# Patient Record
Sex: Female | Born: 1940 | Race: Black or African American | Hispanic: No
Health system: Southern US, Community
[De-identification: ages and names within clinical notes are randomized; demographics above are authoritative.]

## PROBLEM LIST (undated history)

## (undated) HISTORY — PX: TRIGGER FINGER RELEASE: SHX641

## (undated) HISTORY — PX: ROTATOR CUFF REPAIR: SHX139

## (undated) HISTORY — PX: BACK SURGERY: SHX140

## (undated) HISTORY — PX: APPENDECTOMY: SHX54

## (undated) HISTORY — PX: OTHER SURGICAL HISTORY: SHX169

## (undated) HISTORY — PX: CYST EXCISION: SHX5701

## (undated) HISTORY — PX: HIP SURGERY: SHX245

---

## 2004-06-26 ENCOUNTER — Encounter: Admission: RE | Admit: 2004-06-26 | Discharge: 2004-06-26 | Payer: Self-pay | Admitting: Neurosurgery

## 2004-07-20 ENCOUNTER — Encounter: Admission: RE | Admit: 2004-07-20 | Discharge: 2004-07-20 | Payer: Self-pay | Admitting: Orthopaedic Surgery

## 2004-12-31 ENCOUNTER — Encounter: Admission: RE | Admit: 2004-12-31 | Discharge: 2004-12-31 | Payer: Self-pay | Admitting: Orthopaedic Surgery

## 2005-03-14 ENCOUNTER — Ambulatory Visit: Payer: Self-pay | Admitting: Physical Medicine & Rehabilitation

## 2005-03-14 ENCOUNTER — Inpatient Hospital Stay (HOSPITAL_COMMUNITY): Admission: RE | Admit: 2005-03-14 | Discharge: 2005-03-20 | Payer: Self-pay | Admitting: Orthopaedic Surgery

## 2006-09-24 IMAGING — CR DG CHEST 2V
2 series · 2 of 2 positions shown · non-contrast
Comparison: None.

CLINICAL DATA: Preadmission chest x-ray for right hip osteoarthritis.
 CHEST - 2 VIEW ? 03/07/05:

[view not recorded (1 of 2)]
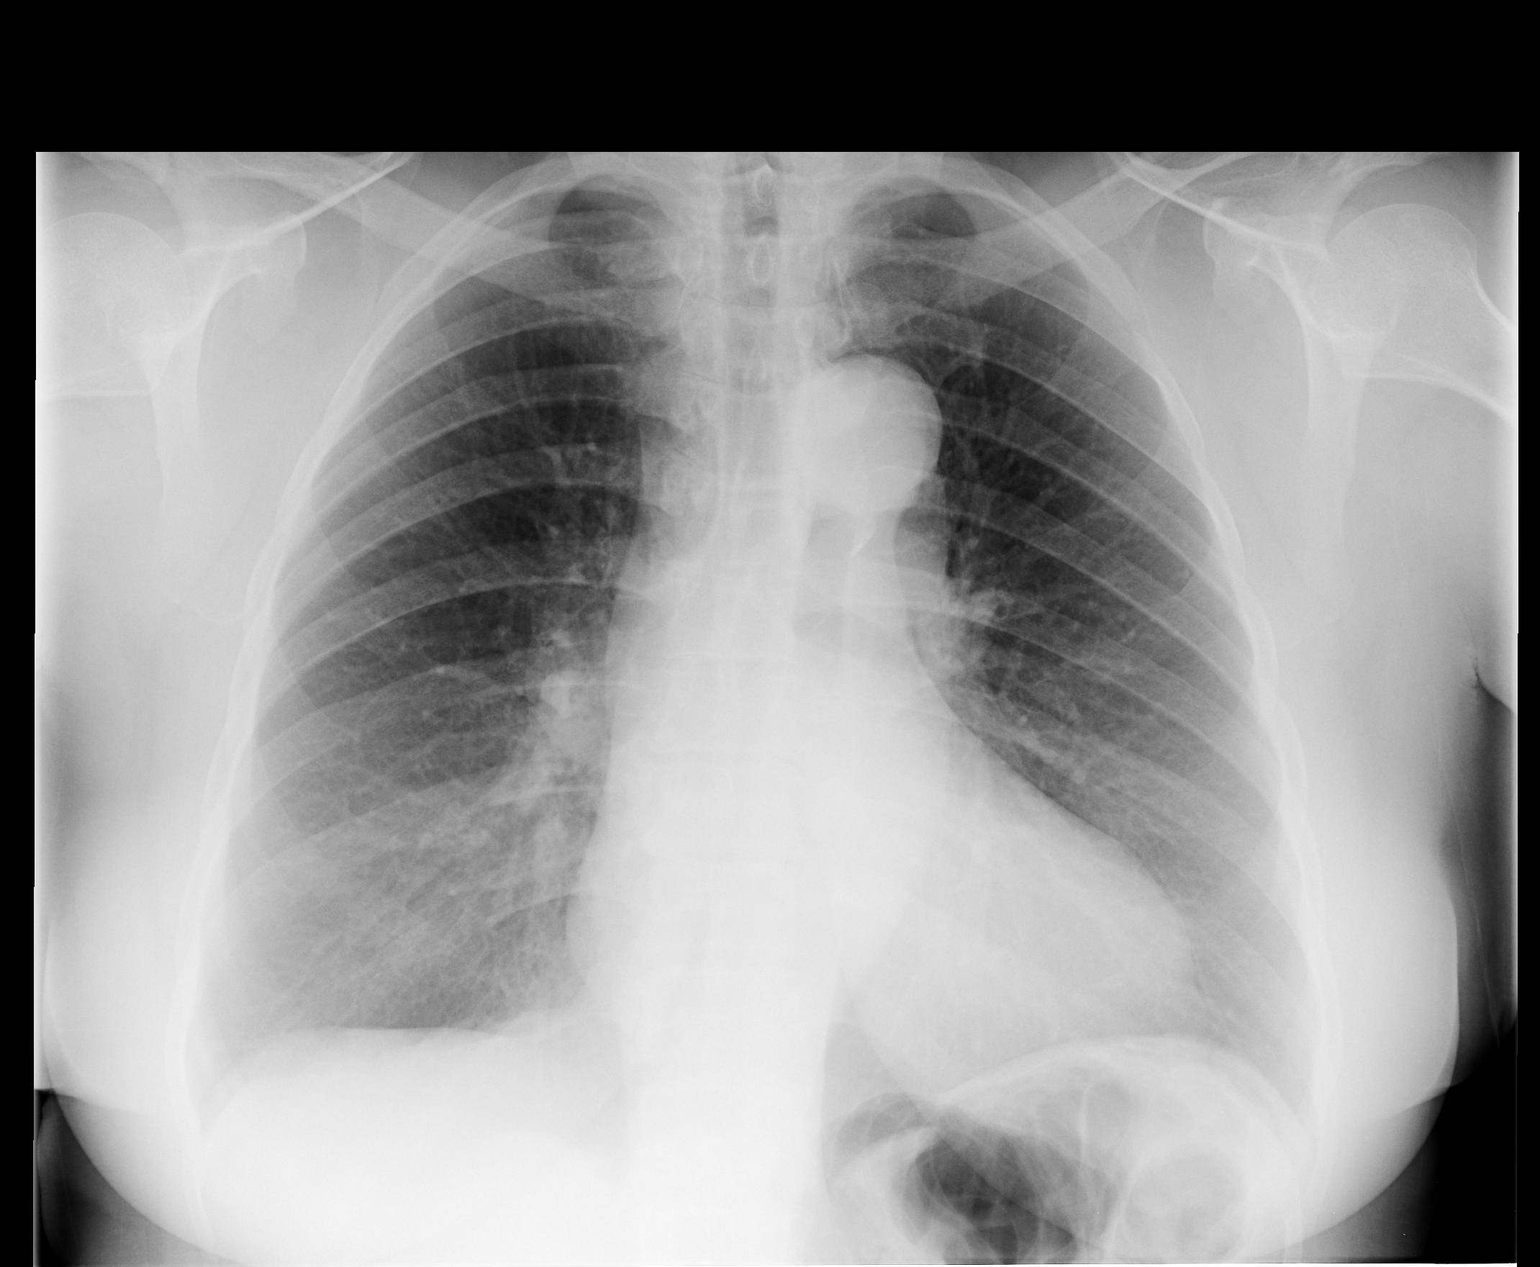

[view not recorded (2 of 2)]
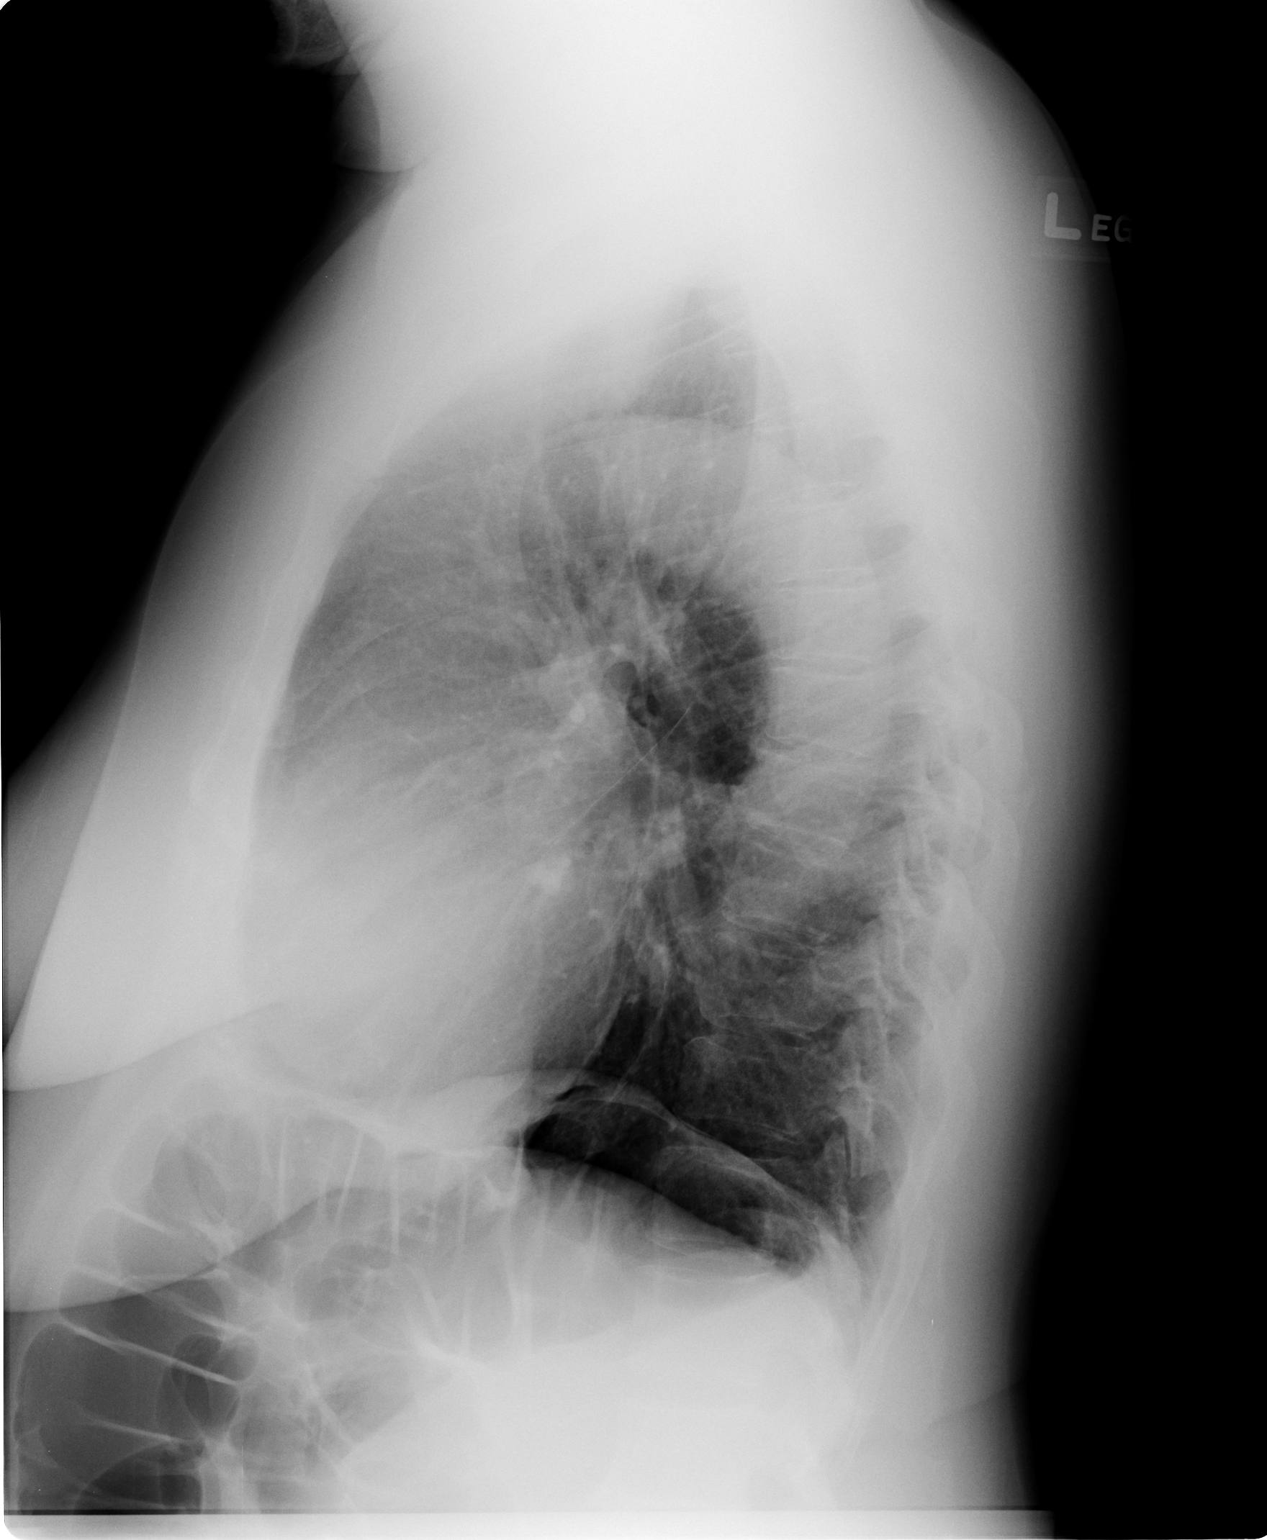

[2 of 2 positions shown; findings below may reference images not displayed]

FINDINGS: The cardiopericardial silhouette is borderline enlarged.  There is diffuse interstitial coarsening without focal consolidation, edema, or pleural effusion.  The bony structures of the visualized thorax are intact.
IMPRESSION: Chronic interstitial changes without focal consolidation or pulmonary edema.

## 2006-10-02 ENCOUNTER — Ambulatory Visit (HOSPITAL_BASED_OUTPATIENT_CLINIC_OR_DEPARTMENT_OTHER): Admission: RE | Admit: 2006-10-02 | Discharge: 2006-10-02 | Payer: Self-pay | Admitting: Orthopaedic Surgery

## 2007-03-13 DIAGNOSIS — G4733 Obstructive sleep apnea (adult) (pediatric): Secondary | ICD-10-CM | POA: Insufficient documentation

## 2007-03-13 DIAGNOSIS — I10 Essential (primary) hypertension: Secondary | ICD-10-CM | POA: Insufficient documentation

## 2007-03-13 DIAGNOSIS — K219 Gastro-esophageal reflux disease without esophagitis: Secondary | ICD-10-CM | POA: Insufficient documentation

## 2007-03-13 DIAGNOSIS — R011 Cardiac murmur, unspecified: Secondary | ICD-10-CM | POA: Insufficient documentation

## 2007-04-13 DIAGNOSIS — R519 Headache, unspecified: Secondary | ICD-10-CM | POA: Insufficient documentation

## 2007-05-05 DIAGNOSIS — J310 Chronic rhinitis: Secondary | ICD-10-CM | POA: Insufficient documentation

## 2007-06-18 DIAGNOSIS — E785 Hyperlipidemia, unspecified: Secondary | ICD-10-CM | POA: Insufficient documentation

## 2007-07-03 DIAGNOSIS — R001 Bradycardia, unspecified: Secondary | ICD-10-CM | POA: Insufficient documentation

## 2007-08-28 DIAGNOSIS — N951 Menopausal and female climacteric states: Secondary | ICD-10-CM | POA: Insufficient documentation

## 2007-10-26 ENCOUNTER — Encounter: Admission: RE | Admit: 2007-10-26 | Discharge: 2007-10-26 | Payer: Self-pay | Admitting: Orthopaedic Surgery

## 2007-11-19 DIAGNOSIS — M25519 Pain in unspecified shoulder: Secondary | ICD-10-CM | POA: Insufficient documentation

## 2008-01-08 ENCOUNTER — Encounter: Admission: RE | Admit: 2008-01-08 | Discharge: 2008-01-08 | Payer: Self-pay | Admitting: Orthopaedic Surgery

## 2009-04-17 ENCOUNTER — Encounter: Admission: RE | Admit: 2009-04-17 | Discharge: 2009-04-17 | Payer: Self-pay | Admitting: Neurosurgery

## 2010-08-21 NOTE — Op Note (Signed)
Jamie [name]           ACCOUNT NO.:  [id number]   MEDICAL RECORD NO.:  [id number]          PATIENT TYPE:  AMB   LOCATION:  DSC                          FACILITY:  [facility]   PHYSICIAN:  Claude Manges. Whitfield, M.D.DATE OF BIRTH:  1941/01/18   DATE OF PROCEDURE:  10/02/2006  DATE OF DISCHARGE:                               OPERATIVE REPORT   PREOPERATIVE DIAGNOSIS:  1. Rotator cuff tear, left shoulder, with impingement.  2. 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   POSTOPERATIVE DIAGNOSIS:  1. Rotator cuff tear, left shoulder, with impingement.  2. 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  3. Osteocartilaginous loose body left shoulder joint and complete tear      of biceps tendon.   PROCEDURE:  1. Arthroscopic debridement left shoulder joint with synovectomy and      removal of loose body.  2. Arthroscopic subacromial decompression.  3. Arthroscopic distal clavicle resection.  4. Mini-open rotator cuff tear repair.   SURGEON:  Claude Manges. Cleophas Dunker, M.D.   ASSISTANT:  Legrand Pitts. Duffy, P.A.-C.   ANESTHESIA:  General with interscalene nerve block.   COMPLICATIONS:  None.   HISTORY:  70 year old female was recently seen in the office with about  a 6-7 month history of left shoulder pain.  There was no history of  injury or trauma.  She had been evaluated in [place], [place],  with an MRI scan in January revealing a complete full thickness tear of  the supraspinatus tendon with retraction, tendinosis of the  infraspinatus tendon, hypertrophic changes of the [facility] joint.  With  persistent discomfort and some weakness, but able to raise her arm  overhead, she is now to have an arthroscopic evaluation and probable  rotator cuff tear repair if repairable.   DESCRIPTION OF PROCEDURE:  With the patient comfortable on the operating  table under general orotracheal anesthesia with a supplemental  interscalene nerve block, the patient was placed in the semi-sitting  position with the shoulder frame.  The left shoulder was then prepped  with DuraPrep from the base of neck circumferentially to below the  elbow.  Sterile draping was performed.  A marking pen was used to  outline the [facility] joint, the coracoid, and the acromion.  At a point 1  fingerbreadth posterior and medial to the angle of the acromion, a small  stab wound was made and the arthroscope easily placed in the shoulder  joint.  There was diffuse synovitis which I debrided through a second  portal established anteriorly with a cannula.  The biceps tendon was not  visible.  I did not see a labral tear.  There was a large loose body  that was about 1 cm in diameter that I was able to retrieve with the  basket forceps.  There was obvious complete rotator cuff tear as I could  visualize the subacromial space.   After further debridement, I then placed the scope in the subacromial  space and the cannula in the subacromial space anteriorly and a third  portal was established  in the lateral subacromial space.  There was  considerable bursal material and beefy red synovial tissue that was  debrided with the ArthroCare wand and the shaver.  There was obvious  anterior overhang of the acromion and anterior inferior acromioplasty  was performed with a 6 mm bur.  There was considerable degenerative  change and hypertrophic changes of the [facility] joint and distal clavicle  resection was performed with a 6 mm bur with a very nice decompression.   With an obvious rotator cuff tear, open exploration was performed.  About 1 1/2 inch incision was made along the anterior aspect of the  shoulder and via sharp dissection, carried down to the subcutaneous  tissue.  Gross bleeders were Bovie coagulated.  The deltoid fascia was  incised along its fibers and separated so that I could visualize the  subacromial space. There was still some bursal material  that I debrided.  The cuff tear was noted. It was in a V-shaped fashion, the tip of the V  at the glenoid then extending down over the humeral head.  There were  numerous hypertrophic osteophytes that were resected with good bleeding  bone.  I then repaired end to and of the cuff with #1 Ethilon.  I had a  very nice repair and there did not appear to be any tension.  The biceps  tendon was not visualized.   I copiously irrigated the operative site with saline.  I checked to be  sure I had a nice subacromial decompression.  I then closed the wound.  The deltoid fascia was closed with a running 0 Vicryl, subcu with 2-0  Vicryl, the skin closed with staples.  A sterile bulky dressing was  applied followed by sling.   PLAN:  Outpatient, Percocet for pain, office one week.      Claude Manges. Cleophas Dunker, M.D.  Electronically Signed     PWW/MEDQ  D:  10/02/2006  T:  10/02/2006  Job:  [id number]
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
## 2010-08-24 NOTE — Discharge Summary (Signed)
Jamie [name], Jamie [name]           ACCOUNT NO.:  [id number]   MEDICAL RECORD NO.:  [id number]          PATIENT TYPE:  INP   LOCATION:  5041                         FACILITY:  [facility]   PHYSICIAN:  Jamie [name]. Whitfield, M.D.DATE OF BIRTH:  [birth year]   DATE OF ADMISSION:  03/14/2005  DATE OF DISCHARGE:  03/20/2005                                 DISCHARGE SUMMARY   ADMISSION DIAGNOSES:  1.  End stage osteoarthritis right hip.  2.  Hypertension.  3.  Hiatal hernia.  4.  Gastroesophageal reflux disease.  5.  Fibromyalgia.  6.  Bilateral carpal tunnel syndrome.  7.  Heart murmur.  8.  Internal and external hemorrhoids.  9.  Broken tooth right lower jaw line.   DISCHARGE DIAGNOSIS:  1.  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  2.  Acute blood loss anemia secondary to surgery.  3.  Pyrexia now resolved.  4.  Constipation.  5.  Hypertension.  6.  Hiatal hernia.  7.  Gastroesophageal reflux disease.  8.  Fibromyalgia.  9.  Bilateral carpal tunnel syndrome.  10. Heart murmur.  11. Internal and external hemorrhoids.  12. Broken tooth right lower jaw line.   SURGICAL PROCEDURES:  March 14, 2005, Ms. Boucher underwent a right total  hip arthroplasty by Dr. Claude [name]. Whitfield, assisted by Dr. Bearl Mulberry  and Jacqualine Code, P.A.-C.  She had a Pinnacle Marathon acetabular liner  plus 4 10 degrees 36 mm inner diameter of 56 mm outer diameter with a  Pinnacle 100 series acetabular cup size 56 mm and apex hole eliminator, an  AML small stature of femoral stem 155 mm length 43 mm offset size 15 with an  Articulose metal on metal femoral head 36 mm, plus 1.5 neck, 12/14 cone.   COMPLICATIONS:  None.   CONSULTATIONS:  1.  Pharmacy consult for Coumadin therapy March 14, 2005.  2.  Case management and physical therapy consult March 15, 2005.  3.  Occupational therapy consult on March 19, 2005.  4.  Rehab medicine consult March 15, 2005.   HISTORY OF PRESENT ILLNESS:  This 70 year old black female patient presented  to Dr. Cleophas [name] with a one year history of sudden onset right hip pain.  It  is now constant aching to stabbing sensation over the right groin and thigh  with radiation to her knee.  It increases with excessive activity and  decreases with rest.  She has failed conservative treatment and because of  that she is presenting for right hip replacement.   HOSPITAL COURSE:  [name] tolerated her surgical procedure well without  immediate postoperative complications.  She was transferred to 5000.  On  postop day one a T-max was 101.4, vitals were stable.  Hemoglobin 11.2,  hematocrit 31.9.  She was started on aggressive pulmonary toilet, vitals  were monitored, and she was started on therapy per protocol.   Postop day two, pain controlled with meds.  T-max 102.1, white count 8.5,  hemoglobin 10.3, hematocrit 29.3.  Right hip incision was well approximated  with staples and minimal  drainage.  She was switched to p.o. pain meds and  continued on therapy.  She did well over the next several days.  Temperature  gradually curved downward.  She did not require any transfusion.  She did  have some difficulty with constipation that was treated with a laxative.  She was doing too well for rehab so plans were made for her discharge home  and she was eventually discharged home on March 20, 2005.   DISCHARGE INSTRUCTIONS:  Diet: She can resume her regular prehospitalization  diet.   MEDICATIONS:  She may resume her prehospitalization meds except no Coumadin.  Home meds included  1.  Lisinopril/hydrochlorothiazide 10/12.5 mg p.o. q.p.m..  2.  Plendil 10 mg p.o. q.a.m.Marland Kitchen  3.  Prilosec 20 mg p.o. q.a.m.Marland Kitchen  4.  Mylanta of 15-30 mL p.o. q.4h. p.r.n..  5.  Garleke one tablet p.o. q.a.m..  #.  Calcium citrate plus D one tablet p.o. q.a.m.  1.  Vitamin C/Rose hips 500 mg p.o. q.a.m..  2.  One Source 50 Plus one tablet  p.o. q.a.m.Marland Kitchen  3.  Lesafen one tablet p.o. q.a.m.  Additional meds at this time include  1.  Coumadin taken as directed by pharmacy at 6:00 p.m. for one she was      given 10 mg for December 14 and as directed by the pharmacy and she was      given prescription for 5 mg tablets.  2.  Percocet 5/325 mg 1-2 p.o. q.4h. p.r.n. for pain.  3.  Robaxin 500 mg 1 tablet p.o. q.6-8h. p.r.n. for spasms.   ACTIVITY:  She can be out of bed partial weightbearing 50% or less on the  right leg with use of walker.  She is to have PT per [facility].  Please see the blue total hip discharge sheet for further activity  instructions.   WOUND CARE:  She may shower after no drainage from the wound for two days.  Please see the blue total hip discharge sheet for further wound care  instructions.   FOLLOW UP:  She needs to follow up with Dr. Cleophas [name] in our office in  approximately 2 weeks and needs to call [phone] for that appointment.   LABORATORY DATA:  X-ray taken of the right hip after surgery on December 7  showed good position of the right hip replacement.  Hemoglobin and  hematocrit ranged from 13.5 and 39.2 on November 30 to a low of 8.7 and 24.8  on the December 11 to 9.5 and 27.8 on March 19, 2005.  PT and INR ranged  from 14 and 1.1 on November  30 to 15 and 1.2 on December 13.  The sodium dropped to a low of 133 on  December 9, otherwise it was within normal limits.  Glucose ranged from 104  on November 30 to a high of 159 on December 9.  Calcium ranged from 9.7 on  November 30 to 8.3 on December 9.  All other laboratory studies were within  normal limits.      Jamie [name], P.A.      Jamie [name]. Jamie [name], M.D.  Electronically Signed    KED/MEDQ  D:  06/06/2005  T:  06/06/2005  Job:  [id number]

## 2010-08-24 NOTE — Op Note (Signed)
[name], [name]           ACCOUNT NO.:  [id number]   MEDICAL RECORD NO.:  [id number]          PATIENT TYPE:  INP   LOCATION:  2899                         FACILITY:  [facility]   PHYSICIAN:  Jamie [name], M.D.DATE OF BIRTH:  [birth year]   DATE OF PROCEDURE:  03/14/2005  DATE OF DISCHARGE:                                 OPERATIVE REPORT   PREOPERATIVE DIAGNOSIS:  Osteoarthritis of the right hip.   POSTOPERATIVE DIAGNOSIS:  Osteoarthritis of the right hip.   PROCEDURE:  Right total hip replacement.   SURGEON:  Jamie [name], M.D.   ASSISTANT:  Jamie [name], M.D.  Jamie [name], P.A.-C.   ANESTHESIA:  General orotracheal.   COMPLICATIONS:  None.   COMPONENTS:  DePuy AML small stature 15 mm femoral component, a 36 mm ball  with a 1.5 mm neck length, 56 mm outer diameter 100 series acetabular  component with a plus 4 liner and 10 degree posterior lip and an apex hole  eliminator.  All components were press fit.   PROCEDURE:  With the patient comfortable on the operating table and under  general orotracheal anesthesia, the nursing staff inserted a Foley catheter.  The patient was then placed in the lateral decubitus position with the right  side up and secured to the operating table with the Innomed Hip System.  The  right hip was then prepped with Betadine scrub and DuraPrep from the iliac  crest to the calf, sterile draping was performed.  A routine Southern  incision was utilized and using sharp dissection carried down to the  subcutaneous tissue.  There was probably 3-4 inches of adipost tissue that  was incised and coagulation performed with the Bovie.  Self-retaining  retractors were inserted.  The iliotibial band was identified and incised  along the incision.  Short external rotators were identified and incised  with the Bovie from their posterior attachment to the greater trochanter.  The capsule was identified and incised on the femoral neck  and head.  The  head was then subluxed, we could not completely dislocate the head, so I  osteotomized the head at the femoral head femoral neck  junction.  Because  of the central ligament, we had difficulty removing the head and,  accordingly, used a corkscrew which then easily removed the head.  The head  was devoid of articular cartilage.  There was evidence of beefy red synovium  and about 1-2 mL clear joint effusion.  Retractors were then placed about  the proximal femur.  A starter hole was then made in the piriformis fossa.  The canal finder was then inserted.  Reaming was performed to 14.5 mm to  accept a 15 mm prosthesis.  Rasping was then performed sequentially from a  10.5, 12, 13.5, then eventually a 15 mm short stature femoral component.  The calcar reamer was used to obtain the appropriate calcar angle.  The  retractor was then placed about the acetabulum.  The labrum was sharply  excised.  Reaming was then performed at 55 mm to accept a 56 mm outer  diameter prosthesis.  We trialed several  sizes and felt the 56 would be  perfect.  The 56 mm outer diameter 100 series acetabular component was then  impacted.  The trial acetabular component was then inserted.  We initially  trialed the 32 mm ball with the plus 1 neck length and felt that we had  posterior instability.  We tried the next sized neck length and felt that  the legs were too long.  Accordingly, the femoral component was removed,  femoral ball was removed, and the trial acetabulum removed, and we  repositioned the acetabulum in slightly more abduction and flexion.  We had  perfect stability and nice coverage.  We decided to use a 36 mm ball as it  probably would be more stable.  Accordingly, the plus 4 trial acetabular  liner with the 10 degree posterior lip was then screwed in place.  The 36 mm  hip ball with the minus 2 neck length was then inserted and thought this was  just a little too much toggling.  So, then  we inserted the 36 mm hip ball  with the 1.5 mm neck length.  This was reduced with perfect stability in all  planes.  We felt the leg lengths were still symmetrical and possibly  slightly longer but probably no more than 1/8 of an inch.  With the stable  construct, the trial components were removed.  The joint was then copiously  irrigated with saline solution.  The apex hole eliminator was inserted  followed by the impacted plus 4 polyethylene component.  The 15 mm small  stature femoral component was then impacted flush around the calcar.  The 36  mm hip ball with the 1.5 mm neck length was then applied and impacted in  place.  The entire construct was then reduced until full range of motion was  slightly tight, but could extend the knee, there was no dislocation in  flexion or extension.  Again, leg lengths appeared to be symmetrical and  possibly just slightly longer.  The wound was again irrigated with saline  solution.  The capsule was closed anatomically with a #1 Ethibond, the short  external rotators were closed with the same material.  The iliotibial band  was closed with running 0 Vicryl, the subcu closed in several layers with 0  and 2-0 Vicryl, and the skin was closed with skin clips.  A sterile bulky  dressing was applied followed by a knee immobilizer.  The patient tolerated  the procedure well without complications and returned to the post anesthesia  recovery room.      Jamie [name], M.D.  Electronically Signed     PWW/MEDQ  D:  03/14/2005  T:  03/14/2005  Job:  [id number]

## 2011-01-23 LAB — BASIC METABOLIC PANEL
BUN: 15
CO2: 28
Calcium: 9.6
Chloride: 104
Creatinine, Ser: 0.78
GFR calc Af Amer: >60
GFR calc non Af Amer: >60
Glucose, Bld: 121 — ABNORMAL HIGH
Potassium: 3.6
Sodium: 139

## 2011-01-23 LAB — POCT HEMOGLOBIN-HEMACUE
Hemoglobin: 12.5
Operator id: 128471

## 2013-12-02 DIAGNOSIS — I739 Peripheral vascular disease, unspecified: Secondary | ICD-10-CM | POA: Insufficient documentation

## 2016-05-13 DIAGNOSIS — Z7189 Other specified counseling: Secondary | ICD-10-CM | POA: Insufficient documentation

## 2016-09-11 DIAGNOSIS — Z79899 Other long term (current) drug therapy: Secondary | ICD-10-CM | POA: Insufficient documentation

## 2016-10-29 DIAGNOSIS — I951 Orthostatic hypotension: Secondary | ICD-10-CM | POA: Insufficient documentation

## 2017-03-26 DIAGNOSIS — M4716 Other spondylosis with myelopathy, lumbar region: Secondary | ICD-10-CM | POA: Insufficient documentation

## 2018-01-16 DIAGNOSIS — M5442 Lumbago with sciatica, left side: Secondary | ICD-10-CM | POA: Insufficient documentation

## 2018-01-16 DIAGNOSIS — G8929 Other chronic pain: Secondary | ICD-10-CM | POA: Insufficient documentation

## 2018-01-16 DIAGNOSIS — M5432 Sciatica, left side: Secondary | ICD-10-CM | POA: Insufficient documentation

## 2019-08-17 DIAGNOSIS — S81802A Unspecified open wound, left lower leg, initial encounter: Secondary | ICD-10-CM | POA: Insufficient documentation

## 2019-08-17 DIAGNOSIS — R52 Pain, unspecified: Secondary | ICD-10-CM | POA: Insufficient documentation

## 2019-08-17 DIAGNOSIS — Z79899 Other long term (current) drug therapy: Secondary | ICD-10-CM | POA: Insufficient documentation

## 2019-08-30 DIAGNOSIS — E876 Hypokalemia: Secondary | ICD-10-CM | POA: Insufficient documentation

## 2019-08-30 DIAGNOSIS — N183 Chronic kidney disease, stage 3 unspecified: Secondary | ICD-10-CM | POA: Insufficient documentation

## 2019-08-30 DIAGNOSIS — R079 Chest pain, unspecified: Secondary | ICD-10-CM | POA: Insufficient documentation

## 2019-12-31 ENCOUNTER — Encounter (HOSPITAL_BASED_OUTPATIENT_CLINIC_OR_DEPARTMENT_OTHER): Payer: Self-pay | Admitting: Internal Medicine

## 2020-01-26 ENCOUNTER — Encounter (HOSPITAL_BASED_OUTPATIENT_CLINIC_OR_DEPARTMENT_OTHER): Payer: Self-pay | Admitting: Physician Assistant

## 2020-03-24 DIAGNOSIS — L97322 Non-pressure chronic ulcer of left ankle with fat layer exposed: Secondary | ICD-10-CM | POA: Insufficient documentation

## 2020-03-24 DIAGNOSIS — I83023 Varicose veins of left lower extremity with ulcer of ankle: Secondary | ICD-10-CM | POA: Insufficient documentation

## 2020-06-30 ENCOUNTER — Encounter (HOSPITAL_BASED_OUTPATIENT_CLINIC_OR_DEPARTMENT_OTHER): Payer: Medicare Other | Attending: Internal Medicine | Admitting: Internal Medicine

## 2020-06-30 ENCOUNTER — Other Ambulatory Visit: Payer: Self-pay

## 2020-06-30 DIAGNOSIS — G4730 Sleep apnea, unspecified: Secondary | ICD-10-CM | POA: Diagnosis not present

## 2020-06-30 DIAGNOSIS — L97922 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: Secondary | ICD-10-CM | POA: Insufficient documentation

## 2020-06-30 DIAGNOSIS — I10 Essential (primary) hypertension: Secondary | ICD-10-CM | POA: Insufficient documentation

## 2020-06-30 DIAGNOSIS — Z91041 Radiographic dye allergy status: Secondary | ICD-10-CM | POA: Diagnosis not present

## 2020-06-30 DIAGNOSIS — Z87891 Personal history of nicotine dependence: Secondary | ICD-10-CM | POA: Insufficient documentation

## 2020-06-30 DIAGNOSIS — Z888 Allergy status to other drugs, medicaments and biological substances status: Secondary | ICD-10-CM | POA: Diagnosis not present

## 2020-06-30 DIAGNOSIS — I87332 Chronic venous hypertension (idiopathic) with ulcer and inflammation of left lower extremity: Secondary | ICD-10-CM | POA: Insufficient documentation

## 2020-06-30 DIAGNOSIS — M1990 Unspecified osteoarthritis, unspecified site: Secondary | ICD-10-CM | POA: Diagnosis not present

## 2020-06-30 DIAGNOSIS — G629 Polyneuropathy, unspecified: Secondary | ICD-10-CM | POA: Diagnosis not present

## 2020-06-30 DIAGNOSIS — Z886 Allergy status to analgesic agent status: Secondary | ICD-10-CM | POA: Diagnosis not present

## 2020-07-03 NOTE — Progress Notes (Signed)
Jamie [name], Jamie W. ([id number]) Visit Report for 06/30/2020 Chief Complaint Document Details Patient Name: Date of Service: Jamie [name], Jamie RGEURITE W. 06/30/2020 1:15 PM Medical Record Number: [id number] Patient Account Number: [id number] Date of Birth/Sex: Treating RN: 10/30/1940 (80 y.o. Female) Antonieta IbaBarnhart, Jodi Primary Care Provider: Hiram GashEGGLESTO [name]-CLA RK, Lorenso QuarryV A LENICA Other Clinician: Referring Provider: Treating Provider/Extender: Juliet Rudeobson, Dorla Guizar Cathey, Reginald Weeks in Treatment: 0 Information Obtained from: Patient Chief Complaint 06/30/2020; patient is here for review of wounds centered on her left medial lower leg and ankle Electronic Signature(s) Signed: 07/03/2020 9:50:14 AM By: Baltazar Najjarobson, Culver Feighner MD Entered By: Baltazar Najjarobson, Lorenzo Arscott on 06/30/2020 16:46:42 -------------------------------------------------------------------------------- Debridement Details Patient Name: Date of Service: Jamie [name], Jamie DittoMA RGEURITE W. 06/30/2020 1:15 PM Medical Record Number: [id number] Patient Account Number: [id number] Date of Birth/Sex: Treating RN: 09/01/1940 (80 y.o. Female) Antonieta IbaBarnhart, Jodi Primary Care Provider: Hiram GashEGGLESTO [name]-CLA RK, Lorenso QuarryV A LENICA Other Clinician: Referring Provider: Treating Provider/Extender: Juliet Rudeobson, Anatalia Kronk Cathey, Reginald Weeks in Treatment: 0 Debridement Performed for Assessment: Wound #4 Left,Posterior Lower Leg Performed By: Physician Maxwell Caulobson, Akiyah Eppolito G., MD Debridement Type: Debridement Severity of Tissue Pre Debridement: Fat layer exposed Level of Consciousness (Pre-procedure): Awake and Alert Pre-procedure Verification/Time Out Yes - 15:25 Taken: Start Time: 15:32 Pain Control: Other : Benzocaine T Area Debrided (L x W): otal 1 (cm) x 1 (cm) = 1 (cm) Tissue and other material debrided: Non-Viable, Slough, Subcutaneous, Slough Level: Skin/Subcutaneous Tissue Debridement Description: Excisional Instrument: Curette Bleeding: Minimum Hemostasis Achieved: Pressure End Time:  15:34 Response to Treatment: Procedure was tolerated well Level of Consciousness (Post- Awake and Alert procedure): Post Debridement Measurements of Total Wound Length: (cm) 1 Width: (cm) 1.8 Depth: (cm) 0.2 Volume: (cm) 0.283 Character of Wound/Ulcer Post Debridement: Stable Severity of Tissue Post Debridement: Fat layer exposed Post Procedure Diagnosis Same as Pre-procedure Electronic Signature(s) Signed: 06/30/2020 6:16:52 PM By: Antonieta IbaBarnhart, Jodi Signed: 07/03/2020 9:50:14 AM By: Baltazar Najjarobson, Bethlehem Langstaff MD Entered By: Antonieta IbaBarnhart, Jodi on 06/30/2020 15:39:10 -------------------------------------------------------------------------------- Debridement Details Patient Name: Date of Service: Jamie [name], Jamie DittoMA RGEURITE W. 06/30/2020 1:15 PM Medical Record Number: [id number] Patient Account Number: [id number] Date of Birth/Sex: Treating RN: 11/08/1940 (80 y.o. Female) Antonieta IbaBarnhart, Jodi Primary Care Provider: Hiram GashEGGLESTO [name]-CLA RK, Lorenso QuarryV A LENICA Other Clinician: Referring Provider: Treating Provider/Extender: Juliet Rudeobson, Brandonn Capelli Cathey, Reginald Weeks in Treatment: 0 Debridement Performed for Assessment: Wound #1 Left,Proximal,Medial Lower Leg Performed By: Physician Maxwell Caulobson, Jaslynn Thome G., MD Debridement Type: Debridement Severity of Tissue Pre Debridement: Fat layer exposed Level of Consciousness (Pre-procedure): Awake and Alert Pre-procedure Verification/Time Out Yes - 15:25 Taken: Start Time: 15:28 Pain Control: Other : Benzocaine T Area Debrided (L x W): otal 0.8 (cm) x 0.8 (cm) = 0.64 (cm) Tissue and other material debrided: Non-Viable, Slough, Subcutaneous, Slough Level: Skin/Subcutaneous Tissue Debridement Description: Excisional Instrument: Curette Bleeding: Minimum Hemostasis Achieved: Pressure End Time: 15:30 Response to Treatment: Procedure was tolerated well Level of Consciousness (Post- Awake and Alert procedure): Post Debridement Measurements of Total Wound Length: (cm) 1.8 Width: (cm)  1.8 Depth: (cm) 0.1 Volume: (cm) 0.254 Character of Wound/Ulcer Post Debridement: Stable Severity of Tissue Post Debridement: Fat layer exposed Post Procedure Diagnosis Same as Pre-procedure Electronic Signature(s) Signed: 06/30/2020 6:16:52 PM By: Antonieta IbaBarnhart, Jodi Signed: 07/03/2020 9:50:14 AM By: Baltazar Najjarobson, Raylea Adcox MD Entered By: Baltazar Najjarobson, Len Kluver on 06/30/2020 16:44:50 -------------------------------------------------------------------------------- Debridement Details Patient Name: Date of Service: Jamie [name], Jamie DittoMA RGEURITE W. 06/30/2020 1:15 PM Medical Record Number: [id number] Patient Account Number: [id number] Date of Birth/Sex: Treating RN: 08/17/1940 (80 y.o. Female) Antonieta IbaBarnhart, Jodi Primary Care Provider: Vida RollerEGGLESTO [name]-CLA RK, V A  LENICA Other Clinician: Referring Provider: Treating Provider/Extender: Juliet Rude in Treatment: 0 Debridement Performed for Assessment: Wound #2 Left,Medial Lower Leg Performed By: Physician Maxwell Caul., MD Debridement Type: Debridement Severity of Tissue Pre Debridement: Fat layer exposed Level of Consciousness (Pre-procedure): Awake and Alert Pre-procedure Verification/Time Out Yes - 15:25 Taken: Start Time: 15:26 Pain Control: Other : Benzocaine T Area Debrided (L x W): otal 1.8 (cm) x 1.6 (cm) = 2.88 (cm) Tissue and other material debrided: Non-Viable, Slough, Subcutaneous, Slough Level: Skin/Subcutaneous Tissue Debridement Description: Excisional Instrument: Curette Bleeding: Minimum Hemostasis Achieved: Pressure End Time: 15:28 Response to Treatment: Procedure was tolerated well Level of Consciousness (Post- Awake and Alert procedure): Post Debridement Measurements of Total Wound Length: (cm) 1.8 Width: (cm) 1.6 Depth: (cm) 0.2 Volume: (cm) 0.452 Character of Wound/Ulcer Post Debridement: Stable Severity of Tissue Post Debridement: Fat layer exposed Post Procedure Diagnosis Same as  Pre-procedure Electronic Signature(s) Signed: 06/30/2020 6:16:52 PM By: [name] Signed: 07/03/2020 9:50:14 AM By: [name] MD Entered By: [name] on 06/30/2020 16:44:59 -------------------------------------------------------------------------------- Debridement Details Patient Name: Date of Service: Jamie [name], Jamie Ditto W. 06/30/2020 1:15 PM Medical Record Number: [id number] Patient Account Number: [id number] Date of Birth/Sex: Treating RN: [birth year] (80 y.o. Female) [name] Primary Care Provider: Hiram Gash RK, Lorenso Quarry Other Clinician: Referring Provider: Treating Provider/Extender: Juliet Rude in Treatment: 0 Debridement Performed for Assessment: Wound #3 Left,Distal,Medial Lower Leg Performed By: Physician Maxwell Caul., MD Debridement Type: Debridement Severity of Tissue Pre Debridement: Fat layer exposed Level of Consciousness (Pre-procedure): Awake and Alert Pre-procedure Verification/Time Out Yes - 15:25 Taken: Start Time: 15:30 Pain Control: Other : Benzocaine T Area Debrided (L x W): otal 4.1 (cm) x 2.2 (cm) = 9.02 (cm) Tissue and other material debrided: Non-Viable, Slough, Subcutaneous, Slough Level: Skin/Subcutaneous Tissue Debridement Description: Excisional Instrument: Curette Bleeding: Minimum Hemostasis Achieved: Pressure End Time: 15:32 Response to Treatment: Procedure was tolerated well Level of Consciousness (Post- Awake and Alert procedure): Post Debridement Measurements of Total Wound Length: (cm) 4.1 Width: (cm) 2.2 Depth: (cm) 0.2 Volume: (cm) 1.417 Character of Wound/Ulcer Post Debridement: Stable Severity of Tissue Post Debridement: Fat layer exposed Post Procedure Diagnosis Same as Pre-procedure Electronic Signature(s) Signed: 06/30/2020 6:16:52 PM By: [name] Signed: 07/03/2020 9:50:14 AM By: [name] MD Entered By: [name] on 06/30/2020  16:45:09 -------------------------------------------------------------------------------- HPI Details Patient Name: Date of Service: Jamie [name], Jamie Ditto W. 06/30/2020 1:15 PM Medical Record Number: [id number] Patient Account Number: [id number] Date of Birth/Sex: Treating RN: Sep 01, 1940 (80 y.o. Female) [name] Primary Care Provider: Marlowe Alt Other Clinician: Referring Provider: Treating Provider/Extender: Juliet Rude in Treatment: 0 History of Present Illness HPI Description: ADMISSION 06/30/2020 Jamie [name] is an 80 year old woman who lives in [place]. She is here with her niece for review of wounds on the left medial lower leg and ankle. These have apparently been present for over a year and she followed with Dr. Olegario Messier at the [facility] in [place] for quite a period of time although it looks as though there was a initial consult wound from Dr. Marcha Solders on February 18 presumably there was therefore hiatus. At that point the wounds were described as being there for 3 months. She also tells me she was at the wound care center in [place] for a period of time with this. 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 She had  venous studies that were negative for DVT ABIs on the right were 1.01 on the left 1.06. She has . had previous applications of puraply, compression which she does not tolerate very well. She has had several rounds of oral antibiotic therapy. She complains of unrelenting pain and she is seeing Dr. Reece Agar V of pain management apparently was on oxycodone but that did not help. She also had a skin biopsy done by Dr. Olegario Messier although we do not have that result. She does not appear to have an arterial issue. I am not completely clear what she has been putting on the wounds lately. Electronic Signature(s) Signed: 07/03/2020 9:50:14 AM By: [name] MD Entered By: [name] on 06/30/2020 16:50:23 -------------------------------------------------------------------------------- Physical Exam Details Patient Name: Date of Service: Jamie [name], Jamie Sleeper. 06/30/2020 1:15 PM Medical Record Number: [id number] Patient Account Number: [id number] Date of Birth/Sex: Treating RN: May 15, 1940 (80 y.o. Female) [name] Primary Care Provider: Hiram Gash RK, Lorenso Quarry Other Clinician: Referring Provider: Treating Provider/Extender: Juliet Rude in Treatment: 0 Cardiovascular Popliteal and femoral pulses palpable. Pulses are palpable on the left. Slight pitting edema in the left lower leg. Significant inflammation around the wound areas on the medial lower leg and ankle. Integumentary (Hair, Skin) No tenderness around the wounds. Notes Wound exam; the patient essentially has 4 open areas in close juxtaposition on the medial left lower leg and one on the Achilles which she said was a tape injury from one of her Puraply applications. Completely nonviable surface under illumination. I used a #5 curette to gently debride these with great difficulty because of pain. Nevertheless I was able to get to a better looking surface. She does not have surrounding wound tenderness. There is edema in this area. Surrounding skin looks like chronic stasis dermatitis Electronic Signature(s) Signed: 07/03/2020 9:50:14 AM By: [name] MD Entered By: [name] on 06/30/2020 17:11:19 -------------------------------------------------------------------------------- Physician Orders Details Patient Name: Date of Service: Jamie [name], Jamie Ditto W. 06/30/2020 1:15 PM Medical Record Number: [id number] Patient Account Number: [id number] Date of Birth/Sex: Treating RN: [birth year] (80 y.o. Female) [name] Primary Care Provider: Hiram Gash RK, Lorenso Quarry Other Clinician: Referring Provider: Treating Provider/Extender: Juliet Rude in Treatment: 0 Verbal / Phone Orders: No Diagnosis Coding Follow-up Appointments Return Appointment in 1 week. Bathing/ Shower/ Hygiene May shower with protection but do not get wound dressing(s) wet. Additional Orders / Instructions Follow Nutritious Diet Home Health Other Home Health Orders/Instructions: - [facility] Wound Treatment Wound #1 - Lower Leg Wound Laterality: Left, Medial, Proximal Cleanser: Soap and Water 1 x Per Week Discharge Instructions: May shower and wash wound with dial antibacterial soap and water prior to dressing change. Cleanser: Wound Cleanser 1 x Per Week Discharge Instructions: Cleanse the wound with wound cleanser prior to applying a clean dressing using gauze sponges, not tissue or cotton balls. Peri-Wound Care: Triamcinolone 15 (g) 1 x Per Week Discharge Instructions: Apply to red, irritated periwound Topical: Skintegrity Hydrogel 4 (oz) 1 x Per Week Discharge Instructions: Apply hydrogel to wound Prim Dressing: Cutimed Sorbact Swab 1 x Per Week ary Discharge Instructions: Apply to wound with Hydrogel Secondary Dressing: Woven Gauze Sponge, Non-Sterile 4x4 in 1 x Per Week Discharge Instructions: Apply over primary dressing as directed. Secondary Dressing: ABD Pad, 8x10 1 x Per Week Discharge Instructions: Apply over primary dressing as directed. Compression Wrap: ThreePress (3 layer compression wrap) 1 x Per Week Discharge Instructions: Apply three  layer compression as directed. Wound #2 - Lower Leg Wound Laterality: Left, Medial Cleanser: Soap and Water 1 x Per Week Discharge Instructions: May shower and wash wound with dial antibacterial soap and water prior to dressing change. Cleanser: Wound Cleanser 1 x Per Week Discharge Instructions: Cleanse the wound with wound cleanser prior to applying a clean dressing using gauze sponges, not tissue or cotton balls. Peri-Wound Care: Triamcinolone 15 (g) 1 x Per  Week Discharge Instructions: Apply to red, irritated periwound Topical: Skintegrity Hydrogel 4 (oz) 1 x Per Week Discharge Instructions: Apply hydrogel to wound Prim Dressing: Cutimed Sorbact Swab 1 x Per Week ary Discharge Instructions: Apply to wound with Hydrogel Secondary Dressing: Woven Gauze Sponge, Non-Sterile 4x4 in 1 x Per Week Discharge Instructions: Apply over primary dressing as directed. Secondary Dressing: ABD Pad, 8x10 1 x Per Week Discharge Instructions: Apply over primary dressing as directed. Compression Wrap: ThreePress (3 layer compression wrap) 1 x Per Week Discharge Instructions: Apply three layer compression as directed. Wound #3 - Lower Leg Wound Laterality: Left, Medial, Distal Cleanser: Soap and Water 1 x Per Week Discharge Instructions: May shower and wash wound with dial antibacterial soap and water prior to dressing change. Cleanser: Wound Cleanser 1 x Per Week Discharge Instructions: Cleanse the wound with wound cleanser prior to applying a clean dressing using gauze sponges, not tissue or cotton balls. Peri-Wound Care: Triamcinolone 15 (g) 1 x Per Week Discharge Instructions: Apply to red, irritated periwound Topical: Skintegrity Hydrogel 4 (oz) 1 x Per Week Discharge Instructions: Apply hydrogel to wound Prim Dressing: Cutimed Sorbact Swab 1 x Per Week ary Discharge Instructions: Apply to wound with Hydrogel Secondary Dressing: Woven Gauze Sponge, Non-Sterile 4x4 in 1 x Per Week Discharge Instructions: Apply over primary dressing as directed. Secondary Dressing: ABD Pad, 8x10 1 x Per Week Discharge Instructions: Apply over primary dressing as directed. Compression Wrap: ThreePress (3 layer compression wrap) 1 x Per Week Discharge Instructions: Apply three layer compression as directed. Wound #4 - Lower Leg Wound Laterality: Left, Posterior Cleanser: Soap and Water 1 x Per Week Discharge Instructions: May shower and wash wound with dial antibacterial  soap and water prior to dressing change. Cleanser: Wound Cleanser 1 x Per Week Discharge Instructions: Cleanse the wound with wound cleanser prior to applying a clean dressing using gauze sponges, not tissue or cotton balls. Peri-Wound Care: Triamcinolone 15 (g) 1 x Per Week Discharge Instructions: Apply to red, irritated periwound Topical: Skintegrity Hydrogel 4 (oz) 1 x Per Week Discharge Instructions: Apply hydrogel to wound Prim Dressing: Cutimed Sorbact Swab 1 x Per Week ary Discharge Instructions: Apply to wound with Hydrogel Secondary Dressing: Woven Gauze Sponge, Non-Sterile 4x4 in 1 x Per Week Discharge Instructions: Apply over primary dressing as directed. Secondary Dressing: ABD Pad, 8x10 1 x Per Week Discharge Instructions: Apply over primary dressing as directed. Compression Wrap: ThreePress (3 layer compression wrap) 1 x Per Week Discharge Instructions: Apply three layer compression as directed. Electronic Signature(s) Signed: 06/30/2020 4:11:04 PM By: [name] Signed: 07/03/2020 9:50:14 AM By: [name] MD Previous Signature: 06/30/2020 2:09:43 PM Version By: [name] Previous Signature: 06/30/2020 2:09:43 PM Version By: [name] Entered By: [name] on 06/30/2020 16:11:03 -------------------------------------------------------------------------------- Problem List Details Patient Name: Date of Service: Jamie [name], Jamie Ditto W. 06/30/2020 1:15 PM Medical Record Number: [id number] Patient Account Number: [id number] Date of Birth/Sex: Treating RN: April 24, 1940 (80 y.o. 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 06/30/2020 1:15 PM Medical Record Number: [id number] Patient Account Number: [id number] Date of Birth/Sex: Treating RN: 04-12-1940 (80 y.o. Female) [name] Primary Care Provider: Hiram Gash RK, Lorenso Quarry Other Clinician: Referring Provider: Treating Provider/Extender: Juliet Rude in Treatment: 0 Subjective Chief Complaint Information obtained from Patient 06/30/2020; patient is here for review of wounds centered on her left medial lower leg and ankle History of Present Illness (HPI) ADMISSION 06/30/2020 Mrs. Lansing is an 80 year old woman who lives in [place]. She is here with her niece for review of wounds on the left medial lower leg and ankle. These have apparently been present for over a year and she followed with Dr. Olegario Messier at the [facility] in [place] for quite a period of time although it looks as though there was a initial consult wound from Dr. Marcha Solders on February 18 presumably there was therefore hiatus. At that point the wounds were described as being there for 3 months. She also tells me she was at the wound care center in [place] for a period of time with this. 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 She had venous studies that were negative for DVT ABIs on the right were 1.01 on the left 1.06. She has . had previous applications of puraply, compression which she does not tolerate very well. She has had several rounds of oral  antibiotic therapy. She complains of unrelenting pain and she is seeing Dr. Reece Agar V of pain management apparently was on oxycodone but that did not help. She also had a skin biopsy done by Dr. Olegario Messier although we do not have that result. She does not appear to have an arterial issue. I am not completely clear what she has been putting on the wounds lately. Patient History Information obtained from Patient. Allergies gabapentin, tizanidine, amlodipine, niacin, aspirin, lisinopril, valsartan Family History Unknown History. Social History Former smoker, Marital Status - Single, Alcohol Use - Never, Drug Use - No History, Caffeine Use - Rarely. Medical History Respiratory Patient has history of Sleep Apnea Cardiovascular Patient has history of Hypertension, Peripheral Venous Disease Musculoskeletal Patient has history of Osteoarthritis Neurologic Patient has history of Neuropathy Medical A Surgical History Notes nd Cardiovascular Heart murmur Psychiatric Anxiety, depression Review of Systems (ROS) Constitutional Symptoms (General Health) Denies complaints or symptoms of Fatigue, Fever, Chills, Marked Weight Change. Eyes Denies complaints or symptoms of Dry Eyes, Vision Changes, Glasses / Contacts. Ear/Nose/Mouth/Throat Denies complaints or symptoms of Chronic sinus problems or rhinitis. Gastrointestinal Denies complaints or symptoms of Frequent diarrhea, Nausea, Vomiting. Endocrine Denies complaints or symptoms of Heat/cold intolerance. Genitourinary Denies complaints or symptoms of Frequent urination. Integumentary (Skin) Complains or has symptoms of Wounds - wounds on left lower leg. Psychiatric Denies complaints or symptoms of Claustrophobia, Suicidal. Objective Constitutional Vitals Time Taken: 2:20 PM, Height: 63 in, Source: Stated, Weight: 150 lbs, Source: Stated, BMI: 26.6, Temperature: 98.3 F, Pulse: 77 bpm, Respiratory Rate: 18 breaths/min,  Blood Pressure: 153/88  mmHg. Cardiovascular Popliteal and femoral pulses palpable. Pulses are palpable on the left. Slight pitting edema in the left lower leg. Significant inflammation around the wound areas on the medial lower leg and ankle. General Notes: Wound exam; the patient essentially has 4 open areas in close juxtaposition on the medial left lower leg and one on the Achilles which she said was a tape injury from one of her Puraply applications. Completely nonviable surface under illumination. I used a #5 curette to gently debride these with great difficulty because of pain. Nevertheless I was able to get to a better looking surface. She does not have surrounding wound tenderness. There is edema in this area. Surrounding skin looks like chronic stasis dermatitis Integumentary (Hair, Skin) No tenderness around the wounds. Wound #1 status is Open. Original cause of wound was Gradually Appeared. The date acquired was: 04/09/2019. The wound is located on the Left,Proximal,Medial Lower Leg. The wound measures 0.8cm length x 0.8cm width x 0.1cm depth; 0.503cm^2 area and 0.05cm^3 volume. There is no tunneling or undermining noted. There is a medium amount of purulent drainage noted. The wound margin is flat and intact. There is no granulation within the wound bed. There is a large (67- 100%) amount of necrotic tissue within the wound bed including Adherent Slough. Wound #2 status is Open. Original cause of wound was Gradually Appeared. The date acquired was: 04/09/2019. The wound is located on the Left,Medial Lower Leg. The wound measures 1.8cm length x 1.6cm width x 0.2cm depth; 2.262cm^2 area and 0.452cm^3 volume. There is Fat Layer (Subcutaneous Tissue) exposed. There is no tunneling or undermining noted. There is a medium amount of serosanguineous drainage noted. The wound margin is flat and intact. There is large (67-100%) red granulation within the wound bed. There is a small (1-33%) amount of necrotic tissue within  the wound bed including Adherent Slough. Wound #3 status is Open. Original cause of wound was Gradually Appeared. The date acquired was: 04/09/2019. The wound is located on the Left,Distal,Medial Lower Leg. The wound measures 4.1cm length x 2.2cm width x 0.2cm depth; 7.084cm^2 area and 1.417cm^3 volume. There is Fat Layer (Subcutaneous Tissue) exposed. There is no tunneling or undermining noted. There is a medium amount of purulent drainage noted. The wound margin is flat and intact. There is large (67-100%) red granulation within the wound bed. There is a small (1-33%) amount of necrotic tissue within the wound bed including Adherent Slough. Wound #4 status is Open. Original cause of wound was Gradually Appeared. The date acquired was: 04/09/2019. The wound is located on the Left,Posterior Lower Leg. The wound measures 1cm length x 1cm width x 0.2cm depth; 0.785cm^2 area and 0.157cm^3 volume. There is Fat Layer (Subcutaneous Tissue) exposed. There is no tunneling or undermining noted. There is a medium amount of purulent drainage noted. The wound margin is flat and intact. There is medium (34- 66%) pink granulation within the wound bed. There is a medium (34-66%) amount of necrotic tissue within the wound bed including Adherent Slough. 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 There was a Excisional Skin/Subcutaneous Tissue Debridement with a total area of 0.64 sq cm performed by Maxwell Caul., MD.  With the following instrument(s): Curette to remove Non-Viable tissue/material. Material removed includes Subcutaneous Tissue and Slough and after  achieving pain control using Other (Benzocaine). No specimens were taken. A time out was conducted at 15:25, prior to the start of the procedure. A Minimum amount of bleeding was controlled with Pressure. The procedure was tolerated well. Post Debridement Measurements: 1.8cm length x 1.8cm width x 0.1cm depth; 0.254cm^3 volume. Character of Wound/Ulcer Post Debridement is stable. Severity of Tissue Post Debridement is: Fat layer exposed. Post procedure Diagnosis Wound #1: Same as Pre-Procedure Pre-procedure diagnosis of Wound #1 is a Venous Leg Ulcer located on the Left,Proximal,Medial Lower Leg . There was a Three Layer Compression Therapy Procedure by [name], RN. Post procedure Diagnosis Wound #1: Same as Pre-Procedure Wound #2 Pre-procedure diagnosis of Wound #2 is a Venous Leg Ulcer located on the Left,Medial Lower Leg .Severity of Tissue Pre Debridement is: Fat layer exposed. There was a Excisional Skin/Subcutaneous Tissue Debridement with a total area of 2.88 sq cm performed by Maxwell Caul., MD. With the following instrument(s): Curette to remove Non-Viable tissue/material. Material removed includes Subcutaneous Tissue and Slough and after achieving pain control using Other (Benzocaine). No specimens were taken. A time out was conducted at 15:25, prior to the start of the procedure. A Minimum amount of bleeding was controlled with Pressure. The procedure was tolerated well. Post Debridement Measurements: 1.8cm length x 1.6cm width x 0.2cm depth; 0.452cm^3 volume. Character of Wound/Ulcer Post Debridement is stable. Severity of Tissue Post Debridement is: Fat layer exposed. Post procedure Diagnosis Wound #2: Same as Pre-Procedure Pre-procedure diagnosis of Wound #2 is a Venous Leg Ulcer located on the Left,Medial Lower Leg . There was a Three Layer Compression Therapy Procedure by [name], RN. Post procedure Diagnosis Wound #2: Same as Pre-Procedure Wound  #3 Pre-procedure diagnosis of Wound #3 is a Venous Leg Ulcer located on the Left,Distal,Medial Lower Leg .Severity of Tissue Pre Debridement is: Fat layer exposed. There was a Excisional Skin/Subcutaneous Tissue Debridement with a total area of 9.02 sq cm performed by Maxwell Caul., MD. With the following instrument(s): Curette to remove Non-Viable tissue/material. Material removed includes Subcutaneous Tissue and Slough and after achieving pain control using Other (Benzocaine). No specimens were taken. A time out was conducted at 15:25, prior to the start of the procedure. A Minimum amount of bleeding was controlled with Pressure. The procedure was tolerated well. Post Debridement Measurements: 4.1cm length x 2.2cm width x 0.2cm depth; 1.417cm^3 volume. Character of Wound/Ulcer Post Debridement is stable. Severity of Tissue Post Debridement is: Fat layer exposed. Post procedure Diagnosis Wound #3: Same as Pre-Procedure Pre-procedure diagnosis of Wound #3 is a Venous Leg Ulcer located on the Left,Distal,Medial Lower Leg . There was a Three Layer Compression Therapy Procedure by [name], RN. Post procedure Diagnosis Wound #3: Same as Pre-Procedure Wound #4 Pre-procedure diagnosis of Wound #4 is a Venous Leg Ulcer located on the Left,Posterior Lower Leg .Severity of Tissue Pre Debridement is: Fat layer exposed. There was a Excisional Skin/Subcutaneous Tissue Debridement with a total area of 1 sq cm performed by Maxwell Caul., MD. With the following instrument(s): Curette to remove Non-Viable tissue/material. Material removed includes Subcutaneous Tissue and Slough and after achieving pain control using Other (Benzocaine). No specimens were taken. A time out was conducted at 15:25, prior to the start of the procedure. A Minimum amount of bleeding was controlled with Pressure. The procedure was tolerated well. Post Debridement Measurements: 1cm length x 1.8cm width  x 0.2cm depth;  0.283cm^3 volume. Character of Wound/Ulcer Post Debridement is stable. Severity of Tissue Post Debridement is: Fat layer exposed. Post procedure Diagnosis Wound #4: Same as Pre-Procedure Pre-procedure diagnosis of Wound #4 is a Venous Leg Ulcer located on the Left,Posterior Lower Leg . There was a Three Layer Compression Therapy Procedure by [name], RN. Post procedure Diagnosis Wound #4: Same as Pre-Procedure Plan Follow-up Appointments: Return Appointment in 1 week. Bathing/ Shower/ Hygiene: May shower with protection but do not get wound dressing(s) wet. Additional Orders / Instructions: Follow Nutritious Diet Home Health: Other Home Health Orders/Instructions: - [facility] WOUND #1: - Lower Leg Wound Laterality: Left, Medial, Proximal Cleanser: Soap and Water 1 x Per Week/ Discharge Instructions: May shower and wash wound with dial antibacterial soap and water prior to dressing change. Cleanser: Wound Cleanser 1 x Per Week/ Discharge Instructions: Cleanse the wound with wound cleanser prior to applying a clean dressing using gauze sponges, not tissue or cotton balls. Peri-Wound Care: Triamcinolone 15 (g) 1 x Per Week/ Discharge Instructions: Apply to red, irritated periwound Topical: Skintegrity Hydrogel 4 (oz) 1 x Per Week/ Discharge Instructions: Apply hydrogel to wound Prim Dressing: Cutimed Sorbact Swab 1 x Per Week/ ary Discharge Instructions: Apply to wound with Hydrogel Secondary Dressing: Woven Gauze Sponge, Non-Sterile 4x4 in 1 x Per Week/ Discharge Instructions: Apply over primary dressing as directed. Secondary Dressing: ABD Pad, 8x10 1 x Per Week/ Discharge Instructions: Apply over primary dressing as directed. Com pression Wrap: ThreePress (3 layer compression wrap) 1 x Per Week/ Discharge Instructions: Apply three layer compression as directed. WOUND #2: - Lower Leg Wound Laterality: Left, Medial Cleanser: Soap and Water 1 x Per  Week/ Discharge Instructions: May shower and wash wound with dial antibacterial soap and water prior to dressing change. Cleanser: Wound Cleanser 1 x Per Week/ Discharge Instructions: Cleanse the wound with wound cleanser prior to applying a clean dressing using gauze sponges, not tissue or cotton balls. Peri-Wound Care: Triamcinolone 15 (g) 1 x Per Week/ Discharge Instructions: Apply to red, irritated periwound Topical: Skintegrity Hydrogel 4 (oz) 1 x Per Week/ Discharge Instructions: Apply hydrogel to wound Prim Dressing: Cutimed Sorbact Swab 1 x Per Week/ ary Discharge Instructions: Apply to wound with Hydrogel Secondary Dressing: Woven Gauze Sponge, Non-Sterile 4x4 in 1 x Per Week/ Discharge Instructions: Apply over primary dressing as directed. Secondary Dressing: ABD Pad, 8x10 1 x Per Week/ Discharge Instructions: Apply over primary dressing as directed. Com pression Wrap: ThreePress (3 layer compression wrap) 1 x Per Week/ Discharge Instructions: Apply three layer compression as directed. WOUND #3: - Lower Leg Wound Laterality: Left, Medial, Distal Cleanser: Soap and Water 1 x Per Week/ Discharge Instructions: May shower and wash wound with dial antibacterial soap and water prior to dressing change. Cleanser: Wound Cleanser 1 x Per Week/ Discharge Instructions: Cleanse the wound with wound cleanser prior to applying a clean dressing using gauze sponges, not tissue or cotton balls. Peri-Wound Care: Triamcinolone 15 (g) 1 x Per Week/ Discharge Instructions: Apply to red, irritated periwound Topical: Skintegrity Hydrogel 4 (oz) 1 x Per Week/ Discharge Instructions: Apply hydrogel to wound Prim Dressing: Cutimed Sorbact Swab 1 x Per Week/ ary Discharge Instructions: Apply to wound with Hydrogel Secondary Dressing: Woven Gauze Sponge, Non-Sterile 4x4 in 1 x Per Week/ Discharge Instructions: Apply over primary dressing as directed. Secondary Dressing: ABD Pad, 8x10 1 x Per  Week/ Discharge Instructions: Apply over primary dressing as directed. Com pression Wrap: ThreePress (3 layer  compression wrap) 1 x Per Week/ Discharge Instructions: Apply three layer compression as directed. WOUND #4: - Lower Leg Wound Laterality: Left, Posterior Cleanser: Soap and Water 1 x Per Week/ Discharge Instructions: May shower and wash wound with dial antibacterial soap and water prior to dressing change. Cleanser: Wound Cleanser 1 x Per Week/ Discharge Instructions: Cleanse the wound with wound cleanser prior to applying a clean dressing using gauze sponges, not tissue or cotton balls. Peri-Wound Care: Triamcinolone 15 (g) 1 x Per Week/ Discharge Instructions: Apply to red, irritated periwound Topical: Skintegrity Hydrogel 4 (oz) 1 x Per Week/ Discharge Instructions: Apply hydrogel to wound Prim Dressing: Cutimed Sorbact Swab 1 x Per Week/ ary Discharge Instructions: Apply to wound with Hydrogel Secondary Dressing: Woven Gauze Sponge, Non-Sterile 4x4 in 1 x Per Week/ Discharge Instructions: Apply over primary dressing as directed. Secondary Dressing: ABD Pad, 8x10 1 x Per Week/ Discharge Instructions: Apply over primary dressing as directed. Com pression Wrap: ThreePress (3 layer compression wrap) 1 x Per Week/ Discharge Instructions: Apply three layer compression as directed. 1. I managed to get some debridement of this area 2. 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 I doubt this is cellulitis 3. In terms of dressings I chose something with the ability to help with ongoing debridement which is Sorbact. I was concerned about using Iodoflex since a significant number of people have ongoing pain from this. She will need ongoing debridement both mechanically and with chosen dressings 4. The patient had a DVT rule out study but I do not think she had venous reflux studies she did not have a DVT superficial or deep. She will likely need full venous reflux studies however I  do not think she can get through this currently because of pain. 5. She does not appear to have an arterial issue 6. She did not have the most obvious chronic venous disease that I have seen but certainly her wounded area looks like this. I would like to see the biopsy that Dr. Marcha Solders did. I spent 45 minutes reviewing this patient's past medical history face-to-face evaluation and preparation of this Electronic Signature(s) Signed: 07/03/2020 9:50:14 AM By: [name] MD Entered By: [name] on 06/30/2020 17:13:54 -------------------------------------------------------------------------------- HxROS Details Patient Name: Date of Service: Jamie [name], Jamie Ditto W. 06/30/2020 1:15 PM Medical Record Number: [id number] Patient Account Number: [id number] Date of Birth/Sex: Treating RN: [birth year] (80 y.o. Female) Zandra Abts Primary Care Provider: Hiram Gash RK, Lorenso Quarry Other Clinician: Referring Provider: Treating Provider/Extender: Juliet Rude in Treatment: 0 Information Obtained From Patient Constitutional Symptoms (General Health) Complaints and Symptoms: Negative for: Fatigue; Fever; Chills; Marked Weight Change Eyes Complaints and Symptoms: Negative for: Dry Eyes; Vision Changes; Glasses / Contacts Ear/Nose/Mouth/Throat Complaints and Symptoms: Negative for: Chronic sinus problems or rhinitis Gastrointestinal Complaints and Symptoms: Negative for: Frequent diarrhea; Nausea; Vomiting Endocrine Complaints and Symptoms: Negative for: Heat/cold intolerance Genitourinary Complaints and Symptoms: Negative for: Frequent urination Integumentary (Skin) Complaints and Symptoms: Positive for: Wounds - wounds on left lower leg Psychiatric Complaints and Symptoms: Negative for: Claustrophobia; Suicidal Medical History: Past Medical History Notes: Anxiety, depression Hematologic/Lymphatic Respiratory Medical History: Positive for: Sleep  Apnea Cardiovascular Medical History: Positive for: Hypertension; Peripheral Venous Disease Past Medical History Notes: Heart murmur Immunological Musculoskeletal Medical History: Positive for: Osteoarthritis Neurologic Medical History: Positive for: Neuropathy Oncologic Immunizations Pneumococcal Vaccine: Received Pneumococcal Vaccination: Yes Implantable Devices None Family and Social History Unknown History: Yes; Former smoker; Marital Status - Single; Alcohol Use:  Never; Drug Use: No History; Caffeine Use: Rarely; Financial Concerns: No; Food, Clothing or Shelter Needs: No; Support System Lacking: No; Transportation Concerns: No Psychologist, prison and probation services) Signed: 07/03/2020 9:50:14 AM By: [name] MD Signed: 07/03/2020 5:28:50 PM By: Zandra Abts RN, BSN Entered By: Zandra Abts on 06/30/2020 14:36:58 -------------------------------------------------------------------------------- SuperBill Details Patient Name: Date of Service: Jamie [name], Jamie Sleeper. 06/30/2020 Medical Record Number: [id number] Patient Account Number: [id number] Date of Birth/Sex: Treating RN: Apr 13, 1940 (80 y.o. 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 I87.332 Chronic venous hypertension (idiopathic) with ulcer and inflammation of left lower extremity Facility Procedures CPT4 Code: [id number] 1 Description: 99213 - WOUND CARE VISIT-LEV 3 EST PT 1042 - DEB SUBQ TISSUE 20 SQ CM/< ICD-10 Diagnosis Description L97.922 Non-pressure chronic ulcer of unspecified part of left lower leg with fat la I87.332 Chronic venous hypertension (idiopathic) with  ulcer and inflammation of left Modifier: 25 yer  exposed lower extremity Quantity: 1 1 Physician Procedures : CPT4 Code Description Modifier [id number] 99204 - WC PHYS LEVEL 4 - NEW PT 25 ICD-10 Diagnosis Description L97.922 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 I87.332 Chronic venous hypertension (idiopathic)  with ulcer and inflammation of left lower extremity Quantity: 1 : [id number] 11042 - WC PHYS SUBQ TISS 20 SQ CM ICD-10 Diagnosis Description L97.922 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

## 2020-07-03 NOTE — Progress Notes (Signed)
Jamie, [name] ([id number]) Visit Report for 06/30/2020 Abuse/Suicide Risk Screen Details Patient Name: Date of Service: Jamie [name], [name] 06/30/2020 1:15 PM Medical Record Number: [id number] Patient Account Number: [id number] Date of Birth/Sex: Treating RN: 1941/04/02 (80 y.o. Female) Zandra Abts Primary Care Cartha Rotert: Hiram Gash RK, Lorenso Quarry Other Clinician: Referring Larson Limones: Treating Shareece Bultman/Extender: Juliet Rude in Treatment: 0 Abuse/Suicide Risk Screen Items Answer ABUSE RISK SCREEN: Has anyone close to you tried to hurt or harm you recentlyo No Do you feel uncomfortable with anyone in your familyo No Has anyone forced you do things that you didnt want to doo No Electronic Signature(s) Signed: 07/03/2020 5:28:50 PM By: Zandra Abts RN, BSN Entered By: Zandra Abts on 06/30/2020 14:37:05 -------------------------------------------------------------------------------- Activities of Daily Living Details Patient Name: Date of Service: Jamie [name]. 06/30/2020 1:15 PM Medical Record Number: [id number] Patient Account Number: [id number] Date of Birth/Sex: Treating RN: 11-11-1940 (80 y.o. Female) Zandra Abts Primary Care Randee Upchurch: Hiram Gash RK, Lorenso Quarry Other Clinician: Referring Dasani Crear: Treating Krisann Mckenna/Extender: Juliet Rude in Treatment: 0 Activities of Daily Living Items Answer Activities of Daily Living (Please select one for each item) Drive Automobile Not Able T Medications ake Completely Able Use T elephone Completely Able Care for Appearance Completely Able Use T oilet Completely Able Bath / Shower Completely Able Dress Self Completely Able Feed Self Completely Able Walk Completely Able Get In / Out Bed Completely Able Housework Need Assistance Prepare Meals Need Assistance Handle Money Completely Able Shop for Self Need Assistance Electronic Signature(s) Signed:  07/03/2020 5:28:50 PM By: Zandra Abts RN, BSN Entered By: Zandra Abts on 06/30/2020 14:37:26 -------------------------------------------------------------------------------- Education Screening Details Patient Name: Date of Service: Jamie Blood W. 06/30/2020 1:15 PM Medical Record Number: [id number] Patient Account Number: [id number] Date of Birth/Sex: Treating RN: Oct 28, 1940 (80 y.o. Female) Zandra Abts Primary Care Kristofer Schaffert: Hiram Gash RK, Lorenso Quarry Other Clinician: Referring Kailen Hinkle: Treating Elenore Wanninger/Extender: Juliet Rude in Treatment: 0 Primary Learner Assessed: Patient Learning Preferences/Education Level/Primary Language Learning Preference: Explanation, Demonstration, Printed Material Highest Education Level: High School Preferred Language: English Cognitive Barrier Language Barrier: No Translator Needed: No Memory Deficit: No Emotional Barrier: No Cultural/Religious Beliefs Affecting Medical Care: No Physical Barrier Impaired Vision: No Impaired Hearing: No Decreased Hand dexterity: No Knowledge/Comprehension Knowledge Level: High Comprehension Level: High Ability to understand written instructions: High Ability to understand verbal instructions: High Motivation Anxiety Level: Calm Cooperation: Cooperative Education Importance: Acknowledges Need Interest in Health Problems: Asks Questions Perception: Coherent Willingness to Engage in Self-Management High Activities: Readiness to Engage in Self-Management High Activities: Electronic Signature(s) Signed: 07/03/2020 5:28:50 PM By: Zandra Abts RN, BSN Entered By: Zandra Abts on 06/30/2020 14:37:51 -------------------------------------------------------------------------------- Fall Risk Assessment Details Patient Name: Date of Service: Jamie [name], [name] 06/30/2020 1:15 PM Medical Record Number: [id number] Patient Account Number: [id number] Date of  Birth/Sex: Treating RN: 09-22-1940 (80 y.o. Female) Zandra Abts Primary Care Nevae Pinnix: Hiram Gash RK, Lorenso Quarry Other Clinician: Referring Ellsworth Waldschmidt: Treating Kaid Seeberger/Extender: Juliet Rude in Treatment: 0 Fall Risk Assessment Items Have you had 2 or more falls in the last 12 monthso 0 Yes Have you had any fall that resulted in injury in the last 12 monthso 0 No FALLS RISK SCREEN History of falling - immediate or within 3 months 25 Yes Secondary diagnosis (Do you have 2 or more medical diagnoseso) 0 No Ambulatory aid None/bed rest/wheelchair/nurse 0 Yes Crutches/cane/walker 0 No  Furniture 0 No Intravenous therapy Access/Saline/Heparin Lock 0 No Gait/Transferring Normal/ bed rest/ wheelchair 0 Yes Weak (short steps with or without shuffle, stooped but able to lift head while walking, may seek 0 No support from furniture) Impaired (short steps with shuffle, may have difficulty arising from chair, head down, impaired 0 No balance) Mental Status Oriented to own ability 0 Yes Electronic Signature(s) Signed: 07/03/2020 5:28:50 PM By: Zandra Abts RN, BSN Entered By: Zandra Abts on 06/30/2020 14:38:09 -------------------------------------------------------------------------------- Foot Assessment Details Patient Name: Date of Service: Jamie [name], [name] 06/30/2020 1:15 PM Medical Record Number: [id number] Patient Account Number: [id number] Date of Birth/Sex: Treating RN: July 22, 1940 (80 y.o. Female) Zandra Abts Primary Care Audia Amick: Hiram Gash RK, Lorenso Quarry Other Clinician: Referring Jamie [name]: Treating Eyanna Mcgonagle/Extender: Juliet Rude in Treatment: 0 Foot Assessment Items Site Locations + = Sensation present, - = Sensation absent, C = Callus, U = Ulcer R = Redness, W = Warmth, M = Maceration, PU = Pre-ulcerative lesion F = Fissure, S = Swelling, D = Dryness Assessment Right: Left: Other Deformity: No  No Prior Foot Ulcer: No No Prior Amputation: No No Charcot Joint: No No Ambulatory Status: Ambulatory Without Help Gait: Steady Electronic Signature(s) Signed: 07/03/2020 5:28:50 PM By: Zandra Abts RN, BSN Entered By: Zandra Abts on 06/30/2020 14:38:55 -------------------------------------------------------------------------------- Nutrition Risk Screening Details Patient Name: Date of Service: Jamie [name]. 06/30/2020 1:15 PM Medical Record Number: [id number] Patient Account Number: [id number] Date of Birth/Sex: Treating RN: [birth year] (80 y.o. Female) Zandra Abts Primary Care Khyron Garno: Hiram Gash RK, Lorenso Quarry Other Clinician: Referring Alexsis Branscom: Treating Orie Baxendale/Extender: Juliet Rude in Treatment: 0 Height (in): Weight (lbs): Body Mass Index (BMI): Nutrition Risk Screening Items Score Screening NUTRITION RISK SCREEN: I have an illness or condition that made me change the kind and/or amount of food I eat 0 No I eat fewer than two meals per day 0 No I eat few fruits and vegetables, or milk products 0 No I have three or more drinks of beer, liquor or wine almost every day 0 No I have tooth or mouth problems that make it hard for me to eat 0 No I don't always have enough money to buy the food I need 0 No I eat alone most of the time 0 No I take three or more different prescribed or over-the-counter drugs a day 1 Yes Without wanting to, I have lost or gained 10 pounds in the last six months 0 No I am not always physically able to shop, cook and/or feed myself 2 Yes Nutrition Protocols Good Risk Protocol Moderate Risk Protocol 0 Provide education on nutrition High Risk Proctocol Risk Level: Moderate Risk Score: 3 Electronic Signature(s) Signed: 07/03/2020 5:28:50 PM By: Zandra Abts RN, BSN Entered By: Zandra Abts on 06/30/2020 14:38:16

## 2020-07-05 NOTE — Progress Notes (Signed)
Jamie [name] ([id number]) Visit Report for 06/30/2020 Allergy List Details Patient Name: Date of Service: Jamie [name], [name] 06/30/2020 1:15 PM Medical Record Number: [id number] Patient Account Number: [id number] Date of Birth/Sex: Treating RN: [birth year] (80 y.o. Female) Zandra Abts Primary Care Nadalee Neiswender: Hiram Gash RK, Lorenso Quarry Other Clinician: Referring Copelan Maultsby: Treating Audrey [name]/Extender: Juliet Rude in Treatment: 0 Allergies Active Allergies gabapentin tizanidine amlodipine niacin aspirin lisinopril valsartan Allergy Notes Electronic Signature(s) Signed: 07/03/2020 5:28:50 PM By: Zandra Abts RN, BSN Entered By: Zandra Abts on 06/30/2020 14:34:58 -------------------------------------------------------------------------------- Arrival Information Details Patient Name: Date of Service: Jamie [name], Jamie Ditto W. 06/30/2020 1:15 PM Medical Record Number: [id number] Patient Account Number: [id number] Date of Birth/Sex: Treating RN: 1940/10/11 (80 y.o. Female) Zandra Abts Primary Care Alif Petrak: Hiram Gash RK, Lorenso Quarry Other Clinician: Referring Tylisa Alcivar: Treating Jaxden Blyden/Extender: Juliet Rude in Treatment: 0 Visit Information Patient Arrived: Wheel Chair Arrival Time: 14:00 Accompanied By: niece Transfer Assistance: None Patient Identification Verified: Yes Secondary Verification Process Completed: Yes Patient Requires Transmission-Based Precautions: No Patient Has Alerts: Yes Patient Alerts: L ABI: 1.06; R ABI: 1.01 Electronic Signature(s) Signed: 07/03/2020 5:28:50 PM By: Zandra Abts RN, BSN Signed: 07/03/2020 5:28:50 PM By: Zandra Abts RN, BSN Entered By: Zandra Abts on 06/30/2020 14:47:06 -------------------------------------------------------------------------------- Clinic Level of Care Assessment Details Patient Name: Date of Service: Jamie [name], Jamie Ditto W. 06/30/2020  1:15 PM Medical Record Number: [id number] Patient Account Number: [id number] Date of Birth/Sex: Treating RN: 16-Aug-1940 (80 y.o. Female) Jamie [name] Primary Care Cordale Manera: Hiram Gash RK, Lorenso Quarry Other Clinician: Referring Zela Sobieski: Treating Keirsten Matuska/Extender: Juliet Rude in Treatment: 0 Clinic Level of Care Assessment Items TOOL 1 Quantity Score X- 1 0 Use when EandM and Procedure is performed on INITIAL visit ASSESSMENTS - Nursing Assessment / Reassessment X- 1 20 General Physical Exam (combine w/ comprehensive assessment (listed just below) when performed on new pt. evals) X- 1 25 Comprehensive Assessment (HX, ROS, Risk Assessments, Wounds Hx, etc.) ASSESSMENTS - Wound and Skin Assessment / Reassessment []  - 0 Dermatologic / Skin Assessment (not related to wound area) ASSESSMENTS - Ostomy and/or Continence Assessment and Care []  - 0 Incontinence Assessment and Management []  - 0 Ostomy Care Assessment and Management (repouching, etc.) PROCESS - Coordination of Care []  - 0 Simple Patient / Family Education for ongoing care X- 1 20 Complex (extensive) Patient / Family Education for ongoing care X- 1 10 Staff obtains , Records, T Results / Process Orders est X- 1 10 Staff telephones HHA, Nursing Homes / Clarify orders / etc []  - 0 Routine Transfer to another Facility (non-emergent condition) []  - 0 Routine Hospital Admission (non-emergent condition) []  - 0 New Admissions / / Ordering NPWT Apligraf, etc. , []  - 0 Emergency Hospital Admission (emergent condition) PROCESS - Special Needs []  - 0 Pediatric / Minor Patient Management []  - 0 Isolation Patient Management []  - 0 Hearing / Language / Visual special needs []  - 0 Assessment of Community assistance (transportation, D/C planning, etc.) []  - 0 Additional assistance / Altered mentation []  - 0 Support Surface(s) Assessment (bed, cushion, seat,  etc.) INTERVENTIONS - Miscellaneous []  - 0 External ear exam []  - 0 Patient Transfer (multiple staff / / Similar devices) []  - 0 Simple Staple / Suture removal (25 or less) []  - 0 Complex Staple / Suture removal (26 or more) []  - 0 Hypo/Hyperglycemic Management (do not check if billed separately) []  -  0 Ankle / Brachial Index (ABI) - do not check if billed separately Has the patient been seen at the hospital within the last three years: Yes Total Score: 85 Level Of Care: New/Established - Level 3 Electronic Signature(s) Signed: 06/30/2020 6:16:52 PM By: Jamie [name] Entered By: Jamie [name] on 06/30/2020 16:19:56 -------------------------------------------------------------------------------- Compression Therapy Details Patient Name: Date of Service: Jamie [name], Jamie [name]. 06/30/2020 1:15 PM Medical Record Number: [id number] Patient Account Number: [id number] Date of Birth/Sex: Treating RN: Mar 10, 1941 (80 y.o. Female) Jamie [name] Primary Care [name]: Hiram Gash RK, Lorenso Quarry Other Clinician: Referring Cornelius Schuitema: Treating Doralee Kocak/Extender: Juliet Rude in Treatment: 0 Compression Therapy Performed for Wound Assessment: Wound #1 Left,Proximal,Medial Lower Leg Performed By: Clinician Jamie Iba, RN Compression Type: Three Layer Post Procedure Diagnosis Same as Pre-procedure Electronic Signature(s) Signed: 06/30/2020 6:16:52 PM By: Jamie [name] Entered By: Jamie [name] on 06/30/2020 15:30:32 -------------------------------------------------------------------------------- Compression Therapy Details Patient Name: Date of Service: Jamie [name], Jamie [name]. 06/30/2020 1:15 PM Medical Record Number: [id number] Patient Account Number: [id number] Date of Birth/Sex: Treating RN: February 23, 1941 (80 y.o. Female) Jamie [name] Primary Care Jamie [name]: Hiram Gash RK, Lorenso Quarry Other Clinician: Referring Roselie Cirigliano: Treating  Danyal Adorno/Extender: Juliet Rude in Treatment: 0 Compression Therapy Performed for Wound Assessment: Wound #2 Left,Medial Lower Leg Performed By: Clinician Jamie Iba, RN Compression Type: Three Layer Post Procedure Diagnosis Same as Pre-procedure Electronic Signature(s) Signed: 06/30/2020 6:16:52 PM By: Jamie [name] Entered By: Jamie [name] on 06/30/2020 15:30:32 -------------------------------------------------------------------------------- Compression Therapy Details Patient Name: Date of Service: Jamie [name], Jamie [name]. 06/30/2020 1:15 PM Medical Record Number: [id number] Patient Account Number: [id number] Date of Birth/Sex: Treating RN: 1940-09-16 (80 y.o. Female) Jamie [name] Primary Care Mailani Degroote: Hiram Gash RK, Lorenso Quarry Other Clinician: Referring Sariya Trickey: Treating Konni Kesinger/Extender: Juliet Rude in Treatment: 0 Compression Therapy Performed for Wound Assessment: Wound #3 Left,Distal,Medial Lower Leg Performed By: Clinician Jamie Iba, RN Compression Type: Three Layer Post Procedure Diagnosis Same as Pre-procedure Electronic Signature(s) Signed: 06/30/2020 6:16:52 PM By: Jamie [name] Entered By: Jamie [name] on 06/30/2020 15:30:32 -------------------------------------------------------------------------------- Compression Therapy Details Patient Name: Date of Service: Jamie [name], Jamie [name]. 06/30/2020 1:15 PM Medical Record Number: [id number] Patient Account Number: [id number] Date of Birth/Sex: Treating RN: 26-Jan-1941 (80 y.o. Female) Jamie [name] Primary Care Jasmyne Lodato: Hiram Gash RK, Lorenso Quarry Other Clinician: Referring Kevina Piloto: Treating Rushi Chasen/Extender: Juliet Rude in Treatment: 0 Compression Therapy Performed for Wound Assessment: Wound #4 Left,Posterior Lower Leg Performed By: Clinician Jamie Iba, RN Compression Type: Three Layer Post Procedure  Diagnosis Same as Pre-procedure Electronic Signature(s) Signed: 06/30/2020 6:16:52 PM By: Jamie [name] Entered By: Jamie [name] on 06/30/2020 15:30:32 -------------------------------------------------------------------------------- Encounter Discharge Information Details Patient Name: Date of Service: Jamie [name], Jamie Ditto W. 06/30/2020 1:15 PM Medical Record Number: [id number] Patient Account Number: [id number] Date of Birth/Sex: Treating RN: Mar 05, 1941 (80 y.o. Female) Shawn Stall Primary Care Shaundrea Carrigg: Hiram Gash RK, Lorenso Quarry Other Clinician: Referring Barrie Wale: Treating Sybil Shrader/Extender: Juliet Rude in Treatment: 0 Encounter Discharge Information Items Post Procedure Vitals Discharge Condition: Stable Temperature (F): 98.3 Ambulatory Status: Wheelchair Pulse (bpm): 77 Discharge Destination: Home Respiratory Rate (breaths/min): 18 Transportation: Private Auto Blood Pressure (mmHg): 153/88 Accompanied By: niece Schedule Follow-up Appointment: Yes Clinical Summary of Care: Electronic Signature(s) Signed: 06/30/2020 6:38:23 PM By: Shawn Stall Entered By: Shawn Stall on 06/30/2020 18:10:41 -------------------------------------------------------------------------------- Lower Extremity Assessment Details Patient Name: Date of Service: Jamie [name], [place] W. 06/30/2020 1:15  PM Medical Record Number: [id number] Patient Account Number: [id number] Date of Birth/Sex: Treating RN: 02/12/1941 (80 y.o. Female) Zandra Abts Primary Care Rasha Ibe: Hiram Gash RK, Lorenso Quarry Other Clinician: Referring Jamye Balicki: Treating Stacye Noori/Extender: Juliet Rude in Treatment: 0 Edema Assessment Assessed: [Left: No] [Right: No] Edema: [Left: Ye] [Right: s] Calf Left: Right: Point of Measurement: 29 cm From Medial Instep 37 cm Ankle Left: Right: Point of Measurement: 11 cm From Medial Instep 25.4 cm Vascular  Assessment Pulses: Dorsalis Pedis Palpable: [Left:Yes] Electronic Signature(s) Signed: 07/03/2020 5:28:50 PM By: Zandra Abts RN, BSN Entered By: Zandra Abts on 06/30/2020 14:40:20 -------------------------------------------------------------------------------- Multi Wound Chart Details Patient Name: Date of Service: Jamie [name], Jamie Ditto W. 06/30/2020 1:15 PM Medical Record Number: [id number] Patient Account Number: [id number] Date of Birth/Sex: Treating RN: 1940/12/18 (80 y.o. Female) Jamie [name] Primary Care Shamekia Tippets: Hiram Gash RK, Lorenso Quarry Other Clinician: Referring Marlyn Rabine: Treating Robi Dewolfe/Extender: Juliet Rude in Treatment: 0 Vital Signs Height(in): 63 Pulse(bpm): 77 Weight(lbs): 150 Blood Pressure(mmHg): 153/88 Body Mass Index(BMI): 27 Temperature(F): 98.3 Respiratory Rate(breaths/min): 18 Photos: [1:No Photos Left, Proximal, Medial Lower Leg] [2:No Photos Left, Medial Lower Leg] [3:No Photos Left, Distal, Medial Lower Leg] Wound Location: [1:Gradually Appeared] [2:Gradually Appeared] [3:Gradually Appeared] Wounding Event: [1:Venous Leg Ulcer] [2:Venous Leg Ulcer] [3:Venous Leg Ulcer] Primary Etiology: [1:Sleep Apnea, Hypertension, Peripheral] [2:Sleep Apnea, Hypertension, Peripheral] [3:Sleep Apnea, Hypertension, Peripheral] Comorbid History: [1:Venous Disease, Osteoarthritis, Neuropathy 04/09/2019] [2:Venous Disease, Osteoarthritis, Neuropathy 04/09/2019] [3:Venous Disease, Osteoarthritis, Neuropathy 04/09/2019] Date Acquired: [1:0] [2:0] [3:0] Weeks of Treatment: [1:Open] [2:Open] [3:Open] Wound Status: [1:0.8x0.8x0.1] [2:1.8x1.6x0.2] [3:4.1x2.2x0.2] Measurements L x W x D (cm) [1:0.503] [2:2.262] [3:7.084] A (cm) : rea [1:0.05] [2:0.452] [3:1.417] Volume (cm) : [1:Unclassifiable] [2:Full Thickness Without Exposed] [3:Full Thickness Without Exposed] Classification: [1:Medium] [2:Support Structures Medium] [3:Support Structures  Medium] Exudate A mount: [1:Purulent] [2:Serosanguineous] [3:Purulent] Exudate Type: [1:yellow, brown, green] [2:red, brown] [3:yellow, brown, green] Exudate Color: [1:Flat and Intact] [2:Flat and Intact] [3:Flat and Intact] Wound Margin: [1:None Present (0%)] [2:Large (67-100%)] [3:Large (67-100%)] Granulation A mount: [1:N/A] [2:Red] [3:Red] Granulation Quality: [1:Large (67-100%)] [2:Small (1-33%)] [3:Small (1-33%)] Necrotic A mount: [1:Fascia: No] [2:Fat Layer (Subcutaneous Tissue): Yes Fat Layer (Subcutaneous Tissue): Yes] Exposed Structures: [1:Fat Layer (Subcutaneous Tissue): No Fascia: No Tendon: No Muscle: No Joint: No Bone: No None] [2:Tendon: No Muscle: No Joint: No Bone: No None] [3:Fascia: No Tendon: No Muscle: No Joint: No Bone: No None] Epithelialization: [1:Debridement - Excisional] [2:Debridement - Excisional] [3:Debridement - Excisional] Debridement: Pre-procedure Verification/Time Out 15:25 [2:15:25] [3:15:25] Taken: [1:Other] [2:Other] [3:Other] Pain Control: [1:Subcutaneous, Slough] [2:Subcutaneous, Slough] [3:Subcutaneous, Slough] Tissue Debrided: [1:Skin/Subcutaneous Tissue] [2:Skin/Subcutaneous Tissue] [3:Skin/Subcutaneous Tissue] Level: [1:0.64] [2:2.88] [3:9.02] Debridement A (sq cm): [1:rea Curette] [2:Curette] [3:Curette] Instrument: [1:Minimum] [2:Minimum] [3:Minimum] Bleeding: [1:Pressure] [2:Pressure] [3:Pressure] Hemostasis A chieved: [1:Procedure was tolerated well] [2:Procedure was tolerated well] [3:Procedure was tolerated well] Debridement Treatment Response: [1:1.8x1.8x0.1] [2:1.8x1.6x0.2] [3:4.1x2.2x0.2] Post Debridement Measurements L x W x D (cm) [1:0.254] [2:0.452] [3:1.417] Post Debridement Volume: (cm) [1:Compression Therapy] [2:Compression Therapy] [3:Compression Therapy] Procedures Performed: [1:Debridement] [2:Debridement 4 N/A] [3:Debridement N/A] Photos: [1:No Photos Left, Posterior Lower Leg] [2:N/A N/A] [3:N/A N/A] Wound Location:  [1:Gradually Appeared] [2:N/A] [3:N/A] Wounding Event: [1:Venous Leg Ulcer] [2:N/A] [3:N/A] Primary Etiology: [1:Sleep Apnea, Hypertension, Peripheral N/A] [3:N/A] Comorbid History: [1:Venous Disease, Osteoarthritis, Neuropathy 04/09/2019] [2:N/A] [3:N/A] Date Acquired: [1:0] [2:N/A] [3:N/A] Weeks of Treatment: [1:Open] [2:N/A] [3:N/A] Wound Status: [1:1x1x0.2] [2:N/A] [3:N/A] Measurements L x W x D (cm) [1:0.785] [2:N/A] [3:N/A] A (cm) : rea [1:0.157] [2:N/A] [3:N/A] Volume (cm) : [  1:Full Thickness Without Exposed] [2:N/A] [3:N/A] Classification: [1:Support Structures Medium] [2:N/A] [3:N/A] Exudate A mount: [1:Purulent] [2:N/A] [3:N/A] Exudate Type: [1:yellow, brown, green] [2:N/A] [3:N/A] Exudate Color: [1:Flat and Intact] [2:N/A] [3:N/A] Wound Margin: [1:Medium (34-66%)] [2:N/A] [3:N/A] Granulation A mount: [1:Pink] [2:N/A] [3:N/A] Granulation Quality: [1:Medium (34-66%)] [2:N/A] [3:N/A] Necrotic A mount: [1:Fat Layer (Subcutaneous Tissue): Yes N/A] [3:N/A] Exposed Structures: [1:Fascia: No Tendon: No Muscle: No Joint: No Bone: No None] [2:N/A] [3:N/A] Epithelialization: [1:Debridement - Excisional] [2:N/A] [3:N/A] Debridement: Pre-procedure Verification/Time Out 15:25 [2:N/A] [3:N/A] Taken: [1:Other] [2:N/A] [3:N/A] Pain Control: [1:Subcutaneous, Slough] [2:N/A] [3:N/A] Tissue Debrided: [1:Skin/Subcutaneous Tissue] [2:N/A] [3:N/A] Level: [1:1] [2:N/A] [3:N/A] Debridement A (sq cm): [1:rea Curette] [2:N/A] [3:N/A] Instrument: [1:Minimum] [2:N/A] [3:N/A] Bleeding: [1:Pressure] [2:N/A] [3:N/A] Hemostasis A chieved: [1:Procedure was tolerated well] [2:N/A] [3:N/A] Debridement Treatment Response: [1:1x1.8x0.2] [2:N/A] [3:N/A] Post Debridement Measurements L x W x D (cm) [1:0.283] [2:N/A] [3:N/A] Post Debridement Volume: (cm) [1:Compression Therapy] [2:N/A] [3:N/A] Procedures Performed: [1:Debridement] Electronic Signature(s) Signed: 06/30/2020 6:16:52 PM By: Jamie IbaBarnhart,  Jodi Signed: 07/03/2020 9:50:14 AM By: Baltazar Najjarobson, Michael MD Entered By: Baltazar Najjarobson, Michael on 06/30/2020 16:44:38 -------------------------------------------------------------------------------- Multi-Disciplinary Care Plan Details Patient Name: Date of Service: Jamie BrinkWILSO N, [place]. 06/30/2020 1:15 PM Medical Record Number: [id number] Patient Account Number: [id number] Date of Birth/Sex: Treating RN: 08/06/1940 (80 y.o. 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 Compression as ordered Notes: Wound/Skin Impairment Nursing Diagnoses: Impaired tissue integrity Goals: Patient/caregiver will verbalize understanding of skin care regimen Date Initiated: 06/30/2020 Target Resolution Date: 07/21/2020 Goal Status: Active Ulcer/skin breakdown will have a volume reduction of 30% by week 4 Date Initiated: 06/30/2020 Target Resolution Date: 07/21/2020 Goal Status: Active Interventions: Assess patient/caregiver ability to obtain necessary supplies Assess patient/caregiver ability to perform ulcer/skin care regimen upon admission and as needed Assess ulceration(s) every visit Provide education on ulcer and skin care Treatment Activities: Skin care regimen initiated : 06/30/2020 Topical wound management initiated : 06/30/2020 Notes: Electronic Signature(s) Signed: 06/30/2020 6:16:52 PM By: Jamie IbaBarnhart, Jodi Entered By: Jamie IbaBarnhart, Jodi on 06/30/2020 15:26:06 -------------------------------------------------------------------------------- Pain  Assessment Details Patient Name: Date of [place]: [name] 06/30/2020 1:15 PM Medical Record Number: [id number] Patient Account Number: [id number] Date of Birth/Sex: Treating RN: 01/06/1941 (80 y.o. Female) Zandra AbtsLynch, Shatara Primary Care Nicola Quesnell: Hiram GashEGGLESTO N-CLA RK, Lorenso QuarryV A LENICA Other Clinician: Referring Sher Hellinger: Treating Yanelly Cantrelle/Extender: Juliet Rudeobson, Michael Cathey, Reginald Weeks in Treatment: 0 Active Problems Location of Pain Severity and Description of Pain Patient Has Paino Yes Site Locations With Dressing Change: Yes Duration of the Pain. Constant / Intermittento Constant Rate the pain. Current Pain Level: 8 Worst Pain Level: 10 Least Pain Level: 4 Character of Pain Describe the Pain: Burning, Tender Pain Management and Medication Current Pain Management: Medication: Yes Cold Application: No Rest: No Massage: No Activity: No T.E.N.S.: No Heat Application: No Leg drop or elevation: No Is the Current Pain Management Adequate: Inadequate How does your wound impact your activities of daily livingo Sleep: Yes Bathing: Yes Appetite: Yes Relationship With Others: Yes Bladder Continence: Yes Emotions: Yes Bowel Continence: Yes Work: Yes Toileting: Yes Drive: Yes Dressing: Yes Hobbies: Manufacturing systems engineerYes Electronic Signature(s) Signed: 07/03/2020 5:28:50 PM By: Zandra AbtsLynch, Shatara RN, BSN Entered By: Zandra AbtsLynch, Shatara  on 06/30/2020 14:45:18 -------------------------------------------------------------------------------- Patient/Caregiver Education Details Patient Name: Date of Service: Jamie [name] [place] 3/25/2022andnbsp1:15 PM Medical Record Number: [id number] Patient Account Number: [id number] Date of Birth/Gender: Treating RN: 30-Jul-1940 (80 y.o. Female) Jamie [name] Primary Care Physician: Hiram Gash RK, Lorenso Quarry Other Clinician: Referring Physician: Treating Physician/Extender: Juliet Rude in Treatment: 0 Education  Assessment Education Provided To: Patient Education Topics Provided Nutrition: Methods: Explain/Verbal, Printed Responses: State content correctly Venous: Methods: Explain/Verbal, Printed Responses: State content correctly Wound/Skin Impairment: Methods: Explain/Verbal, Printed Responses: State content correctly Electronic Signature(s) Signed: 06/30/2020 6:16:52 PM By: Jamie [name] Entered By: Jamie [name] on 06/30/2020 15:26:41 -------------------------------------------------------------------------------- Wound Assessment Details Patient Name: Date of Service: [place]. 06/30/2020 1:15 PM Medical Record Number: [id number] Patient Account Number: [id number] Date of Birth/Sex: Treating RN: May 02, 1940 (80 y.o. Female) Zandra Abts Primary Care Elona Yinger: Hiram Gash RK, Lorenso Quarry Other Clinician: Referring Keilin Gamboa: Treating Chabeli Barsamian/Extender: Juliet Rude in Treatment: 0 Wound Status Wound Number: 1 Primary Venous Leg Ulcer Etiology: Wound Location: Left, Proximal, Medial Lower Leg Wound Open Wounding Event: Gradually Appeared Status: Date Acquired: 04/09/2019 Comorbid Sleep Apnea, Hypertension, Peripheral Venous Disease, Weeks Of Treatment: 0 History: Osteoarthritis, Neuropathy Clustered Wound: No Photos Wound Measurements Length: (cm) 0.8 Width: (cm) 0.8 Depth: (cm) 0.1 Area: (cm) 0.503 Volume: (cm) 0.05 Wound Description Classification: Unclassifiable Wound Margin: Flat and Intact Exudate Amount: Medium Exudate Type: Purulent Exudate Color: yellow, brown, green Foul Odor After Cleansing: Slough/Fibrino % Reduction in Area: 0% % Reduction in Volume: 0% Epithelialization: None Tunneling: No Undermining: No No Yes Wound Bed Granulation Amount: None Present (0%) Exposed Structure Necrotic Amount: Large (67-100%) Fascia Exposed: No Necrotic Quality: Adherent Slough Fat Layer (Subcutaneous Tissue) Exposed:  No Tendon Exposed: No Muscle Exposed: No Joint Exposed: No Bone Exposed: No Treatment Notes Wound #1 (Lower Leg) Wound Laterality: Left, Medial, Proximal Cleanser Soap and Water Discharge Instruction: May shower and wash wound with dial antibacterial soap and water prior to dressing change. Wound Cleanser Discharge Instruction: Cleanse the wound with wound cleanser prior to applying a clean dressing using gauze sponges, not tissue or cotton balls. Peri-Wound Care Triamcinolone 15 (g) Discharge Instruction: Apply to red, irritated periwound Topical Skintegrity Hydrogel 4 (oz) Discharge Instruction: Apply hydrogel to wound Primary Dressing Cutimed Sorbact Swab Discharge Instruction: Apply to wound with Hydrogel Secondary Dressing Woven Gauze Sponge, Non-Sterile 4x4 in Discharge Instruction: Apply over primary dressing as directed. ABD Pad, 8x10 Discharge Instruction: Apply over primary dressing as directed. Secured With Compression Wrap ThreePress (3 layer compression wrap) Discharge Instruction: Apply three layer compression as directed. Compression Stockings Add-Ons Electronic Signature(s) Signed: 07/03/2020 5:28:50 PM By: Zandra Abts RN, BSN Signed: 07/05/2020 9:05:34 AM By: Karl Ito Entered By: Karl Ito on 06/30/2020 17:30:40 -------------------------------------------------------------------------------- Wound Assessment Details Patient Name: Date of Service: Jamie [name], Jamie [name]. 06/30/2020 1:15 PM Medical Record Number: [id number] Patient Account Number: [id number] Date of Birth/Sex: Treating RN: Apr 03, 1941 (80 y.o. Female) Zandra Abts Primary Care Ilsa Bonello: Hiram Gash RK, Lorenso Quarry Other Clinician: Referring Freyja Govea: Treating Lesieli Bresee/Extender: Juliet Rude in Treatment: 0 Wound Status Wound Number: 2 Primary Venous Leg Ulcer Etiology: Wound Location: Left, Medial Lower Leg Wound Open Wounding Event:  Gradually Appeared Status: Date Acquired: 04/09/2019 Comorbid Sleep Apnea, Hypertension, Peripheral Venous Disease, Weeks Of Treatment: 0 History: Osteoarthritis, Neuropathy Clustered Wound: No Photos Wound Measurements Length: (cm) 1.8 Width: (cm) 1.6 Depth: (cm) 0.2 Area: (cm) 2.262 Volume: (cm) 0.452 %  Reduction in Area: 0% % Reduction in Volume: 0% Epithelialization: None Tunneling: No Undermining: No Wound Description Classification: Full Thickness Without Exposed Support Structures Wound Margin: Flat and Intact Exudate Amount: Medium Exudate Type: Serosanguineous Exudate Color: red, brown Foul Odor After Cleansing: No Slough/Fibrino Yes Wound Bed Granulation Amount: Large (67-100%) Exposed Structure Granulation Quality: Red Fascia Exposed: No Necrotic Amount: Small (1-33%) Fat Layer (Subcutaneous Tissue) Exposed: Yes Necrotic Quality: Adherent Slough Tendon Exposed: No Muscle Exposed: No Joint Exposed: No Bone Exposed: No Treatment Notes Wound #2 (Lower Leg) Wound Laterality: Left, Medial Cleanser Soap and Water Discharge Instruction: May shower and wash wound with dial antibacterial soap and water prior to dressing change. Wound Cleanser Discharge Instruction: Cleanse the wound with wound cleanser prior to applying a clean dressing using gauze sponges, not tissue or cotton balls. Peri-Wound Care Triamcinolone 15 (g) Discharge Instruction: Apply to red, irritated periwound Topical Skintegrity Hydrogel 4 (oz) Discharge Instruction: Apply hydrogel to wound Primary Dressing Cutimed Sorbact Swab Discharge Instruction: Apply to wound with Hydrogel Secondary Dressing Woven Gauze Sponge, Non-Sterile 4x4 in Discharge Instruction: Apply over primary dressing as directed. ABD Pad, 8x10 Discharge Instruction: Apply over primary dressing as directed. Secured With Compression Wrap ThreePress (3 layer compression wrap) Discharge Instruction: Apply three layer  compression as directed. Compression Stockings Add-Ons Electronic Signature(s) Signed: 07/03/2020 5:28:50 PM By: Zandra Abts RN, BSN Signed: 07/05/2020 9:05:34 AM By: Karl Ito Entered By: Karl Ito on 06/30/2020 17:31:02 -------------------------------------------------------------------------------- Wound Assessment Details Patient Name: Date of Service: Jamie [name], Jamie [name]. 06/30/2020 1:15 PM Medical Record Number: [id number] Patient Account Number: [id number] Date of Birth/Sex: Treating RN: 1940-04-27 (80 y.o. Female) Zandra Abts Primary Care Karlie Aung: Hiram Gash RK, Lorenso Quarry Other Clinician: Referring Harper Vandervoort: Treating Cherrish Vitali/Extender: Juliet Rude in Treatment: 0 Wound Status Wound Number: 3 Primary Venous Leg Ulcer Etiology: Wound Location: Left, Distal, Medial Lower Leg Wound Open Wounding Event: Gradually Appeared Status: Date Acquired: 04/09/2019 Comorbid Sleep Apnea, Hypertension, Peripheral Venous Disease, Weeks Of Treatment: 0 History: Osteoarthritis, Neuropathy Clustered Wound: No Photos Wound Measurements Length: (cm) 4.1 Width: (cm) 2.2 Depth: (cm) 0.2 Area: (cm) 7.084 Volume: (cm) 1.417 % Reduction in Area: 0% % Reduction in Volume: 0% Epithelialization: None Tunneling: No Undermining: No Wound Description Classification: Full Thickness Without Exposed Support Structures Wound Margin: Flat and Intact Exudate Amount: Medium Exudate Type: Purulent Exudate Color: yellow, brown, green Foul Odor After Cleansing: No Slough/Fibrino Yes Wound Bed Granulation Amount: Large (67-100%) Exposed Structure Granulation Quality: Red Fascia Exposed: No Necrotic Amount: Small (1-33%) Fat Layer (Subcutaneous Tissue) Exposed: Yes Necrotic Quality: Adherent Slough Tendon Exposed: No Muscle Exposed: No Joint Exposed: No Bone Exposed: No Treatment Notes Wound #3 (Lower Leg) Wound Laterality: Left, Medial,  Distal Cleanser Soap and Water Discharge Instruction: May shower and wash wound with dial antibacterial soap and water prior to dressing change. Wound Cleanser Discharge Instruction: Cleanse the wound with wound cleanser prior to applying a clean dressing using gauze sponges, not tissue or cotton balls. Peri-Wound Care Triamcinolone 15 (g) Discharge Instruction: Apply to red, irritated periwound Topical Skintegrity Hydrogel 4 (oz) Discharge Instruction: Apply hydrogel to wound Primary Dressing Cutimed Sorbact Swab Discharge Instruction: Apply to wound with Hydrogel Secondary Dressing Woven Gauze Sponge, Non-Sterile 4x4 in Discharge Instruction: Apply over primary dressing as directed. ABD Pad, 8x10 Discharge Instruction: Apply over primary dressing as directed. Secured With Compression Wrap ThreePress (3 layer compression wrap) Discharge Instruction: Apply three layer compression as directed. Compression Stockings Facilities manager) Signed:  07/03/2020 5:28:50 PM By: Zandra Abts RN, BSN Signed: 07/05/2020 9:05:34 AM By: Karl Ito Entered By: Karl Ito on 06/30/2020 17:31:22 -------------------------------------------------------------------------------- Wound Assessment Details Patient Name: Date of Service: Jamie [name], Jamie [name]. 06/30/2020 1:15 PM Medical Record Number: [id number] Patient Account Number: [id number] Date of Birth/Sex: Treating RN: Feb 12, 1941 (80 y.o. Female) Zandra Abts Primary Care Timarie Labell: Hiram Gash RK, Lorenso Quarry Other Clinician: Referring Britaney Espaillat: Treating Collette Pescador/Extender: Juliet Rude in Treatment: 0 Wound Status Wound Number: 4 Primary Venous Leg Ulcer Etiology: Wound Location: Left, Posterior Lower Leg Wound Open Wounding Event: Gradually Appeared Status: Date Acquired: 04/09/2019 Comorbid Sleep Apnea, Hypertension, Peripheral Venous Disease, Weeks Of Treatment: 0 History:  Osteoarthritis, Neuropathy Clustered Wound: No Photos Wound Measurements Length: (cm) 1 Width: (cm) 1 Depth: (cm) 0.2 Area: (cm) 0.785 Volume: (cm) 0.157 % Reduction in Area: 0% % Reduction in Volume: 0% Epithelialization: None Tunneling: No Undermining: No Wound Description Classification: Full Thickness Without Exposed Support Structures Wound Margin: Flat and Intact Exudate Amount: Medium Exudate Type: Purulent Exudate Color: yellow, brown, green Foul Odor After Cleansing: No Slough/Fibrino Yes Wound Bed Granulation Amount: Medium (34-66%) Exposed Structure Granulation Quality: Pink Fascia Exposed: No Necrotic Amount: Medium (34-66%) Fat Layer (Subcutaneous Tissue) Exposed: Yes Necrotic Quality: Adherent Slough Tendon Exposed: No Muscle Exposed: No Joint Exposed: No Bone Exposed: No Treatment Notes Wound #4 (Lower Leg) Wound Laterality: Left, Posterior Cleanser Soap and Water Discharge Instruction: May shower and wash wound with dial antibacterial soap and water prior to dressing change. Wound Cleanser Discharge Instruction: Cleanse the wound with wound cleanser prior to applying a clean dressing using gauze sponges, not tissue or cotton balls. Peri-Wound Care Triamcinolone 15 (g) Discharge Instruction: Apply to red, irritated periwound Topical Skintegrity Hydrogel 4 (oz) Discharge Instruction: Apply hydrogel to wound Primary Dressing Cutimed Sorbact Swab Discharge Instruction: Apply to wound with Hydrogel Secondary Dressing Woven Gauze Sponge, Non-Sterile 4x4 in Discharge Instruction: Apply over primary dressing as directed. ABD Pad, 8x10 Discharge Instruction: Apply over primary dressing as directed. Secured With Compression Wrap ThreePress (3 layer compression wrap) Discharge Instruction: Apply three layer compression as directed. Compression Stockings Add-Ons Electronic Signature(s) Signed: 07/03/2020 5:28:50 PM By: Zandra Abts RN, BSN Signed:  07/05/2020 9:05:34 AM By: Karl Ito Entered By: Karl Ito on 06/30/2020 17:31:49 -------------------------------------------------------------------------------- Vitals Details Patient Name: Date of Service: Jamie Brink, [name] 06/30/2020 1:15 PM Medical Record Number: [id number] Patient Account Number: [id number] Date of Birth/Sex: Treating RN: 1940/12/16 (80 y.o. Female) Zandra Abts Primary Care Cina Klumpp: Hiram Gash RK, Lorenso Quarry Other Clinician: Referring Dioselina Brumbaugh: Treating Dejanae Helser/Extender: Juliet Rude in Treatment: 0 Vital Signs Time Taken: 14:20 Temperature (F): 98.3 Height (in): 63 Pulse (bpm): 77 Source: Stated Respiratory Rate (breaths/min): 18 Weight (lbs): 150 Blood Pressure (mmHg): 153/88 Source: Stated Reference Range: 80 - 120 mg / dl Body Mass Index (BMI): 26.6 Electronic Signature(s) Signed: 07/03/2020 5:28:50 PM By: Zandra Abts RN, BSN Entered By: Zandra Abts on 06/30/2020 14:39:29

## 2020-07-06 ENCOUNTER — Encounter (HOSPITAL_BASED_OUTPATIENT_CLINIC_OR_DEPARTMENT_OTHER): Payer: Medicare Other | Admitting: Internal Medicine

## 2020-07-06 ENCOUNTER — Other Ambulatory Visit: Payer: Self-pay

## 2020-07-06 DIAGNOSIS — L97922 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: Secondary | ICD-10-CM | POA: Diagnosis not present

## 2020-07-06 NOTE — Progress Notes (Signed)
Jamie [name], Jamie [name] ([id number]) Visit Report for 07/06/2020 Arrival Information Details Patient Name: Date of Service: Jamie [name], [name] 07/06/2020 1:30 PM Medical Record Number: [id number] Patient Account Number: [id number] Date of Birth/Sex: Treating RN: May 20, 1940 (80 y.o. [name] Primary Care Kendall Justo: Hiram Gash RK, Lorenso Quarry Other Clinician: Referring Azucena Dart: Treating Devell Parkerson/Extender: Diamantina Monks [name]-CLA RK, V A LENICA Weeks in Treatment: 0 Visit Information History Since Last Visit Added or deleted any medications: No Patient Arrived: Wheel Chair Any new allergies or adverse reactions: No Arrival Time: 12:48 Had a fall or experienced change in No Accompanied By: niece activities of daily living that may affect Transfer Assistance: None risk of falls: Patient Identification Verified: Yes Signs or symptoms of abuse/neglect since last visito No Secondary Verification Process Completed: Yes Hospitalized since last visit: No Patient Requires Transmission-Based Precautions: No Implantable device outside of the clinic excluding No Patient Has Alerts: Yes cellular tissue based products placed in the center Patient Alerts: L ABI: 1.06; R ABI: 1.01 since last visit: Has Dressing in Place as Prescribed: Yes Has Compression in Place as Prescribed: Yes Pain Present Now: Yes Electronic Signature(s) Signed: 07/06/2020 5:46:28 PM By: Zandra Abts RN, BSN Entered By: Zandra Abts on 07/06/2020 12:50:21 -------------------------------------------------------------------------------- Compression Therapy Details Patient Name: Date of Service: Jamie [name], [name] 07/06/2020 1:30 PM Medical Record Number: [id number] Patient Account Number: [id number] Date of Birth/Sex: Treating RN: 12/11/1940 (80 y.o. [name] Primary Care Hoke Baer: Hiram Gash RK, Lorenso Quarry Other Clinician: Referring Ruhani Umland: Treating Jeromy Borcherding/Extender: Diamantina Monks [name]-CLA RK, V A LENICA Weeks in Treatment: 0 Compression Therapy Performed for Wound Assessment: Wound #1 Left,Proximal,Medial Lower Leg Performed By: Clinician Zenaida Deed, RN Compression Type: Three Layer Post Procedure Diagnosis Same as Pre-procedure Electronic Signature(s) Signed: 07/06/2020 5:47:39 PM By: Shawn Stall Entered By: Shawn Stall on 07/06/2020 13:24:26 -------------------------------------------------------------------------------- Compression Therapy Details Patient Name: Date of Service: Jamie [name], [name] 07/06/2020 1:30 PM Medical Record Number: [id number] Patient Account Number: [id number] Date of Birth/Sex: Treating RN: Nov 02, 1940 (80 y.o. [name] Primary Care Yamna Mackel: Hiram Gash RK, Lorenso Quarry Other Clinician: Referring Moksha Dorgan: Treating Curran Lenderman/Extender: Diamantina Monks [name]-CLA RK, V A LENICA Weeks in Treatment: 0 Compression Therapy Performed for Wound Assessment: Wound #3 Left,Distal,Medial Lower Leg Performed By: Clinician Zenaida Deed, RN Compression Type: Three Layer Post Procedure Diagnosis Same as Pre-procedure Electronic Signature(s) Signed: 07/06/2020 5:47:39 PM By: Shawn Stall Entered By: Shawn Stall on 07/06/2020 13:24:26 -------------------------------------------------------------------------------- Compression Therapy Details Patient Name: Date of Service: Jamie [name], [name] 07/06/2020 1:30 PM Medical Record Number: [id number] Patient Account Number: [id number] Date of Birth/Sex: Treating RN: 26-Jul-1940 (80 y.o. [name] Primary Care Jenetta Wease: Hiram Gash RK, Lorenso Quarry Other Clinician: Referring Tuck Dulworth: Treating Kiren Mcisaac/Extender: Diamantina Monks [name]-CLA RK, V A LENICA Weeks in Treatment: 0 Compression Therapy Performed for Wound Assessment: Wound #4 Left,Posterior Lower Leg Performed By: Clinician Zenaida Deed, RN Compression Type: Three Layer Post  Procedure Diagnosis Same as Pre-procedure Electronic Signature(s) Signed: 07/06/2020 5:47:39 PM By: Shawn Stall Entered By: Shawn Stall on 07/06/2020 13:24:26 -------------------------------------------------------------------------------- Compression Therapy Details Patient Name: Date of Service: Jamie [name], [name] 07/06/2020 1:30 PM Medical Record Number: [id number] Patient Account Number: [id number] Date of Birth/Sex: Treating RN: [birth year] (80 y.o. [name] Primary Care Dimetri Armitage: Hiram Gash RK, Lorenso Quarry Other Clinician: Referring Sharunda Salmon: Treating Zanayah Shadowens/Extender: Diamantina Monks [name]-CLA RK, V A LENICA Weeks in Treatment: 0 Compression Therapy  Performed for Wound Assessment: Wound #2 Left,Medial Lower Leg Performed By: Clinician Zenaida Deed, RN Compression Type: Three Layer Post Procedure Diagnosis Same as Pre-procedure Electronic Signature(s) Signed: 07/06/2020 5:47:39 PM By: Shawn Stall Entered By: Shawn Stall on 07/06/2020 13:24:27 -------------------------------------------------------------------------------- Encounter Discharge Information Details Patient Name: Date of Service: Jamie [name], [name] 07/06/2020 1:30 PM Medical Record Number: [id number] Patient Account Number: [id number] Date of Birth/Sex: Treating RN: 1940-11-15 (80 y.o. [name] Primary Care Brandii Lakey: Harlow Mares, Lorenso Quarry Other Clinician: Referring Kortlyn Koltz: Treating Emannuel Vise/Extender: Diamantina Monks [name]-CLA RK, V A LENICA Weeks in Treatment: 0 Encounter Discharge Information Items Discharge Condition: Stable Ambulatory Status: Wheelchair Discharge Destination: Home Transportation: Private Auto Accompanied By: daughter Schedule Follow-up Appointment: Yes Clinical Summary of Care: Patient Declined Electronic Signature(s) Signed: 07/06/2020 5:43:47 PM By: Zenaida Deed RN, BSN Entered By: Zenaida Deed on 07/06/2020  13:52:26 -------------------------------------------------------------------------------- Lower Extremity Assessment Details Patient Name: Date of Service: Jamie [name], [name] 07/06/2020 1:30 PM Medical Record Number: [id number] Patient Account Number: [id number] Date of Birth/Sex: Treating RN: 1941/02/09 (80 y.o. [name] Primary Care Andra Heslin: Hiram Gash RK, Lorenso Quarry Other Clinician: Referring Cheyla Duchemin: Treating Oree Hislop/Extender: Diamantina Monks [name]-CLA RK, V A LENICA Weeks in Treatment: 0 Edema Assessment Assessed: [Left: No] [Right: No] Edema: [Left: Ye] [Right: s] Calf Left: Right: Point of Measurement: 29 cm From Medial Instep 34.5 cm Ankle Left: Right: Point of Measurement: 11 cm From Medial Instep 25 cm Vascular Assessment Pulses: Dorsalis Pedis Palpable: [Left:Yes] Electronic Signature(s) Signed: 07/06/2020 5:46:28 PM By: Zandra Abts RN, BSN Entered By: Zandra Abts on 07/06/2020 13:09:47 -------------------------------------------------------------------------------- Multi Wound Chart Details Patient Name: Date of Service: Jamie [name], [name] 07/06/2020 1:30 PM Medical Record Number: [id number] Patient Account Number: [id number] Date of Birth/Sex: Treating RN: November 02, 1940 (80 y.o. Debara Pickett, [place] Primary Care Ohm Dentler: Hiram Gash RK, Lorenso Quarry Other Clinician: Referring Deya Bigos: Treating Axxel Gude/Extender: Diamantina Monks [name]-CLA RK, V A LENICA Weeks in Treatment: 0 Vital Signs Height(in): 63 Pulse(bpm): 83 Weight(lbs): 150 Blood Pressure(mmHg): 161/91 Body Mass Index(BMI): 27 Temperature(F): 98.3 Respiratory Rate(breaths/min): 16 Photos: [1:No Photos Left, Proximal, Medial Lower Leg] [2:No Photos Left, Medial Lower Leg] [3:No Photos Left, Distal, Medial Lower Leg] Wound Location: [1:Gradually Appeared] [2:Gradually Appeared] [3:Gradually Appeared] Wounding Event: [1:Venous Leg Ulcer] [2:Venous Leg Ulcer]  [3:Venous Leg Ulcer] Primary Etiology: [1:Sleep Apnea, Hypertension, Peripheral Sleep Apnea, Hypertension, Peripheral Sleep Apnea, Hypertension, Peripheral] Comorbid History: [1:Venous Disease, Osteoarthritis, Neuropathy 04/09/2019] [2:Venous Disease, Osteoarthritis, Neuropathy 04/09/2019] [3:Venous Disease, Osteoarthritis, Neuropathy 04/09/2019] Date Acquired: [1:0] [2:0] [3:0] Weeks of Treatment: [1:Open] [2:Open] [3:Open] Wound Status: [1:1.1x1.6x0.1] [2:2x1.9x0.2] [3:4.5x2.5x0.2] Measurements L x W x D (cm) [1:1.382] [2:2.985] [3:8.836] A (cm) : rea [1:0.138] [2:0.597] [3:1.767] Volume (cm) : [1:-174.80%] [2:-32.00%] [3:-24.70%] % Reduction in Area: [1:-176.00%] [2:-32.10%] [3:-24.70%] % Reduction in Volume: [1:Full Thickness Without Exposed] [2:Full Thickness Without Exposed] [3:Full Thickness Without Exposed] Classification: [1:Support Structures Medium] [2:Support Structures Medium] [3:Support Structures Medium] Exudate Amount: [1:Purulent] [2:Serosanguineous] [3:Purulent] Exudate Type: [1:yellow, brown, green] [2:red, brown] [3:yellow, brown, green] Exudate Color: [1:Flat and Intact] [2:Flat and Intact] [3:Flat and Intact] Wound Margin: [1:Small (1-33%)] [2:Medium (34-66%)] [3:Large (67-100%)] Granulation Amount: [1:Pink] [2:Red] [3:Red] Granulation Quality: [1:Large (67-100%)] [2:Medium (34-66%)] [3:Small (1-33%)] Necrotic Amount: [1:Fat Layer (Subcutaneous Tissue): Yes Fat Layer (Subcutaneous Tissue): Yes Fat Layer (Subcutaneous Tissue): Yes] Exposed Structures: [1:Fascia: No Tendon: No Muscle: No Joint: No Bone: No None] [2:Fascia: No Tendon: No Muscle: No Joint: No Bone: No None] [3:Fascia: No Tendon: No  Muscle: No Joint: No Bone: No None] Epithelialization: [1:Compression Therapy] [2:Compression Therapy] [3:Compression Therapy] Wound Number: 4 [name] [name] Photos: No Photos [name] [name] Left, Posterior Lower Leg [name] [name] Wound Location: Gradually Appeared [name] [name] Wounding Event: Venous  Leg Ulcer [name] [name] Primary Etiology: Sleep Apnea, Hypertension, Peripheral [name] [name] Comorbid History: Venous Disease, Osteoarthritis, Neuropathy 04/09/2019 [name] [name] Date Acquired: 0 [name] [name] Weeks of Treatment: Open [name] [name] Wound Status: 1.5x0.9x0.2 [name] [name] Measurements L x W x D (cm) 1.06 [name] [name] A (cm) : rea 0.212 [name] [name] Volume (cm) : -35.00% [name] [name] % Reduction in Area: -35.00% [name] [name] % Reduction in Volume: Full Thickness Without Exposed [name] [name] Classification: Support Structures Medium [name] [name] Exudate Amount: Purulent [name] [name] Exudate Type: yellow, brown, green [name] [name] Exudate Color: Flat and Intact [name] [name] Wound Margin: Small (1-33%) [name] [name] Granulation Amount: Pink [name] [name] Granulation Quality: Large (67-100%) [name] [name] Necrotic Amount: Fat Layer (Subcutaneous Tissue): Yes [name] [name] Exposed Structures: Fascia: No Tendon: No Muscle: No Joint: No Bone: No None [name] [name] Epithelialization: Compression Therapy [name] [name] Procedures Performed: Treatment Notes Electronic Signature(s) Signed: 07/06/2020 5:43:02 PM By: [name] MD Signed: 07/06/2020 5:47:39 PM By: Shawn Stall Entered By: [name] on 07/06/2020 13:34:09 -------------------------------------------------------------------------------- Multi-Disciplinary Care Plan Details Patient Name: Date of Service: Jamie Brink, Jamie RGEURITE W. 07/06/2020 1:30 PM Medical Record Number: [id number] Patient Account Number: [id number] Date of Birth/Sex: Treating RN: 1941-02-04 (80 y.o. 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 Compression as ordered Notes: Wound/Skin Impairment Nursing Diagnoses: Impaired tissue integrity Goals: Patient/caregiver will verbalize understanding of skin care regimen Date Initiated: 06/30/2020 Target Resolution Date: 07/21/2020 Goal Status: Active Ulcer/skin breakdown will have a volume reduction of 30% by week 4 Date Initiated: 06/30/2020 Target Resolution Date: 07/21/2020 Goal Status: Active Interventions: Assess patient/caregiver ability to obtain necessary supplies Assess patient/caregiver ability to perform ulcer/skin care regimen upon admission and as needed Assess ulceration(s) every visit Provide education on ulcer and skin care Treatment Activities: Skin care regimen initiated : 06/30/2020 Topical wound management initiated : 06/30/2020 Notes: Electronic Signature(s) Signed: 07/06/2020 5:47:39 PM By: Shawn Stall Entered By: Shawn Stall on 07/06/2020 12:49:11 -------------------------------------------------------------------------------- Pain Assessment Details Patient Name: Date of Service: Jamie [name], [name] 07/06/2020 1:30 PM Medical Record Number: [id number] Patient Account Number: [id number] Date of Birth/Sex: Treating RN: 1940-10-12 (80 y.o. [name] Primary Care Malavika Lira: Hiram Gash RK, Lorenso Quarry Other Clinician: Referring Rosezella Kronick: Treating Masson Nalepa/Extender: Diamantina Monks [name]-CLA RK, V A LENICA Weeks in Treatment: 0 Active Problems Location of Pain Severity and Description of Pain Patient Has Paino Yes Site Locations Pain Location: Pain in Ulcers With Dressing Change: Yes Duration of the Pain. Constant / Intermittento Constant Rate the pain. Current Pain Level: 8 Character of Pain Describe the Pain: Burning, Throbbing Pain Management and Medication Current Pain Management: Medication: No Cold Application: No Rest:  No Massage: No Activity: No T.E.[name].S.: No Heat Application: No Leg drop or elevation: No Is the Current Pain Management Adequate: Adequate How does your wound impact your activities of daily livingo Sleep: No Bathing: No Appetite: No Relationship With  Others: No Bladder Continence: No Emotions: No Bowel Continence: No Work: No Toileting: No Drive: No Dressing: No Hobbies: No Electronic Signature(s) Signed: 07/06/2020 5:46:28 PM By: Zandra Abts RN, BSN Entered By: Zandra Abts on 07/06/2020 12:54:45 -------------------------------------------------------------------------------- Patient/Caregiver Education Details Patient Name: Date of Service: [place] 3/31/2022andnbsp1:30 PM Medical Record Number: [id number] Patient Account Number: [id number] Date of Birth/Gender: Treating RN: 04-08-1941 (80 y.o. [name] Primary Care Physician: Hiram Gash RK, Lorenso Quarry Other Clinician: Referring Physician: Treating Physician/Extender: Diamantina Monks [name]-CLA RK, V A LENICA Weeks in Treatment: 0 Education Assessment Education Provided To: Patient Education Topics Provided Wound/Skin Impairment: Handouts: Skin Care Do's and Dont's Methods: Explain/Verbal Responses: Reinforcements needed Electronic Signature(s) Signed: 07/06/2020 5:47:39 PM By: Shawn Stall Entered By: Shawn Stall on 07/06/2020 13:00:16 -------------------------------------------------------------------------------- Wound Assessment Details Patient Name: Date of Service: Jamie [name], [name] 07/06/2020 1:30 PM Medical Record Number: [id number] Patient Account Number: [id number] Date of Birth/Sex: Treating RN: 1940/09/14 (80 y.o. [name] Primary Care Lonney Revak: Hiram Gash RK, Lorenso Quarry Other Clinician: Referring Imojean Yoshino: Treating Davontae Prusinski/Extender: Diamantina Monks [name]-CLA RK, V A LENICA Weeks in Treatment: 0 Wound Status Wound Number: 1 Primary  Venous Leg Ulcer Etiology: Wound Location: Left, Proximal, Medial Lower Leg Wound Open Wounding Event: Gradually Appeared Status: Date Acquired: 04/09/2019 Comorbid Sleep Apnea, Hypertension, Peripheral Venous Disease, Weeks Of Treatment: 0 History: Osteoarthritis, Neuropathy Clustered Wound: No Photos Wound Measurements Length: (cm) 1.1 Width: (cm) 1.6 Depth: (cm) 0.1 Area: (cm) 1.382 Volume: (cm) 0.138 % Reduction in Area: -174.8% % Reduction in Volume: -176% Epithelialization: None Tunneling: No Undermining: No Wound Description Classification: Full Thickness Without Exposed Support Structures Wound Margin: Flat and Intact Exudate Amount: Medium Exudate Type: Purulent Exudate Color: yellow, brown, green Foul Odor After Cleansing: No Slough/Fibrino Yes Wound Bed Granulation Amount: Small (1-33%) Exposed Structure Granulation Quality: Pink Fascia Exposed: No Necrotic Amount: Large (67-100%) Fat Layer (Subcutaneous Tissue) Exposed: Yes Necrotic Quality: Adherent Slough Tendon Exposed: No Muscle Exposed: No Joint Exposed: No Bone Exposed: No Treatment Notes Wound #1 (Lower Leg) Wound Laterality: Left, Medial, Proximal Cleanser Soap and Water Discharge Instruction: May shower and wash wound with dial antibacterial soap and water prior to dressing change. Wound Cleanser Discharge Instruction: Cleanse the wound with wound cleanser prior to applying a clean dressing using gauze sponges, not tissue or cotton balls. Peri-Wound Care Triamcinolone 15 (g) Discharge Instruction: In clinic only. Apply to red, irritated periwound Sween Lotion (Moisturizing lotion) Discharge Instruction: apply at home. Apply moisturizing lotion as directed Topical Skintegrity Hydrogel 4 (oz) Discharge Instruction: Apply hydrogel to wound Primary Dressing Cutimed Sorbact Swab Discharge Instruction: Apply to wound with Hydrogel Secondary Dressing Woven Gauze Sponge, Non-Sterile 4x4  in Discharge Instruction: Apply over primary dressing as directed. ABD Pad, 8x10 Discharge Instruction: Apply over primary dressing as directed. Zetuvit Plus 4x8 in Discharge Instruction: Apply over primary dressing as directed. CarboFLEX Odor Control Dressing, 4x4 in Discharge Instruction: Apply over primary dressing as directed. Secured With Compression Wrap ThreePress (3 layer compression wrap) Discharge Instruction: Apply three layer compression as directed. Compression Stockings Add-Ons Electronic Signature(s) Signed: 07/06/2020 5:28:26 PM By: Karl Ito Signed: 07/06/2020 5:46:28 PM By: Zandra Abts RN, BSN Entered By: Karl Ito on 07/06/2020 16:58:11 -------------------------------------------------------------------------------- Wound Assessment Details Patient Name: Date of Service: Jamie [name], [name] 07/06/2020 1:30 PM Medical Record Number: [id number] Patient Account Number: [id number] Date of Birth/Sex: Treating RN: [birth year] (80 y.o. [name] Primary Care Emily Massar: Vida Roller [name]-CLA  RK, V A LENICA Other Clinician: Referring Terrionna Bridwell: Treating Haiven Nardone/Extender: Diamantina Monks [name]-CLA RK, V A LENICA Weeks in Treatment: 0 Wound Status Wound Number: 2 Primary Venous Leg Ulcer Etiology: Wound Location: Left, Medial Lower Leg Wound Open Wounding Event: Gradually Appeared Status: Date Acquired: 04/09/2019 Comorbid Sleep Apnea, Hypertension, Peripheral Venous Disease, Weeks Of Treatment: 0 History: Osteoarthritis, Neuropathy Clustered Wound: No Photos Wound Measurements Length: (cm) 2 Width: (cm) 1.9 Depth: (cm) 0.2 Area: (cm) 2.985 Volume: (cm) 0.597 % Reduction in Area: -32% % Reduction in Volume: -32.1% Epithelialization: None Tunneling: No Undermining: No Wound Description Classification: Full Thickness Without Exposed Support Structures Wound Margin: Flat and Intact Exudate Amount: Medium Exudate Type:  Serosanguineous Exudate Color: red, brown Foul Odor After Cleansing: No Slough/Fibrino Yes Wound Bed Granulation Amount: Medium (34-66%) Exposed Structure Granulation Quality: Red Fascia Exposed: No Necrotic Amount: Medium (34-66%) Fat Layer (Subcutaneous Tissue) Exposed: Yes Necrotic Quality: Adherent Slough Tendon Exposed: No Muscle Exposed: No Joint Exposed: No Bone Exposed: No Treatment Notes Wound #2 (Lower Leg) Wound Laterality: Left, Medial Cleanser Soap and Water Discharge Instruction: May shower and wash wound with dial antibacterial soap and water prior to dressing change. Wound Cleanser Discharge Instruction: Cleanse the wound with wound cleanser prior to applying a clean dressing using gauze sponges, not tissue or cotton balls. Peri-Wound Care Triamcinolone 15 (g) Discharge Instruction: In clinic only. Apply to red, irritated periwound Sween Lotion (Moisturizing lotion) Discharge Instruction: apply at home. Apply moisturizing lotion as directed Topical Skintegrity Hydrogel 4 (oz) Discharge Instruction: Apply hydrogel to wound Primary Dressing Cutimed Sorbact Swab Discharge Instruction: Apply to wound with Hydrogel Secondary Dressing Woven Gauze Sponge, Non-Sterile 4x4 in Discharge Instruction: Apply over primary dressing as directed. ABD Pad, 8x10 Discharge Instruction: Apply over primary dressing as directed. Zetuvit Plus 4x8 in Discharge Instruction: Apply over primary dressing as directed. CarboFLEX Odor Control Dressing, 4x4 in Discharge Instruction: Apply over primary dressing as directed. Secured With Compression Wrap ThreePress (3 layer compression wrap) Discharge Instruction: Apply three layer compression as directed. Compression Stockings Add-Ons Electronic Signature(s) Signed: 07/06/2020 5:28:26 PM By: Karl Ito Signed: 07/06/2020 5:46:28 PM By: Zandra Abts RN, BSN Entered By: Karl Ito on 07/06/2020  16:58:36 -------------------------------------------------------------------------------- Wound Assessment Details Patient Name: Date of Service: Jamie [name], [name] 07/06/2020 1:30 PM Medical Record Number: [id number] Patient Account Number: [id number] Date of Birth/Sex: Treating RN: [birth year] (80 y.o. [name] Primary Care Minal Stuller: Hiram Gash RK, Lorenso Quarry Other Clinician: Referring Chan Rosasco: Treating Blaise Grieshaber/Extender: Diamantina Monks [name]-CLA RK, V A LENICA Weeks in Treatment: 0 Wound Status Wound Number: 3 Primary Venous Leg Ulcer Etiology: Wound Location: Left, Distal, Medial Lower Leg Wound Open Wounding Event: Gradually Appeared Status: Date Acquired: 04/09/2019 Comorbid Sleep Apnea, Hypertension, Peripheral Venous Disease, Weeks Of Treatment: 0 Weeks Of Treatment: 0 History: Osteoarthritis, Neuropathy Clustered Wound: No Photos Wound Measurements Length: (cm) 4.5 Width: (cm) 2.5 Depth: (cm) 0.2 Area: (cm) 8.836 Volume: (cm) 1.767 % Reduction in Area: -24.7% % Reduction in Volume: -24.7% Epithelialization: None Tunneling: No Undermining: No Wound Description Classification: Full Thickness Without Exposed Support Structures Wound Margin: Flat and Intact Exudate Amount: Medium Exudate Type: Purulent Exudate Color: yellow, brown, green Foul Odor After Cleansing: No Slough/Fibrino Yes Wound Bed Granulation Amount: Large (67-100%) Exposed Structure Granulation Quality: Red Fascia Exposed: No Necrotic Amount: Small (1-33%) Fat Layer (Subcutaneous Tissue) Exposed: Yes Necrotic Quality: Adherent Slough Tendon Exposed: No Muscle Exposed: No Joint Exposed: No Bone Exposed: No Treatment Notes Wound #3 (Lower Leg)  Wound Laterality: Left, Medial, Distal Cleanser Soap and Water Discharge Instruction: May shower and wash wound with dial antibacterial soap and water prior to dressing change. Wound Cleanser Discharge Instruction: Cleanse  the wound with wound cleanser prior to applying a clean dressing using gauze sponges, not tissue or cotton balls. Peri-Wound Care Triamcinolone 15 (g) Discharge Instruction: In clinic only. Apply to red, irritated periwound Sween Lotion (Moisturizing lotion) Discharge Instruction: apply at home. Apply moisturizing lotion as directed Topical Skintegrity Hydrogel 4 (oz) Discharge Instruction: Apply hydrogel to wound Primary Dressing Cutimed Sorbact Swab Discharge Instruction: Apply to wound with Hydrogel Secondary Dressing Woven Gauze Sponge, Non-Sterile 4x4 in Discharge Instruction: Apply over primary dressing as directed. ABD Pad, 8x10 Discharge Instruction: Apply over primary dressing as directed. Zetuvit Plus 4x8 in Discharge Instruction: Apply over primary dressing as directed. CarboFLEX Odor Control Dressing, 4x4 in Discharge Instruction: Apply over primary dressing as directed. Secured With Compression Wrap ThreePress (3 layer compression wrap) Discharge Instruction: Apply three layer compression as directed. Compression Stockings Add-Ons Electronic Signature(s) Signed: 07/06/2020 5:28:26 PM By: Karl Ito Signed: 07/06/2020 5:46:28 PM By: Zandra Abts RN, BSN Entered By: Karl Ito on 07/06/2020 16:59:30 -------------------------------------------------------------------------------- Wound Assessment Details Patient Name: Date of Service: Jamie [name], [name] 07/06/2020 1:30 PM Medical Record Number: [id number] Patient Account Number: [id number] Date of Birth/Sex: Treating RN: 25-Dec-1940 (80 y.o. [name] Primary Care Brigido Mera: Hiram Gash RK, Lorenso Quarry Other Clinician: Referring Khyli Swaim: Treating Denney Shein/Extender: Diamantina Monks [name]-CLA RK, V A LENICA Weeks in Treatment: 0 Wound Status Wound Number: 4 Primary Venous Leg Ulcer Etiology: Wound Location: Left, Posterior Lower Leg Wound Open Wounding Event: Gradually  Appeared Status: Date Acquired: 04/09/2019 Comorbid Sleep Apnea, Hypertension, Peripheral Venous Disease, Weeks Of Treatment: 0 History: Osteoarthritis, Neuropathy Clustered Wound: No Photos Wound Measurements Length: (cm) 1.5 Width: (cm) 0.9 Depth: (cm) 0.2 Area: (cm) 1.06 Volume: (cm) 0.212 % Reduction in Area: -35% % Reduction in Volume: -35% Epithelialization: None Tunneling: No Undermining: No Wound Description Classification: Full Thickness Without Exposed Support Structures Wound Margin: Flat and Intact Exudate Amount: Medium Exudate Type: Purulent Exudate Color: yellow, brown, green Foul Odor After Cleansing: No Slough/Fibrino Yes Wound Bed Granulation Amount: Small (1-33%) Exposed Structure Granulation Quality: Pink Fascia Exposed: No Necrotic Amount: Large (67-100%) Fat Layer (Subcutaneous Tissue) Exposed: Yes Necrotic Quality: Adherent Slough Tendon Exposed: No Muscle Exposed: No Joint Exposed: No Bone Exposed: No Treatment Notes Wound #4 (Lower Leg) Wound Laterality: Left, Posterior Cleanser Soap and Water Discharge Instruction: May shower and wash wound with dial antibacterial soap and water prior to dressing change. Wound Cleanser Discharge Instruction: Cleanse the wound with wound cleanser prior to applying a clean dressing using gauze sponges, not tissue or cotton balls. Peri-Wound Care Triamcinolone 15 (g) Discharge Instruction: In clinic only. Apply to red, irritated periwound Sween Lotion (Moisturizing lotion) Discharge Instruction: apply at home. Apply moisturizing lotion as directed Topical Skintegrity Hydrogel 4 (oz) Discharge Instruction: Apply hydrogel to wound Primary Dressing Cutimed Sorbact Swab Discharge Instruction: Apply to wound with Hydrogel Secondary Dressing Woven Gauze Sponge, Non-Sterile 4x4 in Discharge Instruction: Apply over primary dressing as directed. ABD Pad, 8x10 Discharge Instruction: Apply over primary dressing  as directed. Zetuvit Plus 4x8 in Discharge Instruction: Apply over primary dressing as directed. CarboFLEX Odor Control Dressing, 4x4 in Discharge Instruction: Apply over primary dressing as directed. Secured With Compression Wrap ThreePress (3 layer compression wrap) Discharge Instruction: Apply three layer compression as directed. Compression Stockings Add-Ons Electronic Signature(s) Signed: 07/06/2020 5:28:26  PM By: Karl Itoawkins, Destiny Signed: 07/06/2020 5:46:28 PM By: Zandra AbtsLynch, Shatara RN, BSN Entered By: Karl Itoawkins, Destiny on 07/06/2020 16:57:37 -------------------------------------------------------------------------------- Vitals Details Patient Name: Date of Service: Jamie BrinkWILSO [name], Jamie RGEURITE W. 07/06/2020 1:30 PM Medical Record Number: [id number] Patient Account Number: [id number] Date of Birth/Sex: Treating RN: 03/01/1941 (80 y.o. Wynelle LinkF) Lynch, Shatara Primary Care Delmer Kowalski: Hiram GashEGGLESTO [name]-CLA RK, Lorenso QuarryV A LENICA Other Clinician: Referring Sebastion Jun: Treating Aaryana Betke/Extender: Diamantina Monksobson, Michael EGGLESTO [name]-CLA RK, V A LENICA Weeks in Treatment: 0 Vital Signs Time Taken: 12:53 Temperature (F): 98.3 Height (in): 63 Pulse (bpm): 83 Weight (lbs): 150 Respiratory Rate (breaths/min): 16 Body Mass Index (BMI): 26.6 Blood Pressure (mmHg): 161/91 Reference Range: 80 - 120 mg / dl Electronic Signature(s) Signed: 07/06/2020 5:46:28 PM By: Zandra AbtsLynch, Shatara RN, BSN Entered By: Zandra AbtsLynch, Shatara on 07/06/2020 12:54:16

## 2020-07-06 NOTE — Progress Notes (Signed)
Jamie, [name] ([id number]) Visit Report for 07/06/2020 HPI Details Patient Name: Date of Service: Jamie [name], [name] 07/06/2020 1:30 PM Medical Record Number: [id number] Patient Account Number: [id number] Date of Birth/Sex: Treating RN: 03-17-1941 (80 y.o. [name] Primary Care Provider: Hiram Gash RK, Lorenso Quarry Other Clinician: Referring Provider: Treating Provider/Extender: Diamantina Monks N-CLA RK, V A LENICA Weeks in Treatment: 0 History of Present Illness HPI Description: ADMISSION 06/30/2020 Jamie [name] is an 80 year old woman who lives in [place]. She is here with her niece for review of wounds on the left medial lower leg and ankle. These have apparently been present for over a year and she followed with Dr. Olegario Messier at the [facility] in [place] for quite a period of time although it looks as though there was a initial consult wound from Dr. Marcha Solders on February 18 presumably there was therefore hiatus. At that point the wounds were described as being there for 3 months. She also tells me she was at the wound care center in [place] for a period of time with this. 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 She had venous studies that were negative for DVT ABIs on the right were 1.01 on the left 1.06. She has . had previous applications of puraply, compression which she does not tolerate very well. She has had several rounds of oral antibiotic therapy. She complains of unrelenting pain and she is seeing Dr. Reece Agar V of pain management apparently was on oxycodone but that did not help. She also had a skin biopsy done by Dr. Olegario Messier although we do not have that result. She does not appear to have an arterial issue. I am not completely clear what she has been putting on the wounds lately. 3/31; this patient has a particularly nasty set of wounds on the left medial ankle in the middle of what looks to be  hemosiderin deposition secondary to chronic venous insufficiency. She has a lot of pain followed by pain management. Dr. Marcha Solders apparently did a biopsy of something on the left leg last fall what I would like to get this result. She has a history of MRSA treatment. I do not believe she had reflux studies but she did have DVT rule out studies. Previous ABIs have not suggested arterial insufficiency Electronic Signature(s) Signed: 07/06/2020 5:43:02 PM By: [name] MD Entered By: [name] on 07/06/2020 13:35:19 -------------------------------------------------------------------------------- Physical Exam Details Patient Name: Date of Service: Jamie [name], [name] 07/06/2020 1:30 PM Medical Record Number: [id number] Patient Account Number: [id number] Date of Birth/Sex: Treating RN: 06/12/1940 (80 y.o. [name] Primary Care Provider: Hiram Gash RK, Lorenso Quarry Other Clinician: Referring Provider: Treating Provider/Extender: Diamantina Monks N-CLA RK, V A LENICA Weeks in Treatment: 0 Constitutional Patient is hypertensive.. Pulse regular and within target range for patient.Marland Kitchen Respirations regular, non-labored and within target range.. Temperature is normal and within the target range for the patient.Marland Kitchen Appears in no distress. Notes Wound exam; the patient has 4 open areas in close juxtaposition on the left medial lower leg 1 on the Achilles and 3 others. Each one of them covered in some degree of adherent debris some of this washes off I debrided it a bit last week but she did not tolerate this well because of pain. Her dorsalis pedis pulses palpable. I used injectable lidocaine to anesthetize a small surface area used a #5 curette to remove a deep tissue amount  of the granulation for PCR culture given the complaints of drainage and odor Electronic Signature(s) Signed: 07/06/2020 5:43:02 PM By: [name] MD Entered By: [name] on 07/06/2020  13:36:40 -------------------------------------------------------------------------------- Physician Orders Details Patient Name: Date of Service: Jamie [name], [name] 07/06/2020 1:30 PM Medical Record Number: [id number] Patient Account Number: [id number] Date of Birth/Sex: Treating RN: 11-10-1940 (80 y.o. 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 - thursday Return A Bathing/ Shower/ Hygiene May shower with protection but do not get wound dressing(s) wet. Edema Control - Lymphedema / SCD / Other Elevate legs to the level of the heart or above for 30 minutes daily and/or when sitting, a frequency of: - throughout the day 3-4 times a day. Avoid standing for long periods of time. Exercise regularly Additional Orders / Instructions Follow Nutritious Diet Home Health New wound care orders this week; continue Home Health for wound care. May utilize formulary equivalent dressing for wound treatment orders unless otherwise specified. - [facility] fax #[phone]; Wound center weekly on Thursdays and home health to change on Mondays. Other Home Health Orders/Instructions: Wound Treatment Wound #1 - Lower Leg Wound Laterality: Left, Medial, Proximal Cleanser: Soap and Water (Home Health) 2 x Per Week/30 Days Discharge Instructions: May shower and wash wound with dial antibacterial soap and water prior to dressing change. Cleanser: Wound Cleanser (Home Health) 2 x Per Week/30 Days Discharge  Instructions: Cleanse the wound with wound cleanser prior to applying a clean dressing using gauze sponges, not tissue or cotton balls. Peri-Wound Care: Triamcinolone 15 (g) 2 x Per Week/30 Days Discharge Instructions: In clinic only. Apply to red, irritated periwound Peri-Wound Care: Sween Lotion (Moisturizing lotion) (Home Health) 2 x Per Week/30 Days Discharge Instructions: apply at home. Apply moisturizing lotion as directed Topical: Skintegrity Hydrogel 4 (oz) (Home Health) 2 x Per Week/30 Days Discharge Instructions: Apply hydrogel to wound Prim Dressing: Cutimed Sorbact Swab (Home Health) 2 x Per Week/30 Days ary Discharge Instructions: Apply to wound with Hydrogel Secondary Dressing: Woven Gauze Sponge, Non-Sterile 4x4 in (Home Health) 2 x Per Week/30 Days Discharge Instructions: Apply over primary dressing as directed. Secondary Dressing: ABD Pad, 8x10 (Home Health) 2 x Per Week/30 Days Discharge Instructions: Apply over primary dressing as directed. Secondary Dressing: Zetuvit Plus 4x8 in [facility]) 2 x Per Week/30 Days Discharge Instructions: Apply over primary dressing as directed. Secondary Dressing: CarboFLEX Odor Control Dressing, 4x4 in (Home Health) 2 x Per Week/30 Days Discharge Instructions: Apply over primary dressing as directed. Compression Wrap: ThreePress (3 layer compression wrap) 2 x Per Week/30 Days Discharge Instructions: Apply three layer compression as directed. Wound #2 - Lower Leg Wound Laterality: Left, Medial Cleanser: Soap and Water (Home Health) 2 x Per Week/30 Days Discharge Instructions: May shower and wash wound with dial antibacterial soap and water prior to dressing change. Cleanser: Wound Cleanser (Home Health) 2 x Per Week/30 Days Discharge Instructions: Cleanse the wound with wound cleanser prior to applying a clean dressing using gauze sponges, not tissue or cotton balls. Peri-Wound Care: Triamcinolone 15 (g) 2 x Per Week/30 Days Discharge  Instructions:  In clinic only. Apply to red, irritated periwound Peri-Wound Care: Sween Lotion (Moisturizing lotion) (Home Health) 2 x Per Week/30 Days Discharge Instructions: apply at home. Apply moisturizing lotion as directed Topical: Skintegrity Hydrogel 4 (oz) (Home Health) 2 x Per Week/30 Days Discharge Instructions: Apply hydrogel to wound Prim Dressing: Cutimed Sorbact Swab (Home Health) 2 x Per Week/30 Days ary Discharge Instructions: Apply to wound with Hydrogel Secondary Dressing: Woven Gauze Sponge, Non-Sterile 4x4 in (Home Health) 2 x Per Week/30 Days Discharge Instructions: Apply over primary dressing as directed. Secondary Dressing: ABD Pad, 8x10 (Home Health) 2 x Per Week/30 Days Discharge Instructions: Apply over primary dressing as directed. Secondary Dressing: Zetuvit Plus 4x8 in [facility]) 2 x Per Week/30 Days Discharge Instructions: Apply over primary dressing as directed. Secondary Dressing: CarboFLEX Odor Control Dressing, 4x4 in (Home Health) 2 x Per Week/30 Days Discharge Instructions: Apply over primary dressing as directed. Compression Wrap: ThreePress (3 layer compression wrap) 2 x Per Week/30 Days Discharge Instructions: Apply three layer compression as directed. Wound #3 - Lower Leg Wound Laterality: Left, Medial, Distal Cleanser: Soap and Water (Home Health) 2 x Per Week/30 Days Discharge Instructions: May shower and wash wound with dial antibacterial soap and water prior to dressing change. Cleanser: Wound Cleanser (Home Health) 2 x Per Week/30 Days Discharge Instructions: Cleanse the wound with wound cleanser prior to applying a clean dressing using gauze sponges, not tissue or cotton balls. Peri-Wound Care: Triamcinolone 15 (g) 2 x Per Week/30 Days Discharge Instructions: In clinic only. Apply to red, irritated periwound Peri-Wound Care: Sween Lotion (Moisturizing lotion) (Home Health) 2 x Per Week/30 Days Discharge Instructions: apply at home. Apply  moisturizing lotion as directed Topical: Skintegrity Hydrogel 4 (oz) (Home Health) 2 x Per Week/30 Days Discharge Instructions: Apply hydrogel to wound Prim Dressing: Cutimed Sorbact Swab (Home Health) 2 x Per Week/30 Days ary Discharge Instructions: Apply to wound with Hydrogel Secondary Dressing: Woven Gauze Sponge, Non-Sterile 4x4 in (Home Health) 2 x Per Week/30 Days Discharge Instructions: Apply over primary dressing as directed. Secondary Dressing: ABD Pad, 8x10 (Home Health) 2 x Per Week/30 Days Discharge Instructions: Apply over primary dressing as directed. Secondary Dressing: Zetuvit Plus 4x8 in [facility]) 2 x Per Week/30 Days Discharge Instructions: Apply over primary dressing as directed. Secondary Dressing: CarboFLEX Odor Control Dressing, 4x4 in (Home Health) 2 x Per Week/30 Days Discharge Instructions: Apply over primary dressing as directed. Compression Wrap: ThreePress (3 layer compression wrap) 2 x Per Week/30 Days Discharge Instructions: Apply three layer compression as directed. Wound #4 - Lower Leg Wound Laterality: Left, Posterior Cleanser: Soap and Water (Home Health) 2 x Per Week/30 Days Discharge Instructions: May shower and wash wound with dial antibacterial soap and water prior to dressing change. Cleanser: Wound Cleanser (Home Health) 2 x Per Week/30 Days Discharge Instructions: Cleanse the wound with wound cleanser prior to applying a clean dressing using gauze sponges, not tissue or cotton balls. Peri-Wound Care: Triamcinolone 15 (g) 2 x Per Week/30 Days Discharge Instructions: In clinic only. Apply to red, irritated periwound Peri-Wound Care: Sween Lotion (Moisturizing lotion) (Home Health) 2 x Per Week/30 Days Discharge Instructions: apply at home. Apply moisturizing lotion as directed Topical: Skintegrity Hydrogel 4 (oz) (Home Health) 2 x Per Week/30 Days Discharge Instructions: Apply hydrogel to wound Prim Dressing: Cutimed Sorbact Swab (Home Health) 2  x Per Week/30 Days ary Discharge Instructions: Apply to wound with Hydrogel Secondary Dressing: Woven Gauze Sponge, Non-Sterile 4x4 in (Home Health) 2 x Per Week/30  Days Discharge Instructions: Apply over primary dressing as directed. Secondary Dressing: ABD Pad, 8x10 (Home Health) 2 x Per Week/30 Days Discharge Instructions: Apply over primary dressing as directed. Secondary Dressing: Zetuvit Plus 4x8 in [facility](Home Health) 2 x Per Week/30 Days Discharge Instructions: Apply over primary dressing as directed. Secondary Dressing: CarboFLEX Odor Control Dressing, 4x4 in (Home Health) 2 x Per Week/30 Days Discharge Instructions: Apply over primary dressing as directed. Compression Wrap: ThreePress (3 layer compression wrap) 2 x Per Week/30 Days Discharge Instructions: Apply three layer compression as directed. Laboratory naerobe culture (MICRO) - PCR left medial distal lower leg wound. Deep tissue culture. Bacteria identified in Unspecified specimen by A LOINC Code: 635-3 Convenience Name: Anerobic culture Electronic Signature(s) Signed: 07/06/2020 5:43:02 PM By: Baltazar Najjarobson, Valery Amedee MD Signed: 07/06/2020 5:47:39 PM By: Shawn Stalleaton, Bobbi Entered By: Shawn Stalleaton, Bobbi on 07/06/2020 13:32:19 -------------------------------------------------------------------------------- Problem List Details Patient Name: Date of Service: Jamie BrinkWILSO N, Cheree DittoMA RGEURITE W. 07/06/2020 1:30 PM Medical Record Number: [id number] Patient Account Number: [id number] Date of Birth/Sex: Treating RN: 09/08/1940 (80 y.o. 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 07/06/2020 1:30 PM Medical Record Number: [id number] Patient Account Number: [id number] Date of Birth/Sex: Treating RN: 03/19/1941 (80 y.o. [name]) Deaton, Bobbi Primary Care Provider: Hiram GashEGGLESTO N-CLA RK, Lorenso QuarryV A LENICA Other Clinician: Referring Provider: Treating Provider/Extender: Diamantina Monksobson, Jeriel Vivanco EGGLESTO N-CLA RK, V A LENICA Weeks in Treatment: 0 Subjective History of Present Illness (HPI) ADMISSION 06/30/2020 [name] is an 80 year old woman who lives in [place]. She is here with her niece for review of wounds on the left medial lower leg and ankle. These have apparently been present for over a year and she followed with Dr. Olegario MessierKathy at the [facility] in [place] for quite a period of time although it looks as though there was a initial consult wound from Dr. Marcha Soldersathey on February 18 presumably there was therefore hiatus. At that point the wounds were described as being there for 3 months. She also tells me she was at the [place] care [place] for a [place] of time with this. 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 She had venous studies that were negative for DVT ABIs on the right were 1.01 on the left 1.06. She has . had previous applications of puraply, compression which she does not tolerate very well. She has had several rounds of oral antibiotic therapy. She complains of unrelenting pain and she is seeing Dr. Reece AgarEspy V of pain management apparently was on oxycodone but that did not help. She also had a skin biopsy done by Dr. Olegario MessierKathy  although we do not have that result. She does not appear to have an arterial issue. I am not completely clear what she has been putting on the wounds lately. 3/31; this patient has a particularly nasty set of wounds on the left medial ankle in the middle of what  looks to be hemosiderin deposition secondary to chronic venous insufficiency. She has a lot of pain followed by pain management. Dr. Marcha Solders apparently did a biopsy of something on the left leg last fall what I would like to get this result. She has a history of MRSA treatment. I do not believe she had reflux studies but she did have DVT rule out studies. Previous ABIs have not suggested arterial insufficiency Objective Constitutional Patient is hypertensive.. Pulse regular and within target range for patient.Marland Kitchen Respirations regular, non-labored and within target range.. Temperature is normal and within the target range for the patient.Marland Kitchen Appears in no distress. Vitals Time Taken: 12:53 PM, Height: 63 in, Weight: 150 lbs, BMI: 26.6, Temperature: 98.3 F, Pulse: 83 bpm, Respiratory Rate: 16 breaths/min, Blood Pressure: 161/91 mmHg. General Notes: Wound exam; the patient has 4 open areas in close juxtaposition on the left medial lower leg 1 on the Achilles and 3 others. Each one of them covered in some degree of adherent debris some of this washes off I debrided it a bit last week but she did not tolerate this well because of pain. Her dorsalis pedis pulses palpable. I used injectable lidocaine to anesthetize a small surface area used a #5 curette to remove a deep tissue amount of the granulation for PCR culture given the complaints of drainage and odor Integumentary (Hair, Skin) Wound #1 status is Open. Original cause of wound was Gradually Appeared. The date acquired was: 04/09/2019. The wound is located on the Left,Proximal,Medial Lower Leg. The wound measures 1.1cm length x 1.6cm width x 0.1cm depth; 1.382cm^2 area and 0.138cm^3 volume. There  is Fat Layer (Subcutaneous Tissue) exposed. There is no tunneling or undermining noted. There is a medium amount of purulent drainage noted. The wound margin is flat and intact. There is small (1-33%) pink granulation within the wound bed. There is a large (67-100%) amount of necrotic tissue within the wound bed including Adherent Slough. Wound #2 status is Open. Original cause of wound was Gradually Appeared. The date acquired was: 04/09/2019. The wound is located on the Left,Medial Lower Leg. The wound measures 2cm length x 1.9cm width x 0.2cm depth; 2.985cm^2 area and 0.597cm^3 volume. There is Fat Layer (Subcutaneous Tissue) exposed. There is no tunneling or undermining noted. There is a medium amount of serosanguineous drainage noted. The wound margin is flat and intact. There is medium (34-66%) red granulation within the wound bed. There is a medium (34-66%) amount of necrotic tissue within the wound bed including Adherent Slough. Wound #3 status is Open. Original cause of wound was Gradually Appeared. The date acquired was: 04/09/2019. The wound is located on the Left,Distal,Medial Lower Leg. The wound measures 4.5cm length x 2.5cm width x 0.2cm depth; 8.836cm^2 area and 1.767cm^3 volume. There is Fat Layer (Subcutaneous Tissue) exposed. There is no tunneling or undermining noted. There is a medium amount of purulent drainage noted. The wound margin is flat and intact. There is large (67-100%) red granulation within the wound bed. There is a small (1-33%) amount of necrotic tissue within the wound bed including Adherent Slough. Wound #4 status is Open. Original cause of wound was Gradually Appeared. The date acquired was: 04/09/2019. The wound is located on the Left,Posterior Lower Leg. The wound measures 1.5cm length x 0.9cm width x 0.2cm depth; 1.06cm^2 area and 0.212cm^3 volume. There is Fat Layer (Subcutaneous Tissue) exposed. There is no tunneling or undermining noted. There is a medium amount of  purulent drainage noted. The wound margin is flat  and intact. There is small (1-33%) pink granulation within the wound bed. There is a large (67-100%) amount of necrotic tissue within the wound bed including Adherent Slough. 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 There was a Three Layer Compression Therapy Procedure by Zenaida Deed, RN. Post procedure Diagnosis Wound #1: Same as Pre-Procedure Wound #2 Pre-procedure diagnosis of Wound #2 is a Venous Leg Ulcer located on the Left,Medial Lower Leg . There was a Three Layer Compression Therapy Procedure by Zenaida Deed, RN. Post procedure Diagnosis Wound #2: Same as Pre-Procedure Wound #3 Pre-procedure diagnosis of Wound #3 is a Venous Leg Ulcer located on the Left,Distal,Medial Lower Leg . There was a Three Layer Compression Therapy Procedure by Zenaida Deed, RN. Post procedure Diagnosis Wound #3: Same as Pre-Procedure Wound #4 Pre-procedure diagnosis of Wound #4 is a Venous Leg Ulcer located on the Left,Posterior Lower Leg . There was a Three Layer Compression Therapy Procedure by Zenaida Deed, RN. Post procedure Diagnosis Wound #4: Same as Pre-Procedure Plan Follow-up Appointments: Return Appointment in 1 week. - thursday Bathing/ Shower/ Hygiene: May shower with protection but do not get wound dressing(s) wet. Edema Control - Lymphedema / SCD / Other: Elevate legs to the level of the heart or above for 30 minutes daily and/or when sitting, a frequency of: - throughout the day 3-4 times a day. Avoid standing for long periods of time. Exercise regularly Additional Orders / Instructions: Follow Nutritious Diet Home  Health: New wound care orders this week; continue Home Health for wound care. May utilize formulary equivalent dressing for wound treatment orders unless otherwise specified. - [facility] fax #[phone]; Wound center weekly on Thursdays and home health to change on Mondays. Other Home Health Orders/Instructions: Laboratory ordered were: Anerobic culture- PCR left leg - PCR left medial distal lower leg wound. Deep tissue culture. WOUND #1: - Lower Leg Wound Laterality: Left, Medial, Proximal Cleanser: Soap and Water (Home Health) 2 x Per Week/30 Days Discharge Instructions: May shower and wash wound with dial antibacterial soap and water prior to dressing change. Cleanser: Wound Cleanser (Home Health) 2 x Per Week/30 Days Discharge Instructions: Cleanse the wound with wound cleanser prior to applying a clean dressing using gauze sponges, not tissue or cotton balls. Peri-Wound Care: Triamcinolone 15 (g) 2 x Per Week/30 Days Discharge Instructions: In clinic only. Apply to red, irritated periwound Peri-Wound Care: Sween Lotion (Moisturizing lotion) (Home Health) 2 x Per Week/30 Days Discharge Instructions: apply at home. Apply moisturizing lotion as directed Topical: Skintegrity Hydrogel 4 (oz) (Home Health) 2 x Per Week/30 Days Discharge Instructions: Apply hydrogel to wound Prim Dressing: Cutimed Sorbact Swab (Home Health) 2 x Per Week/30 Days ary Discharge Instructions: Apply to wound with Hydrogel Secondary Dressing: Woven Gauze Sponge, Non-Sterile 4x4 in (Home Health) 2 x Per Week/30 Days Discharge Instructions: Apply over primary dressing as directed. Secondary Dressing: ABD Pad, 8x10 (Home Health) 2 x Per Week/30 Days Discharge Instructions: Apply over primary dressing as directed. Secondary Dressing: Zetuvit Plus 4x8 in [facility]) 2 x Per Week/30 Days Discharge Instructions: Apply over primary dressing as directed. Secondary Dressing: CarboFLEX Odor Control  Dressing, 4x4 in (Home Health) 2 x Per Week/30 Days Discharge Instructions: Apply over primary dressing as directed. Com pression  Wrap: ThreePress (3 layer compression wrap) 2 x Per Week/30 Days Discharge Instructions: Apply three layer compression as directed. WOUND #2: - Lower Leg Wound Laterality: Left, Medial Cleanser: Soap and Water (Home Health) 2 x Per Week/30 Days Discharge Instructions: May shower and wash wound with dial antibacterial soap and water prior to dressing change. Cleanser: Wound Cleanser (Home Health) 2 x Per Week/30 Days Discharge Instructions: Cleanse the wound with wound cleanser prior to applying a clean dressing using gauze sponges, not tissue or cotton balls. Peri-Wound Care: Triamcinolone 15 (g) 2 x Per Week/30 Days Discharge Instructions: In clinic only. Apply to red, irritated periwound Peri-Wound Care: Sween Lotion (Moisturizing lotion) (Home Health) 2 x Per Week/30 Days Discharge Instructions: apply at home. Apply moisturizing lotion as directed Topical: Skintegrity Hydrogel 4 (oz) (Home Health) 2 x Per Week/30 Days Discharge Instructions: Apply hydrogel to wound Prim Dressing: Cutimed Sorbact Swab (Home Health) 2 x Per Week/30 Days ary Discharge Instructions: Apply to wound with Hydrogel Secondary Dressing: Woven Gauze Sponge, Non-Sterile 4x4 in (Home Health) 2 x Per Week/30 Days Discharge Instructions: Apply over primary dressing as directed. Secondary Dressing: ABD Pad, 8x10 (Home Health) 2 x Per Week/30 Days Discharge Instructions: Apply over primary dressing as directed. Secondary Dressing: Zetuvit Plus 4x8 in [facility]) 2 x Per Week/30 Days Discharge Instructions: Apply over primary dressing as directed. Secondary Dressing: CarboFLEX Odor Control Dressing, 4x4 in (Home Health) 2 x Per Week/30 Days Discharge Instructions: Apply over primary dressing as directed. Com pression Wrap: ThreePress (3 layer compression wrap) 2 x Per Week/30 Days Discharge  Instructions: Apply three layer compression as directed. WOUND #3: - Lower Leg Wound Laterality: Left, Medial, Distal Cleanser: Soap and Water (Home Health) 2 x Per Week/30 Days Discharge Instructions: May shower and wash wound with dial antibacterial soap and water prior to dressing change. Cleanser: Wound Cleanser (Home Health) 2 x Per Week/30 Days Discharge Instructions: Cleanse the wound with wound cleanser prior to applying a clean dressing using gauze sponges, not tissue or cotton balls. Peri-Wound Care: Triamcinolone 15 (g) 2 x Per Week/30 Days Discharge Instructions: In clinic only. Apply to red, irritated periwound Peri-Wound Care: Sween Lotion (Moisturizing lotion) (Home Health) 2 x Per Week/30 Days Discharge Instructions: apply at home. Apply moisturizing lotion as directed Topical: Skintegrity Hydrogel 4 (oz) (Home Health) 2 x Per Week/30 Days Discharge Instructions: Apply hydrogel to wound Prim Dressing: Cutimed Sorbact Swab (Home Health) 2 x Per Week/30 Days ary Discharge Instructions: Apply to wound with Hydrogel Secondary Dressing: Woven Gauze Sponge, Non-Sterile 4x4 in (Home Health) 2 x Per Week/30 Days Discharge Instructions: Apply over primary dressing as directed. Secondary Dressing: ABD Pad, 8x10 (Home Health) 2 x Per Week/30 Days Discharge Instructions: Apply over primary dressing as directed. Secondary Dressing: Zetuvit Plus 4x8 in [facility]) 2 x Per Week/30 Days Discharge Instructions: Apply over primary dressing as directed. Secondary Dressing: CarboFLEX Odor Control Dressing, 4x4 in (Home Health) 2 x Per Week/30 Days Discharge Instructions: Apply over primary dressing as directed. Com pression Wrap: ThreePress (3 layer compression wrap) 2 x Per Week/30 Days Discharge Instructions: Apply three layer compression as directed. WOUND #4: - Lower Leg Wound Laterality: Left, Posterior Cleanser: Soap and Water (Home Health) 2 x Per Week/30 Days Discharge Instructions:  May shower and wash wound with dial antibacterial soap and water prior to dressing change. Cleanser: Wound Cleanser (Home Health) 2 x Per Week/30 Days Discharge Instructions: Cleanse the wound with wound cleanser prior to applying a clean dressing using  gauze sponges, not tissue or cotton balls. Peri-Wound Care: Triamcinolone 15 (g) 2 x Per Week/30 Days Discharge Instructions: In clinic only. Apply to red, irritated periwound Peri-Wound Care: Sween Lotion (Moisturizing lotion) (Home Health) 2 x Per Week/30 Days Discharge Instructions: apply at home. Apply moisturizing lotion as directed Topical: Skintegrity Hydrogel 4 (oz) (Home Health) 2 x Per Week/30 Days Discharge Instructions: Apply hydrogel to wound Prim Dressing: Cutimed Sorbact Swab (Home Health) 2 x Per Week/30 Days ary Discharge Instructions: Apply to wound with Hydrogel Secondary Dressing: Woven Gauze Sponge, Non-Sterile 4x4 in (Home Health) 2 x Per Week/30 Days Discharge Instructions: Apply over primary dressing as directed. Secondary Dressing: ABD Pad, 8x10 (Home Health) 2 x Per Week/30 Days Discharge Instructions: Apply over primary dressing as directed. Secondary Dressing: Zetuvit Plus 4x8 in [facility]) 2 x Per Week/30 Days Discharge Instructions: Apply over primary dressing as directed. Secondary Dressing: CarboFLEX Odor Control Dressing, 4x4 in (Home Health) 2 x Per Week/30 Days Discharge Instructions: Apply over primary dressing as directed. Com pression Wrap: ThreePress (3 layer compression wrap) 2 x Per Week/30 Days Discharge Instructions: Apply three layer compression as directed. #1 I am still going with Sorbac. Were going to see if home health can change this once 2. PCR culture. Based on the results of this I will consider either systemic or topical antibiotics perhaps both 3. 3 layer compression. Electronic Signature(s) Signed: 07/06/2020 5:43:02 PM By: [name] MD Signed: 07/06/2020 5:43:02 PM By: [name] MD Entered By: [name] on 07/06/2020 13:37:55 -------------------------------------------------------------------------------- SuperBill Details Patient Name: Date of Service: Jamie [name], Terald Sleeper. 07/06/2020 Medical Record Number: [id number] Patient Account Number: [id number] Date of Birth/Sex: Treating RN: Oct 08, 1940 (80 y.o. 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 Facility Procedures CPT4 Code: [id number] Description: (Facility Use Only) [phone] - APPLY MULTLAY COMPRS LWR LT LEG Modifier: Quantity: 1 Physician Procedures : CPT4 Code Description Modifier [id number] - WC PHYS LEVEL 3 - EST PT ICD-10 Diagnosis Description L97.922 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

## 2020-07-07 ENCOUNTER — Encounter (HOSPITAL_BASED_OUTPATIENT_CLINIC_OR_DEPARTMENT_OTHER): Payer: 59 | Admitting: Internal Medicine

## 2020-07-13 ENCOUNTER — Other Ambulatory Visit: Payer: Self-pay

## 2020-07-13 ENCOUNTER — Encounter (HOSPITAL_BASED_OUTPATIENT_CLINIC_OR_DEPARTMENT_OTHER): Payer: Medicare Other | Attending: Internal Medicine | Admitting: Internal Medicine

## 2020-07-13 DIAGNOSIS — L97922 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: Secondary | ICD-10-CM | POA: Diagnosis not present

## 2020-07-13 DIAGNOSIS — L97829 Non-pressure chronic ulcer of other part of left lower leg with unspecified severity: Secondary | ICD-10-CM | POA: Diagnosis present

## 2020-07-13 DIAGNOSIS — I87332 Chronic venous hypertension (idiopathic) with ulcer and inflammation of left lower extremity: Secondary | ICD-10-CM | POA: Diagnosis not present

## 2020-07-13 NOTE — Progress Notes (Signed)
KALANDRA, MASTERS ([id number]) Visit Report for 07/13/2020 Fall Risk Assessment Details Patient Name: Date of Service: Jamie [name], Jamie [name]. 07/13/2020 1:30 PM Medical Record Number: [id number] Patient Account Number: [id number] Date of Birth/Sex: Treating RN: 05-01-1940 (80 y.o. [name] Primary Care Ahtziri Jeffries: Hiram Gash RK, Lorenso Quarry Other Clinician: Referring Sirius Woodford: Treating Majesta Leichter/Extender: Diamantina Monks N-CLA RK, V A LENICA Weeks in Treatment: 1 Fall Risk Assessment Items Have you had 2 or more falls in the last 12 monthso 0 Yes Have you had any fall that resulted in injury in the last 12 monthso 0 No FALLS RISK SCREEN History of falling - immediate or within 3 months 25 Yes Secondary diagnosis (Do you have 2 or more medical diagnoseso) 0 No Ambulatory aid None/bed rest/wheelchair/nurse 0 No Crutches/cane/walker 15 Yes Furniture 0 No Intravenous therapy Access/Saline/Heparin Lock 0 No Gait/Transferring Normal/ bed rest/ wheelchair 0 No Weak (short steps with or without shuffle, stooped but able to lift head while walking, may seek 10 Yes support from furniture) Impaired (short steps with shuffle, may have difficulty arising from chair, head down, impaired 0 No balance) Mental Status Oriented to own ability 0 Yes Electronic Signature(s) Signed: 07/13/2020 5:35:17 PM By: [name] Entered By: [name] on 07/13/2020 13:40:19

## 2020-07-14 NOTE — Progress Notes (Signed)
[name] BuzzardWILSON, MARGEURITE W. ([id number]) Visit Report for 07/13/2020 Arrival Information Details Patient Name: Date of Service: Jamie BrinkWILSO N, [place] RGEURITE W. 07/13/2020 1:30 PM Medical Record Number: [id number] Patient Account Number: [id number] Date of Birth/Sex: Treating RN: 03/11/1941 (80 y.o. Roel CluckF) Barnhart, Jodi Primary Care Early Steel: Hiram GashEGGLESTO N-CLA RK, Lorenso QuarryV A LENICA Other Clinician: Referring Delorice Bannister: Treating Janila Arrazola/Extender: Diamantina Monksobson, Michael EGGLESTO N-CLA RK, V A LENICA Weeks in Treatment: 1 Visit Information History Since Last Visit Added or deleted any medications: No Patient Arrived: Wheel Chair Any new allergies or adverse reactions: No Arrival Time: 13:33 Had a fall or experienced change in Yes Accompanied By: niece activities of daily living that may affect Transfer Assistance: Manual risk of falls: Patient Identification Verified: Yes Signs or symptoms of abuse/neglect since No Secondary Verification Process Completed: Yes last visito Patient Requires Transmission-Based Precautions: No Hospitalized since last visit: No Patient Has Alerts: Yes Implantable device outside of the clinic No Patient Alerts: L ABI: 1.06; R ABI: 1.01 excluding cellular tissue based products placed in the center since last visit: Has Dressing in Place as Prescribed: Yes Has Compression in Place as Prescribed: Yes Has Footwear/Offloading in Place as Yes Prescribed: Left: Surgical Shoe with Pressure Relief Insole Pain Present Now: Yes Electronic Signature(s) Signed: 07/13/2020 5:35:17 PM By: Antonieta IbaBarnhart, Jodi Entered By: Antonieta IbaBarnhart, Jodi on 07/13/2020 13:50:43 -------------------------------------------------------------------------------- Compression Therapy Details Patient Name: Date of Service: Jamie BrinkWILSO N, Cheree DittoMA RGEURITE W. 07/13/2020 1:30 PM Medical Record Number: [id number] Patient Account Number: [id number] Date of Birth/Sex: Treating RN: 10/19/1940 (80 y.o. [name]) Deaton, Bobbi Primary Care Rajesh Wyss: Hiram GashEGGLESTO N-CLA  RK, Lorenso QuarryV A LENICA Other Clinician: Referring Tonae Livolsi: Treating Zaire Vanbuskirk/Extender: Diamantina Monksobson, Michael EGGLESTO N-CLA RK, V A LENICA Weeks in Treatment: 1 Compression Therapy Performed for Wound Assessment: Wound #3 Left,Distal,Medial Lower Leg Performed By: Clinician Zenaida DeedBoehlein, Linda, RN Compression Type: Three Layer Post Procedure Diagnosis Same as Pre-procedure Electronic Signature(s) Signed: 07/14/2020 4:53:51 PM By: Shawn Stalleaton, Bobbi Entered By: Shawn Stalleaton, Bobbi on 07/13/2020 13:59:44 -------------------------------------------------------------------------------- Compression Therapy Details Patient Name: Date of Service: Jamie BrinkWILSO N, Cheree DittoMA RGEURITE W. 07/13/2020 1:30 PM Medical Record Number: [id number] Patient Account Number: [id number] Date of Birth/Sex: Treating RN: 07/04/1940 (80 y.o. [name]) Deaton, Bobbi Primary Care Karlye Ihrig: Hiram GashEGGLESTO N-CLA RK, Lorenso QuarryV A LENICA Other Clinician: Referring Nykolas Bacallao: Treating Savina Olshefski/Extender: Diamantina Monksobson, Michael EGGLESTO N-CLA RK, V A LENICA Weeks in Treatment: 1 Compression Therapy Performed for Wound Assessment: Wound #4 Left,Posterior Lower Leg Performed By: Clinician Zenaida DeedBoehlein, Linda, RN Compression Type: Three Layer Post Procedure Diagnosis Same as Pre-procedure Electronic Signature(s) Signed: 07/14/2020 4:53:51 PM By: Shawn Stalleaton, Bobbi Entered By: Shawn Stalleaton, Bobbi on 07/13/2020 13:59:44 -------------------------------------------------------------------------------- Encounter Discharge Information Details Patient Name: Date of Service: Jamie BrinkWILSO N, Cheree DittoMA RGEURITE W. 07/13/2020 1:30 PM Medical Record Number: [id number] Patient Account Number: [id number] Date of Birth/Sex: Treating RN: 04/27/1940 (80 y.o. Tommye StandardF) Boehlein, Linda Primary Care Lang Zingg: Harlow MaresEGGLESTO N-CLA RK, Lorenso QuarryV A LENICA Other Clinician: Referring Camaron Cammack: Treating Tayona Sarnowski/Extender: Diamantina Monksobson, Michael EGGLESTO N-CLA RK, V A LENICA Weeks in Treatment: 1 Encounter Discharge Information Items Discharge Condition:  Stable Ambulatory Status: Wheelchair Discharge Destination: Home Transportation: Private Auto Accompanied By: niece Schedule Follow-up Appointment: Yes Clinical Summary of Care: Patient Declined Electronic Signature(s) Signed: 07/13/2020 5:18:58 PM By: Zenaida DeedBoehlein, Linda RN, BSN Entered By: Zenaida DeedBoehlein, Linda on 07/13/2020 14:26:12 -------------------------------------------------------------------------------- Lower Extremity Assessment Details Patient Name: Date of Service: Jamie BrinkWILSO N, Cheree DittoMA RGEURITE W. 07/13/2020 1:30 PM Medical Record Number: [id number] Patient Account Number: [id number] Date of Birth/Sex: Treating RN: 07/26/1940 (80 y.o. Roel CluckF) Barnhart, Jodi Primary Care Coltin Casher: Vida RollerEGGLESTO N-CLA RK, Lorenso QuarryV A LENICA Other Clinician:  Referring Cecillia Menees: Treating Ebert Forrester/Extender: Diamantina Monks N-CLA RK, V A LENICA Weeks in Treatment: 1 Edema Assessment Assessed: [Left: Yes] [Right: No] Edema: [Left: Ye] [Right: s] Calf Left: Right: Point of Measurement: 29 cm From Medial Instep 34.5 cm Ankle Left: Right: Point of Measurement: 11 cm From Medial Instep 25 cm Electronic Signature(s) Signed: 07/13/2020 5:35:17 PM By: [name] Entered By: [name] on 07/13/2020 13:41:41 -------------------------------------------------------------------------------- Multi Wound Chart Details Patient Name: Date of Service: Jamie [name], [name] 07/13/2020 1:30 PM Medical Record Number: [id number] Patient Account Number: [id number] Date of Birth/Sex: Treating RN: 10/27/1940 (80 y.o. Debara Pickett, Millard.Loa Primary Care Rielly Brunn: Hiram Gash RK, Lorenso Quarry Other Clinician: Referring Samya Siciliano: Treating Shanikqua Zarzycki/Extender: Diamantina Monks N-CLA RK, V A LENICA Weeks in Treatment: 1 Vital Signs Height(in): 63 Pulse(bpm): 80 Weight(lbs): 150 Blood Pressure(mmHg): 155/96 Body Mass Index(BMI): 27 Temperature(F): 98.3 Respiratory Rate(breaths/min): 18 Photos: [1:No Photos Left, Proximal,  Medial Lower Leg] [2:No Photos Left, Medial Lower Leg] [3:No Photos Left, Distal, Medial Lower Leg] Wound Location: [1:Gradually Appeared] [2:Gradually Appeared] [3:Gradually Appeared] Wounding Event: [1:Venous Leg Ulcer] [2:Venous Leg Ulcer] [3:Venous Leg Ulcer] Primary Etiology: [1:Sleep Apnea, Hypertension, Peripheral Sleep Apnea, Hypertension, Peripheral Sleep Apnea, Hypertension, Peripheral] Comorbid History: [1:Venous Disease, Osteoarthritis, Neuropathy 04/09/2019] [2:Venous Disease, Osteoarthritis, Neuropathy 04/09/2019] [3:Venous Disease, Osteoarthritis, Neuropathy 04/09/2019] Date Acquired: [1:1] [2:1] [3:1] Weeks of Treatment: [1:Converted] [2:Converted] [3:Open] Wound Status: [1:N/A] [2:N/A] [3:9.5x2.9x0.2] Measurements L x W x D (cm) [1:N/A] [2:N/A] [3:21.638] A (cm) : rea [1:N/A] [2:N/A] [3:4.328] Volume (cm) : [1:N/A] [2:N/A] [3:-205.40%] % Reduction in Area: [1:N/A] [2:N/A] [3:-205.40%] % Reduction in Volume: [1:Full Thickness Without Exposed] [2:Full Thickness Without Exposed] [3:Full Thickness Without Exposed] Classification: [1:Support Structures Medium] [2:Support Structures Medium] [3:Support Structures Medium] Exudate Amount: [1:Purulent] [2:Serosanguineous] [3:Purulent] Exudate Type: [1:yellow, brown, green] [2:red, brown] [3:yellow, brown, green] Exudate Color: [1:Flat and Intact] [2:Flat and Intact] [3:Well defined, not attached] Wound Margin: [1:Small (1-33%)] [2:Medium (34-66%)] [3:Medium (34-66%)] Granulation Amount: [1:Pink] [2:Red] [3:Red] Granulation Quality: [1:Large (67-100%)] [2:Medium (34-66%)] [3:Medium (34-66%)] Necrotic Amount: [1:Fat Layer (Subcutaneous Tissue): Yes Fat Layer (Subcutaneous Tissue): Yes Fat Layer (Subcutaneous Tissue): Yes] Exposed Structures: [1:Fascia: No Tendon: No Muscle: No Joint: No Bone: No None] [2:Fascia: No Tendon: No Muscle: No Joint: No Bone: No None] [3:Fascia: No Tendon: No Muscle: No Joint: No Bone: No None] Epithelialization:  [1:N/A] [2:N/A] [3:Wounds 1 and 2 incorporated into one] Assessment Notes: [1:N/A] [2:N/A] [3:wound #3 Compression Therapy] Procedures Performed: [2:4 N/A] [3:N/A] Photos: [1:No Photos Left, Posterior Lower Leg] [2:N/A N/A] [3:N/A N/A] Wound Location: [1:Gradually Appeared] [2:N/A] [3:N/A] Wounding Event: [1:Venous Leg Ulcer] [2:N/A] [3:N/A] Primary Etiology: [1:Sleep Apnea, Hypertension, Peripheral N/A] [3:N/A] Comorbid History: [1:Venous Disease, Osteoarthritis, Neuropathy 04/09/2019] [2:N/A] [3:N/A] Date Acquired: [1:1] [2:N/A] [3:N/A] Weeks of Treatment: [1:Open] [2:N/A] [3:N/A] Wound Status: [1:1.7x1.5x0.2] [2:N/A] [3:N/A] Measurements L x W x D (cm) [1:2.003] [2:N/A] [3:N/A] A (cm) : rea [1:0.401] [2:N/A] [3:N/A] Volume (cm) : [1:-155.20%] [2:N/A] [3:N/A] % Reduction in Area: [1:-155.40%] [2:N/A] [3:N/A] % Reduction in Volume: [1:Full Thickness Without Exposed] [2:N/A] [3:N/A] Classification: [1:Support Structures Medium] [2:N/A] [3:N/A] Exudate Amount: [1:Purulent] [2:N/A] [3:N/A] Exudate Type: [1:yellow, brown, green] [2:N/A] [3:N/A] Exudate Color: [1:Well defined, not attached] [2:N/A] [3:N/A] Wound Margin: [1:Small (1-33%)] [2:N/A] [3:N/A] Granulation Amount: [1:Pink] [2:N/A] [3:N/A] Granulation Quality: [1:Large (67-100%)] [2:N/A] [3:N/A] Necrotic Amount: [1:Fat Layer (Subcutaneous Tissue): Yes N/A] [3:N/A] Exposed Structures: [1:Fascia: No Tendon: No Muscle: No Joint: No Bone: No None] [2:N/A] [3:N/A] Epithelialization: [1:N/A] [2:N/A] [3:N/A] Assessment Notes: [1:Compression Therapy] [2:N/A] [3:N/A] Treatment Notes Electronic Signature(s)  Signed: 07/13/2020 5:22:04 PM By: [name] MD Signed: 07/14/2020 4:53:51 PM By: Shawn Stall Entered By: [name] on 07/13/2020 14:11:34 -------------------------------------------------------------------------------- Multi-Disciplinary Care Plan Details Patient Name: Date of Service: Jamie Brink, [name] 07/13/2020  1:30 PM Medical Record Number: [id number] Patient Account Number: [id number] Date of Birth/Sex: Treating RN: 20-Nov-1940 (80 y.o. 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 Compression as ordered Notes: Wound/Skin Impairment Nursing Diagnoses: Impaired tissue integrity Goals: Patient/caregiver will verbalize understanding of skin care regimen Date Initiated: 06/30/2020 Target Resolution Date: 08/04/2020 Goal Status: Active Ulcer/skin breakdown will have a volume reduction of 30% by week 4 Date Initiated: 06/30/2020 Target Resolution Date: 08/04/2020 Goal Status: Active Interventions: Assess patient/caregiver ability to obtain necessary supplies Assess patient/caregiver ability to perform ulcer/skin care regimen upon admission and as needed Assess ulceration(s) every visit Provide education on ulcer and skin care Treatment Activities: Skin care regimen initiated : 06/30/2020 Topical wound management initiated : 06/30/2020 Notes: Electronic Signature(s) Signed: 07/14/2020 4:53:51 PM By: Shawn Stall Entered By: Shawn Stall on 07/13/2020 13:37:49 -------------------------------------------------------------------------------- Pain Assessment Details Patient Name: Date of Service: Jamie [name], [name] 07/13/2020 1:30 PM Medical Record Number: [id number] Patient Account Number: [id number] Date of Birth/Sex: Treating RN: 01-31-1941 (80 y.o. [name] Primary Care Lebron Nauert: Hiram Gash RK, Lorenso Quarry Other  Clinician: Referring Gorman Safi: Treating Emmet Messer/Extender: Diamantina Monks N-CLA RK, V A LENICA Weeks in Treatment: 1 Active Problems Location of Pain Severity and Description of Pain Patient Has Paino Yes Site Locations Pain Location: Generalized Pain, Pain in Ulcers With Dressing Change: Yes Duration of the Pain. Constant / Intermittento Intermittent Rate the pain. Current Pain Level: 9 Character of Pain Describe the Pain: Burning, Tender, Throbbing Pain Management and Medication Current Pain Management: Medication: Yes Cold Application: No Rest: Yes Massage: No Activity: No T.E.N.S.: No Heat Application: No Leg drop or elevation: No Is the Current Pain Management Adequate: Inadequate How does your wound impact your activities of daily livingo Sleep: Yes Bathing: No Appetite: No Relationship With Others: No Bladder Continence: No Emotions: No Bowel Continence: No Work: No Toileting: No Drive: No Dressing: No Hobbies: No Goals for Pain Management Has starting going to pain clinic Electronic Signature(s) Signed: 07/13/2020 5:35:17 PM By: [name] Entered By: [name] on 07/13/2020 13:41:29 -------------------------------------------------------------------------------- Patient/Caregiver Education Details Patient Name: Date of Service: [place] 4/7/2022andnbsp1:30 PM Medical Record Number: [id number] Patient Account Number: [id number] Date of Birth/Gender: Treating RN: 08/19/1940 (80 y.o. [name] Primary Care Physician: Hiram Gash RK, Lorenso Quarry Other Clinician: Referring Physician: Treating Physician/Extender: Diamantina Monks N-CLA RK, V A LENICA Weeks in Treatment: 1 Education Assessment Education Provided To: Patient Education Topics Provided Wound/Skin Impairment: Handouts: Skin Care Do's and Dont's Methods: Explain/Verbal Responses: Reinforcements needed Electronic Signature(s) Signed:  07/14/2020 4:53:51 PM By: Shawn Stall Entered By: Shawn Stall on 07/13/2020 13:37:59 -------------------------------------------------------------------------------- Wound Assessment Details Patient Name: Date of Service: Jamie [name], [name] 07/13/2020 1:30 PM Medical Record Number: [id number] Patient Account Number: [id number] Date of Birth/Sex: Treating RN: [birth year] (80 y.o. [name] Primary Care Arlayne Liggins: Vida Roller N-CLA RK, V A  LENICA Other Clinician: Referring Jaaziah Schulke: Treating Jonnie Truxillo/Extender: Diamantina Monks N-CLA RK, V A LENICA Weeks in Treatment: 1 Wound Status Wound Number: 1 Primary Venous Leg Ulcer Etiology: Etiology: Wound Location: Left, Proximal, Medial Lower Leg Wound Converted Wounding Event: Gradually Appeared Status: Date Acquired: 04/09/2019 Comorbid Sleep Apnea, Hypertension, Peripheral Venous Disease, Weeks Of Treatment: 1 History: Osteoarthritis, Neuropathy Clustered Wound: No Wound Measurements % Reduction in Area: % Reduction in Volume: Epithelialization: None Wound Description Classification: Full Thickness Without Exposed Support Structures Wound Margin: Flat and Intact Exudate Amount: Medium Exudate Type: Purulent Exudate Color: yellow, brown, green Foul Odor After Cleansing: No Slough/Fibrino Yes Wound Bed Granulation Amount: Small (1-33%) Exposed Structure Granulation Quality: Pink Fascia Exposed: No Necrotic Amount: Large (67-100%) Fat Layer (Subcutaneous Tissue) Exposed: Yes Necrotic Quality: Adherent Slough Tendon Exposed: No Muscle Exposed: No Joint Exposed: No Bone Exposed: No Electronic Signature(s) Signed: 07/13/2020 5:35:17 PM By: [name] Entered By: [name] on 07/13/2020 13:42:35 -------------------------------------------------------------------------------- Wound Assessment Details Patient Name: Date of Service: Jamie [name], [name] 07/13/2020 1:30 PM Medical Record Number:  [id number] Patient Account Number: [id number] Date of Birth/Sex: Treating RN: 02/23/1941 (80 y.o. [name] Primary Care Nitzia Perren: Hiram Gash RK, Lorenso Quarry Other Clinician: Referring Jazell Rosenau: Treating Anhthu Perdew/Extender: Diamantina Monks N-CLA RK, V A LENICA Weeks in Treatment: 1 Wound Status Wound Number: 2 Primary Venous Leg Ulcer Etiology: Wound Location: Left, Medial Lower Leg Wound Converted Wounding Event: Gradually Appeared Status: Date Acquired: 04/09/2019 Comorbid Sleep Apnea, Hypertension, Peripheral Venous Disease, Weeks Of Treatment: 1 History: Osteoarthritis, Neuropathy Clustered Wound: No Wound Measurements % Reduction in Area: % Reduction in Volume: Epithelialization: None Wound Description Classification: Full Thickness Without Exposed Support Structures Wound Margin: Flat and Intact Exudate Amount: Medium Exudate Type: Serosanguineous Exudate Color: red, brown Foul Odor After Cleansing: No Slough/Fibrino Yes Wound Bed Granulation Amount: Medium (34-66%) Exposed Structure Granulation Quality: Red Fascia Exposed: No Necrotic Amount: Medium (34-66%) Fat Layer (Subcutaneous Tissue) Exposed: Yes Necrotic Quality: Adherent Slough Tendon Exposed: No Muscle Exposed: No Joint Exposed: No Bone Exposed: No Electronic Signature(s) Signed: 07/13/2020 5:35:17 PM By: [name] Entered By: [name] on 07/13/2020 13:42:47 -------------------------------------------------------------------------------- Wound Assessment Details Patient Name: Date of Service: Jamie [name], [name] 07/13/2020 1:30 PM Medical Record Number: [id number] Patient Account Number: [id number] Date of Birth/Sex: Treating RN: [birth year] (80 y.o. [name] Primary Care Kalah Pflum: Hiram Gash RK, Lorenso Quarry Other Clinician: Referring Natiya Seelinger: Treating Larcenia Holaday/Extender: Diamantina Monks N-CLA RK, V A LENICA Weeks in Treatment: 1 Wound  Status Wound Number: 3 Primary Venous Leg Ulcer Etiology: Wound Location: Left, Distal, Medial Lower Leg Wound Open Wounding Event: Gradually Appeared Status: Date Acquired: 04/09/2019 Comorbid Sleep Apnea, Hypertension, Peripheral Venous Disease, Weeks Of Treatment: 1 History: Osteoarthritis, Neuropathy Clustered Wound: No Photos Wound Measurements Length: (cm) 9.5 Width: (cm) 2.9 Depth: (cm) 0.2 Area: (cm) 21.638 Volume: (cm) 4.328 % Reduction in Area: -205.4% % Reduction in Volume: -205.4% Epithelialization: None Tunneling: No Undermining: No Wound Description Classification: Full Thickness Without Exposed Support Structures Wound Margin: Well defined, not attached Exudate Amount: Medium Exudate Type: Purulent Exudate Color: yellow, brown, green Foul Odor After Cleansing: No Slough/Fibrino Yes Wound Bed Granulation Amount: Medium (34-66%) Exposed Structure Granulation Quality: Red Fascia Exposed: No Necrotic Amount: Medium (34-66%) Fat Layer (Subcutaneous Tissue) Exposed: Yes Necrotic Quality: Adherent Slough Tendon Exposed: No Muscle Exposed: No Joint Exposed: No Bone Exposed: No Assessment Notes Wounds 1 and 2 incorporated into one wound #3 Treatment Notes Wound #3 (  Lower Leg) Wound Laterality: Left, Medial, Distal Cleanser Soap and Water Discharge Instruction: May shower and wash wound with dial antibacterial soap and water prior to dressing change. Wound Cleanser Discharge Instruction: Cleanse the wound with wound cleanser prior to applying a clean dressing using gauze sponges, not tissue or cotton balls. Peri-Wound Care Triamcinolone 15 (g) Discharge Instruction: In clinic only. Apply to red, irritated periwound Sween Lotion (Moisturizing lotion) Discharge Instruction: apply at home. Apply moisturizing lotion as directed Topical Primary Dressing Cutimed Sorbact Swab Discharge Instruction: Apply to wound with Hydrogel Secondary Dressing Woven Gauze  Sponge, Non-Sterile 4x4 in Discharge Instruction: Apply over primary dressing as directed. ABD Pad, 8x10 Discharge Instruction: Apply over primary dressing as directed. Zetuvit Plus 4x8 in Discharge Instruction: Apply over primary dressing as directed. CarboFLEX Odor Control Dressing, 4x4 in Discharge Instruction: Apply over primary dressing as directed. Secured With Compression Wrap ThreePress (3 layer compression wrap) Discharge Instruction: Apply three layer compression as directed. Compression Stockings Add-Ons Electronic Signature(s) Signed: 07/13/2020 4:58:23 PM By: Karl Ito Signed: 07/13/2020 5:35:17 PM By: [name] Entered By: Karl Ito on 07/13/2020 16:42:45 -------------------------------------------------------------------------------- Wound Assessment Details Patient Name: Date of Service: Jamie [name], [name] 07/13/2020 1:30 PM Medical Record Number: [id number] Patient Account Number: [id number] Date of Birth/Sex: Treating RN: April 02, 1941 (80 y.o. [name] Primary Care Ailine Hefferan: Hiram Gash RK, Lorenso Quarry Other Clinician: Referring Annaleigh Steinmeyer: Treating Shaquon Gropp/Extender: Diamantina Monks N-CLA RK, V A LENICA Weeks in Treatment: 1 Wound Status Wound Number: 4 Primary Venous Leg Ulcer Etiology: Wound Location: Left, Posterior Lower Leg Wound Open Wounding Event: Gradually Appeared Status: Date Acquired: 04/09/2019 Comorbid Sleep Apnea, Hypertension, Peripheral Venous Disease, Weeks Of Treatment: 1 History: Osteoarthritis, Neuropathy Clustered Wound: No Photos Wound Measurements Length: (cm) 1.7 Width: (cm) 1.5 Depth: (cm) 0.2 Area: (cm) 2.003 Volume: (cm) 0.401 % Reduction in Area: -155.2% % Reduction in Volume: -155.4% Epithelialization: None Tunneling: No Undermining: No Wound Description Classification: Full Thickness Without Exposed Support Structures Wound Margin: Well defined, not attached Exudate Amount:  Medium Exudate Type: Purulent Exudate Color: yellow, brown, green Foul Odor After Cleansing: No Slough/Fibrino Yes Wound Bed Granulation Amount: Small (1-33%) Exposed Structure Granulation Quality: Pink Fascia Exposed: No Necrotic Amount: Large (67-100%) Fat Layer (Subcutaneous Tissue) Exposed: Yes Necrotic Quality: Adherent Slough Tendon Exposed: No Muscle Exposed: No Joint Exposed: No Bone Exposed: No Treatment Notes Wound #4 (Lower Leg) Wound Laterality: Left, Posterior Cleanser Soap and Water Discharge Instruction: May shower and wash wound with dial antibacterial soap and water prior to dressing change. Wound Cleanser Discharge Instruction: Cleanse the wound with wound cleanser prior to applying a clean dressing using gauze sponges, not tissue or cotton balls. Peri-Wound Care Triamcinolone 15 (g) Discharge Instruction: In clinic only. Apply to red, irritated periwound Sween Lotion (Moisturizing lotion) Discharge Instruction: apply at home. Apply moisturizing lotion as directed Topical Primary Dressing Cutimed Sorbact Swab Discharge Instruction: Apply to wound with Hydrogel Secondary Dressing Woven Gauze Sponge, Non-Sterile 4x4 in Discharge Instruction: Apply over primary dressing as directed. ABD Pad, 8x10 Discharge Instruction: Apply over primary dressing as directed. Zetuvit Plus 4x8 in Discharge Instruction: Apply over primary dressing as directed. CarboFLEX Odor Control Dressing, 4x4 in Discharge Instruction: Apply over primary dressing as directed. Secured With Compression Wrap ThreePress (3 layer compression wrap) Discharge Instruction: Apply three layer compression as directed. Compression Stockings Add-Ons Electronic Signature(s) Signed: 07/13/2020 4:58:23 PM By: Karl Ito Signed: 07/13/2020 5:35:17 PM By: [name] Entered By: Karl Ito on 07/13/2020  16:41:45 --------------------------------------------------------------------------------  Vitals Details Patient Name: Date of Service: Jamie [name], [name] 07/13/2020 1:30 PM Medical Record Number: [id number] Patient Account Number: [id number] Date of Birth/Sex: Treating RN: 1940/07/09 (80 y.o. [name] Primary Care Merrell Jarboe: Hiram Gash RK, Lorenso Quarry Other Clinician: Referring Rilie Glanz: Treating Pharrah Rottman/Extender: Diamantina Monks N-CLA RK, V A LENICA Weeks in Treatment: 1 Vital Signs Time Taken: 13:50 Temperature (F): 98.3 Height (in): 63 Pulse (bpm): 80 Weight (lbs): 150 Respiratory Rate (breaths/min): 18 Body Mass Index (BMI): 26.6 Blood Pressure (mmHg): 155/96 Reference Range: 80 - 120 mg / dl Electronic Signature(s) Signed: 07/13/2020 5:35:17 PM By: [name] Entered By: [name] on 07/13/2020 13:50:29

## 2020-07-14 NOTE — Progress Notes (Signed)
Jamie, [name] ([id number]) Visit Report for 07/13/2020 HPI Details Patient Name: Date of Service: Jamie [name], [name] 07/13/2020 1:30 PM Medical Record Number: [id number] Patient Account Number: [id number] Date of Birth/Sex: Treating RN: Jun 25, 1940 (80 y.o. Jamie Silence Primary Care Provider: Hiram Gash RK, Lorenso Quarry Other Clinician: Referring Provider: Treating Provider/Extender: Diamantina Monks N-CLA RK, V A LENICA Weeks in Treatment: 1 History of Present Illness HPI Description: ADMISSION 06/30/2020 Jamie [name] is an 80 year old woman who lives in [place]. She is here with her niece for review of wounds on the left medial lower leg and ankle. These have apparently been present for over a year and she followed with Dr. Olegario Messier at the [facility] in [place] for quite a period of time although it looks as though there was a initial consult wound from Dr. Marcha Solders on February 18 presumably there was therefore hiatus. At that point the wounds were described as being there for 3 months. She also tells me she was at the wound care center in [place] for a period of time with this. 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 She had venous studies that were negative for DVT ABIs on the right were 1.01 on the left 1.06. She has . had previous applications of puraply, compression which she does not tolerate very well. She has had several rounds of oral antibiotic therapy. She complains of unrelenting pain and she is seeing Dr. Reece Agar V of pain management apparently was on oxycodone but that did not help. She also had a skin biopsy done by Dr. Olegario Messier although we do not have that result. She does not appear to have an arterial issue. I am not completely clear what she has been putting on the wounds lately. 3/31; this patient has a particularly nasty set of wounds on the left medial ankle in the middle of what looks to be  hemosiderin deposition secondary to chronic venous insufficiency. She has a lot of pain followed by pain management. Dr. Marcha Solders apparently did a biopsy of something on the left leg last fall what I would like to get this result. She has a history of MRSA treatment. I do not believe she had reflux studies but she did have DVT rule out studies. Previous ABIs have not suggested arterial insufficiency 4/7; difficult wounds on the left medial ankle probably chronic venous insufficiency. With considerable effort on behalf of our case manager we were able to finally to speak to somebody at the hospital in [place] who indicated that no biopsy of this area have been done even though the patient describes this in some detail and is even able to point out where she thinks the biopsy was done. We have been using Sorbact. The PCR culture I did showed polymicrobial identification with Pseudomonas, staph aureus, Peptostreptococcus. All of this and low titers. Resistance genes identified were MRSA, staph virulence gene and tetracycline. We are going to send this to [facility] for a topical antibiotic which is something that we have had good success with recently in large venous ulcers with a lot of purulent drainage. There would not be an easy oral alternative here possibly line escalated and ciprofloxacin if we need to use systemic antibiotic Electronic Signature(s) Signed: 07/13/2020 5:22:04 PM By: [name] MD Entered By: [name] on 07/13/2020 14:13:35 -------------------------------------------------------------------------------- Physical Exam Details Patient Name: Date of Service: Jamie [name], Jamie Ditto W. 07/13/2020 1:30 PM Medical Record Number: [id number] Patient Account  Number: [id number] Date of Birth/Sex: Treating RN: 05/19/1940 (80 y.o. Jamie [name]) Deaton, Bobbi Primary Care Provider: Hiram GashEGGLESTO N-CLA RK, Lorenso QuarryV A LENICA Other Clinician: Referring Provider: Treating Provider/Extender: Diamantina Monksobson, Daci Stubbe EGGLESTO  N-CLA RK, V A LENICA Weeks in Treatment: 1 Constitutional Patient is hypertensive.. Pulse regular and within target range for patient.Marland Kitchen. Respirations regular, non-labored and within target range.. Temperature is normal and within the target range for the patient.Marland Kitchen. Appears in no distress. Notes Wound exam; the patient has 3 open areas on the left lower leg which have coalesced and 1 posteriorly.. Each 1 of these has some degree of adherent debris surrounding hemosiderin deposition very painful. No mechanical debridement today although I did vigorously wash this with Anasept and gauze she tolerates this very poorly. There is no evidence of surrounding soft tissue infection Electronic Signature(s) Signed: 07/13/2020 5:22:04 PM By: Baltazar Najjarobson, Prabhnoor Ellenberger MD Entered By: Baltazar Najjarobson, Shonta Phillis on 07/13/2020 14:15:20 -------------------------------------------------------------------------------- Physician Orders Details Patient Name: Date of Service: Jamie BrinkWILSO N, Jamie DittoMA RGEURITE W. 07/13/2020 1:30 PM Medical Record Number: [id number] Patient Account Number: [id number] Date of Birth/Sex: Treating RN: 07/04/1940 (80 y.o. 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 - Friday Return A Bathing/ Shower/ Hygiene May shower with protection but do not get wound dressing(s) wet. Edema Control - Lymphedema / SCD / Other Elevate legs to the level of the heart or above for 30 minutes daily and/or when sitting, a frequency of: - throughout the day 3-4 times a  day. Avoid standing for long periods of time. Exercise regularly Additional Orders / Instructions Follow Nutritious Diet Home Health New wound care orders this week; continue Home Health for wound care. May utilize formulary equivalent dressing for wound treatment orders unless otherwise specified. - [facility] fax #[phone]; Wound center weekly on Fridays and home health to change on Mondays and Wednesdays. Wound Treatment Wound #3 - Lower Leg Wound Laterality: Left, Medial, Distal Cleanser: Soap and Water (Home Health) 2 x Per Week/30 Days Discharge Instructions: May shower and wash wound with dial antibacterial soap and water prior to dressing change. Cleanser: Wound Cleanser (Home Health) 2 x Per Week/30 Days Discharge Instructions: Cleanse the wound with wound cleanser prior to applying a clean dressing using gauze sponges, not tissue or cotton balls. Peri-Wound Care: Triamcinolone 15 (g) 2 x Per Week/30 Days Discharge Instructions: In clinic only. Apply to red, irritated periwound Peri-Wound Care: Sween Lotion (Moisturizing lotion) (Home Health) 2 x Per Week/30 Days Discharge Instructions: apply at home. Apply moisturizing lotion as directed Topical: Skintegrity Hydrogel 4 (oz) (Home Health) 2 x Per Week/30 Days Discharge Instructions: Apply hydrogel to wound Prim Dressing: Cutimed Sorbact Swab (Home Health) 2 x Per Week/30 Days ary Discharge Instructions: Apply to wound with Hydrogel Prim Dressing: keystone antibiotics (Home Health) 2 x Per Week/30 Days ary Discharge Instructions: patient to purchase the topical Keystone Antibiotics when the company calls. Home Health to mix and apply under the cutimed sorbact swab with dressing changes. patient to bring topical antibiotics into clinic. Secondary Dressing: Woven Gauze Sponge, Non-Sterile 4x4 in (Home Health) 2 x Per Week/30 Days Discharge Instructions: Apply over primary dressing  as directed. Secondary  Dressing: ABD Pad, 8x10 (Home Health) 2 x Per Week/30 Days Discharge Instructions: Apply over primary dressing as directed. Secondary Dressing: Zetuvit Plus 4x8 in [facility]) 2 x Per Week/30 Days Discharge Instructions: Apply over primary dressing as directed. Secondary Dressing: CarboFLEX Odor Control Dressing, 4x4 in (Home Health) 2 x Per Week/30 Days Discharge Instructions: Apply over primary dressing as directed. Compression Wrap: ThreePress (3 layer compression wrap) 2 x Per Week/30 Days Discharge Instructions: Apply three layer compression as directed. Wound #4 - Lower Leg Wound Laterality: Left, Posterior Cleanser: Soap and Water (Home Health) 2 x Per Week/30 Days Discharge Instructions: May shower and wash wound with dial antibacterial soap and water prior to dressing change. Cleanser: Wound Cleanser (Home Health) 2 x Per Week/30 Days Discharge Instructions: Cleanse the wound with wound cleanser prior to applying a clean dressing using gauze sponges, not tissue or cotton balls. Peri-Wound Care: Triamcinolone 15 (g) 2 x Per Week/30 Days Discharge Instructions: In clinic only. Apply to red, irritated periwound Peri-Wound Care: Sween Lotion (Moisturizing lotion) (Home Health) 2 x Per Week/30 Days Discharge Instructions: apply at home. Apply moisturizing lotion as directed Topical: Skintegrity Hydrogel 4 (oz) (Home Health) 2 x Per Week/30 Days Discharge Instructions: Apply hydrogel to wound Prim Dressing: Cutimed Sorbact Swab (Home Health) 2 x Per Week/30 Days ary Discharge Instructions: Apply to wound with Hydrogel Prim Dressing: keystone antibiotics (Home Health) 2 x Per Week/30 Days ary Discharge Instructions: patient to purchase the topical Keystone Antibiotics when the company calls. Home Health to mix and apply under the cutimed sorbact swab with dressing changes. patient to bring topical antibiotics into clinic. Secondary Dressing: Woven Gauze Sponge, Non-Sterile 4x4 in (Home  Health) 2 x Per Week/30 Days Discharge Instructions: Apply over primary dressing as directed. Secondary Dressing: ABD Pad, 8x10 (Home Health) 2 x Per Week/30 Days Discharge Instructions: Apply over primary dressing as directed. Secondary Dressing: Zetuvit Plus 4x8 in [facility]) 2 x Per Week/30 Days Discharge Instructions: Apply over primary dressing as directed. Secondary Dressing: CarboFLEX Odor Control Dressing, 4x4 in (Home Health) 2 x Per Week/30 Days Discharge Instructions: Apply over primary dressing as directed. Compression Wrap: ThreePress (3 layer compression wrap) 2 x Per Week/30 Days Discharge Instructions: Apply three layer compression as directed. Electronic Signature(s) Signed: 07/13/2020 5:22:04 PM By: [name] MD Signed: 07/14/2020 4:53:51 PM By: Shawn Stall Entered By: Shawn Stall on 07/13/2020 14:06:27 -------------------------------------------------------------------------------- Problem List Details Patient Name: Date of Service: Jamie [name], Jamie Ditto W. 07/13/2020 1:30 PM Medical Record Number: [id number] Patient Account Number: [id number] Date of Birth/Sex: Treating RN: [birth year] (80 y.o. 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 07/13/2020 1:30 PM Medical Record Number: [id number] Patient Account Number: [id number] Date of Birth/Sex: Treating RN: Apr 10, 1940 (80 y.o. Debara Pickett, Millard.Loa Primary Care Provider: Hiram Gash RK, Lorenso Quarry Other Clinician: Referring Provider: Treating Provider/Extender: Diamantina Monks N-CLA RK,  V A LENICA Weeks in Treatment: 1 Subjective History of Present Illness (HPI) ADMISSION 06/30/2020 Mrs. Lowdermilk is an 80 year old woman who lives in [place]. She is here with her niece for review of wounds on the left medial lower leg and ankle. These have apparently been present for over a year and she followed with Dr. Olegario Messier at the [facility] in [place] for quite a period of time although it looks as though there was a initial consult wound from Dr. Marcha Solders on February 18 presumably there was therefore hiatus. At that point the wounds were described as being there for 3 months. She also tells me she was at the wound care center in [place] for a period of time with this. 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 She had venous studies that were negative for DVT ABIs on the right were 1.01 on the left 1.06. She has . had previous applications of puraply, compression which she does not tolerate very well. She has had several rounds of oral antibiotic therapy. She complains of unrelenting pain and she is seeing Dr. Reece Agar V of pain management apparently was on oxycodone but that did not help. She also had a skin biopsy done by Dr. Olegario Messier although we do not have that result. She does not appear to have an arterial issue. I am not completely clear what she has been putting on the wounds lately. 3/31; this patient has a particularly nasty set of wounds on the left medial ankle in the middle of what looks to be hemosiderin deposition secondary to chronic venous insufficiency. She  has a lot of pain followed by pain management. Dr. Marcha Solders apparently did a biopsy of something on the left leg last fall what I would like to get this result. She has a history of MRSA treatment. I do not believe she had reflux studies but she did have DVT rule out studies. Previous ABIs have not suggested arterial insufficiency 4/7; difficult wounds on the left medial ankle probably chronic venous insufficiency. With considerable effort on behalf of our case manager we were able to finally to speak to somebody at the hospital in [place] who indicated that no biopsy of this area have been done even though the patient describes this in some detail and is even able to point out where she thinks the biopsy was done. We have been using Sorbact. The PCR culture I did showed polymicrobial identification with Pseudomonas, staph aureus, Peptostreptococcus. All of this and low titers. Resistance genes identified were MRSA, staph virulence gene and tetracycline. We are going to send this to [facility] for a topical antibiotic which is something that we have had good success with recently in large venous ulcers with a lot of purulent drainage. There would not be an easy oral alternative here possibly line escalated and ciprofloxacin if we need to use systemic antibiotic Objective Constitutional Patient is hypertensive.. Pulse regular and within target range for patient.Marland Kitchen Respirations regular, non-labored and within target range.. Temperature is normal and within the target range for the patient.Marland Kitchen Appears in no distress. Vitals Time Taken: 1:50 PM, Height: 63 in, Weight: 150 lbs, BMI: 26.6, Temperature: 98.3 F, Pulse: 80 bpm, Respiratory Rate: 18 breaths/min, Blood Pressure: 155/96 mmHg. General Notes: Wound exam; the patient has 3 open areas on the left lower leg which have coalesced and 1 posteriorly.. Each 1 of these has some degree of adherent debris surrounding hemosiderin deposition very painful. No  mechanical debridement  today although I did vigorously wash this with Anasept and gauze she tolerates this very poorly. There is no evidence of surrounding soft tissue infection Integumentary (Hair, Skin) Wound #1 status is Converted. Original cause of wound was Gradually Appeared. The date acquired was: 04/09/2019. The wound has been in treatment 1 weeks. The wound is located on the Left,Proximal,Medial Lower Leg. There is Fat Layer (Subcutaneous Tissue) exposed. There is a medium amount of purulent drainage noted. The wound margin is flat and intact. There is small (1-33%) pink granulation within the wound bed. There is a large (67-100%) amount of necrotic tissue within the wound bed including Adherent Slough. Wound #2 status is Converted. Original cause of wound was Gradually Appeared. The date acquired was: 04/09/2019. The wound has been in treatment 1 weeks. The wound is located on the Left,Medial Lower Leg. There is Fat Layer (Subcutaneous Tissue) exposed. There is a medium amount of serosanguineous drainage noted. The wound margin is flat and intact. There is medium (34-66%) red granulation within the wound bed. There is a medium (34-66%) amount of necrotic tissue within the wound bed including Adherent Slough. Wound #3 status is Open. Original cause of wound was Gradually Appeared. The date acquired was: 04/09/2019. The wound has been in treatment 1 weeks. The wound is located on the Left,Distal,Medial Lower Leg. The wound measures 9.5cm length x 2.9cm width x 0.2cm depth; 21.638cm^2 area and 4.328cm^3 volume. There is Fat Layer (Subcutaneous Tissue) exposed. There is no tunneling or undermining noted. There is a medium amount of purulent drainage noted. The wound margin is well defined and not attached to the wound base. There is medium (34-66%) red granulation within the wound bed. There is a medium (34- 66%) amount of necrotic tissue within the wound bed including Adherent Slough. General Notes:  Wounds 1 and 2 incorporated into one wound #3 Wound #4 status is Open. Original cause of wound was Gradually Appeared. The date acquired was: 04/09/2019. The wound has been in treatment 1 weeks. The wound is located on the Left,Posterior Lower Leg. The wound measures 1.7cm length x 1.5cm width x 0.2cm depth; 2.003cm^2 area and 0.401cm^3 volume. There is Fat Layer (Subcutaneous Tissue) exposed. There is no tunneling or undermining noted. There is a medium amount of purulent drainage noted. The wound margin is well defined and not attached to the wound base. There is small (1-33%) pink granulation within the wound bed. There is a large (67-100%) amount of necrotic tissue within the wound bed including Adherent Slough. 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 There was a Three Layer Compression Therapy Procedure by Zenaida Deed, RN. Post procedure Diagnosis Wound #3: Same as Pre-Procedure Wound #4 Pre-procedure diagnosis of Wound #4 is a Venous Leg Ulcer located on the Left,Posterior Lower Leg . There was a Three Layer Compression Therapy Procedure by Zenaida Deed, RN. Post procedure Diagnosis Wound #4: Same as Pre-Procedure Plan Follow-up Appointments: Return Appointment in 1 week. - Friday Bathing/ Shower/ Hygiene: May shower with protection but do not get wound dressing(s) wet. Edema Control - Lymphedema / SCD / Other: Elevate legs to the level of the heart or above for 30 minutes daily and/or when sitting, a frequency of: - throughout the day 3-4 times a day.  Avoid standing for long periods of time. Exercise regularly Additional Orders / Instructions: Follow Nutritious Diet Home  Health: New wound care orders this week; continue Home Health for wound care. May utilize formulary equivalent dressing for wound treatment orders unless otherwise specified. - [facility] fax #[phone]; Wound center weekly on Fridays and home health to change on Mondays and Wednesdays. WOUND #3: - Lower Leg Wound Laterality: Left, Medial, Distal Cleanser: Soap and Water (Home Health) 2 x Per Week/30 Days Discharge Instructions: May shower and wash wound with dial antibacterial soap and water prior to dressing change. Cleanser: Wound Cleanser (Home Health) 2 x Per Week/30 Days Discharge Instructions: Cleanse the wound with wound cleanser prior to applying a clean dressing using gauze sponges, not tissue or cotton balls. Peri-Wound Care: Triamcinolone 15 (g) 2 x Per Week/30 Days Discharge Instructions: In clinic only. Apply to red, irritated periwound Peri-Wound Care: Sween Lotion (Moisturizing lotion) (Home Health) 2 x Per Week/30 Days Discharge Instructions: apply at home. Apply moisturizing lotion as directed Topical: Skintegrity Hydrogel 4 (oz) (Home Health) 2 x Per Week/30 Days Discharge Instructions: Apply hydrogel to wound Prim Dressing: Cutimed Sorbact Swab (Home Health) 2 x Per Week/30 Days ary Discharge Instructions: Apply to wound with Hydrogel Prim Dressing: keystone antibiotics (Home Health) 2 x Per Week/30 Days ary Discharge Instructions: patient to purchase the topical Keystone Antibiotics when the company calls. Home Health to mix and apply under the cutimed sorbact swab with dressing changes. patient to bring topical antibiotics into clinic. Secondary Dressing: Woven Gauze Sponge, Non-Sterile 4x4 in (Home Health) 2 x Per Week/30 Days Discharge Instructions: Apply over primary dressing as directed. Secondary Dressing: ABD Pad, 8x10 (Home Health) 2 x Per Week/30 Days Discharge Instructions: Apply over primary dressing as directed. Secondary Dressing:  Zetuvit Plus 4x8 in [facility]) 2 x Per Week/30 Days Discharge Instructions: Apply over primary dressing as directed. Secondary Dressing: CarboFLEX Odor Control Dressing, 4x4 in (Home Health) 2 x Per Week/30 Days Discharge Instructions: Apply over primary dressing as directed. Com pression Wrap: ThreePress (3 layer compression wrap) 2 x Per Week/30 Days Discharge Instructions: Apply three layer compression as directed. WOUND #4: - Lower Leg Wound Laterality: Left, Posterior Cleanser: Soap and Water (Home Health) 2 x Per Week/30 Days Discharge Instructions: May shower and wash wound with dial antibacterial soap and water prior to dressing change. Cleanser: Wound Cleanser (Home Health) 2 x Per Week/30 Days Discharge Instructions: Cleanse the wound with wound cleanser prior to applying a clean dressing using gauze sponges, not tissue or cotton balls. Peri-Wound Care: Triamcinolone 15 (g) 2 x Per Week/30 Days Discharge Instructions: In clinic only. Apply to red, irritated periwound Peri-Wound Care: Sween Lotion (Moisturizing lotion) (Home Health) 2 x Per Week/30 Days Discharge Instructions: apply at home. Apply moisturizing lotion as directed Topical: Skintegrity Hydrogel 4 (oz) (Home Health) 2 x Per Week/30 Days Discharge Instructions: Apply hydrogel to wound Prim Dressing: Cutimed Sorbact Swab (Home Health) 2 x Per Week/30 Days ary Discharge Instructions: Apply to wound with Hydrogel Prim Dressing: keystone antibiotics (Home Health) 2 x Per Week/30 Days ary Discharge Instructions: patient to purchase the topical Keystone Antibiotics when the company calls. Home Health to mix and apply under the cutimed sorbact swab with dressing changes. patient to bring topical antibiotics into clinic. Secondary Dressing: Woven Gauze Sponge, Non-Sterile 4x4 in (Home Health) 2 x Per Week/30 Days Discharge Instructions: Apply over primary dressing as directed. Secondary Dressing: ABD Pad, 8x10 (Home Health)  2 x Per Week/30  Days Discharge Instructions: Apply over primary dressing as directed. Secondary Dressing: Zetuvit Plus 4x8 in [facility]) 2 x Per Week/30 Days Discharge Instructions: Apply over primary dressing as directed. Secondary Dressing: CarboFLEX Odor Control Dressing, 4x4 in (Home Health) 2 x Per Week/30 Days Discharge Instructions: Apply over primary dressing as directed. Com pression Wrap: ThreePress (3 layer compression wrap) 2 x Per Week/30 Days Discharge Instructions: Apply three layer compression as directed. 1. I am going to forward the results of the surface deep tissue culture for a topical combination antibiotic 2. At this point I do not think she needs systemic therapy 3. Still on Sorbact under compression 4. I think she is going to require venous reflux studies I think she has had DVT rule out studies but I think this is certainly something were going to need to look at. 5. The patient lives in [place], I do not know if the reflux studies can be done there or not. Electronic Signature(s) Signed: 07/13/2020 5:22:04 PM By: [name] MD Entered By: [name] on 07/13/2020 14:17:03 -------------------------------------------------------------------------------- SuperBill Details Patient Name: Date of Service: Jamie [name], Jamie Ditto W. 07/13/2020 Medical Record Number: [id number] Patient Account Number: [id number] Date of Birth/Sex: Treating RN: 01/12/1941 (80 y.o. 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 Facility Procedures CPT4 Code: [id number] Description:  (Facility Use Only) [phone] - APPLY MULTLAY COMPRS LWR LT LEG Modifier: Quantity: 1 Physician Procedures : CPT4 Code Description Modifier [id number] - WC PHYS LEVEL 4 - EST PT ICD-10 Diagnosis Description L97.922 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

## 2020-07-21 ENCOUNTER — Encounter (HOSPITAL_BASED_OUTPATIENT_CLINIC_OR_DEPARTMENT_OTHER): Payer: Medicare Other | Admitting: Internal Medicine

## 2020-07-21 ENCOUNTER — Other Ambulatory Visit: Payer: Self-pay

## 2020-07-21 DIAGNOSIS — I87332 Chronic venous hypertension (idiopathic) with ulcer and inflammation of left lower extremity: Secondary | ICD-10-CM | POA: Diagnosis not present

## 2020-07-21 NOTE — Progress Notes (Signed)
Jamie [name], Jamie [name] ([id number]) Visit Report for 07/21/2020 Debridement Details Patient Name: Date of Service: Jamie [name], [name] 07/21/2020 12:45 PM Medical Record Number: [id number] Patient Account Number: [id number] Date of Birth/Sex: Treating RN: 13-Oct-1940 (80 y.o. Jamie [name] Primary Care Provider: Hiram Gash [name], Jamie [name] Other Clinician: Referring Provider: Treating Provider/Extender: Jamie [name], Jamie [name] Weeks in Treatment: 3 Debridement Performed for Assessment: Wound #3 Left,Distal,Medial Lower Leg Performed By: Physician Jamie Caul., MD Debridement Type: Debridement Severity of Tissue Pre Debridement: Fat layer exposed Level of Consciousness (Pre-procedure): Awake and Alert Pre-procedure Verification/Time Out Yes - 13:11 Taken: Start Time: 13:12 Pain Control: Other : Benzocaine T Area Debrided (L x W): otal 5 (cm) x 4 (cm) = 20 (cm) Tissue and other material debrided: Non-Viable, Slough, Subcutaneous, Slough Level: Skin/Subcutaneous Tissue Debridement Description: Excisional Instrument: Curette Bleeding: Minimum Hemostasis Achieved: Pressure End Time: 13:20 Response to Treatment: Procedure was tolerated well Level of Consciousness (Post- Awake and Alert procedure): Post Debridement Measurements of Total Wound Length: (cm) 9.5 Width: (cm) 4.5 Depth: (cm) 0.2 Volume: (cm) 6.715 Character of Wound/Ulcer Post Debridement: Stable Severity of Tissue Post Debridement: Fat layer exposed Post Procedure Diagnosis Same as Pre-procedure Electronic Signature(s) Signed: 07/21/2020 4:57:27 PM By: Jamie Najjar MD Signed: 07/21/2020 5:21:53 PM By: Jamie [name] Entered By: Jamie [name] on 07/21/2020 13:24:02 -------------------------------------------------------------------------------- HPI Details Patient Name: Date of Service: Jamie Brink, MA Jamie W. 07/21/2020 12:45 PM Medical Record Number: [id number] Patient  Account Number: [id number] Date of Birth/Sex: Treating RN: 10/16/1940 (80 y.o. Jamie [name] Primary Care Provider: Hiram Gash [name], Jamie [name] Other Clinician: Referring Provider: Treating Provider/Extender: Jamie [name], Jamie [name] Weeks in Treatment: 3 History of Present Illness HPI Description: ADMISSION 06/30/2020 [name] is an 80 year old woman who lives in [place]. She is here with her niece for review of wounds on the left medial lower leg and ankle. These have apparently been present for over a year and she followed with Dr. Olegario [name] at the [facility] in [place] for quite a period of time although it looks as though there was a initial consult wound from Dr. Marcha [name] on February 18 presumably there was therefore hiatus. At that point the wounds were described as being there for 3 months. She also tells me she was at the wound care center in [place] for a period of time with this. 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 She had venous studies that were negative for DVT ABIs on the right were 1.01 on the left 1.06. She has . had previous applications of puraply, compression which she does not tolerate very well. She has had several rounds of oral antibiotic therapy. She complains of unrelenting pain and she is seeing Dr. Reece Agar Jamie of pain management apparently was on oxycodone but that did not help. She also had a skin biopsy done by Dr. Olegario [name] although we do not have that result. She does not appear to have an arterial issue. I am not completely clear what she has been putting on the wounds lately. 3/31; this patient has a particularly nasty set of wounds on the left medial ankle in the middle of what looks to be hemosiderin deposition secondary to chronic venous insufficiency. She has a lot of pain followed by pain management. Dr. Marcha [name] apparently did a biopsy of something on the left leg last fall what  I would like to get  this result. She has a history of MRSA treatment. I do not believe she had reflux studies but she did have DVT rule out studies. Previous ABIs have not suggested arterial insufficiency 4/7; difficult wounds on the left medial ankle probably chronic venous insufficiency. With considerable effort on behalf of our case manager we were able to finally to speak to somebody at the hospital in [place] who indicated that no biopsy of this area have been done even though the patient describes this in some detail and is even able to point out where she thinks the biopsy was done. We have been using Sorbact. The PCR culture I did showed polymicrobial identification with Pseudomonas, staph aureus, Peptostreptococcus. All of this and low titers. Resistance genes identified were MRSA, staph virulence gene and tetracycline. [facility] which is something that we have had good success with recently in large venous ulcers with a lot of purulent drainage. There would not be an easy oral alternative here possibly line escalated and ciprofloxacin if we need to use systemic antibiotic 4/15; difficult area on the left medial ankle. Most likely chronic venous insufficiency. I think she will probably need venous reflux study I think she had DVT rule outs but not venous reflux studies. We have not yet obtained the topical antibiotics. She has home health changing the dressing we have been using Sorbact for adherent fibrinous debris on the surface. Very difficult to remove Electronic Signature(s) Signed: 07/21/2020 4:57:27 PM By: Jamie Najjarobson, Antonio Woodhams MD Entered By: Jamie Najjarobson, Stevana Dufner on 07/21/2020 13:23:15 -------------------------------------------------------------------------------- Physical Exam Details Patient Name: Date of Service: Jamie BrinkWILSO N, Cheree DittoMA Jamie W. 07/21/2020 12:45 PM Medical Record Number: [id number] Patient Account Number: [id number] Date of  Birth/Sex: Treating RN: 06/05/1940 (80 y.o. Jamie CluckF) Barnhart, Jodi Primary Care Provider: Hiram GashEGGLESTO N-CLA [name], Jamie QuarryV A [name] Other Clinician: Referring Provider: Treating Provider/Extender: Jamie Monksobson, Ishanvi Mcquitty EGGLESTO N-CLA [name], Jamie [name] Weeks in Treatment: 3 Constitutional Sitting or standing Blood Pressure is within target range for patient.. Pulse regular and within target range for patient.Marland Kitchen. Respirations regular, non-labored and within target range.. Temperature is normal and within the target range for the patient.Marland Kitchen. Appears in no distress. Notes Wound exam; the patient has initially 3 open areas on the left lower leg which have coalesced and 1 more posteriorly at the level of the Achilles. All of these in roughly the same condition. The top 50% of this very adherent difficult to remove necrotic tissue on the surface of the wound I used a #5 curette she does not tolerate this very well in spite of lidocaine and benzocaine. Ultimately I may need to try injectable lidocaine. There is no clear evidence of infection marked inflammation however compatible with chronic venous insufficiency and stasis dermatitis. Her edema control is reasonable Electronic Signature(s) Signed: 07/21/2020 4:57:27 PM By: Jamie Najjarobson, Una Yeomans MD Entered By: Jamie Najjarobson, Andy Allende on 07/21/2020 13:25:03 -------------------------------------------------------------------------------- Physician Orders Details Patient Name: Date of Service: Jamie BrinkWILSO N, Cheree DittoMA Jamie W. 07/21/2020 12:45 PM Medical Record Number: [id number] Patient Account Number: [id number] Date of Birth/Sex: Treating RN: 04/29/1940 (80 y.o. 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 - Friday Return A Bathing/ Shower/ Hygiene May shower with protection but do not get wound dressing(s) wet. Edema Control - Lymphedema / SCD / Other Elevate legs to the level of the heart or above for 30 minutes daily and/or when sitting, a frequency of: - throughout the day 3-4 times a day. Avoid standing for long periods of time. Exercise regularly Additional Orders / Instructions Follow Nutritious Diet Home Health Other Home Health Orders/Instructions: - [facility] fax #[phone]; Wound center weekly on Fridays and home health to change on Mondays and Wednesdays. Apply Hydrogel only, not Hydrogel gauze/film. Wound Treatment Wound #3 - Lower Leg Wound Laterality: Left, Medial, Distal Cleanser: Soap and Water (Home Health) 2 x Per Week/30 Days Discharge Instructions: May shower and wash wound with dial antibacterial soap and water prior to dressing change. Cleanser: Wound Cleanser (Home Health) 2 x Per Week/30 Days Discharge Instructions: Cleanse the wound with wound cleanser prior to applying a clean dressing using gauze sponges, not tissue or cotton balls. Peri-Wound Care: Triamcinolone 15 (g) 2 x Per Week/30 Days Discharge Instructions: In clinic only. Apply to red, irritated periwound Peri-Wound Care: Sween Lotion (Moisturizing lotion) (Home Health) 2 x Per Week/30 Days Discharge Instructions: apply at home. Apply moisturizing lotion as directed Topical: Skintegrity Hydrogel 4 (oz) (Home Health) 2 x Per Week/30 Days Discharge Instructions: Apply hydrogel over Sorbact Topical: [facility] Antibiotic 2 x Per Week/30 Days Discharge Instructions: Apply per instructions-In office only Prim Dressing: Cutimed Sorbact Swab (Home Health) 2 x Per Week/30 Days ary Discharge Instructions: Apply  to wound Prim Dressing: [facility] antibiotics (Home Health) 2 x Per Week/30 Days ary Discharge Instructions: patient to purchase the topical [facility] Antibiotics when the company calls. Home Health to mix and apply under the cutimed sorbact swab with dressing changes. patient to bring topical antibiotics into clinic. Secondary Dressing: Woven Gauze Sponge, Non-Sterile 4x4 in (Home Health) 2 x Per Week/30 Days Discharge Instructions: Apply over primary dressing as directed. Secondary Dressing: ABD Pad, 8x10 (Home Health) 2 x Per Week/30 Days Discharge Instructions: Apply over primary dressing as directed. Secondary Dressing: Zetuvit Plus 4x8 in [facility]) 2 x Per Week/30 Days Discharge Instructions: Apply over primary dressing as directed. Secondary Dressing: CarboFLEX Odor Control Dressing, 4x4 in (Home Health) 2 x Per Week/30 Days Discharge Instructions: Apply over primary dressing in office only Compression Wrap: ThreePress (3 layer compression wrap) 2 x Per Week/30 Days Discharge Instructions: Apply three layer compression as directed. Wound #4 - Lower Leg Wound Laterality: Left, Posterior Cleanser: Soap and Water (Home Health) 2 x Per Week/30 Days Discharge Instructions: May shower and wash wound with dial antibacterial soap and water prior to dressing change. Cleanser: Wound Cleanser (Home Health) 2 x Per Week/30 Days Discharge Instructions: Cleanse the wound with wound cleanser prior to applying a clean dressing using gauze sponges, not tissue or cotton balls. Peri-Wound Care: Triamcinolone 15 (g) 2 x Per Week/30 Days Discharge Instructions: In clinic only. Apply to red, irritated periwound Peri-Wound Care: Sween Lotion (Moisturizing lotion) (Home Health) 2 x Per Week/30 Days Discharge Instructions: apply at home. Apply moisturizing lotion as directed Topical: Skintegrity Hydrogel 4 (oz) (Home Health) 2 x Per Week/30 Days Discharge Instructions: Apply hydrogel over Sorbact Prim  Dressing: Cutimed Sorbact Swab (Home Health) 2 x Per Week/30 Days ary Discharge Instructions: Apply to wound Prim Dressing: [facility] antibiotics (Home Health) 2 x Per Week/30 Days ary Discharge Instructions: patient to purchase the topical [facility] Antibiotics when  the company calls. Home Health to mix and apply under the cutimed sorbact swab with dressing changes. patient to bring topical antibiotics into clinic. Secondary Dressing: Woven Gauze Sponge, Non-Sterile 4x4 in (Home Health) 2 x Per Week/30 Days Discharge Instructions: Apply over primary dressing as directed. Secondary Dressing: ABD Pad, 8x10 (Home Health) 2 x Per Week/30 Days Discharge Instructions: Apply over primary dressing as directed. Secondary Dressing: Zetuvit Plus 4x8 in [facility]) 2 x Per Week/30 Days Discharge Instructions: Apply over primary dressing as directed. Secondary Dressing: CarboFLEX Odor Control Dressing, 4x4 in (Home Health) 2 x Per Week/30 Days Discharge Instructions: Apply over primary dressing in office only Compression Wrap: ThreePress (3 layer compression wrap) 2 x Per Week/30 Days Discharge Instructions: Apply three layer compression as directed. Electronic Signature(s) Signed: 07/21/2020 4:57:27 PM By: Jamie Najjar MD Signed: 07/21/2020 5:21:53 PM By: Jamie [name] Previous Signature: 07/21/2020 1:02:28 PM Version By: Jamie [name] Entered By: Jamie [name] on 07/21/2020 13:22:22 -------------------------------------------------------------------------------- Problem List Details Patient Name: Date of Service: Jamie [name], [name] 07/21/2020 12:45 PM Medical Record Number: [id number] Patient Account Number: [id number] Date of Birth/Sex: Treating RN: [birth year] (80 y.o. 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 07/21/2020 12:45 PM Medical Record Number: [id number] Patient Account Number: [id number] Date of Birth/Sex: Treating RN: 1941-01-12 (80 y.o. Jamie [name] Primary Care Provider: Hiram Gash [name], Jamie [name] Other Clinician: Referring Provider: Treating Provider/Extender: Jamie [name], Jamie [name] Weeks in Treatment: 3 Subjective History of Present Illness (HPI) ADMISSION 06/30/2020 Mrs. Beighley is an 80 year old woman who lives in [place]. She is here with her niece for review of wounds on the left medial lower leg and ankle. These have apparently been present for over a year and she followed with Dr. Olegario [name] at the [facility] in [place] for quite a period of time although it looks as though there was a initial consult wound from Dr. Marcha [name] on February 18 presumably there was therefore hiatus. At that point the wounds were described as being there for 3 months. She also tells me she was at the wound care center in [place] for a period of time with this. 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 She had venous studies that were negative for DVT ABIs on the right were 1.01 on the left 1.06. She has . had previous applications of  puraply, compression which she does not tolerate very well. She has had several rounds of oral antibiotic therapy. She complains of unrelenting pain and she is seeing Dr. Reece Agar Jamie of pain management apparently was on oxycodone but that did not help. She also had a skin biopsy done by Dr. Olegario [name] although we do not have that result. She does not appear to have an arterial issue. I am not completely clear what  she has been putting on the wounds lately. 3/31; this patient has a particularly nasty set of wounds on the left medial ankle in the middle of what looks to be hemosiderin deposition secondary to chronic venous insufficiency. She has a lot of pain followed by pain management. Dr. Marcha [name] apparently did a biopsy of something on the left leg last fall what I would like to get this result. She has a history of MRSA treatment. I do not believe she had reflux studies but she did have DVT rule out studies. Previous ABIs have not suggested arterial insufficiency 4/7; difficult wounds on the left medial ankle probably chronic venous insufficiency. With considerable effort on behalf of our case manager we were able to finally to speak to somebody at the hospital in [place] who indicated that no biopsy of this area have been done even though the patient describes this in some detail and is even able to point out where she thinks the biopsy was done. We have been using Sorbact. The PCR culture I did showed polymicrobial identification with Pseudomonas, staph aureus, Peptostreptococcus. All of this and low titers. Resistance genes identified were MRSA, staph virulence gene and tetracycline. We are going to send this to [facility] for a topical antibiotic which is something that we have had good success with recently in large venous ulcers with a lot of purulent drainage. There would not be an easy oral alternative here possibly line escalated and ciprofloxacin if we need to use systemic antibiotic 4/15; difficult area  on the left medial ankle. Most likely chronic venous insufficiency. I think she will probably need venous reflux study I think she had DVT rule outs but not venous reflux studies. We have not yet obtained the topical antibiotics. She has home health changing the dressing we have been using Sorbact for adherent fibrinous debris on the surface. Very difficult to remove Objective Constitutional Sitting or standing Blood Pressure is within target range for patient.. Pulse regular and within target range for patient.Marland Kitchen Respirations regular, non-labored and within target range.. Temperature is normal and within the target range for the patient.Marland Kitchen Appears in no distress. Vitals Time Taken: 12:51 PM, Height: 63 in, Weight: 150 lbs, BMI: 26.6, Temperature: 98.5 F, Pulse: 90 bpm, Respiratory Rate: 16 breaths/min, Blood Pressure: 124/86 mmHg. General Notes: Wound exam; the patient has initially 3 open areas on the left lower leg which have coalesced and 1 more posteriorly at the level of the Achilles. All of these in roughly the same condition. The top 50% of this very adherent difficult to remove necrotic tissue on the surface of the wound I used a #5 curette she does not tolerate this very well in spite of lidocaine and benzocaine. Ultimately I may need to try injectable lidocaine. There is no clear evidence of infection marked inflammation however compatible with chronic venous insufficiency and stasis dermatitis. Her edema control is reasonable Integumentary (Hair, Skin) Wound #3 status is Open. Original cause of wound was Gradually Appeared. The date acquired was: 04/09/2019. The wound has been in treatment 3 weeks. The wound is located on the Left,Distal,Medial Lower Leg. The wound measures 9.5cm length x 4.5cm width x 0.2cm depth; 33.576cm^2 area and 6.715cm^3 volume. There is Fat Layer (Subcutaneous Tissue) exposed. There is no tunneling or undermining noted. There is a medium amount of purulent drainage  noted. Foul odor after cleansing was noted. The wound margin is well defined and not attached to the wound base. There is medium (34-66%) red granulation within  the wound bed. There is a medium (34-66%) amount of necrotic tissue within the wound bed including Adherent Slough. Wound #4 status is Open. Original cause of wound was Gradually Appeared. The date acquired was: 04/09/2019. The wound has been in treatment 3 weeks. The wound is located on the Left,Posterior Lower Leg. The wound measures 2.4cm length x 2.2cm width x 0.2cm depth; 4.147cm^2 area and 0.829cm^3 volume. There is Fat Layer (Subcutaneous Tissue) exposed. There is no tunneling or undermining noted. There is a medium amount of purulent drainage noted. Foul odor after cleansing was noted. The wound margin is well defined and not attached to the wound base. There is medium (34-66%) pink granulation within the wound bed. There is a medium (34-66%) amount of necrotic tissue within the wound bed including Adherent Slough. 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 There was a Excisional Skin/Subcutaneous Tissue Debridement with a total area of 20 sq cm performed by Jamie Caul., MD. With the following instrument(s): Curette to remove Non-Viable tissue/material. Material removed includes Subcutaneous Tissue and Slough and after achieving pain control using Other (Benzocaine). No specimens were taken. A time out was conducted at 13:11, prior to the start of the procedure. A Minimum amount of bleeding was controlled with Pressure. The procedure was tolerated well. Post Debridement  Measurements: 9.5cm length x 4.5cm width x 0.2cm depth; 6.715cm^3 volume. Character of Wound/Ulcer Post Debridement is stable. Severity of Tissue Post Debridement is: Fat layer exposed. Post procedure Diagnosis Wound #3: Same as Pre-Procedure Pre-procedure diagnosis of Wound #3 is a Venous Leg Ulcer located on the Left,Distal,Medial Lower Leg . There was a Three Layer Compression Therapy Procedure by Jamie Iba, RN. Post procedure Diagnosis Wound #3: Same as Pre-Procedure Wound #4 Pre-procedure diagnosis of Wound #4 is a Venous Leg Ulcer located on the Left,Posterior Lower Leg . There was a Three Layer Compression Therapy Procedure by Jamie Iba, RN. Post procedure Diagnosis Wound #4: Same as Pre-Procedure Plan Follow-up Appointments: Return Appointment in 1 week. - Friday Bathing/ Shower/ Hygiene: May shower with protection but do not get wound dressing(s) wet. Edema Control - Lymphedema / SCD / Other: Elevate legs to the level of the heart or above for 30 minutes daily and/or when sitting, a frequency of: - throughout the day 3-4 times a day. Avoid standing for long periods of time. Exercise regularly Additional Orders / Instructions: Follow Nutritious Diet Home Health: Other Home Health Orders/Instructions: - [facility] fax #[phone]; Wound center weekly on Fridays and home health to change on Mondays and Wednesdays. Apply Hydrogel only, not Hydrogel gauze/film. WOUND #3: - Lower Leg Wound Laterality: Left, Medial, Distal Cleanser: Soap and Water (Home Health) 2 x Per Week/30 Days Discharge Instructions: May shower and wash wound with dial antibacterial soap and water prior to dressing change. Cleanser: Wound Cleanser (Home Health) 2 x Per Week/30 Days Discharge Instructions: Cleanse the wound with wound cleanser prior to applying a clean dressing using gauze sponges, not tissue or cotton balls. Peri-Wound Care: Triamcinolone 15 (g) 2 x Per Week/30  Days Discharge Instructions: In clinic only. Apply to red, irritated periwound Peri-Wound Care: Sween Lotion (Moisturizing lotion) (Home Health) 2 x Per Week/30 Days Discharge Instructions:  apply at home. Apply moisturizing lotion as directed Topical: Skintegrity Hydrogel 4 (oz) (Home Health) 2 x Per Week/30 Days Discharge Instructions: Apply hydrogel over Sorbact Topical: [facility] Antibiotic 2 x Per Week/30 Days Discharge Instructions: Apply per instructions-In office only Prim Dressing: Cutimed Sorbact Swab (Home Health) 2 x Per Week/30 Days ary Discharge Instructions: Apply to wound Prim Dressing: keystone antibiotics (Home Health) 2 x Per Week/30 Days ary Discharge Instructions: patient to purchase the topical Keystone Antibiotics when the company calls. Home Health to mix and apply under the cutimed sorbact swab with dressing changes. patient to bring topical antibiotics into clinic. Secondary Dressing: Woven Gauze Sponge, Non-Sterile 4x4 in (Home Health) 2 x Per Week/30 Days Discharge Instructions: Apply over primary dressing as directed. Secondary Dressing: ABD Pad, 8x10 (Home Health) 2 x Per Week/30 Days Discharge Instructions: Apply over primary dressing as directed. Secondary Dressing: Zetuvit Plus 4x8 in [facility]) 2 x Per Week/30 Days Discharge Instructions: Apply over primary dressing as directed. Secondary Dressing: CarboFLEX Odor Control Dressing, 4x4 in (Home Health) 2 x Per Week/30 Days Discharge Instructions: Apply over primary dressing in office only Com pression Wrap: ThreePress (3 layer compression wrap) 2 x Per Week/30 Days Discharge Instructions: Apply three layer compression as directed. WOUND #4: - Lower Leg Wound Laterality: Left, Posterior Cleanser: Soap and Water (Home Health) 2 x Per Week/30 Days Discharge Instructions: May shower and wash wound with dial antibacterial soap and water prior to dressing change. Cleanser: Wound Cleanser (Home Health) 2 x Per  Week/30 Days Discharge Instructions: Cleanse the wound with wound cleanser prior to applying a clean dressing using gauze sponges, not tissue or cotton balls. Peri-Wound Care: Triamcinolone 15 (g) 2 x Per Week/30 Days Discharge Instructions: In clinic only. Apply to red, irritated periwound Peri-Wound Care: Sween Lotion (Moisturizing lotion) (Home Health) 2 x Per Week/30 Days Discharge Instructions: apply at home. Apply moisturizing lotion as directed Topical: Skintegrity Hydrogel 4 (oz) (Home Health) 2 x Per Week/30 Days Discharge Instructions: Apply hydrogel over Sorbact Prim Dressing: Cutimed Sorbact Swab (Home Health) 2 x Per Week/30 Days ary Discharge Instructions: Apply to wound Prim Dressing: keystone antibiotics (Home Health) 2 x Per Week/30 Days ary Discharge Instructions: patient to purchase the topical Keystone Antibiotics when the company calls. Home Health to mix and apply under the cutimed sorbact swab with dressing changes. patient to bring topical antibiotics into clinic. Secondary Dressing: Woven Gauze Sponge, Non-Sterile 4x4 in (Home Health) 2 x Per Week/30 Days Discharge Instructions: Apply over primary dressing as directed. Secondary Dressing: ABD Pad, 8x10 (Home Health) 2 x Per Week/30 Days Discharge Instructions: Apply over primary dressing as directed. Secondary Dressing: Zetuvit Plus 4x8 in [facility]) 2 x Per Week/30 Days Discharge Instructions: Apply over primary dressing as directed. Secondary Dressing: CarboFLEX Odor Control Dressing, 4x4 in (Home Health) 2 x Per Week/30 Days Discharge Instructions: Apply over primary dressing in office only Com pression Wrap: ThreePress (3 layer compression wrap) 2 x Per Week/30 Days Discharge Instructions: Apply three layer compression as directed. 1. Still the Sorbact is a tooth that ABDs under 3 layer compression 2. Apparently the Sorbact was not put on properly and neither was her 3 layer compression 3. Have asked him to  bring the compounded antibiotic next week I am just not confident enough about home health to allow them to start this although they will use it after that. 4. I see no evidence of systemic infection and no need for systemic antibiotics. Electronic Signature(s) Signed: 07/21/2020 4:57:27  PM By: Jamie Najjar MD Entered By: Jamie [name] on 07/21/2020 13:26:23 -------------------------------------------------------------------------------- SuperBill Details Patient Name: Date of Service: Jamie Brink, MA Jamie W. 07/21/2020 Medical Record Number: [id number] Patient Account Number: [id number] Date of Birth/Sex: Treating RN: December 19, 1940 (80 y.o. 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

## 2020-07-25 NOTE — Progress Notes (Signed)
Jamie [name], Jamie W. ([id number]) Visit Report for 07/21/2020 Arrival Information Details Patient Name: Date of Service: Jamie [name], Jamie RGEURITE W. 07/21/2020 12:45 PM Medical Record Number: [id number] Patient Account Number: [id number] Date of Birth/Sex: Treating RN: 11/05/1940 (80 y.o. Wynelle LinkF) Lynch, Shatara Primary Care Shields Pautz: Hiram GashEGGLESTO [name]-CLA RK, Lorenso QuarryV A LENICA Other Clinician: Referring Allee Busk: Treating Layali Freund/Extender: Diamantina Monksobson, Michael EGGLESTO [name]-CLA RK, V A LENICA Weeks in Treatment: 3 Visit Information History Since Last Visit Added or deleted any medications: No Patient Arrived: Wheel Chair Any new allergies or adverse reactions: No Arrival Time: 12:49 Had a fall or experienced change in No Accompanied By: niece activities of daily living that may affect Transfer Assistance: None risk of falls: Patient Identification Verified: Yes Signs or symptoms of abuse/neglect since last visito No Secondary Verification Process Completed: Yes Hospitalized since last visit: No Patient Requires Transmission-Based Precautions: No Implantable device outside of the clinic excluding No Patient Has Alerts: Yes cellular tissue based products placed in the center Patient Alerts: L ABI: 1.06; R ABI: 1.01 since last visit: Has Dressing in Place as Prescribed: Yes Has Compression in Place as Prescribed: Yes Pain Present Now: Yes Electronic Signature(s) Signed: 07/24/2020 5:39:31 PM By: Zandra AbtsLynch, Shatara RN, BSN Entered By: Zandra AbtsLynch, Shatara on 07/21/2020 12:51:39 -------------------------------------------------------------------------------- Compression Therapy Details Patient Name: Date of Service: Jamie [name], Jamie DittoMA RGEURITE W. 07/21/2020 12:45 PM Medical Record Number: [id number] Patient Account Number: [id number] Date of Birth/Sex: Treating RN: 08/24/1940 (80 y.o. Roel CluckF) Barnhart, Jodi Primary Care Cathye Kreiter: Hiram GashEGGLESTO [name]-CLA RK, Lorenso QuarryV A LENICA Other Clinician: Referring Yoshie Kosel: Treating Johncarlos Holtsclaw/Extender: Diamantina Monksobson,  Michael EGGLESTO [name]-CLA RK, V A LENICA Weeks in Treatment: 3 Compression Therapy Performed for Wound Assessment: Wound #3 Left,Distal,Medial Lower Leg Performed By: Clinician Antonieta IbaBarnhart, Jodi, RN Compression Type: Three Layer Post Procedure Diagnosis Same as Pre-procedure Electronic Signature(s) Signed: 07/21/2020 5:21:53 PM By: Antonieta IbaBarnhart, Jodi Entered By: Antonieta IbaBarnhart, Jodi on 07/21/2020 13:14:28 -------------------------------------------------------------------------------- Compression Therapy Details Patient Name: Date of Service: Jamie [name], Jamie DittoMA RGEURITE W. 07/21/2020 12:45 PM Medical Record Number: [id number] Patient Account Number: [id number] Date of Birth/Sex: Treating RN: 10/20/1940 (80 y.o. Roel CluckF) Barnhart, Jodi Primary Care Nesta Scaturro: Hiram GashEGGLESTO [name]-CLA RK, Lorenso QuarryV A LENICA Other Clinician: Referring Jennamarie Goings: Treating Rylan Bernard/Extender: Diamantina Monksobson, Michael EGGLESTO [name]-CLA RK, V A LENICA Weeks in Treatment: 3 Compression Therapy Performed for Wound Assessment: Wound #4 Left,Posterior Lower Leg Performed By: Clinician Antonieta IbaBarnhart, Jodi, RN Compression Type: Three Layer Post Procedure Diagnosis Same as Pre-procedure Electronic Signature(s) Signed: 07/21/2020 5:21:53 PM By: Antonieta IbaBarnhart, Jodi Entered By: Antonieta IbaBarnhart, Jodi on 07/21/2020 13:14:28 -------------------------------------------------------------------------------- Lower Extremity Assessment Details Patient Name: Date of Service: Jamie [name], Jamie VirginiaMA RGEURITE W. 07/21/2020 12:45 PM Medical Record Number: [id number] Patient Account Number: [id number] Date of Birth/Sex: Treating RN: 11/05/1940 (80 y.o. Wynelle LinkF) Lynch, Shatara Primary Care Mikiala Fugett: Hiram GashEGGLESTO [name]-CLA RK, Lorenso QuarryV A LENICA Other Clinician: Referring Caidence Higashi: Treating Constantin Hillery/Extender: Diamantina Monksobson, Michael EGGLESTO [name]-CLA RK, V A LENICA Weeks in Treatment: 3 Edema Assessment Assessed: [Left: No] [Right: No] Edema: [Left: Ye] [Right: s] Calf Left: Right: Point of Measurement: 29 cm From Medial Instep 34.5  cm Ankle Left: Right: Point of Measurement: 11 cm From Medial Instep 25 cm Vascular Assessment Pulses: Dorsalis Pedis Palpable: [Left:Yes] Electronic Signature(s) Signed: 07/24/2020 5:39:31 PM By: Zandra AbtsLynch, Shatara RN, BSN Entered By: Zandra AbtsLynch, Shatara on 07/21/2020 13:06:02 -------------------------------------------------------------------------------- Multi Wound Chart Details Patient Name: Date of Service: Jamie [name], Jamie DittoMA RGEURITE W. 07/21/2020 12:45 PM Medical Record Number: [id number] Patient Account Number: [id number] Date of Birth/Sex: Treating RN: 07/26/1940 (80 y.o. Roel CluckF) Barnhart, Jodi Primary Care Jashawn Floyd: Vida RollerEGGLESTO [name]-CLA RK, V  A LENICA Other Clinician: Referring Demarie Uhlig: Treating Trevionne Advani/Extender: Diamantina Monks [name]-CLA RK, V A LENICA Weeks in Treatment: 3 Vital Signs Height(in): 63 Pulse(bpm): 90 Weight(lbs): 150 Blood Pressure(mmHg): 124/86 Body Mass Index(BMI): 27 Temperature(F): 98.5 Respiratory Rate(breaths/min): 16 Photos: [3:No Photos Left, Distal, Medial Lower Leg] [4:No Photos Left, Posterior Lower Leg] [[name]:[name] [name]] Wound Location: [3:Gradually Appeared] [4:Gradually Appeared] [[name]:[name]] Wounding Event: [3:Venous Leg Ulcer] [4:Venous Leg Ulcer] [[name]:[name]] Primary Etiology: [3:Sleep Apnea, Hypertension, Peripheral] [4:Sleep Apnea, Hypertension, Peripheral] [[name]:[name]] Comorbid History: [3:Venous Disease, Osteoarthritis, Neuropathy 04/09/2019] [4:Venous Disease, Osteoarthritis, Neuropathy 04/09/2019] [[name]:[name]] Date Acquired: [3:3] [4:3] [[name]:[name]] Weeks of Treatment: [3:Open] [4:Open] [[name]:[name]] Wound Status: [3:9.5x4.5x0.2] [4:2.4x2.2x0.2] [[name]:[name]] Measurements L x W x D (cm) [3:33.576] [4:4.147] [[name]:[name]] A (cm) : rea [3:6.715] [4:0.829] [[name]:[name]] Volume (cm) : [3:-374.00%] [4:-428.30%] [[name]:[name]] % Reduction in Area: [3:-373.90%] [4:-428.00%] [[name]:[name]] % Reduction in Volume: [3:Full Thickness Without Exposed] [4:Full Thickness Without Exposed]  [[name]:[name]] Classification: [3:Support Structures Medium] [4:Support Structures Medium] [[name]:[name]] Exudate Amount: [3:Purulent] [4:Purulent] [[name]:[name]] Exudate Type: [3:yellow, brown, green] [4:yellow, brown, green] [[name]:[name]] Exudate Color: [3:Yes] [4:Yes] [[name]:[name]] Foul Odor A Cleansing: [3:fter No] [4:No] [[name]:[name]] Odor Anticipated Due to Product Use: [3:Well defined, not attached] [4:Well defined, not attached] [[name]:[name]] Wound Margin: [3:Medium (34-66%)] [4:Medium (34-66%)] [[name]:[name]] Granulation A mount: [3:Red] [4:Pink] [[name]:[name]] Granulation Quality: [3:Medium (34-66%)] [4:Medium (34-66%)] [[name]:[name]] Necrotic Amount: [3:Fat Layer (Subcutaneous Tissue): Yes Fat Layer (Subcutaneous Tissue): Yes [name]] Exposed Structures: [3:Fascia: No Tendon: No Muscle: No Joint: No Bone: No None] [4:Fascia: No Tendon: No Muscle: No Joint: No Bone: No None] [[name]:[name]] Epithelialization: [3:Compression Therapy] [4:Compression Therapy] [[name]:[name]] Treatment Notes Electronic Signature(s) Signed: 07/21/2020 4:57:27 PM By: [name] MD Signed: 07/21/2020 5:21:53 PM By: [name] Entered By: [name] on 07/21/2020 13:21:58 -------------------------------------------------------------------------------- Multi-Disciplinary Care Plan Details Patient Name: Date of Service: Jamie Brink, [name] 07/21/2020 12:45 PM Medical Record Number: [id number] Patient Account Number: [id number] Date of Birth/Sex: Treating RN: December 13, 1940 (80 y.o. [name] Primary Care Yatzari Jonsson: Hiram Gash RK, Lorenso Quarry Other Clinician: Referring Makaylee Spielberg: Treating Saheed Carrington/Extender: Diamantina Monks [name]-CLA RK, V A LENICA Weeks in Treatment: 3 Active Inactive Venous Leg Ulcer Nursing Diagnoses: Actual venous Insuffiency (use after diagnosis is confirmed) Goals: Patient will maintain optimal edema control Date Initiated: 06/30/2020 Target Resolution Date: 08/04/2020 Goal Status:  Active Interventions: Assess peripheral edema status every visit. Compression as ordered Notes: Wound/Skin Impairment Nursing Diagnoses: Impaired tissue integrity Goals: Patient/caregiver will verbalize understanding of skin care regimen Date Initiated: 06/30/2020 Target Resolution Date: 08/04/2020 Goal Status: Active Ulcer/skin breakdown will have a volume reduction of 30% by week 4 Date Initiated: 06/30/2020 Target Resolution Date: 08/04/2020 Goal Status: Active Interventions: Assess patient/caregiver ability to obtain necessary supplies Assess patient/caregiver ability to perform ulcer/skin care regimen upon admission and as needed Assess ulceration(s) every visit Provide education on ulcer and skin care Treatment Activities: Skin care regimen initiated : 06/30/2020 Topical wound management initiated : 06/30/2020 Notes: Electronic Signature(s) Signed: 07/21/2020 1:02:38 PM By: [name] Entered By: [name] on 07/21/2020 13:02:38 -------------------------------------------------------------------------------- Pain Assessment Details Patient Name: Date of Service: Jamie Brink, Jamie Ditto W. 07/21/2020 12:45 PM Medical Record Number: [id number] Patient Account Number: [id number] Date of Birth/Sex: Treating RN: May 07, 1940 (80 y.o. [name] Primary Care Benno Brensinger: Hiram Gash RK, Lorenso Quarry Other Clinician: Referring Shelbey Spindler: Treating Elenie Coven/Extender: Diamantina Monks [name]-CLA RK, V A LENICA Weeks in Treatment: 3 Active Problems Location of Pain Severity and Description of Pain Patient Has Paino Yes Site Locations Pain Location: Pain in Ulcers With  Dressing Change: Yes Duration of the Pain. Constant / Intermittento Constant Rate the pain. Current Pain Level: 7 Character of Pain Describe the Pain: Burning, Throbbing Pain Management and Medication Current Pain Management: Medication: Yes Cold Application: No Rest: No Massage: No Activity:  No T.E.[name].S.: No Heat Application: No Leg drop or elevation: No Is the Current Pain Management Adequate: Adequate How does your wound impact your activities of daily livingo Sleep: No Bathing: No Appetite: No Relationship With Others: No Bladder Continence: No Emotions: No Bowel Continence: No Work: No Toileting: No Drive: No Dressing: No Hobbies: No Electronic Signature(s) Signed: 07/24/2020 5:39:31 PM By: Zandra Abts RN, BSN Entered By: Zandra Abts on 07/21/2020 12:52:20 -------------------------------------------------------------------------------- Patient/Caregiver Education Details Patient Name: Date of Service: Jamie [name] 4/15/2022andnbsp12:45 PM Medical Record Number: [id number] Patient Account Number: [id number] Date of Birth/Gender: Treating RN: 1940-05-10 (80 y.o. [name] Primary Care Physician: Hiram Gash RK, Lorenso Quarry Other Clinician: Referring Physician: Treating Physician/Extender: Diamantina Monks [name]-CLA RK, V A LENICA Weeks in Treatment: 3 Education Assessment Education Provided To: Patient and Caregiver Education Topics Provided Venous: Methods: Explain/Verbal, Printed Responses: State content correctly Wound/Skin Impairment: Methods: Explain/Verbal, Printed Responses: State content correctly Electronic Signature(s) Signed: 07/21/2020 5:21:53 PM By: [name] Entered By: [name] on 07/21/2020 13:03:32 -------------------------------------------------------------------------------- Wound Assessment Details Patient Name: Date of Service: Jamie Brink, Jamie Ditto W. 07/21/2020 12:45 PM Medical Record Number: [id number] Patient Account Number: [id number] Date of Birth/Sex: Treating RN: [birth year] (80 y.o. [name] Primary Care Gabriel Paulding: Hiram Gash RK, Lorenso Quarry Other Clinician: Referring Jennfier Abdulla: Treating Tin Engram/Extender: Diamantina Monks [name]-CLA RK, V A LENICA Weeks in  Treatment: 3 Wound Status Wound Number: 3 Primary Venous Leg Ulcer Etiology: Wound Location: Left, Distal, Medial Lower Leg Wound Open Wounding Event: Gradually Appeared Status: Date Acquired: 04/09/2019 Comorbid Sleep Apnea, Hypertension, Peripheral Venous Disease, Weeks Of Treatment: 3 History: Osteoarthritis, Neuropathy Clustered Wound: No Photos Wound Measurements Length: (cm) 9.5 Width: (cm) 4.5 Depth: (cm) 0.2 Area: (cm) 33.576 Volume: (cm) 6.715 % Reduction in Area: -374% % Reduction in Volume: -373.9% Epithelialization: None Tunneling: No Undermining: No Wound Description Classification: Full Thickness Without Exposed Support Structures Wound Margin: Well defined, not attached Exudate Amount: Medium Exudate Type: Purulent Exudate Color: yellow, brown, green Foul Odor After Cleansing: Yes Due to Product Use: No Slough/Fibrino Yes Wound Bed Granulation Amount: Medium (34-66%) Exposed Structure Granulation Quality: Red Fascia Exposed: No Necrotic Amount: Medium (34-66%) Fat Layer (Subcutaneous Tissue) Exposed: Yes Necrotic Quality: Adherent Slough Tendon Exposed: No Muscle Exposed: No Joint Exposed: No Bone Exposed: No Electronic Signature(s) Signed: 07/24/2020 5:39:31 PM By: Zandra Abts RN, BSN Signed: 07/25/2020 8:07:32 AM By: Karl Ito Entered By: Karl Ito on 07/21/2020 16:53:42 -------------------------------------------------------------------------------- Wound Assessment Details Patient Name: Date of Service: Jamie Brink, Jamie Ditto W. 07/21/2020 12:45 PM Medical Record Number: [id number] Patient Account Number: [id number] Date of Birth/Sex: Treating RN: Aug 13, 1940 (80 y.o. [name] Primary Care Marlan Steward: Hiram Gash RK, Lorenso Quarry Other Clinician: Referring Tamiko Leopard: Treating Mayah Urquidi/Extender: Diamantina Monks [name]-CLA RK, V A LENICA Weeks in Treatment: 3 Wound Status Wound Number: 4 Primary Venous Leg  Ulcer Etiology: Wound Location: Left, Posterior Lower Leg Wound Open Wounding Event: Gradually Appeared Status: Date Acquired: 04/09/2019 Comorbid Sleep Apnea, Hypertension, Peripheral Venous Disease, Weeks Of Treatment: 3 History: Osteoarthritis, Neuropathy Clustered Wound: No Photos Wound Measurements Length: (cm) 2.4 Width: (cm) 2.2 Depth: (cm) 0.2 Area: (cm) 4.147 Volume: (cm) 0.829 % Reduction in Area: -428.3% %  Reduction in Volume: -428% Epithelialization: None Tunneling: No Undermining: No Wound Description Classification: Full Thickness Without Exposed Support Structures Wound Margin: Well defined, not attached Exudate Amount: Medium Exudate Type: Purulent Exudate Color: yellow, brown, green Foul Odor After Cleansing: Yes Due to Product Use: No Slough/Fibrino Yes Wound Bed Granulation Amount: Medium (34-66%) Exposed Structure Granulation Quality: Pink Fascia Exposed: No Necrotic Amount: Medium (34-66%) Fat Layer (Subcutaneous Tissue) Exposed: Yes Necrotic Quality: Adherent Slough Tendon Exposed: No Muscle Exposed: No Joint Exposed: No Bone Exposed: No Electronic Signature(s) Signed: 07/24/2020 5:39:31 PM By: Zandra Abts RN, BSN Signed: 07/25/2020 8:07:32 AM By: Karl Ito Entered By: Karl Ito on 07/21/2020 16:54:33 -------------------------------------------------------------------------------- Vitals Details Patient Name: Date of Service: Jamie Brink, [name] 07/21/2020 12:45 PM Medical Record Number: [id number] Patient Account Number: [id number] Date of Birth/Sex: Treating RN: Jun 29, 1940 (80 y.o. [name] Primary Care Ocia Simek: Hiram Gash RK, Lorenso Quarry Other Clinician: Referring Porsha Skilton: Treating Jkai Arwood/Extender: Diamantina Monks [name]-CLA RK, V A LENICA Weeks in Treatment: 3 Vital Signs Time Taken: 12:51 Temperature (F): 98.5 Height (in): 63 Pulse (bpm): 90 Weight (lbs): 150 Respiratory Rate  (breaths/min): 16 Body Mass Index (BMI): 26.6 Blood Pressure (mmHg): 124/86 Reference Range: 80 - 120 mg / dl Electronic Signature(s) Signed: 07/24/2020 5:39:31 PM By: Zandra Abts RN, BSN Entered By: Zandra Abts on 07/21/2020 12:51:52

## 2020-07-28 ENCOUNTER — Other Ambulatory Visit: Payer: Self-pay

## 2020-07-28 ENCOUNTER — Encounter (HOSPITAL_BASED_OUTPATIENT_CLINIC_OR_DEPARTMENT_OTHER): Payer: Medicare Other | Admitting: Internal Medicine

## 2020-07-28 DIAGNOSIS — I87332 Chronic venous hypertension (idiopathic) with ulcer and inflammation of left lower extremity: Secondary | ICD-10-CM | POA: Diagnosis not present

## 2020-07-28 NOTE — Progress Notes (Signed)
Jamie, [name] ([id number]) Visit Report for 07/28/2020 Chief Complaint Document Details Patient Name: Date of Service: Jamie [name], Jamie [name]. 07/28/2020 1:45 PM Medical Record Number: [id number] Patient Account Number: [id number] Date of Birth/Sex: Treating RN: [birth year] (80 y.o. [name] Primary Care Provider: Hiram Gash RK, Lorenso Quarry Other Clinician: Referring Provider: Treating Provider/Extender: Olive Bass N-CLA RK, V A LENICA Weeks in Treatment: 4 Information Obtained from: Patient Chief Complaint 06/30/2020; patient is here for review of wounds centered on her left medial lower leg and ankle Electronic Signature(s) Signed: 07/28/2020 4:48:05 PM By: Geralyn Corwin DO Entered By: Geralyn Corwin on 07/28/2020 16:34:46 -------------------------------------------------------------------------------- Debridement Details Patient Name: Date of Service: Jamie [name], Jamie Ditto W. 07/28/2020 1:45 PM Medical Record Number: [id number] Patient Account Number: [id number] Date of Birth/Sex: Treating RN: 1940/10/10 (80 y.o. [name] Primary Care Provider: Hiram Gash RK, Lorenso Quarry Other Clinician: Referring Provider: Treating Provider/Extender: Olive Bass N-CLA RK, V A LENICA Weeks in Treatment: 4 Debridement Performed for Assessment: Wound #3 Left,Distal,Medial Lower Leg Performed By: Physician Geralyn Corwin, DO Debridement Type: Chemical/Enzymatic/Mechanical Agent Used: Gauze Severity of Tissue Pre Debridement: Fat layer exposed Level of Consciousness (Pre-procedure): Awake and Alert Pre-procedure Verification/Time Out Yes - 15:15 Taken: Start Time: 15:16 Instrument: Other : Gauze Bleeding: None End Time: 15:19 Response to Treatment: Procedure was tolerated well Level of Consciousness (Post- Awake and Alert procedure): Post Debridement Measurements of Total Wound Length: (cm) 10 Width: (cm) 4 Depth: (cm) 0.2 Volume:  (cm) 6.283 Character of Wound/Ulcer Post Debridement: Stable Severity of Tissue Post Debridement: Fat layer exposed Post Procedure Diagnosis Same as Pre-procedure Electronic Signature(s) Signed: 07/28/2020 4:48:05 PM By: Geralyn Corwin DO Signed: 07/28/2020 5:01:44 PM By: [name] Entered By: [name] on 07/28/2020 15:20:19 -------------------------------------------------------------------------------- Debridement Details Patient Name: Date of Service: Jamie [name], Jamie Ditto W. 07/28/2020 1:45 PM Medical Record Number: [id number] Patient Account Number: [id number] Date of Birth/Sex: Treating RN: [birth year] (80 y.o. [name] Primary Care Provider: Hiram Gash RK, Lorenso Quarry Other Clinician: Referring Provider: Treating Provider/Extender: Olive Bass N-CLA RK, V A LENICA Weeks in Treatment: 4 Debridement Performed for Assessment: Wound #4 Left,Posterior Lower Leg Performed By: Physician Geralyn Corwin, DO Debridement Type: Chemical/Enzymatic/Mechanical Agent Used: Gauze Severity of Tissue Pre Debridement: Fat layer exposed Level of Consciousness (Pre-procedure): Awake and Alert Pre-procedure Verification/Time Out Yes - 15:15 Taken: Start Time: 15:19 Instrument: Other : Gauze Bleeding: None End Time: 15:21 Response to Treatment: Procedure was tolerated well Level of Consciousness (Post- Awake and Alert procedure): Post Debridement Measurements of Total Wound Length: (cm) 2.6 Width: (cm) 2 Depth: (cm) 0.2 Volume: (cm) 0.817 Character of Wound/Ulcer Post Debridement: Stable Severity of Tissue Post Debridement: Fat layer exposed Post Procedure Diagnosis Same as Pre-procedure Electronic Signature(s) Signed: 07/28/2020 4:48:05 PM By: Geralyn Corwin DO Signed: 07/28/2020 5:01:44 PM By: [name] Entered By: [name] on 07/28/2020 15:21:05 -------------------------------------------------------------------------------- HPI  Details Patient Name: Date of Service: Jamie [name], Jamie Ditto W. 07/28/2020 1:45 PM Medical Record Number: [id number] Patient Account Number: [id number] Date of Birth/Sex: Treating RN: 1941/03/29 (80 y.o. [name] Primary Care Provider: Hiram Gash RK, Lorenso Quarry Other Clinician: Referring Provider: Treating Provider/Extender: Olive Bass N-CLA RK, V A LENICA Weeks in Treatment: 4 History of Present Illness HPI Description: ADMISSION 06/30/2020 Jamie [name] is an 80 year old woman who lives in [place]. She is here with her niece for review of wounds on the left medial lower leg and ankle.  These have apparently been present for over a year and she followed with Dr. Olegario Messier at the [facility] in [place] for quite a period of time although it looks as though there was a initial consult wound from Dr. Marcha Solders on February 18 presumably there was therefore hiatus. At that point the wounds were described as being there for 3 months. She also tells me she was at the wound care center in [place] for a period of time with this. 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 She had venous studies that were negative for DVT ABIs on the right were 1.01 on the left 1.06. She has . had previous applications of puraply, compression which she does not tolerate very well. She has had several rounds of oral antibiotic therapy. She complains of unrelenting pain and she is seeing Dr. Reece Agar V of pain management apparently was on oxycodone but that did not help. She also had a skin biopsy done by Dr. Olegario Messier although we do not have that result. She does not appear to have an arterial issue. I am not completely clear what she has been putting on the wounds lately. 3/31; this patient has a particularly nasty set of wounds on the left medial ankle in the middle of what looks to be hemosiderin deposition secondary to chronic venous insufficiency.  She has a lot of pain followed by pain management. Dr. Marcha Solders apparently did a biopsy of something on the left leg last fall what I would like to get this result. She has a history of MRSA treatment. I do not believe she had reflux studies but she did have DVT rule out studies. Previous ABIs have not suggested arterial insufficiency 4/7; difficult wounds on the left medial ankle probably chronic venous insufficiency. With considerable effort on behalf of our case manager we were able to finally to speak to somebody at the hospital in [place] who indicated that no biopsy of this area have been done even though the patient describes this in some detail and is even able to point out where she thinks the biopsy was done. We have been using Sorbact. The PCR culture I did showed polymicrobial identification with Pseudomonas, staph aureus, Peptostreptococcus. All of this and low titers. Resistance genes identified were MRSA, staph virulence gene and tetracycline. We are going to send this to [facility] for a topical antibiotic which is something that we have had good success with recently in large venous ulcers with a lot of purulent drainage. There would not be an easy oral alternative here possibly line escalated and ciprofloxacin if we need to use systemic antibiotic 4/15; difficult area on the left medial ankle. Most likely chronic venous insufficiency. I think she will probably need venous reflux study I think she had DVT rule outs but not venous reflux studies. We have not yet obtained the topical antibiotics. She has home health changing the dressing we have been using Sorbact for adherent fibrinous debris on the surface. Very difficult to remove 4/22; patient presents for 1 week follow-up. She has been using sore back under compression wraps and these are changed 3 times a week with home health. She also had Keystone antibiotics sent to her house and brought them in today. She has no complaints or issues  today. Electronic Signature(s) Signed: 07/28/2020 4:48:05 PM By: Geralyn Corwin DO Entered By: Geralyn Corwin on 07/28/2020 16:38:45 -------------------------------------------------------------------------------- Physical Exam Details Patient Name: Date of Service: Jamie Brink, [name] 07/28/2020  1:45 PM Medical Record Number: [id number] Patient Account Number: [id number] Date of Birth/Sex: Treating RN: 1941/01/20 (80 y.o. [name] Primary Care Provider: Hiram Gash RK, Lorenso Quarry Other Clinician: Referring Provider: Treating Provider/Extender: Olive Bass N-CLA RK, V A LENICA Weeks in Treatment: 4 Notes Left lower extremity: There are 2 wounds one located to the distal medial leg and the other to the posterior lower leg. Both of these have sloughing and patches of granulation tissue present. There were no signs of infection. Patient would not allow sharp debridement today. Electronic Signature(s) Signed: 07/28/2020 4:48:05 PM By: Geralyn Corwin DO Entered By: Geralyn Corwin on 07/28/2020 16:43:11 -------------------------------------------------------------------------------- Physician Orders Details Patient Name: Date of Service: Jamie [name], Jamie Ditto W. 07/28/2020 1:45 PM Medical Record Number: [id number] Patient Account Number: [id number] Date of Birth/Sex: Treating RN: 1940/07/30 (80 y.o. 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 - Friday Return A Bathing/ Shower/  Hygiene May shower with protection but do not get wound dressing(s) wet. Edema Control - Lymphedema / SCD / Other Elevate legs to the level of the heart or above for 30 minutes daily and/or when sitting, a frequency of: - throughout the day 3-4 times a day. Avoid standing for long periods of time. Exercise regularly Additional Orders / Instructions Follow Nutritious Diet Home Health New wound care orders this week; continue Home Health for wound care. May utilize formulary equivalent dressing for wound treatment orders unless otherwise specified. - Apply Keystone Compound-Follow package directions Other Home Health Orders/Instructions: - [facility] fax #[phone]; Wound center weekly on Fridays and home health to change on Mondays and Wednesdays. Apply Hydrogel only, not Hydrogel gauze/film. Wound Treatment Wound #3 - Lower Leg Wound Laterality: Left, Medial, Distal Cleanser: Soap and Water (Home Health) 2 x Per Week/30 Days Discharge Instructions: May shower and wash wound with dial antibacterial soap and water prior to dressing change. Cleanser: Wound Cleanser (Home Health) 2 x Per Week/30 Days Discharge Instructions: Cleanse the wound with wound cleanser prior to applying a clean dressing using gauze sponges, not tissue or cotton balls. Peri-Wound Care: Triamcinolone 15 (g) 2 x Per Week/30 Days Discharge Instructions: In clinic only. Apply to red, irritated periwound Peri-Wound Care: Sween Lotion (Moisturizing lotion) (Home Health) 2 x Per Week/30 Days Discharge Instructions: apply at home. Apply moisturizing lotion as directed Topical: Keystone Antibiotic Compound (Home Health) 2 x Per Week/30 Days Discharge Instructions: Apply per instructions-In office only Prim Dressing: Cutimed Sorbact Swab (Home Health) 2 x Per Week/30 Days ary Discharge Instructions: Apply to wound Secondary Dressing: Woven Gauze Sponge, Non-Sterile 4x4 in (Home Health) 2 x Per Week/30  Days Discharge Instructions: Apply over primary dressing as directed. Secondary Dressing: ABD Pad, 8x10 (Home Health) 2 x Per Week/30 Days Discharge Instructions: Apply over primary dressing as directed. Secondary Dressing: CarboFLEX Odor Control Dressing, 4x4 in (Home Health) 2 x Per Week/30 Days Discharge Instructions: Apply over primary dressing in office only Compression Wrap: ThreePress (3 layer compression wrap) 2 x Per Week/30 Days Discharge Instructions: Apply three layer compression as directed. Wound #4 - Lower Leg Wound Laterality: Left,  Posterior Cleanser: Soap and Water [facility]) 2 x Per Week/30 Days Discharge Instructions: May shower and wash wound with dial antibacterial soap and water prior to dressing change. Cleanser: Wound Cleanser (Home Health) 2 x Per Week/30 Days Discharge Instructions: Cleanse the wound with wound cleanser prior to applying a clean dressing using gauze sponges, not tissue or cotton balls. Peri-Wound Care: Triamcinolone 15 (g) 2 x Per Week/30 Days Discharge Instructions: In clinic only. Apply to red, irritated periwound Peri-Wound Care: Sween Lotion (Moisturizing lotion) (Home Health) 2 x Per Week/30 Days Discharge Instructions: apply at home. Apply moisturizing lotion as directed Topical: Keystone Antibiotic Compound (Home Health) 2 x Per Week/30 Days Prim Dressing: Cutimed Sorbact Swab (Home Health) 2 x Per Week/30 Days ary Discharge Instructions: Apply to wound Prim Dressing: keystone antibiotics (Home Health) 2 x Per Week/30 Days ary Discharge Instructions: patient to purchase the topical Keystone Antibiotics when the company calls. Home Health to mix and apply under the cutimed sorbact swab with dressing changes. patient to bring topical antibiotics into clinic. Secondary Dressing: Woven Gauze Sponge, Non-Sterile 4x4 in (Home Health) 2 x Per Week/30 Days Discharge Instructions: Apply over primary dressing as directed. Secondary Dressing: ABD  Pad, 8x10 (Home Health) 2 x Per Week/30 Days Discharge Instructions: Apply over primary dressing as directed. Secondary Dressing: CarboFLEX Odor Control Dressing, 4x4 in (Home Health) 2 x Per Week/30 Days Discharge Instructions: Apply over primary dressing in office only Compression Wrap: ThreePress (3 layer compression wrap) 2 x Per Week/30 Days Discharge Instructions: Apply three layer compression as directed. Electronic Signature(s) Signed: 07/28/2020 4:48:05 PM By: Geralyn Corwin DO Previous Signature: 07/28/2020 2:12:49 PM Version By: [name] Entered By: Geralyn Corwin on 07/28/2020 16:43:36 -------------------------------------------------------------------------------- Problem List Details Patient Name: Date of Service: Jamie [name], Jamie Ditto W. 07/28/2020 1:45 PM Medical Record Number: [id number] Patient Account Number: [id number] Date of Birth/Sex: Treating RN: 09/21/1940 (80 y.o. 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 07/28/2020 1:45 PM Medical Record Number: [id number] Patient Account Number: [id number] Date of  Birth/Sex: Treating RN: Aug 12, 1940 (80 y.o. [name] Primary Care Provider: Hiram Gash RK, Lorenso Quarry Other Clinician: Referring Provider: Treating Provider/Extender: Olive Bass N-CLA RK, V A LENICA Weeks in Treatment: 4 Subjective Chief Complaint Information obtained from Patient 06/30/2020; patient is here for review of wounds centered on her left medial lower leg and ankle History of Present Illness (HPI) ADMISSION 06/30/2020 Jamie [name] is an 80 year old woman who lives in [place]. She is here with her niece for review of wounds on the left medial lower leg and ankle. These have apparently been present for over a year and she followed with Dr. Olegario Messier at the [facility] in [place] for quite a period of time although it looks as though there was a initial consult wound from Dr. Marcha Solders on February 18 presumably there  was therefore hiatus. At that point the wounds were described as being there for 3 months. She also tells me she was at the wound care center in [place] for a period of time with this. 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 She had venous studies that were negative for DVT ABIs on the right were 1.01 on the left 1.06. She has . had previous applications of puraply, compression which she does not tolerate very well. She has had several rounds of oral antibiotic therapy. She complains of unrelenting pain and she is seeing Dr. Reece Agar V of pain management apparently was on oxycodone but that did not help. She also had a skin biopsy done by Dr. Olegario Messier although we do not have that result. She does not appear to have an arterial issue. I am not completely clear what she has been putting on the wounds lately. 3/31; this patient has a particularly nasty set of wounds on the left medial ankle in the middle of what looks to be hemosiderin deposition secondary to chronic venous insufficiency. She has a lot  of pain followed by pain management. Dr. Marcha Solders apparently did a biopsy of something on the left leg last fall what I would like to get this result. She has a history of MRSA treatment. I do not believe she had reflux studies but she did have DVT rule out studies. Previous ABIs have not suggested arterial insufficiency 4/7; difficult wounds on the left medial ankle probably chronic venous insufficiency. With considerable effort on behalf of our case manager we were able to finally to speak to somebody at the hospital in [place] who indicated that no biopsy of this area have been done even though the patient describes this in some detail and is even able to point out where she thinks the biopsy was done. We have been using Sorbact. The PCR culture I did showed polymicrobial identification with Pseudomonas, staph aureus, Peptostreptococcus. All of this and low titers. Resistance genes identified were MRSA, staph virulence gene and tetracycline. We are going to send this to [facility] for a topical antibiotic which is something that we have had good success with recently in large venous ulcers with a lot of purulent drainage. There would not be an easy oral alternative here possibly line escalated and ciprofloxacin if we need to use systemic antibiotic 4/15; difficult area on the left medial ankle. Most likely chronic venous insufficiency. I think she will probably need venous reflux study I think she had DVT rule outs but not venous reflux studies. We have not yet obtained the topical antibiotics. She has home health changing the dressing we have been using Sorbact for adherent fibrinous debris on the surface. Very difficult to remove 4/22; patient presents for 1 week follow-up. She has been using sore back under compression wraps and these are changed 3 times a week with home health. She also had Keystone antibiotics sent to her house and brought them in today. She has no complaints or issues today. Patient  History Information obtained from Patient. Family History Unknown History. Social History Former smoker, Marital Status - Single, Alcohol Use - Never, Drug Use - No History, Caffeine Use - Rarely. Medical History Respiratory Patient has history of Sleep Apnea Cardiovascular Patient has history of Hypertension, Peripheral Venous Disease Musculoskeletal Patient has history of Osteoarthritis Neurologic Patient has history of Neuropathy Medical A Surgical History Notes nd Cardiovascular Heart murmur Psychiatric Anxiety, depression Objective Constitutional Vitals Time Taken:  2:01 PM, Height: 63 in, Weight: 150 lbs, BMI: 26.6, Temperature: 98.3 F, Pulse: 83 bpm, Respiratory Rate: 16 breaths/min, Blood Pressure: 153/71 mmHg. Integumentary (Hair, Skin) Wound #3 status is Open. Original cause of wound was Gradually Appeared. The date acquired was: 04/09/2019. The wound has been in treatment 4 weeks. The wound is located on the Left,Distal,Medial Lower Leg. The wound measures 10cm length x 4cm width x 0.2cm depth; 31.416cm^2 area and 6.283cm^3 volume. There is Fat Layer (Subcutaneous Tissue) exposed. There is no tunneling or undermining noted. There is a large amount of serosanguineous drainage noted. Foul odor after cleansing was noted. The wound margin is flat and intact. There is large (67-100%) red granulation within the wound bed. There is a small (1-33%) amount of necrotic tissue within the wound bed including Adherent Slough. Wound #4 status is Open. Original cause of wound was Gradually Appeared. The date acquired was: 04/09/2019. The wound has been in treatment 4 weeks. The wound is located on the Left,Posterior Lower Leg. The wound measures 2.6cm length x 2cm width x 0.2cm depth; 4.084cm^2 area and 0.817cm^3 volume. There is Fat Layer (Subcutaneous Tissue) exposed. There is no tunneling or undermining noted. There is a medium amount of serosanguineous drainage noted. Foul odor after  cleansing was noted. The wound margin is distinct with the outline attached to the wound base. There is large (67-100%) red granulation within the wound bed. There is a small (1-33%) amount of necrotic tissue within the wound bed including Adherent Slough. 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 The wounds appear stable with no signs of infection. We will continue current regimen of sorbact, 3 layer compression and will add Keystone antibiotics today. This can be changed 3 times a week with home health. We will see her next week for follow-up Procedures Wound #3 Pre-procedure diagnosis of Wound #3 is a Venous Leg Ulcer located on the Left,Distal,Medial Lower Leg .Severity of Tissue Pre Debridement is: Fat layer exposed. There was a Chemical/Enzymatic/Mechanical debridement performed by Geralyn Corwin, DO. With the following instrument(s): Gauze. Other agent used was Gauze. A time out was conducted at 15:15, prior to the start of the procedure. There was no bleeding. The procedure was tolerated well. Post Debridement Measurements: 10cm length x 4cm width x 0.2cm depth; 6.283cm^3 volume. Character of Wound/Ulcer Post Debridement is stable. Severity of Tissue Post Debridement is: Fat layer exposed. Post procedure Diagnosis Wound #3: Same as Pre-Procedure Pre-procedure diagnosis of Wound #3 is a Venous Leg Ulcer located on the Left,Distal,Medial Lower Leg . There was a Three Layer Compression Therapy Procedure by [name], RN. Post procedure Diagnosis Wound #3: Same as Pre-Procedure Wound #4 Pre-procedure diagnosis of Wound #4 is a Venous Leg Ulcer located on the Left,Posterior Lower Leg .Severity of Tissue Pre Debridement is: Fat  layer exposed. There was a Chemical/Enzymatic/Mechanical debridement performed by Geralyn Corwin, DO. With the following instrument(s): Gauze. Other agent used was Gauze. A time out was conducted at 15:15, prior to the start of the procedure. There was no bleeding. The procedure was tolerated well. Post Debridement Measurements: 2.6cm length x 2cm width x 0.2cm depth; 0.817cm^3 volume. Character of Wound/Ulcer Post Debridement is stable. Severity of Tissue Post Debridement is: Fat layer exposed. Post procedure Diagnosis Wound #4: Same as  Pre-Procedure Pre-procedure diagnosis of Wound #4 is a Venous Leg Ulcer located on the Left,Posterior Lower Leg . There was a Three Layer Compression Therapy Procedure by Antonieta IbaBarnhart, Jodi, RN. Post procedure Diagnosis Wound #4: Same as Pre-Procedure Plan Follow-up Appointments: Return Appointment in 1 week. - Friday Bathing/ Shower/ Hygiene: May shower with protection but do not get wound dressing(s) wet. Edema Control - Lymphedema / SCD / Other: Elevate legs to the level of the heart or above for 30 minutes daily and/or when sitting, a frequency of: - throughout the day 3-4 times a day. Avoid standing for long periods of time. Exercise regularly Additional Orders / Instructions: Follow Nutritious Diet Home Health: New wound care orders this week; continue Home Health for wound care. May utilize formulary equivalent dressing for wound treatment orders unless otherwise specified. - Apply Keystone Compound-Follow package directions Other Home Health Orders/Instructions: - [facility] fax #[phone]; Wound center weekly on Fridays and home health to change on Mondays and Wednesdays. Apply Hydrogel only, not Hydrogel gauze/film. WOUND #3: - Lower Leg Wound Laterality: Left, Medial, Distal Cleanser: Soap and Water (Home Health) 2 x Per Week/30 Days Discharge Instructions: May shower and wash wound with dial antibacterial soap and water  prior to dressing change. Cleanser: Wound Cleanser (Home Health) 2 x Per Week/30 Days Discharge Instructions: Cleanse the wound with wound cleanser prior to applying a clean dressing using gauze sponges, not tissue or cotton balls. Peri-Wound Care: Triamcinolone 15 (g) 2 x Per Week/30 Days Discharge Instructions: In clinic only. Apply to red, irritated periwound Peri-Wound Care: Sween Lotion (Moisturizing lotion) (Home Health) 2 x Per Week/30 Days Discharge Instructions: apply at home. Apply moisturizing lotion as directed Topical: Keystone Antibiotic Compound (Home Health) 2 x Per Week/30 Days Discharge Instructions: Apply per instructions-In office only Prim Dressing: Cutimed Sorbact Swab (Home Health) 2 x Per Week/30 Days ary Discharge Instructions: Apply to wound Secondary Dressing: Woven Gauze Sponge, Non-Sterile 4x4 in (Home Health) 2 x Per Week/30 Days Discharge Instructions: Apply over primary dressing as directed. Secondary Dressing: ABD Pad, 8x10 (Home Health) 2 x Per Week/30 Days Discharge Instructions: Apply over primary dressing as directed. Secondary Dressing: CarboFLEX Odor Control Dressing, 4x4 in (Home Health) 2 x Per Week/30 Days Discharge Instructions: Apply over primary dressing in office only Com pression Wrap: ThreePress (3 layer compression wrap) 2 x Per Week/30 Days Discharge Instructions: Apply three layer compression as directed. WOUND #4: - Lower Leg Wound Laterality: Left, Posterior Cleanser: Soap and Water (Home Health) 2 x Per Week/30 Days Discharge Instructions: May shower and wash wound with dial antibacterial soap and water prior to dressing change. Cleanser: Wound Cleanser (Home Health) 2 x Per Week/30 Days Discharge Instructions: Cleanse the wound with wound cleanser prior to applying a clean dressing using gauze sponges, not tissue or cotton balls. Peri-Wound Care: Triamcinolone 15 (g) 2 x Per Week/30 Days Discharge Instructions: In clinic only. Apply to  red, irritated periwound Peri-Wound Care: Sween Lotion (Moisturizing lotion) (Home Health) 2 x Per Week/30 Days Discharge Instructions: apply at home. Apply moisturizing lotion as directed Topical: Keystone Antibiotic Compound (Home Health) 2 x Per Week/30 Days Prim Dressing: Cutimed Sorbact Swab (Home Health) 2 x Per Week/30 Days ary Discharge Instructions: Apply to wound Prim Dressing: keystone antibiotics (Home Health) 2 x Per Week/30 Days ary Discharge Instructions: patient to purchase the topical Keystone Antibiotics when the company calls. Home Health to mix and apply under the cutimed sorbact swab with dressing changes. patient to bring topical antibiotics  into clinic. Secondary Dressing: Woven Gauze Sponge, Non-Sterile 4x4 in (Home Health) 2 x Per Week/30 Days Discharge Instructions: Apply over primary dressing as directed. Secondary Dressing: ABD Pad, 8x10 (Home Health) 2 x Per Week/30 Days Discharge Instructions: Apply over primary dressing as directed. Secondary Dressing: CarboFLEX Odor Control Dressing, 4x4 in (Home Health) 2 x Per Week/30 Days Discharge Instructions: Apply over primary dressing in office only Com pression Wrap: ThreePress (3 layer compression wrap) 2 x Per Week/30 Days Discharge Instructions: Apply three layer compression as directed. 1. Sorbact with 3-layer compression 2. Apply keystone antibiotic 3. Follow-up next week Electronic Signature(s) Signed: 07/28/2020 4:48:05 PM By: Geralyn Corwin DO Entered By: Geralyn Corwin on 07/28/2020 16:45:45 -------------------------------------------------------------------------------- HxROS Details Patient Name: Date of Service: Jamie [name], Jamie Ditto W. 07/28/2020 1:45 PM Medical Record Number: [id number] Patient Account Number: [id number] Date of Birth/Sex: Treating RN: [birth year] (80 y.o. [name] Primary Care Provider: Hiram Gash RK, Lorenso Quarry Other Clinician: Referring Provider: Treating  Provider/Extender: Olive Bass N-CLA RK, V A LENICA Weeks in Treatment: 4 Information Obtained From Patient Respiratory Medical History: Positive for: Sleep Apnea Cardiovascular Medical History: Positive for: Hypertension; Peripheral Venous Disease Past Medical History Notes: Heart murmur Musculoskeletal Medical History: Positive for: Osteoarthritis Neurologic Medical History: Positive for: Neuropathy Psychiatric Medical History: Past Medical History Notes: Anxiety, depression Immunizations Pneumococcal Vaccine: Received Pneumococcal Vaccination: Yes Implantable Devices None Family and Social History Unknown History: Yes; Former smoker; Marital Status - Single; Alcohol Use: Never; Drug Use: No History; Caffeine Use: Rarely; Financial Concerns: No; Food, Clothing or Shelter Needs: No; Support System Lacking: No; Transportation Concerns: No Electronic Signature(s) Signed: 07/28/2020 4:48:05 PM By: Geralyn Corwin DO Signed: 07/28/2020 5:01:44 PM By: [name] Entered By: Geralyn Corwin on 07/28/2020 16:38:52 -------------------------------------------------------------------------------- SuperBill Details Patient Name: Date of Service: Jamie [name], Terald Sleeper. 07/28/2020 Medical Record Number: [id number] Patient Account Number: [id number] Date of Birth/Sex: Treating RN: 1940/09/04 (80 y.o. 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

## 2020-08-01 NOTE — Progress Notes (Signed)
Jamie [name], Jamie W. ([id number]) Visit Report for 07/28/2020 Arrival Information Details Patient Name: Date of Service: Jamie BrinkWILSO [name], Jamie RGEURITE W. 07/28/2020 1:45 PM Medical Record Number: [id number] Patient Account Number: [id number] Date of Birth/Sex: Treating RN: 03/18/1941 (80 y.o. Jamie CluckF) Barnhart, Jodi Primary Care Keifer Habib: Hiram GashEGGLESTO [name]-CLA [name], Jamie QuarryV A LENICA Other Clinician: Referring Ethel Veronica: Treating Sherilynn Dieu/Extender: Olive BassHoffman, Jessica EGGLESTO [name]-CLA [name], V A LENICA Weeks in Treatment: 4 Visit Information History Since Last Visit Added or deleted any medications: No Patient Arrived: Wheel Chair Any new allergies or adverse reactions: No Arrival Time: 14:01 Had a fall or experienced change in No Accompanied By: friend activities of daily living that may affect Transfer Assistance: None risk of falls: Patient Identification Verified: Yes Signs or symptoms of abuse/neglect since last visito No Secondary Verification Process Completed: Yes Hospitalized since last visit: No Patient Requires Transmission-Based Precautions: No Implantable device outside of the clinic excluding No Patient Has Alerts: Yes cellular tissue based products placed in the center Patient Alerts: L ABI: 1.06; R ABI: 1.01 since last visit: Has Dressing in Place as Prescribed: Yes Pain Present Now: Yes Electronic Signature(s) Signed: 08/01/2020 2:09:33 PM By: Karl Itoawkins, Destiny Entered By: Karl Itoawkins, Destiny on 07/28/2020 14:01:54 -------------------------------------------------------------------------------- Compression Therapy Details Patient Name: Date [name] RGE[place]ITE W. 07/28/2020 1:45 PM Medical Record Number: [id number] Patient Account Number: [id number] Date of Birth/Sex: Treating RN: 06/30/1940 (80 y.o. Jamie CluckF) Barnhart, Jodi Primary Care Alyana Kreiter: Hiram GashEGGLESTO [name]-CLA [name], Jamie QuarryV A LENICA Other Clinician: Referring Eliazer Hemphill: Treating Eshal Propps/Extender: Olive BassHoffman, Jessica EGGLESTO [name]-CLA [name], V A LENICA Weeks in  Treatment: 4 Compression Therapy Performed for Wound Assessment: Wound #3 Left,Distal,Medial Lower Leg Performed By: Clinician Antonieta IbaBarnhart, Jodi, RN Compression Type: Three Layer Post Procedure Diagnosis Same as Pre-procedure Electronic Signature(s) Signed: 07/28/2020 5:01:44 PM By: Antonieta IbaBarnhart, Jodi Entered By: Antonieta IbaBarnhart, Jodi on 07/28/2020 15:18:52 -------------------------------------------------------------------------------- Compression Therapy Details Patient Name: Date of Service: Jamie BrinkWILSO [name], Jamie SleeperMA RGEURITE W. 07/28/2020 1:45 PM Medical Record Number: [id number] Patient Account Number: [id number] Date of Birth/Sex: Treating RN: 11/17/1940 (80 y.o. Jamie CluckF) Barnhart, Jodi Primary Care Tova Vater: Hiram GashEGGLESTO [name]-CLA [name], Jamie QuarryV A LENICA Other Clinician: Referring Tanaysia Bhardwaj: Treating Lexander Tremblay/Extender: Olive BassHoffman, Jessica EGGLESTO [name]-CLA [name], V A LENICA Weeks in Treatment: 4 Compression Therapy Performed for Wound Assessment: Wound #4 Left,Posterior Lower Leg Performed By: Clinician Antonieta IbaBarnhart, Jodi, RN Compression Type: Three Layer Post Procedure Diagnosis Same as Pre-procedure Electronic Signature(s) Signed: 07/28/2020 5:01:44 PM By: Antonieta IbaBarnhart, Jodi Entered By: Antonieta IbaBarnhart, Jodi on 07/28/2020 15:18:52 -------------------------------------------------------------------------------- Encounter Discharge Information Details Patient Name: Date of Service: Jamie BrinkWILSO [name], Jamie DittoMA RGEURITE W. 07/28/2020 1:45 PM Medical Record Number: [id number] Patient Account Number: [id number] Date of Birth/Sex: Treating RN: 06/07/1940 (80 y.o. Jamie SilenceF) Deaton, Bobbi Primary Care Christyann Manolis: Hiram GashEGGLESTO [name]-CLA [name], Jamie QuarryV A LENICA Other Clinician: Referring Derran Sear: Treating Dala Breault/Extender: Olive BassHoffman, Jessica EGGLESTO [name]-CLA [name], V A LENICA Weeks in Treatment: 4 Encounter Discharge Information Items Post Procedure Vitals Discharge Condition: Stable Temperature (F): 98 Ambulatory Status: Wheelchair Pulse (bpm): 83 Discharge Destination: Home Respiratory Rate  (breaths/min): 16 Transportation: Private Auto Blood Pressure (mmHg): 153/71 Accompanied By: daughter Schedule Follow-up Appointment: Yes Clinical Summary of Care: Electronic Signature(s) Signed: 07/28/2020 5:09:10 PM By: Shawn Stalleaton, Bobbi Entered By: Shawn Stalleaton, Bobbi on 07/28/2020 16:58:49 -------------------------------------------------------------------------------- Lower Extremity Assessment Details Patient Name: Date of Service: Jamie BrinkWILSO [name], [place] RGE[place] W. 07/28/2020 1:45 PM Medical Record Number: [id number] Patient Account Number: [id number] Date of Birth/Sex: Treating RN: 11/01/1940 (80 y.o. Tommye StandardF) Boehlein, Linda Primary Care Margarette Vannatter: Harlow MaresEGGLESTO [name]-CLA [name], Jamie QuarryV A LENICA Other Clinician: Referring Trejan Buda: Treating Adja Ruff/Extender: Olive BassHoffman, Jessica EGGLESTO [name]-CLA  [name], V A LENICA Weeks in Treatment: 4 Edema Assessment Assessed: [Left: No] [Right: No] Edema: [Left: Ye] [Right: s] Calf Left: Right: Point of Measurement: 29 cm From Medial Instep 38 cm Ankle Left: Right: Point of Measurement: 11 cm From Medial Instep 25.5 cm Vascular Assessment Pulses: Dorsalis Pedis Palpable: [Left:Yes] Electronic Signature(s) Signed: 07/28/2020 5:10:31 PM By: Zenaida Deed RN, BSN Entered By: Zenaida Deed on 07/28/2020 14:32:44 -------------------------------------------------------------------------------- Multi Wound Chart Details Patient Name: Date of Service: Jamie [name], Jamie [name] W. 07/28/2020 1:45 PM Medical Record Number: [id number] Patient Account Number: [id number] Date of Birth/Sex: Treating RN: 17-May-1940 (80 y.o. 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 Etiology: Sleep Apnea, Hypertension, Peripheral Sleep Apnea, Hypertension, Peripheral [name] Comorbid History: Venous Disease, Osteoarthritis, Venous Disease, Osteoarthritis, Neuropathy Neuropathy 04/09/2019 04/09/2019 [name] Date Acquired: 4 4 [name] Weeks of Treatment: Open Open [name] Wound Status: 10x4x0.2 2.6x2x0.2 [name] Measurements L x W x D (cm) 31.416 4.084 [name] A (cm) : rea 6.283 0.817 [name] Volume (cm) : -343.50% -420.30% [name] % Reduction in Area: -343.40% -420.40% [name] % Reduction in Volume: Full Thickness Without Exposed Full Thickness Without Exposed [name] Classification: Support Structures Support Structures Large Medium [name] Exudate Amount: Serosanguineous Serosanguineous [name] Exudate Type: red, brown red, brown [name] Exudate Color: Yes Yes [name] Foul Odor A Cleansing: fter No No [name] Odor Anticipated Due to Product Use: Flat and Intact Distinct, outline attached [name] Wound Margin: Large (67-100%) Large (67-100%) [name] Granulation Amount: Red Red [name] Granulation Quality: Small (1-33%) Small (1-33%) [name] Necrotic Amount: Fat Layer (Subcutaneous Tissue): Yes Fat Layer (Subcutaneous Tissue): Yes [name] Exposed Structures: Fascia: No Fascia: No Tendon: No Tendon: No Muscle: No Muscle: No Joint: No Joint: No Bone: No Bone: No Small (1-33%) None [name] Epithelialization: Chemical/Enzymatic/Mechanical Chemical/Enzymatic/Mechanical [name] Debridement: Pre-procedure Verification/Time Out 15:15 15:15 [name] Taken: Other(Gauze) Other(Gauze) [name] Instrument: None None [name] Bleeding: Procedure was tolerated well Procedure was tolerated well [name] Debridement Treatment Response: 10x4x0.2 2.6x2x0.2 [name] Post Debridement Measurements L x W x D (cm) 6.283 0.817 [name] Post Debridement Volume:  (cm) Compression Therapy Compression Therapy [name] Procedures Performed: Debridement Debridement Treatment Notes Electronic Signature(s) Signed: 07/28/2020 4:48:05 PM By: Geralyn Corwin DO Signed: 07/28/2020 5:01:44 PM By: [name] Entered By: Geralyn Corwin on 07/28/2020 16:34:32 -------------------------------------------------------------------------------- Multi-Disciplinary Care Plan Details Patient Name: Date of Service: Jamie [name], Jamie [name] W. 07/28/2020 1:45 PM Medical Record Number: [id number] Patient Account Number: [id number] Date of Birth/Sex: Treating RN: November 15, 1940 (80 y.o. Jamie [name] Primary Care Jas Betten: Hiram Gash [name], Jamie [name] Other Clinician: Referring Shley Dolby: Treating Seaira Byus/Extender: Olive Bass [name]-CLA [name], V A LENICA Weeks in Treatment: 4 Active Inactive Venous Leg Ulcer Nursing Diagnoses: Actual venous Insuffiency (use after diagnosis is confirmed) Goals: Patient will maintain optimal edema control Date Initiated: 06/30/2020 Target Resolution Date: 08/04/2020 Goal Status: Active Interventions: Assess peripheral edema status every visit. Compression as ordered Notes: Wound/Skin Impairment Nursing Diagnoses: Impaired tissue integrity Goals: Patient/caregiver will verbalize understanding of skin care regimen Date Initiated: 06/30/2020 Target Resolution Date: 08/04/2020 Goal Status: Active Ulcer/skin breakdown will have a volume reduction of 30%  by week 4 Date Initiated: 06/30/2020 Target Resolution Date: 08/04/2020 Goal Status: Active Interventions: Assess patient/caregiver ability to obtain necessary supplies Assess patient/caregiver ability to perform ulcer/skin care regimen upon admission and as needed Assess ulceration(s) every visit Provide education on ulcer and skin care Treatment Activities: Skin care regimen initiated : 06/30/2020 Topical wound management initiated : 06/30/2020 Notes: Electronic  Signature(s) Signed: 07/28/2020 2:12:57 PM By: [name] Entered By: [name] on 07/28/2020 14:12:56 -------------------------------------------------------------------------------- Pain Assessment Details Patient Name: Date of Service: [place]. 07/28/2020 1:45 PM Medical Record Number: [id number] Patient Account Number: [id number] Date of Birth/Sex: Treating RN: 1940-09-23 (80 y.o. Jamie [name] Primary Care Gretel Cantu: Hiram Gash [name], Jamie [name] Other Clinician: Referring Neela Zecca: Treating Taressa Rauh/Extender: Olive Bass [name]-CLA [name], V A LENICA Weeks in Treatment: 4 Active Problems Location of Pain Severity and Description of Pain Patient Has Paino Yes Site Locations Rate the pain. Current Pain Level: 10 Pain Management and Medication Current Pain Management: Electronic Signature(s) Signed: 07/28/2020 5:01:44 PM By: [name] Signed: 08/01/2020 2:09:33 PM By: Karl Ito Entered By: Karl Ito on 07/28/2020 14:02:37 -------------------------------------------------------------------------------- Patient/Caregiver Education Details Patient Name: Date of Service: [place] 4/22/2022andnbsp1:45 PM Medical Record Number: [id number] Patient Account Number: [id number] Date of Birth/Gender: Treating RN: [birth year] (80 y.o. Jamie [name] Primary Care Physician: Hiram Gash [name], Jamie [name] Other Clinician: Referring Physician: Treating Physician/Extender: Olive Bass [name]-CLA [name], V A LENICA Weeks in Treatment: 4 Education Assessment Education Provided To: Patient Education Topics Provided Venous: Methods: Explain/Verbal, Printed Responses: State content correctly Wound/Skin Impairment: Methods: Demonstration, Explain/Verbal, Printed Responses: State content correctly Electronic Signature(s) Signed: 07/28/2020 5:01:44 PM By: [name] Entered By: [name] on 07/28/2020  15:23:54 -------------------------------------------------------------------------------- Wound Assessment Details Patient Name: Date of Service: [place]. 07/28/2020 1:45 PM Medical Record Number: [id number] Patient Account Number: [id number] Date of Birth/Sex: Treating RN: [birth year] (80 y.o. Jamie [name] Primary Care Beautiful Pensyl: Hiram Gash [name], Jamie [name] Other Clinician: Referring Briggette Najarian: Treating Lindon Kiel/Extender: Olive Bass [name]-CLA [name], V A LENICA Weeks in Treatment: 4 Wound Status Wound Number: 3 Primary Venous Leg Ulcer Etiology: Wound Location: Left, Distal, Medial Lower Leg Wound Open Wounding Event: Gradually Appeared Status: Date Acquired: 04/09/2019 Comorbid Sleep Apnea, Hypertension, Peripheral Venous Disease, Weeks Of Treatment: 4 History: Osteoarthritis, Neuropathy Clustered Wound: No Photos Wound Measurements Length: (cm) 10 Width: (cm) 4 Depth: (cm) 0.2 Area: (cm) 31.416 Volume: (cm) 6.283 % Reduction in Area: -343.5% % Reduction in Volume: -343.4% Epithelialization: Small (1-33%) Tunneling: No Undermining: No Wound Description Classification: Full Thickness Without Exposed Support Structures Wound Margin: Flat and Intact Exudate Amount: Large Exudate Type: Serosanguineous Exudate Color: red, brown Foul Odor After Cleansing: Yes Due to Product Use: No Slough/Fibrino Yes Wound Bed Granulation Amount: Large (67-100%) Exposed Structure Granulation Quality: Red Fascia Exposed: No Necrotic Amount: Small (1-33%) Fat Layer (Subcutaneous Tissue) Exposed: Yes Necrotic Quality: Adherent Slough Tendon Exposed: No Muscle Exposed: No Joint Exposed: No Bone Exposed: No Treatment Notes Wound #3 (Lower Leg) Wound Laterality: Left, Medial, Distal Cleanser Soap and Water Discharge Instruction: May shower and wash wound with dial antibacterial soap and water prior to dressing change. Wound Cleanser Discharge Instruction:  Cleanse the wound with wound cleanser prior to applying a clean dressing using gauze sponges, not tissue or cotton balls. Peri-Wound Care Triamcinolone 15 (g) Discharge Instruction: In clinic only. Apply to red, irritated periwound Sween Lotion (Moisturizing lotion) Discharge Instruction: apply at home. Apply moisturizing  lotion as directed Topical Keystone Antibiotic Compound Discharge Instruction: Apply per instructions-In office only Primary Dressing Cutimed Sorbact Swab Discharge Instruction: Apply to wound Secondary Dressing Woven Gauze Sponge, Non-Sterile 4x4 in Discharge Instruction: Apply over primary dressing as directed. ABD Pad, 8x10 Discharge Instruction: Apply over primary dressing as directed. CarboFLEX Odor Control Dressing, 4x4 in Discharge Instruction: Apply over primary dressing in office only Secured With Compression Wrap ThreePress (3 layer compression wrap) Discharge Instruction: Apply three layer compression as directed. Compression Stockings Add-Ons Electronic Signature(s) Signed: 07/28/2020 5:01:44 PM By: [name] Signed: 08/01/2020 2:09:33 PM By: Karl Ito Entered By: Karl Ito on 07/28/2020 16:12:23 -------------------------------------------------------------------------------- Wound Assessment Details Patient Name: Date of Service: Jamie [name], Jamie [name]. 07/28/2020 1:45 PM Medical Record Number: [id number] Patient Account Number: [id number] Date of Birth/Sex: Treating RN: 1940-06-24 (80 y.o. Jamie [name] Primary Care Ndea Kilroy: Hiram Gash [name], Jamie [name] Other Clinician: Referring Airica Schwartzkopf: Treating Mathilda Maguire/Extender: Olive Bass [name]-CLA [name], V A LENICA Weeks in Treatment: 4 Wound Status Wound Number: 4 Primary Venous Leg Ulcer Etiology: Wound Location: Left, Posterior Lower Leg Wound Open Wounding Event: Gradually Appeared Status: Date Acquired: 04/09/2019 Comorbid Sleep Apnea, Hypertension, Peripheral  Venous Disease, Weeks Of Treatment: 4 History: Osteoarthritis, Neuropathy Clustered Wound: No Photos Wound Measurements Length: (cm) 2.6 Width: (cm) 2 Depth: (cm) 0.2 Area: (cm) 4.084 Volume: (cm) 0.817 % Reduction in Area: -420.3% % Reduction in Volume: -420.4% Epithelialization: None Tunneling: No Undermining: No Wound Description Classification: Full Thickness Without Exposed Support Structures Wound Margin: Distinct, outline attached Exudate Amount: Medium Exudate Type: Serosanguineous Exudate Color: red, brown Foul Odor After Cleansing: Yes Due to Product Use: No Slough/Fibrino Yes Wound Bed Granulation Amount: Large (67-100%) Exposed Structure Granulation Quality: Red Fascia Exposed: No Necrotic Amount: Small (1-33%) Fat Layer (Subcutaneous Tissue) Exposed: Yes Necrotic Quality: Adherent Slough Tendon Exposed: No Muscle Exposed: No Joint Exposed: No Bone Exposed: No Treatment Notes Wound #4 (Lower Leg) Wound Laterality: Left, Posterior Cleanser Soap and Water Discharge Instruction: May shower and wash wound with dial antibacterial soap and water prior to dressing change. Wound Cleanser Discharge Instruction: Cleanse the wound with wound cleanser prior to applying a clean dressing using gauze sponges, not tissue or cotton balls. Peri-Wound Care Triamcinolone 15 (g) Discharge Instruction: In clinic only. Apply to red, irritated periwound Sween Lotion (Moisturizing lotion) Discharge Instruction: apply at home. Apply moisturizing lotion as directed Topical Keystone Antibiotic Compound Primary Dressing Cutimed Sorbact Swab Discharge Instruction: Apply to wound keystone antibiotics Discharge Instruction: patient to purchase the topical Keystone Antibiotics when the company calls. Home Health to mix and apply under the cutimed sorbact swab with dressing changes. patient to bring topical antibiotics into clinic. Secondary Dressing Woven Gauze Sponge,  Non-Sterile 4x4 in Discharge Instruction: Apply over primary dressing as directed. ABD Pad, 8x10 Discharge Instruction: Apply over primary dressing as directed. CarboFLEX Odor Control Dressing, 4x4 in Discharge Instruction: Apply over primary dressing in office only Secured With Compression Wrap ThreePress (3 layer compression wrap) Discharge Instruction: Apply three layer compression as directed. Compression Stockings Add-Ons Electronic Signature(s) Signed: 07/28/2020 5:01:44 PM By: [name] Signed: 08/01/2020 2:09:33 PM By: Karl Ito Entered By: Karl Ito on 07/28/2020 16:12:41 -------------------------------------------------------------------------------- Vitals Details Patient Name: Date of Service: Jamie Brink, Jamie RGEURITE W. 07/28/2020 1:45 PM Medical Record Number: [id number] Patient Account Number: [id number] Date of Birth/Sex: Treating RN: [birth year] (80 y.o. Jamie [name] Primary Care Jivan Symanski: Hiram Gash [name], Jamie [name] Other Clinician: Referring Ariany Kesselman: Treating Kaycee Haycraft/Extender: Olive Bass [name]-CLA  [name], V A LENICA Weeks in Treatment: 4 Vital Signs Time Taken: 14:01 Temperature (F): 98.3 Height (in): 63 Pulse (bpm): 83 Weight (lbs): 150 Respiratory Rate (breaths/min): 16 Body Mass Index (BMI): 26.6 Blood Pressure (mmHg): 153/71 Reference Range: 80 - 120 mg / dl Electronic Signature(s) Signed: 08/01/2020 2:09:33 PM By: Karl Ito Entered By: Karl Ito on 07/28/2020 14:02:10

## 2020-08-03 ENCOUNTER — Encounter (HOSPITAL_BASED_OUTPATIENT_CLINIC_OR_DEPARTMENT_OTHER): Payer: Medicare Other | Admitting: Internal Medicine

## 2020-08-07 ENCOUNTER — Other Ambulatory Visit: Payer: Self-pay

## 2020-08-07 ENCOUNTER — Encounter (HOSPITAL_BASED_OUTPATIENT_CLINIC_OR_DEPARTMENT_OTHER): Payer: Medicare Other | Attending: Internal Medicine | Admitting: Internal Medicine

## 2020-08-07 DIAGNOSIS — I87332 Chronic venous hypertension (idiopathic) with ulcer and inflammation of left lower extremity: Secondary | ICD-10-CM | POA: Diagnosis present

## 2020-08-07 DIAGNOSIS — Z8614 Personal history of Methicillin resistant Staphylococcus aureus infection: Secondary | ICD-10-CM | POA: Diagnosis not present

## 2020-08-07 DIAGNOSIS — L97922 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: Secondary | ICD-10-CM | POA: Diagnosis not present

## 2020-08-07 NOTE — Progress Notes (Signed)
Jamie, [name] ([id number]) Visit Report for 08/07/2020 Arrival Information Details Patient Name: Date of Service: Jamie [name], [name] 08/07/2020 10:45 A M Medical Record Number: [id number] Patient Account Number: [id number] Date of Birth/Sex: Treating RN: 1940/08/11 (80 y.o. Female) Zandra Abts Primary Care Aianna Fahs: Hiram Gash RK, Lorenso Quarry Other Clinician: Referring Nyajah Hyson: Treating Madiha Bambrick/Extender: Diamantina Monks [name]-CLA RK, V A LENICA Weeks in Treatment: 5 Visit Information History Since Last Visit Added or deleted any medications: No Patient Arrived: Wheel Chair Any new allergies or adverse reactions: No Arrival Time: 10:56 Had a fall or experienced change in No Accompanied By: neice activities of daily living that may affect Transfer Assistance: None risk of falls: Patient Identification Verified: Yes Signs or symptoms of abuse/neglect since last visito No Secondary Verification Process Completed: Yes Hospitalized since last visit: No Patient Requires Transmission-Based Precautions: No Implantable device outside of the clinic excluding No Patient Has Alerts: Yes cellular tissue based products placed in the center Patient Alerts: L ABI: 1.06; R ABI: 1.01 since last visit: Has Dressing in Place as Prescribed: Yes Pain Present Now: Yes Electronic Signature(s) Signed: 08/07/2020 5:00:30 PM By: Karl Ito Entered By: Karl Ito on 08/07/2020 10:56:36 -------------------------------------------------------------------------------- Compression Therapy Details Patient Name: Date of Service: Jamie Blood W. 08/07/2020 10:45 A M Medical Record Number: [id number] Patient Account Number: [id number] Date of Birth/Sex: Treating RN: December 02, 1940 (80 y.o. Female) Zandra Abts Primary Care Ingrid Shifrin: Hiram Gash RK, Lorenso Quarry Other Clinician: Referring Diondra Pines: Treating Kitara Hebb/Extender: Diamantina Monks [name]-CLA RK, V A  LENICA Weeks in Treatment: 5 Compression Therapy Performed for Wound Assessment: Wound #3 Left,Distal,Medial Lower Leg Performed By: Clinician Zandra Abts, RN Compression Type: Three Layer Post Procedure Diagnosis Same as Pre-procedure Electronic Signature(s) Signed: 08/07/2020 5:39:21 PM By: Zandra Abts RN, BSN Entered By: Zandra Abts on 08/07/2020 11:25:06 -------------------------------------------------------------------------------- Compression Therapy Details Patient Name: Date of Service: Jamie [name], Jamie Ditto W. 08/07/2020 10:45 A M Medical Record Number: [id number] Patient Account Number: [id number] Date of Birth/Sex: Treating RN: [birth year] (80 y.o. Female) Zandra Abts Primary Care Kikue Gerhart: Hiram Gash RK, Lorenso Quarry Other Clinician: Referring Kendle Turbin: Treating Mekai Wilkinson/Extender: Diamantina Monks [name]-CLA RK, V A LENICA Weeks in Treatment: 5 Compression Therapy Performed for Wound Assessment: Wound #4 Left,Posterior Lower Leg Performed By: Clinician Zandra Abts, RN Compression Type: Three Layer Post Procedure Diagnosis Same as Pre-procedure Electronic Signature(s) Signed: 08/07/2020 5:39:21 PM By: Zandra Abts RN, BSN Entered By: Zandra Abts on 08/07/2020 11:25:06 -------------------------------------------------------------------------------- Encounter Discharge Information Details Patient Name: Date of Service: Jamie [name], Jamie Ditto W. 08/07/2020 10:45 A M Medical Record Number: [id number] Patient Account Number: [id number] Date of Birth/Sex: Treating RN: 10-25-1940 (80 y.o. Female) Fonnie Mu Primary Care Demone Lyles: Hiram Gash RK, Lorenso Quarry Other Clinician: Referring Dyland Panuco: Treating Juvia Aerts/Extender: Diamantina Monks [name]-CLA RK, V A LENICA Weeks in Treatment: 5 Encounter Discharge Information Items Discharge Condition: Stable Ambulatory Status: Wheelchair Discharge Destination: Home Transportation: Private  Auto Accompanied By: family Schedule Follow-up Appointment: Yes Clinical Summary of Care: Patient Declined Electronic Signature(s) Signed: 08/07/2020 5:18:58 PM By: Fonnie Mu RN Entered By: Fonnie Mu on 08/07/2020 11:38:38 -------------------------------------------------------------------------------- Lower Extremity Assessment Details Patient Name: Date of Service: Jamie Blood W. 08/07/2020 10:45 A M Medical Record Number: [id number] Patient Account Number: [id number] Date of Birth/Sex: Treating RN: November 25, 1940 (80 y.o. Female) Zandra Abts Primary Care Taha Dimond: Hiram Gash RK, Lorenso Quarry Other Clinician: Referring Armya Westerhoff: Treating Darry Kelnhofer/Extender: Diamantina Monks [name]-CLA RK, V A LENICA Tania Ade  in Treatment: 5 Edema Assessment Assessed: [Left: No] [Right: No] Edema: [Left: Ye] [Right: s] Calf Left: Right: Point of Measurement: 29 cm From Medial Instep 38 cm Ankle Left: Right: Point of Measurement: 11 cm From Medial Instep 25.5 cm Vascular Assessment Pulses: Dorsalis Pedis Palpable: [Left:Yes] Electronic Signature(s) Signed: 08/07/2020 5:39:21 PM By: Zandra Abts RN, BSN Entered By: Zandra Abts on 08/07/2020 11:12:57 -------------------------------------------------------------------------------- Multi Wound Chart Details Patient Name: Date of Service: Jamie [name], Jamie Ditto W. 08/07/2020 10:45 A M Medical Record Number: [id number] Patient Account Number: [id number] Date of Birth/Sex: Treating RN: [birth year] (80 y.o. Female) Zandra Abts Primary Care Lamarco Gudiel: Hiram Gash RK, Lorenso Quarry Other Clinician: Referring Zissy Hamlett: Treating Delmus Warwick/Extender: Diamantina Monks [name]-CLA RK, V A LENICA Weeks in Treatment: 5 Vital Signs Height(in): 63 Pulse(bpm): 76 Weight(lbs): 150 Blood Pressure(mmHg): 146/82 Body Mass Index(BMI): 27 Temperature(F): 98.1 Respiratory Rate(breaths/min): 16 Photos: [3:No Photos Left, Distal,  Medial Lower Leg] [4:No Photos Left, Posterior Lower Leg] [Jamie [name]:Jamie [name] Jamie [name]] Wound Location: [3:Gradually Appeared] [4:Gradually Appeared] [Jamie [name]:Jamie [name]] Wounding Event: [3:Venous Leg Ulcer] [4:Venous Leg Ulcer] [Jamie [name]:Jamie [name]] Primary Etiology: [3:Sleep Apnea, Hypertension, Peripheral Sleep Apnea, Hypertension, Peripheral Jamie [name]] Comorbid History: [3:Venous Disease, Osteoarthritis, Neuropathy 04/09/2019] [4:Venous Disease, Osteoarthritis, Neuropathy 04/09/2019] [Jamie [name]:Jamie [name]] Date Acquired: [3:5] [4:5] [Jamie [name]:Jamie [name]] Weeks of Treatment: [3:Open] [4:Open] [Jamie [name]:Jamie [name]] Wound Status: [3:10x4x0.2] [4:2.6x2x0.2] [Jamie [name]:Jamie [name]] Measurements L x W x D (cm) [3:31.416] [4:4.084] [Jamie [name]:Jamie [name]] A (cm) : rea [3:6.283] [4:0.817] [Jamie [name]:Jamie [name]] Volume (cm) : [3:-343.50%] [4:-420.30%] [Jamie [name]:Jamie [name]] % Reduction in Area: [3:-343.40%] [4:-420.40%] [Jamie [name]:Jamie [name]] % Reduction in Volume: [3:Full Thickness Without Exposed] [4:Full Thickness Without Exposed] [Jamie [name]:Jamie [name]] Classification: [3:Support Structures Large] [4:Support Structures Medium] [Jamie [name]:Jamie [name]] Exudate Amount: [3:Serosanguineous] [4:Serosanguineous] [Jamie [name]:Jamie [name]] Exudate Type: [3:red, brown] [4:red, brown] [Jamie [name]:Jamie [name]] Exudate Color: [3:Flat and Intact] [4:Distinct, outline attached] [Jamie [name]:Jamie [name]] Wound Margin: [3:Large (67-100%)] [4:Large (67-100%)] [Jamie [name]:Jamie [name]] Granulation Amount: [3:Red] [4:Red] [Jamie [name]:Jamie [name]] Granulation Quality: [3:Small (1-33%)] [4:Small (1-33%)] [Jamie [name]:Jamie [name]] Necrotic Amount: [3:Fat Layer (Subcutaneous Tissue): Yes Fat Layer (Subcutaneous Tissue): Yes Jamie [name]] Exposed Structures: [3:Fascia: No Tendon: No Muscle: No Joint: No Bone: No Small (1-33%)] [4:Fascia: No Tendon: No Muscle: No Joint: No Bone: No None] [Jamie [name]:Jamie [name]] Epithelialization: [3:Compression Therapy] [4:Compression Therapy] [Jamie [name]:Jamie [name]] Procedures Performed: Treatment Notes Wound #3 (Lower Leg) Wound Laterality: Left, Medial, Distal [3:Cleanser Soap and Water Discharge Instruction: May shower and wash wound with dial antibacterial soap and water prior to  dressing change. Wound Cleanser Discharge Instruction: Cleanse the wound with wound cleanser prior to applying a clean dressing  using gauze sponges, not tissue or cotton balls.] Peri-Wound Care Triamcinolone 15 (g) Discharge Instruction: In clinic only. Apply to red, irritated periwound Sween Lotion (Moisturizing lotion) Discharge Instruction: apply at home. Apply moisturizing lotion as directed Topical Keystone Antibiotic Compound Discharge Instruction: Apply directly to wound bed under Sorbact Primary Dressing Cutimed Sorbact Swab Discharge Instruction: Apply to wound over antibiotic spray Secondary Dressing Woven Gauze Sponge, Non-Sterile 4x4 in Discharge Instruction: Apply over primary dressing as directed. ABD Pad, 8x10 Discharge Instruction: Apply over primary dressing as directed. CarboFLEX Odor Control Dressing, 4x4 in Discharge Instruction: Apply over primary dressing in office only Secured With Compression Wrap ThreePress (3 layer compression wrap) Discharge Instruction: Apply three layer compression as directed. Compression Stockings Add-Ons Wound #4 (Lower Leg) Wound Laterality: Left, Posterior Cleanser Soap and Water Discharge Instruction: May shower and wash wound with dial antibacterial soap and water prior to dressing change. Wound Cleanser Discharge Instruction: Cleanse the wound with wound cleanser prior to applying a clean dressing using gauze sponges, not tissue or cotton balls. Peri-Wound Care Triamcinolone 15 (g) Discharge Instruction: In clinic only.  Apply to red, irritated periwound Sween Lotion (Moisturizing lotion) Discharge Instruction: apply at home. Apply moisturizing lotion as directed Topical Keystone Antibiotic Compound Discharge Instruction: Apply directly to wound bed under Sorbact Primary Dressing Cutimed Sorbact Swab Discharge Instruction: Apply to wound over antibiotic spray Secondary Dressing Woven Gauze Sponge, Non-Sterile 4x4  in Discharge Instruction: Apply over primary dressing as directed. ABD Pad, 8x10 Discharge Instruction: Apply over primary dressing as directed. CarboFLEX Odor Control Dressing, 4x4 in Discharge Instruction: Apply over primary dressing in office only Secured With Compression Wrap ThreePress (3 layer compression wrap) Discharge Instruction: Apply three layer compression as directed. Compression Stockings Add-Ons Electronic Signature(s) Signed: 08/07/2020 5:25:01 PM By: Baltazar Najjarobson, Michael MD Signed: 08/07/2020 5:39:21 PM By: Zandra AbtsLynch, Shatara RN, BSN Entered By: Baltazar Najjarobson, Michael on 08/07/2020 12:42:05 -------------------------------------------------------------------------------- Multi-Disciplinary Care Plan Details Patient Name: Date of Service: Jamie BrinkWILSO [name], Jamie VirginiaMA RGEURITE W. 08/07/2020 10:45 A M Medical Record Number: [id number] Patient Account Number: [id number] Date of Birth/Sex: Treating RN: 06/10/1940 (80 y.o. 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 Compression as ordered Notes: Wound/Skin Impairment Nursing Diagnoses: Impaired tissue integrity Goals: Patient/caregiver will verbalize understanding of skin care regimen Date Initiated: 06/30/2020 Target Resolution Date: 09/01/2020 Goal Status: Active Ulcer/skin breakdown will have a volume reduction of 30% by week 4 Date Initiated: 06/30/2020 Date Inactivated: 08/07/2020 Target Resolution Date: 08/04/2020 Goal Status: Unmet Unmet Reason: non viable  surface Interventions: Assess patient/caregiver ability to obtain necessary supplies Assess patient/caregiver ability to perform ulcer/skin care regimen upon admission and as needed Assess ulceration(s) every visit Provide education on ulcer and skin care Treatment Activities: Skin care regimen initiated : 06/30/2020 Topical wound management initiated : 06/30/2020 Notes: Electronic Signature(s) Signed: 08/07/2020 5:39:21 PM By: Zandra AbtsLynch, Shatara RN, BSN Entered By: Zandra AbtsLynch, Shatara on 08/07/2020 11:23:40 -------------------------------------------------------------------------------- Pain Assessment Details Patient Name: Date of Service: Jamie BrinkWILSO [name], Jamie DittoMA RGEURITE W. 08/07/2020 10:45 A M Medical Record Number: [id number] Patient Account Number: [id number] Date of Birth/Sex: Treating RN: 04/08/1940 (80 y.o. Female) Zandra AbtsLynch, Shatara Primary Care Melodie Ashworth: Hiram GashEGGLESTO [name]-CLA RK, Lorenso QuarryV A LENICA Other Clinician: Referring Peachie Barkalow: Treating Milledge Gerding/Extender: Diamantina Monksobson, Michael EGGLESTO [name]-CLA RK, V A LENICA Weeks in Treatment: 5 Active Problems Location of Pain Severity and Description of Pain Patient Has Paino No Site Locations Pain Management and Medication Current Pain Management: Electronic Signature(s) Signed: 08/07/2020 5:00:30 PM By: Karl Itoawkins, Destiny Signed: 08/07/2020 5:39:21 PM By: Zandra AbtsLynch, Shatara RN, BSN Entered By: Karl Itoawkins, Destiny on 08/07/2020 10:57:12 -------------------------------------------------------------------------------- Patient/Caregiver Education Details Patient Name: Date of Service: Jamie CityWILSO [name], Jamie RGEURITE W. 5/2/2022andnbsp10:45 A M Medical Record Number: [id number] Patient Account Number: [id number] Date of Birth/Gender: Treating RN: 09/04/1940 (80 y.o. Female) Zandra AbtsLynch, Shatara Primary Care Physician: Hiram GashEGGLESTO [name]-CLA RK, Lorenso QuarryV A LENICA Other Clinician: Referring Physician: Treating Physician/Extender: Diamantina Monksobson, Michael EGGLESTO [name]-CLA RK, V A LENICA Weeks in Treatment: 5 Education  Assessment Education Provided To: Patient Education Topics Provided Wound/Skin Impairment: Methods: Explain/Verbal Responses: State content correctly Electronic Signature(s) Signed: 08/07/2020 5:39:21 PM By: Zandra AbtsLynch, Shatara RN, BSN Entered By: Zandra AbtsLynch, Shatara on 08/07/2020 11:23:51 -------------------------------------------------------------------------------- Wound Assessment Details Patient Name: Date of Service: Jamie BloodWILSO [name], Jamie RGEURITE W. 08/07/2020 10:45 A M Medical Record Number: [id number] Patient Account Number:  [id number] Date of Birth/Sex: Treating RN: June 11, 1940 (80 y.o. Female) Zandra Abts Primary Care Fumiko Cham: Hiram Gash RK, Lorenso Quarry Other Clinician: Referring Tijuana Scheidegger: Treating Jerame Hedding/Extender: Diamantina Monks [name]-CLA RK, V A LENICA Weeks in Treatment: 5 Wound Status Wound Number: 3 Primary Venous Leg Ulcer Etiology: Wound Location: Left, Distal, Medial Lower Leg Wound Open Wounding Event: Gradually Appeared Status: Date Acquired: 04/09/2019 Comorbid Sleep Apnea, Hypertension, Peripheral Venous Disease, Weeks Of Treatment: 5 History: Osteoarthritis, Neuropathy Clustered Wound: No Photos Wound Measurements Length: (cm) 10 Width: (cm) 4 Depth: (cm) 0.2 Area: (cm) 31.416 Volume: (cm) 6.283 % Reduction in Area: -343.5% % Reduction in Volume: -343.4% Epithelialization: Small (1-33%) Tunneling: No Undermining: No Wound Description Classification: Full Thickness Without Exposed Support Structures Wound Margin: Flat and Intact Exudate Amount: Large Exudate Type: Serosanguineous Exudate Color: red, brown Foul Odor After Cleansing: No Slough/Fibrino Yes Wound Bed Granulation Amount: Large (67-100%) Exposed Structure Granulation Quality: Red Fascia Exposed: No Necrotic Amount: Small (1-33%) Fat Layer (Subcutaneous Tissue) Exposed: Yes Necrotic Quality: Adherent Slough Tendon Exposed: No Muscle Exposed: No Joint Exposed: No Bone Exposed:  No Treatment Notes Wound #3 (Lower Leg) Wound Laterality: Left, Medial, Distal Cleanser Soap and Water Discharge Instruction: May shower and wash wound with dial antibacterial soap and water prior to dressing change. Wound Cleanser Discharge Instruction: Cleanse the wound with wound cleanser prior to applying a clean dressing using gauze sponges, not tissue or cotton balls. Peri-Wound Care Triamcinolone 15 (g) Discharge Instruction: In clinic only. Apply to red, irritated periwound Sween Lotion (Moisturizing lotion) Discharge Instruction: apply at home. Apply moisturizing lotion as directed Topical Keystone Antibiotic Compound Discharge Instruction: Apply directly to wound bed under Sorbact Primary Dressing Cutimed Sorbact Swab Discharge Instruction: Apply to wound over antibiotic spray Secondary Dressing Woven Gauze Sponge, Non-Sterile 4x4 in Discharge Instruction: Apply over primary dressing as directed. ABD Pad, 8x10 Discharge Instruction: Apply over primary dressing as directed. CarboFLEX Odor Control Dressing, 4x4 in Discharge Instruction: Apply over primary dressing in office only Secured With Compression Wrap ThreePress (3 layer compression wrap) Discharge Instruction: Apply three layer compression as directed. Compression Stockings Add-Ons Electronic Signature(s) Signed: 08/07/2020 5:00:30 PM By: Karl Ito Signed: 08/07/2020 5:39:21 PM By: Zandra Abts RN, BSN Entered By: Karl Ito on 08/07/2020 16:46:43 -------------------------------------------------------------------------------- Wound Assessment Details Patient Name: Date of Service: Jamie [name], Jamie Ditto W. 08/07/2020 10:45 A M Medical Record Number: [id number] Patient Account Number: [id number] Date of Birth/Sex: Treating RN: July 12, 1940 (80 y.o. Female) Zandra Abts Primary Care Kacen Mellinger: Hiram Gash RK, Lorenso Quarry Other Clinician: Referring Kirstin Kugler: Treating Coralyn Roselli/Extender: Diamantina Monks [name]-CLA RK, V A LENICA Weeks in Treatment: 5 Wound Status Wound Number: 4 Primary Venous Leg Ulcer Etiology: Wound Location: Left, Posterior Lower Leg Wound Open Wounding Event: Gradually Appeared Status: Date Acquired: 04/09/2019 Comorbid Sleep Apnea, Hypertension, Peripheral Venous Disease, Weeks Of Treatment: 5 History: Osteoarthritis, Neuropathy Clustered Wound: No Photos Wound Measurements Length: (cm) 2.6 Width: (cm) 2 Depth: (cm) 0.2 Area: (cm) 4.084 Volume: (cm) 0.817 % Reduction in Area: -420.3% % Reduction in Volume: -420.4% Epithelialization: None Tunneling: No Undermining: No Wound Description Classification: Full Thickness Without Exposed Support Structures Wound Margin: Distinct, outline attached Exudate Amount: Medium Exudate Type: Serosanguineous Exudate Color: red, brown Foul Odor After Cleansing: No Slough/Fibrino Yes Wound Bed Granulation Amount: Large (67-100%) Exposed Structure Granulation Quality: Red Fascia Exposed: No Necrotic Amount: Small (1-33%) Fat Layer (Subcutaneous Tissue) Exposed: Yes Necrotic Quality: Adherent Slough Tendon Exposed: No Muscle Exposed: No Joint Exposed: No Bone Exposed:  No Treatment Notes Wound #4 (Lower Leg) Wound Laterality: Left, Posterior Cleanser Soap and Water Discharge Instruction: May shower and wash wound with dial antibacterial soap and water prior to dressing change. Wound Cleanser Discharge Instruction: Cleanse the wound with wound cleanser prior to applying a clean dressing using gauze sponges, not tissue or cotton balls. Peri-Wound Care Triamcinolone 15 (g) Discharge Instruction: In clinic only. Apply to red, irritated periwound Sween Lotion (Moisturizing lotion) Discharge Instruction: apply at home. Apply moisturizing lotion as directed Topical Keystone Antibiotic Compound Discharge Instruction: Apply directly to wound bed under Sorbact Primary Dressing Cutimed Sorbact  Swab Discharge Instruction: Apply to wound over antibiotic spray Secondary Dressing Woven Gauze Sponge, Non-Sterile 4x4 in Discharge Instruction: Apply over primary dressing as directed. ABD Pad, 8x10 Discharge Instruction: Apply over primary dressing as directed. CarboFLEX Odor Control Dressing, 4x4 in Discharge Instruction: Apply over primary dressing in office only Secured With Compression Wrap ThreePress (3 layer compression wrap) Discharge Instruction: Apply three layer compression as directed. Compression Stockings Add-Ons Electronic Signature(s) Signed: 08/07/2020 5:00:30 PM By: Karl Ito Signed: 08/07/2020 5:39:21 PM By: Zandra Abts RN, BSN Entered By: Karl Ito on 08/07/2020 16:46:24 -------------------------------------------------------------------------------- Vitals Details Patient Name: Date of Service: Jamie Brink, Jamie RGEURITE W. 08/07/2020 10:45 A M Medical Record Number: [id number] Patient Account Number: [id number] Date of Birth/Sex: Treating RN: 21-Jul-1940 (80 y.o. Female) Zandra Abts Primary Care Ikeisha Blumberg: Hiram Gash RK, Lorenso Quarry Other Clinician: Referring Naydene Kamrowski: Treating Carnella Fryman/Extender: Diamantina Monks [name]-CLA RK, V A LENICA Weeks in Treatment: 5 Vital Signs Time Taken: 10:56 Temperature (F): 98.1 Height (in): 63 Pulse (bpm): 76 Weight (lbs): 150 Respiratory Rate (breaths/min): 16 Body Mass Index (BMI): 26.6 Blood Pressure (mmHg): 146/82 Reference Range: 80 - 120 mg / dl Electronic Signature(s) Signed: 08/07/2020 5:00:30 PM By: Karl Ito Entered By: Karl Ito on 08/07/2020 10:56:55

## 2020-08-07 NOTE — Progress Notes (Signed)
[name], [name] ([id number]) Visit Report for 08/07/2020 HPI Details Patient Name: Date of Service: Jamie [name], [name] 08/07/2020 10:45 A M Medical Record Number: [id number] Patient Account Number: [id number] Date of Birth/Sex: Treating RN: Apr 03, 1941 (80 y.o. Female) Jamie [name] Primary Care Provider: Hiram Gash [name], Jamie [name] Other Clinician: Referring Provider: Treating Provider/Extender: Jamie [name] Jamie [name], Jamie [name] Weeks in Treatment: 5 History of Present Illness HPI Description: ADMISSION 06/30/2020 Jamie [name] is an 80 year old woman who lives in [place]. She is here with her niece for review of wounds on the left medial lower leg and ankle. These have apparently been present for over a year and she followed with Dr. Olegario [name] at the [facility] in [place] for quite a period of time although it looks as though there was a initial consult wound from Dr. Marcha [name] on February 18 presumably there was therefore hiatus. At that point the wounds were described as being there for 3 months. She also tells me she was at the wound care center in [place] for a period of time with this. 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 She had venous studies that were negative for DVT ABIs on the right were 1.01 on the left 1.06. She has . had previous applications of puraply, compression which she does not tolerate very well. She has had several rounds of oral antibiotic therapy. She complains of unrelenting pain and she is seeing Dr. Reece [name] Jamie of pain management apparently was on oxycodone but that did not help. She also had a skin biopsy done by Dr. Olegario [name] although we do not have that result. She does not appear to have an arterial issue. I am not completely clear what she has been putting on the wounds lately. 3/31; this patient has a particularly nasty set of wounds on the left medial ankle in the middle of what looks to be  hemosiderin deposition secondary to chronic venous insufficiency. She has a lot of pain followed by pain management. Dr. Marcha [name] apparently did a biopsy of something on the left leg last fall what I would like to get this result. She has a history of MRSA treatment. I do not believe she had reflux studies but she did have DVT rule out studies. Previous ABIs have not suggested arterial insufficiency 4/7; difficult wounds on the left medial ankle probably chronic venous insufficiency. With considerable effort on behalf of our case manager we were able to finally to speak to somebody at the hospital in [place] who indicated that no biopsy of this area have been done even though the patient describes this in some detail and is even able to point out where she thinks the biopsy was done. We have been using Sorbact. The PCR culture I did showed polymicrobial identification with Pseudomonas, staph aureus, Peptostreptococcus. All of this and low titers. Resistance genes identified were MRSA, staph virulence gene and tetracycline. We are going to send this to [facility] for a topical antibiotic which is something that we have had good success with recently in large venous ulcers with a lot of purulent drainage. There would not be an easy oral alternative here possibly line escalated and ciprofloxacin if we need to use systemic antibiotic 4/15; difficult area on the left medial ankle. Most likely chronic venous insufficiency. I think she will probably need venous reflux study I think she had DVT rule outs but not venous reflux studies. We have not yet obtained  the topical antibiotics. She has home health changing the dressing we have been using Sorbact for adherent fibrinous debris on the surface. Very difficult to remove 4/22; patient presents for 1 week follow-up. She has been using sore back under compression wraps and these are changed 3 times a week with home health. She also had [facility] antibiotics sent to her  house and brought them in today. She has no complaints or issues today. 5/2; patient is here for follow-up. She has been using Sorbact under compression. Very painful wound. She has been using [facility] antibiotics. Not much improvement although the more medial part of the wound has cleaned up nicely and the larger part of the wound about 50% slough covered. Part of the issue here is that she had stays at both [facility] wound care center, [facility] wound care center and now [place]. Not sure if she has had venous reflux studies. As far as we are able to tell she did not have a biopsy. My notes state that she did not have venous reflux studies just DVT rule outs. Electronic Signature(s) Signed: 08/07/2020 5:25:01 PM By: [name] MD Entered By: [name] on 08/07/2020 12:49:58 -------------------------------------------------------------------------------- Physical Exam Details Patient Name: Date of Service: Jamie [name], [name] 08/07/2020 10:45 A M Medical Record Number: [id number] Patient Account Number: [id number] Date of Birth/Sex: Treating RN: 1940/04/22 (80 y.o. Female) Jamie [name] Primary Care Provider: Hiram Gash [name], Jamie [name] Other Clinician: Referring Provider: Treating Provider/Extender: Jamie [name] Jamie [name], Jamie [name] Weeks in Treatment: 5 Constitutional Patient is hypertensive.. Pulse regular and within target range for patient.Marland Kitchen Respirations regular, non-labored and within target range.. Temperature is normal and within the target range for the patient.Marland Kitchen Appears in no distress. Cardiovascular No real suggestion she has arterial issues. Notes Wound examination. A large wound on the distal medial leg. This extends anteriorly. The anterior part is not nearly as healthy as the area on the medial leg. I think these were once to wounds but they seem to have come together. No overt infection around the wound Electronic Signature(s) Signed: 08/07/2020 5:25:01 PM  By: [name] MD Entered By: [name] on 08/07/2020 12:51:19 -------------------------------------------------------------------------------- Physician Orders Details Patient Name: Date of Service: Jamie [name], [name] 08/07/2020 10:45 A M Medical Record Number: [id number] Patient Account Number: [id number] Date of Birth/Sex: Treating RN: Jul 27, 1940 (80 y.o. 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 - Dr. Leanord Hawking Return A Bathing/ Shower/ Hygiene May shower with protection but do not get wound dressing(s) wet. Edema Control - Lymphedema / SCD / Other Elevate legs to the level of the heart or above for 30 minutes daily and/or when sitting, a frequency of: - throughout the day 3-4 times a day. Avoid standing for long periods of time. Exercise regularly Additional Orders / Instructions Follow Nutritious Diet Home Health No change in wound care orders this week; continue Home Health for wound care. May utilize formulary equivalent dressing for wound treatment orders unless otherwise specified. - ***DO NOT USE HYDROGEL SHEET - ONLY USE PLAIN HYDROGEL OR [place] JELLY*** Other Home Health Orders/Instructions: -  [facility] fax #[phone] Wound Treatment Wound #3 - Lower Leg Wound Laterality: Left, Medial, Distal Cleanser: Soap and Water (Home Health) 2 x Per Week/30 Days Discharge Instructions: May shower and wash wound with dial antibacterial soap and water prior to dressing  change. Cleanser: Wound Cleanser (Home Health) 2 x Per Week/30 Days Discharge Instructions: Cleanse the wound with wound cleanser prior to applying a clean dressing using gauze sponges, not tissue or cotton balls. Peri-Wound Care: Triamcinolone 15 (g) 2 x Per Week/30 Days Discharge Instructions: In clinic only. Apply to red, irritated periwound Peri-Wound Care: Sween Lotion (Moisturizing lotion) (Home Health) 2 x Per Week/30 Days Discharge Instructions: apply at home. Apply moisturizing lotion as directed Topical: [facility] Antibiotic Compound (Home Health) 2 x Per Week/30 Days Discharge Instructions: Apply directly to wound bed under Sorbact Prim Dressing: Cutimed Sorbact Swab (Home Health) 2 x Per Week/30 Days ary Discharge Instructions: Apply to wound over antibiotic spray Secondary Dressing: Woven Gauze Sponge, Non-Sterile 4x4 in (Home Health) 2 x Per Week/30 Days Discharge Instructions: Apply over primary dressing as directed. Secondary Dressing: ABD Pad, 8x10 (Home Health) 2 x Per Week/30 Days Discharge Instructions: Apply over primary dressing as directed. Secondary Dressing: CarboFLEX Odor Control Dressing, 4x4 in (Home Health) 2 x Per Week/30 Days Discharge Instructions: Apply over primary dressing in office only Compression Wrap: ThreePress (3 layer compression wrap) 2 x Per Week/30 Days Discharge Instructions: Apply three layer compression as directed. Wound #4 - Lower Leg Wound Laterality: Left, Posterior Cleanser: Soap and Water (Home Health) 2 x Per Week/30 Days Discharge Instructions: May shower and wash wound with dial antibacterial soap and water prior to dressing change. Cleanser: Wound Cleanser (Home Health) 2 x Per Week/30 Days Discharge Instructions: Cleanse the wound with wound cleanser prior to applying a clean dressing using gauze sponges, not tissue or cotton balls. Peri-Wound Care: Triamcinolone 15 (g) 2 x Per Week/30 Days Discharge Instructions: In clinic only.  Apply to red, irritated periwound Peri-Wound Care: Sween Lotion (Moisturizing lotion) (Home Health) 2 x Per Week/30 Days Discharge Instructions: apply at home. Apply moisturizing lotion as directed Topical: [facility] Antibiotic Compound (Home Health) 2 x Per Week/30 Days Discharge Instructions: Apply directly to wound bed under Sorbact Prim Dressing: Cutimed Sorbact Swab (Home Health) 2 x Per Week/30 Days ary Discharge Instructions: Apply to wound over antibiotic spray Secondary Dressing: Woven Gauze Sponge, Non-Sterile 4x4 in (Home Health) 2 x Per Week/30 Days Discharge Instructions: Apply over primary dressing as directed. Secondary Dressing: ABD Pad, 8x10 (Home Health) 2 x Per Week/30 Days Discharge Instructions: Apply over primary dressing as directed. Secondary Dressing: CarboFLEX Odor Control Dressing, 4x4 in (Home Health) 2 x Per Week/30 Days Discharge Instructions: Apply over primary dressing in office only Compression Wrap: ThreePress (3 layer compression wrap) 2 x Per Week/30 Days Discharge Instructions: Apply three layer compression as directed. 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 08/07/2020 10:45 A M Medical Record Number: [id number] Patient Account Number: [id number] Date of Birth/Sex: Treating RN: 11-14-1940 (80 y.o. 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 08/07/2020 10:45 A M Medical Record Number: [id number] Patient Account Number: [id number] Date of Birth/Sex: Treating RN: 09/23/1940 (80 y.o. Female) Jamie AbtsLynch, Shatara Primary Care Provider: Hiram GashEGGLESTO Jamie [name], Jamie QuarryV A [name] Other Clinician: Referring Provider: Treating Provider/Extender: Jamie Monksobson, Connie Lasater EGGLESTO Jamie [name], Jamie [name] Weeks in Treatment: 5 Subjective History of Present Illness (HPI) ADMISSION 06/30/2020 [name] is an 80 year old woman who lives in [place]. She is here with her niece for review of wounds on the left medial lower leg and ankle. These have apparently been present for over a year and she followed with Dr. Olegario MessierKathy at the [facility] in [place] for quite a period of time although it looks as though there was a initial consult wound from Dr. Marcha Soldersathey on February 18 presumably there was therefore hiatus. At that point the wounds were described as being there for 3 months. She also tells me she was at the wound care center in [place] for a period of time with this. 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 She had venous studies that were negative for DVT ABIs on the right were 1.01 on the left 1.06. She has . had previous  applications of puraply, compression which she does not tolerate very well. She has had several rounds of oral antibiotic therapy. She complains of unrelenting pain and she is seeing Dr. Reece AgarEspy Jamie of pain management apparently was on oxycodone but that did not help. She also had a skin biopsy done by Dr. Olegario MessierKathy although we do not have that result. She does not appear to have an arterial issue. I am not completely clear what she has been putting on the wounds lately. 3/31; this patient has a particularly nasty set of wounds on the left medial ankle in the middle of what looks to be hemosiderin deposition secondary to chronic venous insufficiency. She has a lot of pain followed by pain management. Dr. Marcha Soldersathey apparently did a biopsy of something on the left leg last fall what I would like to get this result. She has a history of MRSA treatment. I do not believe she had reflux studies but she did have DVT rule out studies. Previous ABIs have not suggested arterial insufficiency 4/7; difficult wounds on the left medial ankle probably chronic venous insufficiency. With considerable effort on behalf of our case manager we were able to finally to speak to somebody at the hospital in [place] who indicated that no biopsy of this area have been done even though the patient describes this in some detail and is even able to point out where she thinks the biopsy was done. We have been using Sorbact. The PCR culture I did showed polymicrobial identification with Pseudomonas, staph aureus, Peptostreptococcus. All of this and low titers. Resistance genes identified were MRSA, staph virulence gene and tetracycline. [facility] which is something that we have had good  success with recently in large venous ulcers with a lot of purulent drainage. There would not be an easy oral alternative here possibly line escalated and ciprofloxacin if we need to use systemic antibiotic 4/15;  difficult area on the left medial ankle. Most likely chronic venous insufficiency. I think she will probably need venous reflux study I think she had DVT rule outs but not venous reflux studies. We have not yet obtained the topical antibiotics. She has home health changing the dressing we have been using Sorbact for adherent fibrinous debris on the surface. Very difficult to remove 4/22; patient presents for 1 week follow-up. She has been using sore back under compression wraps and these are changed 3 times a week with home health. She also had [facility] antibiotics sent to her house and brought them in today. She has no complaints or issues today. 5/2; patient is here for follow-up. She has been using Sorbact under compression. Very painful wound. She has been using [facility] antibiotics. Not much improvement although the more medial part of the wound has cleaned up nicely and the larger part of the wound about 50% slough covered. Part of the issue here is that she had stays at both [facility] wound care center, [facility] wound care center and now [place]. Not sure if she has had venous reflux studies. As far as we are able to tell she did not have a biopsy. My notes state that she did not have venous reflux studies just DVT rule outs. Objective Constitutional Patient is hypertensive.. Pulse regular and within target range for patient.Marland Kitchen Respirations regular, non-labored and within target range.. Temperature is normal and within the target range for the patient.Marland Kitchen Appears in no distress. Vitals Time Taken: 10:56 AM, Height: 63 in, Weight: 150 lbs, BMI: 26.6, Temperature: 98.1 F, Pulse: 76 bpm, Respiratory Rate: 16 breaths/min, Blood Pressure: 146/82 mmHg. Cardiovascular No real suggestion she has arterial issues. General Notes: Wound examination. A large wound on the distal medial leg. This extends anteriorly. The anterior part is not nearly as healthy as the area on the medial leg. I think these were once  to wounds but they seem to have come together. No overt infection around the wound Integumentary (Hair, Skin) Wound #3 status is Open. Original cause of wound was Gradually Appeared. The date acquired was: 04/09/2019. The wound has been in treatment 5 weeks. The wound is located on the Left,Distal,Medial Lower Leg. The wound measures 10cm length x 4cm width x 0.2cm depth; 31.416cm^2 area and 6.283cm^3 volume. There is Fat Layer (Subcutaneous Tissue) exposed. There is no tunneling or undermining noted. There is a large amount of serosanguineous drainage noted. The wound margin is flat and intact. There is large (67-100%) red granulation within the wound bed. There is a small (1-33%) amount of necrotic tissue within the wound bed including Adherent Slough. Wound #4 status is Open. Original cause of wound was Gradually Appeared. The date acquired was: 04/09/2019. The wound has been in treatment 5 weeks. The wound is located on the Left,Posterior Lower Leg. The wound measures 2.6cm length x 2cm width x 0.2cm depth; 4.084cm^2 area and 0.817cm^3 volume. There is Fat Layer (Subcutaneous Tissue) exposed. There is no tunneling or undermining noted. There is a medium amount of serosanguineous drainage noted. The wound margin is distinct with the outline attached to the wound base. There is large (67-100%) red granulation within the wound bed. There is a small (1-33%) amount of necrotic tissue within the wound bed including Adherent Slough. 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 There was a Three Layer Compression Therapy Procedure by Jamie Abts, RN. Post procedure Diagnosis Wound #3: Same as  Pre-Procedure Wound #4 Pre-procedure diagnosis of Wound #4 is a Venous Leg Ulcer located on the Left,Posterior Lower Leg . There was a Three Layer Compression Therapy Procedure by Jamie Abts, RN. Post procedure Diagnosis Wound #4: Same as Pre-Procedure Plan Follow-up Appointments: Return Appointment in 2 weeks. - Dr. Leanord Hawking Bathing/ Shower/ Hygiene: May shower with protection but do not get wound dressing(s) wet. Edema Control - Lymphedema / SCD / Other: Elevate legs to the level of the heart or above for 30 minutes daily and/or when sitting, a frequency of: - throughout the day 3-4 times a day. Avoid standing for long periods of time. Exercise regularly Additional Orders / Instructions: Follow Nutritious Diet Home Health: No change in wound care orders this week; continue Home Health for wound care. May utilize formulary equivalent dressing for wound treatment orders unless otherwise specified. - ***DO NOT USE HYDROGEL SHEET - ONLY USE PLAIN HYDROGEL OR [place] JELLY*** Other Home Health Orders/Instructions: - [facility] fax #[phone] WOUND #3: - Lower Leg Wound Laterality: Left, Medial, Distal Cleanser: Soap and Water (Home Health) 2 x Per Week/30 Days Discharge Instructions: May shower and wash wound with dial antibacterial soap and water prior to dressing change. Cleanser: Wound Cleanser (Home Health) 2 x Per Week/30 Days Discharge Instructions: Cleanse the wound with wound cleanser prior to applying a clean dressing using gauze sponges, not tissue or cotton balls. Peri-Wound Care: Triamcinolone 15 (g) 2 x Per Week/30 Days Discharge Instructions: In clinic only. Apply to red, irritated periwound Peri-Wound Care: Sween Lotion (Moisturizing lotion) (Home Health) 2 x Per Week/30 Days Discharge Instructions: apply at home. Apply moisturizing lotion as directed Topical: [facility] Antibiotic Compound (Home Health) 2 x Per Week/30 Days Discharge Instructions: Apply  directly to wound bed under Sorbact Prim Dressing: Cutimed Sorbact Swab (Home Health) 2 x Per Week/30 Days ary Discharge Instructions: Apply to wound over antibiotic spray Secondary Dressing: Woven Gauze Sponge, Non-Sterile 4x4 in (Home Health) 2 x Per Week/30 Days Discharge Instructions: Apply over primary dressing as directed. Secondary Dressing: ABD Pad, 8x10 (Home Health) 2 x Per Week/30 Days Discharge Instructions: Apply over primary dressing as directed. Secondary Dressing: CarboFLEX Odor Control Dressing, 4x4 in (Home Health) 2 x Per Week/30 Days Discharge Instructions: Apply over primary dressing in office only Com pression Wrap: ThreePress (3 layer compression wrap) 2 x Per Week/30 Days Discharge Instructions: Apply three layer compression as directed. WOUND #4: - Lower Leg Wound Laterality: Left, Posterior Cleanser: Soap and Water (Home Health) 2 x Per Week/30 Days Discharge Instructions: May shower and wash wound with dial antibacterial soap and water prior to dressing change. Cleanser: Wound Cleanser (Home Health) 2 x Per Week/30 Days Discharge Instructions: Cleanse the wound with wound cleanser prior to applying a clean dressing using gauze sponges, not tissue or cotton balls. Peri-Wound Care: Triamcinolone 15 (g) 2 x Per Week/30 Days Discharge Instructions: In clinic only. Apply to red, irritated periwound Peri-Wound Care: Sween Lotion (Moisturizing lotion) (Home Health) 2 x Per Week/30 Days Discharge Instructions: apply at home. Apply moisturizing lotion as directed Topical: [facility] Antibiotic Compound [facility]) 2  x Per Week/30 Days Discharge Instructions: Apply directly to wound bed under Sorbact Prim Dressing: Cutimed Sorbact Swab (Home Health) 2 x Per Week/30 Days ary Discharge Instructions: Apply to wound over antibiotic spray Secondary Dressing: Woven Gauze Sponge, Non-Sterile 4x4 in (Home Health) 2 x Per Week/30 Days Discharge Instructions: Apply over primary  dressing as directed. Secondary Dressing: ABD Pad, 8x10 (Home Health) 2 x Per Week/30 Days Discharge Instructions: Apply over primary dressing as directed. Secondary Dressing: CarboFLEX Odor Control Dressing, 4x4 in (Home Health) 2 x Per Week/30 Days Discharge Instructions: Apply over primary dressing in office only Com pression Wrap: ThreePress (3 layer compression wrap) 2 x Per Week/30 Days Discharge Instructions: Apply three layer compression as directed. #1 I continued with the Sorbact with no improvement in surface area add marginal improvements in the condition of the wound bed 2. I am going to go ahead and order venous reflux studies. My note states that she did not have these just DVT rule outs 3. She did not have a biopsy and I may consider this next time I see her. 4. Not a very healthy large wound. Very painful. Difficult to do any sort of mechanical debridement Electronic Signature(s) Signed: 08/07/2020 5:25:01 PM By: [name] MD Entered By: [name] on 08/07/2020 12:53:08 -------------------------------------------------------------------------------- SuperBill Details Patient Name: Date of Service: Jamie [name], [name] 08/07/2020 Medical Record Number: [id number] Patient Account Number: [id number] Date of Birth/Sex: Treating RN: 04-30-1940 (80 y.o. 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 Facility Procedures CPT4 Code: [id number] ( Description: Facility Use Only) 29581LT - APPLY MULTLAY COMPRS LWR LT LEG Modifier: Quantity: 1 Physician Procedures :  CPT4 Code Description Modifier [id number] - WC PHYS LEVEL 3 - EST PT ICD-10 Diagnosis Description L97.922 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

## 2020-08-21 ENCOUNTER — Encounter (HOSPITAL_BASED_OUTPATIENT_CLINIC_OR_DEPARTMENT_OTHER): Payer: Medicare Other | Admitting: Internal Medicine

## 2020-08-21 ENCOUNTER — Ambulatory Visit (INDEPENDENT_AMBULATORY_CARE_PROVIDER_SITE_OTHER)
Admission: RE | Admit: 2020-08-21 | Discharge: 2020-08-21 | Disposition: A | Discharge status: Home / Self Care | Payer: Medicare Other | Source: Ambulatory Visit | Attending: Surgery | Admitting: Surgery

## 2020-08-21 ENCOUNTER — Encounter (HOSPITAL_BASED_OUTPATIENT_CLINIC_OR_DEPARTMENT_OTHER): Payer: Self-pay

## 2020-08-21 ENCOUNTER — Other Ambulatory Visit (HOSPITAL_COMMUNITY): Payer: Self-pay | Admitting: Internal Medicine

## 2020-08-21 ENCOUNTER — Other Ambulatory Visit: Payer: Self-pay

## 2020-08-21 ENCOUNTER — Ambulatory Visit (HOSPITAL_COMMUNITY)
Admission: RE | Admit: 2020-08-21 | Discharge: 2020-08-21 | Disposition: A | Discharge status: Home / Self Care | Payer: Medicare Other | Source: Ambulatory Visit | Attending: Internal Medicine | Admitting: Internal Medicine

## 2020-08-21 DIAGNOSIS — L97922 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: Secondary | ICD-10-CM | POA: Diagnosis not present

## 2020-08-21 DIAGNOSIS — I87332 Chronic venous hypertension (idiopathic) with ulcer and inflammation of left lower extremity: Secondary | ICD-10-CM | POA: Diagnosis not present

## 2020-08-22 DIAGNOSIS — I872 Venous insufficiency (chronic) (peripheral): Secondary | ICD-10-CM | POA: Insufficient documentation

## 2020-08-23 NOTE — Progress Notes (Signed)
Jamie [name], Jamie [name] ([id number]) Visit Report for 08/21/2020 Debridement Details Patient Name: Date of Service: Jamie [name], [name] 08/21/2020 10:30 A M Medical Record Number: [id number] Patient Account Number: [id number] Date of Birth/Sex: Treating RN: 1940-06-09 (80 y.o. Female) Zandra Abts Primary Care Provider: Hiram Gash RK, Lorenso Quarry Other Clinician: Referring Provider: Treating Provider/Extender: Diamantina Monks N-CLA RK, V A LENICA Weeks in Treatment: 7 Debridement Performed for Assessment: Wound #3 Left,Distal,Medial Lower Leg Performed By: Physician Maxwell Caul., MD Debridement Type: Debridement Severity of Tissue Pre Debridement: Fat layer exposed Level of Consciousness (Pre-procedure): Awake and Alert Pre-procedure Verification/Time Out Yes - 11:18 Taken: Start Time: 11:18 Pain Control: Other : Benzocaine 20% T Area Debrided (L x W): otal 3 (cm) x 3 (cm) = 9 (cm) Tissue and other material debrided: Viable, Non-Viable, Slough, Subcutaneous, Slough Level: Skin/Subcutaneous Tissue Debridement Description: Excisional Instrument: Curette Bleeding: Minimum Hemostasis Achieved: Pressure End Time: 11:19 Procedural Pain: 5 Post Procedural Pain: 3 Response to Treatment: Procedure was tolerated well Level of Consciousness (Post- Awake and Alert procedure): Post Debridement Measurements of Total Wound Length: (cm) 8.9 Width: (cm) 8 Depth: (cm) 0.2 Volume: (cm) 11.184 Character of Wound/Ulcer Post Debridement: Requires Further Debridement Severity of Tissue Post Debridement: Fat layer exposed Post Procedure Diagnosis Same as Pre-procedure Electronic Signature(s) Signed: 08/22/2020 4:16:43 PM By: [name] MD Signed: 08/23/2020 6:30:22 PM By: Zandra Abts RN, BSN Entered By: [name] on 08/21/2020 11:27:03 -------------------------------------------------------------------------------- HPI Details Patient Name: Date of  Service: Jamie Brink, Jamie RGEURITE W. 08/21/2020 10:30 A M Medical Record Number: [id number] Patient Account Number: [id number] Date of Birth/Sex: Treating RN: [birth year] (80 y.o. Female) Zandra Abts Primary Care Provider: Hiram Gash RK, Lorenso Quarry Other Clinician: Referring Provider: Treating Provider/Extender: Diamantina Monks N-CLA RK, V A LENICA Weeks in Treatment: 7 History of Present Illness HPI Description: ADMISSION 06/30/2020 Mrs. Borak is an 80 year old woman who lives in [place]. She is here with her niece for review of wounds on the left medial lower leg and ankle. These have apparently been present for over a year and she followed with Dr. Olegario Messier at the [facility] in [place] for quite a period of time although it looks as though there was a initial consult wound from Dr. Marcha Solders on February 18 presumably there was therefore hiatus. At that point the wounds were described as being there for 3 months. She also tells me she was at the wound care center in [place] for a period of time with this. 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 She had venous studies that were negative for DVT ABIs on the right were 1.01 on the left 1.06. She has . had previous applications of puraply, compression which she does not tolerate very well. She has had several rounds of oral antibiotic therapy. She complains of unrelenting pain and she is seeing Dr. Reece Agar V of pain management apparently was on oxycodone but that did not help. She also had a skin biopsy done by Dr. Olegario Messier although we do not have that result. She does not appear to have an arterial issue. I am not completely clear what she has been putting on the wounds lately. 3/31; this patient has a particularly nasty set of wounds on the left medial ankle in the middle of what looks to be hemosiderin deposition secondary to chronic venous insufficiency. She has a lot of pain  followed by pain management. Dr. Marcha Solders apparently did a  biopsy of something on the left leg last fall what I would like to get this result. She has a history of MRSA treatment. I do not believe she had reflux studies but she did have DVT rule out studies. Previous ABIs have not suggested arterial insufficiency 4/7; difficult wounds on the left medial ankle probably chronic venous insufficiency. With considerable effort on behalf of our case manager we were able to finally to speak to somebody at the hospital in [place] who indicated that no biopsy of this area have been done even though the patient describes this in some detail and is even able to point out where she thinks the biopsy was done. We have been using Sorbact. The PCR culture I did showed polymicrobial identification with Pseudomonas, staph aureus, Peptostreptococcus. All of this and low titers. Resistance genes identified were MRSA, staph virulence gene and tetracycline. We are going to send this to [facility] for a topical antibiotic which is something that we have had good success with recently in large venous ulcers with a lot of purulent drainage. There would not be an easy oral alternative here possibly line escalated and ciprofloxacin if we need to use systemic antibiotic 4/15; difficult area on the left medial ankle. Most likely chronic venous insufficiency. I think she will probably need venous reflux study I think she had DVT rule outs but not venous reflux studies. We have not yet obtained the topical antibiotics. She has home health changing the dressing we have been using Sorbact for adherent fibrinous debris on the surface. Very difficult to remove 4/22; patient presents for 1 week follow-up. She has been using sore back under compression wraps and these are changed 3 times a week with home health. She also had Keystone antibiotics sent to her house and brought them in today. She has no complaints or issues today. 5/2; patient is  here for follow-up. She has been using Sorbact under compression. Very painful wound. She has been using Keystone antibiotics. Not much improvement although the more medial part of the wound has cleaned up nicely and the larger part of the wound about 50% slough covered. Part of the issue here is that she had stays at both [facility] wound care center, [facility] wound care center and now [place]. Not sure if she has had venous reflux studies. As far as we are able to tell she did not have a biopsy. My notes state that she did not have venous reflux studies just DVT rule outs. 5/16; patient goes for venous reflux studies this afternoon. She says that wound was biopsied which sounds like punch biopsies by Dr. Lynden Ang we do not have these results. We are using Sorbact. Very difficult wound to debride Electronic Signature(s) Signed: 08/22/2020 4:16:43 PM By: [name] MD Entered By: [name] on 08/21/2020 11:34:55 -------------------------------------------------------------------------------- Physical Exam Details Patient Name: Date of Service: Jamie [name], Jamie Ditto W. 08/21/2020 10:30 A M Medical Record Number: [id number] Patient Account Number: [id number] Date of Birth/Sex: Treating RN: [birth year] (80 y.o. Female) Zandra Abts Primary Care Provider: Hiram Gash RK, Lorenso Quarry Other Clinician: Referring Provider: Treating Provider/Extender: Diamantina Monks N-CLA RK, V A LENICA Weeks in Treatment: 7 Constitutional Sitting or standing Blood Pressure is within target range for patient.. Pulse regular and within target range for patient.Marland Kitchen Respirations regular, non-labored and within target range.. Temperature is normal and within the target range for the patient.Marland Kitchen Appears in no distress. Notes Wound exam; large wound on the distal medial left leg. Superior  part is more healthy now in terms of wound surface the inferior part is not I used a #5 curette to debride the inferior part. No  evidence of surrounding infection she has marked changes of chronic venous insufficiency Electronic Signature(s) Signed: 08/22/2020 4:16:43 PM By: [name] MD Entered By: [name] on 08/21/2020 11:35:51 -------------------------------------------------------------------------------- Physician Orders Details Patient Name: Date of Service: Jamie [name], Jamie Ditto W. 08/21/2020 10:30 A M Medical Record Number: [id number] Patient Account Number: [id number] Date of Birth/Sex: Treating RN: 1940/06/29 (80 y.o. 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 Nurse Visit: - This afternoon after Vascular study Bathing/ Shower/ Hygiene May shower with protection but do not get wound dressing(s) wet. Edema Control - Lymphedema / SCD / Other Elevate legs to the level of the heart or above for 30 minutes daily and/or when sitting, a frequency of: - throughout the day 3-4 times a day. Avoid standing for long periods of time. Exercise regularly Additional Orders / Instructions Follow Nutritious Diet Other: - ****Clinic to apply [facility] and Sorbact, ABD and Kerlix in AM today. Apply compression wrap this afternoon after vascular appt. Home Health No change in wound care orders this week; continue Home Health for wound care. May utilize formulary equivalent dressing for wound treatment orders unless otherwise specified. - ***DO NOT USE HYDROGEL SHEET - ONLY USE PLAIN HYDROGEL OR [place] JELLY*** Other Home Health  Orders/Instructions: - [facility] fax #[phone] Wound Treatment Wound #3 - Lower Leg Wound Laterality: Left, Medial, Distal Cleanser: Soap and Water (Home Health) 2 x Per Week/30 Days Discharge Instructions: May shower and wash wound with dial antibacterial soap and water prior to dressing change. Cleanser: Wound Cleanser (Home Health) 2 x Per Week/30 Days Discharge Instructions: Cleanse the wound with wound cleanser prior to applying a clean dressing using gauze sponges, not tissue or cotton balls. Peri-Wound Care: Triamcinolone 15 (g) 2 x Per Week/30 Days Discharge Instructions: In clinic only. Apply to red, irritated periwound Peri-Wound Care: Sween Lotion (Moisturizing lotion) (Home Health) 2 x Per Week/30 Days Discharge Instructions: apply at home. Apply moisturizing lotion as directed Topical: Keystone Antibiotic Compound (Home Health) 2 x Per Week/30 Days Discharge Instructions: Apply directly to wound bed under Sorbact Prim Dressing: Cutimed Sorbact Swab (Home Health) 2 x Per Week/30 Days ary Discharge Instructions: Apply to wound over antibiotic spray Secondary Dressing: Woven Gauze Sponge, Non-Sterile 4x4 in (Home Health) 2 x Per Week/30 Days Discharge Instructions: Apply over primary dressing as directed. Secondary Dressing: ABD Pad, 8x10 (Home Health) 2 x Per Week/30 Days Discharge Instructions: Apply over primary dressing as directed. Secondary Dressing: CarboFLEX Odor Control Dressing, 4x4 in (Home Health) 2 x Per Week/30 Days Discharge Instructions: Apply over primary dressing in office only Compression Wrap: ThreePress (3 layer compression wrap) 2 x Per Week/30 Days Discharge Instructions: Apply three layer compression as directed. Wound #4 - Lower Leg Wound Laterality: Left, Posterior Cleanser: Soap and Water (Home Health) 2 x Per Week/30 Days Discharge Instructions: May shower and wash wound with dial antibacterial soap and water prior to dressing  change. Cleanser: Wound Cleanser (Home  Health) 2 x Per Week/30 Days Discharge Instructions: Cleanse the wound with wound cleanser prior to applying a clean dressing using gauze sponges, not tissue or cotton balls. Peri-Wound Care: Triamcinolone 15 (g) 2 x Per Week/30 Days Discharge Instructions: In clinic only. Apply to red, irritated periwound Peri-Wound Care: Sween Lotion (Moisturizing lotion) (Home Health) 2 x Per Week/30 Days Discharge Instructions: apply at home. Apply moisturizing lotion as directed Topical: Keystone Antibiotic Compound (Home Health) 2 x Per Week/30 Days Discharge Instructions: Apply directly to wound bed under Sorbact Prim Dressing: Cutimed Sorbact Swab (Home Health) 2 x Per Week/30 Days ary Discharge Instructions: Apply to wound over antibiotic spray Secondary Dressing: Woven Gauze Sponge, Non-Sterile 4x4 in (Home Health) 2 x Per Week/30 Days Discharge Instructions: Apply over primary dressing as directed. Secondary Dressing: ABD Pad, 8x10 (Home Health) 2 x Per Week/30 Days Discharge Instructions: Apply over primary dressing as directed. Secondary Dressing: CarboFLEX Odor Control Dressing, 4x4 in (Home Health) 2 x Per Week/30 Days Discharge Instructions: Apply over primary dressing in office only Compression Wrap: ThreePress (3 layer compression wrap) 2 x Per Week/30 Days Discharge Instructions: Apply three layer compression as directed. Electronic Signature(s) Signed: 08/22/2020 4:16:43 PM By: [name] MD Signed: 08/23/2020 6:30:22 PM By: Zandra Abts RN, BSN Entered By: Zandra Abts on 08/21/2020 11:23:24 -------------------------------------------------------------------------------- Problem List Details Patient Name: Date of Service: Jamie [name], Jamie Ditto W. 08/21/2020 10:30 A M Medical Record Number: [id number] Patient Account Number: [id number] Date of Birth/Sex: Treating RN: 07/29/1940 (80 y.o. 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 08/21/2020 10:30 A M Medical Record Number: [id number] Patient Account Number: [id number] Date of Birth/Sex: Treating RN: 1941-03-27 (80 y.o. Female) Zandra Abts Primary Care Provider: Hiram Gash RK, Lorenso Quarry Other Clinician: Referring Provider: Treating Provider/Extender: Diamantina Monks N-CLA RK, V A LENICA Weeks in Treatment: 7 Subjective History of Present Illness (HPI) ADMISSION 06/30/2020 Mrs. Denunzio is an 80 year old woman who lives in [place]. She is here with her niece for review of wounds on the left medial lower leg and ankle. These have apparently been present for over a year and she followed with Dr. Olegario Messier at the [facility] in [place] for quite a period of time although it looks as though there was a initial consult wound from Dr. Marcha Solders on February 18 presumably there was therefore hiatus. At that point the wounds were described as being there for 3 months. She also tells me she was at the wound care center in [place] for a period of time with this. 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 She had venous studies that were  negative for DVT ABIs on the right were 1.01 on the left 1.06. She has . had previous applications of puraply, compression which she does not tolerate very well. She has had several rounds of oral antibiotic therapy. She complains of  unrelenting pain and she is seeing Dr. Reece Agar V of pain management apparently was on oxycodone but that did not help. She also had a skin biopsy done by Dr. Olegario Messier although we do not have that result. She does not appear to have an arterial issue. I am not completely clear what she has been putting on the wounds lately. 3/31; this patient has a particularly nasty set of wounds on the left medial ankle in the middle of what looks to be hemosiderin deposition secondary to chronic venous insufficiency. She has a lot of pain followed by pain management. Dr. Marcha Solders apparently did a biopsy of something on the left leg last fall what I would like to get this result. She has a history of MRSA treatment. I do not believe she had reflux studies but she did have DVT rule out studies. Previous ABIs have not suggested arterial insufficiency 4/7; difficult wounds on the left medial ankle probably chronic venous insufficiency. With considerable effort on behalf of our case manager we were able to finally to speak to somebody at the hospital in [place] who indicated that no biopsy of this area have been done even though the patient describes this in some detail and is even able to point out where she thinks the biopsy was done. We have been using Sorbact. The PCR culture I did showed polymicrobial identification with Pseudomonas, staph aureus, Peptostreptococcus. All of this and low titers. Resistance genes identified were MRSA, staph virulence gene and tetracycline. We are going to send this to [facility] for a topical antibiotic which is something that we have had good success with recently in large venous ulcers with a lot of purulent drainage. There would not be an easy oral alternative  here possibly line escalated and ciprofloxacin if we need to use systemic antibiotic 4/15; difficult area on the left medial ankle. Most likely chronic venous insufficiency. I think she will probably need venous reflux study I think she had DVT rule outs but not venous reflux studies. We have not yet obtained the topical antibiotics. She has home health changing the dressing we have been using Sorbact for adherent fibrinous debris on the surface. Very difficult to remove 4/22; patient presents for 1 week follow-up. She has been using sore back under compression wraps and these are changed 3 times a week with home health. She also had Keystone antibiotics sent to her house and brought them in today. She has no complaints or issues today. 5/2; patient is here for follow-up. She has been using Sorbact under compression. Very painful wound. She has been using Keystone antibiotics. Not much improvement although the more medial part of the wound has cleaned up nicely and the larger part of the wound about 50% slough covered. Part of the issue here is that she had stays at both [facility] wound care center, [facility] wound care center and now [place]. Not sure if she has had venous reflux studies. As far as we are able to tell she did not have a biopsy. My notes state that she did not have venous reflux studies just DVT rule outs. 5/16; patient goes for venous reflux studies this afternoon. She says that wound was biopsied which sounds like punch biopsies by Dr. Lynden Ang we do not have these results. We are using Sorbact. Very difficult wound to debride Objective Constitutional Sitting or standing Blood Pressure is within target range for patient.. Pulse regular and within target range for patient.Marland Kitchen Respirations regular, non-labored and within target range.. Temperature  is normal and within the target range for the patient.Marland Kitchen. Appears in no distress. Vitals Time Taken: 10:49 AM, Height: 63 in, Weight: 150 lbs, BMI:  26.6, Temperature: 97.7 F, Pulse: 74 bpm, Respiratory Rate: 17 breaths/min, Blood Pressure: 118/77 mmHg. General Notes: Wound exam; large wound on the distal medial left leg. Superior part is more healthy now in terms of wound surface the inferior part is not I used a #5 curette to debride the inferior part. No evidence of surrounding infection she has marked changes of chronic venous insufficiency Integumentary (Hair, Skin) Wound #3 status is Open. Original cause of wound was Gradually Appeared. The date acquired was: 04/09/2019. The wound has been in treatment 7 weeks. The wound is located on the Left,Distal,Medial Lower Leg. The wound measures 8.9cm length x 8cm width x 0.2cm depth; 55.92cm^2 area and 11.184cm^3 volume. There is Fat Layer (Subcutaneous Tissue) exposed. There is no tunneling or undermining noted. There is a large amount of serosanguineous drainage noted. The wound margin is flat and intact. There is medium (34-66%) red granulation within the wound bed. There is a medium (34-66%) amount of necrotic tissue within the wound bed including Adherent Slough. Wound #4 status is Open. Original cause of wound was Gradually Appeared. The date acquired was: 04/09/2019. The wound has been in treatment 7 weeks. The wound is located on the Left,Posterior Lower Leg. The wound measures 2.1cm length x 1.9cm width x 0.2cm depth; 3.134cm^2 area and 0.627cm^3 volume. There is Fat Layer (Subcutaneous Tissue) exposed. There is no tunneling or undermining noted. There is a medium amount of serosanguineous drainage noted. The wound margin is distinct with the outline attached to the wound base. There is large (67-100%) red granulation within the wound bed. There is a small (1-33%) amount of necrotic tissue within the wound bed including Adherent Slough. 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 There was a Excisional Skin/Subcutaneous Tissue Debridement with a total area of 9 sq cm performed by Maxwell Caulobson, Donyel Nester G., MD. With the following instrument(s): Curette to remove Viable and Non-Viable tissue/material. Material removed includes Subcutaneous Tissue and Slough and after achieving pain control using Other (Benzocaine 20%). No specimens were taken. A time out was conducted at 11:18, prior to the start of the procedure. A Minimum amount of bleeding was controlled with Pressure. The procedure was tolerated well with a pain level of 5 throughout and a pain level of 3 following the procedure. Post Debridement Measurements: 8.9cm length x 8cm width x 0.2cm depth; 11.184cm^3 volume. Character of Wound/Ulcer Post Debridement requires further debridement. Severity of Tissue Post Debridement is: Fat layer exposed. Post procedure Diagnosis Wound #3: Same as Pre-Procedure Plan Follow-up Appointments: Return Appointment in 1 week. Nurse Visit: - This afternoon after Vascular study Bathing/ Shower/ Hygiene: May shower with protection but do not get wound dressing(s) wet. Edema Control - Lymphedema / SCD / Other: Elevate legs to the level of the heart or above for 30 minutes daily and/or when sitting, a frequency of: - throughout the day 3-4 times a day. Avoid standing for long periods of time. Exercise regularly Additional Orders / Instructions: Follow Nutritious Diet Other: - ****[facility] Sorbact, ABD and Kerlix  in AM today. Apply compression wrap this afternoon after vascular appt. Home Health: No change in wound care orders this week; continue Home Health for wound care. May utilize formulary equivalent dressing for wound  treatment orders unless otherwise specified. - ***DO NOT USE HYDROGEL SHEET - ONLY USE PLAIN HYDROGEL OR [place] JELLY*** Other Home Health Orders/Instructions: - [facility] fax #[phone] WOUND #3: - Lower Leg Wound Laterality: Left, Medial, Distal Cleanser: Soap and Water (Home Health) 2 x Per Week/30 Days Discharge Instructions: May shower and wash wound with dial antibacterial soap and water prior to dressing change. Cleanser: Wound Cleanser (Home Health) 2 x Per Week/30 Days Discharge Instructions: Cleanse the wound with wound cleanser prior to applying a clean dressing using gauze sponges, not tissue or cotton balls. Peri-Wound Care: Triamcinolone 15 (g) 2 x Per Week/30 Days Discharge Instructions: In clinic only. Apply to red, irritated periwound Peri-Wound Care: Sween Lotion (Moisturizing lotion) (Home Health) 2 x Per Week/30 Days Discharge Instructions: apply at home. Apply moisturizing lotion as directed Topical: Keystone Antibiotic Compound (Home Health) 2 x Per Week/30 Days Discharge Instructions: Apply directly to wound bed under Sorbact Prim Dressing: Cutimed Sorbact Swab (Home Health) 2 x Per Week/30 Days ary Discharge Instructions: Apply to wound over antibiotic spray Secondary Dressing: Woven Gauze Sponge, Non-Sterile 4x4 in (Home Health) 2 x Per Week/30 Days Discharge Instructions: Apply over primary dressing as directed. Secondary Dressing: ABD Pad, 8x10 (Home Health) 2 x Per Week/30 Days Discharge Instructions: Apply over primary dressing as directed. Secondary Dressing: CarboFLEX Odor Control Dressing, 4x4 in (Home Health) 2 x Per Week/30 Days Discharge Instructions: Apply over primary dressing in office only Com pression Wrap: ThreePress (3 layer compression wrap) 2 x Per Week/30 Days Discharge Instructions: Apply three layer compression as directed. WOUND #4: - Lower Leg Wound Laterality: Left, Posterior Cleanser: Soap and Water (Home Health) 2 x Per  Week/30 Days Discharge Instructions: May shower and wash wound with dial antibacterial soap and water prior to dressing change. Cleanser: Wound Cleanser (Home Health) 2 x Per Week/30 Days Discharge Instructions: Cleanse the wound with wound cleanser prior to applying a clean dressing using gauze sponges, not tissue or cotton balls. Peri-Wound Care: Triamcinolone 15 (g) 2 x Per Week/30 Days Discharge Instructions: In clinic only. Apply to red, irritated periwound Peri-Wound Care: Sween Lotion (Moisturizing lotion) (Home Health) 2 x Per Week/30 Days Discharge Instructions: apply at home. Apply moisturizing lotion as directed Topical: Keystone Antibiotic Compound (Home Health) 2 x Per Week/30 Days Discharge Instructions: Apply directly to wound bed under Sorbact Prim Dressing: Cutimed Sorbact Swab (Home Health) 2 x Per Week/30 Days ary Discharge Instructions: Apply to wound over antibiotic spray Secondary Dressing: Woven Gauze Sponge, Non-Sterile 4x4 in (Home Health) 2 x Per Week/30 Days Discharge Instructions: Apply over primary dressing as directed. Secondary Dressing: ABD Pad, 8x10 (Home Health) 2 x Per Week/30 Days Discharge Instructions: Apply over primary dressing as directed. Secondary Dressing: CarboFLEX Odor Control Dressing, 4x4 in (Home Health) 2 x Per Week/30 Days Discharge Instructions: Apply over primary dressing in office only Com pression Wrap: ThreePress (3 layer compression wrap) 2 x Per Week/30 Days Discharge Instructions: Apply three layer compression as directed. 1. Continue with Sorbac 2. ABDs still under 3 layer compression HOWEVER we are going to forego wrapping her leg if she is going over for her reflux studies 3. Again she reminds me that she already had biopsies done of this wound in [place] although we were not able to really  put her hands on these. She makes it sounds as though these were punch biopsies although when we contacted the hospital they did not have the  result of these or even record of them being done. I may look at biopsying these again after we look at the reflux Electronic Signature(s) Signed: 08/22/2020 4:16:43 PM By: [name] MD Entered By: [name] on 08/21/2020 11:37:40 -------------------------------------------------------------------------------- SuperBill Details Patient Name: Date of Service: Jamie Brink, Jamie RGEURITE W. 08/21/2020 Medical Record Number: [id number] Patient Account Number: [id number] Date of Birth/Sex: Treating RN: 06/06/1940 (80 y.o. 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

## 2020-08-23 NOTE — Progress Notes (Signed)
JELENE, ALBANO ([id number]) Visit Report for 08/21/2020 Arrival Information Details Patient Name: Date of Service: Jamie [name], Jamie [name]. 08/21/2020 10:30 A M Medical Record Number: [id number] Patient Account Number: [id number] Date of Birth/Sex: Treating RN: [birth year] (80 y.o. Female) Fonnie Mu Primary Care Keryn Nessler: Hiram Gash RK, Lorenso Quarry Other Clinician: Referring Kyler Lerette: Treating Niklas Chretien/Extender: Diamantina Monks N-CLA RK, V A LENICA Weeks in Treatment: 7 Visit Information History Since Last Visit Added or deleted any medications: No Patient Arrived: Wheel Chair Any new allergies or adverse reactions: No Arrival Time: 10:48 Had a fall or experienced change in No Accompanied By: cg activities of daily living that may affect Transfer Assistance: None risk of falls: Patient Identification Verified: Yes Signs or symptoms of abuse/neglect since last visito No Secondary Verification Process Completed: Yes Hospitalized since last visit: No Patient Requires Transmission-Based Precautions: No Implantable device outside of the clinic excluding No Patient Has Alerts: Yes cellular tissue based products placed in the center Patient Alerts: L ABI: 1.06; R ABI: 1.01 since last visit: Has Dressing in Place as Prescribed: Yes Pain Present Now: Yes Electronic Signature(s) Signed: 08/21/2020 6:08:57 PM By: Fonnie Mu RN Entered By: Fonnie Mu on 08/21/2020 10:49:05 -------------------------------------------------------------------------------- Encounter Discharge Information Details Patient Name: Date of Service: Jamie [name], Jamie Ditto W. 08/21/2020 10:30 A M Medical Record Number: [id number] Patient Account Number: [id number] Date of Birth/Sex: Treating RN: 27-Aug-1940 (80 y.o. Female) Zandra Abts Primary Care Hagop Mccollam: Hiram Gash RK, Lorenso Quarry Other Clinician: Referring Sussie Minor: Treating Kapil Petropoulos/Extender: Diamantina Monks N-CLA  RK, V A LENICA Weeks in Treatment: 7 Encounter Discharge Information Items Post Procedure Vitals Discharge Condition: Stable Temperature (F): 97.7 Ambulatory Status: Wheelchair Pulse (bpm): 74 Discharge Destination: Home Respiratory Rate (breaths/min): 17 Transportation: Private Auto Blood Pressure (mmHg): 118/77 Accompanied By: niece Schedule Follow-up Appointment: Yes Clinical Summary of Care: Patient Declined Electronic Signature(s) Signed: 08/23/2020 6:30:22 PM By: Zandra Abts RN, BSN Entered By: Zandra Abts on 08/21/2020 13:17:48 -------------------------------------------------------------------------------- Lower Extremity Assessment Details Patient Name: Date of Service: Jamie [name], Jamie Ditto W. 08/21/2020 10:30 A M Medical Record Number: [id number] Patient Account Number: [id number] Date of Birth/Sex: Treating RN: 12-05-1940 (80 y.o. Female) Fonnie Mu Primary Care Joeleen Wortley: Hiram Gash RK, Lorenso Quarry Other Clinician: Referring Kelvyn Schunk: Treating Malayasia Mirkin/Extender: Diamantina Monks N-CLA RK, V A LENICA Weeks in Treatment: 7 Edema Assessment Assessed: [Left: Yes] [Right: No] Edema: [Left: Ye] [Right: s] Calf Left: Right: Point of Measurement: 29 cm From Medial Instep 34.5 cm Ankle Left: Right: Point of Measurement: 11 cm From Medial Instep 24.5 cm Vascular Assessment Pulses: Dorsalis Pedis Palpable: [Left:Yes] Posterior Tibial Palpable: [Left:Yes] Electronic Signature(s) Signed: 08/21/2020 6:08:57 PM By: Fonnie Mu RN Entered By: Fonnie Mu on 08/21/2020 10:59:34 -------------------------------------------------------------------------------- Multi Wound Chart Details Patient Name: Date of Service: Jamie [name], Jamie Ditto W. 08/21/2020 10:30 A M Medical Record Number: [id number] Patient Account Number: [id number] Date of Birth/Sex: Treating RN: 02-01-1941 (80 y.o. Female) Zandra Abts Primary Care Andrw Mcguirt: Hiram Gash  RK, Lorenso Quarry Other Clinician: Referring Dario Yono: Treating Sherril Heyward/Extender: Diamantina Monks N-CLA RK, V A LENICA Weeks in Treatment: 7 Vital Signs Height(in): 63 Pulse(bpm): 74 Weight(lbs): 150 Blood Pressure(mmHg): 118/77 Body Mass Index(BMI): 27 Temperature(F): 97.7 Respiratory Rate(breaths/min): 17 Photos: [3:No Photos Left, Distal, Medial Lower Leg] [4:No Photos Left, Posterior Lower Leg] [N/A:N/A N/A] Wound Location: [3:Gradually Appeared] [4:Gradually Appeared] [N/A:N/A] Wounding Event: [3:Venous Leg Ulcer] [4:Venous Leg Ulcer] [N/A:N/A] Primary Etiology: [3:Sleep Apnea, Hypertension, Peripheral] [4:Sleep Apnea, Hypertension, Peripheral] [N/A:N/A] Comorbid History: [  3:Venous Disease, Osteoarthritis, Neuropathy 04/09/2019] [4:Venous Disease, Osteoarthritis, Neuropathy 04/09/2019] [N/A:N/A] Date Acquired: [3:7] [4:7] [N/A:N/A] Weeks of Treatment: [3:Open] [4:Open] [N/A:N/A] Wound Status: [3:8.9x8x0.2] [4:2.1x1.9x0.2] [N/A:N/A] Measurements L x W x D (cm) [3:55.92] [4:3.134] [N/A:N/A] A (cm) : rea [3:11.184] [4:0.627] [N/A:N/A] Volume (cm) : [3:-689.40%] [4:-299.20%] [N/A:N/A] % Reduction in Area: [3:-689.30%] [4:-299.40%] [N/A:N/A] % Reduction in Volume: [3:Full Thickness Without Exposed] [4:Full Thickness Without Exposed] [N/A:N/A] Classification: [3:Support Structures Large] [4:Support Structures Medium] [N/A:N/A] Exudate A mount: [3:Serosanguineous] [4:Serosanguineous] [N/A:N/A] Exudate Type: [3:red, brown] [4:red, brown] [N/A:N/A] Exudate Color: [3:Flat and Intact] [4:Distinct, outline attached] [N/A:N/A] Wound Margin: [3:Medium (34-66%)] [4:Large (67-100%)] [N/A:N/A] Granulation A mount: [3:Red] [4:Red] [N/A:N/A] Granulation Quality: [3:Medium (34-66%)] [4:Small (1-33%)] [N/A:N/A] Necrotic A mount: [3:Fat Layer (Subcutaneous Tissue): Yes Fat Layer (Subcutaneous Tissue): Yes N/A] Exposed Structures: [3:Fascia: No Tendon: No Muscle: No Joint: No Bone: No  Small (1-33%)] [4:Fascia: No Tendon: No Muscle: No Joint: No Bone: No None] [N/A:N/A] Epithelialization: [3:Debridement - Excisional] [4:N/A] [N/A:N/A] Debridement: Pre-procedure Verification/Time Out 11:18 [4:N/A] [N/A:N/A] Taken: [3:Other] [4:N/A] [N/A:N/A] Pain Control: [3:Subcutaneous, Slough] [4:N/A] [N/A:N/A] Tissue Debrided: [3:Skin/Subcutaneous Tissue] [4:N/A] [N/A:N/A] Level: [3:9] [4:N/A] [N/A:N/A] Debridement A (sq cm): [3:rea Curette] [4:N/A] [N/A:N/A] Instrument: [3:Minimum] [4:N/A] [N/A:N/A] Bleeding: [3:Pressure] [4:N/A] [N/A:N/A] Hemostasis A chieved: [3:5] [4:N/A] [N/A:N/A] Procedural Pain: [3:3] [4:N/A] [N/A:N/A] Post Procedural Pain: [3:Procedure was tolerated well] [4:N/A] [N/A:N/A] Debridement Treatment Response: [3:8.9x8x0.2] [4:N/A] [N/A:N/A] Post Debridement Measurements L x W x D (cm) [3:11.184] [4:N/A] [N/A:N/A] Post Debridement Volume: (cm) [3:Debridement] [4:N/A] [N/A:N/A] Treatment Notes Electronic Signature(s) Signed: 08/22/2020 4:16:43 PM By: Baltazar Najjarobson, Michael MD Signed: 08/23/2020 6:30:22 PM By: Zandra AbtsLynch, Shatara RN, BSN Entered By: Baltazar Najjarobson, Michael on 08/21/2020 11:26:50 -------------------------------------------------------------------------------- Multi-Disciplinary Care Plan Details Patient Name: Date of Service: Jamie BrinkWILSO N, Jamie RGEURITE W. 08/21/2020 10:30 A M Medical Record Number: [id number] Patient Account Number: [id number] Date of Birth/Sex: Treating RN: 01/17/1941 (80 y.o. Female) Zandra AbtsLynch, Shatara Primary Care Rylann Munford: Hiram GashEGGLESTO N-CLA RK, Lorenso QuarryV A LENICA Other Clinician: Referring Yamen Castrogiovanni: Treating Deolinda Frid/Extender: Diamantina Monksobson, Michael EGGLESTO N-CLA RK, V A LENICA Weeks in Treatment: 7 Active Inactive Venous Leg Ulcer Nursing Diagnoses: Actual venous Insuffiency (use after diagnosis is confirmed) Goals: Patient will maintain optimal edema control Date Initiated: 06/30/2020 Target Resolution Date: 09/01/2020 Goal Status: Active Interventions: Assess  peripheral edema status every visit. Compression as ordered Notes: Wound/Skin Impairment Nursing Diagnoses: Impaired tissue integrity Goals: Patient/caregiver will verbalize understanding of skin care regimen Date Initiated: 06/30/2020 Target Resolution Date: 09/01/2020 Goal Status: Active Ulcer/skin breakdown will have a volume reduction of 30% by week 4 Date Initiated: 06/30/2020 Date Inactivated: 08/07/2020 Target Resolution Date: 08/04/2020 Goal Status: Unmet Unmet Reason: non viable surface Interventions: Assess patient/caregiver ability to obtain necessary supplies Assess patient/caregiver ability to perform ulcer/skin care regimen upon admission and as needed Assess ulceration(s) every visit Provide education on ulcer and skin care Treatment Activities: Skin care regimen initiated : 06/30/2020 Topical wound management initiated : 06/30/2020 Notes: Electronic Signature(s) Signed: 08/23/2020 6:30:22 PM By: Zandra AbtsLynch, Shatara RN, BSN Entered By: Zandra AbtsLynch, Shatara on 08/21/2020 13:04:26 -------------------------------------------------------------------------------- Pain Assessment Details Patient Name: Date of Service: Jamie BrinkWILSO N, Jamie DittoMA RGEURITE W. 08/21/2020 10:30 A M Medical Record Number: [id number] Patient Account Number: [id number] Date of Birth/Sex: Treating RN: 03/24/1941 (80 y.o. Female) Fonnie MuBreedlove, Lauren Primary Care Raylee Adamec: Hiram GashEGGLESTO N-CLA RK, Lorenso QuarryV A LENICA Other Clinician: Referring Jamaury Gumz: Treating Kavya Haag/Extender: Diamantina Monksobson, Michael EGGLESTO N-CLA RK, V A LENICA Weeks in Treatment: 7 Active Problems Location of Pain Severity and Description of Pain Patient Has Paino Yes Site Locations Pain Location: Pain in Ulcers With Dressing  Change: Yes Duration of the Pain. Constant / Intermittento Intermittent Rate the pain. Current Pain Level: 9 Worst Pain Level: 10 Least Pain Level: 0 Tolerable Pain Level: 9 Character of Pain Describe the Pain: Aching Pain Management and  Medication Current Pain Management: Medication: Yes Cold Application: No Rest: Yes Massage: No Activity: No T.E.N.S.: No Heat Application: No Leg drop or elevation: No Is the Current Pain Management Adequate: Adequate How does your wound impact your activities of daily livingo Sleep: No Bathing: No Appetite: No Relationship With Others: No Bladder Continence: No Emotions: No Bowel Continence: No Work: No Toileting: No Drive: No Dressing: No Hobbies: No Electronic Signature(s) Signed: 08/21/2020 6:08:57 PM By: Fonnie Mu RN Entered By: Fonnie Mu on 08/21/2020 10:50:00 -------------------------------------------------------------------------------- Patient/Caregiver Education Details Patient Name: Date of Service: Jamie [name] 5/16/2022andnbsp10:30 A M Medical Record Number: [id number] Patient Account Number: [id number] Date of Birth/Gender: Treating RN: [birth year] (80 y.o. Female) Zandra Abts Primary Care Physician: Hiram Gash RK, Lorenso Quarry Other Clinician: Referring Physician: Treating Physician/Extender: Diamantina Monks N-CLA RK, V A LENICA Weeks in Treatment: 7 Education Assessment Education Provided To: Patient Education Topics Provided Wound/Skin Impairment: Methods: Explain/Verbal Responses: State content correctly Electronic Signature(s) Signed: 08/23/2020 6:30:22 PM By: Zandra Abts RN, BSN Entered By: Zandra Abts on 08/21/2020 13:04:36 -------------------------------------------------------------------------------- Wound Assessment Details Patient Name: Date of Service: Jamie [name], Jamie Ditto W. 08/21/2020 10:30 A M Medical Record Number: [id number] Patient Account Number: [id number] Date of Birth/Sex: Treating RN: 19-Dec-1940 (80 y.o. Female) Fonnie Mu Primary Care Safiyya Stokes: Hiram Gash RK, Lorenso Quarry Other Clinician: Referring Harjas Biggins: Treating Major Santerre/Extender: Diamantina Monks N-CLA  RK, V A LENICA Weeks in Treatment: 7 Wound Status Wound Number: 3 Primary Venous Leg Ulcer Etiology: Wound Location: Left, Distal, Medial Lower Leg Wound Open Wounding Event: Gradually Appeared Status: Date Acquired: 04/09/2019 Comorbid Sleep Apnea, Hypertension, Peripheral Venous Disease, Weeks Of Treatment: 7 History: Osteoarthritis, Neuropathy Clustered Wound: No Photos Wound Measurements Length: (cm) 8.9 Width: (cm) 8 Depth: (cm) 0.2 Area: (cm) 55.92 Volume: (cm) 11.184 % Reduction in Area: -689.4% % Reduction in Volume: -689.3% Epithelialization: Small (1-33%) Tunneling: No Undermining: No Wound Description Classification: Full Thickness Without Exposed Support Structures Wound Margin: Flat and Intact Exudate Amount: Large Exudate Type: Serosanguineous Exudate Color: red, brown Foul Odor After Cleansing: No Slough/Fibrino Yes Wound Bed Granulation Amount: Medium (34-66%) Exposed Structure Granulation Quality: Red Fascia Exposed: No Necrotic Amount: Medium (34-66%) Fat Layer (Subcutaneous Tissue) Exposed: Yes Necrotic Quality: Adherent Slough Tendon Exposed: No Muscle Exposed: No Joint Exposed: No Bone Exposed: No Treatment Notes Wound #3 (Lower Leg) Wound Laterality: Left, Medial, Distal Cleanser Wound Cleanser Discharge Instruction: Cleanse the wound with wound cleanser prior to applying a clean dressing using gauze sponges, not tissue or cotton balls. Soap and Water Discharge Instruction: May shower and wash wound with dial antibacterial soap and water prior to dressing change. Peri-Wound Care Triamcinolone 15 (g) Discharge Instruction: In clinic only. Apply to red, irritated periwound Sween Lotion (Moisturizing lotion) Discharge Instruction: apply at home. Apply moisturizing lotion as directed Topical Keystone Antibiotic Compound Discharge Instruction: Apply directly to wound bed under Sorbact Primary Dressing Cutimed Sorbact Swab Discharge  Instruction: Apply to wound over antibiotic spray Secondary Dressing Woven Gauze Sponge, Non-Sterile 4x4 in Discharge Instruction: Apply over primary dressing as directed. ABD Pad, 8x10 Discharge Instruction: Apply over primary dressing as directed. CarboFLEX Odor Control Dressing, 4x4 in Discharge Instruction: Apply over primary dressing in office only Secured With Compression Wrap ThreePress (3  layer compression wrap) Discharge Instruction: Apply three layer compression as directed. Compression Stockings Add-Ons Electronic Signature(s) Signed: 08/22/2020 5:09:03 PM By: Fonnie Mu RN Signed: 08/23/2020 11:07:31 AM By: Karl Ito Previous Signature: 08/21/2020 6:08:57 PM Version By: Fonnie Mu RN Entered By: Karl Ito on 08/22/2020 15:59:00 -------------------------------------------------------------------------------- Wound Assessment Details Patient Name: Date of Service: Jamie [name], Jamie Ditto W. 08/21/2020 10:30 A M Medical Record Number: [id number] Patient Account Number: [id number] Date of Birth/Sex: Treating RN: 02-Jan-1941 (80 y.o. Female) Fonnie Mu Primary Care Jadalynn Burr: Hiram Gash RK, Lorenso Quarry Other Clinician: Referring Lamika Connolly: Treating Aquilla Voiles/Extender: Diamantina Monks N-CLA RK, V A LENICA Weeks in Treatment: 7 Wound Status Wound Number: 4 Primary Venous Leg Ulcer Etiology: Wound Location: Left, Posterior Lower Leg Wound Open Wounding Event: Gradually Appeared Status: Date Acquired: 04/09/2019 Comorbid Sleep Apnea, Hypertension, Peripheral Venous Disease, Weeks Of Treatment: 7 History: Osteoarthritis, Neuropathy Clustered Wound: No Photos Wound Measurements Length: (cm) 2.1 Width: (cm) 1.9 Depth: (cm) 0.2 Area: (cm) 3.134 Volume: (cm) 0.627 % Reduction in Area: -299.2% % Reduction in Volume: -299.4% Epithelialization: None Tunneling: No Undermining: No Wound Description Classification: Full Thickness  Without Exposed Support Structures Wound Margin: Distinct, outline attached Exudate Amount: Medium Exudate Type: Serosanguineous Exudate Color: red, brown Wound Bed Granulation Amount: Large (67-100%) Granulation Quality: Red Necrotic Amount: Small (1-33%) Necrotic Quality: Adherent Slough Foul Odor After Cleansing: No Slough/Fibrino Yes Exposed Structure Fascia Exposed: No Fat Layer (Subcutaneous Tissue) Exposed: Yes Tendon Exposed: No Muscle Exposed: No Joint Exposed: No Bone Exposed: No Treatment Notes Wound #4 (Lower Leg) Wound Laterality: Left, Posterior Cleanser Wound Cleanser Discharge Instruction: Cleanse the wound with wound cleanser prior to applying a clean dressing using gauze sponges, not tissue or cotton balls. Soap and Water Discharge Instruction: May shower and wash wound with dial antibacterial soap and water prior to dressing change. Peri-Wound Care Triamcinolone 15 (g) Discharge Instruction: In clinic only. Apply to red, irritated periwound Sween Lotion (Moisturizing lotion) Discharge Instruction: apply at home. Apply moisturizing lotion as directed Topical Keystone Antibiotic Compound Discharge Instruction: Apply directly to wound bed under Sorbact Primary Dressing Cutimed Sorbact Swab Discharge Instruction: Apply to wound over antibiotic spray Secondary Dressing Woven Gauze Sponge, Non-Sterile 4x4 in Discharge Instruction: Apply over primary dressing as directed. ABD Pad, 8x10 Discharge Instruction: Apply over primary dressing as directed. CarboFLEX Odor Control Dressing, 4x4 in Discharge Instruction: Apply over primary dressing in office only Secured With Compression Wrap ThreePress (3 layer compression wrap) Discharge Instruction: Apply three layer compression as directed. Compression Stockings Add-Ons Electronic Signature(s) Signed: 08/22/2020 5:09:03 PM By: Fonnie Mu RN Signed: 08/23/2020 11:07:31 AM By: Karl Ito Previous  Signature: 08/21/2020 6:08:57 PM Version By: Fonnie Mu RN Entered By: Karl Ito on 08/22/2020 15:59:27 -------------------------------------------------------------------------------- Vitals Details Patient Name: Date of Service: Jamie Brink, Jamie RGEURITE W. 08/21/2020 10:30 A M Medical Record Number: [id number] Patient Account Number: [id number] Date of Birth/Sex: Treating RN: 07-Apr-1941 (80 y.o. Female) Fonnie Mu Primary Care Martrice Apt: Hiram Gash RK, Lorenso Quarry Other Clinician: Referring Jolayne Branson: Treating Anja Neuzil/Extender: Diamantina Monks N-CLA RK, V A LENICA Weeks in Treatment: 7 Vital Signs Time Taken: 10:49 Temperature (F): 97.7 Height (in): 63 Pulse (bpm): 74 Weight (lbs): 150 Respiratory Rate (breaths/min): 17 Body Mass Index (BMI): 26.6 Blood Pressure (mmHg): 118/77 Reference Range: 80 - 120 mg / dl Electronic Signature(s) Signed: 08/21/2020 6:08:57 PM By: Fonnie Mu RN Entered By: Fonnie Mu on 08/21/2020 10:49:24

## 2020-08-23 NOTE — Progress Notes (Signed)
KENZLEY, KE ([id number]) Visit Report for 08/21/2020 Arrival Information Details Patient Name: Date of Service: Jamie [name], [name]. 08/21/2020 4:30 PM Medical Record Number: [id number] Patient Account Number: [id number] Date of Birth/Sex: Treating RN: Mar 30, 1941 (80 y.o. [name] Primary Care Antara Brecheisen: Hiram Gash RK, Lorenso Quarry Other Clinician: Referring Jakyiah Briones: Treating Robley Matassa/Extender: Diamantina Monks N-CLA RK, V A LENICA Weeks in Treatment: 0 Visit Information Patient Arrived: Wheel Chair Arrival Time: 16:40 Accompanied By: niece Transfer Assistance: None Patient Identification Verified: Yes Secondary Verification Process Completed: Yes Electronic Signature(s) Signed: 08/23/2020 6:30:22 PM By: Zandra Abts RN, BSN Entered By: Zandra Abts on 08/21/2020 17:30:08

## 2020-08-24 ENCOUNTER — Encounter (HOSPITAL_COMMUNITY): Payer: 59

## 2020-08-28 ENCOUNTER — Encounter (HOSPITAL_BASED_OUTPATIENT_CLINIC_OR_DEPARTMENT_OTHER): Payer: Medicare Other | Admitting: Internal Medicine

## 2020-08-28 ENCOUNTER — Other Ambulatory Visit: Payer: Self-pay

## 2020-08-28 DIAGNOSIS — I87332 Chronic venous hypertension (idiopathic) with ulcer and inflammation of left lower extremity: Secondary | ICD-10-CM | POA: Diagnosis not present

## 2020-08-28 NOTE — Progress Notes (Signed)
Jamie [name], Jamie [name] ([id number]) Visit Report for 08/28/2020 Chief Complaint Document Details Patient Name: Date of Service: Jamie [name], [name]. 08/28/2020 10:30 A M Medical Record Number: [id number] Patient Account Number: [id number] Date of Birth/Sex: Treating RN: 1940/12/04 (80 y.o. Jamie [name] Primary Care Provider: Hiram Gash [name], Jamie [name] Other Clinician: Referring Provider: Treating Provider/Extender: Jamie [name] Jamie Jamie [name] [name] in Treatment: 8 Information Obtained from: Patient Chief Complaint 06/30/2020; patient is here for review of wounds centered on her left medial lower leg and ankle Electronic Signature(s) Signed: 08/28/2020 12:33:40 PM By: Jamie Corwin DO Entered By: Jamie [name] on 08/28/2020 12:25:27 -------------------------------------------------------------------------------- HPI Details Patient Name: Date of Service: Jamie [name], Jamie [name] 08/28/2020 10:30 A M Medical Record Number: [id number] Patient Account Number: [id number] Date of Birth/Sex: Treating RN: Jul 11, 1940 (80 y.o. Jamie [name] Primary Care Provider: Hiram Gash [name], Jamie [name] Other Clinician: Referring Provider: Treating Provider/Extender: Jamie [name] Jamie Jamie [name] [name] in Treatment: 8 History of Present Illness HPI Description: ADMISSION 06/30/2020 Jamie [name] is an 80 year old woman who lives in [place]. She is here with her niece for review of wounds on the left medial lower leg and ankle. These have apparently been present for over a year and she followed with Dr. Olegario [name] at the [name] in [place] for quite a period of time although it looks as though there was a initial consult wound from Dr. Marcha [name] on February 18 presumably there was therefore hiatus. At that point the wounds were described as being there for 3 months. She also tells me she was at the wound care [name] in [name] for a period of time  with this. 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 She had venous studies that were negative for DVT ABIs on the right were 1.01 on the left 1.06. She has . had previous applications of puraply, compression which she does not tolerate very well. She has had several rounds of oral antibiotic therapy. She complains of unrelenting pain and she is seeing Dr. Reece [name] [facility] of pain management apparently was on oxycodone but that did not help. She also had a skin biopsy done by Dr. Olegario [name] although we do not have that result. She does not appear to have an arterial issue. I am not completely clear what she has been putting on the wounds lately. 3/31; this patient has a particularly nasty set of wounds on the left medial ankle in the middle of what looks to be hemosiderin deposition secondary to chronic venous insufficiency. She has a lot of pain followed by pain management. Dr. Marcha [name] apparently did a biopsy of something on the left leg last fall what I would like to get this result. She has a history of MRSA treatment. I do not believe she had reflux studies but she did have DVT rule out studies. Previous ABIs have not suggested arterial insufficiency 4/7; difficult wounds on the left medial ankle probably chronic venous insufficiency. With considerable effort on behalf of our case manager we were able to finally to speak to somebody at the hospital in [name] who indicated that no biopsy of this area have been done even though the patient describes this in some detail and is even able to point out where she thinks the biopsy was done. We have been using Sorbact. The PCR culture I did showed polymicrobial identification with Pseudomonas, staph aureus, Peptostreptococcus. All of this and  low titers. Resistance genes identified were MRSA, staph virulence gene and tetracycline. We are going to send this to [name] for a topical antibiotic which is something that we have  had good success with recently in large venous ulcers with a lot of purulent drainage. There would not be an easy oral alternative here possibly line escalated and ciprofloxacin if we need to use systemic antibiotic 4/15; difficult area on the left medial ankle. Most likely chronic venous insufficiency. I think she will probably need venous reflux study I think she had DVT rule outs but not venous reflux studies. We have not yet obtained the topical antibiotics. She has home health changing the dressing we have been using Sorbact for adherent fibrinous debris on the surface. Very difficult to remove 4/22; patient presents for 1 week follow-up. She has been using sore back under compression wraps and these are changed 3 times a week with home health. She also had Keystone antibiotics sent to her house and brought them in today. She has no complaints or issues today. 5/2; patient is here for follow-up. She has been using Sorbact under compression. Very painful wound. She has been using Keystone antibiotics. Not much improvement although the more medial part of the wound has cleaned up nicely and the larger part of the wound about 50% slough covered. Part of the issue here is that she had stays at both [name] wound care [name], [facility] wound care [name] and now [place]. Not sure if she has had venous reflux studies. As far as we are able to tell she did not have a biopsy. My notes state that she did not have venous reflux studies just DVT rule outs. 5/16; patient goes for venous reflux studies this afternoon. She says that wound was biopsied which sounds like punch biopsies by Dr. Lynden Ang we do not have these results. We are using Sorbact. Very difficult wound to debride 5/23; patient presents for 1 week follow-up. She reports tolerating the wraps well with sorbact underneath. She had ABIs and venous reflux studies done. She has no issues or complaints today. She denies acute signs of infection. Electronic  Signature(s) Signed: 08/28/2020 12:33:40 PM By: Jamie Corwin DO Entered By: Jamie [name] on 08/28/2020 12:26:16 -------------------------------------------------------------------------------- Physical Exam Details Patient Name: Date of Service: Jamie [name]. 08/28/2020 10:30 A M Medical Record Number: [id number] Patient Account Number: [id number] Date of Birth/Sex: Treating RN: 1940/12/25 (80 y.o. Jamie [name] Primary Care Provider: Hiram Gash [name], Jamie [name] Other Clinician: Referring Provider: Treating Provider/Extender: Jamie [name] Jamie Jamie [name] [name] in Treatment: 8 Constitutional respirations regular, non-labored and within target range for patient.. Cardiovascular 2+ dorsalis pedis/posterior tibialis pulses. Psychiatric pleasant and cooperative. Notes Left lower extremity: Patient has 2 wounds on her left lower leg. There is some epithelialization to the distal wound trying to break off and become 2 wounds. There is some nonviable tissue throughout. Most of the tissue looks healthy. No acute signs of infection. She has signs of chronic venous insufficiency. Electronic Signature(s) Signed: 08/28/2020 12:33:40 PM By: Jamie Corwin DO Entered By: Jamie [name] on 08/28/2020 12:30:09 -------------------------------------------------------------------------------- Physician Orders Details Patient Name: Date of Service: Jamie [name], Jamie [name] 08/28/2020 10:30 A M Medical Record Number: [id number] Patient Account Number: [id number] Date of Birth/Sex: Treating RN: 21-Feb-1941 (80 y.o. 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 - with Dr. Leanord Hawking Return A Bathing/ Shower/ Hygiene May shower with protection but do not get wound dressing(s) wet. Edema Control - Lymphedema / SCD / Other Elevate legs to the level of the heart or above for 30 minutes daily and/or when sitting, a frequency of: - throughout the day 3-4 times a day. Avoid standing for long periods of time. Exercise regularly Additional Orders / Instructions Follow Nutritious Diet Home Health No change in wound care orders this week; continue Home Health for wound care. May utilize formulary equivalent dressing for wound treatment orders unless otherwise specified. - ***DO NOT USE HYDROGEL SHEET - ONLY USE PLAIN HYDROGEL OR [place] JELLY*** Other Home Health Orders/Instructions: - [facility] fax #[phone] Wound Treatment Wound #3 - Lower Leg Wound Laterality: Left, Medial, Distal Cleanser: Soap and Water (Home Health) 2 x Per Week/30 Days Discharge Instructions: May shower and wash wound with dial antibacterial soap and water prior to dressing change. Cleanser: Wound Cleanser (Home Health) 2 x Per Week/30 Days Discharge Instructions: Cleanse the wound with wound cleanser prior to applying a clean dressing using gauze sponges, not tissue or cotton balls. Peri-Wound Care: Triamcinolone 15 (g) 2 x Per Week/30 Days Discharge Instructions: In clinic only. Apply to red, irritated periwound Peri-Wound Care: Sween Lotion (Moisturizing lotion) (Home Health) 2 x Per Week/30 Days Discharge Instructions: apply at home. Apply moisturizing lotion as directed Topical: Keystone Antibiotic Compound (Home Health) 2 x Per Week/30 Days Discharge Instructions: Apply directly to wound bed under Sorbact Prim Dressing: Cutimed Sorbact Swab (Home Health) 2 x Per Week/30 Days ary Discharge  Instructions: Apply to wound over antibiotic spray Secondary Dressing: Woven Gauze Sponge, Non-Sterile 4x4 in (Home Health) 2 x Per Week/30 Days Discharge Instructions: Apply over primary dressing as directed. Secondary Dressing: ABD Pad, 8x10 (Home Health) 2 x Per Week/30 Days Discharge Instructions: Apply over primary dressing as directed. Secondary Dressing: CarboFLEX Odor Control Dressing, 4x4 in (Home Health) 2 x Per Week/30 Days Discharge Instructions: Apply over primary dressing in office only Compression Wrap: ThreePress (3 layer compression wrap) 2 x Per Week/30 Days Discharge Instructions: Apply three layer compression as directed. Wound #4 - Lower Leg Wound Laterality: Left, Posterior Cleanser: Soap and Water (Home Health) 2 x Per Week/30 Days Discharge Instructions: May shower and wash wound with dial antibacterial soap and water prior to dressing change. Cleanser: Wound Cleanser (Home Health) 2 x Per Week/30 Days Discharge Instructions: Cleanse the wound with wound cleanser prior to applying a clean dressing using gauze sponges, not tissue or cotton balls. Peri-Wound Care: Triamcinolone 15 (g) 2 x Per Week/30 Days Discharge Instructions: In clinic only. Apply to red, irritated periwound Peri-Wound Care: Sween Lotion (Moisturizing lotion) (Home Health) 2 x Per Week/30 Days Discharge Instructions: apply at home. Apply moisturizing lotion as directed Topical: Keystone Antibiotic Compound (Home Health) 2 x Per Week/30 Days Discharge Instructions: Apply directly to wound bed under Sorbact Prim Dressing: Cutimed Sorbact Swab (Home Health) 2 x Per Week/30 Days ary Discharge Instructions: Apply to wound over antibiotic spray Secondary Dressing: Woven Gauze Sponge, Non-Sterile 4x4 in (Home Health) 2 x Per Week/30 Days Discharge Instructions: Apply over primary dressing as directed. Secondary Dressing: ABD Pad, 8x10 (Home Health) 2 x Per  Week/30 Days Discharge Instructions: Apply over  primary dressing as directed. Secondary Dressing: CarboFLEX Odor Control Dressing, 4x4 in (Home Health) 2 x Per Week/30 Days Discharge Instructions: Apply over primary dressing in office only Compression Wrap: ThreePress (3 layer compression wrap) 2 x Per Week/30 Days Discharge Instructions: Apply three layer compression as directed. 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 08/28/2020 10:30 A M Medical Record Number: [id number] Patient Account Number: [id number] Date of Birth/Sex: Treating RN: 05/21/1940 (80 y.o. 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 08/28/2020 10:30 A M Medical Record Number: [id number] Patient Account Number: [id number] Date of  Birth/Sex: Treating RN: 02/06/1941 (80 y.o. Jamie LinkF) Lynch, Shatara Primary Care Provider: Hiram GashEGGLESTO Jamie [name], Jamie QuarryV A Jamie Other Clinician: Referring Provider: Treating Provider/Extender: Jamie BassHoffman, Ashrith Sagan EGGLESTO Jamie [name] [name] in Treatment: 8 Subjective Chief Complaint Information obtained from Patient 06/30/2020; patient is here for review of wounds centered on her left medial lower leg and ankle History of Present Illness (HPI) ADMISSION 06/30/2020 [name] is an 80 year old woman who lives in [place]. She is here with her niece for review of wounds on the left medial lower leg and ankle. These have apparently been present for over a year and she followed with [name] in [place] for quite a period of time although it looks as though there was a initial consult wound from Dr. Marcha Soldersathey on February 18 presumably there was therefore hiatus. At that point the wounds were described as being there for 3 months. She also tells me she was at the wound care [name] in [place] for a period of time with this. 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 She had venous studies that were negative for DVT ABIs on the right were 1.01 on the left 1.06. She has . had previous applications of puraply, compression which she does not tolerate very well. She has had several rounds of oral antibiotic therapy. She complains of unrelenting pain and she is seeing Dr. Reece AgarEspy Jamie of pain management apparently was on oxycodone but that did not help. She also had a skin biopsy done by Dr. Olegario MessierKathy although we do not have that result. She does not appear to have an arterial issue. I am not completely clear what she has been putting on the wounds lately. 3/31; this patient has a particularly nasty set of wounds on the left medial ankle in the middle of what looks to be hemosiderin deposition secondary to chronic venous insufficiency. She has a  lot of pain  followed by pain management. Dr. Marcha [name] apparently did a biopsy of something on the left leg last fall what I would like to get this result. She has a history of MRSA treatment. I do not believe she had reflux studies but she did have DVT rule out studies. Previous ABIs have not suggested arterial insufficiency 4/7; difficult wounds on the left medial ankle probably chronic venous insufficiency. With considerable effort on behalf of our case manager we were able to finally to speak to somebody at the hospital in [place] who indicated that no biopsy of this area have been done even though the patient describes this in some detail and is even able to point out where she thinks the biopsy was done. We have been using Sorbact. The PCR culture I did showed polymicrobial identification with Pseudomonas, staph aureus, Peptostreptococcus. All of this and low titers. Resistance genes identified were MRSA, staph virulence gene and tetracycline. We are going to send this to [name] for a topical antibiotic which is something that we have had good success with recently in large venous ulcers with a lot of purulent drainage. There would not be an easy oral alternative here possibly line escalated and ciprofloxacin if we need to use systemic antibiotic 4/15; difficult area on the left medial ankle. Most likely chronic venous insufficiency. I think she will probably need venous reflux study I think she had DVT rule outs but not venous reflux studies. We have not yet obtained the topical antibiotics. She has home health changing the dressing we have been using Sorbact for adherent fibrinous debris on the surface. Very difficult to remove 4/22; patient presents for 1 week follow-up. She has been using sore back under compression wraps and these are changed 3 times a week with home health. She also had Keystone antibiotics sent to her house and brought them in today. She has no complaints or issues  today. 5/2; patient is here for follow-up. She has been using Sorbact under compression. Very painful wound. She has been using Keystone antibiotics. Not much improvement although the more medial part of the wound has cleaned up nicely and the larger part of the wound about 50% slough covered. Part of the issue here is that she had stays at both [name] wound care [name], [facility] wound care [name] and now [place]. Not sure if she has had venous reflux studies. As far as we are able to tell she did not have a biopsy. My notes state that she did not have venous reflux studies just DVT rule outs. 5/16; patient goes for venous reflux studies this afternoon. She says that wound was biopsied which sounds like punch biopsies by Dr. Lynden Ang we do not have these results. We are using Sorbact. Very difficult wound to debride 5/23; patient presents for 1 week follow-up. She reports tolerating the wraps well with sorbact underneath. She had ABIs and venous reflux studies done. She has no issues or complaints today. She denies acute signs of infection. Patient History Information obtained from Patient. Family History Unknown History. Social History Former smoker, Marital Status - Single, Alcohol Use - Never, Drug Use - No History, Caffeine Use - Rarely. Medical History Respiratory Patient has history of Sleep Apnea Cardiovascular Patient has history of Hypertension, Peripheral Venous Disease Musculoskeletal Patient has history of Osteoarthritis Neurologic Patient has history of Neuropathy Medical A Surgical History Notes nd Cardiovascular Heart murmur Psychiatric Anxiety, depression Objective Constitutional respirations regular, non-labored and within target range for patient.. Vitals Time Taken: 11:08 AM,  Height: 63 in, Weight: 150 lbs, BMI: 26.6, Temperature: 98.4 F, Pulse: 74 bpm, Respiratory Rate: 17 breaths/min, Blood Pressure: 158/89 mmHg. Cardiovascular 2+ dorsalis pedis/posterior tibialis  pulses. Psychiatric pleasant and cooperative. General Notes: Left lower extremity: Patient has 2 wounds on her left lower leg. There is some epithelialization to the distal wound trying to break off and become 2 wounds. There is some nonviable tissue throughout. Most of the tissue looks healthy. No acute signs of infection. She has signs of chronic venous insufficiency. Integumentary (Hair, Skin) Wound #3 status is Open. Original cause of wound was Gradually Appeared. The date acquired was: 04/09/2019. The wound has been in treatment 8 [name]. The wound is located on the Left,Distal,Medial Lower Leg. The wound measures 8.4cm length x 7cm width x 0.2cm depth; 46.181cm^2 area and 9.236cm^3 volume. There is Fat Layer (Subcutaneous Tissue) exposed. There is no tunneling or undermining noted. There is a large amount of serosanguineous drainage noted. The wound margin is flat and intact. There is large (67-100%) red granulation within the wound bed. There is a small (1-33%) amount of necrotic tissue within the wound bed including Adherent Slough. Wound #4 status is Open. Original cause of wound was Gradually Appeared. The date acquired was: 04/09/2019. The wound has been in treatment 8 [name]. The wound is located on the Left,Posterior Lower Leg. The wound measures 2cm length x 2cm width x 0.2cm depth; 3.142cm^2 area and 0.628cm^3 volume. There is Fat Layer (Subcutaneous Tissue) exposed. There is no tunneling or undermining noted. There is a medium amount of serosanguineous drainage noted. The wound margin is distinct with the outline attached to the wound base. There is large (67-100%) red granulation within the wound bed. There is a small (1-33%) amount of necrotic tissue within the wound bed including Adherent Slough. 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 Jannetta Quint. Patient's wounds are stable. No signs of infection. Patient declined in office debridement. We will continue with sorbact, Keystone and 3 layer compression. Patient had ABIs and venous reflux studies done on 5/16. The right ABI was 1.14 and left ABI was 1.34. She had reflux studies that showed reflux noted in the left greater saphenous vein and left short saphenous vein. She also was noted to have venous reflux in the right common femoral vein. Will refer to vascular for these results. She will see Dr. Leanord Hawking back in 2 [name]. Procedures Wound #3 Pre-procedure diagnosis of Wound #3 is a Venous Leg Ulcer located on the Left,Distal,Medial Lower Leg . There was a Three Layer Compression Therapy Procedure by Zandra Abts, RN. Post procedure Diagnosis Wound #3: Same as Pre-Procedure Wound #4 Pre-procedure diagnosis of Wound #4 is a Venous Leg Ulcer located on the Left,Posterior Lower Leg . There was a Three Layer Compression Therapy Procedure by Zandra Abts, RN. Post procedure Diagnosis Wound #4: Same as Pre-Procedure Plan Follow-up Appointments: Return Appointment in 2 [name]. - with Dr. Chauncey Mann Shower/ Hygiene: May shower with protection but do not get wound dressing(s) wet. Edema Control - Lymphedema / SCD / Other: Elevate legs to the level of the heart or above for 30 minutes daily and/or when sitting, a frequency of: - throughout the day 3-4 times a day. Avoid standing for long periods of time. Exercise regularly Additional Orders / Instructions: Follow Nutritious Diet Home Health: No change in  wound care orders this week; continue Home Health for wound care. May utilize formulary equivalent dressing for wound treatment orders unless otherwise specified. - ***DO NOT USE HYDROGEL SHEET - ONLY USE PLAIN HYDROGEL OR [place] JELLY*** Other Home Health Orders/Instructions: - [facility] fax  #[phone] Consults ordered were: Vascular Surgeon - Abnormal reflux study 5/16, no healing wounds on left lower leg WOUND #3: - Lower Leg Wound Laterality: Left, Medial, Distal Cleanser: Soap and Water (Home Health) 2 x Per Week/30 Days Discharge Instructions: May shower and wash wound with dial antibacterial soap and water prior to dressing change. Cleanser: Wound Cleanser (Home Health) 2 x Per Week/30 Days Discharge Instructions: Cleanse the wound with wound cleanser prior to applying a clean dressing using gauze sponges, not tissue or cotton balls. Peri-Wound Care: Triamcinolone 15 (g) 2 x Per Week/30 Days Discharge Instructions: In clinic only. Apply to red, irritated periwound Peri-Wound Care: Sween Lotion (Moisturizing lotion) (Home Health) 2 x Per Week/30 Days Discharge Instructions: apply at home. Apply moisturizing lotion as directed Topical: Keystone Antibiotic Compound (Home Health) 2 x Per Week/30 Days Discharge Instructions: Apply directly to wound bed under Sorbact Prim Dressing: Cutimed Sorbact Swab (Home Health) 2 x Per Week/30 Days ary Discharge Instructions: Apply to wound over antibiotic spray Secondary Dressing: Woven Gauze Sponge, Non-Sterile 4x4 in (Home Health) 2 x Per Week/30 Days Discharge Instructions: Apply over primary dressing as directed. Secondary Dressing: ABD Pad, 8x10 (Home Health) 2 x Per Week/30 Days Discharge Instructions: Apply over primary dressing as directed. Secondary Dressing: CarboFLEX Odor Control Dressing, 4x4 in (Home Health) 2 x Per Week/30 Days Discharge Instructions: Apply over primary dressing in office only Com pression Wrap: ThreePress (3 layer compression wrap) 2 x Per Week/30 Days Discharge Instructions: Apply three layer compression as directed. WOUND #4: - Lower Leg Wound Laterality: Left, Posterior Cleanser: Soap and Water (Home Health) 2 x Per Week/30 Days Discharge Instructions: May shower and wash wound with dial  antibacterial soap and water prior to dressing change. Cleanser: Wound Cleanser (Home Health) 2 x Per Week/30 Days Discharge Instructions: Cleanse the wound with wound cleanser prior to applying a clean dressing using gauze sponges, not tissue or cotton balls. Peri-Wound Care: Triamcinolone 15 (g) 2 x Per Week/30 Days Discharge Instructions: In clinic only. Apply to red, irritated periwound Peri-Wound Care: Sween Lotion (Moisturizing lotion) (Home Health) 2 x Per Week/30 Days Discharge Instructions: apply at home. Apply moisturizing lotion as directed Topical: Keystone Antibiotic Compound (Home Health) 2 x Per Week/30 Days Discharge Instructions: Apply directly to wound bed under Sorbact Prim Dressing: Cutimed Sorbact Swab (Home Health) 2 x Per Week/30 Days ary Discharge Instructions: Apply to wound over antibiotic spray Secondary Dressing: Woven Gauze Sponge, Non-Sterile 4x4 in (Home Health) 2 x Per Week/30 Days Discharge Instructions: Apply over primary dressing as directed. Secondary Dressing: ABD Pad, 8x10 (Home Health) 2 x Per Week/30 Days Discharge Instructions: Apply over primary dressing as directed. Secondary Dressing: CarboFLEX Odor Control Dressing, 4x4 in (Home Health) 2 x Per Week/30 Days Discharge Instructions: Apply over primary dressing in office only Com pression Wrap: ThreePress (3 layer compression wrap) 2 x Per Week/30 Days Discharge Instructions: Apply three layer compression as directed. 1. Sorbact, Keystone and 3 layer compression 2. Vein and vascular referral 3. Follow-up in 2 [name] with Dr. Leanord Hawking Electronic Signature(s) Signed: 08/28/2020 12:33:40 PM By: Jamie Corwin DO Entered By: Jamie [name] on 08/28/2020 12:33:05 -------------------------------------------------------------------------------- HxROS Details Patient Name: Date of Service: Jamie Brink, [name] 08/28/2020  10:30 A M Medical Record Number: [id number] Patient Account Number:  [id number] Date of Birth/Sex: Treating RN: 1940-07-17 (80 y.o. Jamie [name] Primary Care Provider: Hiram Gash [name], Jamie [name] Other Clinician: Referring Provider: Treating Provider/Extender: Jamie [name] Jamie Jamie [name] [name] in Treatment: 8 Information Obtained From Patient Respiratory Medical History: Positive for: Sleep Apnea Cardiovascular Medical History: Positive for: Hypertension; Peripheral Venous Disease Past Medical History Notes: Heart murmur Musculoskeletal Medical History: Positive for: Osteoarthritis Neurologic Medical History: Positive for: Neuropathy Psychiatric Medical History: Past Medical History Notes: Anxiety, depression Immunizations Pneumococcal Vaccine: Received Pneumococcal Vaccination: Yes Implantable Devices None Family and Social History Unknown History: Yes; Former smoker; Marital Status - Single; Alcohol Use: Never; Drug Use: No History; Caffeine Use: Rarely; Financial Concerns: No; Food, Clothing or Shelter Needs: No; Support System Lacking: No; Transportation Concerns: No Electronic Signature(s) Signed: 08/28/2020 12:33:40 PM By: Jamie Corwin DO Signed: 08/28/2020 5:27:53 PM By: Zandra Abts RN, BSN Entered By: Jamie [name] on 08/28/2020 12:26:22 -------------------------------------------------------------------------------- SuperBill Details Patient Name: Date of Service: Jamie [name], Jamie Grief. 08/28/2020 Medical Record Number: [id number] Patient Account Number: [id number] Date of Birth/Sex: Treating RN: [birth year] (80 y.o. 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

## 2020-08-28 NOTE — Progress Notes (Signed)
TING, CAGE ([id number]) Visit Report for 08/28/2020 Arrival Information Details Patient Name: Date of Service: Jamie [name], [name]. 08/28/2020 10:30 A M Medical Record Number: [id number] Patient Account Number: [id number] Date of Birth/Sex: Treating RN: Nov 30, 1940 (80 y.o. Jamie [name], Jamie [name] Primary Care Vinayak Bobier: Hiram Gash RK, Lorenso Quarry Other Clinician: Referring Baila Rouse: Treating Tien Aispuro/Extender: Olive Bass N-CLA RK, V A LENICA Weeks in Treatment: 8 Visit Information History Since Last Visit Added or deleted any medications: No Patient Arrived: Wheel Chair Any new allergies or adverse reactions: No Arrival Time: 11:02 Had a fall or experienced change in No Accompanied By: cg activities of daily living that may affect Transfer Assistance: None risk of falls: Patient Identification Verified: Yes Signs or symptoms of abuse/neglect since last visito No Secondary Verification Process Completed: Yes Hospitalized since last visit: No Patient Requires Transmission-Based Precautions: No Implantable device outside of the clinic excluding No Patient Has Alerts: Yes cellular tissue based products placed in the center Patient Alerts: L ABI: 1.06; R ABI: 1.01 since last visit: Has Dressing in Place as Prescribed: Yes Has Compression in Place as Prescribed: Yes Pain Present Now: No Electronic Signature(s) Signed: 08/28/2020 5:30:27 PM By: Fonnie Mu RN Entered By: Fonnie Mu on 08/28/2020 11:02:29 -------------------------------------------------------------------------------- Compression Therapy Details Patient Name: Date of Service: Jamie [name] W. 08/28/2020 10:30 A M Medical Record Number: [id number] Patient Account Number: [id number] Date of Birth/Sex: Treating RN: [birth year] (80 y.o. Jamie [name] Primary Care Annaelle Kasel: Hiram Gash RK, Lorenso Quarry Other Clinician: Referring Kevan Prouty: Treating Burman Bruington/Extender: Olive Bass N-CLA RK, V A LENICA Weeks in Treatment: 8 Compression Therapy Performed for Wound Assessment: Wound #3 Left,Distal,Medial Lower Leg Performed By: Clinician Zandra Abts, RN Compression Type: Three Layer Post Procedure Diagnosis Same as Pre-procedure Electronic Signature(s) Signed: 08/28/2020 5:27:53 PM By: Zandra Abts RN, BSN Entered By: Zandra Abts on 08/28/2020 12:10:31 -------------------------------------------------------------------------------- Compression Therapy Details Patient Name: Date of Service: Jamie [name], Jamie [name] 08/28/2020 10:30 A M Medical Record Number: [id number] Patient Account Number: [id number] Date of Birth/Sex: Treating RN: 23-Mar-1941 (80 y.o. Jamie [name] Primary Care Shara Hartis: Hiram Gash RK, Lorenso Quarry Other Clinician: Referring Thoren Hosang: Treating Riley Papin/Extender: Olive Bass N-CLA RK, V A LENICA Weeks in Treatment: 8 Compression Therapy Performed for Wound Assessment: Wound #4 Left,Posterior Lower Leg Performed By: Clinician Zandra Abts, RN Compression Type: Three Layer Post Procedure Diagnosis Same as Pre-procedure Electronic Signature(s) Signed: 08/28/2020 5:27:53 PM By: Zandra Abts RN, BSN Entered By: Zandra Abts on 08/28/2020 12:10:31 -------------------------------------------------------------------------------- Encounter Discharge Information Details Patient Name: Date of Service: Jamie [name], Jamie [name] 08/28/2020 10:30 A M Medical Record Number: [id number] Patient Account Number: [id number] Date of Birth/Sex: Treating RN: 03-07-1941 (80 y.o. Jamie [name] Primary Care Muhammad Vacca: Hiram Gash RK, Lorenso Quarry Other Clinician: Referring Kalynne Womac: Treating Yalena Colon/Extender: Olive Bass N-CLA RK, V A LENICA Weeks in Treatment: 8 Encounter Discharge Information Items Discharge Condition: Stable Ambulatory Status: Wheelchair Discharge Destination:  Home Transportation: Private Auto Accompanied By: daughter Schedule Follow-up Appointment: Yes Clinical Summary of Care: Electronic Signature(s) Signed: 08/28/2020 6:20:16 PM By: Shawn Stall Entered By: Shawn Stall on 08/28/2020 13:21:19 -------------------------------------------------------------------------------- Lower Extremity Assessment Details Patient Name: Date of Service: Jamie [name], [name]. 08/28/2020 10:30 A M Medical Record Number: [id number] Patient Account Number: [id number] Date of Birth/Sex: Treating RN: 08/09/1940 (80 y.o. Jamie [name], Jamie [name] Primary Care Annalise Mcdiarmid: Hiram Gash RK, Lorenso Quarry Other Clinician: Referring Garett Tetzloff: Treating Otilio Groleau/Extender: Olive Bass N-CLA  RK, V A LENICA Weeks in Treatment: 8 Edema Assessment Assessed: [Left: Yes] [Right: No] Edema: [Left: Ye] [Right: s] Calf Left: Right: Point of Measurement: 29 cm From Medial Instep 34.5 cm Ankle Left: Right: Point of Measurement: 11 cm From Medial Instep 24.5 cm Vascular Assessment Pulses: Dorsalis Pedis Palpable: [Left:Yes] Electronic Signature(s) Signed: 08/28/2020 5:30:27 PM By: Fonnie MuBreedlove, Lauren RN Entered By: Fonnie MuBreedlove, Jamie [name] on 08/28/2020 11:23:58 -------------------------------------------------------------------------------- Multi Wound Chart Details Patient Name: Date of Service: Jamie BrinkWILSO N, Jamie GaveMA [name] 08/28/2020 10:30 A M Medical Record Number: [id number] Patient Account Number: [id number] Date of Birth/Sex: Treating RN: 10/16/1940 (80 y.o. Jamie LinkF) Lynch, Jamie [name] Primary Care Alayia Meggison: Hiram GashEGGLESTO N-CLA RK, Lorenso QuarryV A LENICA Other Clinician: Referring Susanna Benge: Treating Ellionna Buckbee/Extender: Olive BassHoffman, Jessica EGGLESTO N-CLA RK, V A LENICA Weeks in Treatment: 8 Vital Signs Height(in): 63 Pulse(bpm): 74 Weight(lbs): 150 Blood Pressure(mmHg): 158/89 Body Mass Index(BMI): 27 Temperature(F): 98.4 Respiratory Rate(breaths/min): 17 Photos: [3:No Photos Left,  Distal, Medial Lower Leg] [4:No Photos Left, Posterior Lower Leg] [N/A:N/A N/A] Wound Location: [3:Gradually Appeared] [4:Gradually Appeared] [N/A:N/A] Wounding Event: [3:Venous Leg Ulcer] [4:Venous Leg Ulcer] [N/A:N/A] Primary Etiology: [3:Sleep Apnea, Hypertension, Peripheral Sleep Apnea, Hypertension, Peripheral N/A] Comorbid History: [3:Venous Disease, Osteoarthritis, Neuropathy 04/09/2019] [4:Venous Disease, Osteoarthritis, Neuropathy 04/09/2019] [N/A:N/A] Date Acquired: [3:8] [4:8] [N/A:N/A] Weeks of Treatment: [3:Open] [4:Open] [N/A:N/A] Wound Status: [3:8.4x7x0.2] [4:2x2x0.2] [N/A:N/A] Measurements L x W x D (cm) [3:46.181] [4:3.142] [N/A:N/A] A (cm) : rea [3:9.236] [4:0.628] [N/A:N/A] Volume (cm) : [3:-551.90%] [4:-300.30%] [N/A:N/A] % Reduction in Area: [3:-551.80%] [4:-300.00%] [N/A:N/A] % Reduction in Volume: [3:Full Thickness Without Exposed] [4:Full Thickness Without Exposed] [N/A:N/A] Classification: [3:Support Structures Large] [4:Support Structures Medium] [N/A:N/A] Exudate Amount: [3:Serosanguineous] [4:Serosanguineous] [N/A:N/A] Exudate Type: [3:red, brown] [4:red, brown] [N/A:N/A] Exudate Color: [3:Flat and Intact] [4:Distinct, outline attached] [N/A:N/A] Wound Margin: [3:Large (67-100%)] [4:Large (67-100%)] [N/A:N/A] Granulation Amount: [3:Red] [4:Red] [N/A:N/A] Granulation Quality: [3:Small (1-33%)] [4:Small (1-33%)] [N/A:N/A] Necrotic Amount: [3:Fat Layer (Subcutaneous Tissue): Yes Fat Layer (Subcutaneous Tissue): Yes N/A] Exposed Structures: [3:Fascia: No Tendon: No Muscle: No Joint: No Bone: No Small (1-33%)] [4:Fascia: No Tendon: No Muscle: No Joint: No Bone: No Small (1-33%)] [N/A:N/A] Epithelialization: [3:Compression Therapy] [4:Compression Therapy] [N/A:N/A] Treatment Notes Electronic Signature(s) Signed: 08/28/2020 12:33:40 PM By: Geralyn CorwinHoffman, Jessica DO Signed: 08/28/2020 5:27:53 PM By: Zandra AbtsLynch, Shatara RN, BSN Entered By: Geralyn CorwinHoffman, Jessica on 08/28/2020  12:25:16 -------------------------------------------------------------------------------- Multi-Disciplinary Care Plan Details Patient Name: Date of Service: Jamie BrinkWILSO N, Jamie GaveMA [name] 08/28/2020 10:30 A M Medical Record Number: [id number] Patient Account Number: [id number] Date of Birth/Sex: Treating RN: 07/08/1940 (80 y.o. Jamie LinkF) Lynch, Jamie [name] Primary Care Alyanah Elliott: Hiram GashEGGLESTO N-CLA RK, Lorenso QuarryV A LENICA Other Clinician: Referring Windsor Zirkelbach: Treating Paras Kreider/Extender: Olive BassHoffman, Jessica EGGLESTO N-CLA RK, V A LENICA Weeks in Treatment: 8 Active Inactive Venous Leg Ulcer Nursing Diagnoses: Actual venous Insuffiency (use after diagnosis is confirmed) Goals: Patient will maintain optimal edema control Date Initiated: 06/30/2020 Target Resolution Date: 09/22/2020 Goal Status: Active Interventions: Assess peripheral edema status every visit. Compression as ordered Notes: Wound/Skin Impairment Nursing Diagnoses: Impaired tissue integrity Goals: Patient/caregiver will verbalize understanding of skin care regimen Date Initiated: 06/30/2020 Target Resolution Date: 09/22/2020 Goal Status: Active Ulcer/skin breakdown will have a volume reduction of 30% by week 4 Date Initiated: 06/30/2020 Date Inactivated: 08/07/2020 Target Resolution Date: 08/04/2020 Goal Status: Unmet Unmet Reason: non viable surface Interventions: Assess patient/caregiver ability to obtain necessary supplies Assess patient/caregiver ability to perform ulcer/skin care regimen upon admission and as needed Assess ulceration(s) every visit Provide education on ulcer and skin care Treatment Activities: Skin care regimen initiated : 06/30/2020 Topical wound  management initiated : 06/30/2020 Notes: Electronic Signature(s) Signed: 08/28/2020 5:27:53 PM By: Zandra Abts RN, BSN Entered By: Zandra Abts on 08/28/2020 12:10:47 -------------------------------------------------------------------------------- Pain Assessment Details Patient  Name: Date of Service: Jamie [name], Jamie [name] 08/28/2020 10:30 A M Medical Record Number: [id number] Patient Account Number: [id number] Date of Birth/Sex: Treating RN: 13-May-1940 (80 y.o. Jamie [name], Jamie [name] Primary Care Kylieann Eagles: Hiram Gash RK, Lorenso Quarry Other Clinician: Referring Eilish Mcdaniel: Treating Gladie Gravette/Extender: Olive Bass N-CLA RK, V A LENICA Weeks in Treatment: 8 Active Problems Location of Pain Severity and Description of Pain Patient Has Paino No Site Locations Pain Management and Medication Current Pain Management: Electronic Signature(s) Signed: 08/28/2020 5:30:27 PM By: Fonnie Mu RN Entered By: Fonnie Mu on 08/28/2020 11:08:38 -------------------------------------------------------------------------------- Patient/Caregiver Education Details Patient Name: Date of Service: Jamie [name] 5/23/2022andnbsp10:30 A M Medical Record Number: [id number] Patient Account Number: [id number] Date of Birth/Gender: Treating RN: 04/29/1940 (80 y.o. Jamie [name] Primary Care Physician: Hiram Gash RK, Lorenso Quarry Other Clinician: Referring Physician: Treating Physician/Extender: Olive Bass N-CLA RK, V A LENICA Weeks in Treatment: 8 Education Assessment Education Provided To: Patient Education Topics Provided Wound/Skin Impairment: Methods: Explain/Verbal Responses: State content correctly Electronic Signature(s) Signed: 08/28/2020 5:27:53 PM By: Zandra Abts RN, BSN Entered By: Zandra Abts on 08/28/2020 12:10:58 -------------------------------------------------------------------------------- Wound Assessment Details Patient Name: Date of Service: Jamie [name] W. 08/28/2020 10:30 A M Medical Record Number: [id number] Patient Account Number: [id number] Date of Birth/Sex: Treating RN: Aug 10, 1940 (80 y.o. Jamie [name], Jamie [name] Primary Care Areli Frary: Hiram Gash RK, Lorenso Quarry Other  Clinician: Referring Essam Lowdermilk: Treating Latavius Capizzi/Extender: Olive Bass N-CLA RK, V A LENICA Weeks in Treatment: 8 Wound Status Wound Number: 3 Primary Venous Leg Ulcer Etiology: Wound Location: Left, Distal, Medial Lower Leg Wound Open Wounding Event: Gradually Appeared Status: Date Acquired: 04/09/2019 Comorbid Sleep Apnea, Hypertension, Peripheral Venous Disease, Weeks Of Treatment: 8 History: Osteoarthritis, Neuropathy Clustered Wound: No Photos Wound Measurements Length: (cm) 8.4 Width: (cm) 7 Depth: (cm) 0.2 Area: (cm) 46.181 Volume: (cm) 9.236 % Reduction in Area: -551.9% % Reduction in Volume: -551.8% Epithelialization: Small (1-33%) Tunneling: No Undermining: No Wound Description Classification: Full Thickness Without Exposed Support Structures Wound Margin: Flat and Intact Exudate Amount: Large Exudate Type: Serosanguineous Exudate Color: red, brown Foul Odor After Cleansing: No Slough/Fibrino Yes Wound Bed Granulation Amount: Large (67-100%) Exposed Structure Granulation Quality: Red Fascia Exposed: No Necrotic Amount: Small (1-33%) Fat Layer (Subcutaneous Tissue) Exposed: Yes Necrotic Quality: Adherent Slough Tendon Exposed: No Muscle Exposed: No Joint Exposed: No Bone Exposed: No Treatment Notes Wound #3 (Lower Leg) Wound Laterality: Left, Medial, Distal Cleanser Wound Cleanser Discharge Instruction: Cleanse the wound with wound cleanser prior to applying a clean dressing using gauze sponges, not tissue or cotton balls. Soap and Water Discharge Instruction: May shower and wash wound with dial antibacterial soap and water prior to dressing change. Peri-Wound Care Triamcinolone 15 (g) Discharge Instruction: In clinic only. Apply to red, irritated periwound Sween Lotion (Moisturizing lotion) Discharge Instruction: apply at home. Apply moisturizing lotion as directed Topical Keystone Antibiotic Compound Discharge Instruction: Apply  directly to wound bed under Sorbact Primary Dressing Cutimed Sorbact Swab Discharge Instruction: Apply to wound over antibiotic spray Secondary Dressing Woven Gauze Sponge, Non-Sterile 4x4 in Discharge Instruction: Apply over primary dressing as directed. ABD Pad, 8x10 Discharge Instruction: Apply over primary dressing as directed. CarboFLEX Odor Control Dressing, 4x4 in Discharge Instruction: Apply over primary dressing in office only Secured With Compression Wrap ThreePress (  3 layer compression wrap) Discharge Instruction: Apply three layer compression as directed. Compression Stockings Add-Ons Electronic Signature(s) Signed: 08/28/2020 5:06:59 PM By: Karl Ito Signed: 08/28/2020 5:30:27 PM By: Fonnie Mu RN Entered By: Karl Ito on 08/28/2020 16:47:57 -------------------------------------------------------------------------------- Wound Assessment Details Patient Name: Date of Service: Jamie [name], Jamie [name] 08/28/2020 10:30 A M Medical Record Number: [id number] Patient Account Number: [id number] Date of Birth/Sex: Treating RN: 09/06/1940 (80 y.o. Jamie [name], Jamie [name] Primary Care Ermie Glendenning: Hiram Gash RK, Lorenso Quarry Other Clinician: Referring Matthieu Loftus: Treating Iyona Pehrson/Extender: Olive Bass N-CLA RK, V A LENICA Weeks in Treatment: 8 Wound Status Wound Number: 4 Primary Venous Leg Ulcer Etiology: Wound Location: Left, Posterior Lower Leg Wound Open Wounding Event: Gradually Appeared Status: Date Acquired: 04/09/2019 Comorbid Sleep Apnea, Hypertension, Peripheral Venous Disease, Weeks Of Treatment: 8 History: Osteoarthritis, Neuropathy Clustered Wound: No Photos Wound Measurements Length: (cm) 2 Width: (cm) 2 Depth: (cm) 0.2 Area: (cm) 3.142 Volume: (cm) 0.628 % Reduction in Area: -300.3% % Reduction in Volume: -300% Epithelialization: Small (1-33%) Tunneling: No Undermining: No Wound Description Classification: Full  Thickness Without Exposed Support Structures Wound Margin: Distinct, outline attached Exudate Amount: Medium Exudate Type: Serosanguineous Exudate Color: red, brown Foul Odor After Cleansing: No Slough/Fibrino Yes Wound Bed Granulation Amount: Large (67-100%) Exposed Structure Granulation Quality: Red Fascia Exposed: No Necrotic Amount: Small (1-33%) Fat Layer (Subcutaneous Tissue) Exposed: Yes Necrotic Quality: Adherent Slough Tendon Exposed: No Muscle Exposed: No Joint Exposed: No Bone Exposed: No Treatment Notes Wound #4 (Lower Leg) Wound Laterality: Left, Posterior Cleanser Wound Cleanser Discharge Instruction: Cleanse the wound with wound cleanser prior to applying a clean dressing using gauze sponges, not tissue or cotton balls. Soap and Water Discharge Instruction: May shower and wash wound with dial antibacterial soap and water prior to dressing change. Peri-Wound Care Triamcinolone 15 (g) Discharge Instruction: In clinic only. Apply to red, irritated periwound Sween Lotion (Moisturizing lotion) Discharge Instruction: apply at home. Apply moisturizing lotion as directed Topical Keystone Antibiotic Compound Discharge Instruction: Apply directly to wound bed under Sorbact Primary Dressing Cutimed Sorbact Swab Discharge Instruction: Apply to wound over antibiotic spray Secondary Dressing Woven Gauze Sponge, Non-Sterile 4x4 in Discharge Instruction: Apply over primary dressing as directed. ABD Pad, 8x10 Discharge Instruction: Apply over primary dressing as directed. CarboFLEX Odor Control Dressing, 4x4 in Discharge Instruction: Apply over primary dressing in office only Secured With Compression Wrap ThreePress (3 layer compression wrap) Discharge Instruction: Apply three layer compression as directed. Compression Stockings Add-Ons Electronic Signature(s) Signed: 08/28/2020 5:06:59 PM By: Karl Ito Signed: 08/28/2020 5:30:27 PM By: Fonnie Mu  RN Entered By: Karl Ito on 08/28/2020 16:47:38 -------------------------------------------------------------------------------- Vitals Details Patient Name: Date of Service: Jamie Brink, MA [name] 08/28/2020 10:30 A M Medical Record Number: [id number] Patient Account Number: [id number] Date of Birth/Sex: Treating RN: 1940-12-12 (80 y.o. Jamie [name], Jamie [name] Primary Care Nailah Luepke: Hiram Gash RK, Lorenso Quarry Other Clinician: Referring Athalene Kolle: Treating Katiana Ruland/Extender: Olive Bass N-CLA RK, V A LENICA Weeks in Treatment: 8 Vital Signs Time Taken: 11:08 Temperature (F): 98.4 Height (in): 63 Pulse (bpm): 74 Weight (lbs): 150 Respiratory Rate (breaths/min): 17 Body Mass Index (BMI): 26.6 Blood Pressure (mmHg): 158/89 Reference Range: 80 - 120 mg / dl Electronic Signature(s) Signed: 08/28/2020 5:30:27 PM By: Fonnie Mu RN Entered By: Fonnie Mu on 08/28/2020 11:08:30

## 2020-09-08 ENCOUNTER — Other Ambulatory Visit: Payer: Self-pay

## 2020-09-08 DIAGNOSIS — R0989 Other specified symptoms and signs involving the circulatory and respiratory systems: Secondary | ICD-10-CM | POA: Insufficient documentation

## 2020-09-08 DIAGNOSIS — L97929 Non-pressure chronic ulcer of unspecified part of left lower leg with unspecified severity: Secondary | ICD-10-CM | POA: Insufficient documentation

## 2020-09-08 DIAGNOSIS — I83029 Varicose veins of left lower extremity with ulcer of unspecified site: Secondary | ICD-10-CM | POA: Insufficient documentation

## 2020-09-11 ENCOUNTER — Encounter (HOSPITAL_BASED_OUTPATIENT_CLINIC_OR_DEPARTMENT_OTHER): Payer: Medicare Other | Attending: Internal Medicine | Admitting: Internal Medicine

## 2020-09-11 ENCOUNTER — Other Ambulatory Visit: Payer: Self-pay

## 2020-09-11 DIAGNOSIS — Z8614 Personal history of Methicillin resistant Staphylococcus aureus infection: Secondary | ICD-10-CM | POA: Insufficient documentation

## 2020-09-11 DIAGNOSIS — L97922 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: Secondary | ICD-10-CM | POA: Insufficient documentation

## 2020-09-11 NOTE — Progress Notes (Signed)
SYBEL, STANDISH ([id number]) Visit Report for 09/11/2020 HPI Details Patient Name: Date of Service: Jamie [name], [name]. 09/11/2020 11:00 A M Medical Record Number: [id number] Patient Account Number: [id number] Date of Birth/Sex: Treating RN: 1941-03-03 (80 y.o. [name] Primary Care Provider: [name] Other Clinician: Referring Provider: Treating Provider/Extender: Vivia Budge in Treatment: 10 History of Present Illness HPI Description: ADMISSION 06/30/2020 Jamie [name] is an 80 year old woman who lives in [place]. She is here with her niece for review of wounds on the left medial lower leg and ankle. These have apparently been present for over a year and she followed with Dr. Olegario Messier at the [facility] in [place] for quite a period of time although it looks as though there was a initial consult wound from Dr. Marcha Solders on February 18 presumably there was therefore hiatus. At that point the wounds were described as being there for 3 months. She also tells me she was at the wound care center in [place] for a period of time with this. 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 She had venous studies that were negative for DVT ABIs on the right were 1.01 on the left 1.06. She has . had previous applications of puraply, compression which she does not tolerate very well. She has had several rounds of oral antibiotic therapy. She complains of unrelenting pain and she is seeing Dr. Reece Agar V of pain management apparently was on oxycodone but that did not help. She also had a skin biopsy done by Dr. Olegario Messier although we do not have that result. She does not appear to have an arterial issue. I am not completely clear what she has been putting on the wounds lately. 3/31; this patient has a particularly nasty set of wounds on the left medial ankle in the middle of what looks to be hemosiderin  deposition secondary to chronic venous insufficiency. She has a lot of pain followed by pain management. Dr. Marcha Solders apparently did a biopsy of something on the left leg last fall what I would like to get this result. She has a history of MRSA treatment. I do not believe she had reflux studies but she did have DVT rule out studies. Previous ABIs have not suggested arterial insufficiency 4/7; difficult wounds on the left medial ankle probably chronic venous insufficiency. With considerable effort on behalf of our case manager we were able to finally to speak to somebody at the hospital in [place] who indicated that no biopsy of this area have been done even though the patient describes this in some detail and is even able to point out where she thinks the biopsy was done. We have been using Sorbact. The PCR culture I did showed polymicrobial identification with Pseudomonas, staph aureus, Peptostreptococcus. All of this and low titers. Resistance genes identified were MRSA, staph virulence gene and tetracycline. We are going to send this to [facility] for a topical antibiotic which is something that we have had good success with recently in large venous ulcers with a lot of purulent drainage. There would not be an easy oral alternative here possibly line escalated and ciprofloxacin if we need to use systemic antibiotic 4/15; difficult area on the left medial ankle. Most likely chronic venous insufficiency. I think she will probably need venous reflux study I think she had DVT rule outs but not venous reflux studies. We have not yet obtained the topical antibiotics. She has home health changing  the dressing we have been using Sorbact for adherent fibrinous debris on the surface. Very difficult to remove 4/22; patient presents for 1 week follow-up. She has been using sore back under compression wraps and these are changed 3 times a week with home health. She also had Keystone antibiotics sent to her house and  brought them in today. She has no complaints or issues today. 5/2; patient is here for follow-up. She has been using Sorbact under compression. Very painful wound. She has been using Keystone antibiotics. Not much improvement although the more medial part of the wound has cleaned up nicely and the larger part of the wound about 50% slough covered. Part of the issue here is that she had stays at both [facility], [facility] and now us. Not sure if she has had venous reflux studies. As far as we are able to tell she did not have a biopsy. My notes state that she did not have venous reflux studies just DVT rule outs. 5/16; patient goes for venous reflux studies this afternoon. She says that wound was biopsied which sounds like punch biopsies by Dr. Lynden Angathy we do not have these results. We are using Sorbact. Very difficult wound to debride 5/23; patient presents for 1 week follow-up. She reports tolerating the wraps well with sorbact underneath. She had ABIs and venous reflux studies done. She has no issues or complaints today. She denies acute signs of infection. 6/6; I have reviewed the patient's vascular studies. Wounds are on the left medial and posterior calf. She had significant reflux in the greater saphenous vein in the in the mid thigh, distal thigh knee and the small saphenous vein in the popliteal fossa. The vein diameters do not look too impressive though. She is going to see the vascular surgeon on Wednesday. She also had venous reflux in the right common femoral vein. She did not have any evidence of a DVT or SVT I am wondering whether there is an ablation procedure that would benefit her in the greater saphenous vein on the left. . She tells us that home health put the dressing on too tight and she took off 1 layer. The swelling in her left leg is a little worse as a result of this. Not much change in the wound measurements so the surface of the wound looks  better Her arterial studies showed an ABI on the right of 1.14 with a triphasic waveform and a great toe pressure of 0.89. On the left her ABIs were noncompressible at 1.34 but with triphasic waveforms and a TBI of 0.97. Her greater toe pressure was 122. There was no evidence of significant bilateral arterial disease Electronic Signature(s) Signed: 09/11/2020 4:50:54 PM By: Baltazar Najjarobson, Barkley Kratochvil MD Entered By: Baltazar Najjarobson, Kessa Fairbairn on 09/11/2020 11:36:16 -------------------------------------------------------------------------------- Physical Exam Details Patient Name: Date of Service: Jamie BrinkWILSO N, Jamie GaveMA [name] 09/11/2020 11:00 A M Medical Record Number: [id number] Patient Account Number: [id number] Date of Birth/Sex: Treating RN: 07/06/1940 (80 y.o. Wynelle LinkF) [name], Jamie [name] Primary Care Provider: Delorise JacksonEggleston-Clark, [name] Other Clinician: Referring Provider: Treating Provider/Extender: Vivia Budgeobson, Twylla Arceneaux Eggleston-Clark, [name] in Treatment: 10 Constitutional Patient is hypertensive.. Pulse regular and within target range for patient.Marland Kitchen. Respirations regular, non-labored and within target range.. Temperature is normal and within the target range for the patient.Marland Kitchen. Appears in no distress. Notes Wound exam; left lower extremity the medial part of the left calf extending anteriorly. Also a small area posteriorly. She has a minimal amount of nonviable tissue around the edges.  I did not debride this today but further debridement may be necessary. She has significant chronic venous insufficiency likely with some degree of stasis dermatitis especially on the medial lower leg Electronic Signature(s) Signed: 09/11/2020 4:50:54 PM By: [name] MD Entered By: [name] on 09/11/2020 11:37:16 -------------------------------------------------------------------------------- Physician Orders Details Patient Name: Date of Service: Jamie [name], Jamie [name] 09/11/2020 11:00 A M Medical Record Number: [id number] Patient  Account Number: [id number] Date of Birth/Sex: Treating RN: 22-Sep-1940 (80 y.o. [name] Primary Care Provider: [name] Other Clinician: Referring Provider: Treating Provider/Extender: Vivia Budge in Treatment: 10 Verbal / Phone Orders: No Diagnosis Coding Follow-up Appointments ppointment in 2 weeks. - with Dr. Leanord Hawking Return A Cellular or Tissue Based Products Cellular or Tissue Based Product Type: - Run IVR for Apligraft Bathing/ Shower/ Hygiene May shower with protection but do not get wound dressing(s) wet. Edema Control - Lymphedema / SCD / Other Elevate legs to the level of the heart or above for 30 minutes daily and/or when sitting, a frequency of: - throughout the day 3-4 times a day. Avoid standing for long periods of time. Exercise regularly Additional Orders / Instructions Follow Nutritious Diet Home Health No change in wound care orders this week; continue Home Health for wound care. May utilize formulary equivalent dressing for wound treatment orders unless otherwise specified. - ***DO NOT USE HYDROGEL SHEET - ONLY USE PLAIN HYDROGEL OR [place] JELLY*** Other Home Health Orders/Instructions: - [facility] fax #[phone] Wound Treatment Wound #3 - Lower Leg Wound Laterality: Left, Medial, Distal Cleanser: Soap and Water (Home Health) 2 x Per Week/30 Days Discharge Instructions: May shower and wash wound with dial antibacterial soap and water prior to dressing change. Cleanser: Wound Cleanser (Home Health) 2 x Per Week/30 Days Discharge Instructions: Cleanse the wound with wound cleanser prior to applying a clean dressing using gauze sponges, not tissue or cotton balls. Peri-Wound Care: Triamcinolone 15 (g) 2 x Per Week/30 Days Discharge Instructions: In clinic only. Apply to red, irritated periwound Peri-Wound Care: Sween Lotion (Moisturizing lotion) (Home Health) 2 x Per Week/30  Days Discharge Instructions: apply at home. Apply moisturizing lotion as directed Topical: Keystone Antibiotic Compound (Home Health) 2 x Per Week/30 Days Discharge Instructions: Apply directly to wound bed under Sorbact Prim Dressing: Promogran Prisma Matrix, 4.34 (sq in) (silver collagen) (Home Health) 2 x Per Week/30 Days ary Discharge Instructions: Moisten collagen with saline or hydrogel Secondary Dressing: Woven Gauze Sponge, Non-Sterile 4x4 in (Home Health) 2 x Per Week/30 Days Discharge Instructions: Apply over primary dressing as directed. Secondary Dressing: ABD Pad, 8x10 (Home Health) 2 x Per Week/30 Days Discharge Instructions: Apply over primary dressing as directed. Secondary Dressing: CarboFLEX Odor Control Dressing, 4x4 in (Home Health) 2 x Per Week/30 Days Discharge Instructions: Apply over primary dressing in office only Compression Wrap: ThreePress (3 layer compression wrap) 2 x Per Week/30 Days Discharge Instructions: Apply three layer compression as directed. Wound #4 - Lower Leg Wound Laterality: Left, Posterior Cleanser: Soap and Water (Home Health) 2 x Per Week/30 Days Discharge Instructions: May shower and wash wound with dial antibacterial soap and water prior to dressing change. Cleanser: Wound Cleanser (Home Health) 2 x Per Week/30 Days Discharge Instructions: Cleanse the wound with wound cleanser prior to applying a clean dressing using gauze sponges, not tissue or cotton balls. Peri-Wound Care: Triamcinolone 15 (g) 2 x Per Week/30 Days Discharge Instructions: In clinic only. Apply to red, irritated periwound Peri-Wound Care: Donnal Moat (  Moisturizing lotion) (Home Health) 2 x Per Week/30 Days Discharge Instructions: apply at home. Apply moisturizing lotion as directed Topical: Keystone Antibiotic Compound (Home Health) 2 x Per Week/30 Days Discharge Instructions: Apply directly to wound bed under Sorbact Prim Dressing: Promogran Prisma Matrix, 4.34 (sq in)  (silver collagen) (Home Health) 2 x Per Week/30 Days ary Discharge Instructions: Moisten collagen with saline or hydrogel Secondary Dressing: Woven Gauze Sponge, Non-Sterile 4x4 in (Home Health) 2 x Per Week/30 Days Discharge Instructions: Apply over primary dressing as directed. Secondary Dressing: ABD Pad, 8x10 (Home Health) 2 x Per Week/30 Days Discharge Instructions: Apply over primary dressing as directed. Secondary Dressing: CarboFLEX Odor Control Dressing, 4x4 in (Home Health) 2 x Per Week/30 Days Discharge Instructions: Apply over primary dressing in office only Compression Wrap: ThreePress (3 layer compression wrap) 2 x Per Week/30 Days Discharge Instructions: Apply three layer compression as directed. Electronic Signature(s) Signed: 09/11/2020 4:50:54 PM By: [name] MD Signed: 09/11/2020 5:37:32 PM By: Fonnie Mu RN Entered By: Fonnie Mu on 09/11/2020 11:18:24 -------------------------------------------------------------------------------- Problem List Details Patient Name: Date of Service: Jamie [name], Jamie [name] 09/11/2020 11:00 A M Medical Record Number: [id number] Patient Account Number: [id number] Date of Birth/Sex: Treating RN: [birth year] (80 y.o. 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 09/11/2020 11:00 A M Medical Record Number: [id number] Patient Account Number: [id number] Date of Birth/Sex: Treating RN: [birth year] (80 y.o. [name] Primary Care Provider: [name] Other Clinician: Referring Provider: Treating Provider/Extender: [name] Weeks in Treatment: 10 Subjective History of Present Illness (HPI) ADMISSION 06/30/2020 Mrs. Melder is an 80 year old woman who lives in [place]. She is here with her niece for review of wounds on the left medial lower leg and ankle. These have apparently been present for over a year and she followed with Dr. Olegario Messier at the [facility] in [place] for quite a period of time although it looks as though there was a initial consult wound from Dr. Marcha Solders on February 18 presumably there was therefore hiatus. At that point the wounds were described as being there for 3 months. She also tells me she was at the wound care center in [place] for a period of time with this. 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 She had venous studies that were negative for DVT ABIs on the right were 1.01 on the left 1.06. She has . had previous applications of puraply, compression which she does not tolerate very well. She has had several rounds of oral antibiotic therapy. She complains of unrelenting pain and she is seeing Dr. Reece Agar V of pain management apparently was on oxycodone but that did not help. She also had a skin biopsy done by Dr. Olegario Messier although we do not have that result. She does not appear to have an arterial issue. I am not completely clear what she has been putting on the wounds lately. 3/31; this patient  has a particularly nasty set of wounds on the left medial ankle in  the middle of what looks to be hemosiderin deposition secondary to chronic venous insufficiency. She has a lot of pain followed by pain management. Dr. Marcha Solders apparently did a biopsy of something on the left leg last fall what I would like to get this result. She has a history of MRSA treatment. I do not believe she had reflux studies but she did have DVT rule out studies. Previous ABIs have not suggested arterial insufficiency 4/7; difficult wounds on the left medial ankle probably chronic venous insufficiency. With considerable effort on behalf of our case manager we were able to finally to speak to somebody at the hospital in [place] who indicated that no biopsy of this area have been done even though the patient describes this in some detail and is even able to point out where she thinks the biopsy was done. We have been using Sorbact. The PCR culture I did showed polymicrobial identification with Pseudomonas, staph aureus, Peptostreptococcus. All of this and low titers. Resistance genes identified were MRSA, staph virulence gene and tetracycline. We are going to send this to [facility] for a topical antibiotic which is something that we have had good success with recently in large venous ulcers with a lot of purulent drainage. There would not be an easy oral alternative here possibly line escalated and ciprofloxacin if we need to use systemic antibiotic 4/15; difficult area on the left medial ankle. Most likely chronic venous insufficiency. I think she will probably need venous reflux study I think she had DVT rule outs but not venous reflux studies. We have not yet obtained the topical antibiotics. She has home health changing the dressing we have been using Sorbact for adherent fibrinous debris on the surface. Very difficult to remove 4/22; patient presents for 1 week follow-up. She has been using sore back under compression wraps and these are changed 3 times a week with home health. She also had  Keystone antibiotics sent to her house and brought them in today. She has no complaints or issues today. 5/2; patient is here for follow-up. She has been using Sorbact under compression. Very painful wound. She has been using Keystone antibiotics. Not much improvement although the more medial part of the wound has cleaned up nicely and the larger part of the wound about 50% slough covered. Part of the issue here is that she had stays at both [facility] wound care center, [facility] wound care center and now [place]. Not sure if she has had venous reflux studies. As far as we are able to tell she did not have a biopsy. My notes state that she did not have venous reflux studies just DVT rule outs. 5/16; patient goes for venous reflux studies this afternoon. She says that wound was biopsied which sounds like punch biopsies by Dr. Lynden Ang we do not have these results. We are using Sorbact. Very difficult wound to debride 5/23; patient presents for 1 week follow-up. She reports tolerating the wraps well with sorbact underneath. She had ABIs and venous reflux studies done. She has no issues or complaints today. She denies acute signs of infection. 6/6; I have reviewed the patient's vascular studies. Wounds are on the left medial and posterior calf. She had significant reflux in the greater saphenous vein in the in the mid thigh, distal thigh knee and the small saphenous vein in the popliteal fossa. The vein diameters do not look too impressive though. She is  going to see the vascular surgeon on Wednesday. She also had venous reflux in the right common femoral vein. She did not have any evidence of a DVT or SVT I am wondering whether there is an ablation procedure that would benefit her in the greater saphenous vein on the left. . She tells [place] that home health put the dressing on too tight and she took off 1 layer. The swelling in her left leg is a little worse as a result of this. Not much change in the wound  measurements so the surface of the wound looks better Her arterial studies showed an ABI on the right of 1.14 with a triphasic waveform and a great toe pressure of 0.89. On the left her ABIs were noncompressible at 1.34 but with triphasic waveforms and a TBI of 0.97. Her greater toe pressure was 122. There was no evidence of significant bilateral arterial disease Objective Constitutional Patient is hypertensive.. Pulse regular and within target range for patient.Marland Kitchen Respirations regular, non-labored and within target range.. Temperature is normal and within the target range for the patient.Marland Kitchen Appears in no distress. Vitals Time Taken: 11:01 AM, Height: 63 in, Weight: 150 lbs, BMI: 26.6, Temperature: 98.1 F, Pulse: 84 bpm, Respiratory Rate: 17 breaths/min, Blood Pressure: 147/95 mmHg. General Notes: Wound exam; left lower extremity the medial part of the left calf extending anteriorly. Also a small area posteriorly. She has a minimal amount of nonviable tissue around the edges. I did not debride this today but further debridement may be necessary. She has significant chronic venous insufficiency likely with some degree of stasis dermatitis especially on the medial lower leg Integumentary (Hair, Skin) Wound #3 status is Open. Original cause of wound was Gradually Appeared. The date acquired was: 04/09/2019. The wound has been in treatment 10 weeks. The wound is located on the Left,Distal,Medial Lower Leg. The wound measures 8.4cm length x 6.8cm width x 0.1cm depth; 44.862cm^2 area and 4.486cm^3 volume. There is Fat Layer (Subcutaneous Tissue) exposed. There is no tunneling or undermining noted. There is a large amount of serosanguineous drainage noted. The wound margin is flat and intact. There is large (67-100%) red granulation within the wound bed. There is a small (1-33%) amount of necrotic tissue within the wound bed including Adherent Slough. Wound #4 status is Open. Original cause of wound was  Gradually Appeared. The date acquired was: 04/09/2019. The wound has been in treatment 10 weeks. The wound is located on the Left,Posterior Lower Leg. The wound measures 1.9cm length x 2.2cm width x 0.1cm depth; 3.283cm^2 area and 0.328cm^3 volume. There is Fat Layer (Subcutaneous Tissue) exposed. There is no tunneling or undermining noted. There is a medium amount of serosanguineous drainage noted. The wound margin is distinct with the outline attached to the wound base. There is large (67-100%) red granulation within the wound bed. There is a small (1-33%) amount of necrotic tissue within the wound bed including Adherent Slough. 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 There was a Three Layer Compression Therapy Procedure by Fonnie Mu, RN. Post procedure Diagnosis Wound #3: Same as Pre-Procedure Wound #4 Pre-procedure diagnosis of Wound #4 is a Venous Leg Ulcer located on the Left,Posterior Lower Leg . There was a Three Layer Compression Therapy Procedure  by Fonnie Mu, RN. Post procedure Diagnosis Wound #4: Same as Pre-Procedure Plan Follow-up Appointments: Return Appointment in 2 weeks. - with Dr. Leanord Hawking Cellular or Tissue Based Products: Cellular or Tissue Based Product Type: - Run IVR for Apligraft Bathing/ Shower/ Hygiene: May shower with protection but do not get wound dressing(s) wet. Edema Control - Lymphedema / SCD / Other: Elevate legs to the level of the heart or above for 30 minutes daily and/or when sitting, a frequency of: - throughout the day 3-4 times a day. Avoid standing for long periods of time. Exercise regularly Additional Orders / Instructions: Follow  Nutritious Diet Home Health: No change in wound care orders this week; continue Home Health for wound care. May utilize formulary equivalent dressing for wound treatment orders unless otherwise specified. - ***DO NOT USE HYDROGEL SHEET - ONLY USE PLAIN HYDROGEL OR [place] JELLY*** Other Home Health Orders/Instructions: - [facility] fax #[phone] WOUND #3: - Lower Leg Wound Laterality: Left, Medial, Distal Cleanser: Soap and Water (Home Health) 2 x Per Week/30 Days Discharge Instructions: May shower and wash wound with dial antibacterial soap and water prior to dressing change. Cleanser: Wound Cleanser (Home Health) 2 x Per Week/30 Days Discharge Instructions: Cleanse the wound with wound cleanser prior to applying a clean dressing using gauze sponges, not tissue or cotton balls. Peri-Wound Care: Triamcinolone 15 (g) 2 x Per Week/30 Days Discharge Instructions: In clinic only. Apply to red, irritated periwound Peri-Wound Care: Sween Lotion (Moisturizing lotion) (Home Health) 2 x Per Week/30 Days Discharge Instructions: apply at home. Apply moisturizing lotion as directed Topical: Keystone Antibiotic Compound (Home Health) 2 x Per Week/30 Days Discharge Instructions: Apply directly to wound bed under Sorbact Prim Dressing: Promogran Prisma Matrix, 4.34 (sq in) (silver collagen) (Home Health) 2 x Per Week/30 Days ary Discharge Instructions: Moisten collagen with saline or hydrogel Secondary Dressing: Woven Gauze Sponge, Non-Sterile 4x4 in (Home Health) 2 x Per Week/30 Days Discharge Instructions: Apply over primary dressing as directed. Secondary Dressing: ABD Pad, 8x10 (Home Health) 2 x Per Week/30 Days Discharge Instructions: Apply over primary dressing as directed. Secondary Dressing: CarboFLEX Odor Control Dressing, 4x4 in (Home Health) 2 x Per Week/30 Days Discharge Instructions: Apply over primary dressing in office only Com pression Wrap: ThreePress (3 layer  compression wrap) 2 x Per Week/30 Days Discharge Instructions: Apply three layer compression as directed. WOUND #4: - Lower Leg Wound Laterality: Left, Posterior Cleanser: Soap and Water (Home Health) 2 x Per Week/30 Days Discharge Instructions: May shower and wash wound with dial antibacterial soap and water prior to dressing change. Cleanser: Wound Cleanser (Home Health) 2 x Per Week/30 Days Discharge Instructions: Cleanse the wound with wound cleanser prior to applying a clean dressing using gauze sponges, not tissue or cotton balls. Peri-Wound Care: Triamcinolone 15 (g) 2 x Per Week/30 Days Discharge Instructions: In clinic only. Apply to red, irritated periwound Peri-Wound Care: Sween Lotion (Moisturizing lotion) (Home Health) 2 x Per Week/30 Days Discharge Instructions: apply at home. Apply moisturizing lotion as directed Topical: Keystone Antibiotic Compound (Home Health) 2 x Per Week/30 Days Discharge Instructions: Apply directly to wound bed under Sorbact Prim Dressing: Promogran Prisma Matrix, 4.34 (sq in) (silver collagen) (Home Health) 2 x Per Week/30 Days ary Discharge Instructions: Moisten collagen with saline or hydrogel Secondary Dressing: Woven Gauze Sponge, Non-Sterile 4x4 in (Home Health) 2 x Per Week/30 Days Discharge Instructions: Apply over primary dressing as directed. Secondary Dressing: ABD Pad, 8x10 (Home Health) 2 x Per Week/30 Days  Discharge Instructions: Apply over primary dressing as directed. Secondary Dressing: CarboFLEX Odor Control Dressing, 4x4 in (Home Health) 2 x Per Week/30 Days Discharge Instructions: Apply over primary dressing in office only Com pression Wrap: ThreePress (3 layer compression wrap) 2 x Per Week/30 Days Discharge Instructions: Apply three layer compression as directed. 1. I changed her primary dressing from Sorbact to collagen. Hopefully to stimulate some granulation 2. Further mechanical debridement of the adherent debris on the edges  may be necessary. 3. She does not have significant arterial disease 4. She sees vascular surgery with regards to possible ablations on the left although her calf diameters did not look too large to me. I wonder if she is going to be a candidate 5. I have put Apligraf through her insurance. 6. I put TCA in the stasis dermatitis around the wound edge Electronic Signature(s) Signed: 09/11/2020 4:50:54 PM By: [name] MD Entered By: [name] on 09/11/2020 11:38:38 -------------------------------------------------------------------------------- SuperBill Details Patient Name: Date of Service: Jamie [name], Christean Grief. 09/11/2020 Medical Record Number: [id number] Patient Account Number: [id number] Date of Birth/Sex: Treating RN: [birth year] (80 y.o. 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 Facility Procedures CPT4 Code: [id number] Description: (Facility Use Only) [phone] - APPLY MULTLAY COMPRS LWR LT LEG Modifier: Quantity: 1 Physician Procedures : CPT4 Code Description Modifier [id number] - WC PHYS LEVEL 4 - EST PT ICD-10 Diagnosis Description L97.922 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

## 2020-09-13 ENCOUNTER — Other Ambulatory Visit: Payer: Self-pay

## 2020-09-13 ENCOUNTER — Encounter: Payer: Self-pay | Admitting: Vascular Surgery

## 2020-09-13 ENCOUNTER — Ambulatory Visit (INDEPENDENT_AMBULATORY_CARE_PROVIDER_SITE_OTHER): Payer: Medicare Other | Admitting: Vascular Surgery

## 2020-09-13 VITALS — BP 115/74 | HR 81 | Resp 20 | Ht 63.0 in | Wt 150.0 lb

## 2020-09-13 DIAGNOSIS — I872 Venous insufficiency (chronic) (peripheral): Secondary | ICD-10-CM | POA: Diagnosis not present

## 2020-09-13 NOTE — Progress Notes (Signed)
Jamie [name], Jamie [name] ([id number]) Visit Report for 09/11/2020 Arrival Information Details Patient Name: Date of Service: Jamie [name], [name]. 09/11/2020 11:00 A M Medical Record Number: [id number] Patient Account Number: [id number] Date of Birth/Sex: Treating RN: 09-17-1940 (80 y.o. [name], Lauren Primary Care Joia Doyle: [name] Other Clinician: Referring Aspin Palomarez: Treating Taher Vannote/Extender: Vivia Budge in Treatment: 10 Visit Information History Since Last Visit Added or deleted any medications: No Patient Arrived: Wheel Chair Any new allergies or adverse reactions: No Arrival Time: 10:58 Had a fall or experienced change in No Accompanied By: self activities of daily living that may affect Transfer Assistance: None risk of falls: Patient Identification Verified: Yes Signs or symptoms of abuse/neglect since last visito No Secondary Verification Process Completed: Yes Hospitalized since last visit: No Patient Requires Transmission-Based Precautions: No Implantable device outside of the clinic excluding No Patient Has Alerts: Yes cellular tissue based products placed in the center Patient Alerts: L ABI: 1.06; R ABI: 1.01 since last visit: Has Dressing in Place as Prescribed: Yes Pain Present Now: Yes Electronic Signature(s) Signed: 09/11/2020 5:37:32 PM By: Fonnie Mu RN Entered By: Fonnie Mu on 09/11/2020 11:01:21 -------------------------------------------------------------------------------- Compression Therapy Details Patient Name: Date of Service: Jamie Mart W. 09/11/2020 11:00 A M Medical Record Number: [id number] Patient Account Number: [id number] Date of Birth/Sex: Treating RN: [birth year] (80 y.o. [name] Primary Care Tristan Bramble: [name] Other Clinician: Referring Janina Trafton: Treating Chrystal Zeimet/Extender: Vivia Budge in Treatment:  10 Compression Therapy Performed for Wound Assessment: Wound #3 Left,Distal,Medial Lower Leg Performed By: Clinician Fonnie Mu, RN Compression Type: Three Layer Post Procedure Diagnosis Same as Pre-procedure Electronic Signature(s) Signed: 09/11/2020 5:37:32 PM By: Fonnie Mu RN Entered By: Fonnie Mu on 09/11/2020 11:25:29 -------------------------------------------------------------------------------- Compression Therapy Details Patient Name: Date of Service: Jamie [name], Jamie [name] 09/11/2020 11:00 A M Medical Record Number: [id number] Patient Account Number: [id number] Date of Birth/Sex: Treating RN: 07/20/1940 (80 y.o. [name] Primary Care Karinda Cabriales: [name] Other Clinician: Referring Lazariah Savard: Treating Mcclellan Demarais/Extender: Vivia Budge in Treatment: 10 Compression Therapy Performed for Wound Assessment: Wound #4 Left,Posterior Lower Leg Performed By: Clinician Fonnie Mu, RN Compression Type: Three Layer Post Procedure Diagnosis Same as Pre-procedure Electronic Signature(s) Signed: 09/11/2020 5:37:32 PM By: Fonnie Mu RN Entered By: Fonnie Mu on 09/11/2020 11:25:29 -------------------------------------------------------------------------------- Lower Extremity Assessment Details Patient Name: Date of Service: Jamie Mart W. 09/11/2020 11:00 A M Medical Record Number: [id number] Patient Account Number: [id number] Date of Birth/Sex: Treating RN: 1940-08-12 (80 y.o. [name], Lauren Primary Care Linday Rhodes: [name] Other Clinician: Referring Jalaysia Lobb: Treating Jaleea Alesi/Extender: [name] Weeks in Treatment: 10 Edema Assessment Assessed: [name]: Yes] [Right: No] Edema: [Left: Ye] [Right: s] Calf Left: Right: Point of Measurement: 29 cm From Medial Instep 37 cm Ankle Left: Right: Point of Measurement: 11 cm  From Medial Instep 24.5 cm Vascular Assessment Pulses: Dorsalis Pedis Palpable: [Left:Yes] Posterior Tibial Palpable: [Left:Yes] Electronic Signature(s) Signed: 09/11/2020 5:37:32 PM By: Fonnie Mu RN Entered By: Fonnie Mu on 09/11/2020 11:03:33 -------------------------------------------------------------------------------- Multi Wound Chart Details Patient Name: Date of Service: Jamie [name], Jamie [name] 09/11/2020 11:00 A M Medical Record Number: [id number] Patient Account Number: [id number] Date of Birth/Sex: Treating RN: 13-Feb-1941 (80 y.o. [name] Primary Care Pascual Mantel: [name] Other Clinician: Referring Nikya Busler: Treating Kristion Holifield/Extender: Vivia Budge in Treatment: 10 Vital Signs Height(in): 63 Pulse(bpm): 84 Weight(lbs): 150 Blood Pressure(mmHg): 147/95 Body Mass Index(BMI): 27 Temperature(F): 98.1 Respiratory  Rate(breaths/min): 17 Photos: [3:No Photos Left, Distal, Medial Lower Leg] [4:No Photos Left, Posterior Lower Leg] [[name]:[name] [name]] Wound Location: [3:Gradually Appeared] [4:Gradually Appeared] [[name]:[name]] Wounding Event: [3:Venous Leg Ulcer] [4:Venous Leg Ulcer] [[name]:[name]] Primary Etiology: [3:Sleep Apnea, Hypertension, Peripheral Sleep Apnea, Hypertension, Peripheral [name]] Comorbid History: [3:Venous Disease, Osteoarthritis, Neuropathy 04/09/2019] [4:Venous Disease, Osteoarthritis, Neuropathy 04/09/2019] [[name]:[name]] Date Acquired: [3:10] [4:10] [[name]:[name]] Weeks of Treatment: [3:Open] [4:Open] [[name]:[name]] Wound Status: [3:8.4x6.8x0.1] [4:1.9x2.2x0.1] [[name]:[name]] Measurements L x W x D (cm) [3:44.862] [4:3.283] [[name]:[name]] A (cm) : rea [3:4.486] [4:0.328] [[name]:[name]] Volume (cm) : [3:-533.30%] [4:-318.20%] [[name]:[name]] % Reduction in Area: [3:-216.60%] [4:-108.90%] [[name]:[name]] % Reduction in Volume: [3:Full Thickness Without Exposed] [4:Full Thickness Without Exposed] [[name]:[name]] Classification: [3:Support  Structures Large] [4:Support Structures Medium] [[name]:[name]] Exudate Amount: [3:Serosanguineous] [4:Serosanguineous] [[name]:[name]] Exudate Type: [3:red, brown] [4:red, brown] [[name]:[name]] Exudate Color: [3:Flat and Intact] [4:Distinct, outline attached] [[name]:[name]] Wound Margin: [3:Large (67-100%)] [4:Large (67-100%)] [[name]:[name]] Granulation Amount: [3:Red] [4:Red] [[name]:[name]] Granulation Quality: [3:Small (1-33%)] [4:Small (1-33%)] [[name]:[name]] Necrotic Amount: [3:Fat Layer (Subcutaneous Tissue): Yes Fat Layer (Subcutaneous Tissue): Yes [name]] Exposed Structures: [3:Fascia: No Tendon: No Muscle: No Joint: No Bone: No Small (1-33%)] [4:Fascia: No Tendon: No Muscle: No Joint: No Bone: No Small (1-33%)] [[name]:[name]] Epithelialization: [3:Compression Therapy] [4:Compression Therapy] [[name]:[name]] Treatment Notes Electronic Signature(s) Signed: 09/11/2020 4:50:54 PM By: [name] MD Signed: 09/13/2020 5:57:05 PM By: Zandra Abts RN, BSN Entered By: [name] on 09/11/2020 11:27:06 -------------------------------------------------------------------------------- Multi-Disciplinary Care Plan Details Patient Name: Date of Service: Jamie Brink, [name] 09/11/2020 11:00 A M Medical Record Number: [id number] Patient Account Number: [id number] Date of Birth/Sex: Treating RN: [birth year] (80 y.o. [name] Primary Care Jaiel Saraceno: [name] Other Clinician: Referring Avyana Puffenbarger: Treating Ronniesha Seibold/Extender: Vivia Budge in Treatment: 10 Active Inactive Venous Leg Ulcer Nursing Diagnoses: Actual venous Insuffiency (use after diagnosis is confirmed) Goals: Patient will maintain optimal edema control Date Initiated: 06/30/2020 Target Resolution Date: 09/22/2020 Goal Status: Active Interventions: Assess peripheral edema status every visit. Compression as ordered Notes: Wound/Skin Impairment Nursing Diagnoses: Impaired tissue  integrity Goals: Patient/caregiver will verbalize understanding of skin care regimen Date Initiated: 06/30/2020 Target Resolution Date: 09/22/2020 Goal Status: Active Ulcer/skin breakdown will have a volume reduction of 30% by week 4 Date Initiated: 06/30/2020 Date Inactivated: 08/07/2020 Target Resolution Date: 08/04/2020 Goal Status: Unmet Unmet Reason: non viable surface Interventions: Assess patient/caregiver ability to obtain necessary supplies Assess patient/caregiver ability to perform ulcer/skin care regimen upon admission and as needed Assess ulceration(s) every visit Provide education on ulcer and skin care Treatment Activities: Skin care regimen initiated : 06/30/2020 Topical wound management initiated : 06/30/2020 Notes: Electronic Signature(s) Signed: 09/11/2020 5:37:32 PM By: Fonnie Mu RN Entered By: Fonnie Mu on 09/11/2020 11:14:36 -------------------------------------------------------------------------------- Pain Assessment Details Patient Name: Date of Service: Jamie Mart W. 09/11/2020 11:00 A M Medical Record Number: [id number] Patient Account Number: [id number] Date of Birth/Sex: Treating RN: 05/28/1940 (80 y.o. [name], Lauren Primary Care Tranika Scholler: [name] Other Clinician: Referring Haley Fuerstenberg: Treating Ermie Glendenning/Extender: Vivia Budge in Treatment: 10 Active Problems Location of Pain Severity and Description of Pain Patient Has Paino Yes Site Locations Pain Location: Pain Location: Pain in Ulcers With Dressing Change: Yes Duration of the Pain. Constant / Intermittento Constant Rate the pain. Current Pain Level: 7 Worst Pain Level: 10 Least Pain Level: 0 Tolerable Pain Level: 7 Character of Pain Describe the Pain: Aching Pain Management and Medication Current Pain Management: Medication: Yes Cold Application: No Rest: Yes Massage: No Activity: No T.E.[name].S.: No Heat  Application: No Leg drop or elevation: No Is the Current Pain Management Adequate: Adequate How does your wound impact your activities of daily livingo Sleep: No Bathing: No Appetite: No Relationship With Others: No Bladder Continence: No Emotions: No Bowel Continence: No Work: No Toileting: No Drive: No Dressing: No Hobbies: No Electronic Signature(s) Signed: 09/11/2020 5:37:32 PM By: Fonnie MuBreedlove, Lauren RN Entered By: Fonnie MuBreedlove, Lauren on 09/11/2020 11:02:16 -------------------------------------------------------------------------------- Patient/Caregiver Education Details Patient Name: Date of Service: Jamie KrillWILSO [name], [name] 6/6/2022andnbsp11:00 A M Medical Record Number: [id number] Patient Account Number: [id number] Date of Birth/Gender: Treating RN: 12/16/1940 (80 y.o. Toniann FailF) Breedlove, Lauren Primary Care Physician: Delorise JacksonEggleston-Clark, [name] Other Clinician: Referring Physician: Treating Physician/Extender: Vivia Budgeobson, Michael Eggleston-Clark, [name] in Treatment: 10 Education Assessment Education Provided To: Patient Education Topics Provided Wound/Skin Impairment: Methods: Explain/Verbal Responses: State content correctly Nash-F[employer] CompanyElectronic Signature(s) Signed: 09/11/2020 5:37:32 PM By: Fonnie MuBreedlove, Lauren RN Entered By: Fonnie MuBreedlove, Lauren on 09/11/2020 11:14:57 -------------------------------------------------------------------------------- Wound Assessment Details Patient Name: Date of Service: Jamie MartWILSO [name], [name] 09/11/2020 11:00 A M Medical Record Number: [id number] Patient Account Number: [id number] Date of Birth/Sex: Treating RN: 12/28/1940 (80 y.o. Ardis RowanF) Breedlove, Lauren Primary Care Rosamae Rocque: Delorise JacksonEggleston-Clark, [name] Other Clinician: Referring Marenda Accardi: Treating Abelino Tippin/Extender: Conard Novakobson, Michael Eggleston-Clark, [name] in Treatment: 10 Wound Status Wound Number: 3 Primary Venous Leg Ulcer Etiology: Wound Location: Left, Distal, Medial Lower Leg Wound  Open Wounding Event: Gradually Appeared Status: Date Acquired: 04/09/2019 Comorbid Sleep Apnea, Hypertension, Peripheral Venous Disease, Weeks Of Treatment: 10 History: Osteoarthritis, Neuropathy Clustered Wound: No Photos Wound Measurements Length: (cm) 8.4 Width: (cm) 6.8 Depth: (cm) 0.1 Area: (cm) 44.862 Volume: (cm) 4.486 % Reduction in Area: -533.3% % Reduction in Volume: -216.6% Epithelialization: Small (1-33%) Tunneling: No Undermining: No Wound Description Classification: Full Thickness Without Exposed Support Structures Wound Margin: Flat and Intact Exudate Amount: Large Exudate Type: Serosanguineous Exudate Color: red, brown Foul Odor After Cleansing: No Slough/Fibrino Yes Wound Bed Granulation Amount: Large (67-100%) Exposed Structure Granulation Quality: Red Fascia Exposed: No Necrotic Amount: Small (1-33%) Fat Layer (Subcutaneous Tissue) Exposed: Yes Necrotic Quality: Adherent Slough Tendon Exposed: No Muscle Exposed: No Joint Exposed: No Bone Exposed: No Electronic Signature(s) Signed: 09/11/2020 5:37:32 PM By: Fonnie MuBreedlove, Lauren RN Signed: 09/12/2020 2:13:29 PM By: Karl Itoawkins, Destiny Entered By: Karl Itoawkins, Destiny on 09/11/2020 16:35:18 -------------------------------------------------------------------------------- Wound Assessment Details Patient Name: Date of Service: Jamie BrinkWILSO [name], Jamie GaveMA [name] 09/11/2020 11:00 A M Medical Record Number: [id number] Patient Account Number: [id number] Date of Birth/Sex: Treating RN: 03/25/1941 (80 y.o. Ardis RowanF) Breedlove, Lauren Primary Care Verner Mccrone: Delorise JacksonEggleston-Clark, [name] Other Clinician: Referring Parmvir Boomer: Treating Chavon Lucarelli/Extender: Conard Novakobson, Michael Eggleston-Clark, [name] in Treatment: 10 Wound Status Wound Number: 4 Primary Venous Leg Ulcer Etiology: Wound Location: Left, Posterior Lower Leg Wound Open Wounding Event: Gradually Appeared Status: Date Acquired: 04/09/2019 Comorbid Sleep Apnea, Hypertension,  Peripheral Venous Disease, Weeks Of Treatment: 10 History: Osteoarthritis, Neuropathy Clustered Wound: No Photos Wound Measurements Length: (cm) 1.9 Width: (cm) 2.2 Depth: (cm) 0.1 Area: (cm) 3.283 Volume: (cm) 0.328 % Reduction in Area: -318.2% % Reduction in Volume: -108.9% Epithelialization: Small (1-33%) Tunneling: No Undermining: No Wound Description Classification: Full Thickness Without Exposed Support Structures Wound Margin: Distinct, outline attached Exudate Amount: Medium Exudate Type: Serosanguineous Exudate Color: red, brown Foul Odor After Cleansing: No Slough/Fibrino Yes Wound Bed Granulation Amount: Large (67-100%) Exposed Structure Granulation Quality: Red Fascia Exposed: No Necrotic Amount: Small (1-33%) Fat Layer (Subcutaneous Tissue) Exposed: Yes Necrotic Quality: Adherent Slough Tendon Exposed: No Muscle Exposed: No Joint Exposed: No Bone Exposed: No Electronic Signature(s) Signed: 09/11/2020 5:37:32  PM By: Fonnie Mu RN Signed: 09/12/2020 2:13:29 PM By: Karl Ito Entered By: Karl Ito on 09/11/2020 16:35:54 -------------------------------------------------------------------------------- Vitals Details Patient Name: Date of Service: Jamie Brink, [name] 09/11/2020 11:00 A M Medical Record Number: [id number] Patient Account Number: [id number] Date of Birth/Sex: Treating RN: Oct 31, 1940 (80 y.o. [name], Lauren Primary Care Josecarlos Harriott: [name] Other Clinician: Referring Jon Lall: Treating Haleigh Desmith/Extender: Vivia Budge in Treatment: 10 Vital Signs Time Taken: 11:01 Temperature (F): 98.1 Height (in): 63 Pulse (bpm): 84 Weight (lbs): 150 Respiratory Rate (breaths/min): 17 Body Mass Index (BMI): 26.6 Blood Pressure (mmHg): 147/95 Reference Range: 80 - 120 mg / dl Electronic Signature(s) Signed: 09/11/2020 5:37:32 PM By: Fonnie Mu RN Entered By: Fonnie Mu on 09/11/2020 11:01:40

## 2020-09-13 NOTE — Progress Notes (Addendum)
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 She has CEAP C6 venous disease. In addition, she has evidence of lymphedema secondary to chronic venous insufficiency. She has been elevating her legs but I have instructed her on the correct positioning for this which I think will be helpful.  She has been doing compression dressings as described below and is making progress.  We have also discussed the importance of exercise and she tries to exercise as best she can but is limited somewhat by the dressing on her left leg.  I reassured her that she has excellent arterial flow.  Fortunately she is not diabetic and is not a smoker.  I think she would benefit from the BioTAB pneumatic compression device given that she has had this ulcer for so long.  Based on her venous reflux test she was not a candidate for laser ablation and so conservative measures currently are the only options to help this wound heal  REASON FOR CONSULT:    Nonhealing wound of the left leg.  The consult is requested by Geralyn Corwin, DO  HPI:   Jamie [name] is a 80 y.o. female who was referred with a wound on her left medial lower leg and ankle.  I have reviewed the records from the referring office.  The patient was seen on 08/28/2020 at [facility].  She has a wound on her left medial lower leg and ankle.  These have been present for over 2 years.  This patient has had this chronic venous ulcer for over 2 years.  It started as a small wound adjacent to her medial malleolus but expanded significantly.  Her family member showed me a picture from a month ago when it was quite large.  She has made some progress as documented in the photograph below.  She has been getting a collagen  dressing change done 3 times a week with a 3 layer compression dressing.  This is done by the home health nurse and then she seen at the wound care  center every 2 weeks.  She is not diabetic.  She is not a smoker.  Prior to developing this wound she denies any history of claudication, rest pain, or nonhealing ulcers.  Past Medical History:  Diagnosis Date   Hypertension     History reviewed. No pertinent family history.  SOCIAL HISTORY: Social History   Tobacco Use   Smoking status: Former Smoker   Smokeless tobacco: Never Used  Substance Use Topics   Alcohol use: Not on file    Allergies  Allergen Reactions   Amlodipine Swelling    Swelling in legs Swelling in legs    Bee Venom Anaphylaxis   Niacin Rash   Aspirin     Burning sensation in GI tract    Nicotine     Unknown    Tizanidine Hcl     DIZZY DIZZY    Gabapentin Itching and Rash   Lisinopril Itching    Caused lower extremity ulcer Caused lower extremity ulcer    Valsartan Itching    Current Outpatient Medications  Medication Sig Dispense Refill   oxyCODONE-acetaminophen (PERCOCET) 10-325 MG tablet Take 1 tablet by mouth 4 (four) times daily as needed.     albuterol (VENTOLIN HFA) 108 (90 Base) MCG/ACT inhaler INHALE 2 PUFFS BY MOUTH EVERY 6 HOURS AS NEEDED FOR WHEEZING AND FOR SHORTNESS OF BREATH  ascorbic acid (VITAMIN C) 1000 MG tablet Take by mouth.     aspirin (ASPIRIN 81) 81 MG EC tablet Take by mouth.     chlorhexidine (PERIDEX) 0.12 % solution Take by mouth.     Cholecalciferol 25 MCG (1000 UT) tablet Take by mouth.     clonazePAM (KLONOPIN) 0.5 MG tablet Take by mouth.     collagenase (SANTYL) ointment Apply topically.     fluticasone (FLONASE) 50 MCG/ACT nasal spray 2 sprays into each nostril daily as needed for allergies.     GARLIC OIL PO Take by mouth.     hydrALAZINE (APRESOLINE) 100 MG tablet Take by mouth. (Patient not taking: Reported on 09/13/2020)     hydrALAZINE (APRESOLINE) 25 MG tablet Take 25 mg by mouth 2 (two) times daily.     hydrochlorothiazide (HYDRODIURIL) 25 MG tablet Take by mouth.     ketotifen (ZADITOR) 0.025 %  ophthalmic solution Apply to eye.     meloxicam (MOBIC) 15 MG tablet Take by mouth.     metoprolol tartrate (LOPRESSOR) 25 MG tablet Take 1 tablet by mouth 2 (two) times daily.     Multiple Vitamin (MULTIVITAMINS PO) Take 1 tablet by mouth daily.     Multiple Vitamins-Minerals (CENTRUM SILVER ULTRA WOMENS PO) Take by mouth.     naloxone (NARCAN) nasal spray 4 mg/0.1 mL Administer a single spray intranasally into one nostril.  Call 911.  May repeat x1 in 2 to 3 minutes using a new nasal spray. PRESCRIPTION MUST BE UP TO DATE.     nortriptyline (PAMELOR) 10 MG capsule Take by mouth. (Patient not taking: Reported on 09/13/2020)     omeprazole (PRILOSEC) 20 MG capsule Take by mouth.     oxyCODONE-acetaminophen (PERCOCET) 7.5-325 MG tablet Take 1 tablet by mouth 4 (four) times daily. (Patient not taking: Reported on 09/13/2020)     PROPYLENE GLYCOL OP Apply to eye.     Pyridoxine HCl 200 MG TBCR Take by mouth.     sodium chloride irrigation 0.9 % irrigation SMARTSIG:Topical     Sodium Citrate-Gentamicin Sulf 4-320 %-MCG/ML SOLN Mix 1 scoop (500mg ) of compounded powder and 77mL of saline; spray onto affected area(s) once daily or with dressing changes. Discard after 24 hours.     traMADol (ULTRAM) 50 MG tablet Take by mouth. (Patient not taking: Reported on 09/13/2020)     triamcinolone cream (KENALOG) 0.5 % Apply topically.     zinc gluconate 50 MG tablet Take by mouth.     No current facility-administered medications for this visit.    REVIEW OF SYSTEMS:  [X]  denotes positive finding, [ ]  denotes negative finding Cardiac  Comments:  Chest pain or chest pressure:    Shortness of breath upon exertion:    Short of breath when lying flat:    Irregular heart rhythm:        Vascular    Pain in calf, thigh, or hip brought on by ambulation:    Pain in feet at night that wakes you up from your sleep:     Blood clot in your veins:    Leg swelling:  x       Pulmonary    Oxygen at home:    Productive  cough:     Wheezing:         Neurologic    Sudden weakness in arms or legs:     Sudden numbness in arms or legs:     Sudden onset of difficulty speaking or slurred  speech:    Temporary loss of vision in one eye:     Problems with dizziness:         Gastrointestinal    Blood in stool:     Vomited blood:         Genitourinary    Burning when urinating:     Blood in urine:        Psychiatric    Major depression:         Hematologic    Bleeding problems:    Problems with blood clotting too easily:        Skin    Rashes or ulcers:        Constitutional    Fever or chills:    -  PHYSICAL EXAM:   Vitals:   09/13/20 1331  BP: 115/74  Pulse: 81  Resp: 20  SpO2: 96%  Weight: 150 lb (68 kg)  Height: 5\' 3"  (1.6 m)   Body mass index is 26.57 kg/m.   GENERAL: The patient is a well-nourished female, in no acute distress. The vital signs are documented above. CARDIAC: There is a regular rate and rhythm.  She has a systolic ejection murmur. VASCULAR: I do not detect carotid bruits. I am unable to assess her femoral pulses as she is in the wheelchair.  She does have a palpable right dorsalis pedis pulse. She has a 3 layer compression dressing on the left leg and asked that we not remove this as this is going to be changed by the home health nurse.  I do have a picture that was taken 2 days ago by the daughter noted below.  She has an open ulcer as noted above with significant hyperpigmentation and hyperkeratosis. PULMONARY: There is good air exchange bilaterally without wheezing or rales. ABDOMEN: Soft and non-tender with normal pitched bowel sounds.  MUSCULOSKELETAL: There are no major deformities. NEUROLOGIC: No focal weakness or paresthesias are detected. SKIN: See the photograph above. PSYCHIATRIC: The patient has a normal affect.  DATA:    ARTERIAL DOPPLER STUDY: I have reviewed her arterial Doppler study that was done on 08/21/2020.08/23/2020  On the right side there is a  triphasic dorsalis pedis and posterior tibial signal.  ABIs 100%.  Toe pressures 112 mmHg.  On the left side there is a triphasic dorsalis pedis and posterior tibial signal.  ABIs 100%.  Toe pressures 122 mmHg.  VENOUS DUPLEX: I have reviewed her venous duplex scan that was done on 08/21/2020.  On the right side there was no evidence of DVT or superficial venous thrombosis.  There was deep venous reflux involving the common femoral vein.  There was no significant superficial venous reflux.  On the left side there was no evidence of DVT or superficial venous thrombosis.  There was no deep venous reflux.  There was some reflux in the great saphenous vein from the mid thigh to the knee but the vein here was not dilated.  Maximum diameter was about 3 mm.  08/23/2020 [facility]

## 2020-09-25 ENCOUNTER — Other Ambulatory Visit: Payer: Self-pay

## 2020-09-25 ENCOUNTER — Encounter (HOSPITAL_BASED_OUTPATIENT_CLINIC_OR_DEPARTMENT_OTHER): Payer: Medicare Other | Admitting: Internal Medicine

## 2020-09-25 DIAGNOSIS — L97922 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: Secondary | ICD-10-CM | POA: Diagnosis not present

## 2020-09-25 NOTE — Progress Notes (Signed)
Jamie [name], Jamie [name] ([id number]) Visit Report for 09/25/2020 HPI Details [name] Name: Date of Service: Jamie [name], [name]. 09/25/2020 11:00 A M Jamie Record Number: [id number] [name] Account Number: [id number] Date of Birth/Sex: Treating RN: [birth year] (80 y.o. Jamie [name] Primary Care Provider: Delorise [name] Other Clinician: Referring Provider: Treating Provider/Extender: Jamie [name] in Treatment: 12 History of Present Illness HPI Description: ADMISSION 06/30/2020 Jamie [name] is an 80 year old woman who lives in [place]. Jamie [name] is here with her niece for review of wounds on Jamie left medial lower leg and ankle. These have apparently been present for over a year and Jamie [name] followed with Dr. Olegario [name] at [name] in [name] for quite a period of time although it looks as though there was a initial consult wound from Dr. Marcha [name] on February 18 presumably there was therefore hiatus. At that point Jamie wounds were described as being there for 3 months. Jamie [name] also tells me Jamie [name] was at [name] in [name] for a period of time with Jamie. 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 Jamie [name] had venous studies that were negative for DVT ABIs on Jamie right were 1.01 on Jamie left 1.06. Jamie [name] has . had previous applications of puraply, compression which Jamie [name] does not tolerate very well. Jamie [name] has had several rounds of oral antibiotic therapy. Jamie [name] complains of unrelenting pain and Jamie [name] is seeing Dr. Reece Agar [name] of pain management apparently was on oxycodone but that did not help. Jamie [name] also had a skin biopsy done by Dr. Olegario [name] although we do not have that result. Jamie [name] does not appear to have an arterial issue. I am not completely clear what Jamie [name] has been putting on Jamie wounds lately. 3/31; Jamie [name] has a particularly nasty set of wounds on Jamie left medial ankle in Jamie middle of what looks to be  hemosiderin deposition secondary to chronic venous insufficiency. Jamie [name] has a lot of pain followed by pain management. Dr. Marcha [name] apparently did a biopsy of something on Jamie left leg last fall what I would like to get Jamie result. 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 I do not believe Jamie [name] had reflux studies but Jamie [name] did have DVT rule out studies. Previous ABIs have not suggested arterial insufficiency 4/7; difficult wounds on Jamie left medial ankle probably chronic venous insufficiency. With considerable effort on behalf of our case manager we were able to finally to speak to somebody at [name] in [name] who indicated that no biopsy of Jamie area have been done even though Jamie [name] describes Jamie in some detail and is even able to point out where Jamie [name] thinks Jamie biopsy was done. We have been using Sorbact. Jamie PCR culture I did showed polymicrobial identification with Pseudomonas, staph aureus, Peptostreptococcus. All of Jamie and low titers. Resistance genes identified were MRSA, staph virulence gene and tetracycline. We are going to send Jamie to [name] for a topical antibiotic which is something that we have had good success with recently in large venous ulcers with a lot of purulent drainage. There would not be an easy oral alternative here possibly line escalated and ciprofloxacin if we need to use systemic antibiotic 4/15; difficult area on Jamie left medial ankle. Most likely chronic venous insufficiency. I think Jamie [name] will probably need venous reflux study I think Jamie [name] had DVT rule outs but not venous reflux studies. We have not yet obtained Jamie topical antibiotics. Jamie [name] has home health changing  Jamie dressing we have been using Sorbact for adherent fibrinous debris on Jamie surface. Very difficult to remove 4/22; [name] presents for 1 week follow-up. Jamie [name] has been using sore back under compression wraps and these are changed 3 times a week with home health. Jamie [name] also had Keystone antibiotics sent to her  house and brought them in today. Jamie [name] has no complaints or issues today. 5/2; [name] is here for follow-up. Jamie [name] has been using Sorbact under compression. Very painful wound. Jamie [name] has been using Keystone antibiotics. Not much improvement although Jamie more medial part of Jamie wound has cleaned up nicely and Jamie larger part of Jamie wound about 50% slough covered. Part of Jamie issue here is that Jamie [name], [name] and now Jamie [name]. Not sure if Jamie [name] has had venous reflux studies. As far as we are able to tell Jamie [name] did not have a biopsy. My notes state that Jamie [name] did not have venous reflux studies just DVT rule outs. 5/16; [name] goes for venous reflux studies Jamie afternoon. Jamie [name] says that wound was biopsied which sounds like punch biopsies by Jamie [name] we do not have these results. We are using Sorbact. Very difficult wound to debride 5/23; [name] presents for 1 week follow-up. Jamie [name] reports tolerating Jamie wraps well with sorbact underneath. Jamie [name] had ABIs and venous reflux studies done. Jamie [name] has no issues or complaints today. Jamie [name] denies acute signs of infection. 6/6; I have reviewed Jamie [name]'s vascular studies. Wounds are on Jamie left medial and posterior calf. Jamie [name] had significant reflux in Jamie greater saphenous vein in Jamie in Jamie mid thigh, distal thigh knee and Jamie small saphenous vein in Jamie popliteal fossa. Jamie vein diameters do not look too impressive though. Jamie [name] is going to see Jamie vascular surgeon on Wednesday. Jamie [name] also had venous reflux in Jamie right common femoral vein. Jamie [name] did not have any evidence of a DVT or SVT I am wondering whether there is an ablation procedure that would benefit her in Jamie greater saphenous vein on Jamie left. . Jamie [name] tells Jamie [name] that home health put Jamie dressing on too tight and Jamie [name] took off 1 layer. Jamie swelling in her left leg is a little worse as a result of Jamie. Not much change in Jamie wound measurements so Jamie surface of Jamie wound  looks better Her arterial studies showed an ABI on Jamie right of 1.14 with a triphasic waveform and a great toe pressure of 0.89. On Jamie left her ABIs were noncompressible at 1.34 but with triphasic waveforms and a TBI of 0.97. Her greater toe pressure was 122. There was no evidence of significant bilateral arterial disease 6/20 [name] went to see Dr. Durwin Noraixon. He did not think Jamie [name] had significant arterial disease. In terms of her venous duplex on Jamie right side there was no evidence of a DVT or SVT there was deep venous reflux involving Jamie common femoral vein no superficial vein reflux on Jamie left side there was no DVT or SVT there was no deep vein graft reflux there was some reflux in Jamie greater saphenous vein from Jamie mid thigh to Jamie knee but Jamie vein here was not dilated. He thought these were venous wounds he prescribed a compression pump but I am not sure who we ordered it from Jamie [name] has been approved for Apligraf. Still using silver collagen Jamie week Electronic Signature(s) Signed: 09/25/2020 5:10:27 PM By: Baltazar Najjarobson, Ceonna Frazzini MD Signed: 09/25/2020 5:10:27 PM By:  [name] MD Entered By: [name] on 09/25/2020 11:53:39 -------------------------------------------------------------------------------- Physical Exam Details [name] Name: Date of Service: Jamie [name], [name]. 09/25/2020 11:00 A M Jamie Record Number: [id number] [name] Account Number: [id number] Date of Birth/Sex: Treating RN: 18-Dec-1940 (80 y.o. Jamie [name] Primary Care Provider: Delorise [name] Other Clinician: Referring Provider: Treating Provider/Extender: [name] Weeks in Treatment: 12 Constitutional Sitting or standing Blood Pressure is within target range for [name].. Pulse regular and within target range for [name].Marland Kitchen Respirations regular, non-labored and within target range.. Temperature is normal and within Jamie target range for Jamie [name].Marland Kitchen  Appears in no distress. Notes Wound exam; left anterior lower leg and Jamie medial part of Jamie left calf. Both of these wounds are about Jamie same size. Jamie surface does look better no mechanical debridement. Jamie [name] has stasis dermatitis especially in Jamie medial lower leg Electronic Signature(s) Signed: 09/25/2020 5:10:27 PM By: [name] MD Entered By: [name] on 09/25/2020 11:54:42 -------------------------------------------------------------------------------- Physician Orders Details [name] Name: Date of Service: Jamie [name], [name] 09/25/2020 11:00 A M Jamie Record Number: [id number] [name] Account Number: [id number] Date of Birth/Sex: Treating RN: 07/17/1940 (80 y.o. 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 [place], Wed, or Thurs with Dr. Leanord Hawking Return A Cellular or Tissue Based Products Cellular or Tissue Based Product Type: - Run IVR for Apligraft Bathing/ Shower/ Hygiene May shower with protection but do not get wound dressing(s) wet. Edema Control - Lymphedema / SCD / Other Elevate legs to Jamie level of Jamie heart or above for 30 minutes daily and/or when sitting, a frequency of: - throughout Jamie day 3-4 times a day. Avoid standing for long periods of time. Exercise regularly Additional Orders / Instructions Follow Nutritious Diet Home Health No change in wound care orders Jamie week; continue Home Health for wound care. May utilize formulary equivalent dressing for wound treatment orders unless  otherwise specified. Other Home Health Orders/Instructions: - [facility] fax #[phone] Wound Treatment Wound #3 - Lower Leg Wound Laterality: Left, Medial, Distal Cleanser: Soap and Water (Home Health) 2 x Per Week/30 Days Discharge Instructions: May shower and wash wound with dial antibacterial soap and water prior to dressing change. Cleanser: Wound Cleanser (Home Health) 2 x Per Week/30 Days Discharge Instructions: Cleanse Jamie wound with wound cleanser prior to applying a clean dressing using gauze sponges, not tissue or cotton balls. Peri-Wound Care: Triamcinolone 15 (g) 2 x Per Week/30 Days Discharge Instructions: In clinic only. Apply to red, irritated periwound Peri-Wound Care: Sween Lotion (Moisturizing lotion) (Home Health) 2 x Per Week/30 Days Discharge Instructions: apply at home. Apply moisturizing lotion as directed Topical: Keystone Antibiotic Compound (Home Health) 2 x Per Week/30 Days Discharge Instructions: Apply directly to wound bed under Sorbact Prim Dressing: Promogran Prisma Matrix, 4.34 (sq in) (silver collagen) (Home Health) 2 x Per Week/30 Days ary Discharge Instructions: Moisten collagen with saline or hydrogel Secondary Dressing: Woven Gauze Sponge, Non-Sterile 4x4 in (Home Health) 2 x Per Week/30 Days Discharge Instructions: Apply over primary dressing as directed. Secondary Dressing: ABD Pad, 8x10 (Home Health) 2 x Per Week/30 Days Discharge Instructions: Apply over  primary dressing as directed. Secondary Dressing: CarboFLEX Odor Control Dressing, 4x4 in (Home Health) 2 x Per Week/30 Days Discharge Instructions: Apply over primary dressing in office only Compression Wrap: ThreePress (3 layer compression wrap) 2 x Per Week/30 Days Discharge Instructions: Apply three layer compression as directed. Wound #4 - Lower Leg Wound Laterality: Left, Posterior Cleanser: Soap and Water (Home Health) 2 x Per Week/30 Days Discharge Instructions: May  shower and wash wound with dial antibacterial soap and water prior to dressing change. Cleanser: Wound Cleanser (Home Health) 2 x Per Week/30 Days Discharge Instructions: Cleanse Jamie wound with wound cleanser prior to applying a clean dressing using gauze sponges, not tissue or cotton balls. Peri-Wound Care: Triamcinolone 15 (g) 2 x Per Week/30 Days Discharge Instructions: In clinic only. Apply to red, irritated periwound Peri-Wound Care: Sween Lotion (Moisturizing lotion) (Home Health) 2 x Per Week/30 Days Discharge Instructions: apply at home. Apply moisturizing lotion as directed Topical: Keystone Antibiotic Compound (Home Health) 2 x Per Week/30 Days Discharge Instructions: Apply directly to wound bed under Sorbact Prim Dressing: Promogran Prisma Matrix, 4.34 (sq in) (silver collagen) (Home Health) 2 x Per Week/30 Days ary Discharge Instructions: Moisten collagen with saline or hydrogel Secondary Dressing: Woven Gauze Sponge, Non-Sterile 4x4 in (Home Health) 2 x Per Week/30 Days Discharge Instructions: Apply over primary dressing as directed. Secondary Dressing: ABD Pad, 8x10 (Home Health) 2 x Per Week/30 Days Discharge Instructions: Apply over primary dressing as directed. Secondary Dressing: CarboFLEX Odor Control Dressing, 4x4 in (Home Health) 2 x Per Week/30 Days Discharge Instructions: Apply over primary dressing in office only Compression Wrap: ThreePress (3 layer compression wrap) 2 x Per Week/30 Days Discharge Instructions: Apply three layer compression as directed. Electronic Signature(s) Signed: 09/25/2020 5:10:27 PM By: [name] MD Signed: 09/25/2020 5:37:12 PM By: Zandra Abts RN, BSN Entered By: Zandra Abts on 09/25/2020 11:43:38 -------------------------------------------------------------------------------- Problem List Details [name] Name: Date of Service: Jamie [name], [name] 09/25/2020 11:00 A M Jamie Record Number: [id number] [name] Account Number:  [id number] Date of Birth/Sex: Treating RN: 1941/01/05 (80 y.o. 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 09/25/2020 11:00 A M Jamie Record Number: [id number] [name] Account Number: [id number] Date of Birth/Sex: Treating RN: [birth year] (80 y.o. Jamie [name] Primary Care Provider: Delorise [name] Other Clinician: Referring Provider: Treating Provider/Extender: [name] Weeks in Treatment: 12 Subjective History of Present Illness (HPI) ADMISSION 06/30/2020 Mrs. Pieratt is an 80 year old woman who lives in [place]. Jamie [name] is here with her niece for review of wounds on Jamie left medial lower leg and ankle. These have apparently been present for over a year and Jamie [name] followed with Dr. Olegario [name] at [name] in [place] for quite a period of time although it looks as though there was a initial consult wound from Dr. Marcha [name] on February 18 presumably there was therefore hiatus. At that point Jamie wounds were described as being there for 3 months. Jamie [name] also tells  me Jamie [name] was at [name] in [place] for a period of time with Jamie. 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 Jamie [name] had venous studies that were negative for DVT ABIs on Jamie right were 1.01 on Jamie left 1.06. Jamie [name] has . had previous applications of puraply, compression which Jamie [name] does not tolerate very well. Jamie [name] has had several rounds of oral antibiotic therapy. Jamie [name] complains of unrelenting pain and Jamie [name] is seeing Dr. Reece Agar [name] of pain management apparently was on oxycodone but that did not help. Jamie [name] also had a skin biopsy done by Dr. Olegario [name] although we do not have that result. Jamie [name] does not appear to have an arterial issue. I am not completely clear what Jamie [name] has been putting on Jamie wounds lately. 3/31; Jamie [name] has a particularly nasty set of wounds on Jamie left medial ankle in Jamie middle of what looks to be hemosiderin deposition secondary to chronic venous insufficiency. Jamie [name] has a lot of pain followed by pain management. Dr. Marcha [name] apparently did a biopsy of something on Jamie left leg last fall what I would like to get Jamie result. 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 I do not believe Jamie [name] had reflux studies but Jamie [name] did have DVT rule out studies. Previous ABIs have not suggested arterial insufficiency 4/7; difficult wounds on Jamie left medial ankle probably chronic venous insufficiency. With considerable effort on behalf of our case manager we were able to finally to speak to somebody at Jamie [name] in [employer] who indicated that no biopsy of Jamie area have been done even though Jamie [name] describes Jamie in some detail and is even able to point out where Jamie [name] thinks Jamie biopsy was done. We have been using Sorbact. Jamie PCR culture I did showed polymicrobial identification with Pseudomonas, staph aureus, Peptostreptococcus. All of Jamie and low titers. Resistance genes identified were MRSA, staph virulence gene and tetracycline. We are going to send Jamie to [facility] for a topical antibiotic which is something that we have had good success with recently in  large venous ulcers with a lot of purulent drainage. There would not be an easy oral alternative here possibly line escalated and ciprofloxacin if we need to use systemic antibiotic 4/15; difficult area on Jamie left medial ankle. Most likely chronic venous insufficiency. I think Jamie [name] will probably need venous reflux study I think Jamie [name] had DVT rule outs but not venous reflux studies. We have not yet obtained Jamie topical antibiotics. Jamie [name] has home health changing Jamie dressing we have been using Sorbact for adherent fibrinous debris on Jamie surface. Very difficult to remove 4/22; [name] presents for 1 week follow-up. Jamie [name] has been using sore back under compression wraps and these are changed 3 times a week with home health. Jamie [name] also had Keystone antibiotics sent to her house and brought them in today. Jamie [name] has no complaints or issues today. 5/2; [name] is here for follow-up. Jamie [name] has been using Sorbact under compression. Very painful wound. Jamie [name] has been using Keystone antibiotics. Not much improvement although Jamie more medial part of Jamie wound has cleaned up nicely and Jamie larger part of Jamie wound about 50% slough covered. Part of Jamie issue here is that Jamie [name] had stays at both [name], [name] and now [place]. Not sure if Jamie [name] has had venous reflux studies. As far as we are able to tell Jamie [name] did not have a biopsy. My notes state  that Jamie [name] did not have venous reflux studies just DVT rule outs. 5/16; [name] goes for venous reflux studies Jamie afternoon. Jamie [name] says that wound was biopsied which sounds like punch biopsies by Dr. Lynden Ang we do not have these results. We are using Sorbact. Very difficult wound to debride 5/23; [name] presents for 1 week follow-up. Jamie [name] reports tolerating Jamie wraps well with sorbact underneath. Jamie [name] had ABIs and venous reflux studies done. Jamie [name] has no issues or complaints today. Jamie [name] denies acute signs of infection. 6/6; I have reviewed Jamie [name]'s vascular  studies. Wounds are on Jamie left medial and posterior calf. Jamie [name] had significant reflux in Jamie greater saphenous vein in Jamie in Jamie mid thigh, distal thigh knee and Jamie small saphenous vein in Jamie popliteal fossa. Jamie vein diameters do not look too impressive though. Jamie [name] is going to see Jamie vascular surgeon on Wednesday. Jamie [name] also had venous reflux in Jamie right common femoral vein. Jamie [name] did not have any evidence of a DVT or SVT I am wondering whether there is an ablation procedure that would benefit her in Jamie greater saphenous vein on Jamie left. . Jamie [name] tells [place] that home health put Jamie dressing on too tight and Jamie [name] took off 1 layer. Jamie swelling in her left leg is a little worse as a result of Jamie. Not much change in Jamie wound measurements so Jamie surface of Jamie wound looks better Her arterial studies showed an ABI on Jamie right of 1.14 with a triphasic waveform and a great toe pressure of 0.89. On Jamie left her ABIs were noncompressible at 1.34 but with triphasic waveforms and a TBI of 0.97. Her greater toe pressure was 122. There was no evidence of significant bilateral arterial disease 6/20 [name] went to see Dr. Durwin Nora. He did not think Jamie [name] had significant arterial disease. In terms of her venous duplex on Jamie right side there was no evidence of a DVT or SVT there was deep venous reflux involving Jamie common femoral vein no superficial vein reflux on Jamie left side there was no DVT or SVT there was no deep vein graft reflux there was some reflux in Jamie greater saphenous vein from Jamie mid thigh to Jamie knee but Jamie vein here was not dilated. He thought these were venous wounds he prescribed a compression pump but I am not sure who we ordered it from Jamie [name] has been approved for Apligraf. Still using silver collagen Jamie week Objective Constitutional Sitting or standing Blood Pressure is within target range for [name].. Pulse regular and within target range for [name].Marland Kitchen Respirations regular,  non-labored and within target range.. Temperature is normal and within Jamie target range for Jamie [name].Marland Kitchen Appears in no distress. Vitals Time Taken: 11:28 AM, Height: 63 in, Weight: 150 lbs, BMI: 26.6, Temperature: 98.7 F, Pulse: 74 bpm, Respiratory Rate: 16 breaths/min, Blood Pressure: 115/71 mmHg. General Notes: Wound exam; left anterior lower leg and Jamie medial part of Jamie left calf. Both of these wounds are about Jamie same size. Jamie surface does look better no mechanical debridement. Jamie [name] has stasis dermatitis especially in Jamie medial lower leg Integumentary (Hair, Skin) Wound #3 status is Open. Original cause of wound was Gradually Appeared. Jamie date acquired was: 04/09/2019. Jamie wound has been in treatment 12 weeks. Jamie wound is located on Jamie Left,Distal,Medial Lower Leg. Jamie wound measures 9cm length x 6.4cm width x 0.1cm depth; 45.239cm^2 area and 4.524cm^3 volume. There is Fat Layer (Subcutaneous Tissue) exposed. There is no tunneling or  undermining noted. There is a large amount of serosanguineous drainage noted. Jamie wound margin is flat and intact. There is large (67-100%) red granulation within Jamie wound bed. There is a small (1-33%) amount of necrotic tissue within Jamie wound bed including Adherent Slough. Wound #4 status is Open. Original cause of wound was Gradually Appeared. Jamie date acquired was: 04/09/2019. Jamie wound has been in treatment 12 weeks. Jamie wound is located on Jamie Left,Posterior Lower Leg. Jamie wound measures 1.4cm length x 1cm width x 0.1cm depth; 1.1cm^2 area and 0.11cm^3 volume. There is Fat Layer (Subcutaneous Tissue) exposed. There is no tunneling or undermining noted. There is a medium amount of serosanguineous drainage noted. Jamie wound margin is distinct with Jamie outline attached to Jamie wound base. There is large (67-100%) red granulation within Jamie wound bed. There is a small (1-33%) amount of necrotic tissue within Jamie wound bed including Adherent  Slough. 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 There was a Three Layer Compression Therapy Procedure by Zandra Abts, RN. 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 There was a Three Layer Compression Therapy Procedure by Zandra Abts, RN. Post procedure Diagnosis Wound #4: Same as Pre-Procedure Plan Follow-up Appointments: Return Appointment in 1 week. [place], Wed, or Thurs with Dr. Leanord Hawking Cellular or Tissue Based Products: Cellular or Tissue Based Product Type: - Run IVR for Apligraft Bathing/ Shower/ Hygiene: May shower with protection but do not get wound dressing(s) wet. Edema Control - Lymphedema / SCD / Other: Elevate legs to Jamie level of Jamie heart or above for 30 minutes daily and/or when sitting, a frequency of: - throughout Jamie day 3-4 times a day. Avoid standing for long periods of time. Exercise regularly Additional Orders / Instructions: Follow Nutritious Diet Home Health: No change in wound care orders Jamie week; continue Home Health for wound care. May utilize formulary equivalent dressing for wound treatment orders unless otherwise specified. Other Home Health Orders/Instructions: - [facility]Jamie fax #[phone] WOUND #3: - Lower Leg Wound Laterality: Left, Medial, Distal Cleanser: Soap and Water (Home Health) 2 x Per Week/30 Days Discharge Instructions: May shower and wash wound with dial antibacterial soap and water prior to dressing change. Cleanser: Wound Cleanser (Home Health) 2 x Per Week/30 Days Discharge Instructions:  Cleanse Jamie wound with wound cleanser prior to applying a clean dressing using gauze sponges, not tissue or cotton balls. Peri-Wound Care: Triamcinolone 15 (g) 2 x Per Week/30 Days Discharge Instructions: In clinic only. Apply to red, irritated periwound Peri-Wound Care: Sween Lotion (Moisturizing lotion) (Home Health) 2 x Per Week/30 Days Discharge Instructions: apply at home. Apply moisturizing lotion as directed Topical: Keystone Antibiotic Compound (Home Health) 2 x Per Week/30 Days Discharge Instructions: Apply directly to wound bed under Sorbact Prim Dressing: Promogran Prisma Matrix, 4.34 (sq in) (silver collagen) (Home Health) 2 x Per Week/30 Days ary Discharge Instructions: Moisten collagen with saline or hydrogel Secondary Dressing: Woven Gauze Sponge, Non-Sterile 4x4 in (Home Health) 2 x Per Week/30 Days Discharge Instructions: Apply over primary dressing as directed. Secondary Dressing: ABD Pad, 8x10 (Home Health) 2 x Per Week/30 Days Discharge Instructions: Apply  over primary dressing as directed. Secondary Dressing: CarboFLEX Odor Control Dressing, 4x4 in (Home Health) 2 x Per Week/30 Days Discharge Instructions: Apply over primary dressing in office only Com pression Wrap: ThreePress (3 layer compression wrap) 2 x Per Week/30 Days Discharge Instructions: Apply three layer compression as directed. WOUND #4: - Lower Leg Wound Laterality: Left, Posterior Cleanser: Soap and Water (Home Health) 2 x Per Week/30 Days Discharge Instructions: May shower and wash wound with dial antibacterial soap and water prior to dressing change. Cleanser: Wound Cleanser (Home Health) 2 x Per Week/30 Days Discharge Instructions: Cleanse Jamie wound with wound cleanser prior to applying a clean dressing using gauze sponges, not tissue or cotton balls. Peri-Wound Care: Triamcinolone 15 (g) 2 x Per Week/30 Days Discharge Instructions: In clinic only. Apply to red, irritated periwound Peri-Wound Care:  Sween Lotion (Moisturizing lotion) (Home Health) 2 x Per Week/30 Days Discharge Instructions: apply at home. Apply moisturizing lotion as directed Topical: Keystone Antibiotic Compound (Home Health) 2 x Per Week/30 Days Discharge Instructions: Apply directly to wound bed under Sorbact Prim Dressing: Promogran Prisma Matrix, 4.34 (sq in) (silver collagen) (Home Health) 2 x Per Week/30 Days ary Discharge Instructions: Moisten collagen with saline or hydrogel Secondary Dressing: Woven Gauze Sponge, Non-Sterile 4x4 in (Home Health) 2 x Per Week/30 Days Discharge Instructions: Apply over primary dressing as directed. Secondary Dressing: ABD Pad, 8x10 (Home Health) 2 x Per Week/30 Days Discharge Instructions: Apply over primary dressing as directed. Secondary Dressing: CarboFLEX Odor Control Dressing, 4x4 in (Home Health) 2 x Per Week/30 Days Discharge Instructions: Apply over primary dressing in office only Com pression Wrap: ThreePress (3 layer compression wrap) 2 x Per Week/30 Days Discharge Instructions: Apply three layer compression as directed. #1 I am continuing Jamie silver collagen for Jamie week with Jamie [name] antibiotic. ABDs under 3 layer compression 2. Jamie [name] has been approved for Apligraf and I am going to attempt to apply Jamie first 1 of these next week 3. Still cannot rule out further debridements Electronic Signature(s) Signed: 09/25/2020 5:10:27 PM By: [name] MD Entered By: [name] on 09/25/2020 11:56:11 -------------------------------------------------------------------------------- SuperBill Details [name] Name: Date of Service: Jamie [name], Christean Grief. 09/25/2020 Jamie Record Number: [id number] [name] Account Number: [id number] Date of Birth/Sex: Treating RN: 11/11/1940 (80 y.o. 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 I87.332 Chronic venous hypertension (idiopathic) with ulcer and inflammation of left lower extremity Facility Procedures CPT4 Code: [id number] Description: (Facility Use Only) [phone] - APPLY MULTLAY COMPRS LWR LT LEG Modifier: Quantity: 1 Physician Procedures : CPT4 Code Description Modifier [id number] - WC PHYS LEVEL 3 - EST PT ICD-10 Diagnosis Description L97.922 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

## 2020-09-25 NOTE — Progress Notes (Signed)
Jamie [name], Jamie [name] ([id number]) Visit Report for 08/21/2020 Arrival Information Details Patient Name: Date of Service: Jamie [name], [name]. 08/21/2020 4:30 PM Medical Record Number: [id number] Patient Account Number: [id number] Date of Birth/Sex: Treating RN: [birth year] (80 y.o. [name] Primary Care Casyn Becvar: [name] Other Clinician: Referring Shandell Giovanni: Treating Jadan Hinojos/Extender: Vivia Budge in Treatment: 7 Visit Information History Since Last Visit Added or deleted any medications: No Patient Arrived: Wheel Chair Any new allergies or adverse reactions: No Arrival Time: 16:30 Had a fall or experienced change in No Accompanied By: niece activities of daily living that may affect Transfer Assistance: None risk of falls: Patient Identification Verified: Yes Signs or symptoms of abuse/neglect since last visito No Secondary Verification Process Completed: Yes Hospitalized since last visit: No Patient Requires Transmission-Based Precautions: No Implantable device outside of the clinic excluding No Patient Has Alerts: Yes cellular tissue based products placed in the center Patient Alerts: L ABI: 1.06; R ABI: 1.01 since last visit: Has Dressing in Place as Prescribed: Yes Has Compression in Place as Prescribed: No Pain Present Now: No Electronic Signature(s) Signed: 09/25/2020 5:36:56 PM By: Zandra Abts RN, BSN Entered By: Zandra Abts on 09/05/2020 17:19:39 -------------------------------------------------------------------------------- Compression Therapy Details Patient Name: Date of Service: Jamie [name], [name] 08/21/2020 4:30 PM Medical Record Number: [id number] Patient Account Number: [id number] Date of Birth/Sex: Treating RN: May 13, 1940 (79 y.o. [name] Primary Care Ayden Hardwick: [name] Other Clinician: Referring Aeric Burnham: Treating Stepheni Cameron/Extender: Vivia Budge in Treatment: 7 Compression Therapy Performed for Wound Assessment: Wound #3 Left,Distal,Medial Lower Leg Performed By: Clinician Zandra Abts, RN Compression Type: Three Emergency planning/management officer) Signed: 09/25/2020 5:36:56 PM By: Zandra Abts RN, BSN Entered By: Zandra Abts on 09/05/2020 17:20:05 -------------------------------------------------------------------------------- Compression Therapy Details Patient Name: Date of Service: Jamie [name], Christean Grief. 08/21/2020 4:30 PM Medical Record Number: [id number] Patient Account Number: [id number] Date of Birth/Sex: Treating RN: 1940/12/17 (80 y.o. [name] Primary Care Aldrick Derrig: [name] Other Clinician: Referring Nori Winegar: Treating Dervin Vore/Extender: Vivia Budge in Treatment: 7 Compression Therapy Performed for Wound Assessment: Wound #4 Left,Posterior Lower Leg Performed By: Clinician Zandra Abts, RN Compression Type: Three Emergency planning/management officer) Signed: 09/25/2020 5:36:56 PM By: Zandra Abts RN, BSN Entered By: Zandra Abts on 09/05/2020 17:20:05 -------------------------------------------------------------------------------- Encounter Discharge Information Details Patient Name: Date of Service: Jamie [name], [name] 08/21/2020 4:30 PM Medical Record Number: [id number] Patient Account Number: [id number] Date of Birth/Sex: Treating RN: September 26, 1940 (80 y.o. [name] Primary Care Treniece Holsclaw: [name] Other Clinician: Referring Gyselle Matthew: Treating Taylin Leder/Extender: Vivia Budge in Treatment: 7 Encounter Discharge Information Items Discharge Condition: Stable Ambulatory Status: Wheelchair Discharge Destination: Home Transportation: Private Auto Accompanied By: niece Schedule Follow-up Appointment: Yes Clinical Summary of Care: Patient  Declined Electronic Signature(s) Signed: 09/25/2020 5:36:56 PM By: Zandra Abts RN, BSN Entered By: Zandra Abts on 09/05/2020 17:20:43 -------------------------------------------------------------------------------- Wound Assessment Details Patient Name: Date of Service: Jamie [name]. 08/21/2020 4:30 PM Medical Record Number: [id number] Patient Account Number: [id number] Date of Birth/Sex: Treating RN: 1940-05-17 (79 y.o. [name] Primary Care Blue Winther: [name] Other Clinician: Referring Sharonica Kraszewski: Treating Tereka Thorley/Extender: [name] Weeks in Treatment: 7 Wound Status Wound Number: 3 Primary Etiology: Venous Leg Ulcer Wound Location: Left, Distal, Medial Lower Leg Wound Status: Open Wounding Event: Gradually Appeared Date Acquired: 04/09/2019 Weeks Of Treatment: 7 Clustered Wound: No Wound Measurements Length: (cm) 8.9 Width: (cm) 8 Depth: (cm) 0.2  Area: (cm) 55.92 Volume: (cm) 11.184 % Reduction in Area: -689.4% % Reduction in Volume: -689.3% Wound Description Classification: Full Thickness Without Exposed Support Structur es Electronic Signature(s) Signed: 09/25/2020 5:36:56 PM By: Zandra Abts RN, BSN Entered By: Zandra Abts on 09/05/2020 17:19:52 -------------------------------------------------------------------------------- Wound Assessment Details Patient Name: Date of Service: Jamie [name], [name] 08/21/2020 4:30 PM Medical Record Number: [id number] Patient Account Number: [id number] Date of Birth/Sex: Treating RN: 1941-03-11 (80 y.o. [name] Primary Care Jayshawn Colston: [name] Other Clinician: Referring Cassandr Cederberg: Treating Lamiyah Schlotter/Extender: [name] Weeks in Treatment: 7 Wound Status Wound Number: 4 Primary Etiology: Venous Leg Ulcer Wound Location: Left, Posterior Lower Leg Wound Status: Open Wounding Event: Gradually  Appeared Date Acquired: 04/09/2019 Weeks Of Treatment: 7 Clustered Wound: No Wound Measurements Length: (cm) 2.1 Width: (cm) 1.9 Depth: (cm) 0.2 Area: (cm) 3.134 Volume: (cm) 0.627 % Reduction in Area: -299.2% % Reduction in Volume: -299.4% Wound Description Classification: Full Thickness Without Exposed Support Structur es Electronic Signature(s) Signed: 09/25/2020 5:36:56 PM By: Zandra Abts RN, BSN Entered By: Zandra Abts on 09/05/2020 17:19:52

## 2020-09-28 NOTE — Progress Notes (Signed)
CHRISTINE, MORTON ([id number]) Visit Report for 09/25/2020 Arrival Information Details Patient Name: Date of Service: [name], [name]. 09/25/2020 11:00 A M Medical Record Number: [id number] Patient Account Number: [id number] Date of Birth/Sex: Treating RN: Jun 25, 1940 (80 y.o. [name] Primary Care Vernis Eid: [name] Other Clinician: Referring Jaselle Pryer: Treating Leidy Massar/Extender: Vivia Budge in Treatment: 12 Visit Information History Since Last Visit Added or deleted any medications: No Patient Arrived: Wheel Chair Any new allergies or adverse reactions: No Arrival Time: 11:28 Had a fall or experienced change in No Accompanied By: niece activities of daily living that may affect Transfer Assistance: None risk of falls: Patient Identification Verified: Yes Signs or symptoms of abuse/neglect since last visito No Secondary Verification Process Completed: Yes Hospitalized since last visit: No Patient Requires Transmission-Based Precautions: No Implantable device outside of the clinic excluding No Patient Has Alerts: Yes cellular tissue based products placed in the center Patient Alerts: L ABI: 1.06; R ABI: 1.01 since last visit: Has Dressing in Place as Prescribed: Yes Has Compression in Place as Prescribed: Yes Pain Present Now: Yes Electronic Signature(s) Signed: 09/25/2020 5:37:12 PM By: Zandra Abts RN, BSN Entered By: Zandra Abts on 09/25/2020 11:28:36 -------------------------------------------------------------------------------- Compression Therapy Details Patient Name: Date of Service: Gevena Mart W. 09/25/2020 11:00 A M Medical Record Number: [id number] Patient Account Number: [id number] Date of Birth/Sex: Treating RN: 03/31/1941 (80 y.o. [name] Primary Care Kharis Lapenna: [name] Other Clinician: Referring Margia Wiesen: Treating Aya Geisel/Extender: Vivia Budge in Treatment: 12 Compression Therapy Performed for Wound Assessment: Wound #3 Left,Distal,Medial Lower Leg Performed By: Clinician Zandra Abts, RN Compression Type: Three Layer Post Procedure Diagnosis Same as Pre-procedure Electronic Signature(s) Signed: 09/25/2020 5:37:12 PM By: Zandra Abts RN, BSN Entered By: Zandra Abts on 09/25/2020 11:42:21 -------------------------------------------------------------------------------- Compression Therapy Details Patient Name: Date of Service: Gevena Mart W. 09/25/2020 11:00 A M Medical Record Number: [id number] Patient Account Number: [id number] Date of Birth/Sex: Treating RN: [birth year] (80 y.o. [name] Primary Care Javohn Basey: [name] Other Clinician: Referring Temiloluwa Recchia: Treating Jisel Fleet/Extender: Vivia Budge in Treatment: 12 Compression Therapy Performed for Wound Assessment: Wound #4 Left,Posterior Lower Leg Performed By: Clinician Zandra Abts, RN Compression Type: Three Layer Post Procedure Diagnosis Same as Pre-procedure Electronic Signature(s) Signed: 09/25/2020 5:37:12 PM By: Zandra Abts RN, BSN Entered By: Zandra Abts on 09/25/2020 11:42:21 -------------------------------------------------------------------------------- Encounter Discharge Information Details Patient Name: Date of Service: [name], [name] 09/25/2020 11:00 A M Medical Record Number: [id number] Patient Account Number: [id number] Date of Birth/Sex: Treating RN: 1940-07-03 (80 y.o. [name] Primary Care Lynanne Delgreco: [name] Other Clinician: Referring Lameka Disla: Treating Anylah Scheib/Extender: Vivia Budge in Treatment: 12 Encounter Discharge Information Items Discharge Condition: Stable Ambulatory Status: Wheelchair Discharge Destination: Home Transportation: Private  Auto Accompanied By: daughter Schedule Follow-up Appointment: Yes Clinical Summary of Care: Electronic Signature(s) Signed: 09/25/2020 6:33:53 PM By: Shawn Stall Entered By: Shawn Stall on 09/25/2020 12:25:15 -------------------------------------------------------------------------------- Lower Extremity Assessment Details Patient Name: Date of Service: [name], [name]. 09/25/2020 11:00 A M Medical Record Number: [id number] Patient Account Number: [id number] Date of Birth/Sex: Treating RN: [birth year] (80 y.o. [name] Primary Care Agusta Hackenberg: [name] Other Clinician: Referring Sotiria Keast: Treating Lismary Kiehn/Extender: [name] Weeks in Treatment: 12 Edema Assessment Assessed: [Left: No] [Right: No] Edema: [Left: Ye] [Right: s] Calf Left: Right: Point of Measurement: 29 cm From Medial Instep 34 cm Ankle Left: Right: Point of Measurement:  11 cm From Medial Instep 25 cm Vascular Assessment Pulses: Dorsalis Pedis Palpable: [Left:Yes] Electronic Signature(s) Signed: 09/25/2020 5:37:12 PM By: Zandra Abts RN, BSN Entered By: Zandra Abts on 09/25/2020 11:35:51 -------------------------------------------------------------------------------- Multi Wound Chart Details Patient Name: Date of Service: [name], [name] 09/25/2020 11:00 A M Medical Record Number: [id number] Patient Account Number: [id number] Date of Birth/Sex: Treating RN: 03/24/1941 (80 y.o. [name] Primary Care Jax Kentner: [name] Other Clinician: Referring Dorothia Passmore: Treating Kais Monje/Extender: Vivia Budge in Treatment: 12 Vital Signs Height(in): 63 Pulse(bpm): 74 Weight(lbs): 150 Blood Pressure(mmHg): 115/71 Body Mass Index(BMI): 27 Temperature(F): 98.7 Respiratory Rate(breaths/min): 16 Photos: [3:No Photos Left, Distal, Medial Lower Leg] [4:No Photos Left, Posterior Lower  Leg] [N/A:N/A N/A] Wound Location: [3:Gradually Appeared] [4:Gradually Appeared] [N/A:N/A] Wounding Event: [3:Venous Leg Ulcer] [4:Venous Leg Ulcer] [N/A:N/A] Primary Etiology: [3:Sleep Apnea, Hypertension, Peripheral Sleep Apnea, Hypertension, Peripheral N/A] Comorbid History: [3:Venous Disease, Osteoarthritis, Neuropathy 04/09/2019] [4:Venous Disease, Osteoarthritis, Neuropathy 04/09/2019] [N/A:N/A] Date Acquired: [3:12] [4:12] [N/A:N/A] Weeks of Treatment: [3:Open] [4:Open] [N/A:N/A] Wound Status: [3:9x6.4x0.1] [4:1.4x1x0.1] [N/A:N/A] Measurements L x W x D (cm) [3:45.239] [4:1.1] [N/A:N/A] A (cm) : rea [3:4.524] [4:0.11] [N/A:N/A] Volume (cm) : [3:-538.60%] [4:-40.10%] [N/A:N/A] % Reduction in Area: [3:-219.30%] [4:29.90%] [N/A:N/A] % Reduction in Volume: [3:Full Thickness Without Exposed] [4:Full Thickness Without Exposed] [N/A:N/A] Classification: [3:Support Structures Large] [4:Support Structures Medium] [N/A:N/A] Exudate Amount: [3:Serosanguineous] [4:Serosanguineous] [N/A:N/A] Exudate Type: [3:red, brown] [4:red, brown] [N/A:N/A] Exudate Color: [3:Flat and Intact] [4:Distinct, outline attached] [N/A:N/A] Wound Margin: [3:Large (67-100%)] [4:Large (67-100%)] [N/A:N/A] Granulation Amount: [3:Red] [4:Red] [N/A:N/A] Granulation Quality: [3:Small (1-33%)] [4:Small (1-33%)] [N/A:N/A] Necrotic Amount: [3:Fat Layer (Subcutaneous Tissue): Yes Fat Layer (Subcutaneous Tissue): Yes N/A] Exposed Structures: [3:Fascia: No Tendon: No Muscle: No Joint: No Bone: No Small (1-33%)] [4:Fascia: No Tendon: No Muscle: No Joint: No Bone: No Small (1-33%)] [N/A:N/A] Epithelialization: [3:Compression Therapy] [4:Compression Therapy] [N/A:N/A] Treatment Notes Electronic Signature(s) Signed: 09/25/2020 5:10:27 PM By: [name] MD Signed: 09/25/2020 5:37:12 PM By: Zandra Abts RN, BSN Entered By: [name] on 09/25/2020  11:51:45 -------------------------------------------------------------------------------- Multi-Disciplinary Care Plan Details Patient Name: Date of Service: [name], [name] 09/25/2020 11:00 A M Medical Record Number: [id number] Patient Account Number: [id number] Date of Birth/Sex: Treating RN: 06/12/1940 (80 y.o. 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 Compression as ordered Notes: Wound/Skin Impairment Nursing Diagnoses: Impaired tissue integrity Goals: Patient/caregiver will verbalize understanding of skin care regimen Date Initiated: 06/30/2020 Target Resolution Date: 10/13/2020 Goal Status: Active Ulcer/skin breakdown will have a volume reduction of 30% by week 4 Date Initiated: 06/30/2020 Date Inactivated: 08/07/2020 Target Resolution Date: 08/04/2020 Goal Status: Unmet Unmet Reason: non viable surface Interventions: Assess patient/caregiver ability to obtain necessary supplies Assess patient/caregiver ability to perform ulcer/skin care regimen upon admission and as needed Assess ulceration(s) every visit Provide education on ulcer and skin care Treatment Activities: Skin care regimen initiated : 06/30/2020 Topical wound management initiated : 06/30/2020 Notes: Electronic Signature(s) Signed: 09/25/2020 5:37:12 PM By: Zandra Abts RN, BSN Entered By: Zandra Abts on 09/25/2020 11:45:13 -------------------------------------------------------------------------------- Pain Assessment Details Patient  Name: Date of Service: Gevena Mart W. 09/25/2020 11:00 A M Medical Record Number: [id number] Patient Account Number: [id number] Date of Birth/Sex: Treating  RN: 1940/11/01 (80 y.o. [name] Primary Care Elizeo Rodriques: [name] Other Clinician: Referring Jerad Dunlap: Treating Vasilis Luhman/Extender: Vivia Budge in Treatment: 12 Active Problems Location of Pain Severity and Description of Pain Patient Has Paino Yes Site Locations Pain Location: Pain in Ulcers With Dressing Change: Yes Duration of the Pain. Constant / Intermittento Intermittent Rate the pain. Current Pain Level: 6 Character of Pain Describe the Pain: Burning Pain Management and Medication Current Pain Management: Medication: Yes Cold Application: No Rest: No Massage: No Activity: No T.E.N.S.: No Heat Application: No Leg drop or elevation: No Is the Current Pain Management Adequate: Adequate How does your wound impact your activities of daily livingo Sleep: No Bathing: No Appetite: No Relationship With Others: No Bladder Continence: No Emotions: No Bowel Continence: No Work: No Toileting: No Drive: No Dressing: No Hobbies: No Psychologist, prison and probation services) Signed: 09/25/2020 5:37:12 PM By: Zandra Abts RN, BSN Entered By: Zandra Abts on 09/25/2020 11:29:09 -------------------------------------------------------------------------------- Patient/Caregiver Education Details Patient Name: Date of Service: WILSO N, MA [name] 6/20/2022andnbsp11:00 A M Medical Record Number: [id number] Patient Account Number: [id number] Date of Birth/Gender: Treating RN: October 22, 1940 (80 y.o. [name] Primary Care Physician: [name] Other Clinician: Referring Physician: Treating Physician/Extender: Vivia Budge in Treatment: 12 Education Assessment Education Provided To: Patient Education Topics  Provided Wound/Skin Impairment: Methods: Explain/Verbal Responses: State content correctly [employer]) Signed: 09/25/2020 5:37:12 PM By: Zandra Abts RN, BSN Entered By: Zandra Abts on 09/25/2020 13:13:33 -------------------------------------------------------------------------------- Wound Assessment Details Patient Name: Date of Service: Gevena Mart W. 09/25/2020 11:00 A M Medical Record Number: [id number] Patient Account Number: [id number] Date of Birth/Sex: Treating RN: April 03, 1941 (80 y.o. [name] Primary Care Milan Clare: [name] Other Clinician: Referring Rosella Crandell: Treating Hernando Reali/Extender: [name] Weeks in Treatment: 12 Wound Status Wound Number: 3 Primary Venous Leg Ulcer Etiology: Wound Location: Left, Distal, Medial Lower Leg Wound Open Wounding Event: Gradually Appeared Status: Date Acquired: 04/09/2019 Comorbid Sleep Apnea, Hypertension, Peripheral Venous Disease, Weeks Of Treatment: 12 History: Osteoarthritis, Neuropathy Clustered Wound: No Photos Wound Measurements Length: (cm) 9 Width: (cm) 6.4 Depth: (cm) 0.1 Area: (cm) 45.239 Volume: (cm) 4.524 % Reduction in Area: -538.6% % Reduction in Volume: -219.3% Epithelialization: Small (1-33%) Tunneling: No Undermining: No Wound Description Classification: Full Thickness Without Exposed Support Structures Wound Margin: Flat and Intact Exudate Amount: Large Exudate Type: Serosanguineous Exudate Color: red, brown Foul Odor After Cleansing: No Slough/Fibrino Yes Wound Bed Granulation Amount: Large (67-100%) Exposed Structure Granulation Quality: Red Fascia Exposed: No Necrotic Amount: Small (1-33%) Fat Layer (Subcutaneous Tissue) Exposed: Yes Necrotic Quality: Adherent Slough Tendon Exposed: No Muscle Exposed: No Joint Exposed: No Bone Exposed: No Treatment Notes Wound #3 (Lower Leg) Wound Laterality: Left, Medial,  Distal Cleanser Soap and Water Discharge Instruction: May shower and wash wound with dial antibacterial soap and water prior to dressing change. Wound Cleanser Discharge Instruction: Cleanse the wound with wound cleanser prior to applying a clean dressing using gauze sponges, not tissue or cotton balls. Peri-Wound Care Triamcinolone 15 (g) Discharge Instruction: In clinic only. Apply to red, irritated periwound Sween Lotion (Moisturizing lotion) Discharge Instruction: apply at home. Apply moisturizing lotion as directed Topical Keystone Antibiotic Compound Discharge Instruction: Apply directly to wound bed under Sorbact Primary Dressing Promogran Prisma Matrix, 4.34 (sq in) (silver collagen) Discharge Instruction: Moisten collagen with saline or hydrogel Secondary Dressing Woven Gauze Sponge, Non-Sterile 4x4 in Discharge Instruction: Apply over primary dressing as directed. ABD Pad, 8x10 Discharge Instruction: Apply  over primary dressing as directed. CarboFLEX Odor Control Dressing, 4x4 in Discharge Instruction: Apply over primary dressing in office only Secured With Compression Wrap ThreePress (3 layer compression wrap) Discharge Instruction: Apply three layer compression as directed. Compression Stockings Add-Ons Electronic Signature(s) Signed: 09/25/2020 5:37:12 PM By: Zandra AbtsLynch, Shatara RN, BSN Signed: 09/28/2020 8:38:46 AM By: Rolan LipaPriddy, Jennifer Entered By: Rolan LipaPriddy, Jennifer on 09/25/2020 14:14:34 -------------------------------------------------------------------------------- Wound Assessment Details Patient Name: Date of Service: Glennis BrinkWILSO N, Harrell GaveMA [name] 09/25/2020 11:00 A M Medical Record Number: [id number] Patient Account Number: [id number] Date of Birth/Sex: Treating RN: 01/17/1941 (80 y.o. Wynelle LinkF) Lynch, Shatara Primary Care Ronika Kelson: Delorise JacksonEggleston-Clark, [name] Other Clinician: Referring Bryonna Sundby: Treating Kasch Borquez/Extender: Conard Novakobson, Michael Eggleston-Clark, [name] in  Treatment: 12 Wound Status Wound Number: 4 Primary Venous Leg Ulcer Etiology: Wound Location: Left, Posterior Lower Leg Wound Open Wounding Event: Gradually Appeared Status: Date Acquired: 04/09/2019 Comorbid Sleep Apnea, Hypertension, Peripheral Venous Disease, Weeks Of Treatment: 12 History: Osteoarthritis, Neuropathy Clustered Wound: No Photos Wound Measurements Length: (cm) 1.4 Width: (cm) 1 Depth: (cm) 0.1 Area: (cm) 1.1 Volume: (cm) 0.11 % Reduction in Area: -40.1% % Reduction in Volume: 29.9% Epithelialization: Small (1-33%) Tunneling: No Undermining: No Wound Description Classification: Full Thickness Without Exposed Support Structures Wound Margin: Distinct, outline attached Exudate Amount: Medium Exudate Type: Serosanguineous Exudate Color: red, brown Foul Odor After Cleansing: No Slough/Fibrino Yes Wound Bed Granulation Amount: Large (67-100%) Exposed Structure Granulation Quality: Red Fascia Exposed: No Necrotic Amount: Small (1-33%) Fat Layer (Subcutaneous Tissue) Exposed: Yes Necrotic Quality: Adherent Slough Tendon Exposed: No Muscle Exposed: No Joint Exposed: No Bone Exposed: No Treatment Notes Wound #4 (Lower Leg) Wound Laterality: Left, Posterior Cleanser Soap and Water Discharge Instruction: May shower and wash wound with dial antibacterial soap and water prior to dressing change. Wound Cleanser Discharge Instruction: Cleanse the wound with wound cleanser prior to applying a clean dressing using gauze sponges, not tissue or cotton balls. Peri-Wound Care Triamcinolone 15 (g) Discharge Instruction: In clinic only. Apply to red, irritated periwound Sween Lotion (Moisturizing lotion) Discharge Instruction: apply at home. Apply moisturizing lotion as directed Topical Keystone Antibiotic Compound Discharge Instruction: Apply directly to wound bed under Sorbact Primary Dressing Promogran Prisma Matrix, 4.34 (sq in) (silver collagen) Discharge  Instruction: Moisten collagen with saline or hydrogel Secondary Dressing Woven Gauze Sponge, Non-Sterile 4x4 in Discharge Instruction: Apply over primary dressing as directed. ABD Pad, 8x10 Discharge Instruction: Apply over primary dressing as directed. CarboFLEX Odor Control Dressing, 4x4 in Discharge Instruction: Apply over primary dressing in office only Secured With Compression Wrap ThreePress (3 layer compression wrap) Discharge Instruction: Apply three layer compression as directed. Compression Stockings Add-Ons Electronic Signature(s) Signed: 09/25/2020 5:37:12 PM By: Zandra AbtsLynch, Shatara RN, BSN Signed: 09/28/2020 8:38:46 AM By: Rolan LipaPriddy, Jennifer Entered By: Rolan LipaPriddy, Jennifer on 09/25/2020 14:13:59 -------------------------------------------------------------------------------- Vitals Details Patient Name: Date of Service: Glennis BrinkWILSO N, MA [name] 09/25/2020 11:00 A M Medical Record Number: [id number] Patient Account Number: [id number] Date of Birth/Sex: Treating RN: 05/21/1940 (80 y.o. Wynelle LinkF) Lynch, Shatara Primary Care Karn Derk: Delorise JacksonEggleston-Clark, [name] Other Clinician: Referring Khyrie Masi: Treating Rosamary Boudreau/Extender: Vivia Budgeobson, Michael Eggleston-Clark, [name] in Treatment: 12 Vital Signs Time Taken: 11:28 Temperature (F): 98.7 Height (in): 63 Pulse (bpm): 74 Weight (lbs): 150 Respiratory Rate (breaths/min): 16 Body Mass Index (BMI): 26.6 Blood Pressure (mmHg): 115/71 Reference Range: 80 - 120 mg / dl Electronic Signature(s) Signed: 09/25/2020 5:37:12 PM By: Zandra AbtsLynch, Shatara RN, BSN Entered By: Zandra AbtsLynch, Shatara on 09/25/2020 11:28:52

## 2020-10-05 ENCOUNTER — Encounter (HOSPITAL_BASED_OUTPATIENT_CLINIC_OR_DEPARTMENT_OTHER): Payer: Medicare Other | Admitting: Internal Medicine

## 2020-10-10 ENCOUNTER — Encounter (HOSPITAL_BASED_OUTPATIENT_CLINIC_OR_DEPARTMENT_OTHER): Payer: Medicare Other | Attending: Internal Medicine | Admitting: Internal Medicine

## 2020-10-10 ENCOUNTER — Other Ambulatory Visit: Payer: Self-pay

## 2020-10-10 DIAGNOSIS — G629 Polyneuropathy, unspecified: Secondary | ICD-10-CM | POA: Insufficient documentation

## 2020-10-10 DIAGNOSIS — L97922 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: Secondary | ICD-10-CM | POA: Diagnosis not present

## 2020-10-11 NOTE — Progress Notes (Signed)
Jamie [name], Jamie W. ([id number]) Visit Report for 10/10/2020 Cellular or Tissue Based Product Details Patient Name: Date of Service: Jamie [name], [place] [name] 10/10/2020 12:45 PM Medical Record Number: [id number] Patient Account Number: [id number] Date of Birth/Sex: Treating RN: 06/28/1940 (80 y.o. Jamie [name]) [name], Jamie Primary Care Provider: Delorise [name], Jamie Other Clinician: Referring Provider: Treating Provider/Extender: Vivia Budgeobson, Lus Kriegel [name], Jamie Weeks in Treatment: 14 Cellular or Tissue Based Product Type Wound #3 Left,Distal,Medial Lower Leg Applied to: Performed By: Physician Maxwell Caulobson, Tremell Reimers G., MD Cellular or Tissue Based Product Type: Apligraf Level of Consciousness (Pre-procedure): Awake and Alert Pre-procedure Verification/Time Out Yes - 14:08 Taken: Location: trunk / arms / legs Wound Size (sq cm): 22.96 Product Size (sq cm): 44 Waste Size (sq cm): 0 Amount of Product Applied (sq cm): 44 Instrument Used: Blade, Forceps, Scissors Lot #: [id number] Expiration Date: 10/11/2020 Fenestrated: No Reconstituted: Yes Solution Type: Normal Saline Solution Amount: 10cc Lot #: [id number] Solution Expiration Date: 12/07/2021 Secured: Yes Secured With: Steri-Strips Dressing Applied: Yes Primary Dressing: Adaptic T ouch Response to Treatment: Procedure was tolerated well Level of Consciousness (Post- Awake and Alert procedure): Post Procedure Diagnosis Same as Pre-procedure Electronic Signature(s) Signed: 10/11/2020 1:36:13 PM By: Baltazar Najjarobson, Deundra Furber MD Entered By: Baltazar Najjarobson, Malaisha Silliman on 10/10/2020 14:19:59 -------------------------------------------------------------------------------- HPI Details Patient Name: Date of Service: Jamie [name], [place] [name] 10/10/2020 12:45 PM Medical Record Number: [id number] Patient Account Number: [id number] Date of Birth/Sex: Treating RN: 08/26/1940 (80 y.o. Jamie [name]) [name], Jamie Primary Care Provider: Delorise [name], Jamie Other  Clinician: Referring Provider: Treating Provider/Extender: Vivia Budgeobson, Benedicto Capozzi [name], Jamie Weeks in Treatment: 14 History of Present Illness HPI Description: ADMISSION 06/30/2020 Jamie [name] is an 80 year old woman who lives in [place]. She is here with her niece for review of wounds on the left medial lower leg and ankle. These have apparently been present for over a year and she followed with Dr. Olegario MessierKathy at the [facility] in [place] for quite a period of time although it looks as though there was a initial consult wound from Dr. Marcha Soldersathey on February 18 presumably there was therefore hiatus. At that point the wounds were described as being there for 3 months. She also tells me she was at the wound care center in [place] for a period of time with this. 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 She had venous studies that were negative for DVT ABIs on the right were 1.01 on the left 1.06. She has . had previous applications of puraply, compression which she does not tolerate very well. She has had several rounds of oral antibiotic therapy. She complains of unrelenting pain and she is seeing Dr. Reece AgarEspy V of pain management apparently was on oxycodone but that did not help. She also had a skin biopsy done by Dr. Olegario MessierKathy although we do not have that result. She does not appear to have an arterial issue. I am not completely clear what she has been putting on the wounds lately. 3/31; this patient has a particularly nasty set of wounds on the left medial ankle in the middle of what looks to be hemosiderin deposition secondary to chronic venous insufficiency. She has a lot of pain followed by pain management. Dr. Marcha Soldersathey apparently did a biopsy of something on the left leg last fall what I would like to get this result. She has a history of MRSA treatment. I do not believe she had reflux studies but she did have DVT rule out studies.  Previous ABIs have not  suggested arterial insufficiency 4/7; difficult wounds on the left medial ankle probably chronic venous insufficiency. With considerable effort on behalf of our case manager we were able to finally to speak to somebody at the hospital in [place] who indicated that no biopsy of this area have been done even though the patient describes this in some detail and is even able to point out where she thinks the biopsy was done. We have been using Sorbact. The PCR culture I did showed polymicrobial identification with Pseudomonas, staph aureus, Peptostreptococcus. All of this and low titers. Resistance genes identified were MRSA, staph virulence gene and tetracycline. We are going to send this to [facility] for a topical antibiotic which is something that we have had good success with recently in large venous ulcers with a lot of purulent drainage. There would not be an easy oral alternative here possibly line escalated and ciprofloxacin if we need to use systemic antibiotic 4/15; difficult area on the left medial ankle. Most likely chronic venous insufficiency. I think she will probably need venous reflux study I think she had DVT rule outs but not venous reflux studies. We have not yet obtained the topical antibiotics. She has home health changing the dressing we have been using Sorbact for adherent fibrinous debris on the surface. Very difficult to remove 4/22; patient presents for 1 week follow-up. She has been using sore back under compression wraps and these are changed 3 times a week with home health. She also had Keystone antibiotics sent to her house and brought them in today. She has no complaints or issues today. 5/2; patient is here for follow-up. She has been using Sorbact under compression. Very painful wound. She has been using Keystone antibiotics. Not much improvement although the more medial part of the wound has cleaned up nicely and the larger part of the wound about 50%  slough covered. Part of the issue here is that she had stays at both [facility] wound care center, [facility] wound care center and now [place]. Not sure if she has had venous reflux studies. As far as we are able to tell she did not have a biopsy. My notes state that she did not have venous reflux studies just DVT rule outs. 5/16; patient goes for venous reflux studies this afternoon. She says that wound was biopsied which sounds like punch biopsies by Dr. Lynden Ang we do not have these results. We are using Sorbact. Very difficult wound to debride 5/23; patient presents for 1 week follow-up. She reports tolerating the wraps well with sorbact underneath. She had ABIs and venous reflux studies done. She has no issues or complaints today. She denies acute signs of infection. 6/6; I have reviewed the patient's vascular studies. Wounds are on the left medial and posterior calf. She had significant reflux in the greater saphenous vein in the in the mid thigh, distal thigh knee and the small saphenous vein in the popliteal fossa. The vein diameters do not look too impressive though. She is going to see the vascular surgeon on Wednesday. She also had venous reflux in the right common femoral vein. She did not have any evidence of a DVT or SVT I am wondering whether there is an ablation procedure that would benefit her in the greater saphenous vein on the left. . She tells [place] that home health put the dressing on too tight and she took off 1 layer. The swelling in her left leg is a little worse as a result of this. Not much  change in the wound measurements so the surface of the wound looks better Her arterial studies showed an ABI on the right of 1.14 with a triphasic waveform and a great toe pressure of 0.89. On the left her ABIs were noncompressible at 1.34 but with triphasic waveforms and a TBI of 0.97. Her greater toe pressure was 122. There was no evidence of significant bilateral arterial disease 6/20 patient went  to see Dr. Durwin Nora. He did not think she had significant arterial disease. In terms of her venous duplex on the right side there was no evidence of a DVT or SVT there was deep venous reflux involving the common femoral vein no superficial vein reflux on the left side there was no DVT or SVT there was no deep vein graft reflux there was some reflux in the greater saphenous vein from the mid thigh to the knee but the vein here was not dilated. He thought these were venous wounds he prescribed a compression pump but I am not sure who we ordered it from The patient has been approved for Apligraf. Still using silver collagen this week 7/5; Apligraf #1 Electronic Signature(s) Signed: 10/11/2020 1:36:13 PM By: [name] MD Entered By: [name] on 10/10/2020 14:20:33 -------------------------------------------------------------------------------- Physical Exam Details Patient Name: Date of Service: [name] 10/10/2020 12:45 PM Medical Record Number: [id number] Patient Account Number: [id number] Date of Birth/Sex: Treating RN: 16-Jul-1940 (80 y.o. Jamie Cluck Primary Care Provider: [name] Other Clinician: Referring Provider: Treating Provider/Extender: Vivia Budge in Treatment: 14 Constitutional Sitting or standing Blood Pressure is within target range for patient.. Pulse regular and within target range for patient.Marland Kitchen Respirations regular, non-labored and within target range.. Temperature is normal and within the target range for the patient.Marland Kitchen Appears in no distress. Notes Wound exam; left anterior lower leg and the medial part of the left calf. The area more posteriorly has a think totally epithelialized. Wound surface look quite good no need for mechanical debridement. Apligraf #1 applied in the standard fashion Electronic Signature(s) Signed: 10/11/2020 1:36:13 PM By: [name] MD Entered By: [name] on  10/10/2020 14:21:44 -------------------------------------------------------------------------------- Physician Orders Details Patient Name: Date of Service: Jamie Brink, Christean Grief. 10/10/2020 12:45 PM Medical Record Number: [id number] Patient Account Number: [id number] Date of Birth/Sex: Treating RN: [birth year] (80 y.o. Jamie Cluck Primary Care Provider: [name] Other Clinician: Referring Provider: Treating Provider/Extender: Vivia Budge in Treatment: 14 Verbal / Phone Orders: No Diagnosis Coding Follow-up Appointments ppointment in 2 weeks. - with Dr. Leanord Hawking Return A Cellular or Tissue Based Products Cellular or Tissue Based Product Type: - Apligraf #1 10/10/20 Bathing/ Shower/ Hygiene May shower with protection but do not get wound dressing(s) wet. Edema Control - Lymphedema / SCD / Other Elevate legs to the level of the heart or above for 30 minutes daily and/or when sitting, a frequency of: - throughout the day 3-4 times a day. Avoid standing for long periods of time. Exercise regularly Additional Orders / Instructions Follow Nutritious Diet Home Health New wound care orders this week; continue Home Health for wound care. May utilize formulary equivalent dressing for wound treatment orders unless otherwise specified. - Change wrap and outer dressing, Do NOT remove skin substitute. Other Home Health Orders/Instructions: - [facility] fax #[phone] Wound Treatment Wound #3 - Lower Leg Wound Laterality: Left, Medial, Distal Cleanser: Soap and Water (Home Health) 2 x Per Week/30 Days Discharge Instructions: May shower and wash wound  with dial antibacterial soap and water prior to dressing change. Cleanser: Wound Cleanser (Home Health) 2 x Per Week/30 Days Discharge Instructions: Cleanse the wound with wound cleanser prior to applying a clean dressing using gauze sponges, not tissue or cotton  balls. Peri-Wound Care: Triamcinolone 15 (g) 2 x Per Week/30 Days Discharge Instructions: In clinic only. Apply to red, irritated periwound Peri-Wound Care: Sween Lotion (Moisturizing lotion) (Home Health) 2 x Per Week/30 Days Discharge Instructions: apply at home. Apply moisturizing lotion as directed Secondary Dressing: Woven Gauze Sponge, Non-Sterile 4x4 in (Home Health) 2 x Per Week/30 Days Discharge Instructions: Apply over primary dressing as directed. Secondary Dressing: ABD Pad, 8x10 (Home Health) 2 x Per Week/30 Days Discharge Instructions: Apply over primary dressing as directed. Secondary Dressing: ADAPTIC TOUCH 3x4.25 in 2 x Per Week/30 Days Discharge Instructions: Apply over primary dressing as directed. Compression Wrap: ThreePress (3 layer compression wrap) 2 x Per Week/30 Days Discharge Instructions: Apply three layer compression as directed. Wound #4 - Lower Leg Wound Laterality: Left, Posterior Cleanser: Soap and Water (Home Health) 2 x Per Week/30 Days Discharge Instructions: May shower and wash wound with dial antibacterial soap and water prior to dressing change. Cleanser: Wound Cleanser (Home Health) 2 x Per Week/30 Days Discharge Instructions: Cleanse the wound with wound cleanser prior to applying a clean dressing using gauze sponges, not tissue or cotton balls. Peri-Wound Care: Triamcinolone 15 (g) 2 x Per Week/30 Days Discharge Instructions: In clinic only. Apply to red, irritated periwound Peri-Wound Care: Sween Lotion (Moisturizing lotion) (Home Health) 2 x Per Week/30 Days Discharge Instructions: apply at home. Apply moisturizing lotion as directed Secondary Dressing: Woven Gauze Sponge, Non-Sterile 4x4 in (Home Health) 2 x Per Week/30 Days Discharge Instructions: Apply over primary dressing as directed. Secondary Dressing: Optifoam Non-Adhesive Dressing, 4x4 in 2 x Per Week/30 Days Discharge Instructions: Apply over primary dressing as directed. Compression  Wrap: ThreePress (3 layer compression wrap) 2 x Per Week/30 Days Discharge Instructions: Apply three layer compression as directed. Electronic Signature(s) Signed: 10/10/2020 7:08:24 PM By: [name] Signed: 10/11/2020 1:36:13 PM By: [name] MD Entered By: [name] on 10/10/2020 14:21:03 -------------------------------------------------------------------------------- Problem List Details Patient Name: Date of Service: Jamie Brink, Christean Grief. 10/10/2020 12:45 PM Medical Record Number: [id number] Patient Account Number: [id number] Date of Birth/Sex: Treating RN: 14-Aug-1940 (80 y.o. 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 10/10/2020 12:45 PM Medical Record Number: [id number] Patient Account Number: [id number] Date of Birth/Sex: Treating RN: March 18, 1941 (80 y.o. Jamie Cluck Primary Care Provider: [name] Other Clinician: Referring Provider: Treating Provider/Extender: Vivia Budge in Treatment: 14 Subjective History of Present Illness (HPI) ADMISSION 06/30/2020 Mrs. Backer is an 80 year old woman who lives in [place]. She is  here with her niece for review of wounds on the left medial lower leg and ankle. These have apparently been present for over a year and she followed with Dr. Olegario Messier at the [facility]  in [place] for quite a period of time although it looks as though there was a initial consult wound from Dr. Marcha Solders on February 18 presumably there was therefore hiatus. At that point the wounds were described as being there for 3 months. She also tells me she was at the wound care center in [place] for a period of time with this. 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 She had venous studies that were negative for DVT ABIs on the right were 1.01 on the left 1.06. She has . had previous applications of puraply, compression which she does not tolerate very well. She has had several rounds of oral antibiotic therapy. She complains of unrelenting pain and she is seeing Dr. Reece Agar V of pain management apparently was on oxycodone but that did not help. She also had a skin biopsy done by Dr. Olegario Messier although we do not have that result. She does not appear to have an arterial issue. I am not completely clear what she has been putting on the wounds lately. 3/31; this patient has a particularly nasty set of wounds on the left medial ankle in the middle of what looks to be hemosiderin deposition secondary to chronic venous insufficiency. She has a lot of pain followed by pain management. Dr. Marcha Solders apparently did a biopsy of something on the left leg last fall what I would like to get this result. She has a history of MRSA treatment. I do not believe she had reflux studies but she did have DVT rule out studies. Previous ABIs have not suggested arterial insufficiency 4/7; difficult wounds on the left medial ankle probably chronic venous insufficiency. With considerable effort on behalf of our case manager we were able to finally to speak to somebody at the hospital in [place] who indicated  that no biopsy of this area have been done even though the patient describes this in some detail and is even able to point out where she thinks the biopsy was done. We have been using Sorbact. The PCR culture I did showed polymicrobial identification with Pseudomonas, staph aureus, Peptostreptococcus. All of this and low titers. Resistance genes identified were MRSA, staph virulence gene and tetracycline. We are going to send this to [facility] for a topical antibiotic which is something that we have had good success with recently in large venous ulcers with a lot of purulent drainage. There would not be an easy oral alternative here possibly line escalated and ciprofloxacin if we need to use systemic antibiotic 4/15; difficult area on the left medial ankle. Most likely chronic venous insufficiency. I think she will probably need venous reflux study I think she had DVT rule outs but not venous reflux studies. We have not yet obtained the topical antibiotics. She has home health changing the dressing we have been using Sorbact for adherent fibrinous debris on the surface. Very difficult to remove 4/22; patient presents for 1 week follow-up. She has been using sore back under compression wraps and these are changed 3 times a week with home health. She also had Keystone antibiotics sent to her house and brought them in today. She has no complaints or issues today. 5/2; patient is here for follow-up. She has been using Sorbact under compression. Very painful wound. She has been using Keystone antibiotics. Not much improvement although the more medial part of the wound has cleaned up nicely and the larger part of the wound about 50% slough covered. Part of the issue  here is that she had stays at both [facility] wound care center, [facility] wound care center and now [place]. Not sure if she has had venous reflux studies. As far as we are able to tell she did not have a biopsy. My notes state that she did not have  venous reflux studies just DVT rule outs. 5/16; patient goes for venous reflux studies this afternoon. She says that wound was biopsied which sounds like punch biopsies by Dr. Lynden Ang we do not have these results. We are using Sorbact. Very difficult wound to debride 5/23; patient presents for 1 week follow-up. She reports tolerating the wraps well with sorbact underneath. She had ABIs and venous reflux studies done. She has no issues or complaints today. She denies acute signs of infection. 6/6; I have reviewed the patient's vascular studies. Wounds are on the left medial and posterior calf. She had significant reflux in the greater saphenous vein in the in the mid thigh, distal thigh knee and the small saphenous vein in the popliteal fossa. The vein diameters do not look too impressive though. She is going to see the vascular surgeon on Wednesday. She also had venous reflux in the right common femoral vein. She did not have any evidence of a DVT or SVT I am wondering whether there is an ablation procedure that would benefit her in the greater saphenous vein on the left. . She tells [place] that home health put the dressing on too tight and she took off 1 layer. The swelling in her left leg is a little worse as a result of this. Not much change in the wound measurements so the surface of the wound looks better Her arterial studies showed an ABI on the right of 1.14 with a triphasic waveform and a great toe pressure of 0.89. On the left her ABIs were noncompressible at 1.34 but with triphasic waveforms and a TBI of 0.97. Her greater toe pressure was 122. There was no evidence of significant bilateral arterial disease 6/20 patient went to see Dr. Durwin Nora. He did not think she had significant arterial disease. In terms of her venous duplex on the right side there was no evidence of a DVT or SVT there was deep venous reflux involving the common femoral vein no superficial vein reflux on the left side there was no  DVT or SVT there was no deep vein graft reflux there was some reflux in the greater saphenous vein from the mid thigh to the knee but the vein here was not dilated. He thought these were venous wounds he prescribed a compression pump but I am not sure who we ordered it from The patient has been approved for Apligraf. Still using silver collagen this week 7/5; Apligraf #1 Objective Constitutional Sitting or standing Blood Pressure is within target range for patient.. Pulse regular and within target range for patient.Marland Kitchen Respirations regular, non-labored and within target range.. Temperature is normal and within the target range for the patient.Marland Kitchen Appears in no distress. Vitals Time Taken: 1:25 PM, Height: 63 in, Weight: 150 lbs, BMI: 26.6, Temperature: 98.7 F, Pulse: 80 bpm, Respiratory Rate: 18 breaths/min, Blood Pressure: 129/79 mmHg, Pulse Oximetry: 96 %. General Notes: Wound exam; left anterior lower leg and the medial part of the left calf. The area more posteriorly has a think totally epithelialized. Wound surface look quite good no need for mechanical debridement. Apligraf #1 applied in the standard fashion Integumentary (Hair, Skin) Wound #3 status is Open. Original cause of wound was Gradually  Appeared. The date acquired was: 04/09/2019. The wound has been in treatment 14 weeks. The wound is located on the Left,Distal,Medial Lower Leg. The wound measures 8.2cm length x 2.8cm width x 0.1cm depth; 18.033cm^2 area and 1.803cm^3 volume. There is Fat Layer (Subcutaneous Tissue) exposed. There is no tunneling or undermining noted. There is a large amount of serosanguineous drainage noted. The wound margin is flat and intact. There is large (67-100%) red, pink granulation within the wound bed. There is a small (1-33%) amount of necrotic tissue within the wound bed including Adherent Slough. Wound #4 status is Open. Original cause of wound was Gradually Appeared. The date acquired was: 04/09/2019. The  wound has been in treatment 14 weeks. The wound is located on the Left,Posterior Lower Leg. The wound measures 0.1cm length x 0.1cm width x 0.1cm depth; 0.008cm^2 area and 0.001cm^3 volume. There is no tunneling or undermining noted. There is a none present amount of drainage noted. The wound margin is distinct with the outline attached to the wound base. There is no granulation within the wound bed. There is no necrotic tissue within the wound bed. 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 A skin graft procedure using a bioengineered skin substitute/cellular or tissue based product was performed by Maxwell Caul., MD with the following instrument(s): Blade, Forceps, and Scissors. Apligraf was applied and secured with Steri-Strips. 44 sq cm of product was utilized and 0 sq cm was wasted. Post Application, Adaptic Touch was applied. A Time Out was conducted at 14:08, prior to the start of the procedure. The procedure was tolerated well. Post procedure Diagnosis Wound #3: Same as Pre-Procedure . 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 There was a Three Layer Compression Therapy Procedure by [name], RN. Post procedure Diagnosis Wound #3: Same as Pre-Procedure Wound #4 Pre-procedure diagnosis of Wound #4 is a Venous Leg Ulcer located on the Left,Posterior Lower Leg . There was a Three Layer Compression Therapy Procedure by [name], RN. Post procedure Diagnosis Wound #4: Same as Pre-Procedure Plan Follow-up Appointments: Return Appointment in 2 weeks. - with Dr. Leanord Hawking Cellular or Tissue Based Products: Cellular or Tissue  Based Product Type: - Apligraf #1 10/10/20 Bathing/ Shower/ Hygiene: May shower with protection but do not get wound dressing(s) wet. Edema Control - Lymphedema / SCD / Other: Elevate legs to the level of the heart or above for 30 minutes daily and/or when sitting, a frequency of: - throughout the day 3-4 times a day. Avoid standing for long periods of time. Exercise regularly Additional Orders / Instructions: Follow Nutritious Diet Home Health: New wound care orders this week; continue Home Health for wound care. May utilize formulary equivalent dressing for wound treatment orders unless otherwise specified. - Change wrap and outer dressing, Do NOT remove skin substitute. Other Home Health Orders/Instructions: - [facility] fax [phone] WOUND #3: - Lower Leg Wound Laterality: Left, Medial, Distal Cleanser: Soap and Water (Home Health) 2 x Per Week/30 Days Discharge Instructions: May shower and wash wound with dial antibacterial soap and water prior to dressing change. Cleanser: Wound Cleanser (Home Health) 2 x Per Week/30 Days Discharge Instructions: Cleanse the wound with wound cleanser prior to applying  a clean dressing using gauze sponges, not tissue or cotton balls. Peri-Wound Care: Triamcinolone 15 (g) 2 x Per Week/30 Days Discharge Instructions: In clinic only. Apply to red, irritated periwound Peri-Wound Care: Sween Lotion (Moisturizing lotion) (Home Health) 2 x Per Week/30 Days Discharge Instructions: apply at home. Apply moisturizing lotion as directed Secondary Dressing: Woven Gauze Sponge, Non-Sterile 4x4 in (Home Health) 2 x Per Week/30 Days Discharge Instructions: Apply over primary dressing as directed. Secondary Dressing: ABD Pad, 8x10 (Home Health) 2 x Per Week/30 Days Discharge Instructions: Apply over primary dressing as directed. Secondary Dressing: ADAPTIC TOUCH 3x4.25 in 2 x Per Week/30 Days Discharge Instructions: Apply over primary dressing as  directed. Com pression Wrap: ThreePress (3 layer compression wrap) 2 x Per Week/30 Days Discharge Instructions: Apply three layer compression as directed. WOUND #4: - Lower Leg Wound Laterality: Left, Posterior Cleanser: Soap and Water (Home Health) 2 x Per Week/30 Days Discharge Instructions: May shower and wash wound with dial antibacterial soap and water prior to dressing change. Cleanser: Wound Cleanser (Home Health) 2 x Per Week/30 Days Discharge Instructions: Cleanse the wound with wound cleanser prior to applying a clean dressing using gauze sponges, not tissue or cotton balls. Peri-Wound Care: Triamcinolone 15 (g) 2 x Per Week/30 Days Discharge Instructions: In clinic only. Apply to red, irritated periwound Peri-Wound Care: Sween Lotion (Moisturizing lotion) (Home Health) 2 x Per Week/30 Days Discharge Instructions: apply at home. Apply moisturizing lotion as directed Secondary Dressing: Woven Gauze Sponge, Non-Sterile 4x4 in (Home Health) 2 x Per Week/30 Days Discharge Instructions: Apply over primary dressing as directed. Secondary Dressing: Optifoam Non-Adhesive Dressing, 4x4 in 2 x Per Week/30 Days Discharge Instructions: Apply over primary dressing as directed. Com pression Wrap: ThreePress (3 layer compression wrap) 2 x Per Week/30 Days Discharge Instructions: Apply three layer compression as directed. 1. Apligraf #1 2. T this small wound posteriorly I think this is totally epithelialized we will apply some foam to protect it and the wrap o 3. She has home health who can exchange the external wrap and dressings next week but they are not to go below the Adaptic Electronic Signature(s) Signed: 10/11/2020 1:36:13 PM By: [name] MD Entered By: [name] on 10/10/2020 14:22:38 -------------------------------------------------------------------------------- SuperBill Details Patient Name: Date of Service: Jamie Brink, Christean Grief. 10/10/2020 Medical Record Number:  [id number] Patient Account Number: [id number] Date of Birth/Sex: Treating RN: Nov 06, 1940 (80 y.o. 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 Modifier: exposed Quantity: 44 CPT4 Code: [id number] Description: 15271 - SKIN SUB GRAFT TRNK/ARM/LEG ICD-10 Diagnosis Description L97.922 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 venous hypertension (idiopathic) with ulcer and inflammation of left low Modifier: exposed er extremity Quantity: 1 Physician Procedures : CPT4 Code Description Modifier [id number] 15271 - WC PHYS SKIN SUB GRAFT TRNK/ARM/LEG ICD-10 Diagnosis Description L97.922 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

## 2020-10-11 NOTE — Progress Notes (Signed)
AIANNA, [facility] ([id number]) Visit Report for 08/21/2020 SuperBill Details Patient Name: Date of Service: Jamie [name], Jamie [name] 08/21/2020 Medical Record Number: [id number] Patient Account Number: [id number] Date of Birth/Sex: Treating RN: [birth year] (80 y.o. 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

## 2020-10-11 NOTE — Progress Notes (Addendum)
Jamie [name], Jamie W. ([id number]) Visit Report for 10/10/2020 Arrival Information Details Patient Name: Date of Service: Jamie [name], [name] 10/10/2020 12:45 PM Medical Record Number: [id number] Patient Account Number: [id number] Date of Birth/Sex: Treating RN: 07/26/1940 (80 y.o. Charolotte EkeF) Priddy, Jennifer Primary Care Lyrick Lagrand: Delorise JacksonEggleston-Clark, [name] Other Clinician: Referring Latorsha Curling: Treating Chrystie Hagwood/Extender: Vivia Budgeobson, Michael Eggleston-Clark, [name] in Treatment: 14 Visit Information History Since Last Visit All ordered tests and consults were completed: No Patient Arrived: Ambulatory Added or deleted any medications: No Arrival Time: 13:10 Any new allergies or adverse reactions: No Accompanied By: niece Had a fall or experienced change in No Transfer Assistance: None activities of daily living that may affect Patient Identification Verified: Yes risk of falls: Secondary Verification Process Completed: Yes Signs or symptoms of abuse/neglect since last visito No Patient Requires Transmission-Based Precautions: No Hospitalized since last visit: No Patient Has Alerts: Yes Implantable device outside of the clinic excluding No Patient Alerts: L ABI: 1.06; R ABI: 1.01 cellular tissue based products placed in the center since last visit: Has Dressing in Place as Prescribed: Yes Has Compression in Place as Prescribed: Yes Pain Present Now: No Electronic Signature(s) Signed: 10/10/2020 6:17:43 PM By: Rolan LipaPriddy, Jennifer Entered By: Rolan LipaPriddy, Jennifer on 10/10/2020 13:26:40 -------------------------------------------------------------------------------- Compression Therapy Details Patient Name: Date of Service: Jamie KrillWILSO N, [place] [name] 10/10/2020 12:45 PM Medical Record Number: [id number] Patient Account Number: [id number] Date of Birth/Sex: Treating RN: 06/03/1940 (80 y.o. Roel CluckF) Barnhart, Jodi Primary Care Kian Ottaviano: Delorise JacksonEggleston-Clark, [name] Other Clinician: Referring  Megean Fabio: Treating Kamyra Schroeck/Extender: Vivia Budgeobson, Michael Eggleston-Clark, [name] in Treatment: 14 Compression Therapy Performed for Wound Assessment: Wound #3 Left,Distal,Medial Lower Leg Performed By: Clinician Antonieta IbaBarnhart, Jodi, RN Compression Type: Three Layer Post Procedure Diagnosis Same as Pre-procedure Electronic Signature(s) Signed: 10/10/2020 7:08:24 PM By: Antonieta IbaBarnhart, Jodi Entered By: Antonieta IbaBarnhart, Jodi on 10/10/2020 14:16:02 -------------------------------------------------------------------------------- Compression Therapy Details Patient Name: Date of Service: Jamie KrillWILSO N, [place] [name] 10/10/2020 12:45 PM Medical Record Number: [id number] Patient Account Number: [id number] Date of Birth/Sex: Treating RN: 04/27/1940 (80 y.o. Roel CluckF) Barnhart, Jodi Primary Care Briya Lookabaugh: Delorise JacksonEggleston-Clark, [name] Other Clinician: Referring Vander Kueker: Treating Thuy Atilano/Extender: Vivia Budgeobson, Michael Eggleston-Clark, [name] in Treatment: 14 Compression Therapy Performed for Wound Assessment: Wound #4 Left,Posterior Lower Leg Performed By: Clinician Antonieta IbaBarnhart, Jodi, RN Compression Type: Three Layer Post Procedure Diagnosis Same as Pre-procedure Electronic Signature(s) Signed: 10/10/2020 7:08:24 PM By: Antonieta IbaBarnhart, Jodi Entered By: Antonieta IbaBarnhart, Jodi on 10/10/2020 14:16:02 -------------------------------------------------------------------------------- Encounter Discharge Information Details Patient Name: Date of Service: Jamie [name], [place] [name] 10/10/2020 12:45 PM Medical Record Number: [id number] Patient Account Number: [id number] Date of Birth/Sex: Treating RN: 06/19/1940 (80 y.o. Charolotte EkeF) Priddy, Jennifer Primary Care Panagiotis Oelkers: Delorise JacksonEggleston-Clark, [name] Other Clinician: Referring Kiyoko Mcguirt: Treating Sherlynn Tourville/Extender: Vivia Budgeobson, Michael Eggleston-Clark, [name] in Treatment: 14 Encounter Discharge Information Items Post Procedure Vitals Discharge Condition: Stable Temperature (F): 98.7 Ambulatory  Status: Ambulatory Pulse (bpm): 80 Discharge Destination: Home Respiratory Rate (breaths/min): 18 Transportation: Private Auto Blood Pressure (mmHg): 129/79 Accompanied By: niece Schedule Follow-up Appointment: No Clinical Summary of Care: Patient Declined Electronic Signature(s) Signed: 10/10/2020 6:17:43 PM By: Rolan LipaPriddy, Jennifer Entered By: Rolan LipaPriddy, Jennifer on 10/10/2020 18:06:06 -------------------------------------------------------------------------------- Lower Extremity Assessment Details Patient Name: Date of Service: Jamie KrillWILSO N, [place] [name] 10/10/2020 12:45 PM Medical Record Number: [id number] Patient Account Number: [id number] Date of Birth/Sex: Treating RN: 11/07/1940 (80 y.o. Charolotte EkeF) Priddy, Jennifer Primary Care Mukesh Kornegay: Delorise JacksonEggleston-Clark, [name] Other Clinician: Referring Kaj Vasil: Treating Britini Garcilazo/Extender: Conard Novakobson, Michael Eggleston-Clark, [name] in Treatment: 14 Edema Assessment Assessed: [name][Left: No] [Right: No] Edema: [Left: Ye] [Right: s] Calf Left: Right: Point  of Measurement: 29 cm From Medial Instep 34.1 cm Ankle Left: Right: Point of Measurement: 11 cm From Medial Instep 24.6 cm Vascular Assessment Pulses: Dorsalis Pedis Palpable: [Left:Yes] Electronic Signature(s) Signed: 10/10/2020 6:17:43 PM By: Rolan Lipa Entered By: Rolan Lipa on 10/10/2020 13:28:25 -------------------------------------------------------------------------------- Multi Wound Chart Details Patient Name: Date of Service: Jamie [name], Jamie [name]. 10/10/2020 12:45 PM Medical Record Number: [id number] Patient Account Number: [id number] Date of Birth/Sex: Treating RN: 31-Mar-1941 (80 y.o. [name] Primary Care Arvis Miguez: [name] Other Clinician: Referring Aryah Doering: Treating Delon Revelo/Extender: Vivia Budge in Treatment: 14 Vital Signs Height(in): 63 Pulse(bpm): 80 Weight(lbs): 150 Blood Pressure(mmHg):  129/79 Body Mass Index(BMI): 27 Temperature(F): 98.7 Respiratory Rate(breaths/min): 18 Photos: [3:No Photos Left, Distal, Medial Lower Leg] [4:No Photos Left, Posterior Lower Leg] [[name]:[name] [name]] Wound Location: [3:Gradually Appeared] [4:Gradually Appeared] [[name]:[name]] Wounding Event: [3:Venous Leg Ulcer] [4:Venous Leg Ulcer] [[name]:[name]] Primary Etiology: [3:Sleep Apnea, Hypertension, Peripheral Sleep Apnea, Hypertension, Peripheral] [[name]:[name]] Comorbid History: [3:Venous Disease, Osteoarthritis, Neuropathy 04/09/2019] [4:Venous Disease, Osteoarthritis, Neuropathy 04/09/2019] [[name]:[name]] Date Acquired: [3:14] [4:14] [[name]:[name]] Weeks of Treatment: [3:Open] [4:Open] [[name]:[name]] Wound Status: [3:8.2x2.8x0.1] [4:0.1x0.1x0.1] [[name]:[name]] Measurements L x W x D (cm) [3:18.033] [4:0.008] [[name]:[name]] A (cm) : rea [3:1.803] [4:0.001] [[name]:[name]] Volume (cm) : [3:-154.60%] [4:99.00%] [[name]:[name]] % Reduction in Area: [3:-27.20%] [4:99.40%] [[name]:[name]] % Reduction in Volume: [3:Full Thickness Without Exposed] [4:Full Thickness Without Exposed] [[name]:[name]] Classification: [3:Support Structures Large] [4:Support Structures None Present] [[name]:[name]] Exudate Amount: [3:Serosanguineous] [4:[name]] [[name]:[name]] Exudate Type: [3:red, brown] [4:[name]] [[name]:[name]] Exudate Color: [3:Flat and Intact] [4:Distinct, outline attached] [[name]:[name]] Wound Margin: [3:Large (67-100%)] [4:None Present (0%)] [[name]:[name]] Granulation Amount: [3:Red, Pink] [4:[name]] [[name]:[name]] Granulation Quality: [3:Small (1-33%)] [4:None Present (0%)] [[name]:[name]] Necrotic Amount: [3:Fat Layer (Subcutaneous Tissue): Yes Fascia: No] [[name]:[name]] Exposed Structures: [3:Fascia: No Tendon: No Muscle: No Joint: No Bone: No Small (1-33%)] [4:Fat Layer (Subcutaneous Tissue): No Tendon: No Muscle: No Joint: No Bone: No Small (1-33%)] [[name]:[name]] Epithelialization: [3:Cellular or Tissue Based Product] [4:Compression Therapy] [[name]:[name]] Procedures Performed: [3:Compression Therapy] Treatment  Notes Electronic Signature(s) Signed: 10/10/2020 7:08:24 PM By: [name] Signed: 10/11/2020 1:36:13 PM By: [name] MD Entered By: [name] on 10/10/2020 14:19:39 -------------------------------------------------------------------------------- Multi-Disciplinary Care Plan Details Patient Name: Date of Service: Jamie [name], [place] W. 10/10/2020 12:45 PM Medical Record Number: [id number] Patient Account Number: [id number] Date of Birth/Sex: Treating RN: 05-13-1940 (80 y.o. 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 Compression as ordered Notes: Wound/Skin Impairment Nursing Diagnoses: Impaired tissue integrity Goals: Patient/caregiver will verbalize understanding of skin care regimen Date Initiated: 06/30/2020 Target Resolution Date: 10/13/2020 Goal Status: Active Ulcer/skin breakdown will have a volume reduction of 30% by week 4 Date Initiated: 06/30/2020 Date Inactivated: 08/07/2020 Target Resolution Date: 08/04/2020 Goal Status: Unmet Unmet Reason: non viable surface Ulcer/skin breakdown will have a volume reduction of 50% by week 8 Date Initiated: 10/10/2020 Target Resolution Date: 11/28/2020 Goal Status: Active Interventions: Assess patient/caregiver ability to obtain necessary supplies Assess patient/caregiver ability to perform ulcer/skin care regimen upon admission and as needed Assess ulceration(s) every visit Provide education on ulcer and skin care Treatment Activities: Skin care regimen initiated :  06/30/2020 Topical wound management initiated : 06/30/2020 Notes: Electronic Signature(s) Signed: 10/10/2020 7:08:24 PM By:  [name] Entered By: [name] on 10/10/2020 14:26:03 -------------------------------------------------------------------------------- Pain Assessment Details Patient Name: Date of Service: Jamie Krill 10/10/2020 12:45 PM Medical Record Number: [id number] Patient Account Number: [id number] Date of Birth/Sex: Treating RN: 12-21-1940 (80 y.o. Charolotte Eke Primary Care Bronco Mcgrory: [name] Other Clinician: Referring Miel Wisener: Treating Reham Slabaugh/Extender: Vivia Budge in Treatment: 14 Active Problems Location of Pain Severity and Description of Pain Patient Has Paino Yes Site Locations With Dressing Change: Yes Duration of the Pain. Constant / Intermittento Intermittent How Long Does it Lasto Hours: Minutes: 60 Rate the pain. Current Pain Level: 4 Worst Pain Level: 7 Least Pain Level: 2 Tolerable Pain Level: 5 Character of Pain Describe the Pain: Aching Pain Management and Medication Current Pain Management: Medication: Yes Cold Application: No Rest: Yes Massage: No Activity: No T.E.[name].S.: No Heat Application: No Leg drop or elevation: No Is the Current Pain Management Adequate: Inadequate How does your wound impact your activities of daily livingo Sleep: Yes Bathing: No Appetite: No Relationship With Others: No Bladder Continence: No Emotions: No Bowel Continence: No Work: No Toileting: No Drive: No Dressing: No Hobbies: No Electronic Signature(s) Signed: 10/10/2020 6:17:43 PM By: Rolan Lipa Entered By: Rolan Lipa on 10/10/2020 13:27:34 -------------------------------------------------------------------------------- Patient/Caregiver Education Details Patient Name: Date of Service: Jamie Krill 7/5/2022andnbsp12:45 PM Medical Record Number:  [id number] Patient Account Number: [id number] Date of Birth/Gender: Treating RN: [birth year] (80 y.o. [name] Primary Care Physician: [name] Other Clinician: Referring Physician: Treating Physician/Extender: Vivia Budge in Treatment: 14 Education Assessment Education Provided To: Patient Education Topics Provided Venous: Methods: Explain/Verbal, Printed Responses: State content correctly Wound/Skin Impairment: Methods: Explain/Verbal, Printed Responses: State content correctly Electronic Signature(s) Signed: 10/10/2020 7:08:24 PM By: [name] Entered By: [name] on 10/10/2020 14:20:15 -------------------------------------------------------------------------------- Wound Assessment Details Patient Name: Date of Service: Jamie Krill. 10/10/2020 12:45 PM Medical Record Number: [id number] Patient Account Number: [id number] Date of Birth/Sex: Treating RN: 1941-02-21 (80 y.o. Charolotte Eke Primary Care Deyjah Kindel: [name] Other Clinician: Referring Quincy Prisco: Treating Zacchaeus Halm/Extender: [name] Weeks in Treatment: 14 Wound Status Wound Number: 3 Primary Venous Leg Ulcer Etiology: Wound Location: Left, Distal, Medial Lower Leg Wound Open Wounding Event: Gradually Appeared Status: Date Acquired: 04/09/2019 Comorbid Sleep Apnea, Hypertension, Peripheral Venous Disease, Weeks Of Treatment: 14 History: Osteoarthritis, Neuropathy Clustered Wound: No Photos Wound Measurements Length: (cm) 8.2 Width: (cm) 2.8 Depth: (cm) 0.1 Area: (cm) 18.033 Volume: (cm) 1.803 % Reduction in Area: -154.6% % Reduction in Volume: -27.2% Epithelialization: Small (1-33%) Tunneling: No Undermining: No Wound Description Classification: Full Thickness Without Exposed Support Structures Wound Margin: Flat and Intact Exudate Amount: Large Exudate Type:  Serosanguineous Exudate Color: red, brown Foul Odor After Cleansing: No Slough/Fibrino Yes Wound Bed Granulation Amount: Large (67-100%) Exposed Structure Granulation Quality: Red, Pink Fascia Exposed: No Necrotic Amount: Small (1-33%) Fat Layer (Subcutaneous Tissue) Exposed: Yes Necrotic Quality: Adherent Slough Tendon Exposed: No Muscle Exposed: No Joint Exposed: No Bone Exposed: No Treatment Notes Wound #3 (Lower Leg) Wound Laterality: Left, Medial, Distal Cleanser Soap and Water Discharge Instruction: May shower and wash wound with dial antibacterial soap and water prior to dressing change. Wound Cleanser Discharge Instruction: Cleanse the wound with wound cleanser prior to applying a clean dressing using gauze sponges, not tissue or cotton balls. Peri-Wound Care Triamcinolone 15 (g) Discharge Instruction: In clinic only. Apply to red, irritated periwound Sween Lotion (Moisturizing lotion) Discharge Instruction: apply at home. Apply  moisturizing lotion as directed Topical Primary Dressing Secondary Dressing Woven Gauze Sponge, Non-Sterile 4x4 in Discharge Instruction: Apply over primary dressing as directed. ABD Pad, 8x10 Discharge Instruction: Apply over primary dressing as directed. ADAPTIC TOUCH 3x4.25 in Discharge Instruction: Apply over primary dressing as directed. Secured With Compression Wrap ThreePress (3 layer compression wrap) Discharge Instruction: Apply three layer compression as directed. Compression Stockings Add-Ons Electronic Signature(s) Signed: 10/11/2020 5:07:06 PM By: Rolan Lipa Signed: 10/11/2020 5:22:20 PM By: Karl Ito Previous Signature: 10/10/2020 6:17:43 PM Version By: Rolan Lipa Entered By: Karl Ito on 10/11/2020 16:23:35 -------------------------------------------------------------------------------- Wound Assessment Details Patient Name: Date of Service: Jamie [name], Jamie [name]. 10/10/2020 12:45 PM Medical Record  Number: [id number] Patient Account Number: [id number] Date of Birth/Sex: Treating RN: 05/05/1940 (80 y.o. Charolotte Eke Primary Care Mechele Kittleson: [name] Other Clinician: Referring Keirsten Matuska: Treating Shaune Westfall/Extender: [name] Weeks in Treatment: 14 Wound Status Wound Number: 4 Primary Venous Leg Ulcer Etiology: Wound Location: Left, Posterior Lower Leg Wound Open Wounding Event: Gradually Appeared Status: Date Acquired: 04/09/2019 Comorbid Sleep Apnea, Hypertension, Peripheral Venous Disease, Weeks Of Treatment: 14 History: Osteoarthritis, Neuropathy Clustered Wound: No Photos Wound Measurements Length: (cm) 0.1 Width: (cm) 0.1 Depth: (cm) 0.1 Area: (cm) 0.008 Volume: (cm) 0.001 % Reduction in Area: 99% % Reduction in Volume: 99.4% Epithelialization: Small (1-33%) Tunneling: No Undermining: No Wound Description Classification: Full Thickness Without Exposed Support Structures Wound Margin: Distinct, outline attached Exudate Amount: None Present Foul Odor After Cleansing: No Slough/Fibrino No Wound Bed Granulation Amount: None Present (0%) Exposed Structure Necrotic Amount: None Present (0%) Fascia Exposed: No Fat Layer (Subcutaneous Tissue) Exposed: No Tendon Exposed: No Muscle Exposed: No Joint Exposed: No Bone Exposed: No Treatment Notes Wound #4 (Lower Leg) Wound Laterality: Left, Posterior Cleanser Soap and Water Discharge Instruction: May shower and wash wound with dial antibacterial soap and water prior to dressing change. Wound Cleanser Discharge Instruction: Cleanse the wound with wound cleanser prior to applying a clean dressing using gauze sponges, not tissue or cotton balls. Peri-Wound Care Triamcinolone 15 (g) Discharge Instruction: In clinic only. Apply to red, irritated periwound Sween Lotion (Moisturizing lotion) Discharge Instruction: apply at home. Apply moisturizing lotion as  directed Topical Primary Dressing Secondary Dressing Woven Gauze Sponge, Non-Sterile 4x4 in Discharge Instruction: Apply over primary dressing as directed. Optifoam Non-Adhesive Dressing, 4x4 in Discharge Instruction: Apply over primary dressing as directed. Secured With Compression Wrap ThreePress (3 layer compression wrap) Discharge Instruction: Apply three layer compression as directed. Compression Stockings Add-Ons Electronic Signature(s) Signed: 10/11/2020 5:07:06 PM By: Rolan Lipa Signed: 10/11/2020 5:22:20 PM By: Karl Ito Previous Signature: 10/10/2020 6:17:43 PM Version By: Rolan Lipa Entered By: Karl Ito on 10/11/2020 16:23:02 -------------------------------------------------------------------------------- Vitals Details Patient Name: Date of Service: Jamie [name], [name] 10/10/2020 12:45 PM Medical Record Number: [id number] Patient Account Number: [id number] Date of Birth/Sex: Treating RN: [birth year] (80 y.o. Charolotte Eke Primary Care Mindi Akerson: [name] Other Clinician: Referring Xan Ingraham: Treating Buel Molder/Extender: Vivia Budge in Treatment: 14 Vital Signs Time Taken: 13:25 Temperature (F): 98.7 Height (in): 63 Pulse (bpm): 80 Weight (lbs): 150 Respiratory Rate (breaths/min): 18 Body Mass Index (BMI): 26.6 Blood Pressure (mmHg): 129/79 Reference Range: 80 - 120 mg / dl Airway Pulse Oximetry (%): 96 Electronic Signature(s) Signed: 10/10/2020 6:17:43 PM By: Rolan Lipa Entered By: Rolan Lipa on 10/10/2020 13:26:59

## 2020-10-24 ENCOUNTER — Encounter (HOSPITAL_BASED_OUTPATIENT_CLINIC_OR_DEPARTMENT_OTHER): Payer: Medicare Other | Admitting: Internal Medicine

## 2020-10-24 ENCOUNTER — Other Ambulatory Visit: Payer: Self-pay

## 2020-10-24 DIAGNOSIS — L97922 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: Secondary | ICD-10-CM | POA: Diagnosis not present

## 2020-10-24 NOTE — Progress Notes (Signed)
Jamie [name] ([id number]) Visit Report for 10/24/2020 Arrival Information Details Patient Name: Date of Service: Jamie [name], [name]. 10/24/2020 10:45 A M Medical Record Number: [id number] Patient Account Number: [id number] Date of Birth/Sex: Treating RN: Sep 03, 1940 (80 y.o. Jamie [name] Primary Care [name]: [name] Other Clinician: Referring Cache Bills: Treating Jamie [name] Visit Information History Since Last Visit Added or deleted any medications: No Patient Arrived: Ambulatory Any new allergies or adverse reactions: No Arrival Time: 11:08 Had a fall or experienced change in No Accompanied By: niece activities of daily living that may affect Transfer Assistance: None risk of falls: Patient Identification Verified: Yes Signs or symptoms of abuse/neglect since last visito No Secondary Verification Process Completed: Yes Hospitalized since last visit: No Patient Requires Transmission-Based Precautions: No Implantable device outside of the clinic excluding No Patient Has Alerts: Yes cellular tissue based products placed in the center Patient Alerts: L ABI: 1.06; R ABI: 1.01 since last visit: Has Dressing in Place as Prescribed: Yes Has Compression in Place as Prescribed: Yes Pain Present Now: No Electronic Signature(s) Signed: 10/24/2020 6:29:35 PM By: Jamie Abts RN, BSN Entered By: Jamie [name] on 10/24/2020 11:08:37 -------------------------------------------------------------------------------- Compression Therapy Details Patient Name: Date of Service: Jamie [name], Jamie [name] 10/24/2020 10:45 A M Medical Record Number: [id number] Patient Account Number: [id number] Date of Birth/Sex: Treating RN: 1940/11/22 (80 y.o. Jamie [name] Primary Care Lavaughn Haberle: [name] Other Clinician: Referring Larua Collier: Treating Dakota Stangl/Extender: Vivia Budge in Treatment: [name] Compression Therapy Performed for Wound Assessment: Wound #3 Left,Distal,Medial Lower Leg Performed By: Clinician Fonnie Mu, RN Compression Type: Three Layer Post Procedure Diagnosis Same as Pre-procedure Electronic Signature(s) Signed: 10/24/2020 5:32:12 PM By: Fonnie Mu RN Entered By: Fonnie Mu on 10/24/2020 11:57:12 -------------------------------------------------------------------------------- Encounter Discharge Information Details Patient Name: Date of Service: Jamie [name], Jamie [name] 10/24/2020 10:45 A M Medical Record Number: [id number] Patient Account Number: [id number] Date of Birth/Sex: Treating RN: 1940/07/18 (80 y.o. Jamie [name]: [name] Other Clinician: Referring Clif Serio: Treating Jamie [name] Encounter Discharge Information Items Post Procedure Vitals Discharge Condition: Stable Temperature (F): 98.4 Ambulatory Status: Wheelchair Pulse (bpm): 73 Discharge Destination: Home Respiratory Rate (breaths/min): [name] Transportation: Private Auto Blood Pressure (mmHg): 113/71 Schedule Follow-up Appointment: Yes Clinical Summary of Care: Provided on 10/24/2020 Form Type Recipient Paper Patient Patient Electronic Signature(s) Signed: 10/24/2020 12:05:[name] PM By: [name] Entered By: [name] on 10/24/2020 12:05:15 -------------------------------------------------------------------------------- Lower Extremity Assessment Details Patient Name: Date of Service: Jamie [name], Jamie [name] 10/24/2020 10:45 A M Medical Record Number: [id number] Patient Account Number: [id number] Date of Birth/Sex: Treating RN: 05-19-1940 (80 y.o. Jamie [name] Primary Care Lissie Hinesley: [name] Other Clinician: Referring Sayge Salvato: Treating Zakaiya Lares/Extender: Jamie [name] Edema Assessment Assessed: Jamie [name]: No] [Right: No] Edema: [Left: Ye] [Right: s] Calf Left: Right: Point of Measurement: 29 cm From Medial Instep 34 cm Ankle Left: Right: Point of Measurement: 11 cm From Medial Instep 23.5 cm Vascular Assessment Pulses: Dorsalis Pedis Palpable: [Left:Yes] Electronic Signature(s) Signed: 10/24/2020 6:29:35 PM By: Jamie Abts RN, BSN Entered By: Jamie [name] on 10/24/2020 11:[name]:23 -------------------------------------------------------------------------------- Multi Wound Chart Details Patient Name: Date of Service: Jamie [name], Jamie [name] 10/24/2020 10:45 A M Medical Record Number: [id number] Patient Account Number: [id number] Date of Birth/Sex: Treating RN: 30-Apr-1940 (80 y.o. Jamie [name] Primary Care Pansey Pinheiro: [name] Other Clinician: Referring Igor Bishop: Treating  Brandalyn Harting/Extender: Jamie [name] Vital Signs Height(in): 63 Pulse(bpm): 73 Weight(lbs): 150 Blood Pressure(mmHg): 113/71 Body Mass Index(BMI): 27 Temperature(F): 98.4 Respiratory Rate(breaths/min): [name] Photos: [3:No Photos Left, Distal, Medial Lower Leg] [4:No Photos Left, Posterior Lower Leg] [N/A:N/A N/A] Wound Location: [3:Gradually Appeared] [4:Gradually Appeared] [N/A:N/A] Wounding Event: [3:Venous Leg Ulcer] [4:Venous Leg Ulcer] [N/A:N/A] Primary Etiology: [3:Sleep Apnea, Hypertension, Peripheral Sleep Apnea, Hypertension, Peripheral] [N/A:N/A] Comorbid History: [3:Venous Disease, Osteoarthritis, Neuropathy 04/09/2019] [4:Venous Disease, Osteoarthritis, Neuropathy 04/09/2019] [N/A:N/A] Date Acquired: [3:[name]] [4:[name]] [N/A:N/A] Weeks of Treatment: [3:Open] [4:Healed - Epithelialized] [N/A:N/A] Wound Status: [3:8.5x3x0.1] [4:0x0x0] [N/A:N/A] Measurements L x W x D (cm) [3:20.028] [4:0] [N/A:N/A] A (cm) : rea [3:2.003] [4:0] [N/A:N/A] Volume  (cm) : [3:-182.70%] [4:100.00%] [N/A:N/A] % Reduction in Area: [3:-41.40%] [4:100.00%] [N/A:N/A] % Reduction in Volume: [3:Full Thickness Without Exposed] [4:Full Thickness Without Exposed] [N/A:N/A] Classification: [3:Support Structures Medium] [4:Support Structures None Present] [N/A:N/A] Exudate Amount: [3:Serosanguineous] [4:N/A] [N/A:N/A] Exudate Type: [3:red, brown] [4:N/A] [N/A:N/A] Exudate Color: [3:Flat and Intact] [4:Distinct, outline attached] [N/A:N/A] Wound Margin: [3:Large (67-100%)] [4:None Present (0%)] [N/A:N/A] Granulation Amount: [3:Red, Pink] [4:N/A] [N/A:N/A] Granulation Quality: [3:Small (1-33%)] [4:None Present (0%)] [N/A:N/A] Necrotic Amount: [3:Fat Layer (Subcutaneous Tissue): Yes Fascia: No] [N/A:N/A] Exposed Structures: [3:Fascia: No Tendon: No Muscle: No Joint: No Bone: No Small (1-33%)] [4:Fat Layer (Subcutaneous Tissue): No Tendon: No Muscle: No Joint: No Bone: No Large (67-100%)] [N/A:N/A] Treatment Notes Electronic Signature(s) Signed: 10/24/2020 5:24:30 PM By: [name] MD Signed: 10/24/2020 5:32:12 PM By: Fonnie Mu RN Entered By: [name] on 10/24/2020 11:49:36 -------------------------------------------------------------------------------- Multi-Disciplinary Care Plan Details Patient Name: Date of Service: Jamie Brink, MA [name] 10/24/2020 10:45 A M Medical Record Number: [id number] Patient Account Number: [id number] Date of Birth/Sex: Treating RN: 1940-07-02 (80 y.o. 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 Compression as ordered Notes: Wound/Skin Impairment Nursing Diagnoses: Impaired tissue integrity Goals: Patient/caregiver will verbalize understanding of skin care regimen Date Initiated: 06/30/2020 Target Resolution Date: 11/04/2020 Goal Status: Active Ulcer/skin breakdown will have a volume reduction of 30% by week 4 Date Initiated: 06/30/2020 Date Inactivated: 08/07/2020 Target Resolution Date: 08/04/2020 Goal Status: Unmet Unmet Reason: non viable surface Ulcer/skin breakdown will have a volume reduction of 50% by week 8 Date Initiated: 10/10/2020 Target Resolution Date: 12/09/2020 Goal Status: Active Interventions: Assess patient/caregiver ability to obtain necessary supplies Assess patient/caregiver ability to perform ulcer/skin care regimen upon admission and as needed Assess ulceration(s) every visit Provide education on ulcer and skin care Treatment Activities: Skin care regimen initiated : 06/30/2020 Topical wound management initiated : 06/30/2020 Notes: Electronic Signature(s) Signed: 10/24/2020 5:32:12 PM By: Fonnie Mu RN Entered By: Fonnie Mu on 10/24/2020 11:23:40 -------------------------------------------------------------------------------- Pain Assessment Details Patient Name: Date of Service: Jamie [name], Jamie [name] 10/24/2020 10:45 A M Medical Record Number: [id number] Patient Account Number: [id number] Date of Birth/Sex: Treating RN: 1941-03-30 (80 y.o. Jamie [name] Primary Care Zinia Innocent: [name] Other Clinician: Referring Simonne Boulos: Treating Monseratt Ledin/Extender: Vivia Budge in Treatment: [name] Active Problems Location of Pain Severity and Description of Pain Patient Has Paino No Patient Has Paino No Site Locations Pain Management and Medication Current Pain Management: Electronic Signature(s) Signed: 10/24/2020 6:29:35 PM  By: Burnadette Peter  Emeterio Reeve RN, BSN Entered By: Jamie [name] on 10/24/2020 11:08:55 -------------------------------------------------------------------------------- Patient/Caregiver Education Details Patient Name: Date of Service: Jamie [name], [place] 7/19/2022andnbsp10:45 A M Medical Record Number: [id number] Patient Account Number: [id number] Date of Birth/Gender: Treating RN: 1941/02/24 (80 y.o. Jamie [name] Primary Care Physician: [name] Other Clinician: Referring Physician: Treating Physician/Extender: Vivia Budge in Treatment: [name] Education Assessment Education Provided To: Patient Education Topics Provided Wound/Skin Impairment: Methods: Explain/Verbal Responses: State content correctly [employer]) Signed: 10/24/2020 5:32:12 PM By: Fonnie Mu RN Entered By: Fonnie Mu on 10/24/2020 11:23:53 -------------------------------------------------------------------------------- Wound Assessment Details Patient Name: Date of Service: Jamie [name], Jamie [name] 10/24/2020 10:45 A M Medical Record Number: [id number] Patient Account Number: [id number] Date of Birth/Sex: Treating RN: 1940-04-30 (80 y.o. Jamie [name] Primary Care Elektra Wartman: [name] Other Clinician: Referring Maguire Killmer: Treating Ibrahima Holberg/Extender: Jamie [name] Wound Status Wound Number: 3 Primary Venous Leg Ulcer Etiology: Wound Location: Left, Distal, Medial Lower Leg Wound Open Wounding Event: Gradually Appeared Status: Date Acquired: 04/09/2019 Comorbid Sleep Apnea, Hypertension, Peripheral Venous Disease, Weeks Of Treatment: [name] History: Osteoarthritis, Neuropathy Clustered Wound: No Photos Wound Measurements Length: (cm) 8.5 Width: (cm) 3 Depth: (cm) 0.1 Area: (cm) 20.028 Volume: (cm) 2.003 % Reduction in Area: -182.7% % Reduction in Volume:  -41.4% Epithelialization: Small (1-33%) Tunneling: No Undermining: No Wound Description Classification: Full Thickness Without Exposed Support Structures Wound Margin: Flat and Intact Exudate Amount: Medium Exudate Type: Serosanguineous Exudate Color: red, brown Foul Odor After Cleansing: No Slough/Fibrino Yes Wound Bed Granulation Amount: Large (67-100%) Exposed Structure Granulation Quality: Red, Pink Fascia Exposed: No Necrotic Amount: Small (1-33%) Fat Layer (Subcutaneous Tissue) Exposed: Yes Necrotic Quality: Adherent Slough Tendon Exposed: No Muscle Exposed: No Joint Exposed: No Bone Exposed: No Treatment Notes Wound #3 (Lower Leg) Wound Laterality: Left, Medial, Distal Cleanser Soap and Water Discharge Instruction: May shower and wash wound with dial antibacterial soap and water prior to dressing change. Wound Cleanser Discharge Instruction: Cleanse the wound with wound cleanser prior to applying a clean dressing using gauze sponges, not tissue or cotton balls. Peri-Wound Care Triamcinolone 15 (g) Discharge Instruction: In clinic only. Apply to red, irritated periwound Sween Lotion (Moisturizing lotion) Discharge Instruction: apply at home. Apply moisturizing lotion as directed Topical Primary Dressing Secondary Dressing Woven Gauze Sponge, Non-Sterile 4x4 in Discharge Instruction: Apply over primary dressing as directed. ABD Pad, 8x10 Discharge Instruction: Apply over primary dressing as directed. ADAPTIC TOUCH 3x4.25 in Discharge Instruction: Apply over primary dressing as directed. Secured With Compression Wrap ThreePress (3 layer compression wrap) Discharge Instruction: Apply three layer compression as directed. Compression Stockings Add-Ons Electronic Signature(s) Signed: 10/24/2020 5:17:35 PM By: Karl Ito Signed: 10/24/2020 6:29:35 PM By: Jamie Abts RN, BSN Entered By: Karl Ito on 10/24/2020  17:09:42 -------------------------------------------------------------------------------- Wound Assessment Details Patient Name: Date of Service: Jamie [name], Jamie [name] 10/24/2020 10:45 A M Medical Record Number: [id number] Patient Account Number: [id number] Date of Birth/Sex: Treating RN: 1940/04/12 (80 y.o. Jamie [name] Primary Care Dusty Raczkowski: [name] Other Clinician: Referring Teliyah Royal: Treating Yitta Gongaware/Extender: Jamie [name] Wound Status Wound Number: 4 Primary Venous Leg Ulcer Etiology: Wound Location: Left, Posterior Lower Leg Wound Healed - Epithelialized Wounding Event: Gradually Appeared Status: Date Acquired: 04/09/2019 Comorbid Sleep Apnea, Hypertension, Peripheral Venous Disease, Weeks Of Treatment: [name] History: Osteoarthritis, Neuropathy Clustered Wound: No Wound Measurements Length: (cm) Width: (cm) Depth: (cm) Area: (cm) Volume: (cm) 0 % Reduction in Area: 100% 0 %  Reduction in Volume: 100% 0 Epithelialization: Large (67-100%) 0 Tunneling: No 0 Undermining: No Wound Description Classification: Full Thickness Without Exposed Support Structures Wound Margin: Distinct, outline attached Exudate Amount: None Present Foul Odor After Cleansing: No Slough/Fibrino No Wound Bed Granulation Amount: None Present (0%) Exposed Structure Necrotic Amount: None Present (0%) Fascia Exposed: No Fat Layer (Subcutaneous Tissue) Exposed: No Tendon Exposed: No Muscle Exposed: No Joint Exposed: No Bone Exposed: No Electronic Signature(s) Signed: 10/24/2020 6:29:35 PM By: Jamie AbtsLynch, Shatara RN, BSN Entered By: Jamie AbtsLynch, Shatara on 10/24/2020 11:17:22 -------------------------------------------------------------------------------- Vitals Details Patient Name: Date of Service: Jamie BrinkWILSO N, MA [name] 10/24/2020 10:45 A M Medical Record Number: [id number] Patient Account Number: [id number] Date of  Birth/Sex: Treating RN: 01/02/1941 (80 y.o. Jamie LinkF) Lynch, Shatara Primary Care Taffany Heiser: Delorise JacksonEggleston-Clark, [name] Other Clinician: Referring Soraida Vickers: Treating Shaneice Barsanti/Extender: Vivia Budgeobson, Michael Eggleston-Clark, [name] in Treatment: [name] Vital Signs Time Taken: 11:08 Temperature (F): 98.4 Height (in): 63 Pulse (bpm): 73 Weight (lbs): 150 Respiratory Rate (breaths/min): [name] Body Mass Index (BMI): 26.6 Blood Pressure (mmHg): 113/71 Reference Range: 80 - 120 mg / dl Electronic Signature(s) Signed: 10/24/2020 6:29:35 PM By: Jamie AbtsLynch, Shatara RN, BSN Entered By: Jamie AbtsLynch, Shatara on 10/24/2020 11:08:51

## 2020-10-24 NOTE — Progress Notes (Addendum)
Jamie, [name] ([id number]) Visit Report for 10/24/2020 Cellular or Tissue Based Product Details Patient Name: Date of Service: Jamie [name], [name]. 10/24/2020 10:45 A M Medical Record Number: [id number] Patient Account Number: [id number] Date of Birth/Sex: Treating RN: 10-16-1940 (80 y.o. Jamie [name], Jamie [name] Primary Care Provider: Delorise [name] Other Clinician: Referring Provider: Treating Provider/Extender: Jamie [name] in Treatment: 16 Cellular or Tissue Based Product Type Wound #3 Left,Distal,Medial Lower Leg Applied to: Performed By: Physician Jamie [name]., MD Cellular or Tissue Based Product Type: Apligraf Level of Consciousness (Pre-procedure): Awake and Alert Pre-procedure Verification/Time Out Yes - 11:45 Taken: Location: trunk / arms / legs Wound Size (sq cm): 25.5 Product Size (sq cm): 44 Waste Size (sq cm): 0 Amount of Product Applied (sq cm): 44 Instrument Used: Blade, Forceps Lot #: [id number] Expiration Date: 10/27/2020 Fenestrated: Yes Instrument: Blade Reconstituted: Yes Solution Type: NS Solution Amount: 5mL Lot #: [id number] Solution Expiration Date: 12/07/2021 Secured: Yes Secured With: Steri-Strips Dressing Applied: Yes Primary Dressing: adaptic Procedural Pain: 0 Post Procedural Pain: 0 Response to Treatment: Procedure was tolerated well Level of Consciousness (Post- Awake and Alert procedure): Post Procedure Diagnosis Same as Pre-procedure Electronic Signature(s) Signed: 10/24/2020 5:24:30 PM By: [name] MD Entered By: [name] on 10/24/2020 11:59:14 -------------------------------------------------------------------------------- HPI Details Patient Name: Date of Service: Jamie Brink, [place] Jamie W. 10/24/2020 10:45 A M Medical Record Number: [id number] Patient Account Number: [id number] Date of Birth/Sex: Treating RN: 09-02-1940 (80 y.o. Jamie [name] Primary Care  Provider: Delorise [name] Other Clinician: Referring Provider: Treating Provider/Extender: Jamie [name] in Treatment: 16 History of Present Illness HPI Description: ADMISSION 06/30/2020 [name] is an 80 year old woman who lives in [place]. She is here with her niece for review of wounds on the left medial lower leg and ankle. These have apparently been present for over a year and she followed with Dr. Olegario [name] at the [facility] in [place] for quite a period of time although it looks as though there was a initial consult wound from Dr. Marcha Solders on February 18 presumably there was therefore hiatus. At that point the wounds were described as being there for 3 months. She also tells me she was at the wound care center in [place] for a period of time with this. 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 She had venous studies that were negative for DVT ABIs on the right were 1.01 on the left 1.06. She has . had previous applications of puraply, compression which she does not tolerate very well. She has had several rounds of oral antibiotic therapy. She complains of unrelenting pain and she is seeing Dr. Reece Agar [name] of pain management apparently was on oxycodone but that did not help. She also had a skin biopsy done by Dr. Olegario [name] although we do not have that result. She does not appear to have an arterial issue. I am not completely clear what she has been putting on the wounds lately. 3/31; this patient has a particularly nasty set of wounds on the left medial ankle in the middle of what looks to be hemosiderin deposition secondary to chronic venous insufficiency. She has a lot of pain followed by pain management. Dr. Marcha Solders apparently did a biopsy of something on the left leg last fall what I would like to get this result. She has a history of MRSA treatment. I do not believe she had reflux studies but  she did have DVT  rule out studies. Previous ABIs have not suggested arterial insufficiency 4/7; difficult wounds on the left medial ankle probably chronic venous insufficiency. With considerable effort on behalf of our case manager we were able to finally to speak to somebody at the hospital in [place] who indicated that no biopsy of this area have been done even though the patient describes this in some detail and is even able to point out where she thinks the biopsy was done. We have been using Sorbact. The PCR culture I did showed polymicrobial identification with Pseudomonas, staph aureus, Peptostreptococcus. All of this and low titers. Resistance genes identified were MRSA, staph virulence gene and tetracycline. We are going to send this to [facility] for a topical antibiotic which is something that we have had good success with recently in large venous ulcers with a lot of purulent drainage. There would not be an easy oral alternative here possibly line escalated and ciprofloxacin if we need to use systemic antibiotic 4/15; difficult area on the left medial ankle. Most likely chronic venous insufficiency. I think she will probably need venous reflux study I think she had DVT rule outs but not venous reflux studies. We have not yet obtained the topical antibiotics. She has home health changing the dressing we have been using Sorbact for adherent fibrinous debris on the surface. Very difficult to remove 4/22; patient presents for 1 week follow-up. She has been using sore back under compression wraps and these are changed 3 times a week with home health. She also had Keystone antibiotics sent to her house and brought them in today. She has no complaints or issues today. 5/2; patient is here for follow-up. She has been using Sorbact under compression. Very painful wound. She has been using Keystone antibiotics. Not much improvement although the more medial part of the wound has cleaned up nicely and the  larger part of the wound about 50% slough covered. Part of the issue here is that she had stays at both [facility] wound care center, [facility] wound care center and now [place]. Not sure if she has had venous reflux studies. As far as we are able to tell she did not have a biopsy. My notes state that she did not have venous reflux studies just DVT rule outs. 5/16; patient goes for venous reflux studies this afternoon. She says that wound was biopsied which sounds like punch biopsies by Dr. Lynden Ang we do not have these results. We are using Sorbact. Very difficult wound to debride 5/23; patient presents for 1 week follow-up. She reports tolerating the wraps well with sorbact underneath. She had ABIs and venous reflux studies done. She has no issues or complaints today. She denies acute signs of infection. 6/6; I have reviewed the patient's vascular studies. Wounds are on the left medial and posterior calf. She had significant reflux in the greater saphenous vein in the in the mid thigh, distal thigh knee and the small saphenous vein in the popliteal fossa. The vein diameters do not look too impressive though. She is going to see the vascular surgeon on Wednesday. She also had venous reflux in the right common femoral vein. She did not have any evidence of a DVT or SVT I am wondering whether there is an ablation procedure that would benefit her in the greater saphenous vein on the left. . She tells [place] that home health put the dressing on too tight and she took off 1 layer. The swelling in her left leg is a little worse  as a result of this. Not much change in the wound measurements so the surface of the wound looks better Her arterial studies showed an ABI on the right of 1.14 with a triphasic waveform and a great toe pressure of 0.89. On the left her ABIs were noncompressible at 1.34 but with triphasic waveforms and a TBI of 0.97. Her greater toe pressure was 122. There was no evidence of significant bilateral  arterial disease 6/20 patient went to see Dr. Durwin Nora. He did not think she had significant arterial disease. In terms of her venous duplex on the right side there was no evidence of a DVT or SVT there was deep venous reflux involving the common femoral vein no superficial vein reflux on the left side there was no DVT or SVT there was no deep vein graft reflux there was some reflux in the greater saphenous vein from the mid thigh to the knee but the vein here was not dilated. He thought these were venous wounds he prescribed a compression pump but I am not sure who we ordered it from The patient has been approved for Apligraf. Still using silver collagen this week 7/5; Apligraf #1 7/19 Apligraf #2. Decent improvement in the condition of the wound bed. Epithelialization distal Electronic Signature(s) Signed: 10/24/2020 5:24:30 PM By: [name] MD Entered By: [name] on 10/24/2020 11:50:22 -------------------------------------------------------------------------------- Physical Exam Details Patient Name: Date of Service: Jamie [name], Jamie [name] 10/24/2020 10:45 A M Medical Record Number: [id number] Patient Account Number: [id number] Date of Birth/Sex: Treating RN: 1940-07-18 (80 y.o. Jamie [name] Primary Care Provider: Delorise [name] Other Clinician: Referring Provider: Treating Provider/Extender: Jamie [name] in Treatment: 16 Constitutional Sitting or standing Blood Pressure is within target range for patient.. Pulse regular and within target range for patient.Marland Kitchen Respirations regular, non-labored and within target range.. Temperature is normal and within the target range for the patient.Marland Kitchen Appears in no distress. Notes Wound exam; left anterior lower leg and the medial part of the left calf. We are making nice changes posteriorly. Wound surfaces look better. Apligraf #2 Electronic Signature(s) Signed: 10/24/2020 5:24:30 PM By:  [name] MD Entered By: [name] on 10/24/2020 11:51:14 -------------------------------------------------------------------------------- Physician Orders Details Patient Name: Date of Service: Jamie [name], Jamie [name] 10/24/2020 10:45 A M Medical Record Number: [id number] Patient Account Number: [id number] Date of Birth/Sex: Treating RN: [birth year] (80 y.o. Jamie [name] Primary Care Provider: Delorise [name] Other Clinician: Referring Provider: Treating Provider/Extender: Jamie [name] in Treatment: 16 Verbal / Phone Orders: No Diagnosis Coding Follow-up Appointments ppointment in 2 weeks. - with Dr. Leanord Hawking Return A Cellular or Tissue Based Products Cellular or Tissue Based Product Type: - Apligraf #2 10/24/20 Bathing/ Shower/ Hygiene May shower with protection but do not get wound dressing(s) wet. Edema Control - Lymphedema / SCD / Other Elevate legs to the level of the heart or above for 30 minutes daily and/or when sitting, a frequency of: - throughout the day 3-4 times a day. Avoid standing for long periods of time. Exercise regularly Additional Orders / Instructions Follow Nutritious Diet Home Health New wound care orders this week; continue Home Health for wound care. May utilize formulary equivalent dressing for wound treatment orders unless otherwise specified. - Change wrap and outer dressing, Do NOT remove skin substitute. Other Home Health Orders/Instructions: - [facility] fax #[phone] Wound Treatment Wound #3 - Lower Leg Wound Laterality: Left, Medial, Distal Cleanser: Soap and Water (Home Health) 2 x Per  Week/30 Days Discharge Instructions: May shower and wash wound with dial antibacterial soap and water prior to dressing change. Cleanser: Wound Cleanser (Home Health) 2 x Per Week/30 Days Discharge Instructions: Cleanse the wound with wound cleanser prior to applying a clean  dressing using gauze sponges, not tissue or cotton balls. Peri-Wound Care: Triamcinolone 15 (g) 2 x Per Week/30 Days Discharge Instructions: In clinic only. Apply to red, irritated periwound Peri-Wound Care: Sween Lotion (Moisturizing lotion) (Home Health) 2 x Per Week/30 Days Discharge Instructions: apply at home. Apply moisturizing lotion as directed Secondary Dressing: Woven Gauze Sponge, Non-Sterile 4x4 in (Home Health) 2 x Per Week/30 Days Discharge Instructions: Apply over primary dressing as directed. Secondary Dressing: ABD Pad, 8x10 (Home Health) 2 x Per Week/30 Days Discharge Instructions: Apply over primary dressing as directed. Secondary Dressing: ADAPTIC TOUCH 3x4.25 in 2 x Per Week/30 Days Discharge Instructions: Apply over primary dressing as directed. Compression Wrap: ThreePress (3 layer compression wrap) 2 x Per Week/30 Days Discharge Instructions: Apply three layer compression as directed. Electronic Signature(s) Signed: 10/24/2020 5:24:30 PM By: Baltazar Najjarobson, Zelda Reames MD Signed: 10/24/2020 5:32:12 PM By: Fonnie MuBreedlove, Lauren RN Entered By: Fonnie MuBreedlove, Jamie [name] on 10/24/2020 11:48:40 -------------------------------------------------------------------------------- Problem List Details Patient Name: Date of Service: Jamie BrinkWILSO [name], Jamie GaveMA Jamie W. 10/24/2020 10:45 A M Medical Record Number: [id number] Patient Account Number: [id number] Date of Birth/Sex: Treating RN: 03/31/1941 (80 y.o. 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 10/24/2020 10:45 A M Medical Record Number: [id number] Patient Account Number: [id number] Date of Birth/Sex: Treating RN: 09/28/1940 (80 y.o. Jamie FailF) Breedlove, Jamie [name] Primary Care Provider: Delorise JacksonEggleston-Clark, [name] Other Clinician: Referring Provider: Treating Provider/Extender: Conard Novakobson, Sayana Salley Eggleston-Clark, [name] in Treatment: 16 Subjective History of Present Illness (HPI) ADMISSION 06/30/2020 [name] is an 80 year old woman who lives in [place]. She is here with her niece for review of wounds on the left medial lower leg and ankle. These have apparently been present for over a year and she followed with Dr. Olegario MessierKathy at the [facility] in [place] for quite a period of time although it looks as though there was a initial consult wound from Dr. Marcha Soldersathey on February 18 presumably there was therefore hiatus. At that point the wounds were described as being there for 3 months. She also tells me she was at the wound care center in [place] for a period of time with this. 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 She had venous studies that were negative for DVT ABIs on the right were 1.01 on the left 1.06. She has . had previous applications of puraply, compression which she does not tolerate very well. She has had several rounds of oral antibiotic therapy. She complains of unrelenting pain and she is seeing Dr. Reece AgarEspy [name] of pain management apparently was on oxycodone but that did not help. She also had a skin biopsy done by Dr. Olegario MessierKathy although we do not have that result. She does  not appear to have an  arterial issue. I am not completely clear what she has been putting on the wounds lately. 3/31; this patient has a particularly nasty set of wounds on the left medial ankle in the middle of what looks to be hemosiderin deposition secondary to chronic venous insufficiency. She has a lot of pain followed by pain management. Dr. Marcha Solders apparently did a biopsy of something on the left leg last fall what I would like to get this result. She has a history of MRSA treatment. I do not believe she had reflux studies but she did have DVT rule out studies. Previous ABIs have not suggested arterial insufficiency 4/7; difficult wounds on the left medial ankle probably chronic venous insufficiency. With considerable effort on behalf of our case manager we were able to finally to speak to somebody at the hospital in [place] who indicated that no biopsy of this area have been done even though the patient describes this in some detail and is even able to point out where she thinks the biopsy was done. We have been using Sorbact. The PCR culture I did showed polymicrobial identification with Pseudomonas, staph aureus, Peptostreptococcus. All of this and low titers. Resistance genes identified were MRSA, staph virulence gene and tetracycline. We are going to send this to [facility] for a topical antibiotic which is something that we have had good success with recently in large venous ulcers with a lot of purulent drainage. There would not be an easy oral alternative here possibly line escalated and ciprofloxacin if we need to use systemic antibiotic 4/15; difficult area on the left medial ankle. Most likely chronic venous insufficiency. I think she will probably need venous reflux study I think she had DVT rule outs but not venous reflux studies. We have not yet obtained the topical antibiotics. She has home health changing the dressing we have been using Sorbact for adherent fibrinous debris on the surface. Very difficult  to remove 4/22; patient presents for 1 week follow-up. She has been using sore back under compression wraps and these are changed 3 times a week with home health. She also had [facility] antibiotics sent to her house and brought them in today. She has no complaints or issues today. 5/2; patient is here for follow-up. She has been using Sorbact under compression. Very painful wound. She has been using Keystone antibiotics. Not much improvement although the more medial part of the wound has cleaned up nicely and the larger part of the wound about 50% slough covered. Part of the issue here is that she had stays at both [facility] wound care center, [facility] wound care center and now [place]. Not sure if she has had venous reflux studies. As far as we are able to tell she did not have a biopsy. My notes state that she did not have venous reflux studies just DVT rule outs. 5/16; patient goes for venous reflux studies this afternoon. She says that wound was biopsied which sounds like punch biopsies by Dr. Lynden Ang we do not have these results. We are using Sorbact. Very difficult wound to debride 5/23; patient presents for 1 week follow-up. She reports tolerating the wraps well with sorbact underneath. She had ABIs and venous reflux studies done. She has no issues or complaints today. She denies acute signs of infection. 6/6; I have reviewed the patient's vascular studies. Wounds are on the left medial and posterior calf. She had significant reflux in the greater saphenous vein in the in the mid  thigh, distal thigh knee and the small saphenous vein in the popliteal fossa. The vein diameters do not look too impressive though. She is going to see the vascular surgeon on Wednesday. She also had venous reflux in the right common femoral vein. She did not have any evidence of a DVT or SVT I am wondering whether there is an ablation procedure that would benefit her in the greater saphenous vein on the left. . She tells [place]  that home health put the dressing on too tight and she took off 1 layer. The swelling in her left leg is a little worse as a result of this. Not much change in the wound measurements so the surface of the wound looks better Her arterial studies showed an ABI on the right of 1.14 with a triphasic waveform and a great toe pressure of 0.89. On the left her ABIs were noncompressible at 1.34 but with triphasic waveforms and a TBI of 0.97. Her greater toe pressure was 122. There was no evidence of significant bilateral arterial disease 6/20 patient went to see Dr. Durwin Nora. He did not think she had significant arterial disease. In terms of her venous duplex on the right side there was no evidence of a DVT or SVT there was deep venous reflux involving the common femoral vein no superficial vein reflux on the left side there was no DVT or SVT there was no deep vein graft reflux there was some reflux in the greater saphenous vein from the mid thigh to the knee but the vein here was not dilated. He thought these were venous wounds he prescribed a compression pump but I am not sure who we ordered it from The patient has been approved for Apligraf. Still using silver collagen this week 7/5; Apligraf #1 7/19 Apligraf #2. Decent improvement in the condition of the wound bed. Epithelialization distal Objective Constitutional Sitting or standing Blood Pressure is within target range for patient.. Pulse regular and within target range for patient.Marland Kitchen Respirations regular, non-labored and within target range.. Temperature is normal and within the target range for the patient.Marland Kitchen Appears in no distress. Vitals Time Taken: 11:08 AM, Height: 63 in, Weight: 150 lbs, BMI: 26.6, Temperature: 98.4 F, Pulse: 73 bpm, Respiratory Rate: 16 breaths/min, Blood Pressure: 113/71 mmHg. General Notes: Wound exam; left anterior lower leg and the medial part of the left calf. We are making nice changes posteriorly. Wound surfaces look  better. Apligraf #2 Integumentary (Hair, Skin) Wound #3 status is Open. Original cause of wound was Gradually Appeared. The date acquired was: 04/09/2019. The wound has been in treatment 16 weeks. The wound is located on the Left,Distal,Medial Lower Leg. The wound measures 8.5cm length x 3cm width x 0.1cm depth; 20.028cm^2 area and 2.003cm^3 volume. There is Fat Layer (Subcutaneous Tissue) exposed. There is no tunneling or undermining noted. There is a medium amount of serosanguineous drainage noted. The wound margin is flat and intact. There is large (67-100%) red, pink granulation within the wound bed. There is a small (1-33%) amount of necrotic tissue within the wound bed including Adherent Slough. Wound #4 status is Healed - Epithelialized. Original cause of wound was Gradually Appeared. The date acquired was: 04/09/2019. The wound has been in treatment 16 weeks. The wound is located on the Left,Posterior Lower Leg. The wound measures 0cm length x 0cm width x 0cm depth; 0cm^2 area and 0cm^3 volume. There is no tunneling or undermining noted. There is a none present amount of drainage noted. The wound margin  is distinct with the outline attached to the wound base. There is no granulation within the wound bed. There is no necrotic tissue within the wound bed. 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 A skin graft procedure using a bioengineered skin substitute/cellular or tissue based product was performed by Jamie [name]., MD with the following instrument(s): Blade and Forceps. Apligraf was applied and secured with Steri-Strips. 44 sq cm of product was utilized and 0 sq cm was wasted. Post  Application, adaptic was applied. A Time Out was conducted at 11:45, prior to the start of the procedure. The procedure was tolerated well with a pain level of 0 throughout and a pain level of 0 following the procedure. Post procedure Diagnosis Wound #3: Same as Pre-Procedure . 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 There was a Three Layer Compression Therapy Procedure by Fonnie Mu, RN. Post procedure Diagnosis Wound #3: Same as Pre-Procedure Plan Follow-up Appointments: Return Appointment in 2 weeks. - with Dr. Leanord Hawking Cellular or Tissue Based Products: Cellular or Tissue Based Product Type: - Apligraf #2 10/24/20 Bathing/ Shower/ Hygiene: May shower with protection but do not get wound dressing(s) wet. Edema Control - Lymphedema / SCD / Other: Elevate legs to the level of the heart or above for 30 minutes daily and/or when sitting, a frequency of: - throughout the day 3-4 times a day. Avoid standing for long periods of time. Exercise regularly Additional Orders / Instructions: Follow Nutritious Diet Home Health: New wound care orders this week; continue Home Health for wound care. May utilize formulary equivalent dressing for wound treatment orders unless otherwise specified. - Change wrap and outer dressing, Do NOT remove skin substitute. Other Home Health Orders/Instructions: - [facility] fax #[phone] WOUND #3: - Lower Leg Wound Laterality: Left, Medial, Distal Cleanser: Soap and Water (Home Health) 2 x Per Week/30 Days Discharge Instructions: May shower and wash wound with dial antibacterial soap and water prior to dressing change. Cleanser: Wound Cleanser (Home Health) 2 x Per Week/30 Days Discharge Instructions: Cleanse the wound with wound cleanser prior to applying a clean dressing using gauze sponges, not tissue or cotton balls. Peri-Wound Care: Triamcinolone 15 (g) 2 x Per Week/30  Days Discharge Instructions: In clinic only. Apply to red, irritated periwound Peri-Wound Care: Sween Lotion (Moisturizing lotion) (Home Health) 2 x Per Week/30 Days Discharge Instructions: apply at home. Apply moisturizing lotion as directed Secondary Dressing: Woven Gauze Sponge, Non-Sterile 4x4 in (Home Health) 2 x Per Week/30 Days Discharge Instructions: Apply over primary dressing as directed. Secondary Dressing: ABD Pad, 8x10 (Home Health) 2 x Per Week/30 Days Discharge Instructions: Apply over primary dressing as directed. Secondary Dressing: ADAPTIC TOUCH 3x4.25 in 2 x Per Week/30 Days Discharge Instructions: Apply over primary dressing as directed. Com pression Wrap: ThreePress (3 layer compression wrap) 2 x Per Week/30 Days Discharge Instructions: Apply three layer compression as directed. 1. Apligraf #2 in the standard fashion. Nice improvement for first application and the condition of the wound bed Electronic Signature(s) Signed: 11/21/2020 1:31:59 PM By: [name] MD Signed: 01/04/2021 4:51:27 PM By: Zandra Abts RN, BSN Previous Signature: 10/24/2020 5:24:30 PM Version By: [name] MD Entered By: Zandra Abts on  11/14/2020 08:17:19 -------------------------------------------------------------------------------- SuperBill Details Patient Name: Date of Service: Jamie [name], [name]. 10/24/2020 Medical Record Number: [id number] Patient Account Number: [id number] Date of Birth/Sex: Treating RN: May 23, 1940 (80 y.o. 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 Facility Procedures CPT4 Code: [id number] Description: (Facility Use Only) Apligraf 1 SQ CM Modifier: Quantity: 44 CPT4 Code: [id number] Description: 15271 - SKIN SUB GRAFT TRNK/ARM/LEG ICD-10 Diagnosis Description L97.922 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 venous hypertension (idiopathic) with ulcer and inflammation of left lo Modifier: exposed wer extremity Quantity: 1 CPT4 Code: [id number] Description: 15272 - SKIN SUB GRAFT T/A/L ADD-ON ICD-10 Diagnosis Description L97.922 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 venous hypertension (idiopathic) with ulcer and inflammation of left lo Modifier: exposed wer extremity Quantity: 1 Physician Procedures : CPT4 Code Description Modifier [id number] 15271 - WC PHYS SKIN SUB GRAFT TRNK/ARM/LEG ICD-10 Diagnosis Description L97.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

## 2020-11-07 ENCOUNTER — Encounter (HOSPITAL_BASED_OUTPATIENT_CLINIC_OR_DEPARTMENT_OTHER): Payer: Medicare Other | Attending: Internal Medicine | Admitting: Internal Medicine

## 2020-11-07 ENCOUNTER — Other Ambulatory Visit: Payer: Self-pay

## 2020-11-07 DIAGNOSIS — G629 Polyneuropathy, unspecified: Secondary | ICD-10-CM | POA: Diagnosis not present

## 2020-11-07 DIAGNOSIS — I87332 Chronic venous hypertension (idiopathic) with ulcer and inflammation of left lower extremity: Secondary | ICD-10-CM | POA: Insufficient documentation

## 2020-11-07 DIAGNOSIS — L97922 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: Secondary | ICD-10-CM | POA: Diagnosis present

## 2020-11-22 ENCOUNTER — Other Ambulatory Visit: Payer: Self-pay

## 2020-11-22 ENCOUNTER — Encounter (HOSPITAL_BASED_OUTPATIENT_CLINIC_OR_DEPARTMENT_OTHER): Payer: Medicare Other | Admitting: Internal Medicine

## 2020-11-22 DIAGNOSIS — I87332 Chronic venous hypertension (idiopathic) with ulcer and inflammation of left lower extremity: Secondary | ICD-10-CM | POA: Diagnosis not present

## 2020-11-22 NOTE — Progress Notes (Signed)
Jamie [name], Jamie W. ([id number]) Visit Report for 11/22/2020 Cellular or Tissue Based Product Details Patient Name: Date of Service: Jamie [name], [place] Jamie W. 11/22/2020 1:30 PM Medical Record Number: [id number] Patient Account Number: [id number] Date of Birth/Sex: Treating RN: 03/23/1941 (80 y.o. Jamie [name]) [name], Jamie Primary Care Provider: Delorise [name], Jamie Other Clinician: Referring Provider: Treating Provider/Extender: Jamie [name], Jamie [name], Jamie Weeks in Treatment: 20 Cellular or Tissue Based Product Type Wound #3 Left,Distal,Medial Lower Leg Applied to: Performed By: Physician Jamie [name], Jamie Camero G., MD Cellular or Tissue Based Product Type: Apligraf Level of Consciousness (Pre-procedure): Awake and Alert Pre-procedure Verification/Time Out Yes - 14:05 Taken: Location: trunk / arms / legs Wound Size (sq cm): 11.4 Product Size (sq cm): 44 Waste Size (sq cm): 11 Waste Reason: Wound Size Amount of Product Applied (sq cm): 33 Instrument Used: Blade, Forceps, Scissors Lot #: [id number] Expiration Date: 11/24/2020 Fenestrated: Yes Instrument: Blade Reconstituted: Yes Solution Type: normal saline Solution Amount: 3 Lot #: [id number] Solution Expiration Date: 12/07/2021 Secured: Yes Secured With: Steri-Strips Dressing Applied: Yes Primary Dressing: Adaptic, ABD, Compression Wrap Response to Treatment: Procedure was tolerated well Level of Consciousness (Post- Awake and Alert procedure): Post Procedure Diagnosis Same as Pre-procedure Electronic Signature(s) Signed: 11/22/2020 5:36:21 PM By: Baltazar Najjarobson, Edelin Fryer MD Entered By: Baltazar Najjarobson, Saide Lanuza on 11/22/2020 14:15:31 -------------------------------------------------------------------------------- HPI Details Patient Name: Date of Service: Jamie BrinkWILSO [name], Jamie Jamie W. 11/22/2020 1:30 PM Medical Record Number: [id number] Patient Account Number: [id number] Date of Birth/Sex: Treating RN: 06/08/1940 (80 y.o. Jamie [name]) [name],  Jamie Primary Care Provider: Delorise [name], Jamie Other Clinician: Referring Provider: Treating Provider/Extender: Jamie [name], Jamie [name] [name], Jamie Weeks in Treatment: 20 History of Present Illness HPI Description: ADMISSION 06/30/2020 [name] is an 80 year old woman who lives in [place]. She is here with her niece for review of wounds on the left medial lower leg and ankle. These have apparently been present for over a year and she followed with Dr. Olegario MessierKathy at the [facility] in [place] for quite a period of time although it looks as though there was a initial consult wound from Jamie [name] on February 18 presumably there was therefore hiatus. At that point the wounds were described as being there for 3 months. She also tells me she was at the wound care center in [place] for a period of time with this. 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 She had venous studies that were negative for DVT ABIs on the right were 1.01 on the left 1.06. She has . had previous applications of puraply, compression which she does not tolerate very well. She has had several rounds of oral antibiotic therapy. She complains of unrelenting pain and she is seeing Dr. Reece [name] V of pain management apparently was on oxycodone but that did not help. She also had a skin biopsy done by Dr. Olegario MessierKathy although we do not have that result. She does not appear to have an arterial issue. I am not completely clear what she has been putting on the wounds lately. 3/31; this patient has a particularly nasty set of wounds on the left medial ankle in the middle of what looks to be hemosiderin deposition secondary to chronic venous insufficiency. She has a lot of pain followed by pain management. Jamie [name] apparently did a biopsy of something on the left leg last fall what I would like to get this result. She has a history of MRSA treatment. I do not believe she had  reflux studies but she did have DVT  rule out studies. Previous ABIs have not suggested arterial insufficiency 4/7; difficult wounds on the left medial ankle probably chronic venous insufficiency. With considerable effort on behalf of our case manager we were able to finally to speak to somebody at the hospital in [place] who indicated that no biopsy of this area have been done even though the patient describes this in some detail and is even able to point out where she thinks the biopsy was done. We have been using Sorbact. The PCR culture I did showed polymicrobial identification with Pseudomonas, staph aureus, Peptostreptococcus. All of this and low titers. Resistance genes identified were MRSA, staph virulence gene and tetracycline. We are going to send this to [facility] for a topical antibiotic which is something that we have had good success with recently in large venous ulcers with a lot of purulent drainage. There would not be an easy oral alternative here possibly line escalated and ciprofloxacin if we need to use systemic antibiotic 4/15; difficult area on the left medial ankle. Most likely chronic venous insufficiency. I think she will probably need venous reflux study I think she had DVT rule outs but not venous reflux studies. We have not yet obtained the topical antibiotics. She has home health changing the dressing we have been using Sorbact for adherent fibrinous debris on the surface. Very difficult to remove 4/22; patient presents for 1 week follow-up. She has been using sore back under compression wraps and these are changed 3 times a week with home health. She also had Keystone antibiotics sent to her house and brought them in today. She has no complaints or issues today. 5/2; patient is here for follow-up. She has been using Sorbact under compression. Very painful wound. She has been using Keystone antibiotics. Not much improvement although the more medial part of the wound has cleaned  up nicely and the larger part of the wound about 50% slough covered. Part of the issue here is that she had stays at both [facility] wound care center, [facility] wound care center and now [place]. Not sure if she has had venous reflux studies. As far as we are able to tell she did not have a biopsy. My notes state that she did not have venous reflux studies just DVT rule outs. 5/16; patient goes for venous reflux studies this afternoon. She says that wound was biopsied which sounds like punch biopsies by Dr. Lynden Ang we do not have these results. We are using Sorbact. Very difficult wound to debride 5/23; patient presents for 1 week follow-up. She reports tolerating the wraps well with sorbact underneath. She had ABIs and venous reflux studies done. She has no issues or complaints today. She denies acute signs of infection. 6/6; I have reviewed the patient's vascular studies. Wounds are on the left medial and posterior calf. She had significant reflux in the greater saphenous vein in the in the mid thigh, distal thigh knee and the small saphenous vein in the popliteal fossa. The vein diameters do not look too impressive though. She is going to see the vascular surgeon on Wednesday. She also had venous reflux in the right common femoral vein. She did not have any evidence of a DVT or SVT I am wondering whether there is an ablation procedure that would benefit her in the greater saphenous vein on the left. . She tells [place] that home health put the dressing on too tight and she took off 1 layer. The swelling in her left leg is a little worse  as a result of this. Not much change in the wound measurements so the surface of the wound looks better Her arterial studies showed an ABI on the right of 1.14 with a triphasic waveform and a great toe pressure of 0.89. On the left her ABIs were noncompressible at 1.34 but with triphasic waveforms and a TBI of 0.97. Her greater toe pressure was 122. There was no evidence of  significant bilateral arterial disease 6/20 patient went to see Dr. Durwin Nora. He did not think she had significant arterial disease. In terms of her venous duplex on the right side there was no evidence of a DVT or SVT there was deep venous reflux involving the common femoral vein no superficial vein reflux on the left side there was no DVT or SVT there was no deep vein graft reflux there was some reflux in the greater saphenous vein from the mid thigh to the knee but the vein here was not dilated. He thought these were venous wounds he prescribed a compression pump but I am not sure who we ordered it from The patient has been approved for Apligraf. Still using silver collagen this week 7/5; Apligraf #1 7/19 Apligraf #2. Decent improvement in the condition of the wound bed. Epithelialization distal 8/2 Apligraf #3. No issues or complaints. Denies signs of infection. 8/17 Apligraf #4. No issues or concerns. Some complaints of pruritus and the rash. Our intake nurse brought up the fact that she had previously indicated possible cotton layer sensitivity we will therefore use kerlix in the bottom layer of the compression Electronic Signature(s) Signed: 11/22/2020 5:36:21 PM By: [name] MD Entered By: [name] on 11/22/2020 14:16:15 -------------------------------------------------------------------------------- Physical Exam Details Patient Name: Date of Service: Jamie [name], Jamie [name] 11/22/2020 1:30 PM Medical Record Number: [id number] Patient Account Number: [id number] Date of Birth/Sex: Treating RN: July 02, 1940 (80 y.o. Jamie Link Primary Care Provider: [name] Other Clinician: Referring Provider: Treating Provider/Extender: Jamie Budge in Treatment: 20 Constitutional Patient is hypertensive.. Pulse regular and within target range for patient.Marland Kitchen Respirations regular, non-labored and within target range.. Temperature is  normal and within the target range for the patient.Marland Kitchen Appears in no distress. Notes Wound exam left lower extremity healthy looking wound with decent granulation and epithelialization. This has made nice progress. There is no evidence of surrounding infection Electronic Signature(s) Signed: 11/22/2020 5:36:21 PM By: [name] MD Entered By: [name] on 11/22/2020 14:16:59 -------------------------------------------------------------------------------- Physician Orders Details Patient Name: Date of Service: Jamie [name], Jamie [name] 11/22/2020 1:30 PM Medical Record Number: [id number] Patient Account Number: [id number] Date of Birth/Sex: Treating RN: 18-Jun-1940 (80 y.o. [name] Primary Care Provider: [name] Other Clinician: Referring Provider: Treating Provider/Extender: Jamie Budge in Treatment: 20 Verbal / Phone Orders: No Diagnosis Coding Follow-up Appointments ppointment in 2 weeks. - with Dr. Leanord Hawking Return A Cellular or Tissue Based Products Cellular or Tissue Based Product Type: - Apligraf #3 11/07/2020, Apligraf #4 11/22/20 Bathing/ Shower/ Hygiene May shower with protection but do not get wound dressing(s) wet. Edema Control - Lymphedema / SCD / Other Elevate legs to the level of the heart or above for 30 minutes daily and/or when sitting, a frequency of: - throughout the day 3-4 times a day. Avoid standing for long periods of time. Exercise regularly Additional Orders / Instructions Follow Nutritious Diet Home Health New wound care orders this week; continue Home Health for wound care. May utilize formulary equivalent dressing for wound treatment orders  unless otherwise specified. - Change wrap and outer dressing, Do NOT remove skin substitute. Other Home Health Orders/Instructions: - [facility] fax #[phone] Wound Treatment Wound #3 - Lower Leg Wound Laterality: Left, Medial,  Distal Cleanser: Soap and Water (Home Health) 2 x Per Week/30 Days Discharge Instructions: May shower and wash wound with dial antibacterial soap and water prior to dressing change. Cleanser: Wound Cleanser (Home Health) 2 x Per Week/30 Days Discharge Instructions: Cleanse the wound with wound cleanser prior to applying a clean dressing using gauze sponges, not tissue or cotton balls. Peri-Wound Care: Triamcinolone 15 (g) 2 x Per Week/30 Days Discharge Instructions: In clinic only. Apply to red, irritated periwound Peri-Wound Care: Sween Lotion (Moisturizing lotion) (Home Health) 2 x Per Week/30 Days Discharge Instructions: apply at home. Apply moisturizing lotion as directed Secondary Dressing: Woven Gauze Sponge, Non-Sterile 4x4 in (Home Health) 2 x Per Week/30 Days Discharge Instructions: Apply over primary dressing as directed. Secondary Dressing: ABD Pad, 8x10 (Home Health) 2 x Per Week/30 Days Discharge Instructions: Apply over primary dressing as directed. Secondary Dressing: ADAPTIC TOUCH 3x4.25 in 2 x Per Week/30 Days Discharge Instructions: Apply over primary dressing as directed. Compression Wrap: ThreePress (3 layer compression wrap) 2 x Per Week/30 Days Discharge Instructions: Use Kerlix as base layer Electronic Signature(s) Signed: 11/22/2020 5:36:21 PM By: [name] MD Signed: 11/22/2020 6:24:39 PM By: [name] Entered By: [name] on 11/22/2020 14:13:13 -------------------------------------------------------------------------------- Problem List Details Patient Name: Date of Service: Jamie [name], Jamie [name] 11/22/2020 1:30 PM Medical Record Number: [id number] Patient Account Number: [id number] Date of Birth/Sex: Treating RN: February 11, 1941 (80 y.o. 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 11/22/2020 1:30 PM Medical Record Number: [id number] Patient Account Number: [id number] Date of Birth/Sex: Treating RN: 12-11-1940 (80 y.o. Jamie Link Primary Care Provider: [name] Other Clinician: Referring Provider: Treating Provider/Extender: [name] Weeks in Treatment: 20 Subjective History of Present Illness (HPI) ADMISSION 06/30/2020 [name] is an 81 year old woman who lives in [place]. She is here with her niece for review of wounds on the left medial lower leg and ankle. These have apparently been present for over a year and she followed with Dr. Olegario [name] at the [facility] in [place] for quite a period of time although it looks as though there was a initial consult wound from Dr. Marcha Solders on February 18 presumably there was therefore hiatus. At that point the wounds were described as being there for 3 months. She also tells me she was at the wound care center in [place] for a period of time with this. 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 She had venous studies that were negative for DVT ABIs on the right were 1.01 on the left 1.06. She has . had previous applications of puraply, compression which she does not tolerate very well. She has had  several rounds of oral antibiotic therapy. She  complains of unrelenting pain and she is seeing Dr. Reece Agar V of pain management apparently was on oxycodone but that did not help. She also had a skin biopsy done by Dr. Olegario [name] although we do not have that result. She does not appear to have an arterial issue. I am not completely clear what she has been putting on the wounds lately. 3/31; this patient has a particularly nasty set of wounds on the left medial ankle in the middle of what looks to be hemosiderin deposition secondary to chronic venous insufficiency. She has a lot of pain followed by pain management. Dr. Marcha Solders apparently did a biopsy of something on the left leg last fall what I would like to get this result. She has a history of MRSA treatment. I do not believe she had reflux studies but she did have DVT rule out studies. Previous ABIs have not suggested arterial insufficiency 4/7; difficult wounds on the left medial ankle probably chronic venous insufficiency. With considerable effort on behalf of our case manager we were able to finally to speak to somebody at the hospital in [place] who indicated that no biopsy of this area have been done even though the patient describes this in some detail and is even able to point out where she thinks the biopsy was done. We have been using Sorbact. The PCR culture I did showed polymicrobial identification with Pseudomonas, staph aureus, Peptostreptococcus. All of this and low titers. Resistance genes identified were MRSA, staph virulence gene and tetracycline. We are going to send this to [facility] for a topical antibiotic which is something that we have had good success with recently in large venous ulcers with a lot of purulent drainage. There would not be an easy oral alternative here possibly line escalated and ciprofloxacin if we need to use systemic antibiotic 4/15; difficult area on the left medial ankle. Most likely chronic venous insufficiency. I  think she will probably need venous reflux study I think she had DVT rule outs but not venous reflux studies. We have not yet obtained the topical antibiotics. She has home health changing the dressing we have been using Sorbact for adherent fibrinous debris on the surface. Very difficult to remove 4/22; patient presents for 1 week follow-up. She has been using sore back under compression wraps and these are changed 3 times a week with home health. She also had Keystone antibiotics sent to her house and brought them in today. She has no complaints or issues today. 5/2; patient is here for follow-up. She has been using Sorbact under compression. Very painful wound. She has been using Keystone antibiotics. Not much improvement although the more medial part of the wound has cleaned up nicely and the larger part of the wound about 50% slough covered. Part of the issue here is that she had stays at both [facility] wound care center, [facility] wound care center and now [place]. Not sure if she has had venous reflux studies. As far as we are able to tell she did not have a biopsy. My notes state that she did not have venous reflux studies just DVT rule outs. 5/16; patient goes for venous reflux studies this afternoon. She says that wound was biopsied which sounds like punch biopsies by Dr. Lynden Ang we do not have these results. We are using Sorbact. Very difficult wound to debride 5/23; patient presents for 1 week follow-up. She reports tolerating the wraps well with sorbact underneath. She had ABIs and venous reflux studies done. She has no  issues or complaints today. She denies acute signs of infection. 6/6; I have reviewed the patient's vascular studies. Wounds are on the left medial and posterior calf. She had significant reflux in the greater saphenous vein in the in the mid thigh, distal thigh knee and the small saphenous vein in the popliteal fossa. The vein diameters do not look too impressive though. She  is going to see the vascular surgeon on Wednesday. She also had venous reflux in the right common femoral vein. She did not have any evidence of a DVT or SVT I am wondering whether there is an ablation procedure that would benefit her in the greater saphenous vein on the left. . She tells [place] that home health put the dressing on too tight and she took off 1 layer. The swelling in her left leg is a little worse as a result of this. Not much change in the wound measurements so the surface of the wound looks better Her arterial studies showed an ABI on the right of 1.14 with a triphasic waveform and a great toe pressure of 0.89. On the left her ABIs were noncompressible at 1.34 but with triphasic waveforms and a TBI of 0.97. Her greater toe pressure was 122. There was no evidence of significant bilateral arterial disease 6/20 patient went to see Dr. Durwin Nora. He did not think she had significant arterial disease. In terms of her venous duplex on the right side there was no evidence of a DVT or SVT there was deep venous reflux involving the common femoral vein no superficial vein reflux on the left side there was no DVT or SVT there was no deep vein graft reflux there was some reflux in the greater saphenous vein from the mid thigh to the knee but the vein here was not dilated. He thought these were venous wounds he prescribed a compression pump but I am not sure who we ordered it from The patient has been approved for Apligraf. Still using silver collagen this week 7/5; Apligraf #1 7/19 Apligraf #2. Decent improvement in the condition of the wound bed. Epithelialization distal 8/2 Apligraf #3. No issues or complaints. Denies signs of infection. 8/17 Apligraf #4. No issues or concerns. Some complaints of pruritus and the rash. Our intake nurse brought up the fact that she had previously indicated possible cotton layer sensitivity we will therefore use kerlix in the bottom layer of the  compression Objective Constitutional Patient is hypertensive.. Pulse regular and within target range for patient.Marland Kitchen Respirations regular, non-labored and within target range.. Temperature is normal and within the target range for the patient.Marland Kitchen Appears in no distress. Vitals Time Taken: 1:41 PM, Height: 63 in, Weight: 150 lbs, BMI: 26.6, Temperature: 97.8 F, Pulse: 80 bpm, Respiratory Rate: 18 breaths/min, Blood Pressure: 151/82 mmHg. General Notes: Wound exam left lower extremity healthy looking wound with decent granulation and epithelialization. This has made nice progress. There is no evidence of surrounding infection Integumentary (Hair, Skin) Wound #3 status is Open. Original cause of wound was Gradually Appeared. The date acquired was: 04/09/2019. The wound has been in treatment 20 weeks. The wound is located on the Left,Distal,Medial Lower Leg. The wound measures 5.7cm length x 2cm width x 0.1cm depth; 8.954cm^2 area and 0.895cm^3 volume. There is Fat Layer (Subcutaneous Tissue) exposed. There is no tunneling or undermining noted. There is a medium amount of serosanguineous drainage noted. The wound margin is distinct with the outline attached to the wound base. There is large (67-100%) red, pink granulation  within the wound bed. There is a small (1-33%) amount of necrotic tissue within the wound bed including Adherent Slough. 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 A skin graft procedure using a bioengineered skin substitute/cellular or tissue based product was performed by Jamie Caul., MD with the following instrument(s): Blade, Forceps, and Scissors. Apligraf was applied and secured  with Steri-Strips. 33 sq cm of product was utilized and 11 sq cm was wasted due to Wound Size. Post Application, Adaptic, ABD, Compression Wrap was applied. A Time Out was conducted at 14:05, prior to the start of the procedure. The procedure was tolerated well. Post procedure Diagnosis Wound #3: Same as Pre-Procedure . 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 There was a Three Layer Compression Therapy Procedure by [name], RN. Post procedure Diagnosis Wound #3: Same as Pre-Procedure Plan Follow-up Appointments: Return Appointment in 2 weeks. - with Dr. Leanord Hawking Cellular or Tissue Based Products: Cellular or Tissue Based Product Type: - Apligraf #3 11/07/2020, Apligraf #4 11/22/20 Bathing/ Shower/ Hygiene: May shower with protection but do not get wound dressing(s) wet. Edema Control - Lymphedema / SCD / Other: Elevate legs to the level of the heart or above for 30 minutes daily and/or when sitting, a frequency of: - throughout the day 3-4 times a day. Avoid standing for long periods of time. Exercise regularly Additional Orders / Instructions: Follow Nutritious Diet Home Health: New wound care orders this week; continue Home Health for wound care. May utilize formulary equivalent dressing for wound treatment orders unless otherwise specified. - Change wrap and outer dressing, Do NOT remove skin substitute. Other Home Health Orders/Instructions: - [facility] fax #[phone] WOUND #3: - Lower Leg Wound Laterality: Left, Medial, Distal Cleanser: Soap and Water (Home Health) 2 x Per Week/30 Days Discharge Instructions: May shower and wash wound with dial antibacterial soap and water prior to dressing change. Cleanser: Wound Cleanser (Home Health) 2 x Per Week/30 Days Discharge Instructions: Cleanse the wound with wound cleanser prior to applying a clean dressing using gauze sponges, not tissue or cotton  balls. Peri-Wound Care: Triamcinolone 15 (g) 2 x Per Week/30 Days Discharge Instructions: In clinic only. Apply to red, irritated periwound Peri-Wound Care: Sween Lotion (Moisturizing lotion) (Home Health) 2 x Per Week/30 Days Discharge Instructions: apply at home. Apply moisturizing lotion as directed Secondary Dressing: Woven Gauze Sponge, Non-Sterile 4x4 in (Home Health) 2 x Per Week/30 Days Discharge Instructions: Apply over primary dressing as directed. Secondary Dressing: ABD Pad, 8x10 (Home Health) 2 x Per Week/30 Days Discharge Instructions: Apply over primary dressing as directed. Secondary Dressing: ADAPTIC TOUCH 3x4.25 in 2 x Per Week/30 Days Discharge Instructions: Apply over primary dressing as directed. Com pression Wrap: ThreePress (3 layer compression wrap) 2 x Per Week/30 Days Discharge Instructions: Use Kerlix as base layer 1. Apligraf #4 in the standard fashion 2. I think we are making progress. She has home health changing external dressings next week we will see her in 2 weeks Electronic Signature(s) Signed: 11/22/2020 5:36:21 PM By: [name] MD Entered By: [name] on 11/22/2020 14:17:34 -------------------------------------------------------------------------------- SuperBill Details Patient Name: Date of Service: Jamie [name], Jamie [name] 11/22/2020 Medical Record Number: [id number] Patient  Account Number: [id number] Date of Birth/Sex: Treating RN: April 29, 1940 (80 y.o. 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 Modifier: exposed Quantity: 44 CPT4 Code: 19147829 Description: 15271 - SKIN SUB GRAFT TRNK/ARM/LEG ICD-10 Diagnosis Description L97.922 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

## 2020-11-22 NOTE — Progress Notes (Signed)
Jamie [name], Jamie [name] ([id number]) Visit Report for 11/22/2020 Arrival Information Details Patient Name: Date of Service: Jamie [name], [name]. 11/22/2020 1:30 PM Medical Record Number: [id number] Patient Account Number: [id number] Date of Birth/Sex: Treating RN: 1940/05/19 (80 y.o. [name] Primary Care Bowie Doiron: [name] Other Clinician: Referring Deberah Adolf: Treating Jearline Hirschhorn/Extender: Vivia Budge in Treatment: 20 Visit Information History Since Last Visit Added or deleted any medications: No Patient Arrived: Wheel Chair Any new allergies or adverse reactions: No Arrival Time: 13:37 Had a fall or experienced change in No Accompanied By: Daughter activities of daily living that may affect Transfer Assistance: None risk of falls: Patient Identification Verified: Yes Signs or symptoms of abuse/neglect since last visito No Secondary Verification Process Completed: Yes Hospitalized since last visit: No Patient Requires Transmission-Based Precautions: No Implantable device outside of the clinic excluding No Patient Has Alerts: Yes cellular tissue based products placed in the center Patient Alerts: L ABI: 1.06; R ABI: 1.01 since last visit: Has Dressing in Place as Prescribed: Yes Has Compression in Place as Prescribed: Yes Pain Present Now: No Electronic Signature(s) Signed: 11/22/2020 6:24:39 PM By: [name] Entered By: [name] on 11/22/2020 13:41:23 -------------------------------------------------------------------------------- Compression Therapy Details Patient Name: Date of Service: Jamie [name], [name] 11/22/2020 1:30 PM Medical Record Number: [id number] Patient Account Number: [id number] Date of Birth/Sex: Treating RN: 1940/10/15 (80 y.o. [name] Primary Care Clova Morlock: [name] Other Clinician: Referring Deniss Wormley: Treating Estie Sproule/Extender: Vivia Budge in Treatment: 20 Compression Therapy Performed for Wound Assessment: Wound #3 Left,Distal,Medial Lower Leg Performed By: Clinician [name], RN Compression Type: Three Layer Post Procedure Diagnosis Same as Pre-procedure Electronic Signature(s) Signed: 11/22/2020 6:24:39 PM By: [name] Entered By: [name] on 11/22/2020 14:11:55 -------------------------------------------------------------------------------- Encounter Discharge Information Details Patient Name: Date of Service: Jamie [name], [name] 11/22/2020 1:30 PM Medical Record Number: [id number] Patient Account Number: [id number] Date of Birth/Sex: Treating RN: 07/06/1940 (80 y.o. [name] Primary Care Armilda Vanderlinden: [name] Other Clinician: Referring Zaidan Keeble: Treating Orissa Arreaga/Extender: Vivia Budge in Treatment: 20 Encounter Discharge Information Items Post Procedure Vitals Discharge Condition: Stable Temperature (F): 97.8 Ambulatory Status: Wheelchair Pulse (bpm): 80 Discharge Destination: Home Respiratory Rate (breaths/min): 18 Transportation: Private Auto Blood Pressure (mmHg): 151/82 Accompanied By: Daughter Schedule Follow-up Appointment: Yes Clinical Summary of Care: Provided on 11/22/2020 Form Type Recipient Paper Patient Patient Electronic Signature(s) Signed: 11/22/2020 6:24:39 PM By: [name] Entered By: [name] on 11/22/2020 14:29:30 -------------------------------------------------------------------------------- Lower Extremity Assessment Details Patient Name: Date of Service: Jamie [name], [name] 11/22/2020 1:30 PM Medical Record Number: [id number] Patient Account Number: [id number] Date of Birth/Sex: Treating RN: 03/29/1941 (80 y.o. [name] Primary Care Kobey Sides: [name] Other Clinician: Referring Jayme Mednick: Treating Naveah Brave/Extender:  [name] Weeks in Treatment: 20 Edema Assessment Assessed: [name]: Yes] [Right: No] Edema: [Left: N] [Right: o] Calf Left: Right: Point of Measurement: 29 cm From Medial Instep 35 cm Ankle Left: Right: Point of Measurement: 11 cm From Medial Instep 24.5 cm Vascular Assessment Pulses: Dorsalis Pedis Palpable: [Left:Yes] Electronic Signature(s) Signed: 11/22/2020 6:24:39 PM By: [name] Entered By: [name] on 11/22/2020 13:49:56 -------------------------------------------------------------------------------- Multi Wound Chart Details Patient Name: Date of Service: Jamie [name], [name] 11/22/2020 1:30 PM Medical Record Number: [id number] Patient Account Number: [id number] Date of Birth/Sex: Treating RN: 04-Dec-1940 (80 y.o. [name] Primary Care Divya Munshi: [name] Other Clinician: Referring Holle Sprick: Treating Axxel Gude/Extender: Janee Morn, Billey Gosling  in Treatment: 20 Vital Signs Height(in): 63 Pulse(bpm): 80 Weight(lbs): 150 Blood Pressure(mmHg): 151/82 Body Mass Index(BMI): 27 Temperature(F): 97.8 Respiratory Rate(breaths/min): 18 Photos: [N/A:N/A] Left, Distal, Medial Lower Leg N/A N/A Wound Location: Gradually Appeared N/A N/A Wounding Event: Venous Leg Ulcer N/A N/A Primary Etiology: Sleep Apnea, Hypertension, Peripheral N/A N/A Comorbid History: Venous Disease, Osteoarthritis, Neuropathy 04/09/2019 N/A N/A Date Acquired: 20 N/A N/A Weeks of Treatment: Open N/A N/A Wound Status: 5.7x2x0.1 N/A N/A Measurements L x W x D (cm) 8.954 N/A N/A A (cm) : rea 0.895 N/A N/A Volume (cm) : -26.40% N/A N/A % Reduction in Area: 36.80% N/A N/A % Reduction in Volume: Full Thickness Without Exposed N/A N/A Classification: Support Structures Medium N/A N/A Exudate Amount: Serosanguineous N/A N/A Exudate Type: red, brown N/A N/A Exudate Color: Distinct, outline  attached N/A N/A Wound Margin: Large (67-100%) N/A N/A Granulation Amount: Red, Pink N/A N/A Granulation Quality: Small (1-33%) N/A N/A Necrotic Amount: Fat Layer (Subcutaneous Tissue): Yes N/A N/A Exposed Structures: Fascia: No Tendon: No Muscle: No Joint: No Bone: No Medium (34-66%) N/A N/A Epithelialization: Cellular or Tissue Based Product N/A N/A Procedures Performed: Compression Therapy Treatment Notes Electronic Signature(s) Signed: 11/22/2020 5:36:21 PM By: [name] MD Signed: 11/22/2020 6:11:36 PM By: Zandra Abts RN, BSN Entered By: [name] on 11/22/2020 14:15:12 -------------------------------------------------------------------------------- Multi-Disciplinary Care Plan Details Patient Name: Date of Service: Jamie Brink, [name] 11/22/2020 1:30 PM Medical Record Number: [id number] Patient Account Number: [id number] Date of Birth/Sex: Treating RN: Jul 27, 1940 (80 y.o. [name] Primary Care Nohely Whitehorn: [name] Other Clinician: Referring Zendayah Hardgrave: Treating Eniyah Eastmond/Extender: Vivia Budge in Treatment: 20 Active Inactive Venous Leg Ulcer Nursing Diagnoses: Actual venous Insuffiency (use after diagnosis is confirmed) Goals: Patient will maintain optimal edema control Date Initiated: 06/30/2020 Target Resolution Date: 12/13/2020 Goal Status: Active Interventions: Assess peripheral edema status every visit. Compression as ordered Notes: Wound/Skin Impairment Nursing Diagnoses: Impaired tissue integrity Goals: Patient/caregiver will verbalize understanding of skin care regimen Date Initiated: 06/30/2020 Target Resolution Date: 12/13/2020 Goal Status: Active Ulcer/skin breakdown will have a volume reduction of 30% by week 4 Date Initiated: 06/30/2020 Date Inactivated: 08/07/2020 Target Resolution Date: 08/04/2020 Goal Status: Unmet Unmet Reason: non viable surface Ulcer/skin breakdown  will have a volume reduction of 50% by week 8 Date Initiated: 10/10/2020 Target Resolution Date: 12/13/2020 Goal Status: Active Interventions: Assess patient/caregiver ability to obtain necessary supplies Assess patient/caregiver ability to perform ulcer/skin care regimen upon admission and as needed Assess ulceration(s) every visit Provide education on ulcer and skin care Treatment Activities: Skin care regimen initiated : 06/30/2020 Topical wound management initiated : 06/30/2020 Notes: Electronic Signature(s) Signed: 11/22/2020 6:24:39 PM By: [name] Entered By: [name] on 11/22/2020 13:53:11 -------------------------------------------------------------------------------- Pain Assessment Details Patient Name: Date of Service: Jamie [name], [name] 11/22/2020 1:30 PM Medical Record Number: [id number] Patient Account Number: [id number] Date of Birth/Sex: Treating RN: 1940-08-24 (80 y.o. [name] Primary Care Anais Koenen: [name] Other Clinician: Referring Lynden Carrithers: Treating Andros Channing/Extender: Vivia Budge in Treatment: 20 Active Problems Location of Pain Severity and Description of Pain Patient Has Paino No Site Locations Pain Management and Medication Current Pain Management: Electronic Signature(s) Signed: 11/22/2020 6:24:39 PM By: [name] Entered By: [name] on 11/22/2020 13:41:53 -------------------------------------------------------------------------------- Patient/Caregiver Education Details Patient Name: Date of Service: Jamie [name] 8/17/2022andnbsp1:30 PM Medical Record Number: [id number] Patient Account Number: [id number] Date of Birth/Gender: Treating RN: 1940/05/31 (80 y.o. [name] Primary Care Physician: Janeece Fitting,  Sharlyne Cai Other Clinician: Referring Physician: Treating Physician/Extender: Vivia Budge in  Treatment: 20 Education Assessment Education Provided To: Patient Education Topics Provided Venous: Methods: Explain/Verbal, Printed Responses: State content correctly Wound/Skin Impairment: Methods: Explain/Verbal, Printed Responses: State content correctly Electronic Signature(s) Signed: 11/22/2020 6:24:39 PM By: [name] Entered By: [name] on 11/22/2020 13:54:40 -------------------------------------------------------------------------------- Wound Assessment Details Patient Name: Date of Service: Jamie [name], [name] 11/22/2020 1:30 PM Medical Record Number: [id number] Patient Account Number: [id number] Date of Birth/Sex: Treating RN: Dec 31, 1940 (80 y.o. [name] Primary Care Jens Siems: [name] Other Clinician: Referring Ramiya Delahunty: Treating Lorcan Shelp/Extender: [name] Weeks in Treatment: 20 Wound Status Wound Number: 3 Primary Venous Leg Ulcer Etiology: Wound Location: Left, Distal, Medial Lower Leg Wound Open Wounding Event: Gradually Appeared Status: Date Acquired: 04/09/2019 Comorbid Sleep Apnea, Hypertension, Peripheral Venous Disease, Weeks Of Treatment: 20 History: Osteoarthritis, Neuropathy Clustered Wound: No Photos Wound Measurements Length: (cm) 5.7 Width: (cm) 2 Depth: (cm) 0.1 Area: (cm) 8.954 Volume: (cm) 0.895 % Reduction in Area: -26.4% % Reduction in Volume: 36.8% Epithelialization: Medium (34-66%) Tunneling: No Undermining: No Wound Description Classification: Full Thickness Without Exposed Support Structures Wound Margin: Distinct, outline attached Exudate Amount: Medium Exudate Type: Serosanguineous Exudate Color: red, brown Foul Odor After Cleansing: No Slough/Fibrino Yes Wound Bed Granulation Amount: Large (67-100%) Exposed Structure Granulation Quality: Red, Pink Fascia Exposed: No Necrotic Amount: Small (1-33%) Fat Layer (Subcutaneous Tissue) Exposed:  Yes Necrotic Quality: Adherent Slough Tendon Exposed: No Muscle Exposed: No Joint Exposed: No Bone Exposed: No Treatment Notes Wound #3 (Lower Leg) Wound Laterality: Left, Medial, Distal Cleanser Soap and Water Discharge Instruction: May shower and wash wound with dial antibacterial soap and water prior to dressing change. Wound Cleanser Discharge Instruction: Cleanse the wound with wound cleanser prior to applying a clean dressing using gauze sponges, not tissue or cotton balls. Peri-Wound Care Triamcinolone 15 (g) Discharge Instruction: In clinic only. Apply to red, irritated periwound Sween Lotion (Moisturizing lotion) Discharge Instruction: apply at home. Apply moisturizing lotion as directed Topical Primary Dressing Secondary Dressing Woven Gauze Sponge, Non-Sterile 4x4 in Discharge Instruction: Apply over primary dressing as directed. ABD Pad, 8x10 Discharge Instruction: Apply over primary dressing as directed. ADAPTIC TOUCH 3x4.25 in Discharge Instruction: Apply over primary dressing as directed. Secured With Compression Wrap ThreePress (3 layer compression wrap) Discharge Instruction: Use Kerlix as base layer Compression Stockings Add-Ons Electronic Signature(s) Signed: 11/22/2020 6:24:39 PM By: [name] Entered By: [name] on 11/22/2020 13:54:16 -------------------------------------------------------------------------------- Vitals Details Patient Name: Date of Service: Jamie Brink, [name] 11/22/2020 1:30 PM Medical Record Number: [id number] Patient Account Number: [id number] Date of Birth/Sex: Treating RN: February 23, 1941 (80 y.o. [name] Primary Care Genean Adamski: [name] Other Clinician: Referring Lilliahna Schubring: Treating Armanie Martine/Extender: Vivia Budge in Treatment: 20 Vital Signs Time Taken: 13:41 Temperature (F): 97.8 Height (in): 63 Pulse (bpm): 80 Weight (lbs): 150 Respiratory Rate  (breaths/min): 18 Body Mass Index (BMI): 26.6 Blood Pressure (mmHg): 151/82 Reference Range: 80 - 120 mg / dl Electronic Signature(s) Signed: 11/22/2020 6:24:39 PM By: [name] Entered By: [name] on 11/22/2020 13:41:47

## 2020-12-05 ENCOUNTER — Other Ambulatory Visit: Payer: Self-pay

## 2020-12-05 ENCOUNTER — Encounter (HOSPITAL_BASED_OUTPATIENT_CLINIC_OR_DEPARTMENT_OTHER): Payer: Medicare Other | Admitting: Internal Medicine

## 2020-12-05 DIAGNOSIS — I87332 Chronic venous hypertension (idiopathic) with ulcer and inflammation of left lower extremity: Secondary | ICD-10-CM | POA: Diagnosis not present

## 2020-12-06 NOTE — Progress Notes (Signed)
Jamie [name], Yeira W. ([id number]) Visit Report for 12/05/2020 HPI Details Patient Name: Date of Service: Glennis BrinkWILSO N, [place] [name] 12/05/2020 12:30 PM Medical Record Number: [id number] Patient Account Number: [id number] Date of Birth/Sex: Treating RN: 12/09/1940 (80 y.o. Toniann FailF) Breedlove, Lauren Primary Care Provider: Delorise JacksonEggleston-Clark, [name] Other Clinician: Referring Provider: Treating Provider/Extender: Vivia Budgeobson, Marietta Sikkema Eggleston-Clark, [name] in Treatment: 22 History of Present Illness HPI Description: ADMISSION 06/30/2020 [name] is an 80 year old woman who lives in [place]. She is here with her niece for review of wounds on the left medial lower leg and ankle. These have apparently been present for over a year and she followed with Dr. Olegario MessierKathy at the [facility] in [place] for quite a period of time although it looks as though there was a initial consult wound from Dr. Marcha Soldersathey on February 18 presumably there was therefore hiatus. At that point the wounds were described as being there for 3 months. She also tells me she was at the wound care center in [place] for a period of time with this. 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 She had venous studies that were negative for DVT ABIs on the right were 1.01 on the left 1.06. She has . had previous applications of puraply, compression which she does not tolerate very well. She has had several rounds of oral antibiotic therapy. She complains of unrelenting pain and she is seeing Dr. Reece AgarEspy V of pain management apparently was on oxycodone but that did not help. She also had a skin biopsy done by Dr. Olegario MessierKathy although we do not have that result. She does not appear to have an arterial issue. I am not completely clear what she has been putting on the wounds lately. 3/31; this patient has a particularly nasty set of wounds on the left medial ankle in the middle of what looks to be  hemosiderin deposition secondary to chronic venous insufficiency. She has a lot of pain followed by pain management. Dr. Marcha Soldersathey apparently did a biopsy of something on the left leg last fall what I would like to get this result. She has a history of MRSA treatment. I do not believe she had reflux studies but she did have DVT rule out studies. Previous ABIs have not suggested arterial insufficiency 4/7; difficult wounds on the left medial ankle probably chronic venous insufficiency. With considerable effort on behalf of our case manager we were able to finally to speak to somebody at the hospital in [place] who indicated that no biopsy of this area have been done even though the patient describes this in some detail and is even able to point out where she thinks the biopsy was done. We have been using Sorbact. The PCR culture I did showed polymicrobial identification with Pseudomonas, staph aureus, Peptostreptococcus. All of this and low titers. Resistance genes identified were MRSA, staph virulence gene and tetracycline. We are [facility] antibiotic which is something that we have had good success with recently in large venous ulcers with a lot of purulent drainage. There would not be an easy oral alternative here possibly line escalated and ciprofloxacin if we need to use systemic antibiotic 4/15; difficult area on the left medial ankle. Most likely chronic venous insufficiency. I think she will probably need venous reflux study I think she had DVT rule outs but not venous reflux studies. We have not yet obtained the topical antibiotics. She has home health changing the  dressing we have been using Sorbact for adherent fibrinous debris on the surface. Very difficult to remove 4/22; patient presents for 1 week follow-up. She has been using sore back under compression wraps and these are changed 3 times a week with home health. She also had [facility] antibiotics sent to her  house and brought them in today. She has no complaints or issues today. 5/2; patient is here for follow-up. She has been using Sorbact under compression. Very painful wound. She has been using [facility] antibiotics. Not much improvement although the more medial part of the wound has cleaned up nicely and the larger part of the wound about 50% slough covered. Part of the issue here is that she had stays at both [facility] wound care center, [facility] wound care center and now [place]. Not sure if she has had venous reflux studies. As far as we are able to tell she did not have a biopsy. My notes state that she did not have venous reflux studies just DVT rule outs. 5/16; patient goes for venous reflux studies this afternoon. She says that wound was biopsied which sounds like punch biopsies by Dr. Lynden Ang we do not have these results. We are using Sorbact. Very difficult wound to debride 5/23; patient presents for 1 week follow-up. She reports tolerating the wraps well with sorbact underneath. She had ABIs and venous reflux studies done. She has no issues or complaints today. She denies acute signs of infection. 6/6; I have reviewed the patient's vascular studies. Wounds are on the left medial and posterior calf. She had significant reflux in the greater saphenous vein in the in the mid thigh, distal thigh knee and the small saphenous vein in the popliteal fossa. The vein diameters do not look too impressive though. She is going to see the vascular surgeon on Wednesday. She also had venous reflux in the right common femoral vein. She did not have any evidence of a DVT or SVT I am wondering whether there is an ablation procedure that would benefit her in the greater saphenous vein on the left. . She tells [place] that home health put the dressing on too tight and she took off 1 layer. The swelling in her left leg is a little worse as a result of this. Not much change in the wound measurements so the surface of the wound  looks better Her arterial studies showed an ABI on the right of 1.14 with a triphasic waveform and a great toe pressure of 0.89. On the left her ABIs were noncompressible at 1.34 but with triphasic waveforms and a TBI of 0.97. Her greater toe pressure was 122. There was no evidence of significant bilateral arterial disease 6/20 patient went to see Dr. Durwin Nora. He did not think she had significant arterial disease. In terms of her venous duplex on the right side there was no evidence of a DVT or SVT there was deep venous reflux involving the common femoral vein no superficial vein reflux on the left side there was no DVT or SVT there was no deep vein graft reflux there was some reflux in the greater saphenous vein from the mid thigh to the knee but the vein here was not dilated. He thought these were venous wounds he prescribed a compression pump but I am not sure who we ordered it from The patient has been approved for Apligraf. Still using silver collagen this week 7/5; Apligraf #1 7/19 Apligraf #2. Decent improvement in the condition of the wound bed. Epithelialization  distal 8/2 Apligraf #3. No issues or complaints. Denies signs of infection. 8/17 Apligraf #4. No issues or concerns. Some complaints of pruritus and the rash. Our intake nurse brought up the fact that she had previously indicated possible cotton layer sensitivity we will therefore use kerlix in the bottom layer of the compression 8/30; the patient comes in with the area on her left medial leg just about healed. There is a superficial area more towards the tibia and a smaller open area distally everything else is epithelialized. I do not think she requires another Apligraf Electronic Signature(s) Signed: 12/06/2020 7:53:49 AM By: [name] MD Entered By: [name] on 12/05/2020 13:18:57 -------------------------------------------------------------------------------- Physical Exam Details Patient Name: Date of  Service: [name] 12/05/2020 12:30 PM Medical Record Number: [id number] Patient Account Number: [id number] Date of Birth/Sex: Treating RN: [birth year] (80 y.o. [name] Primary Care Provider: [name] Other Clinician: Referring Provider: Treating Provider/Extender: [name] Weeks in Treatment: 22 Constitutional Sitting or standing Blood Pressure is within target range for patient.. Pulse regular and within target range for patient.Marland Kitchen Respirations regular, non-labored and within target range.. Temperature is normal and within the target range for the patient.Marland Kitchen Appears in no distress. Notes Wound exam; left medial lower leg everything is closed except for 2 areas. I could not justify another Apligraf. Electronic Signature(s) Signed: 12/06/2020 7:53:49 AM By: [name] MD Entered By: [name] on 12/05/2020 13:19:38 -------------------------------------------------------------------------------- Physician Orders Details Patient Name: Date of Service: [name], Christean Grief. 12/05/2020 12:30 PM Medical Record Number: [id number] Patient Account Number: [id number] Date of Birth/Sex: Treating RN: 1940/12/25 (80 y.o. 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 - with Dr. Leanord Hawking Return A Cellular or Tissue Based Products Cellular or Tissue Based Product Type: - Apligraf #3 11/07/2020, Apligraf #4 11/22/20 Bathing/ Shower/ Hygiene May shower with  protection but do not get wound dressing(s) wet. Edema Control - Lymphedema / SCD / Other Elevate legs to the level of the heart or above for 30 minutes daily and/or when sitting, a frequency of: - throughout the day 3-4 times a day. Avoid standing for long periods of time. Exercise regularly Additional Orders / Instructions Follow Nutritious Diet Home Health New wound care orders this week; continue Home Health for wound care. May utilize formulary equivalent dressing for wound treatment orders unless otherwise specified. - Hydrofera Blue to wound, no skin sub applied this week. Other Home Health Orders/Instructions: - [facility] fax #[phone] Wound Treatment Wound #3 - Lower Leg Wound Laterality: Left, Medial, Distal Cleanser: Soap and Water (Home Health) 2 x Per Week/30 Days Discharge Instructions: May shower and wash wound with dial antibacterial soap and water prior to dressing change. Cleanser: Wound Cleanser (Home Health) 2 x Per Week/30 Days Discharge Instructions: Cleanse the wound with wound cleanser prior to applying a clean dressing using gauze sponges, not tissue or cotton balls. Peri-Wound Care: Triamcinolone 15 (g) 2 x Per Week/30 Days Discharge Instructions: In clinic only. Apply to red, irritated periwound Peri-Wound Care: Sween Lotion (Moisturizing lotion) (Home Health) 2 x Per Week/30 Days Discharge Instructions: apply at home. Apply moisturizing lotion  as directed Prim Dressing: Hydrofera Blue Ready Foam, 2.5 x2.5 in (Home Health) 2 x Per Week/30 Days ary Discharge Instructions: Apply to wound bed as instructed Secondary Dressing: ABD Pad, 8x10 (Home Health) 2 x Per Week/30 Days Discharge Instructions: Apply over primary dressing as directed. Compression Wrap: ThreePress (3 layer compression wrap) 2 x Per Week/30 Days Discharge Instructions: Use Kerlix as base layer Electronic Signature(s) Signed: 12/05/2020 5:23:58 PM By: [name] Signed: 12/06/2020 7:53:49 AM By: [name] MD Entered By: [name] on 12/05/2020 12:59:06 -------------------------------------------------------------------------------- Problem List Details Patient Name: Date of Service: [name], Christean Grief. 12/05/2020 12:30 PM Medical Record Number: [id number] Patient Account Number: [id number] Date of Birth/Sex: Treating RN: [birth year] (80 y.o. 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 12/05/2020 12:30 PM Medical Record Number: [id number] Patient Account Number: [id number] Date of Birth/Sex: Treating RN: 09-Mar-1941 (80 y.o. [name] Primary Care Provider: [name] Other Clinician: Referring Provider: Treating Provider/Extender: [name] Weeks in Treatment: 22 Subjective History of Present Illness (HPI) ADMISSION 06/30/2020 Mrs. Caporaso is an 80 year old woman who lives in [place]. She is here with her niece for review of wounds on the left medial lower leg and ankle. These have apparently been present for over a year and she followed with Dr. Olegario Messier at the [facility] in [place] for  quite a period of time although it looks as though there was a initial consult wound from Dr. Marcha Solders on February 18 presumably there was therefore hiatus. At that point the wounds were described as being there for 3 months. She also tells me she was at the wound care center in [place] for a period of time with this. 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 She had venous studies that were negative for DVT ABIs on the right were 1.01 on the left 1.06. She has . had previous applications of puraply, compression which she does not tolerate very well. She has had several rounds of oral antibiotic therapy. She complains of unrelenting pain and she is seeing Dr. Reece Agar V of pain management apparently was on oxycodone but that did not help. She also had a skin biopsy done by Dr. Olegario Messier although we do not have that result. She does not appear to have an arterial issue. I am not completely clear what she has been putting on the wounds lately. 3/31; this patient has a particularly nasty set of wounds on the left medial ankle in the middle of what looks to be hemosiderin deposition secondary to chronic venous insufficiency. She has a lot of pain followed by pain management. Dr. Marcha Solders apparently did a biopsy of something on the left leg last fall what I would like to get this result. She has a history of MRSA treatment. I do not believe she had reflux studies but she did have DVT rule out studies. Previous ABIs have not suggested arterial insufficiency  4/7; difficult wounds on the left medial ankle probably chronic venous insufficiency. With considerable effort on behalf of our case manager we were able to finally to speak to somebody at the hospital in [place] who indicated that no biopsy of this area have been done even though the patient describes this in some detail and is even able to point out where she thinks the biopsy was done. We have been using Sorbact.  The PCR culture I did showed polymicrobial identification with Pseudomonas, staph aureus, Peptostreptococcus. All of this and low titers. Resistance genes identified were MRSA, staph virulence gene and tetracycline. We are going to send this to [facility] for a topical antibiotic which is something that we have had good success with recently in large venous ulcers with a lot of purulent drainage. There would not be an easy oral alternative here possibly line escalated and ciprofloxacin if we need to use systemic antibiotic 4/15; difficult area on the left medial ankle. Most likely chronic venous insufficiency. I think she will probably need venous reflux study I think she had DVT rule outs but not venous reflux studies. We have not yet obtained the topical antibiotics. She has home health changing the dressing we have been using Sorbact for adherent fibrinous debris on the surface. Very difficult to remove 4/22; patient presents for 1 week follow-up. She has been using sore back under compression wraps and these are changed 3 times a week with home health. She also had [facility] antibiotics sent to her house and brought them in today. She has no complaints or issues today. 5/2; patient is here for follow-up. She has been using Sorbact under compression. Very painful wound. She has been using [facility] antibiotics. Not much improvement although the more medial part of the wound has cleaned up nicely and the larger part of the wound about 50% slough covered. Part of the issue here is that she had stays at both [facility] wound care center, [facility] wound care center and now [place]. Not sure if she has had venous reflux studies. As far as we are able to tell she did not have a biopsy. My notes state that she did not have venous reflux studies just DVT rule outs. 5/16; patient goes for venous reflux studies this afternoon. She says that wound was biopsied which sounds like punch biopsies by Dr. Lynden Ang we do not  have these results. We are using Sorbact. Very difficult wound to debride 5/23; patient presents for 1 week follow-up. She reports tolerating the wraps well with sorbact underneath. She had ABIs and venous reflux studies done. She has no issues or complaints today. She denies acute signs of infection. 6/6; I have reviewed the patient's vascular studies. Wounds are on the left medial and posterior calf. She had significant reflux in the greater saphenous vein in the in the mid thigh, distal thigh knee and the small saphenous vein in the popliteal fossa. The vein diameters do not look too impressive though. She is going to see the vascular surgeon on Wednesday. She also had venous reflux in the right common femoral vein. She did not have any evidence of a DVT or SVT I am wondering whether there is an ablation procedure that would benefit her in the greater saphenous vein on the left. . She tells [place] that home health put the dressing on too tight and she took off 1 layer. The swelling in her left leg is a little worse as a result of this. Not much change in  the wound measurements so the surface of the wound looks better Her arterial studies showed an ABI on the right of 1.14 with a triphasic waveform and a great toe pressure of 0.89. On the left her ABIs were noncompressible at 1.34 but with triphasic waveforms and a TBI of 0.97. Her greater toe pressure was 122. There was no evidence of significant bilateral arterial disease 6/20 patient went to see Dr. Durwin Nora. He did not think she had significant arterial disease. In terms of her venous duplex on the right side there was no evidence of a DVT or SVT there was deep venous reflux involving the common femoral vein no superficial vein reflux on the left side there was no DVT or SVT there was no deep vein graft reflux there was some reflux in the greater saphenous vein from the mid thigh to the knee but the vein here was not dilated. He thought these were  venous wounds he prescribed a compression pump but I am not sure who we ordered it from The patient has been approved for Apligraf. Still using silver collagen this week 7/5; Apligraf #1 7/19 Apligraf #2. Decent improvement in the condition of the wound bed. Epithelialization distal 8/2 Apligraf #3. No issues or complaints. Denies signs of infection. 8/17 Apligraf #4. No issues or concerns. Some complaints of pruritus and the rash. Our intake nurse brought up the fact that she had previously indicated possible cotton layer sensitivity we will therefore use kerlix in the bottom layer of the compression 8/30; the patient comes in with the area on her left medial leg just about healed. There is a superficial area more towards the tibia and a smaller open area distally everything else is epithelialized. I do not think she requires another Apligraf Objective Constitutional Sitting or standing Blood Pressure is within target range for patient.. Pulse regular and within target range for patient.Marland Kitchen Respirations regular, non-labored and within target range.. Temperature is normal and within the target range for the patient.Marland Kitchen Appears in no distress. Vitals Time Taken: 12:39 PM, Height: 63 in, Weight: 150 lbs, BMI: 26.6, Temperature: 98.8 F, Pulse: 80 bpm, Respiratory Rate: 18 breaths/min, Blood Pressure: 133/82 mmHg. General Notes: Wound exam; left medial lower leg everything is closed except for 2 areas. I could not justify another Apligraf. Integumentary (Hair, Skin) Wound #3 status is Open. Original cause of wound was Gradually Appeared. The date acquired was: 04/09/2019. The wound has been in treatment 22 weeks. The wound is located on the Left,Distal,Medial Lower Leg. The wound measures 2cm length x 1.5cm width x 0.1cm depth; 2.356cm^2 area and 0.236cm^3 volume. There is Fat Layer (Subcutaneous Tissue) exposed. There is no tunneling or undermining noted. There is a medium amount of serosanguineous  drainage noted. The wound margin is distinct with the outline attached to the wound base. There is large (67-100%) red, pink granulation within the wound bed. There is no necrotic tissue within the wound bed. 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 There was a Three Layer Compression Therapy Procedure by [name], RN. Post procedure Diagnosis Wound #3: Same as Pre-Procedure Plan Follow-up Appointments: Return Appointment in 2 weeks. - with Dr. Leanord Hawking Cellular or Tissue Based Products: Cellular or Tissue Based Product Type: - Apligraf #3  11/07/2020, Apligraf #4 11/22/20 Bathing/ Shower/ Hygiene: May shower with protection but do not get wound dressing(s) wet. Edema Control - Lymphedema / SCD / Other: Elevate legs to the level of the heart or above for 30 minutes daily and/or when sitting, a frequency of: - throughout the day 3-4 times a day. Avoid standing for long periods of time. Exercise regularly Additional Orders / Instructions: Follow Nutritious Diet Home Health: New wound care orders this week; continue Home Health for wound care. May utilize formulary equivalent dressing for wound treatment orders unless otherwise specified. - Hydrofera Blue to wound, no skin sub applied this week. Other Home Health Orders/Instructions: - [facility] fax #[phone] WOUND #3: - Lower Leg Wound Laterality: Left, Medial, Distal Cleanser: Soap and Water (Home Health) 2 x Per Week/30 Days Discharge Instructions: May shower and wash wound with dial antibacterial soap and water prior to dressing change. Cleanser: Wound Cleanser (Home Health) 2 x Per Week/30 Days Discharge  Instructions: Cleanse the wound with wound cleanser prior to applying a clean dressing using gauze sponges, not tissue or cotton balls. Peri-Wound Care: Triamcinolone 15 (g) 2 x Per Week/30 Days Discharge Instructions: In clinic only. Apply to red, irritated periwound Peri-Wound Care: Sween Lotion (Moisturizing lotion) (Home Health) 2 x Per Week/30 Days Discharge Instructions: apply at home. Apply moisturizing lotion as directed Prim Dressing: Hydrofera Blue Ready Foam, 2.5 x2.5 in (Home Health) 2 x Per Week/30 Days ary Discharge Instructions: Apply to wound bed as instructed Secondary Dressing: ABD Pad, 8x10 (Home Health) 2 x Per Week/30 Days Discharge Instructions: Apply over primary dressing as directed. Com pression Wrap: ThreePress (3 layer compression wrap) 2 x Per Week/30 Days Discharge Instructions: Use Kerlix as base layer 1. We put her Cyndia Bent under compression on this wound area which I am hopeful will be closed by next week 2. The patient comes from [facility], her daughter actually transports her. We will see her in 2 weeks Electronic Signature(s) Signed: 12/06/2020 7:53:49 AM By: [name] MD Entered By: [name] on 12/05/2020 13:22:13 -------------------------------------------------------------------------------- SuperBill Details Patient Name: Date of Service: [name], Christean Grief 12/05/2020 Medical Record Number: [id number] Patient Account Number: [id number] Date of Birth/Sex: Treating RN: 05/29/1940 (80 y.o. 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 Facility Procedures CPT4 Code: [id number] ( Description: Facility Use Only) [phone] - APPLY MULTLAY COMPRS LWR LT LEG ICD-10 Diagnosis Description L97.922 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 expo Modifier: sed Quantity: 1 Physician Procedures : CPT4 Code Description Modifier [id number] - WC PHYS LEVEL 3 - EST PT ICD-10 Diagnosis Description L97.922 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

## 2020-12-06 NOTE — Progress Notes (Signed)
Jamie [name], Jamie W. ([id number]) Visit Report for 12/05/2020 Arrival Information Details Patient Name: Date of Service: Jamie [name], [name] 12/05/2020 12:30 PM Medical Record Number: [id number] Patient Account Number: [id number] Date of Birth/Sex: Treating RN: 06/27/1940 (80 y.o. Jamie [name]) [name], Jamie Primary Care Jamie [name]: Jamie JacksonEggleston-Clark, Jamie Other Clinician: Referring Jamie [name]: Treating Jamie [name]/Extender: Jamie [name], Jamie [name], Jamie Weeks in Treatment: 22 Visit Information History Since Last Visit Added or deleted any medications: No Patient Arrived: Wheel Chair Any new allergies or adverse reactions: No Arrival Time: 12:34 Had a fall or experienced change in No Accompanied By: niece activities of daily living that may affect Transfer Assistance: None risk of falls: Patient Identification Verified: Yes Signs or symptoms of abuse/neglect since last visito No Secondary Verification Process Completed: Yes Hospitalized since last visit: No Patient Requires Transmission-Based Precautions: No Implantable device outside of the clinic excluding No Patient Has Alerts: Yes cellular tissue based products placed in the center Patient Alerts: L ABI: 1.06; R ABI: 1.01 since last visit: Has Dressing in Place as Prescribed: Yes Has Compression in Place as Prescribed: Yes Pain Present Now: No Electronic Signature(s) Signed: 12/05/2020 5:23:58 PM By: Antonieta IbaBarnhart, Jamie Entered By: Antonieta IbaBarnhart, Jamie on 12/05/2020 13:12:44 -------------------------------------------------------------------------------- Compression Therapy Details Patient Name: Date of Service: Jamie [name], [name] 12/05/2020 12:30 PM Medical Record Number: [id number] Patient Account Number: [id number] Date of Birth/Sex: Treating RN: 02/17/1941 (80 y.o. Jamie [name]) [name], Jamie Primary Care Yetzali Weld: Jamie JacksonEggleston-Clark, Jamie Other Clinician: Referring Lilienne Weins: Treating Theotis Gerdeman/Extender: Jamie [name],  Jamie [name], Jamie Weeks in Treatment: 22 Compression Therapy Performed for Wound Assessment: Wound #3 Left,Distal,Medial Lower Leg Performed By: Clinician Antonieta IbaBarnhart, Jodi, RN Compression Type: Three Layer Post Procedure Diagnosis Same as Pre-procedure Electronic Signature(s) Signed: 12/05/2020 5:23:58 PM By: Antonieta IbaBarnhart, Jamie Entered By: Antonieta IbaBarnhart, Jamie on 12/05/2020 12:56:44 -------------------------------------------------------------------------------- Encounter Discharge Information Details Patient Name: Date of Service: Jamie [name], Jamie GriefMA [name] 12/05/2020 12:30 PM Medical Record Number: [id number] Patient Account Number: [id number] Date of Birth/Sex: Treating RN: 07/30/1940 (80 y.o. Jamie [name]) [name], Jamie Primary Care Ramesses Crampton: Jamie JacksonEggleston-Clark, Jamie Other Clinician: Referring Gerhart Ruggieri: Treating Diva Lemberger/Extender: Jamie [name], Jamie [name], Jamie Weeks in Treatment: 22 Encounter Discharge Information Items Discharge Condition: Stable Ambulatory Status: Wheelchair Discharge Destination: Home Transportation: Private Auto Accompanied By: niece Schedule Follow-up Appointment: Yes Clinical Summary of Care: Provided on 12/05/2020 Form Type Recipient Paper Patient Patient Electronic Signature(s) Signed: 12/05/2020 5:23:58 PM By: Antonieta IbaBarnhart, Jamie Entered By: Antonieta IbaBarnhart, Jamie on 12/05/2020 13:13:24 -------------------------------------------------------------------------------- Lower Extremity Assessment Details Patient Name: Date of Service: Jamie [name], [name] 12/05/2020 12:30 PM Medical Record Number: [id number] Patient Account Number: [id number] Date of Birth/Sex: Treating RN: 11/14/1940 (80 y.o. Jamie [name]) [name], Jamie Primary Care Narcisa Ganesh: Jamie JacksonEggleston-Clark, Jamie Other Clinician: Referring Kely Dohn: Treating Joetta Delprado/Extender: Conard Novakobson, Jamie [name], Jamie Weeks in Treatment: 22 Edema Assessment Assessed: [name][Left: Yes] [Right: No] Edema: [Left:  [name]] [Right: o] Calf Left: Right: Point of Measurement: 29 cm From Medial Instep 34 cm Ankle Left: Right: Point of Measurement: 11 cm From Medial Instep 22 cm Vascular Assessment Pulses: Dorsalis Pedis Palpable: [Left:Yes] Electronic Signature(s) Signed: 12/05/2020 5:23:58 PM By: Antonieta IbaBarnhart, Jamie Entered By: Antonieta IbaBarnhart, Jamie on 12/05/2020 12:43:39 -------------------------------------------------------------------------------- Multi Wound Chart Details Patient Name: Date of Service: Jamie [name], Jamie GaveMA [name] 12/05/2020 12:30 PM Medical Record Number: [id number] Patient Account Number: [id number] Date of Birth/Sex: Treating RN: 08/17/1940 (80 y.o. Toniann FailF) Breedlove, Lauren Primary Care Teandre Hamre: Jamie JacksonEggleston-Clark, Jamie Other Clinician: Referring Mikhael Hendriks: Treating Jakaylah Schlafer/Extender: Jamie [name], Jamie [name], Jamie Weeks in Treatment: 22 Vital Signs Height(in): 63 Pulse(bpm): 80 Weight(lbs): 150 Blood Pressure(mmHg): 133/82 Body Mass Index(BMI):  27 Temperature(F): 98.8 Respiratory Rate(breaths/min): 18 Photos: [[name]:[name]] Left, Distal, Medial Lower Leg [name] [name] Wound Location: Gradually Appeared [name] [name] Wounding Event: Venous Leg Ulcer [name] [name] Primary Etiology: Sleep Apnea, Hypertension, Peripheral [name] [name] Comorbid History: Venous Disease, Osteoarthritis, Neuropathy 04/09/2019 [name] [name] Date Acquired: 22 [name] [name] Weeks of Treatment: Open [name] [name] Wound Status: 2x1.5x0.1 [name] [name] Measurements L x W x D (cm) 2.356 [name] [name] A (cm) : rea 0.236 [name] [name] Volume (cm) : 66.70% [name] [name] % Reduction in Area: 83.30% [name] [name] % Reduction in Volume: Full Thickness Without Exposed [name] [name] Classification: Support Structures Medium [name] [name] Exudate Amount: Serosanguineous [name] [name] Exudate Type: red, brown [name] [name] Exudate Color: Distinct, outline attached [name] [name] Wound Margin: Large (67-100%) [name] [name] Granulation Amount: Red, Pink [name] [name] Granulation Quality: None Present  (0%) [name] [name] Necrotic Amount: Fat Layer (Subcutaneous Tissue): Yes [name] [name] Exposed Structures: Fascia: No Tendon: No Muscle: No Joint: No Bone: No Medium (34-66%) [name] [name] Epithelialization: Compression Therapy [name] [name] Procedures Performed: Treatment Notes Wound #3 (Lower Leg) Wound Laterality: Left, Medial, Distal Cleanser Soap and Water Discharge Instruction: May shower and wash wound with dial antibacterial soap and water prior to dressing change. Wound Cleanser Discharge Instruction: Cleanse the wound with wound cleanser prior to applying a clean dressing using gauze sponges, not tissue or cotton balls. Peri-Wound Care Triamcinolone 15 (g) Discharge Instruction: In clinic only. Apply to red, irritated periwound Sween Lotion (Moisturizing lotion) Discharge Instruction: apply at home. Apply moisturizing lotion as directed Topical Primary Dressing Hydrofera Blue Ready Foam, 2.5 x2.5 in Discharge Instruction: Apply to wound bed as instructed Secondary Dressing ABD Pad, 8x10 Discharge Instruction: Apply over primary dressing as directed. Secured With Compression Wrap ThreePress (3 layer compression wrap) Discharge Instruction: Use Kerlix as base layer Compression Stockings Add-Ons Electronic Signature(s) Signed: 12/05/2020 5:24:19 PM By: Fonnie Mu RN Signed: 12/06/2020 7:53:49 AM By: [name] MD Entered By: [name] on 12/05/2020 13:18:05 -------------------------------------------------------------------------------- Multi-Disciplinary Care Plan Details Patient Name: Date of Service: Jamie [name], [place] W. 12/05/2020 12:30 PM Medical Record Number: [id number] Patient Account Number: [id number] Date of Birth/Sex: Treating RN: 11/26/1940 (80 y.o. Jamie Cluck Primary Care Anas Reister: Jamie [name] Other Clinician: Referring Anju Sereno: Treating Paxtyn Boyar/Extender: Jamie Budge in Treatment: 22 Active  Inactive Venous Leg Ulcer Nursing Diagnoses: Actual venous Insuffiency (use after diagnosis is confirmed) Goals: Patient will maintain optimal edema control Date Initiated: 06/30/2020 Target Resolution Date: 12/13/2020 Goal Status: Active Interventions: Assess peripheral edema status every visit. Compression as ordered Notes: Wound/Skin Impairment Nursing Diagnoses: Impaired tissue integrity Goals: Patient/caregiver will verbalize understanding of skin care regimen Date Initiated: 06/30/2020 Target Resolution Date: 12/13/2020 Goal Status: Active Ulcer/skin breakdown will have a volume reduction of 30% by week 4 Date Initiated: 06/30/2020 Date Inactivated: 08/07/2020 Target Resolution Date: 08/04/2020 Goal Status: Unmet Unmet Reason: non viable surface Ulcer/skin breakdown will have a volume reduction of 50% by week 8 Date Initiated: 10/10/2020 Target Resolution Date: 12/13/2020 Goal Status: Active Interventions: Assess patient/caregiver ability to obtain necessary supplies Assess patient/caregiver ability to perform ulcer/skin care regimen upon admission and as needed Assess ulceration(s) every visit Provide education on ulcer and skin care Treatment Activities: Skin care regimen initiated : 06/30/2020 Topical wound management initiated : 06/30/2020 Notes: Electronic Signature(s) Signed: 12/05/2020 5:23:58 PM By: [name] Entered By: [name] on 12/05/2020 12:33:46 -------------------------------------------------------------------------------- Pain Assessment Details Patient Name: Date of Service: Jamie Krill. 12/05/2020 12:30 PM Medical Record Number: [id number] Patient  Account Number: [id number] Date of Birth/Sex: Treating RN: 1941/01/11 (80 y.o. Jamie Cluck Primary Care Licet Dunphy: Jamie [name] Other Clinician: Referring Sevan Mcbroom: Treating Undra Harriman/Extender: Jamie Budge in Treatment: 22 Active  Problems Location of Pain Severity and Description of Pain Patient Has Paino No Site Locations Pain Management and Medication Current Pain Management: Electronic Signature(s) Signed: 12/05/2020 5:23:58 PM By: [name] Entered By: [name] on 12/05/2020 12:39:43 -------------------------------------------------------------------------------- Patient/Caregiver Education Details Patient Name: Date of Service: Jamie Krill 8/30/2022andnbsp12:30 PM Medical Record Number: [id number] Patient Account Number: [id number] Date of Birth/Gender: Treating RN: [birth year] (80 y.o. Jamie Cluck Primary Care Physician: Jamie [name] Other Clinician: Referring Physician: Treating Physician/Extender: Jamie Budge in Treatment: 22 Education Assessment Education Provided To: Patient Education Topics Provided Venous: Methods: Explain/Verbal, Printed Responses: State content correctly Wound/Skin Impairment: Methods: Explain/Verbal, Printed Responses: State content correctly Electronic Signature(s) Signed: 12/05/2020 5:23:58 PM By: [name] Entered By: [name] on 12/05/2020 12:34:07 -------------------------------------------------------------------------------- Wound Assessment Details Patient Name: Date of Service: Jamie Krill. 12/05/2020 12:30 PM Medical Record Number: [id number] Patient Account Number: [id number] Date of Birth/Sex: Treating RN: 1940-08-25 (80 y.o. Jamie Cluck Primary Care Samyiah Halvorsen: Jamie [name] Other Clinician: Referring Bennet Kujawa: Treating Julie-Anne Torain/Extender: [name] Weeks in Treatment: 22 Wound Status Wound Number: 3 Primary Venous Leg Ulcer Etiology: Wound Location: Left, Distal, Medial Lower Leg Wound Open Wounding Event: Gradually Appeared Status: Date Acquired: 04/09/2019 Comorbid Sleep Apnea, Hypertension, Peripheral  Venous Disease, Weeks Of Treatment: 22 History: Osteoarthritis, Neuropathy Clustered Wound: No Photos Wound Measurements Length: (cm) 2 Width: (cm) 1.5 Depth: (cm) 0.1 Area: (cm) 2.356 Volume: (cm) 0.236 % Reduction in Area: 66.7% % Reduction in Volume: 83.3% Epithelialization: Medium (34-66%) Tunneling: No Undermining: No Wound Description Classification: Full Thickness Without Exposed Support Structures Wound Margin: Distinct, outline attached Exudate Amount: Medium Exudate Type: Serosanguineous Exudate Color: red, brown Foul Odor After Cleansing: No Slough/Fibrino No Wound Bed Granulation Amount: Large (67-100%) Exposed Structure Granulation Quality: Red, Pink Fascia Exposed: No Necrotic Amount: None Present (0%) Fat Layer (Subcutaneous Tissue) Exposed: Yes Tendon Exposed: No Muscle Exposed: No Joint Exposed: No Bone Exposed: No Treatment Notes Wound #3 (Lower Leg) Wound Laterality: Left, Medial, Distal Cleanser Soap and Water Discharge Instruction: May shower and wash wound with dial antibacterial soap and water prior to dressing change. Wound Cleanser Discharge Instruction: Cleanse the wound with wound cleanser prior to applying a clean dressing using gauze sponges, not tissue or cotton balls. Peri-Wound Care Triamcinolone 15 (g) Discharge Instruction: In clinic only. Apply to red, irritated periwound Sween Lotion (Moisturizing lotion) Discharge Instruction: apply at home. Apply moisturizing lotion as directed Topical Primary Dressing Hydrofera Blue Ready Foam, 2.5 x2.5 in Discharge Instruction: Apply to wound bed as instructed Secondary Dressing ABD Pad, 8x10 Discharge Instruction: Apply over primary dressing as directed. Secured With Compression Wrap ThreePress (3 layer compression wrap) Discharge Instruction: Use Kerlix as base layer Compression Stockings Add-Ons Electronic Signature(s) Signed: 12/05/2020 5:23:58 PM By: [name] Entered By:  [name] on 12/05/2020 12:56:23 -------------------------------------------------------------------------------- Vitals Details Patient Name: Date of Service: Jamie [name], Jamie [name] 12/05/2020 12:30 PM Medical Record Number: [id number] Patient Account Number: [id number] Date of Birth/Sex: Treating RN: 1940-05-10 (80 y.o. Jamie Cluck Primary Care Eulamae Greenstein: Jamie [name] Other Clinician: Referring Early Steel: Treating Ardian Haberland/Extender: Jamie Budge in Treatment: 22 Vital Signs Time Taken: 12:39 Temperature (F): 98.8 Height (in): 63 Pulse (bpm): 80 Weight (lbs): 150 Respiratory Rate (breaths/min): 18  Body Mass Index (BMI): 26.6 Blood Pressure (mmHg): 133/82 Reference Range: 80 - 120 mg / dl Electronic Signature(s) Signed: 12/05/2020 5:23:58 PM By: [name] Entered By: [name] on 12/05/2020 12:39:19

## 2020-12-19 ENCOUNTER — Other Ambulatory Visit: Payer: Self-pay

## 2020-12-19 ENCOUNTER — Encounter (HOSPITAL_BASED_OUTPATIENT_CLINIC_OR_DEPARTMENT_OTHER): Payer: Medicare Other | Attending: Internal Medicine | Admitting: Internal Medicine

## 2020-12-19 DIAGNOSIS — G629 Polyneuropathy, unspecified: Secondary | ICD-10-CM | POA: Diagnosis not present

## 2020-12-19 DIAGNOSIS — M1990 Unspecified osteoarthritis, unspecified site: Secondary | ICD-10-CM | POA: Diagnosis not present

## 2020-12-19 DIAGNOSIS — L97922 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: Secondary | ICD-10-CM | POA: Diagnosis present

## 2020-12-19 NOTE — Progress Notes (Signed)
MAKINZY, CLEERE ([id number]) Visit Report for 11/07/2020 Arrival Information Details Patient Name: Date of Service: Jamie [name], [name]. 11/07/2020 10:45 A M Medical Record Number: [id number] Patient Account Number: [id number] Date of Birth/Sex: Treating RN: 10-29-1940 (80 y.o. [name] Primary Care Elajah Kunsman: [name] Other Clinician: Referring Rand Etchison: Treating Linda Biehn/Extender: Eugenie Birks in Treatment: 18 Visit Information History Since Last Visit Added or deleted any medications: No Patient Arrived: Wheel Chair Any new allergies or adverse reactions: No Arrival Time: 10:55 Had a fall or experienced change in No Accompanied By: daughter activities of daily living that may affect Transfer Assistance: None risk of falls: Patient Identification Verified: Yes Signs or symptoms of abuse/neglect since last visito No Secondary Verification Process Completed: Yes Hospitalized since last visit: No Patient Requires Transmission-Based Precautions: No Implantable device outside of the clinic excluding No Patient Has Alerts: Yes cellular tissue based products placed in the center Patient Alerts: L ABI: 1.06; R ABI: 1.01 since last visit: Has Dressing in Place as Prescribed: Yes Has Compression in Place as Prescribed: Yes Pain Present Now: No Electronic Signature(s) Signed: 11/07/2020 11:55:49 AM By: Shawn Stall Entered By: Shawn Stall on 11/07/2020 11:02:15 -------------------------------------------------------------------------------- Compression Therapy Details Patient Name: Date of Service: Jamie Krill. 11/07/2020 10:45 A M Medical Record Number: [id number] Patient Account Number: [id number] Date of Birth/Sex: Treating RN: 1940-08-25 (80 y.o. [name] Primary Care Lurleen Soltero: [name] Other Clinician: Referring Darby Shadwick: Treating Jamie [name]/Extender: Kathrynn Running Weeks in Treatment: 18 Compression Therapy Performed for Wound Assessment: Wound #3 Left,Distal,Medial Lower Leg Performed By: Clinician Fonnie Mu, RN Compression Type: Three Layer Post Procedure Diagnosis Same as Pre-procedure Electronic Signature(s) Signed: 12/19/2020 8:00:33 AM By: Fonnie Mu RN Entered By: Fonnie Mu on 11/07/2020 11:36:02 -------------------------------------------------------------------------------- Encounter Discharge Information Details Patient Name: Date of Service: Jamie [name], Jamie [name] 11/07/2020 10:45 A M Medical Record Number: [id number] Patient Account Number: [id number] Date of Birth/Sex: Treating RN: November 28, 1940 (80 y.o. Jamie [name] Primary Care Maragret Vanacker: [name] Other Clinician: Referring Jamie [name]: Treating Jaionna Weisse/Extender: Eugenie Birks in Treatment: 18 Encounter Discharge Information Items Discharge Condition: Stable Ambulatory Status: Wheelchair Discharge Destination: Home Transportation: Private Auto Accompanied By: Daughter Schedule Follow-up Appointment: Yes Clinical Summary of Care: Provided on 11/07/2020 Form Type Recipient Paper Patient Patient Electronic Signature(s) Signed: 11/07/2020 11:50:04 AM By: Antonieta [name] Entered By: Antonieta [name] on 11/07/2020 11:50:04 -------------------------------------------------------------------------------- Lower Extremity Assessment Details Patient Name: Date of Service: Jamie Krill. 11/07/2020 10:45 A M Medical Record Number: [id number] Patient Account Number: [id number] Date of Birth/Sex: Treating RN: [birth year] (80 y.o. [name] Primary Care Alyza Artiaga: [name] Other Clinician: Referring Devlin Mcveigh: Treating Myrka Sylva/Extender: Kathrynn Running Weeks in Treatment: 18 Edema Assessment Assessed: [Left: Yes] [Right:  No] Edema: [Left: N] [Right: o] Calf Left: Right: Point of Measurement: 29 cm From Medial Instep 33.5 cm Ankle Left: Right: Point of Measurement: 11 cm From Medial Instep 23.5 cm Vascular Assessment Pulses: Dorsalis Pedis Palpable: [Left:Yes] Electronic Signature(s) Signed: 11/07/2020 11:55:49 AM By: Shawn Stall Entered By: Shawn Stall on 11/07/2020 11:03:25 -------------------------------------------------------------------------------- Multi Wound Chart Details Patient Name: Date of Service: Jamie [name], Jamie [name] 11/07/2020 10:45 A M Medical Record Number: [id number] Patient Account Number: [id number] Date of Birth/Sex: Treating RN: 01-Feb-1941 (80 y.o. [name] Primary Care Jamie [name]: [name] Other Clinician: Referring Jamie [name]: Treating Jamie [name]/Extender: Kathrynn Running Weeks in Treatment: 18 Vital Signs Height(in): 63 Pulse(bpm): 73 Weight(lbs): 150  Blood Pressure(mmHg): 144/86 Body Mass Index(BMI): 27 Temperature(F): 98.6 Respiratory Rate(breaths/min): 20 Photos: [N/A:N/A] Left, Distal, Medial Lower Leg N/A N/A Wound Location: Gradually Appeared N/A N/A Wounding Event: Venous Leg Ulcer N/A N/A Primary Etiology: Sleep Apnea, Hypertension, Peripheral N/A N/A Comorbid History: Venous Disease, Osteoarthritis, Neuropathy 04/09/2019 N/A N/A Date Acquired: 70 N/A N/A Weeks of Treatment: Open N/A N/A Wound Status: 5.8x1.8x0.1 N/A N/A Measurements L x W x D (cm) 8.2 N/A N/A A (cm) : rea 0.82 N/A N/A Volume (cm) : -15.80% N/A N/A % Reduction in A rea: 42.10% N/A N/A % Reduction in Volume: Full Thickness Without Exposed N/A N/A Classification: Support Structures Medium N/A N/A Exudate A mount: Serosanguineous N/A N/A Exudate Type: red, brown N/A N/A Exudate Color: Flat and Intact N/A N/A Wound Margin: Large (67-100%) N/A N/A Granulation A mount: Red, Pink N/A N/A Granulation  Quality: Small (1-33%) N/A N/A Necrotic A mount: Fat Layer (Subcutaneous Tissue): Yes N/A N/A Exposed Structures: Fascia: No Tendon: No Muscle: No Joint: No Bone: No Large (67-100%) N/A N/A Epithelialization: Debridement - Excisional N/A N/A Debridement: Pre-procedure Verification/Time Out 11:34 N/A N/A Taken: Lidocaine N/A N/A Pain Control: Callus, Subcutaneous, Slough N/A N/A Tissue Debrided: Skin/Subcutaneous Tissue N/A N/A Level: 10.44 N/A N/A Debridement A (sq cm): rea Curette N/A N/A Instrument: Minimum N/A N/A Bleeding: Pressure N/A N/A Hemostasis A chieved: 0 N/A N/A Procedural Pain: 0 N/A N/A Post Procedural Pain: Procedure was tolerated well N/A N/A Debridement Treatment Response: 5.8x1.8x0.1 N/A N/A Post Debridement Measurements L x W x D (cm) 0.82 N/A N/A Post Debridement Volume: (cm) Cellular or Tissue Based Product N/A N/A Procedures Performed: Compression Therapy Debridement Treatment Notes Wound #3 (Lower Leg) Wound Laterality: Left, Medial, Distal Cleanser Soap and Water Discharge Instruction: May shower and wash wound with dial antibacterial soap and water prior to dressing change. Wound Cleanser Discharge Instruction: Cleanse the wound with wound cleanser prior to applying a clean dressing using gauze sponges, not tissue or cotton balls. Peri-Wound Care Triamcinolone 15 (g) Discharge Instruction: In clinic only. Apply to red, irritated periwound Sween Lotion (Moisturizing lotion) Discharge Instruction: apply at home. Apply moisturizing lotion as directed Topical Primary Dressing Secondary Dressing Woven Gauze Sponge, Non-Sterile 4x4 in Discharge Instruction: Apply over primary dressing as directed. ABD Pad, 8x10 Discharge Instruction: Apply over primary dressing as directed. ADAPTIC TOUCH 3x4.25 in Discharge Instruction: Apply over primary dressing as directed. Secured With Compression Wrap ThreePress (3 layer compression  wrap) Discharge Instruction: Apply three layer compression as directed. Compression Stockings Add-Ons Electronic Signature(s) Signed: 11/07/2020 12:12:30 PM By: Geralyn Corwin DO Signed: 12/19/2020 8:00:33 AM By: Fonnie Mu RN Entered By: Geralyn Corwin on 11/07/2020 12:07:31 -------------------------------------------------------------------------------- Multi-Disciplinary Care Plan Details Patient Name: Date of Service: Jamie [name], Jamie [name] 11/07/2020 10:45 A M Medical Record Number: [id number] Patient Account Number: [id number] Date of Birth/Sex: Treating RN: 28-Sep-1940 (80 y.o. [name], Lauren Primary Care Mishawn Hemann: [name] Other Clinician: Referring Aubrei Bouchie: Treating Kemp Gomes/Extender: Kathrynn Running Weeks in Treatment: 18 Active Inactive Venous Leg Ulcer Nursing Diagnoses: Actual venous Insuffiency (use after diagnosis is confirmed) Goals: Patient will maintain optimal edema control Date Initiated: 06/30/2020 Target Resolution Date: 11/28/2020 Goal Status: Active Interventions: Assess peripheral edema status every visit. Compression as ordered Notes: Wound/Skin Impairment Nursing Diagnoses: Impaired tissue integrity Goals: Patient/caregiver will verbalize understanding of skin care regimen Date Initiated: 06/30/2020 Target Resolution Date: 12/09/2020 Goal Status: Active Ulcer/skin breakdown will have a volume reduction of 30% by week 4 Date Initiated: 06/30/2020 Date Inactivated: 08/07/2020  Target Resolution Date: 08/04/2020 Goal Status: Unmet Unmet Reason: non viable surface Ulcer/skin breakdown will have a volume reduction of 50% by week 8 Date Initiated: 10/10/2020 Target Resolution Date: 12/09/2020 Goal Status: Active Interventions: Assess patient/caregiver ability to obtain necessary supplies Assess patient/caregiver ability to perform ulcer/skin care regimen upon admission and as needed Assess  ulceration(s) every visit Provide education on ulcer and skin care Treatment Activities: Skin care regimen initiated : 06/30/2020 Topical wound management initiated : 06/30/2020 Notes: Electronic Signature(s) Signed: 12/19/2020 8:00:33 AM By: Fonnie Mu RN Entered By: Fonnie Mu on 11/07/2020 11:34:20 -------------------------------------------------------------------------------- Pain Assessment Details Patient Name: Date of Service: Gevena Mart W. 11/07/2020 10:45 A M Medical Record Number: [id number] Patient Account Number: [id number] Date of Birth/Sex: Treating RN: Jul 11, 1940 (80 y.o. [name] Primary Care Keily Lepp: [name] Other Clinician: Referring Geniya Fulgham: Treating Rechel Delosreyes/Extender: Kathrynn Running Weeks in Treatment: 18 Active Problems Location of Pain Severity and Description of Pain Patient Has Paino No Site Locations Rate the pain. Rate the pain. Current Pain Level: 0 Pain Management and Medication Current Pain Management: Medication: No Cold Application: No Rest: No Massage: No Activity: No T.E.N.S.: No Heat Application: No Leg drop or elevation: No Is the Current Pain Management Adequate: Adequate How does your wound impact your activities of daily livingo Sleep: No Bathing: No Appetite: No Relationship With Others: No Bladder Continence: No Emotions: No Bowel Continence: No Work: No Toileting: No Drive: No Dressing: No Hobbies: No Electronic Signature(s) Signed: 11/07/2020 11:55:49 AM By: Shawn Stall Entered By: Shawn Stall on 11/07/2020 11:03:15 -------------------------------------------------------------------------------- Patient/Caregiver Education Details Patient Name: Date of Service: Jamie Krill 8/2/2022andnbsp10:45 A M Medical Record Number: [id number] Patient Account Number: [id number] Date of Birth/Gender: Treating RN: 04/16/1940 (80 y.o. [name] Primary Care Physician: [name] Other Clinician: Referring Physician: Treating Physician/Extender: Eugenie Birks in Treatment: 66 Education Assessment Education Provided To: Patient Education Topics Provided Wound/Skin Impairment: Methods: Explain/Verbal Responses: State content correctly [employer]) Signed: 12/19/2020 8:00:33 AM By: Fonnie Mu RN Entered By: Fonnie Mu on 11/07/2020 11:34:39 -------------------------------------------------------------------------------- Wound Assessment Details Patient Name: Date of Service: Jamie Krill. 11/07/2020 10:45 A M Medical Record Number: [id number] Patient Account Number: [id number] Date of Birth/Sex: Treating RN: [birth year] (81 y.o. Debara Pickett, Yvonne Kendall Primary Care Sumayah Bearse: [name] Other Clinician: Referring Dafina Suk: Treating Krystol Rocco/Extender: Kathrynn Running Weeks in Treatment: 18 Wound Status Wound Number: 3 Primary Venous Leg Ulcer Etiology: Wound Location: Left, Distal, Medial Lower Leg Wound Open Wounding Event: Gradually Appeared Status: Date Acquired: 04/09/2019 Comorbid Sleep Apnea, Hypertension, Peripheral Venous Disease, Weeks Of Treatment: 18 History: Osteoarthritis, Neuropathy Clustered Wound: No Photos Wound Measurements Length: (cm) 5.8 Width: (cm) 1.8 Depth: (cm) 0.1 Area: (cm) 8.2 Volume: (cm) 0.82 % Reduction in Area: -15.8% % Reduction in Volume: 42.1% Epithelialization: Large (67-100%) Tunneling: No Undermining: No Wound Description Classification: Full Thickness Without Exposed Support Structures Wound Margin: Flat and Intact Exudate Amount: Medium Exudate Type: Serosanguineous Exudate Color: red, brown Foul Odor After Cleansing: No Slough/Fibrino Yes Wound Bed Granulation Amount: Large (67-100%) Exposed Structure Granulation Quality: Red,  Pink Fascia Exposed: No Necrotic Amount: Small (1-33%) Fat Layer (Subcutaneous Tissue) Exposed: Yes Necrotic Quality: Adherent Slough Tendon Exposed: No Muscle Exposed: No Joint Exposed: No Bone Exposed: No Electronic Signature(s) Signed: 11/07/2020 11:55:49 AM By: Shawn Stall Signed: 11/21/2020 11:33:27 AM By: Karl Ito Entered By: Karl Ito on 11/07/2020 11:01:35 -------------------------------------------------------------------------------- Vitals Details Patient Name: Date of Service: Lilyan Punt  N, [name] [name] 11/07/2020 10:45 A M Medical Record Number: [id number] Patient Account Number: [id number] Date of Birth/Sex: Treating RN: 11-05-1940 (80 y.o. [name] Primary Care Wiley Flicker: [name] Other Clinician: Referring Geanine Vandekamp: Treating Mariette Cowley/Extender: Kathrynn Running Weeks in Treatment: 18 Vital Signs Time Taken: 10:55 Temperature (F): 98.6 Height (in): 63 Pulse (bpm): 73 Weight (lbs): 150 Respiratory Rate (breaths/min): 20 Body Mass Index (BMI): 26.6 Blood Pressure (mmHg): 144/86 Reference Range: 80 - 120 mg / dl Electronic Signature(s) Signed: 11/07/2020 11:55:49 AM By: Shawn Stall Entered By: Shawn Stall on 11/07/2020 11:03:06

## 2020-12-19 NOTE — Progress Notes (Signed)
[name], [name] ([id number]) Visit Report for 11/07/2020 Chief Complaint Document Details Patient Name: Date of Service: Jamie [name], [name]. 11/07/2020 10:45 A M Medical Record Number: [id number] Patient Account Number: [id number] Date of Birth/Sex: Treating RN: 1940-12-08 (80 y.o. Jamie [name], Lauren Primary Care Provider: [name] Other Clinician: Referring Provider: Treating Provider/Extender: Eugenie Birks in Treatment: 18 Information Obtained from: Patient Chief Complaint 06/30/2020; patient is here for review of wounds centered on her left medial lower leg and ankle Electronic Signature(s) Signed: 11/07/2020 12:12:30 PM By: Geralyn Corwin DO Entered By: Geralyn Corwin on 11/07/2020 12:07:41 -------------------------------------------------------------------------------- Cellular or Tissue Based Product Details Patient Name: Date of Service: [name] 11/07/2020 10:45 A M Medical Record Number: [id number] Patient Account Number: [id number] Date of Birth/Sex: Treating RN: 1940-10-28 (80 y.o. Jamie [name], Lauren Primary Care Provider: [name] Other Clinician: Referring Provider: Treating Provider/Extender: Eugenie Birks in Treatment: 18 Cellular or Tissue Based Product Type Wound #3 Left,Distal,Medial Lower Leg Applied to: Performed By: Physician Geralyn Corwin, DO Cellular or Tissue Based Product Type: Apligraf Level of Consciousness (Pre-procedure): Awake and Alert Pre-procedure Verification/Time Out Yes - 11:15 Taken: Location: genitalia / hands / feet / multiple digits Wound Size (sq cm): 10.44 Product Size (sq cm): 44 Waste Size (sq cm): 12 Amount of Product Applied (sq cm): 32 Instrument Used: Blade, Forceps, Scissors Lot #: [id number] Expiration Date: 11/16/2020 Fenestrated: Yes Instrument: Blade Reconstituted: Yes Solution Type:  NS Solution Amount: 78mL Lot #: [id number] Solution Expiration Date: 12/07/2021 Secured: Yes Secured With: Steri-Strips Dressing Applied: Yes Primary Dressing: adaptic Procedural Pain: 0 Post Procedural Pain: 0 Response to Treatment: Procedure was tolerated well Level of Consciousness (Post- Awake and Alert procedure): Post Procedure Diagnosis Same as Pre-procedure Electronic Signature(s) Signed: 11/07/2020 12:12:30 PM By: Geralyn Corwin DO Signed: 12/19/2020 8:00:33 AM By: Fonnie Mu RN Entered By: Fonnie Mu on 11/07/2020 11:54:08 -------------------------------------------------------------------------------- Debridement Details Patient Name: Date of Service: Jamie [name], [name] 11/07/2020 10:45 A M Medical Record Number: [id number] Patient Account Number: [id number] Date of Birth/Sex: Treating RN: 1940/06/16 (80 y.o. Jamie [name], Lauren Primary Care Provider: [name] Other Clinician: Referring Provider: Treating Provider/Extender: Kathrynn Running Weeks in Treatment: 18 Debridement Performed for Assessment: Wound #3 Left,Distal,Medial Lower Leg Performed By: Physician Geralyn Corwin, DO Debridement Type: Debridement Severity of Tissue Pre Debridement: Fat layer exposed Level of Consciousness (Pre-procedure): Awake and Alert Pre-procedure Verification/Time Out Yes - 11:34 Taken: Start Time: 11:34 Pain Control: Lidocaine T Area Debrided (L x W): otal 5.8 (cm) x 1.8 (cm) = 10.44 (cm) Tissue and other material debrided: Viable, Non-Viable, Callus, Slough, Subcutaneous, Skin: Dermis , Skin: Epidermis, Slough Level: Skin/Subcutaneous Tissue Debridement Description: Excisional Instrument: Curette Bleeding: Minimum Hemostasis Achieved: Pressure End Time: 11:34 Procedural Pain: 0 Post Procedural Pain: 0 Response to Treatment: Procedure was tolerated well Level of Consciousness (Post- Awake and  Alert procedure): Post Debridement Measurements of Total Wound Length: (cm) 5.8 Width: (cm) 1.8 Depth: (cm) 0.1 Volume: (cm) 0.82 Character of Wound/Ulcer Post Debridement: Improved Severity of Tissue Post Debridement: Fat layer exposed Post Procedure Diagnosis Same as Pre-procedure Electronic Signature(s) Signed: 11/07/2020 12:12:30 PM By: Geralyn Corwin DO Signed: 12/19/2020 8:00:33 AM By: Fonnie Mu RN Entered By: Fonnie Mu on 11/07/2020 11:55:34 -------------------------------------------------------------------------------- HPI Details Patient Name: Date of Service: Jamie Brink, [place] [name] 11/07/2020 10:45 A M Medical Record Number: [id number] Patient Account Number: [id number] Date of Birth/Sex: Treating RN: 10-09-1940 (80 y.o. Jamie [name], Lauren Primary  Care Provider: [name] Other Clinician: Referring Provider: Treating Provider/Extender: Kathrynn Running Weeks in Treatment: 18 History of Present Illness HPI Description: ADMISSION 06/30/2020 [name] is an 80 year old woman who lives in [place]. She is here with her niece for review of wounds on the left medial lower leg and ankle. These have apparently been present for over a year and she followed with Dr. Olegario Messier at the [facility] in [place] for quite a period of time although it looks as though there was a initial consult wound from Dr. Marcha Solders on February 18 presumably there was therefore hiatus. At that point the wounds were described as being there for 3 months. She also tells me she was at the wound care center in [place] for a period of time with this. 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 She had venous studies that were negative for DVT ABIs on the right were 1.01 on the left 1.06. She has . had previous applications of puraply, compression which she does not tolerate very well. She has had  several rounds of oral antibiotic therapy. She complains of unrelenting pain and she is seeing Dr. Reece Agar V of pain management apparently was on oxycodone but that did not help. She also had a skin biopsy done by Dr. Olegario Messier although we do not have that result. She does not appear to have an arterial issue. I am not completely clear what she has been putting on the wounds lately. 3/31; this patient has a particularly nasty set of wounds on the left medial ankle in the middle of what looks to be hemosiderin deposition secondary to chronic venous insufficiency. She has a lot of pain followed by pain management. Dr. Marcha Solders apparently did a biopsy of something on the left leg last fall what I would like to get this result. She has a history of MRSA treatment. I do not believe she had reflux studies but she did have DVT rule out studies. Previous ABIs have not suggested arterial insufficiency 4/7; difficult wounds on the left medial ankle probably chronic venous insufficiency. With considerable effort on behalf of our case manager we were able to finally to speak to somebody at the hospital in [place] who indicated that no biopsy of this area have been done even though the patient describes this in some detail and is even able to point out where she thinks the biopsy was done. We have been using Sorbact. The PCR culture I did showed polymicrobial identification with Pseudomonas, staph aureus, Peptostreptococcus. All of this and low titers. Resistance genes identified were MRSA, staph virulence gene and tetracycline. We are going to send this to [facility] for a topical antibiotic which is something that we have had good success with recently in large venous ulcers with a lot of purulent drainage. There would not be an easy oral alternative here possibly line escalated and ciprofloxacin if we need to use systemic antibiotic 4/15; difficult area on the left medial ankle. Most likely chronic venous insufficiency. I  think she will probably need venous reflux study I think she had DVT rule outs but not venous reflux studies. We have not yet obtained the topical antibiotics. She has home health changing the dressing we have been using Sorbact for adherent fibrinous debris on the surface. Very difficult to remove 4/22; patient presents for 1 week follow-up. She has been using sore back under compression wraps and these are changed 3 times a week with  home health. She also had [facility] antibiotics sent to her house and brought them in today. She has no complaints or issues today. 5/2; patient is here for follow-up. She has been using Sorbact under compression. Very painful wound. She has been using Keystone antibiotics. Not much improvement although the more medial part of the wound has cleaned up nicely and the larger part of the wound about 50% slough covered. Part of the issue here is that she had stays at both [facility] wound care center, [facility] wound care center and now [place]. Not sure if she has had venous reflux studies. As far as we are able to tell she did not have a biopsy. My notes state that she did not have venous reflux studies just DVT rule outs. 5/16; patient goes for venous reflux studies this afternoon. She says that wound was biopsied which sounds like punch biopsies by Dr. Lynden Ang we do not have these results. We are using Sorbact. Very difficult wound to debride 5/23; patient presents for 1 week follow-up. She reports tolerating the wraps well with sorbact underneath. She had ABIs and venous reflux studies done. She has no issues or complaints today. She denies acute signs of infection. 6/6; I have reviewed the patient's vascular studies. Wounds are on the left medial and posterior calf. She had significant reflux in the greater saphenous vein in the in the mid thigh, distal thigh knee and the small saphenous vein in the popliteal fossa. The vein diameters do not look too impressive though. She  is going to see the vascular surgeon on Wednesday. She also had venous reflux in the right common femoral vein. She did not have any evidence of a DVT or SVT I am wondering whether there is an ablation procedure that would benefit her in the greater saphenous vein on the left. . She tells [place] that home health put the dressing on too tight and she took off 1 layer. The swelling in her left leg is a little worse as a result of this. Not much change in the wound measurements so the surface of the wound looks better Her arterial studies showed an ABI on the right of 1.14 with a triphasic waveform and a great toe pressure of 0.89. On the left her ABIs were noncompressible at 1.34 but with triphasic waveforms and a TBI of 0.97. Her greater toe pressure was 122. There was no evidence of significant bilateral arterial disease 6/20 patient went to see Dr. Durwin Nora. He did not think she had significant arterial disease. In terms of her venous duplex on the right side there was no evidence of a DVT or SVT there was deep venous reflux involving the common femoral vein no superficial vein reflux on the left side there was no DVT or SVT there was no deep vein graft reflux there was some reflux in the greater saphenous vein from the mid thigh to the knee but the vein here was not dilated. He thought these were venous wounds he prescribed a compression pump but I am not sure who we ordered it from The patient has been approved for Apligraf. Still using silver collagen this week 7/5; Apligraf #1 7/19 Apligraf #2. Decent improvement in the condition of the wound bed. Epithelialization distal 8/2 Apligraf #3. No issues or complaints. Denies signs of infection. Electronic Signature(s) Signed: 11/07/2020 12:12:30 PM By: Geralyn Corwin DO Entered By: Geralyn Corwin on 11/07/2020 12:08:05 -------------------------------------------------------------------------------- Physical Exam Details Patient Name: Date of  Service: Jamie Brink, MA RGUERITE  W. 11/07/2020 10:45 A M Medical Record Number: [id number] Patient Account Number: [id number] Date of Birth/Sex: Treating RN: 08-Jun-1940 (80 y.o. [name] Primary Care Provider: [name] Other Clinician: Referring Provider: Treating Provider/Extender: Kathrynn Running Weeks in Treatment: 18 Constitutional respirations regular, non-labored and within target range for patient.. Cardiovascular 2+ dorsalis pedis/posterior tibialis pulses. Psychiatric pleasant and cooperative. Notes Left lower extremity: Open wound with granulation tissue and nonviable tissue present. No signs of infection. Electronic Signature(s) Signed: 11/07/2020 12:12:30 PM By: Geralyn Corwin DO Entered By: Geralyn Corwin on 11/07/2020 12:08:58 -------------------------------------------------------------------------------- Physician Orders Details Patient Name: Date of Service: Jamie [name], [name] 11/07/2020 10:45 A M Medical Record Number: [id number] Patient Account Number: [id number] Date of Birth/Sex: Treating RN: 1940-07-30 (80 y.o. 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 - with Dr. Leanord Hawking Return A Cellular or Tissue Based Products Cellular or Tissue Based Product Type: - Apligraf #3 11/07/2020 Bathing/ Shower/ Hygiene May shower with protection but do not get wound dressing(s) wet. Edema Control - Lymphedema / SCD / Other Elevate legs to the level of  the heart or above for 30 minutes daily and/or when sitting, a frequency of: - throughout the day 3-4 times a day. Avoid standing for long periods of time. Exercise regularly Additional Orders / Instructions Follow Nutritious Diet Home Health New wound care orders this week; continue Home Health for wound care. May utilize formulary equivalent dressing for wound treatment orders unless otherwise specified. - Change wrap and outer dressing, Do NOT remove skin substitute. Other Home Health Orders/Instructions: - [facility] fax #[phone] Wound Treatment Wound #3 - Lower Leg Wound Laterality: Left, Medial, Distal Cleanser: Soap and Water (Home Health) 2 x Per Week/30 Days Discharge Instructions: May shower and wash wound with dial antibacterial soap and water prior to dressing change. Cleanser: Wound Cleanser (Home Health) 2 x Per Week/30 Days Discharge Instructions: Cleanse the wound with wound cleanser prior to applying a clean dressing using gauze sponges, not tissue or cotton balls. Peri-Wound Care: Triamcinolone 15 (g) 2 x Per Week/30 Days Discharge Instructions: In clinic only. Apply to red, irritated periwound Peri-Wound Care: Sween Lotion (Moisturizing lotion) (Home Health) 2 x Per Week/30 Days Discharge Instructions: apply at home. Apply moisturizing lotion as directed Secondary Dressing: Woven Gauze Sponge, Non-Sterile 4x4 in (Home Health) 2 x Per Week/30 Days Discharge Instructions: Apply over primary dressing as directed. Secondary Dressing: ABD Pad, 8x10 (Home Health) 2 x Per Week/30 Days Discharge Instructions: Apply over primary dressing as directed. Secondary Dressing: ADAPTIC TOUCH 3x4.25 in 2 x Per Week/30 Days Discharge Instructions: Apply over primary dressing as directed. Compression Wrap: ThreePress (3 layer compression wrap) 2 x Per Week/30 Days Discharge Instructions: Apply three layer compression as directed. Electronic Signature(s) Signed:  11/07/2020 12:12:30 PM By: Geralyn Corwin DO Entered By: Geralyn Corwin on 11/07/2020 12:09:14 -------------------------------------------------------------------------------- Problem List Details Patient Name: Date of Service: Jamie [name], [name] 11/07/2020 10:45 A M Medical Record Number: [id number] Patient Account Number: [id number] Date of Birth/Sex: Treating RN: [birth year] (80 y.o. 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 11/07/2020 10:45 A M Medical Record Number: [id number] Patient Account Number: [id number] Date of Birth/Sex: Treating RN: Aug 11, 1940 (80 y.o. Jamie [name], Lauren Primary Care Provider: [name] Other Clinician: Referring Provider: Treating Provider/Extender: Eugenie Birks in Treatment: 101 Subjective Chief Complaint Information obtained from Patient 06/30/2020; patient is here for review of wounds centered on her left medial lower leg and ankle History of Present Illness (HPI) ADMISSION 06/30/2020 Mrs. Zuk is an 80 year old woman who lives in [place]. She  is here with her niece for review of wounds on the left medial lower leg and ankle. These have apparently been present for over a year and she followed with Dr. Olegario Messier at the [facility] in [place] for quite a period of time although it looks as though there was a initial consult wound from Dr. Marcha Solders on February 18 presumably there was therefore hiatus. At that point the wounds were described as being there for 3 months. She also tells me she was at the wound care center in [place] for a period of time with this. 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 She had venous studies that were negative for DVT ABIs on the right were 1.01 on the left 1.06. She has . had previous applications of puraply, compression which she does not tolerate very well. She has had several rounds of oral antibiotic therapy. She complains of unrelenting pain and she is seeing Dr. Reece Agar V of pain management apparently was on oxycodone but that did not help. She also had a skin biopsy done by Dr. Olegario Messier although we do not have that result. She does not appear to have an arterial issue. I am not completely clear what she has been putting on the wounds lately. 3/31; this patient has a particularly nasty set of wounds on the left medial ankle in the middle of what looks to be hemosiderin deposition secondary to chronic venous insufficiency. She has a lot of pain followed by pain management. Dr. Marcha Solders apparently did a biopsy of something on the left leg last fall what I would like to get this result. She has a history of MRSA treatment. I do not believe she had reflux studies but she did have DVT rule out studies. Previous ABIs have not suggested arterial insufficiency 4/7; difficult wounds on the left medial ankle probably chronic venous insufficiency. With considerable effort on behalf of our case manager we were able to finally to speak to somebody at the hospital in [place] who  indicated that no biopsy of this area have been done even though the patient describes this in some detail and is even able to point out where she thinks the biopsy was done. We have been using Sorbact. The PCR culture I did showed polymicrobial identification with Pseudomonas, staph aureus, Peptostreptococcus. All of this and low titers. Resistance genes identified were MRSA, staph virulence gene and tetracycline. We are going to send this  to [facility] for a topical antibiotic which is something that we have had good success with recently in large venous ulcers with a lot of purulent drainage. There would not be an easy oral alternative here possibly line escalated and ciprofloxacin if we need to use systemic antibiotic 4/15; difficult area on the left medial ankle. Most likely chronic venous insufficiency. I think she will probably need venous reflux study I think she had DVT rule outs but not venous reflux studies. We have not yet obtained the topical antibiotics. She has home health changing the dressing we have been using Sorbact for adherent fibrinous debris on the surface. Very difficult to remove 4/22; patient presents for 1 week follow-up. She has been using sore back under compression wraps and these are changed 3 times a week with home health. She also had [facility] antibiotics sent to her house and brought them in today. She has no complaints or issues today. 5/2; patient is here for follow-up. She has been using Sorbact under compression. Very painful wound. She has been using Keystone antibiotics. Not much improvement although the more medial part of the wound has cleaned up nicely and the larger part of the wound about 50% slough covered. Part of the issue here is that she had stays at both [facility] wound care center, [facility] wound care center and now [place]. Not sure if she has had venous reflux studies. As far as we are able to tell she did not have a biopsy. My notes state that she did not  have venous reflux studies just DVT rule outs. 5/16; patient goes for venous reflux studies this afternoon. She says that wound was biopsied which sounds like punch biopsies by Dr. Lynden Ang we do not have these results. We are using Sorbact. Very difficult wound to debride 5/23; patient presents for 1 week follow-up. She reports tolerating the wraps well with sorbact underneath. She had ABIs and venous reflux studies done. She has no issues or complaints today. She denies acute signs of infection. 6/6; I have reviewed the patient's vascular studies. Wounds are on the left medial and posterior calf. She had significant reflux in the greater saphenous vein in the in the mid thigh, distal thigh knee and the small saphenous vein in the popliteal fossa. The vein diameters do not look too impressive though. She is going to see the vascular surgeon on Wednesday. She also had venous reflux in the right common femoral vein. She did not have any evidence of a DVT or SVT I am wondering whether there is an ablation procedure that would benefit her in the greater saphenous vein on the left. . She tells [place] that home health put the dressing on too tight and she took off 1 layer. The swelling in her left leg is a little worse as a result of this. Not much change in the wound measurements so the surface of the wound looks better Her arterial studies showed an ABI on the right of 1.14 with a triphasic waveform and a great toe pressure of 0.89. On the left her ABIs were noncompressible at 1.34 but with triphasic waveforms and a TBI of 0.97. Her greater toe pressure was 122. There was no evidence of significant bilateral arterial disease 6/20 patient went to see Dr. Durwin Nora. He did not think she had significant arterial disease. In terms of her venous duplex on the right side there was no evidence of a DVT or SVT there was deep venous reflux involving the common femoral  vein no superficial vein reflux on the left side there  was no DVT or SVT there was no deep vein graft reflux there was some reflux in the greater saphenous vein from the mid thigh to the knee but the vein here was not dilated. He thought these were venous wounds he prescribed a compression pump but I am not sure who we ordered it from The patient has been approved for Apligraf. Still using silver collagen this week 7/5; Apligraf #1 7/19 Apligraf #2. Decent improvement in the condition of the wound bed. Epithelialization distal 8/2 Apligraf #3. No issues or complaints. Denies signs of infection. Patient History Information obtained from Patient. Family History Unknown History. Social History Former smoker, Marital Status - Single, Alcohol Use - Never, Drug Use - No History, Caffeine Use - Rarely. Medical History Respiratory Patient has history of Sleep Apnea Cardiovascular Patient has history of Hypertension, Peripheral Venous Disease Musculoskeletal Patient has history of Osteoarthritis Neurologic Patient has history of Neuropathy Medical A Surgical History Notes nd Cardiovascular Heart murmur Psychiatric Anxiety, depression Objective Constitutional respirations regular, non-labored and within target range for patient.. Vitals Time Taken: 10:55 AM, Height: 63 in, Weight: 150 lbs, BMI: 26.6, Temperature: 98.6 F, Pulse: 73 bpm, Respiratory Rate: 20 breaths/min, Blood Pressure: 144/86 mmHg. Cardiovascular 2+ dorsalis pedis/posterior tibialis pulses. Psychiatric pleasant and cooperative. General Notes: Left lower extremity: Open wound with granulation tissue and nonviable tissue present. No signs of infection. Integumentary (Hair, Skin) Wound #3 status is Open. Original cause of wound was Gradually Appeared. The date acquired was: 04/09/2019. The wound has been in treatment 18 weeks. The wound is located on the Left,Distal,Medial Lower Leg. The wound measures 5.8cm length x 1.8cm width x 0.1cm depth; 8.2cm^2 area and 0.82cm^3  volume. There is Fat Layer (Subcutaneous Tissue) exposed. There is no tunneling or undermining noted. There is a medium amount of serosanguineous drainage noted. The wound margin is flat and intact. There is large (67-100%) red, pink granulation within the wound bed. There is a small (1-33%) amount of necrotic tissue within the wound bed including Adherent Slough. 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 Today will be patient's third application of Apligraf. There is some nonviable tissue that I debrided. No signs of infection. We will continue with Apligraf placement and 3 layer compression wrap. She has home health that will help change the wraps and we will see her in 2 weeks. Procedures Wound #3 Pre-procedure diagnosis of Wound #3 is a Venous Leg Ulcer located on the Left,Distal,Medial Lower Leg .Severity of Tissue Pre Debridement is: Fat layer exposed. There was a Excisional Skin/Subcutaneous Tissue Debridement with a total area of 10.44 sq cm performed by Geralyn Corwin, DO. With the following instrument(s): Curette to remove Viable and Non-Viable tissue/material. Material removed includes Callus, Subcutaneous Tissue, Slough, Skin: Dermis, and Skin: Epidermis after achieving pain control using Lidocaine. No specimens were taken. A time out was conducted at 11:34, prior to the start of the procedure. A Minimum amount of bleeding was controlled with Pressure. The procedure was tolerated well with a pain level of 0 throughout and a pain level of 0 following the procedure. Post Debridement Measurements: 5.8cm length x 1.8cm width x 0.1cm depth; 0.82cm^3 volume. Character of Wound/Ulcer Post Debridement is improved. Severity of Tissue Post Debridement  is:  Fat layer exposed. Post procedure Diagnosis Wound #3: Same as Pre-Procedure Pre-procedure diagnosis of Wound #3 is a Venous Leg Ulcer located on the Left,Distal,Medial Lower Leg. A skin graft procedure using a bioengineered skin substitute/cellular or tissue based product was performed by Geralyn Corwin, DO with the following instrument(s): Blade, Forceps, and Scissors. Apligraf was applied and secured with Steri-Strips. 32 sq cm of product was utilized and 12 sq cm was wasted. Post Application, adaptic was applied. A Time Out was conducted at 11:15, prior to the start of the procedure. The procedure was tolerated well with a pain level of 0 throughout and a pain level of 0 following the procedure. Post procedure Diagnosis Wound #3: Same as Pre-Procedure . 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 There was a Three Layer Compression Therapy Procedure by Fonnie Mu, RN. Post procedure Diagnosis Wound #3: Same as Pre-Procedure Plan Follow-up Appointments: Return Appointment in 2 weeks. - with Dr. Leanord Hawking Cellular or Tissue Based Products: Cellular or Tissue Based Product Type: - Apligraf #3 11/07/2020 Bathing/ Shower/ Hygiene: May shower with protection but do not get wound dressing(s) wet. Edema Control - Lymphedema / SCD / Other: Elevate legs to the level of the heart or above for 30 minutes daily and/or when sitting, a frequency of: - throughout the day 3-4 times a day. Avoid standing for long periods of time. Exercise regularly Additional Orders / Instructions: Follow Nutritious Diet Home Health: New wound care orders this week; continue Home Health for wound care. May utilize formulary equivalent dressing for wound treatment orders unless otherwise specified. - Change wrap and outer dressing, Do NOT remove skin substitute. Other Home Health Orders/Instructions: - [facility] fax  #[phone] WOUND #3: - Lower Leg Wound Laterality: Left, Medial, Distal Cleanser: Soap and Water (Home Health) 2 x Per Week/30 Days Discharge Instructions: May shower and wash wound with dial antibacterial soap and water prior to dressing change. Cleanser: Wound Cleanser (Home Health) 2 x Per Week/30 Days Discharge Instructions: Cleanse the wound with wound cleanser prior to applying a clean dressing using gauze sponges, not tissue or cotton balls. Peri-Wound Care: Triamcinolone 15 (g) 2 x Per Week/30 Days Discharge Instructions: In clinic only. Apply to red, irritated periwound Peri-Wound Care: Sween Lotion (Moisturizing lotion) (Home Health) 2 x Per Week/30 Days Discharge Instructions: apply at home. Apply moisturizing lotion as directed Secondary Dressing: Woven Gauze Sponge, Non-Sterile 4x4 in (Home Health) 2 x Per Week/30 Days Discharge Instructions: Apply over primary dressing as directed. Secondary Dressing: ABD Pad, 8x10 (Home Health) 2 x Per Week/30 Days Discharge Instructions: Apply over primary dressing as directed. Secondary Dressing: ADAPTIC TOUCH 3x4.25 in 2 x Per Week/30 Days Discharge Instructions: Apply over primary dressing as directed. Com pression Wrap: ThreePress (3 layer compression wrap) 2 x Per Week/30 Days Discharge Instructions: Apply three layer compression as directed. 1. In office sharp debridement 2. Apligraf placed in standard fashion, 3 layer compression 3. Follow-up in 2 weeks Electronic Signature(s) Signed: 11/07/2020 12:12:30 PM By: Geralyn Corwin DO Entered By: Geralyn Corwin on 11/07/2020 12:11:35 -------------------------------------------------------------------------------- HxROS Details Patient Name: Date of Service: Jamie [name], [name] 11/07/2020 10:45 A M Medical Record Number: [id number] Patient Account Number: [id number] Date of Birth/Sex: Treating RN: [birth year] (80 y.o. [name] Primary Care Provider: Other  Clinician: [name] Referring Provider: Treating Provider/Extender: Eugenie Birks in Treatment: 18 Information Obtained From Patient Respiratory Medical History: Positive for: Sleep Apnea  Cardiovascular Medical History: Positive for: Hypertension; Peripheral Venous Disease Past Medical History Notes: Heart murmur Musculoskeletal Medical History: Positive for: Osteoarthritis Neurologic Medical History: Positive for: Neuropathy Psychiatric Medical History: Past Medical History Notes: Anxiety, depression Immunizations Pneumococcal Vaccine: Received Pneumococcal Vaccination: Yes Received Pneumococcal Vaccination On or After 60th Birthday: No Implantable Devices None Family and Social History Unknown History: Yes; Former smoker; Marital Status - Single; Alcohol Use: Never; Drug Use: No History; Caffeine Use: Rarely; Financial Concerns: No; Food, Clothing or Shelter Needs: No; Support System Lacking: No; Transportation Concerns: No Electronic Signature(s) Signed: 11/07/2020 12:12:30 PM By: Geralyn Corwin DO Signed: 12/19/2020 8:00:33 AM By: Fonnie Mu RN Entered By: Geralyn Corwin on 11/07/2020 12:08:16 -------------------------------------------------------------------------------- SuperBill Details Patient Name: Date of Service: Jamie [name], Christean Grief. 11/07/2020 Medical Record Number: [id number] Patient Account Number: [id number] Date of Birth/Sex: Treating RN: 01/16/1941 (80 y.o. 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 I87.332 Chronic venous hypertension (idiopathic) with ulcer and inflammation of left lower  extremity Facility Procedures CPT4 Code: 05397673 Description: (Facility Use Only) Apligraf 1 SQ CM Modifier: Quantity: 44 CPT4 Code: 41937902 Description: 15275 - SKIN SUB GRAFT FACE/NK/HF/G ICD-10 Diagnosis Description L97.922 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 venous hypertension (idiopathic) with ulcer and inflammation of left lo Modifier: exposed wer extremity Quantity: 1 Physician Procedures : CPT4 Code Description Modifier [id number] 15275 - WC PHYS SKIN SUB GRAFT FACE/NK/HF/G ICD-10 Diagnosis Description L97.922 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

## 2020-12-20 NOTE — Progress Notes (Signed)
CHESSA, BARRASSO ([id number]) Visit Report for 12/19/2020 Arrival Information Details Patient Name: Date of Service: Jamie [name], [name]. 12/19/2020 12:30 PM Medical Record Number: [id number] Patient Account Number: [id number] Date of Birth/Sex: Treating RN: 1940/12/05 (80 y.o. Jamie [name], Jamie [name] Primary Care Marquita Lias: Jamie [name] Other Clinician: Referring Phyllis Abelson: Treating Tynetta Bachmann/Extender: Jamie [name] in Treatment: 24 Visit Information History Since Last Visit Added or deleted any medications: No Patient Arrived: Wheel Chair Any new allergies or adverse reactions: No Arrival Time: 12:39 Had a fall or experienced change in No Accompanied By: Jamie [name] activities of daily living that may affect Transfer Assistance: Manual risk of falls: Patient Identification Verified: Yes Signs or symptoms of abuse/neglect since last visito No Secondary Verification Process Completed: Yes Hospitalized since last visit: No Patient Requires Transmission-Based Precautions: No Implantable device outside of the clinic excluding No Patient Has Alerts: Yes cellular tissue based products placed in the center Patient Alerts: Jamie ABI: 1.06; R ABI: 1.01 since last visit: Has Dressing in Place as Prescribed: Yes Pain Present Now: No Electronic Signature(s) Signed: 12/19/2020 5:15:03 PM By: Jamie Mu RN Entered By: Jamie [name] on 12/19/2020 12:39:51 -------------------------------------------------------------------------------- Clinic Level of Care Assessment Details Patient Name: Date of Service: Jamie [name], [name]. 12/19/2020 12:30 PM Medical Record Number: [id number] Patient Account Number: [id number] Date of Birth/Sex: Treating RN: 1940-12-03 (80 y.o. Jamie [name], Jamie [name] Primary Care Jamie [name]: Jamie [name] Other Clinician: Referring Jamie [name]: Treating Jamie [name]/Extender: Jamie [name] in Treatment: 24 Clinic Level of Care Assessment Items TOOL 4 Quantity Score X- 1 0 Use when only an EandM is performed on FOLLOW-UP visit ASSESSMENTS - Nursing Assessment / Reassessment X- 1 10 Reassessment of Co-morbidities (includes updates in patient status) X- 1 5 Reassessment of Adherence to Treatment Plan ASSESSMENTS - Wound and Skin A ssessment / Reassessment X - Simple Wound Assessment / Reassessment - one wound 1 5 []  - 0 Complex Wound Assessment / Reassessment - multiple wounds []  - 0 Dermatologic / Skin Assessment (not related to wound area) ASSESSMENTS - Focused Assessment X- 1 5 Circumferential Edema Measurements - multi extremities []  - 0 Nutritional Assessment / Counseling / Intervention []  - 0 Lower Extremity Assessment (monofilament, tuning fork, pulses) []  - 0 Peripheral Arterial Disease Assessment (using hand held doppler) ASSESSMENTS - Ostomy and/or Continence Assessment and Care []  - 0 Incontinence Assessment and Management []  - 0 Ostomy Care Assessment and Management (repouching, etc.) PROCESS - Coordination of Care X - Simple Patient / Family Education for ongoing care 1 15 []  - 0 Complex (extensive) Patient / Family Education for ongoing care X- 1 10 Staff obtains , Records, T Results / Process Orders est []  - 0 Staff telephones HHA, Nursing Homes / Clarify orders / etc []  - 0 Routine Transfer to another Facility (non-emergent condition) []  - 0 Routine Hospital Admission (non-emergent condition) []  - 0 New Admissions / / Ordering NPWT Apligraf, etc. , []  - 0 Emergency Hospital Admission (emergent condition) X- 1 10 Simple Discharge Coordination []  - 0 Complex (extensive) Discharge Coordination PROCESS - Special Needs []  - 0 Pediatric / Minor Patient Management []  - 0 Isolation Patient Management []  - 0 Hearing / Language / Visual special needs []  - 0 Assessment of Community assistance  (transportation, D/C planning, etc.) []  - 0 Additional assistance / Altered mentation []  - 0 Support Surface(s) Assessment (bed, cushion, seat, etc.) INTERVENTIONS - Wound Cleansing / Measurement X - Simple Wound Cleansing - one  wound 1 5 []  - 0 Complex Wound Cleansing - multiple wounds X- 1 5 Wound Imaging (photographs - any number of wounds) []  - 0 Wound Tracing (instead of photographs) X- 1 5 Simple Wound Measurement - one wound []  - 0 Complex Wound Measurement - multiple wounds INTERVENTIONS - Wound Dressings []  - 0 Small Wound Dressing one or multiple wounds []  - 0 Medium Wound Dressing one or multiple wounds []  - 0 Large Wound Dressing one or multiple wounds []  - 0 Application of Medications - topical []  - 0 Application of Medications - injection INTERVENTIONS - Miscellaneous []  - 0 External ear exam []  - 0 Specimen Collection (cultures, biopsies, blood, body fluids, etc.) []  - 0 Specimen(s) / Culture(s) sent or taken to Lab for analysis []  - 0 Patient Transfer (multiple staff / / Similar devices) []  - 0 Simple Staple / Suture removal (25 or less) []  - 0 Complex Staple / Suture removal (26 or more) []  - 0 Hypo / Hyperglycemic Management (close monitor of Blood Glucose) []  - 0 Ankle / Brachial Index (ABI) - do not check if billed separately X- 1 5 Vital Signs Has the patient been seen at the hospital within the last three years: Yes Total Score: 80 Level Of Care: New/Established - Level 3 Electronic Signature(s) Signed: 12/19/2020 5:15:03 PM By: RN Entered By: on 12/19/2020 12:56:30 -------------------------------------------------------------------------------- Encounter Discharge Information Details Patient Name: Date of Service: , W. 12/19/2020 12:30 PM Medical Record Number: Patient Account Number: Date of Birth/Sex: Treating RN: 09/18/1940 (80 y.o. Nurse, adult,  Jamie [name] Primary Care Lulie [name]: Other Clinician: Referring Jamie [name]: Treating Jamie [name]/Extender: in Treatment: 24 Encounter Discharge Information Items Discharge Condition: Stable Ambulatory Status: Wheelchair Discharge Destination: Home Transportation: Private Auto Accompanied By: adaughter in [name] Schedule Follow-up Appointment: Yes Clinical Summary of Care: Patient Declined Electronic Signature(s) Signed: 12/19/2020 5:15:03 PM By: 12/21/2020 RN Entered By: Jamie [name] on 12/19/2020 12:57:51 -------------------------------------------------------------------------------- Lower Extremity Assessment Details Patient Name: Date of Service: 12/21/2020, Jamie [name] W. 12/19/2020 12:30 PM Medical Record Number: 12/21/2020 Patient Account Number: [id number] Date of Birth/Sex: Treating RN: 1941-03-05 (80 y.o. 96, Jamie [name] Primary Care Malillany Kazlauskas: Jamie [name] Other Clinician: Referring Zamar Odwyer: Treating Kanchan Gal/Extender: Jamie [name] Weeks in Treatment: 24 Edema Assessment Assessed: 25: Yes] 12/21/2020: No] Edema: [Left: N] [Right: o] Calf Left: Right: Point of Measurement: 29 cm From Medial Instep 34 cm Ankle Left: Right: Point of Measurement: 11 cm From Medial Instep 22 cm Vascular Assessment Pulses: Dorsalis Pedis Palpable: [Left:Yes] Posterior Tibial Palpable: [Left:Yes] Electronic Signature(s) Signed: 12/19/2020 5:15:03 PM By: Jamie Mu RN Entered By: 12/21/2020 on 12/19/2020 12:40:39 -------------------------------------------------------------------------------- Multi Wound Chart Details Patient Name: Date of Service: Harrell Gave, 12/21/2020 W. 12/19/2020 12:30 PM Medical Record Number: [id number] Patient Account Number: 06/13/1940 Date of Birth/Sex: Treating RN: November 14, 1940 (80 y.o. Jamie [name] Primary Care Clell Trahan:  [name] Other Clinician: Referring Eloise Picone: Treating Elinda Bunten/Extender: 25 in Treatment: 24 Vital Signs Height(in): 63 Pulse(bpm): Weight(lbs): 150 Blood Pressure(mmHg): Body Mass Index(BMI): 27 Temperature(F): Respiratory Rate(breaths/min): 17 Photos: [3:No Photos Left, Distal, Medial Lower Leg] [N/A:N/A N/A] Wound Location: [3:Gradually Appeared] [N/A:N/A] Wounding Event: [3:Venous Leg Ulcer] [N/A:N/A] Primary Etiology: [3:Sleep Apnea, Hypertension, Peripheral N/A] Comorbid History: [3:Venous Disease, Osteoarthritis, Neuropathy 04/09/2019] [N/A:N/A] Date Acquired: [3:24] [N/A:N/A] Weeks of Treatment: [3:Open] [N/A:N/A] Wound Status: [3:0x0x0] [N/A:N/A] Measurements Jamie x W x D (cm) [3:0] [N/A:N/A] A (  cm) : rea [3:0] [N/A:N/A] Volume (cm) : [3:100.00%] [N/A:N/A] % Reduction in Area: [3:100.00%] [N/A:N/A] % Reduction in Volume: [3:Full Thickness Without Exposed] [N/A:N/A] Classification: [3:Support Structures Medium] [N/A:N/A] Exudate Amount: [3:Serosanguineous] [N/A:N/A] Exudate Type: [3:red, brown] [N/A:N/A] Exudate Color: [3:Distinct, outline attached] [N/A:N/A] Wound Margin: [3:Large (67-100%)] [N/A:N/A] Granulation Amount: [3:Red, Pink] [N/A:N/A] Granulation Quality: [3:None Present (0%)] [N/A:N/A] Necrotic Amount: [3:Fat Layer (Subcutaneous Tissue): Yes N/A] Exposed Structures: [3:Fascia: No Tendon: No Muscle: No Joint: No Bone: No Medium (34-66%)] [N/A:N/A] Treatment Notes Electronic Signature(s) Signed: 12/19/2020 5:15:03 PM By: Jamie Mu RN Signed: 12/20/2020 3:40:47 PM By: [name] MD Entered By: [name] on 12/19/2020 13:10:29 -------------------------------------------------------------------------------- Multi-Disciplinary Care Plan Details Patient Name: Date of Service: Jamie Brink, MA [name] 12/19/2020 12:30 PM Medical Record Number: [id number] Patient Account Number:  [id number] Date of Birth/Sex: Treating RN: 1941/01/19 (80 y.o. [name] Primary Care Muriel Hannold: Jamie [name] Other Clinician: Referring Kimberlie Csaszar: Treating Dontavious Emily/Extender: Jamie [name] in Treatment: 24 Active Inactive Electronic Signature(s) Signed: 12/19/2020 5:15:03 PM By: Jamie Mu RN Entered By: Jamie [name] on 12/19/2020 12:55:44 -------------------------------------------------------------------------------- Pain Assessment Details Patient Name: Date of Service: Jamie [name], [name] 12/19/2020 12:30 PM Medical Record Number: [id number] Patient Account Number: [id number] Date of Birth/Sex: Treating RN: Mar 17, 1941 (80 y.o. [name] Primary Care Jasmon Graffam: Jamie [name] Other Clinician: Referring Markiyah Gahm: Treating Camyra Vaeth/Extender: Jamie [name] in Treatment: 24 Active Problems Location of Pain Severity and Description of Pain Patient Has Paino No Site Locations Rate the pain. Current Pain Level: 0 Pain Management and Medication Current Pain Management: Electronic Signature(s) Signed: 12/19/2020 5:15:03 PM By: Jamie Mu RN Entered By: Jamie [name] on 12/19/2020 12:40:31 -------------------------------------------------------------------------------- Patient/Caregiver Education Details Patient Name: Date of Service: [name] 9/13/2022andnbsp12:30 PM Medical Record Number: [id number] Patient Account Number: [id number] Date of Birth/Gender: Treating RN: [birth year] (80 y.o. [name] Primary Care Physician: Jamie [name] Other Clinician: Referring Physician: Treating Physician/Extender: Jamie [name] in Treatment: 24 Education Assessment Education Provided To: Patient Education Topics Provided Wound/Skin Impairment: Methods: Explain/Verbal Responses:  State content correctly [employer]) Signed: 12/19/2020 5:15:03 PM By: Jamie Mu RN Entered By: Jamie [name] on 12/19/2020 12:55:59 -------------------------------------------------------------------------------- Wound Assessment Details Patient Name: Date of Service: Jamie [name], [name] 12/19/2020 12:30 PM Medical Record Number: [id number] Patient Account Number: [id number] Date of Birth/Sex: Treating RN: 09-13-1940 (79 y.o. Jamie [name], Jamie [name] Primary Care Jarel Cuadra: Jamie [name] Other Clinician: Referring Sade Hollon: Treating Conner Muegge/Extender: [name] Weeks in Treatment: 24 Wound Status Wound Number: 3 Primary Venous Leg Ulcer Etiology: Wound Location: Left, Distal, Medial Lower Leg Wound Open Wounding Event: Gradually Appeared Status: Date Acquired: 04/09/2019 Comorbid Sleep Apnea, Hypertension, Peripheral Venous Disease, Weeks Of Treatment: 24 History: Osteoarthritis, Neuropathy Clustered Wound: No Wound Measurements Length: (cm) Width: (cm) Depth: (cm) Area: (cm) Volume: (cm) 0 % Reduction in Area: 100% 0 % Reduction in Volume: 100% 0 Epithelialization: Medium (34-66%) 0 0 Wound Description Classification: Full Thickness Without Exposed Support Structures Wound Margin: Distinct, outline attached Exudate Amount: Medium Exudate Type: Serosanguineous Exudate Color: red, brown Foul Odor After Cleansing: No Slough/Fibrino No Wound Bed Granulation Amount: Large (67-100%) Exposed Structure Granulation Quality: Red, Pink Fascia Exposed: No Necrotic Amount: None Present (0%) Fat Layer (Subcutaneous Tissue) Exposed: Yes Tendon Exposed: No Muscle Exposed: No Joint Exposed: No Bone Exposed: No Electronic Signature(s) Signed: 12/19/2020 5:15:03 PM By: Jamie Mu RN Entered By: Jamie [name] on 12/19/2020  12:54:14 -------------------------------------------------------------------------------- Vitals Details Patient Name: Date  of Service: Jamie [name], [name]. 12/19/2020 12:30 PM Medical Record Number: [id number] Patient Account Number: [id number] Date of Birth/Sex: Treating RN: 1940/07/07 (80 y.o. Jamie [name], Jamie [name] Primary Care Hazem Kenner: Jamie [name] Other Clinician: Referring Evaluna Utke: Treating Aloysius Heinle/Extender: Jamie [name] in Treatment: 24 Vital Signs Time Taken: 12:40 Respiratory Rate (breaths/min): 17 Height (in): 63 Reference Range: 80 - 120 mg / dl Weight (lbs): 338 Body Mass Index (BMI): 26.6 Electronic Signature(s) Signed: 12/19/2020 5:15:03 PM By: Jamie Mu RN Entered By: Jamie [name] on 12/19/2020 12:40:16

## 2020-12-20 NOTE — Progress Notes (Signed)
ANINA, SCHNAKE ([id number]) Visit Report for 12/19/2020 HPI Details Patient Name: Date of Service: Jamie [name], [name]. 12/19/2020 12:30 PM Medical Record Number: [id number] Patient Account Number: [id number] Date of Birth/Sex: Treating RN: Dec 10, 1940 (80 y.o. [name] Primary Care Provider: [name] Other Clinician: Referring Provider: Treating Provider/Extender: Vivia Budge in Treatment: 24 History of Present Illness HPI Description: ADMISSION 06/30/2020 Mrs. Langhorne is an 80 year old woman who lives in [place]. She is here with her niece for review of wounds on the left medial lower leg and ankle. These have apparently been present for over a year and she followed with Dr. Olegario Messier at the [facility] in [place] for quite a period of time although it looks as though there was a initial consult wound from Dr. Marcha Solders on February 18 presumably there was therefore hiatus. At that point the wounds were described as being there for 3 months. She also tells me she was at the wound care center in [place] for a period of time with this. 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 She had venous studies that were negative for DVT ABIs on the right were 1.01 on the left 1.06. She has . had previous applications of puraply, compression which she does not tolerate very well. She has had several rounds of oral antibiotic therapy. She complains of unrelenting pain and she is seeing Dr. Reece Agar V of pain management apparently was on oxycodone but that did not help. She also had a skin biopsy done by Dr. Olegario Messier although we do not have that result. She does not appear to have an arterial issue. I am not completely clear what she has been putting on the wounds lately. 3/31; this patient has a particularly nasty set of wounds on the left medial ankle in the middle of what looks to be  hemosiderin deposition secondary to chronic venous insufficiency. She has a lot of pain followed by pain management. Dr. Marcha Solders apparently did a biopsy of something on the left leg last fall what I would like to get this result. She has a history of MRSA treatment. I do not believe she had reflux studies but she did have DVT rule out studies. Previous ABIs have not suggested arterial insufficiency 4/7; difficult wounds on the left medial ankle probably chronic venous insufficiency. With considerable effort on behalf of our case manager we were able to finally to speak to somebody at the hospital in [place] who indicated that no biopsy of this area have been done even though the patient describes this in some detail and is even able to point out where she thinks the biopsy was done. We have been using Sorbact. The PCR culture I did showed polymicrobial identification with Pseudomonas, staph aureus, Peptostreptococcus. All of this and low titers. Resistance genes identified were MRSA, staph virulence gene and tetracycline. We are going to send this to [facility] for a topical antibiotic which is something that we have had good success with recently in large venous ulcers with a lot of purulent drainage. There would not be an easy oral alternative here possibly line escalated and ciprofloxacin if we need to use systemic antibiotic 4/15; difficult area on the left medial ankle. Most likely chronic venous insufficiency. I think she will probably need venous reflux study I think she had DVT rule outs but not venous reflux studies. We have not yet obtained the topical antibiotics. She has home health changing the  dressing we have been using Sorbact for adherent fibrinous debris on the surface. Very difficult to remove 4/22; patient presents for 1 week follow-up. She has been using sore back under compression wraps and these are changed 3 times a week with home health. She also had Keystone antibiotics sent to her  house and brought them in today. She has no complaints or issues today. 5/2; patient is here for follow-up. She has been using Sorbact under compression. Very painful wound. She has been using Keystone antibiotics. Not much improvement although the more medial part of the wound has cleaned up nicely and the larger part of the wound about 50% slough covered. Part of the issue here is that she had stays at both [facility] wound care center, [facility] wound care center and now [place]. Not sure if she has had venous reflux studies. As far as we are able to tell she did not have a biopsy. My notes state that she did not have venous reflux studies just DVT rule outs. 5/16; patient goes for venous reflux studies this afternoon. She says that wound was biopsied which sounds like punch biopsies by Dr. Lynden Ang we do not have these results. We are using Sorbact. Very difficult wound to debride 5/23; patient presents for 1 week follow-up. She reports tolerating the wraps well with sorbact underneath. She had ABIs and venous reflux studies done. She has no issues or complaints today. She denies acute signs of infection. 6/6; I have reviewed the patient's vascular studies. Wounds are on the left medial and posterior calf. She had significant reflux in the greater saphenous vein in the in the mid thigh, distal thigh knee and the small saphenous vein in the popliteal fossa. The vein diameters do not look too impressive though. She is going to see the vascular surgeon on Wednesday. She also had venous reflux in the right common femoral vein. She did not have any evidence of a DVT or SVT I am wondering whether there is an ablation procedure that would benefit her in the greater saphenous vein on the left. . She tells [place] that home health put the dressing on too tight and she took off 1 layer. The swelling in her left leg is a little worse as a result of this. Not much change in the wound measurements so the surface of the wound  looks better Her arterial studies showed an ABI on the right of 1.14 with a triphasic waveform and a great toe pressure of 0.89. On the left her ABIs were noncompressible at 1.34 but with triphasic waveforms and a TBI of 0.97. Her greater toe pressure was 122. There was no evidence of significant bilateral arterial disease 6/20 patient went to see Dr. Durwin Nora. He did not think she had significant arterial disease. In terms of her venous duplex on the right side there was no evidence of a DVT or SVT there was deep venous reflux involving the common femoral vein no superficial vein reflux on the left side there was no DVT or SVT there was no deep vein graft reflux there was some reflux in the greater saphenous vein from the mid thigh to the knee but the vein here was not dilated. He thought these were venous wounds he prescribed a compression pump but I am not sure who we ordered it from The patient has been approved for Apligraf. Still using silver collagen this week 7/5; Apligraf #1 7/19 Apligraf #2. Decent improvement in the condition of the wound bed. Epithelialization  distal 8/2 Apligraf #3. No issues or complaints. Denies signs of infection. 8/17 Apligraf #4. No issues or concerns. Some complaints of pruritus and the rash. Our intake nurse brought up the fact that she had previously indicated possible cotton layer sensitivity we will therefore use kerlix in the bottom layer of the compression 8/30; the patient comes in with the area on her left medial leg just about healed. There is a superficial area more towards the tibia and a smaller open area distally everything else is epithelialized. I do not think she requires another Apligraf 9/13; left medial leg is healed. 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 Her edema control is good Psychologist, prison and probation services) Signed: 12/20/2020 3:40:47 PM By: [name] MD Entered By: [name]  on 12/19/2020 13:11:09 -------------------------------------------------------------------------------- Physical Exam Details Patient Name: Date of Service: Jamie [name], [name] 12/19/2020 12:30 PM Medical Record Number: [id number] Patient Account Number: [id number] Date of Birth/Sex: Treating RN: 06-30-1940 (80 y.o. 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 Notes Wound exam; left medial lower leg is all closed. Edema control is good Electronic Signature(s) Signed: 12/20/2020 3:40:47 PM By: [name] MD Entered By: [name] on 12/19/2020 13:11:42 -------------------------------------------------------------------------------- Physician Orders Details Patient Name: Date of Service: Jamie [name], [name] 12/19/2020 12:30 PM Medical Record Number: [id number] Patient Account Number: [id number] Date of Birth/Sex: Treating RN: [birth year] (80 y.o. [name], Lauren Primary Care Provider: [name] Other Clinician: Referring Provider: Treating Provider/Extender: Vivia Budge in Treatment: 24 Verbal / Phone Orders: No Diagnosis Coding Follow-up Appointments Other: - No need to follow up!!:) Call us if you have nay future concerns!!:) Discharge From [facility] Services Discharge from Wound Care Center Edema Control - Lymphedema / SCD / Other Elevate legs to the level of the heart or above for 30 minutes daily and/or when sitting, a frequency of: Avoid standing for long periods of time. Patient to wear own compression stockings every day. - 15-20 mm/Hg Exercise regularly Moisturize legs daily. Electronic Signature(s) Signed: 12/19/2020 5:15:03 PM By: Fonnie Mu RN Signed:  12/20/2020 3:40:47 PM By: [name] MD Entered By: Fonnie Mu on 12/19/2020 12:55:33 -------------------------------------------------------------------------------- Problem List Details Patient Name: Date of Service: Jamie [name], [name] 12/19/2020 12:30 PM Medical Record Number: [id number] Patient Account Number: [id number] Date of Birth/Sex: Treating RN: 04-06-1941 (80 y.o. 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 12/19/2020 12:30 PM Medical Record Number: [id number] Patient Account Number: [id number] Date of Birth/Sex: Treating RN: 1940/12/04 (80 y.o. [name] Primary Care Provider: [name] Other Clinician: Referring Provider: Treating Provider/Extender: [name] Weeks in Treatment: 24 Subjective History of Present Illness (HPI) ADMISSION 06/30/2020 Mrs. Judy is an 80 year old woman who lives in [place]. She  is here with her niece for review of wounds on the left medial lower leg and ankle. These have apparently been present for over a year and she followed with Dr. Olegario Messier at the [facility] in [place] for quite a  period of time although it looks as though there was a initial consult wound from Dr. Marcha Solders on February 18 presumably there was therefore hiatus. At that point the wounds were described as being there for 3 months. She also tells me she was at the wound care center in [place] for a period of time with this. 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 She had venous studies that were negative for DVT ABIs on the right were 1.01 on the left 1.06. She has . had previous applications of puraply, compression which she does not tolerate very well. She has had several rounds of oral antibiotic therapy. She complains of unrelenting pain and she is seeing Dr. Reece Agar V of pain management apparently was on oxycodone but that did not help. She also had a skin biopsy done by Dr. Olegario Messier although we do not have that result. She does not appear to have an arterial issue. I am not completely clear what she has been putting on the wounds lately. 3/31; this patient has a particularly nasty set of wounds on the left medial ankle in the middle of what looks to be hemosiderin deposition secondary to chronic venous insufficiency. She has a lot of pain followed by pain management. Dr. Marcha Solders apparently did a biopsy of something on the left leg last fall what I would like to get this result. She has a history of MRSA treatment. I do not believe she had reflux studies but she did have DVT rule out studies. Previous ABIs have not suggested arterial insufficiency 4/7; difficult wounds on the left medial ankle probably chronic venous insufficiency. With considerable effort on behalf of our case manager we were able to finally to speak to somebody at the hospital in [place] who indicated that no biopsy of this area have been done even though the patient describes this in some detail and is even able to point out where she thinks the biopsy was done. We have been using Sorbact. The PCR  culture I did showed polymicrobial identification with Pseudomonas, staph aureus, Peptostreptococcus. All of this and low titers. Resistance genes identified were MRSA, staph virulence gene and tetracycline. We are going to send this to [facility] for a topical antibiotic which is something that we have had good success with recently in large venous ulcers with a lot of purulent drainage. There would not be an easy oral alternative here possibly line escalated and ciprofloxacin if we need to use systemic antibiotic 4/15; difficult area on the left medial ankle. Most likely chronic venous insufficiency. I think she will probably need venous reflux study I think she had DVT rule outs but not venous reflux studies. We have not yet obtained the topical antibiotics. She has home health changing the dressing we have been using Sorbact for adherent fibrinous debris on the surface. Very difficult to remove 4/22; patient presents for 1 week follow-up. She has been using sore back under compression wraps and these are changed 3 times a week with home health. She also had Keystone antibiotics sent to her house and brought them in today. She has no complaints or issues today. 5/2; patient is here for follow-up. She has been using Sorbact under compression. Very painful  wound. She has been using Keystone antibiotics. Not much improvement although the more medial part of the wound has cleaned up nicely and the larger part of the wound about 50% slough covered. Part of the issue here is that she had stays at both [facility] wound care center, [facility] wound care center and now [place]. Not sure if she has had venous reflux studies. As far as we are able to tell she did not have a biopsy. My notes state that she did not have venous reflux studies just DVT rule outs. 5/16; patient goes for venous reflux studies this afternoon. She says that wound was biopsied which sounds like punch biopsies by Dr. Lynden Ang we do not have these  results. We are using Sorbact. Very difficult wound to debride 5/23; patient presents for 1 week follow-up. She reports tolerating the wraps well with sorbact underneath. She had ABIs and venous reflux studies done. She has no issues or complaints today. She denies acute signs of infection. 6/6; I have reviewed the patient's vascular studies. Wounds are on the left medial and posterior calf. She had significant reflux in the greater saphenous vein in the in the mid thigh, distal thigh knee and the small saphenous vein in the popliteal fossa. The vein diameters do not look too impressive though. She is going to see the vascular surgeon on Wednesday. She also had venous reflux in the right common femoral vein. She did not have any evidence of a DVT or SVT I am wondering whether there is an ablation procedure that would benefit her in the greater saphenous vein on the left. . She tells [place] that home health put the dressing on too tight and she took off 1 layer. The swelling in her left leg is a little worse as a result of this. Not much change in the wound measurements so the surface of the wound looks better Her arterial studies showed an ABI on the right of 1.14 with a triphasic waveform and a great toe pressure of 0.89. On the left her ABIs were noncompressible at 1.34 but with triphasic waveforms and a TBI of 0.97. Her greater toe pressure was 122. There was no evidence of significant bilateral arterial disease 6/20 patient went to see Dr. Durwin Nora. He did not think she had significant arterial disease. In terms of her venous duplex on the right side there was no evidence of a DVT or SVT there was deep venous reflux involving the common femoral vein no superficial vein reflux on the left side there was no DVT or SVT there was no deep vein graft reflux there was some reflux in the greater saphenous vein from the mid thigh to the knee but the vein here was not dilated. He thought these were venous wounds  he prescribed a compression pump but I am not sure who we ordered it from The patient has been approved for Apligraf. Still using silver collagen this week 7/5; Apligraf #1 7/19 Apligraf #2. Decent improvement in the condition of the wound bed. Epithelialization distal 8/2 Apligraf #3. No issues or complaints. Denies signs of infection. 8/17 Apligraf #4. No issues or concerns. Some complaints of pruritus and the rash. Our intake nurse brought up the fact that she had previously indicated possible cotton layer sensitivity we will therefore use kerlix in the bottom layer of the compression 8/30; the patient comes in with the area on her left medial leg just about healed. There is a superficial area more towards the tibia and  a smaller open area distally everything else is epithelialized. I do not think she requires another Apligraf 9/13; left medial leg is healed. 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 Her edema control is good Objective Constitutional Vitals Time Taken: 12:40 PM, Height: 63 in, Weight: 150 lbs, BMI: 26.6, Respiratory Rate: 17 breaths/min. 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 General Notes: Wound exam; left medial lower leg is all closed. Edema control is good Integumentary (Hair, Skin) Wound #3 status is Open. Original cause of wound was Gradually Appeared. The date acquired was: 04/09/2019. The wound has been in treatment 24 weeks. The wound is located on the Left,Distal,Medial Lower Leg. The wound measures 0cm length x 0cm width x 0cm depth; 0cm^2 area and 0cm^3 volume. There is Fat Layer (Subcutaneous Tissue) exposed. There is a medium amount of serosanguineous drainage noted. The wound margin is distinct with the outline attached to the wound base. There is large (67-100%) red, pink granulation within the wound bed. There is no necrotic tissue within the wound  bed. 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 Patient to wear own compression stockings every day. - 15-20 mm/Hg Exercise regularly Moisturize legs daily. 1. The patient's wounds are all closed. As noted there this is her third wound care center 2. I wanted to put her in 20/30 mmHg support stockings however the patient showed me her hands which I do not think will allow her to get that degree of compression on. They we therefore settled on support hose at 15 to 20 mmHg. I am not totally confident that this will maintain skin integrity however. If she comes back in short order she would definitely require an external compression stocking 3. I have advised continuous skin lubrication at the least nightly and to elevate her legs. She expressed understanding Electronic Signature(s) Signed: 12/20/2020 3:40:47 PM By: [name] MD Entered By: [name] on 12/19/2020 13:13:11 -------------------------------------------------------------------------------- SuperBill Details Patient Name: Date of Service: Jamie Brink, MA [name] 12/19/2020 Medical Record Number: [id number] Patient Account Number: [id number] Date of Birth/Sex: Treating RN: 1941-02-25 (80 y.o. 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 I87.332 Chronic venous hypertension (idiopathic) with ulcer and inflammation of left lower extremity Facility Procedures CPT4 Code: [id number] Description: 99213 - WOUND CARE VISIT-LEV 3 EST PT Modifier: Quantity: 1 Physician Procedures : CPT4 Code Description Modifier [id number] [phone] - WC PHYS LEVEL 2 - EST PT ICD-10 Diagnosis Description L97.922 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

## 2022-05-22 ENCOUNTER — Encounter (HOSPITAL_BASED_OUTPATIENT_CLINIC_OR_DEPARTMENT_OTHER): Payer: Medicare HMO | Attending: General Surgery | Admitting: Physician Assistant

## 2022-05-22 DIAGNOSIS — I872 Venous insufficiency (chronic) (peripheral): Secondary | ICD-10-CM | POA: Diagnosis not present

## 2022-05-22 DIAGNOSIS — G629 Polyneuropathy, unspecified: Secondary | ICD-10-CM | POA: Diagnosis not present

## 2022-05-22 DIAGNOSIS — L97812 Non-pressure chronic ulcer of other part of right lower leg with fat layer exposed: Secondary | ICD-10-CM | POA: Diagnosis present

## 2022-05-22 DIAGNOSIS — I10 Essential (primary) hypertension: Secondary | ICD-10-CM | POA: Insufficient documentation

## 2022-05-22 DIAGNOSIS — L97822 Non-pressure chronic ulcer of other part of left lower leg with fat layer exposed: Secondary | ICD-10-CM | POA: Insufficient documentation

## 2022-05-22 DIAGNOSIS — I2510 Atherosclerotic heart disease of native coronary artery without angina pectoris: Secondary | ICD-10-CM | POA: Insufficient documentation

## 2022-05-22 DIAGNOSIS — G4730 Sleep apnea, unspecified: Secondary | ICD-10-CM | POA: Insufficient documentation

## 2022-05-23 ENCOUNTER — Encounter (HOSPITAL_BASED_OUTPATIENT_CLINIC_OR_DEPARTMENT_OTHER): Payer: Medicare HMO | Admitting: Physician Assistant

## 2022-05-23 NOTE — Progress Notes (Signed)
[place], EAST ([id number]) [id number] Page 1 of 8 Visit Report for 05/22/2022 Allergy List Details Patient Name: Date of Service: Jamie [name], [place]. 05/22/2022 1:45 PM Medical Record Number: [id number] Patient Account Number: [id number] Date of Birth/Sex: Treating RN: 03-02-1941 (82 y.o. Jamie [name] Primary Care Adyn Hoes: Ozella Rocks Other Clinician: Referring Kaemon Barnett: Treating Autumne Kallio/Extender: Alfredo Martinez, [name] in Treatment: 0 Allergies Active Allergies gabapentin tizanidine amlodipine niacin aspirin lisinopril valsartan Allergy Notes Electronic Signature(s) Signed: 05/22/2022 3:48:00 PM By: Sharyn Creamer RN, BSN Entered By: Sharyn Creamer on 05/22/2022 14:10:16 -------------------------------------------------------------------------------- Arrival Information Details Patient Name: Date of Service: Jamie [name], Jamie Dark [name]. 05/22/2022 1:45 PM Medical Record Number: [id number] Patient Account Number: [id number] Date of Birth/Sex: Treating RN: 1940-07-20 (82 y.o. Jamie [name] Primary Care Kemond Amorin: Ozella Rocks Other Clinician: Referring Natania Finigan: Treating Alonnie Bieker/Extender: Alfredo Martinez, [name] in Treatment: 0 Visit Information Patient Arrived: Wheel Chair Arrival Time: 13:58 Accompanied By: neice Transfer Assistance: None Patient Identification Verified: Yes Secondary Verification Process CompletedHENESIS, MULLINS ([id number]) Electronic Signature(s) Signed: 05/22/2022 3:48:00 PM By: Sharyn Creamer RN, BSN Entered By: Phineas Douglas, History Since Last Visit Added or deleted any medications: Yes Any new allergies or adverse reactions: No Had a fall or experienced change in activities of daily living that may affect risk of falls: No Signs or symptoms of abuse/neglect since last visito No Hospitalized since last visit: No Implantable  device outside of the clinic excluding cellular tissue based products placed in the center since last visit: No Has Dressing in Place as Prescribed: Yes Pain Present Now: Yes [id number] Page 2 of 8 -------------------------------------------------------------------------------- Clinic Level of Care Assessment Details Patient Name: Date of Service: Jamie [name], [place]. 05/22/2022 1:45 PM Medical Record Number: [id number] Patient Account Number: [id number] Date of Birth/Sex: Treating RN: 1941-03-19 (82 y.o. Jamie [name] Primary Care Yeray Tomas: Ozella Rocks Other Clinician: Referring Tailyn Hantz: Treating Quanta Robertshaw/Extender: Alfredo Martinez, [name] in Treatment: 0 Clinic Level of Care Assessment Items TOOL 3 Quantity Score X- 1 0 Use when EandM and Procedure is performed on FOLLOW-UP visit ASSESSMENTS - Nursing Assessment / Reassessment X- 1 10 Reassessment of Co-morbidities (includes updates in patient status) X- 1 5 Reassessment of Adherence to Treatment Plan ASSESSMENTS - Wound and Skin Assessment / Reassessment []$  - Points for Wound Assessment can only be taken for a new wound of unknown or different etiology and a procedure is 0 NOT performed to that wound X- 1 5 Simple Wound Assessment / Reassessment - one wound []$  - 0 Complex Wound Assessment / Reassessment - multiple wounds []$  - 0 Dermatologic / Skin Assessment (not related to wound area) ASSESSMENTS - Focused Assessment X- 1 5 Circumferential Edema Measurements - multi extremities []$  - 0 Nutritional Assessment / Counseling / Intervention []$  - 0 Lower Extremity Assessment (monofilament, tuning fork, pulses) []$  - 0 Peripheral Arterial Disease Assessment (using hand held doppler) ASSESSMENTS - Ostomy and/or Continence Assessment and Care []$  - 0 Incontinence Assessment and Management []$  - 0 Ostomy Care Assessment and Management (repouching, etc.) PROCESS  - Coordination of Care []$  - Points for Discharge Coordination can only be taken for a new wound of unknown or different etiology and a 0 procedure is NOT performed to that wound X- 1 15 Simple Patient / Family Education for ongoing care []$  - 0 Complex (extensive) Patient / Family Education for ongoing care X- 1 10 Staff obtains Consents, Records, T Results / Process Orders est []$  -  0 Staff telephones HHA, Nursing Homes / Clarify orders / etc []$  - 0 Routine Transfer to another Facility (non-emergent condition) []$  - 0 Routine Hospital Admission (non-emergent condition) X- 1 15 New Admissions / Biomedical engineer / Ordering NPWT Apligraf, etc. , []$  - 0 Emergency Hospital Admission (emergent condition) []$  - 0 Simple Discharge Coordination []$  - 0 Complex (extensive) Discharge Coordination PROCESS - [address] Jamie [name], Jamie [name] ([id number]) [id number] Page 3 of 8 []$  - 0 Pediatric / Minor Patient Management []$  - 0 Isolation Patient Management []$  - 0 Hearing / Language / Visual special needs []$  - 0 Assessment of Community assistance (transportation, D/C planning, etc.) []$  - 0 Additional assistance / Altered mentation []$  - 0 Support Surface(s) Assessment (bed, cushion, seat, etc.) INTERVENTIONS - Wound Cleansing / Measurement []$  - Points for Wound Cleaning / Measurement, Wound Dressing, Specimen Collection and Specimen taken to lab can 0 only be taken for a new wound of unknown or different etiology and a procedure is NOT performed to that wound X- 1 5 Simple Wound Cleansing - one wound []$  - 0 Complex Wound Cleansing - multiple wounds X- 1 5 Wound Imaging (photographs - any number of wounds) []$  - 0 Wound Tracing (instead of photographs) X- 1 5 Simple Wound Measurement - one wound []$  - 0 Complex Wound Measurement - multiple wounds INTERVENTIONS - Wound Dressings []$  - 0 Small Wound Dressing one or multiple wounds X- 1 15 Medium  Wound Dressing one or multiple wounds []$  - 0 Large Wound Dressing one or multiple wounds INTERVENTIONS - Miscellaneous []$  - 0 External ear exam X- 1 5 Specimen Collection (cultures, biopsies, blood, body fluids, etc.) X- 1 5 Specimen(s) / Culture(s) sent or taken to Lab for analysis []$  - 0 Patient Transfer (multiple staff / Civil Service fast streamer / Similar devices) []$  - 0 Simple Staple / Suture removal (25 or less) []$  - 0 Complex Staple / Suture removal (26 or more) []$  - 0 Hypo / Hyperglycemic Management (close monitor of Blood Glucose) X- 1 15 Ankle / Brachial Index (ABI) - do not check if billed separately X- 1 5 Vital Signs Has the patient been seen at the hospital within the last three years: Yes Total Score: 125 Level Of Care: New/Established - Level 4 Electronic Signature(s) Signed: 05/22/2022 3:48:00 PM By: Sharyn Creamer RN, BSN Entered By: Sharyn Creamer on 05/22/2022 15:44:57 -------------------------------------------------------------------------------- Compression Therapy Details Patient Name: Date of Service: Jamie [name], Jamie Dark [name]. 05/22/2022 1:45 PM Medical Record Number: [id number] Patient Account Number: [id number] Date of Birth/Sex: Treating RN: 02-Aug-1940 (82 y.o. Jamie [name] Primary Care Birt Reinoso: Ozella Rocks Other Clinician: Referring Oney Folz: Treating [name]/Extender: Alfredo Martinez, [name] in Treatment: 0 Compression Therapy Performed for Wound Assessment: Wound #5 Right,Lateral Lower Leg Performed By: Clinician Sharyn Creamer, RN Compression Type: [address] LOU, Jamie [name] ([id number]) [id number] Page 4 of 8 Post Procedure Diagnosis Same as Pre-procedure Electronic Signature(s) Signed: 05/22/2022 3:48:00 PM By: Sharyn Creamer RN, BSN Entered By: Sharyn Creamer on 05/22/2022 15:30:58 -------------------------------------------------------------------------------- Encounter  Discharge Information Details Patient Name: Date of Service: Jamie [name], Jamie Dark [name]. 05/22/2022 1:45 PM Medical Record Number: [id number] Patient Account Number: [id number] Date of Birth/Sex: Treating RN: 1940-08-08 (82 y.o. Jamie [name] Primary Care Tremeka Helbling: Ozella Rocks Other Clinician: Referring Kissa Campoy: Treating Yvon Mccord/Extender: Alfredo Martinez, [name] in Treatment: 0 Encounter Discharge Information Items Post Procedure Vitals Discharge Condition: Stable Temperature (F): 98.5 Ambulatory Status: Wheelchair Pulse (bpm): 77 Discharge Destination: Home Respiratory Rate (  breaths/min): 18 Transportation: Private Auto Blood Pressure (mmHg): 123/77 Accompanied By: neice Schedule Follow-up Appointment: Yes Clinical Summary of Care: Patient Declined Electronic Signature(s) Signed: 05/22/2022 3:48:00 PM By: Sharyn Creamer RN, BSN Entered By: Sharyn Creamer on 05/22/2022 15:46:49 -------------------------------------------------------------------------------- Lower Extremity Assessment Details Patient Name: Date of Service: Jamie [name], Jamie Marlin. 05/22/2022 1:45 PM Medical Record Number: [id number] Patient Account Number: [id number] Date of Birth/Sex: Treating RN: 05-09-1940 (82 y.o. Jamie [name] Primary Care Marte Celani: Ozella Rocks Other Clinician: Referring Askia Hazelip: Treating Larinda Herter/Extender: Alfredo Martinez, [name] in Treatment: 0 Edema Assessment Assessed: [Left: No] [Right: Yes] Edema: [Left: Ye] [Right: s] Calf Left: Right: Point of Measurement: From Medial Instep 35 cm Ankle Left: Right: Point of Measurement: From Medial Instep 24.2 cm Vascular Assessment Pulses: Dorsalis Jamie [name], Jamie [name] ([id number]) [id number] Page 5 of 8] Palpable: [Right:Yes] Blood Pressure: Brachial: [Right:123] Ankle: [Right:Dorsalis Pedis: 142 1.15] Electronic  Signature(s) Signed: 05/22/2022 3:48:00 PM By: Sharyn Creamer RN, BSN Entered By: Sharyn Creamer on 05/22/2022 14:25:27 -------------------------------------------------------------------------------- [place] Details Patient Name: Date of Service: Jamie [name], Jamie Dark [name]. 05/22/2022 1:45 PM Medical Record Number: [id number] Patient Account Number: [id number] Date of Birth/Sex: Treating RN: 1940-08-16 (82 y.o. 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 Compression as ordered Provide education on venous insufficiency Notes: Wound/Skin Impairment Nursing Diagnoses: Impaired tissue integrity Knowledge deficit related to ulceration/compromised skin integrity Goals: Patient/caregiver will verbalize understanding of skin care regimen Date Initiated: 05/22/2022 Target Resolution Date: 06/26/2022 Goal Status: Active Ulcer/skin breakdown will have a volume reduction of 30% by week 4 Date Initiated: 05/22/2022 Target Resolution Date: 06/26/2022 Goal Status: Active Interventions: Assess patient/caregiver ability to obtain necessary supplies Assess patient/caregiver ability to perform ulcer/skin care regimen upon admission and as needed Assess ulceration(s) every visit Provide education on ulcer  and skin care Notes: Electronic Signature(s) Jamie [name], Jamie [name] ([id number]) [phone].pdf Page 6 of 8 Signed: 05/22/2022 3:48:00 PM By: Sharyn Creamer RN, BSN Entered By: Sharyn Creamer on 05/22/2022 15:42:44 -------------------------------------------------------------------------------- Pain Assessment Details Patient Name: Date of Service: Jamie [name], Jamie Marlin. 05/22/2022 1:45 PM Medical Record Number: [id number] Patient Account Number: [id number] Date of Birth/Sex: Treating RN: 16-Feb-1941 (82 y.o. Jamie [name] Primary Care Francy Mcilvaine: Ozella Rocks Other Clinician: Referring Maximos Zayas: Treating Carlie Corpus/Extender: Alfredo Martinez, [name] in Treatment: 0 Active Problems Location of Pain Severity and Description of Pain Patient Has Paino Yes Site Locations Rate the pain. Current Pain Level: 5 Worst Pain Level: 10 Character of Pain Describe the Pain: Burning, Throbbing Pain Management and Medication Current Pain Management: Electronic Signature(s) Signed: 05/22/2022 3:48:00 PM By: Sharyn Creamer RN, BSN Entered By: Sharyn Creamer on 05/22/2022 14:21:35 -------------------------------------------------------------------------------- Patient/Caregiver Education Details Patient Name: Date of Service: Jamie [name] 2/14/2024andnbsp1:45 PM Medical Record Number: [id number] Patient Account Number: [id number] Date of Birth/Gender: Treating RN: Jul 15, 1940 (82 y.o. Jamie [name] Primary Care Physician: Ozella Rocks Other Clinician: Referring Physician: Treating Physician/Extender: Alfredo Martinez, Roseanne Reno in Treatment: 0 Education Assessment Jamie [name] (  [id number] Page 7 of 8 Education Provided To: Patient Education Topics Provided Venous: Methods: Explain/Verbal Responses: State content correctly Wound/Skin  Impairment: Methods: Explain/Verbal Responses: State content correctly Electronic Signature(s) Signed: 05/22/2022 3:48:00 PM By: Sharyn Creamer RN, BSN Entered By: Sharyn Creamer on 05/22/2022 15:43:13 -------------------------------------------------------------------------------- Wound Assessment Details Patient Name: Date of Service: Jamie [name], Jamie Dark [name]. 05/22/2022 1:45 PM Medical Record Number: [id number] Patient Account Number: [id number] Date of Birth/Sex: Treating RN: 16-Jul-1940 (82 y.o. Jamie [name] Primary Care Nuha Degner: Ozella Rocks Other Clinician: Referring Ambrielle Kington: Treating Veena Sturgess/Extender: Alfredo Martinez, [name] in Treatment: 0 Wound Status Wound Number: 5 Primary Trauma, Other Etiology: Wound Location: Right, Lateral Lower Leg Wound Open Wounding Event: Laceration Status: Date Acquired: 04/17/2022 Comorbid Sleep Apnea, Hypertension, Peripheral Venous Disease, Weeks Of Treatment: 0 History: Osteoarthritis, Neuropathy Clustered Wound: No Photos Wound Measurements Length: (cm) 6.8 Width: (cm) 4.9 Depth: (cm) 0.3 Area: (cm) 26.169 Volume: (cm) 7.851 % Reduction in Area: % Reduction in Volume: Epithelialization: None Tunneling: No Undermining: No Wound Description Classification: Full Thickness Without Exposed Support Exudate Amount: Medium Exudate Type: Serosanguineous Exudate Color: red, brown Structures Foul Odor After Cleansing: No Slough/Fibrino Yes Wound Bed Granulation Amount: Small (1-33%) Exposed Structure Granulation Quality: Red Fascia Exposed: No Jamie [name], Jamie [name] ([id number]) [id number] Page 8 of 8 Necrotic Amount: Large (67-100%) Fat Layer (Subcutaneous Tissue) Exposed: Yes Necrotic Quality: Eschar, Adherent Slough Tendon Exposed: No Muscle Exposed: No Joint Exposed: No Bone Exposed: No Periwound Skin Texture Texture Color No Abnormalities Noted: No No  Abnormalities Noted: No Moisture No Abnormalities Noted: No Electronic Signature(s) Signed: 05/22/2022 3:48:00 PM By: Sharyn Creamer RN, BSN Entered By: Sharyn Creamer on 05/22/2022 14:31:33 -------------------------------------------------------------------------------- Vitals Details Patient Name: Date of Service: Jamie [name], Jamie [name]. 05/22/2022 1:45 PM Medical Record Number: [id number] Patient Account Number: [id number] Date of Birth/Sex: Treating RN: July 18, 1940 (82 y.o. Jamie [name] Primary Care Codi Kertz: Ozella Rocks Other Clinician: Referring Excell Neyland: Treating Zakaiya Lares/Extender: Alfredo Martinez, [name] in Treatment: 0 Vital Signs Time Taken: 14:08 Temperature (F): 98.5 Height (in): 62 Pulse (bpm): 77 Source: Stated Respiratory Rate (breaths/min): 18 Weight (lbs): 171 Blood Pressure (mmHg): 123/77 Source: Stated Reference Range: 80 - 120 mg / dl Body Mass Index (BMI): 31.3 Electronic Signature(s) Signed: 05/22/2022 3:48:00 PM By: Sharyn Creamer RN, BSN Entered By: Sharyn Creamer on 05/22/2022 14:09:39

## 2022-05-23 NOTE — Progress Notes (Signed)
[name], [name] ([id number]) [id number] Page 1 of 4 Visit Report for 05/22/2022 Abuse Risk Screen Details Patient Name: Date of Service: Jamie [name], [place]. 05/22/2022 1:45 PM Medical Record Number: [id number] Patient Account Number: [id number] Date of Birth/Sex: Treating RN: 07-Sep-1940 (82 y.o. Donalda Ewings Primary Care Trenity Pha: Ozella Rocks Other Clinician: Referring Telissa Palmisano: Treating Pier Laux/Extender: Alfredo Martinez, [name] in Treatment: 0 Abuse Risk Screen Items Answer ABUSE RISK SCREEN: Has anyone close to you tried to hurt or harm you recentlyo No Do you feel uncomfortable with anyone in your familyo No Has anyone forced you do things that you didnt want to doo No Electronic Signature(s) Signed: 05/22/2022 3:48:00 PM By: Sharyn Creamer RN, BSN Entered By: Sharyn Creamer on 05/22/2022 14:13:02 -------------------------------------------------------------------------------- Activities of Daily Living Details Patient Name: Date of Service: Jamie [name]. 05/22/2022 1:45 PM Medical Record Number: [id number] Patient Account Number: [id number] Date of Birth/Sex: Treating RN: 23-Jul-1940 (82 y.o. Donalda Ewings Primary Care Larrie Fraizer: Ozella Rocks Other Clinician: Referring Toya Palacios: Treating Tommie Dejoseph/Extender: Alfredo Martinez, [name] in Treatment: 0 Activities of Daily Living Items Answer Activities of Daily Living (Please select one for each item) Drive Automobile Need Assistance T Medications ake Completely Able Use T elephone Completely Able Care for Appearance Completely Able Use T oilet Completely Able Bath / Shower Completely Able Dress Self Completely Able Feed Self Completely Able Walk Need Assistance Get In / Out Bed Completely Able Housework Need Assistance Prepare Meals Completely [place] for Self  Need Assistance Electronic Signature(s) Signed: 05/22/2022 3:48:00 PM By: Sharyn Creamer RN, BSN Entered By: Sharyn Creamer on 05/22/2022 14:13:50 Chryl Heck ([id number]) [id number] Page 2 of 4 -------------------------------------------------------------------------------- Education Screening Details Patient Name: Date of Service: Jamie [name], [place]. 05/22/2022 1:45 PM Medical Record Number: [id number] Patient Account Number: [id number] Date of Birth/Sex: Treating RN: [birth year] (82 y.o. Donalda Ewings Primary Care Jairus Tonne: Ozella Rocks Other Clinician: Referring Bianka Liberati: Treating Nimesh Riolo/Extender: Alfredo Martinez, Roseanne Reno in Treatment: 0 Learning Preferences/Education Level/Primary Language Learning Preference: Explanation, Demonstration, Printed Material Highest Education Level: College or Above Preferred Language: English Cognitive Barrier Language Barrier: No Translator Needed: No Memory Deficit: No Emotional Barrier: No Cultural/Religious Beliefs Affecting Medical Care: No Physical Barrier Impaired Vision: No Impaired Hearing: No Decreased Hand dexterity: No Knowledge/Comprehension Knowledge Level: High Comprehension Level: High Ability to understand written instructions: High Ability to understand verbal instructions: High Motivation Anxiety Level: Calm Cooperation: Cooperative Education Importance: Acknowledges Need Interest in Health Problems: Asks Questions Perception: Coherent Willingness to Engage in Self-Management High Activities: Readiness to Engage in Self-Management High Activities: Electronic Signature(s) Signed: 05/22/2022 3:48:00 PM By: Sharyn Creamer RN, BSN Entered By: Sharyn Creamer on 05/22/2022 14:15:00 -------------------------------------------------------------------------------- Fall Risk Assessment Details Patient Name: Date of Service: Jamie [name], Jamie Dark W. 05/22/2022 1:45 PM Medical Record Number: [id number] Patient Account Number: [id number] Date of Birth/Sex: Treating RN: 1940-12-25 (82 y.o. Donalda Ewings Primary Care Saara Kijowski: Ozella Rocks Other Clinician: Referring Manpreet Kemmer: Treating Bevin Das/Extender: Alfredo Martinez, [name] in Treatment: 0 Fall Risk Assessment Items Have you had 2 or more falls in the last 12 monthso 0 Yes Have you had any fall that resulted in injury in the last 472 Jamie [name] monthso 0 No ILVA, HIBDON ([id number]) [id number] Page 3 of 4 FALLS RISK SCREEN History of falling - immediate or within 3 months 0 No Secondary diagnosis (Do you have 2 or more medical diagnoseso)  0 No Ambulatory aid None/bed rest/wheelchair/nurse 0 No Crutches/cane/walker 15 Yes Furniture 0 No Intravenous therapy Access/Saline/Heparin Lock 0 No Gait/Transferring Normal/ bed rest/ wheelchair 0 Yes Weak (short steps with or without shuffle, stooped but able to lift head while walking, may seek 10 Yes support from furniture) Impaired (short steps with shuffle, may have difficulty arising from chair, head down, impaired 0 No balance) Mental Status Oriented to own ability 0 Yes Electronic Signature(s) Signed: 05/22/2022 3:48:00 PM By: Sharyn Creamer RN, BSN Entered By: Sharyn Creamer on 05/22/2022 14:15:53 -------------------------------------------------------------------------------- Foot Assessment Details Patient Name: Date of Service: Jamie [name], Jamie Dark W. 05/22/2022 1:45 PM Medical Record Number: [id number] Patient Account Number: [id number] Date of Birth/Sex: Treating RN: 11-11-1940 (82 y.o. Donalda Ewings Primary Care Cher Egnor: Ozella Rocks Other Clinician: Referring Kenecia Barren: Treating Taiylor Virden/Extender: Alfredo Martinez, [name] in Treatment: 0 Foot Assessment Items Site Locations + = Sensation present, - =  Sensation absent, C = Callus, U = Ulcer R = Redness, W = Warmth, M = Maceration, PU = Pre-ulcerative lesion F = Fissure, S = Swelling, D = Dryness Assessment Right: Left: Other Deformity: No No Prior Foot Ulcer: No No Prior Amputation: No No Charcot Joint: No No Ambulatory Status: GaitGARNETTE, [name] ([id number]) [id number] Page 4 of 4 Electronic Signature(s) Signed: 05/22/2022 3:48:00 PM By: Sharyn Creamer RN, BSN Entered By: Sharyn Creamer on 05/22/2022 14:18:40 -------------------------------------------------------------------------------- Nutrition Risk Screening Details Patient Name: Date of Service: Jamie [name], [place]. 05/22/2022 1:45 PM Medical Record Number: [id number] Patient Account Number: [id number] Date of Birth/Sex: Treating RN: Apr 08, 1941 (82 y.o. Donalda Ewings Primary Care Jaylend Reiland: Ozella Rocks Other Clinician: Referring Declan Mier: Treating Theresa Wedel/Extender: Alfredo Martinez, [name] in Treatment: 0 Height (in): 62 Weight (lbs): 171 Body Mass Index (BMI): 31.3 Nutrition Risk Screening Items Score Screening NUTRITION RISK SCREEN: I have an illness or condition that made me change the kind and/or amount of food I eat 0 No I eat fewer than two meals per day 3 Yes I eat few fruits and vegetables, or milk products 0 No I have three or more drinks of beer, liquor or wine almost every day 0 No I have tooth or mouth problems that make it hard for me to eat 0 No I don't always have enough money to buy the food I need 0 No I eat alone most of the time 0 No I take three or more different prescribed or over-the-counter drugs a day 1 Yes Without wanting to, I have lost or gained 10 pounds in the last six months 2 Yes I am not always physically able to shop, cook and/or feed myself 0 No Nutrition Protocols Good Risk Protocol Moderate Risk Protocol High Risk Proctocol Risk Level: High  Risk Score: 6 Electronic Signature(s) Signed: 05/22/2022 3:48:00 PM By: Sharyn Creamer RN, BSN Entered By: Sharyn Creamer on 05/22/2022 14:16:52

## 2022-05-26 LAB — AEROBIC CULTURE W GRAM STAIN (SUPERFICIAL SPECIMEN): Gram Stain: NONE SEEN

## 2022-05-29 ENCOUNTER — Encounter (HOSPITAL_BASED_OUTPATIENT_CLINIC_OR_DEPARTMENT_OTHER): Payer: Medicare HMO | Admitting: Physician Assistant

## 2022-05-29 DIAGNOSIS — L97812 Non-pressure chronic ulcer of other part of right lower leg with fat layer exposed: Secondary | ICD-10-CM | POA: Diagnosis not present

## 2022-05-29 NOTE — Progress Notes (Signed)
YESENA, GREENSTEIN ([id number]) [id number] Page 1 of 10 Visit Report for 05/22/2022 Chief Complaint Document Details Patient Name: Date of Service: Jamie [name], [place]. 05/22/2022 1:45 PM Medical Record Number: [id number] Patient Account Number: [id number] Date of Birth/Sex: Treating RN: [birth year] (82 y.o. F) Primary Care Provider: Ozella Rocks Other Clinician: Referring Provider: Treating Provider/Extender: Alfredo Martinez, [name] in Treatment: 0 Information Obtained from: Patient Chief Complaint Right LE Ulcer Electronic Signature(s) Signed: 05/22/2022 2:54:57 PM By: Worthy Keeler PA-C Entered By: Worthy Keeler on 05/22/2022 14:54:57 -------------------------------------------------------------------------------- Debridement Details Patient Name: Date of Service: Jamie [name], Mattie Marlin. 05/22/2022 1:45 PM Medical Record Number: [id number] Patient Account Number: [id number] Date of Birth/Sex: Treating RN: 09-Feb-1941 (82 y.o. Donalda Ewings Primary Care Provider: Ozella Rocks Other Clinician: Referring Provider: Treating Provider/Extender: Alfredo Martinez, [name] in Treatment: 0 Debridement Performed for Assessment: Wound #5 Right,Lateral Lower Leg Performed By: Physician Worthy Keeler, PA Debridement Type: Debridement Severity of Tissue Pre Debridement: Fat layer exposed Level of Consciousness (Pre-procedure): Awake and Alert Pre-procedure Verification/Time Out Yes - 14:55 Taken: Start Time: 15:00 Pain Control: Lidocaine 4% T opical Solution T Area Debrided (L x W): otal 6.8 (cm) x 4.9 (cm) = 33.32 (cm) Tissue and other material debrided: Non-Viable, Eschar, Slough, Slough Level: Non-Viable Tissue Debridement Description: Selective/Open Wound Instrument: Curette Specimen: Tissue Culture Number of Specimens T aken: 1 Bleeding: Minimum Hemostasis Achieved:  Pressure Procedural Pain: 0 Post Procedural Pain: 0 Response to Treatment: Procedure was tolerated well Level of Consciousness (Post- Awake and Alert procedure): Post Debridement Measurements of Total Wound Length: (cm) 6.8 Width: (cm) 4.9 Depth: (cm) 0.3 Volume: (cm) 7.851 Character of Wound/Ulcer Post Debridement: Improved REBBECCA, TOBER ([id number]) [id number] Page 2 of 10 Severity of Tissue Post Debridement: Fat layer exposed Post Procedure Diagnosis Same as Pre-procedure Notes Scribed for Jeri Cos, PA by Sharyn Creamer, RN Electronic Signature(s) Signed: 05/22/2022 3:48:00 PM By: Sharyn Creamer RN, BSN Signed: 05/22/2022 5:47:19 PM By: Worthy Keeler PA-C Entered By: Sharyn Creamer on 05/22/2022 15:07:33 -------------------------------------------------------------------------------- HPI Details Patient Name: Date of Service: Jamie Everts, MA [name] 05/22/2022 1:45 PM Medical Record Number: [id number] Patient Account Number: [id number] Date of Birth/Sex: Treating RN: [birth year] (82 y.o. F) Primary Care Provider: Ozella Rocks Other Clinician: Referring Provider: Treating Provider/Extender: Alfredo Martinez, [name] in Treatment: 0 History of Present Illness HPI Description: ADMISSION 06/30/2020 Jamie [name] is an 82 year old woman who lives in [place]. She is here with her niece for review of wounds on the left medial lower leg and ankle. These have apparently been present for over a year and she followed with Dr. Juliann Pulse at the [facility] in [place] for quite a period of time although it looks as though there was a initial consult wound from Dr. Ladona Horns on February 18 presumably there was therefore hiatus. At that point the wounds were described as being there for 3 months. She also tells me she was at the wound care center in [place] for a period of time with this. 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 She had venous studies that were negative for DVT ABIs on the right were 1.01 on the left 1.06. She has . had previous applications of puraply, compression which she does not tolerate very well. She has had several rounds of oral antibiotic therapy. She complains of unrelenting pain and she is seeing Dr. Lorriane Shire of pain  management apparently was on oxycodone but that did not help. She also had a skin biopsy done by Dr. Juliann Pulse although we do not have that result. She does not appear to have an arterial issue. I am not completely clear what she has been putting on the wounds lately. 3/31; this patient has a particularly nasty set of wounds on the left medial ankle in the middle of what looks to be hemosiderin deposition secondary to chronic venous insufficiency. She has a lot of pain followed by pain management. Dr. Ladona Horns apparently did a biopsy of something on the left leg last fall what I would like to get this result. She has a history of MRSA treatment. I do not believe she had reflux studies but she did have DVT rule out studies. Previous ABIs have not suggested arterial insufficiency 4/7; difficult wounds on the left medial ankle probably chronic venous insufficiency. With considerable effort on behalf of our case manager we were able to finally to speak to somebody at the hospital in [place] who indicated that no biopsy of this area have been done even though the patient describes this in some detail and is even able to point out where she thinks the biopsy was done. We have been using Sorbact. The PCR culture I did showed polymicrobial identification with Pseudomonas, staph aureus, Peptostreptococcus. All of this and low titers. Resistance genes identified were MRSA, staph virulence gene and tetracycline. We are going to send this to [facility] for a topical antibiotic which is something that we have had good success with recently in  large venous ulcers with a lot of purulent drainage. There would not be an easy oral alternative here possibly line escalated and ciprofloxacin if we need to use systemic antibiotic 4/15; difficult area on the left medial ankle. Most likely chronic venous insufficiency. I think she will probably need venous reflux study I think she had DVT rule outs but not venous reflux studies. We have not yet obtained the topical antibiotics. She has home health changing the dressing we have been using Sorbact for adherent fibrinous debris on the surface. Very difficult to remove 4/22; patient presents for 1 week follow-up. She has been using sore back under compression wraps and these are changed 3 times a week with home health. She also had [facility] antibiotics sent to her house and brought them in today. She has no complaints or issues today. 5/2; patient is here for follow-up. She has been using Sorbact under compression. Very painful wound. She has been using Keystone antibiotics. Not much improvement although the more medial part of the wound has cleaned up nicely and the larger part of the wound about 50% slough covered. Part of the issue here is that she had stays at both [facility] wound care center, [facility] wound care center and now [place]. Not sure if she has had venous reflux studies. As far as we are able to tell she did not have a biopsy. My notes state that she did not have venous reflux studies just DVT rule outs. 5/16; patient goes for venous reflux studies this afternoon. She says that wound was biopsied which sounds like punch biopsies by Dr. Tye Maryland we do not have these results. We are using Sorbact. Very difficult wound to debride 5/23; patient presents for 1 week follow-up. She reports tolerating the wraps well with sorbact underneath. She had ABIs and venous reflux studies done. She has no issues or complaints today. She denies acute signs of infection. 6/6; I have  reviewed the patient's vascular  studies. Wounds are on the left medial and posterior calf. She had significant reflux in the greater saphenous vein in the in the mid thigh, distal thigh knee and the small saphenous vein in the popliteal fossa. The vein diameters do not look too impressive though. She is going to see the vascular surgeon on Wednesday. She also had venous reflux in the right common femoral vein. She did not have any evidence of a DVT or SVT I am wondering whether there is an ablation procedure that would benefit her in the greater saphenous vein on the left. . She tells [place] that home health put the dressing on too tight and she took off 1 layer. The swelling in her left leg is a little worse as a result of this. Not much change in the wound measurements so the surface of the wound looks better JAMARIE, SEADER ([id number]) [id number] Page 3 of 10 Her arterial studies showed an ABI on the right of 1.14 with a triphasic waveform and a great toe pressure of 0.89. On the left her ABIs were noncompressible at 1.34 but with triphasic waveforms and a TBI of 0.97. Her greater toe pressure was 122. There was no evidence of significant bilateral arterial disease 6/20 patient went to see Dr. Doren Custard. He did not think she had significant arterial disease. In terms of her venous duplex on the right side there was no evidence of a DVT or SVT there was deep venous reflux involving the common femoral vein no superficial vein reflux on the left side there was no DVT or SVT there was no deep vein graft reflux there was some reflux in the greater saphenous vein from the mid thigh to the knee but the vein here was not dilated. He thought these were venous wounds he prescribed a compression pump but I am not sure who we ordered it from The patient has been approved for Apligraf. Still using silver collagen this week 7/5; Apligraf #1 7/19 Apligraf #2. Decent improvement in the condition of the wound bed.  Epithelialization distal 8/2 Apligraf #3. No issues or complaints. Denies signs of infection. 8/17 Apligraf #4. No issues or concerns. Some complaints of pruritus and the rash. Our intake nurse brought up the fact that she had previously indicated possible cotton layer sensitivity we will therefore use kerlix in the bottom layer of the compression 8/30; the patient comes in with the area on her left medial leg just about healed. There is a superficial area more towards the tibia and a smaller open area distally everything else is epithelialized. I do not think she requires another Apligraf 9/13; left medial leg is healed. 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 Her edema control is good Readmisstion: 05-22-2022 upon evaluation today patient presents for initial inspection here in our clinic concerning issues that she has been having with a wound of the right lateral lower extremity. This is an area of a previous skin graft she tells me. With that being said she unfortunately had a scrape on this that occurred around 10 January. Since that time she has noted that this has just continued to get bigger and bigger in her niece who is present with her today actually states that 2 weeks ago when she saw that it was significantly smaller than what she sees currently. Obviously this is of utmost concern as we do not want this to continue to get larger  when arrested and get moving in the right direction. Fortunately there does not appear to be any signs of systemic infection though locally I think we probably do have some infection present. I would obtain a culture to see what we have going on here and then we will subsequently see where things go going forward. Fortunately I think that she is in the right place at this point being at the wound center we can definitely do something to try to get this moving in the right direction. Patient does have  a history of chronic venous insufficiency as well as coronary artery disease. In the past she has done well with compression wraps on the start with a 3 layer wrap I think she is probably can end up going to a 4-layer at some point but we will see how things do over the next week. She has been using wound cleanser along with she tells me a zinc and topical but again I am not sure exactly what that was. Electronic Signature(s) Signed: 05/22/2022 3:12:51 PM By: Worthy Keeler PA-C Entered By: Worthy Keeler on 05/22/2022 15:12:51 -------------------------------------------------------------------------------- Physical Exam Details Patient Name: Date of Service: Jamie [name], [place]. 05/22/2022 1:45 PM Medical Record Number: [id number] Patient Account Number: [id number] Date of Birth/Sex: Treating RN: 12-Dec-1940 (82 y.o. F) Primary Care Provider: Ozella Rocks Other Clinician: Referring Provider: Treating Provider/Extender: Alfredo Martinez, [name] in Treatment: 0 Constitutional sitting or standing blood pressure is within target range for patient.. pulse regular and within target range for patient.Marland Kitchen respirations regular, non-labored and within target range for patient.Marland Kitchen temperature within target range for patient.. Chronically ill appearing but in no apparent acute distress. Eyes conjunctiva clear no eyelid edema noted. pupils equal round and reactive to light and accommodation. Ears, Nose, Mouth, and Throat no gross abnormality of ear auricles or external auditory canals. normal hearing noted during conversation. mucus membranes moist. Respiratory normal breathing without difficulty. Cardiovascular 1+ dorsalis pedis/posterior tibialis pulses. 2+ pitting edema of the bilateral lower extremities. Musculoskeletal normal gait and posture. no significant deformity or arthritic changes, no loss or range of motion, no clubbing. Psychiatric this patient is  able to make decisions and demonstrates good insight into disease process. Alert and Oriented x 3. pleasant and cooperative. Notes Upon inspection patient's wound bed actually showed signs of good granulation and some of the area although majority had some significant slough and biofilm buildup. I did actually perform debridement to clearway the necrotic debris she tolerated this debridement today with minimal pain and complication she was actually surprised by how little it hurt her previous experience was much more significant unfortunately. She tells me that overall she feels much better in [place] ([id number]) [phone].pdf Page 4 of 10 regard to the overall appearance and the feeling of the wound postdebridement. With that being said she was afraid she was going to end up having to go to the hospital fortunately that is not the case. Her ABIs were good measuring at 1.15 on the right today. Electronic Signature(s) Signed: 05/22/2022 3:13:41 PM By: Worthy Keeler PA-C Entered By: Worthy Keeler on 05/22/2022 15:13:41 -------------------------------------------------------------------------------- Physician Orders Details Patient Name: Date of Service: Jamie [name], Mattie Marlin. 05/22/2022 1:45 PM Medical Record Number: [id number] Patient Account Number: [id number] Date of Birth/Sex: Treating RN: November 04, 1940 (82 y.o. 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 Jeri Cos, PA - Wednesday Return A Anesthetic Wound #5 Right,Lateral Lower Leg (In clinic) Topical Lidocaine 4% applied to wound bed Bathing/ Shower/ Hygiene May shower with protection but do not get wound dressing(s) wet. Protect dressing(s) with water repellant cover (for example, large plastic bag) or a cast cover and may then take shower. - may purchase cast protector from [facility], [facility] or [employer] Edema Control - Lymphedema / SCD / Other Right Lower Extremity Elevate legs to the level of the heart or above for 30 minutes daily and/or when sitting for 3-4 times a day throughout the day. Avoid standing for long periods of time. Wound Treatment Wound #5 - Lower Leg Wound Laterality: Right, Lateral Cleanser: Soap and Water 1 x Per Week/30 Days Discharge Instructions: May shower and wash wound with dial antibacterial soap and water prior to dressing change. Peri-Wound Care: Sween Lotion (Moisturizing lotion) 1 x Per Week/30 Days Discharge Instructions: Apply moisturizing lotion as directed Topical: Gentamicin 1 x Per Week/30 Days Discharge Instructions: As directed by physician Prim Dressing: Hydrofera Blue Ready Transfer Foam, 4x5 (in/in) 1 x Per Week/30 Days ary Discharge Instructions: Apply to wound bed as instructed Secondary Dressing: ABD Pad, 8x10 1 x Per Week/30 Days Discharge Instructions: Apply over primary dressing as directed. Compression Wrap: ThreePress (3 layer compression wrap) 1 x Per Week/30 Days Discharge Instructions: Apply three layer compression as directed. Patient Medications llergies: gabapentin, tizanidine, amlodipine, niacin, aspirin, lisinopril, valsartan A Notifications Medication Indication [address] JANASHIA, HONEY ([id number]) [phone].pdf Page 5 of 10 prior to debridement 05/23/2022 lidocaine DOSE topical 4 % cream - cream topical once daily 05/22/2022 Bactrim  DS DOSE 1 - oral 800 mg-160 mg tablet - 1 tablet oral twice a day x 14 days Electronic Signature(s) Signed: 05/22/2022 5:47:19 PM By: Worthy Keeler PA-C Signed: 05/27/2022 4:17:27 PM By: Sharyn Creamer RN, BSN Previous Signature: 05/22/2022 3:48:00 PM Version By: Sharyn Creamer RN, BSN Previous Signature: 05/22/2022 3:36:26 PM Version By: Worthy Keeler PA-C Entered By: Sharyn Creamer on 05/22/2022 15:56:46 -------------------------------------------------------------------------------- Problem List Details Patient Name: Date of Service: Jamie [name], [name] 05/22/2022 1:45 PM Medical Record Number: [id number] Patient Account Number: [id number] Date of Birth/Sex: Treating RN: 1941-03-20 (82 y.o. F) Primary Care Provider: Ozella Rocks Other Clinician: Referring Provider: Treating Provider/Extender: Alfredo Martinez, [name] in Treatment: 0 Active Problems ICD-10 Encounter Code Description Active Date MDM Diagnosis I87.331 Chronic venous hypertension (idiopathic) with ulcer and inflammation of right 05/22/2022 No Yes lower extremity L97.812 Non-pressure chronic ulcer of other part of right lower leg with fat layer 05/22/2022 No Yes exposed I25.10 Atherosclerotic heart disease of native coronary artery without angina pectoris 05/22/2022 No Yes Inactive Problems Resolved Problems Electronic Signature(s) Signed: 05/22/2022 2:54:40 PM By: Worthy Keeler PA-C Entered By: Worthy Keeler on 05/22/2022 14:54:39 -------------------------------------------------------------------------------- Progress Note Details Patient Name: Date of Service: Jamie [name], [name] 05/22/2022 1:45 PM Medical Record Number: [id number] Patient Account Number: [id number] Date of Birth/Sex: Treating RN: 24-Jun-1940 (82 y.o. F) Primary Care Provider: Ozella Rocks Other Clinician: Referring Provider: Treating Provider/Extender: Alfredo Martinez,  [address] in Treatment: [address] NEKEISHA, RADLE ([id number]) [id number] Page  6 of 10 Subjective Chief Complaint Information obtained from Patient Right LE Ulcer History of Present Illness (HPI) ADMISSION 06/30/2020 Mrs. Clews is an 82 year old woman who lives in [place]. She is here with her niece for review of wounds on the left medial lower leg and ankle. These have apparently been present for over a year and she followed with Dr. Juliann Pulse at the [facility] in [place] for quite a period of time although it looks as though there was a initial consult wound from Dr. Ladona Horns on February 18 presumably there was therefore hiatus. At that point the wounds were described as being there for 3 months. She also tells me she was at the wound care center in [place] for a period of time with this. 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 She had venous studies that were negative for DVT ABIs on the right were 1.01 on the left 1.06. She has . had previous applications of puraply, compression which she does not tolerate very well. She has had several rounds of oral antibiotic therapy. She complains of unrelenting pain and she is seeing Dr. Crist Fat V of pain management apparently was on oxycodone but that did not help. She also had a skin biopsy done by Dr. Juliann Pulse although we do not have that result. She does not appear to have an arterial issue. I am not completely clear what she has been putting on the wounds lately. 3/31; this patient has a particularly nasty set of wounds on the left medial ankle in the middle of what looks to be hemosiderin deposition secondary to chronic venous insufficiency. She has a lot of pain followed by pain management. Dr. Ladona Horns apparently did a biopsy of something on the left leg last fall what I would like to get this result. She has a history of MRSA treatment. I do not believe she had  reflux studies but she did have DVT rule out studies. Previous ABIs have not suggested arterial insufficiency 4/7; difficult wounds on the left medial ankle probably chronic venous insufficiency. With considerable effort on behalf of our case manager we were able to finally to speak to somebody at the hospital in [place] who indicated that no biopsy of this area have been done even though the patient describes this in some detail and is even able to point out where she thinks the biopsy was done. We have been using Sorbact. The PCR culture I did showed polymicrobial identification with Pseudomonas, staph aureus, Peptostreptococcus. All of this and low titers. Resistance genes identified were MRSA, staph virulence gene and tetracycline. We are going to send this to [facility] for a topical antibiotic which is something that we have had good success with recently in large venous ulcers with a lot of purulent drainage. There would not be an easy oral alternative here possibly line escalated and ciprofloxacin if we need to use systemic antibiotic 4/15; difficult area on the left medial ankle. Most likely chronic venous insufficiency. I think she will probably need venous reflux study I think she had DVT rule outs but not venous reflux studies. We have not yet obtained the topical antibiotics. She has home health changing the dressing we have been using Sorbact for adherent fibrinous debris on the surface. Very difficult to remove 4/22; patient presents for 1 week follow-up. She has been using sore back under compression wraps and these are changed 3 times a week with home health. She also had [facility] antibiotics  sent to her house and brought them in today. She has no complaints or issues today. 5/2; patient is here for follow-up. She has been using Sorbact under compression. Very painful wound. She has been using [facility] antibiotics. Not much improvement although the more medial part of the wound has cleaned  up nicely and the larger part of the wound about 50% slough covered. Part of the issue here is that she had stays at both [facility] wound care center, [facility] wound care center and now [place]. Not sure if she has had venous reflux studies. As far as we are able to tell she did not have a biopsy. My notes state that she did not have venous reflux studies just DVT rule outs. 5/16; patient goes for venous reflux studies this afternoon. She says that wound was biopsied which sounds like punch biopsies by Dr. Tye Maryland we do not have these results. We are using Sorbact. Very difficult wound to debride 5/23; patient presents for 1 week follow-up. She reports tolerating the wraps well with sorbact underneath. She had ABIs and venous reflux studies done. She has no issues or complaints today. She denies acute signs of infection. 6/6; I have reviewed the patient's vascular studies. Wounds are on the left medial and posterior calf. She had significant reflux in the greater saphenous vein in the in the mid thigh, distal thigh knee and the small saphenous vein in the popliteal fossa. The vein diameters do not look too impressive though. She is going to see the vascular surgeon on Wednesday. She also had venous reflux in the right common femoral vein. She did not have any evidence of a DVT or SVT I am wondering whether there is an ablation procedure that would benefit her in the greater saphenous vein on the left. . She tells [place] that home health put the dressing on too tight and she took off 1 layer. The swelling in her left leg is a little worse as a result of this. Not much change in the wound measurements so the surface of the wound looks better Her arterial studies showed an ABI on the right of 1.14 with a triphasic waveform and a great toe pressure of 0.89. On the left her ABIs were noncompressible at 1.34 but with triphasic waveforms and a TBI of 0.97. Her greater toe pressure was 122. There was no evidence of  significant bilateral arterial disease 6/20 patient went to see Dr. Doren Custard. He did not think she had significant arterial disease. In terms of her venous duplex on the right side there was no evidence of a DVT or SVT there was deep venous reflux involving the common femoral vein no superficial vein reflux on the left side there was no DVT or SVT there was no deep vein graft reflux there was some reflux in the greater saphenous vein from the mid thigh to the knee but the vein here was not dilated. He thought these were venous wounds he prescribed a compression pump but I am not sure who we ordered it from The patient has been approved for Apligraf. Still using silver collagen this week 7/5; Apligraf #1 7/19 Apligraf #2. Decent improvement in the condition of the wound bed. Epithelialization distal 8/2 Apligraf #3. No issues or complaints. Denies signs of infection. 8/17 Apligraf #4. No issues or concerns. Some complaints of pruritus and the rash. Our intake nurse brought up the fact that she had previously indicated possible cotton layer sensitivity we will therefore use kerlix in the  bottom layer of the compression 8/30; the patient comes in with the area on her left medial leg just about healed. There is a superficial area more towards the tibia and a smaller open area distally everything else is epithelialized. I do not think she requires another Apligraf 9/13; left medial leg is healed. 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 Her edema control is good Readmisstion: 05-22-2022 upon evaluation today patient presents for initial inspection here in our clinic concerning issues that she has been having with a wound of the right lateral lower extremity. This is an area of a previous skin graft she tells me. With that being said she unfortunately had a scrape on this that occurred around 10 January. Since that time she has noted that this has  just continued to get bigger and bigger in her niece who is present with her today actually states that 2 weeks ago when she saw that it was significantly smaller than what she sees currently. Obviously this is of utmost concern as we do not want this to continue to get larger when arrested and get moving in the right direction. Fortunately there does not appear to be any signs of systemic infection though locally I think we probably do have some infection present. I would obtain a culture to see what we have going on here and then we will subsequently see where things go going forward. Fortunately I think that she is in the right place at this point being at the wound center we can definitely do something to try to get this moving in the right direction. Patient does have a history of chronic venous insufficiency as well as coronary artery disease. In the past she has done well with compression wraps on the start with a 3 layer wrap I think she is probably can end up going to a 4-layer at some point but we will see how things do over the next week. She has been using wound cleanser along with she tells me a zinc and topical but again I am not sure exactly what that was. AYRIANNA, MCDERMITT ([id number]) [id number] Page 7 of 10 Patient History Information obtained from Patient. Allergies gabapentin, tizanidine, amlodipine, niacin, aspirin, lisinopril, valsartan Family History Unknown History. Social History Former smoker, Marital Status - Divorced, Alcohol Use - Never, Drug Use - No History, Caffeine Use - Rarely. 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 Objective Constitutional sitting or standing blood pressure is within target range for patient.. pulse regular and within target range for patient.Marland Kitchen respirations regular, non-labored and within target range for patient.Marland Kitchen temperature within target range for patient.. Chronically ill appearing but in no apparent acute distress. Vitals Time Taken: 2:08 PM, Height: 62 in, Source: Stated, Weight: 171 lbs, Source: Stated, BMI: 31.3, Temperature: 98.5 F, Pulse: 77 bpm, Respiratory Rate: 18 breaths/min, Blood Pressure: 123/77 mmHg. Eyes conjunctiva clear no eyelid edema noted. pupils equal round and reactive to light and accommodation. Ears, Nose, Mouth, and Throat no gross abnormality of ear auricles or external auditory canals. normal hearing noted during conversation. mucus membranes moist. Respiratory normal breathing without difficulty. Cardiovascular  1+ dorsalis pedis/posterior tibialis pulses. 2+ pitting edema of the bilateral lower extremities. Musculoskeletal normal gait and posture. no significant deformity or arthritic changes, no loss or range of motion, no clubbing. Psychiatric this patient is able to make decisions and demonstrates good insight into disease process. Alert and Oriented x 3. pleasant and cooperative. General Notes: Upon inspection patient's wound bed actually showed signs of good granulation and some of the area although majority had some significant slough and biofilm buildup. I did actually perform debridement to clearway the necrotic debris she tolerated this debridement today with minimal pain and complication she was actually surprised by how little it hurt her previous experience was much more significant unfortunately. She tells me that overall she feels much better in regard to the overall appearance and the feeling of the wound  postdebridement. With that being said she was afraid she was going to end up having to go to the hospital fortunately that is not the case. Her ABIs were good measuring at 1.15 on the right today. Integumentary (Hair, Skin) Wound #5 status is Open. Original cause of wound was Laceration. The date acquired was: 04/17/2022. The wound is located on the Right,Lateral Lower Leg. The wound measures 6.8cm length x 4.9cm width x 0.3cm depth; 26.169cm^2 area and 7.851cm^3 volume. There is Fat Layer (Subcutaneous Tissue) exposed. There is no tunneling or undermining noted. There is a medium amount of serosanguineous drainage noted. There is small (1-33%) red granulation within the wound bed. There is a large (67-100%) amount of necrotic tissue within the wound bed including Eschar and Adherent Slough. 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 There was a Selective/Open Wound Non-Viable Tissue Debridement with a total area of 33.32 sq cm performed by Worthy Keeler, PA. With the following instrument(s): Curette to remove Non-Viable tissue/material. Material removed includes Eschar and Slough and after achieving pain control using Lidocaine 4% Topical Solution. 1 specimen was taken by a Tissue Culture and sent to the lab per facility protocol. A time out was conducted at 14:55, prior to the start of the procedure. A Minimum amount of bleeding was controlled with Pressure. The procedure was tolerated well  with a pain level of 0 throughout and a pain level of 0 following the procedure. Post Debridement Measurements: 6.8cm length x 4.9cm width x 0.3cm depth; 7.851cm^3 volume. Character of Wound/Ulcer Post Debridement is improved. Severity of Tissue Post Debridement is: Fat layer exposed. Post procedure Diagnosis Wound #5: Same as Pre-Procedure General Notes: Scribed for Jeri Cos, PA by Sharyn Creamer, RN. Pre-procedure diagnosis of Wound #5 is a Venous Leg Ulcer located on the Right,Lateral Lower Leg . There was a Three Layer Compression Therapy Procedure by Sharyn Creamer, RN. Post procedure Diagnosis Wound #5: Same as Pre-Procedure Plan Follow-up Appointments: Return Appointment in 1 week. Jeri Cos, PA - Wednesday Anesthetic: Wound #5 Right,Lateral Lower Leg: (In clinic) Topical Lidocaine 4% applied to wound bed Bathing/ Shower/ Hygiene: May shower with protection but do not get wound dressing(s) wet. Protect dressing(s) with water repellant cover (for example, large plastic bag) or a cast  cover and may then take shower. - may purchase cast protector from [facility], [facility] or [employer] Edema Control - Lymphedema / SCD / Other: Elevate legs to the level of the heart or above for 30 minutes daily and/or when sitting for 3-4 times a day throughout the day. Avoid standing for long periods of time. The following medication(s) was prescribed: lidocaine topical 4 % cream cream topical once daily for prior to debridement was prescribed at facility Bactrim DS oral 800 mg-160 mg tablet 1 1 tablet oral twice a day x 14 days starting 05/22/2022 WOUND #5: - Lower Leg Wound Laterality: Right, Lateral Cleanser: Soap and Water 1 x Per Week/30 Days Discharge Instructions: May shower and wash wound with dial antibacterial soap and water prior to dressing change. Peri-Wound Care: Sween Lotion (Moisturizing lotion) 1 x Per Week/30 Days Discharge Instructions: Apply moisturizing lotion as directed Topical:  Gentamicin 1 x Per Week/30 Days Discharge Instructions: As directed by physician Prim Dressing: Hydrofera Blue Ready Transfer Foam, 4x5 (in/in) 1 x Per Week/30 Days ary Discharge Instructions: Apply to wound bed as instructed Secondary Dressing: ABD Pad, 8x10 1 x Per Week/30 Days Discharge Instructions: Apply over primary dressing as directed. Com pression Wrap: ThreePress (3 layer compression wrap) 1 x Per Week/30 Days Discharge Instructions: Apply three layer compression as directed. 1. Based on what I am seeing I do believe that we should go ahead and see about getting the patient started on a course of Bactrim DS I think this is going to be a good option for her. I discussed that today and again is something that she does not think she has had any allergies to in the past and I think is a good skin antibiotic at this point. 2. I am also going to recommend that we go ahead and have the patient undergo a wound culture which I did send in postdebridement of the deepest part of the wound in order to evaluate for any signs of infection here and to ensure that we have the correct antibiotic. 3. I am also can recommend that we go ahead and initiate treatment with a compression wrap which I think should do well to help with controlling the edema as well. Hopefully this will see things turn around. 4. If she does not respond as quickly as we would like with the current measures always Apligraf as an option in the past she responded extremely well to this on the left leg I think it could also do well on this leg but were nowhere near that conversation quite yet. We will see patient back for reevaluation in 1 week here in the clinic. If anything worsens or changes patient will contact our office for additional recommendations. Electronic Signature(s) Signed: 05/28/2022 8:33:17 AM By: Worthy Keeler PA-C Signed: 05/28/2022 4:50:49 PM By: Deon Pilling RN, BSN Previous Signature: 05/22/2022 3:36:57 PM  Version By: Worthy Keeler PA-C Entered By: Deon Pilling on 05/28/2022 08:02:46 Chryl Heck ([id number]) [phone].pdf Page 9 of 10 -------------------------------------------------------------------------------- HxROS Details Patient Name: Date of Service: Jamie [name], [place]. 05/22/2022 1:45 PM Medical Record Number: [id number] Patient Account Number: [id number] Date of Birth/Sex: Treating RN: 1940-04-16 (82 y.o. Debby Bud Primary Care Provider: Ozella Rocks Other Clinician: Referring Provider: Treating Provider/Extender: Alfredo Martinez, [name] in Treatment: 0 Information Obtained From Patient Integumentary (Skin) Complaints and Symptoms: Positive for: Wounds - RLE Eyes Medical History: Past Medical History Notes: vein occulusion to right eye Respiratory Medical History: Positive  for: Sleep Apnea Cardiovascular Medical History: Positive for: Hypertension; Peripheral Venous Disease Past Medical History Notes: Heart murmur Musculoskeletal Medical History: Positive for: Osteoarthritis Neurologic Medical History: Positive for: Neuropathy Psychiatric Medical History: Past Medical History Notes: Anxiety, depression Immunizations Pneumococcal Vaccine: Received Pneumococcal Vaccination: Yes Received Pneumococcal Vaccination On or After 60th Birthday: No Implantable Devices None Family and Social History Unknown History: Yes; Former smoker; Marital Status - Divorced; Alcohol Use: Never; Drug Use: No History; Caffeine Use: Rarely; Financial Concerns: No; Food, Clothing or Shelter Needs: No; Support System Lacking: No; Transportation Concerns: No Electronic Signature(s) Signed: 05/22/2022 3:48:00 PM By: Sharyn Creamer RN, BSN Signed: 05/22/2022 5:47:19 PM By: Worthy Keeler PA-C Signed: 05/22/2022 5:48:24 PM By: Deon Pilling RN, BSN Entered By: Sharyn Creamer on 05/22/2022 14:12:50 Chryl Heck ([id number]) [phone].pdf Page 10 of 10 -------------------------------------------------------------------------------- SuperBill Details Patient Name: Date of Service: Jamie [name], [place]. 05/22/2022 Medical Record Number: [id number] Patient Account Number: [id number] Date of Birth/Sex: Treating RN: 01/09/1941 (82 y.o. F) Primary Care Provider: Ozella Rocks Other Clinician: Referring Provider: Treating Provider/Extender: Alfredo Martinez, [name] in Treatment: 0 Diagnosis Coding ICD-10 Codes Code Description [phone] Chronic venous hypertension (idiopathic) with ulcer and inflammation of right lower extremity 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d I25.10 Atherosclerotic heart disease of native coronary artery without angina pectoris Facility Procedures : CPT4 Code: [id number] Description: 99214 - WOUND CARE VISIT-LEV 4 EST PT Modifier: 25 Quantity: 1 : CPT4 Code: [id number] Description: [id number] - DEBRIDE WOUND 1ST 20 SQ CM OR < ICD-10 Diagnosis Description 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d Modifier: Quantity: 1 : CPT4 Code: [id number] Description: [id number] - DEBRIDE WOUND EA ADDL 20 SQ CM ICD-10 Diagnosis Description 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d Modifier: Quantity: 1 Physician Procedures : CPT4 Code Description Modifier [id number] - WC PHYS LEVEL 4 - EST PT 25 ICD-10 Diagnosis Description I87.331 Chronic venous hypertension (idiopathic) with ulcer and inflammation of right lower extremity L97.812 Non-pressure chronic ulcer of other  part of right lower leg with fat layer exposed I25.10 Atherosclerotic heart disease of native coronary artery without angina pectoris Quantity: 1 : [id number] - WC PHYS DEBR WO ANESTH 20 SQ CM ICD-10 Diagnosis  Description 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d Quantity: 1 : [id number] - WC PHYS DEBR WO ANESTH EA ADD 20 CM ICD-10 Diagnosis Description [id number] Non-pressure chronic ulcer of other part of right lower leg with fat layer exposed Quantity: 1 Electronic Signature(s) Signed: 05/22/2022 3:48:00 PM By: Sharyn Creamer RN, BSN Signed: 05/22/2022 5:47:19 PM By: Worthy Keeler PA-C Previous Signature: 05/22/2022 3:37:23 PM Version By: Worthy Keeler PA-C Entered By: Sharyn Creamer on 05/22/2022 15:45:11

## 2022-05-30 NOTE — Progress Notes (Addendum)
CARICIA, GILFORD ([id number]) [id number] Page 1 of 8 Visit Report for 05/29/2022 Chief Complaint Document Details Patient Name: Date of Service: Jamie [name], [place]. 05/29/2022 1:30 PM Medical Record Number: [id number] Patient Account Number: [id number] Date of Birth/Sex: Treating RN: 07/23/1940 (82 y.o. F) Primary Care Provider: Ozella Rocks Other Clinician: Referring Provider: Treating Provider/Extender: Alfredo Martinez, [name] in Treatment: 1 Information Obtained from: Patient Chief Complaint Bilateral LE Ulcers Electronic Signature(s) Signed: 05/29/2022 4:39:48 PM By: Worthy Keeler PA-C Previous Signature: 05/29/2022 1:41:27 PM Version By: Worthy Keeler PA-C Entered By: Worthy Keeler on 05/29/2022 16:39:48 -------------------------------------------------------------------------------- HPI Details Patient Name: Date of Service: Jamie [name], [name] 05/29/2022 1:30 PM Medical Record Number: [id number] Patient Account Number: [id number] Date of Birth/Sex: Treating RN: 03/08/1941 (82 y.o. F) Primary Care Provider: Ozella Rocks Other Clinician: Referring Provider: Treating Provider/Extender: Alfredo Martinez, [name] in Treatment: 1 History of Present Illness HPI Description: ADMISSION 06/30/2020 Mrs. Colflesh is an 82 year old woman who lives in [place]. She is here with her niece for review of wounds on the left medial lower leg and ankle. These have apparently been present for over a year and she followed with Dr. Juliann Pulse at the [facility] in [place] for quite a period of time although it looks as though there was a initial consult wound from Dr. Ladona Horns on February 18 presumably there was therefore hiatus. At that point the wounds were described as being there for 3 months. She also tells me she was at the wound care center in [place] for a period of time with  this. 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 She had venous studies that were negative for DVT ABIs on the right were 1.01 on the left 1.06. She has . had previous applications of puraply, compression which she does not tolerate very well. She has had several rounds of oral antibiotic therapy. She complains of unrelenting pain and she is seeing Dr. Crist Fat V of pain management apparently was on oxycodone but that did not help. She also had a skin biopsy done by Dr. Juliann Pulse although we do not have that result. She does not appear to have an arterial issue. I am not completely clear what she has been putting on the wounds lately. 3/31; this patient has a particularly nasty set of wounds on the left medial ankle in the middle of what looks to be hemosiderin deposition secondary to chronic venous insufficiency. She has a lot of pain followed by pain management. Dr. Ladona Horns apparently did a biopsy of something on the left leg last fall what I would like to get this result. She has a history of MRSA treatment. I do not believe she had reflux studies but she did have DVT rule out studies. Previous ABIs have not suggested arterial insufficiency 4/7; difficult wounds on the left medial ankle probably chronic venous insufficiency. With considerable effort on behalf of our case manager we were able to finally to speak to somebody at the hospital in [place] who indicated that no biopsy of this area have been done even though the patient describes this in some detail and is even able to point out where she thinks the biopsy was done. We have been using Sorbact. The PCR culture I did showed polymicrobial identification with Pseudomonas, staph aureus, Peptostreptococcus. All of this and low titers. Resistance genes identified were MRSA, staph virulence gene and tetracycline. We are going to  send this to [facility] for a topical antibiotic which is something that we have had  good success with recently in large venous ulcers with a lot of purulent drainage. There would not be an easy oral alternative here possibly line escalated and ciprofloxacin if we need to use systemic antibiotic 4/15; difficult area on the left medial ankle. Most likely chronic venous insufficiency. I think she will probably need venous reflux study I think she had DVT rule outs but not venous reflux studies. We have not yet obtained the topical antibiotics. She has home health changing the dressing we have been using Sorbact for adherent fibrinous debris on the surface. Very difficult to remove Jamie [name], Jamie [name] ([id number]) [id number] Page 2 of 8 4/22; patient presents for 1 week follow-up. She has been using sore back under compression wraps and these are changed 3 times a week with home health. She also had Keystone antibiotics sent to her house and brought them in today. She has no complaints or issues today. 5/2; patient is here for follow-up. She has been using Sorbact under compression. Very painful wound. She has been using Keystone antibiotics. Not much improvement although the more medial part of the wound has cleaned up nicely and the larger part of the wound about 50% slough covered. Part of the issue here is that she had stays at both [facility] wound care center, [facility] [place] [facility] wound care center and now [place]. Not sure if she has had venous reflux studies. As far as we are able to tell she did not have a biopsy. My notes state that she did not have venous reflux studies just DVT rule outs. 5/16; patient goes for venous reflux studies this afternoon. She says that wound was biopsied which sounds like punch biopsies by Dr. Tye Maryland we do not have these results. We are using Sorbact. Very difficult wound to debride 5/23; patient presents for 1 week follow-up. She reports tolerating the wraps well with sorbact underneath. She had ABIs and venous reflux studies done. She has  no issues or complaints today. She denies acute signs of infection. 6/6; I have reviewed the patient's vascular studies. Wounds are on the left medial and posterior calf. She had significant reflux in the greater saphenous vein in the in the mid thigh, distal thigh knee and the small saphenous vein in the popliteal fossa. The vein diameters do not look too impressive though. She is going to see the vascular surgeon on Wednesday. She also had venous reflux in the right common femoral vein. She did not have any evidence of a DVT or SVT I am wondering whether there is an ablation procedure that would benefit her in the greater saphenous vein on the left. . She tells [place] that home health put the dressing on too tight and she took off 1 layer. The swelling in her left leg is a little worse as a result of this. Not much change in the wound measurements so the surface of the wound looks better Her arterial studies showed an ABI on the right of 1.14 with a triphasic waveform and a great toe pressure of 0.89. On the left her ABIs were noncompressible at 1.34 but with triphasic waveforms and a TBI of 0.97. Her greater toe pressure was 122. There was no evidence of significant bilateral arterial disease 6/20 patient went to see Dr. Doren Custard. He did not think she had significant arterial disease. In terms of her venous duplex on the right side there was no evidence of a  DVT or SVT there was deep venous reflux involving the common femoral vein no superficial vein reflux on the left side there was no DVT or SVT there was no deep vein graft reflux there was some reflux in the greater saphenous vein from the mid thigh to the knee but the vein here was not dilated. He thought these were venous wounds he prescribed a compression pump but I am not sure who we ordered it from The patient has been approved for Apligraf. Still using silver collagen this week 7/5; Apligraf #1 7/19 Apligraf #2. Decent improvement in the  condition of the wound bed. Epithelialization distal 8/2 Apligraf #3. No issues or complaints. Denies signs of infection. 8/17 Apligraf #4. No issues or concerns. Some complaints of pruritus and the rash. Our intake nurse brought up the fact that she had previously indicated possible cotton layer sensitivity we will therefore use kerlix in the bottom layer of the compression 8/30; the patient comes in with the area on her left medial leg just about healed. There is a superficial area more towards the tibia and a smaller open area distally everything else is epithelialized. I do not think she requires another Apligraf 9/13; left medial leg is healed. 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 Her edema control is good Readmisstion: 05-22-2022 upon evaluation today patient presents for initial inspection here in our clinic concerning issues that she has been having with a wound of the right lateral lower extremity. This is an area of a previous skin graft she tells me. With that being said she unfortunately had a scrape on this that occurred around 10 January. Since that time she has noted that this has just continued to get bigger and bigger in her niece who is present with her today actually states that 2 weeks ago when she saw that it was significantly smaller than what she sees currently. Obviously this is of utmost concern as we do not want this to continue to get larger when arrested and get moving in the right direction. Fortunately there does not appear to be any signs of systemic infection though locally I think we probably do have some infection present. I would obtain a culture to see what we have going on here and then we will subsequently see where things go going forward. Fortunately I think that she is in the right place at this point being at the wound center we can definitely do something to try to get this moving in the right  direction. Patient does have a history of chronic venous insufficiency as well as coronary artery disease. In the past she has done well with compression wraps on the start with a 3 layer wrap I think she is probably can end up going to a 4-layer at some point but we will see how things do over the next week. She has been using wound cleanser along with she tells me a zinc and topical but again I am not sure exactly what that was. 05-29-2022 upon evaluation today patient appears to be doing well currently in regard to her wound. This actually is showing signs of improvement I am happy in that regard unfortunately her left leg has an area on the shin that has opened. This is the region where one of the areas at least we have previously taken care of. Nonetheless this is small hoping we get it under control before things worsen significantly. Electronic Signature(s)  Signed: 05/29/2022 4:38:04 PM By: Worthy Keeler PA-C Entered By: Worthy Keeler on 05/29/2022 16:38:04 -------------------------------------------------------------------------------- Physical Exam Details Patient Name: Date of Service: Jamie [name]. 05/29/2022 1:30 PM Medical Record Number: [id number] Patient Account Number: [id number] Date of Birth/Sex: Treating RN: 1940/08/05 (82 y.o. F) Primary Care Provider: Ozella Rocks Other Clinician: Referring Provider: Treating Provider/Extender: Alfredo Martinez, [name] in Treatment: 1 Constitutional Well-nourished and well-hydrated in no acute distress. Respiratory normal breathing without difficulty. Jamie [name], Jamie [name] ([id number]) [id number] Page 3 of 8 Psychiatric this patient is able to make decisions and demonstrates good insight into disease process. Alert and Oriented x 3. pleasant and cooperative. Notes Upon inspection patient's wound bed actually showed signs of good granulation epithelization at this  point. Fortunately I do not see any signs of infection though there is some slough and biofilm buildup Avoid debridement this week she is having quite a bit of discomfort I want to try to not cause too much discomfort but we may have to perform debridement next week and I discussed this with her. She is in agreement with the plan. Electronic Signature(s) Signed: 05/29/2022 4:38:32 PM By: Worthy Keeler PA-C Entered By: Worthy Keeler on 05/29/2022 16:38:32 -------------------------------------------------------------------------------- Physician Orders Details Patient Name: Date of Service: Jamie [name], [name] 05/29/2022 1:30 PM Medical Record Number: [id number] Patient Account Number: [id number] Date of Birth/Sex: Treating RN: 07/27/1940 (82 y.o. 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 Margarita Grizzle Wednesday 245pm 06/05/2022 Return A ppointment in 2 weeks. Margarita Grizzle Wednesday 215pm 06/12/2022 Return A Anesthetic Wound #5 Right,Lateral Lower Leg (In clinic) Topical Lidocaine 4% applied to wound bed Bathing/ Shower/ Hygiene May shower with protection but do not get wound dressing(s) wet. Protect dressing(s) with water repellant cover (for example, large plastic bag) or a cast cover and may then take shower. - may purchase cast protector from [facility], [facility] or [employer] Edema Control - Lymphedema / SCD / Other Right Lower Extremity Elevate legs to  the level of the heart or above for 30 minutes daily and/or when sitting for 3-4 times a day throughout the day. Avoid standing for long periods of time. Wound Treatment Wound #5 - Lower Leg Wound Laterality: Right, Lateral Cleanser: Soap and Water 1 x Per Week/30 Days Discharge Instructions: May shower and wash wound with dial antibacterial soap and water prior to dressing change. Peri-Wound Care: Sween Lotion (Moisturizing lotion) 1 x Per Week/30 Days Discharge Instructions: Apply moisturizing lotion as directed Topical: Gentamicin 1 x Per Week/30 Days Discharge Instructions: As directed by physician Prim Dressing: Hydrofera Blue Ready Transfer Foam, 4x5 (in/in) 1 x Per Week/30 Days ary Discharge Instructions: Apply to wound bed as instructed Secondary Dressing: ABD Pad, 8x10 1 x Per Week/30 Days Discharge Instructions: Apply over primary dressing as directed. Compression Wrap: ThreePress (3 layer compression wrap) 1 x Per Week/30 Days SUETTA, ZEIN ([id number]) [id number] Page 4 of 8 Discharge Instructions: Apply three layer compression as directed. Wound #6 - Lower Leg Wound Laterality: Left, Anterior Cleanser: Soap and Water 1 x Per  Week/30 Days Discharge Instructions: May shower and wash wound with dial antibacterial soap and water prior to dressing change. Peri-Wound Care: Sween Lotion (Moisturizing lotion) 1 x Per Week/30 Days Discharge Instructions: Apply moisturizing lotion as directed Topical: Gentamicin 1 x Per Week/30 Days Discharge Instructions: As directed by physician Prim Dressing: Hydrofera Blue Ready Transfer Foam, 4x5 (in/in) 1 x Per Week/30 Days ary Discharge Instructions: Apply to wound bed as instructed Secondary Dressing: ABD Pad, 8x10 1 x Per Week/30 Days Discharge Instructions: Apply over primary dressing as directed. Compression Wrap: ThreePress (3 layer compression wrap) 1 x Per Week/30 Days Discharge Instructions: Apply  three layer compression as directed. Electronic Signature(s) Signed: 05/29/2022 5:20:51 PM By: Deon Pilling RN, BSN Signed: 05/29/2022 5:24:38 PM By: Worthy Keeler PA-C Entered By: Deon Pilling on 05/29/2022 14:38:47 -------------------------------------------------------------------------------- Problem List Details Patient Name: Date of Service: Jamie [name], [name] 05/29/2022 1:30 PM Medical Record Number: [id number] Patient Account Number: [id number] Date of Birth/Sex: Treating RN: November 09, 1940 (82 y.o. F) Primary Care Provider: Ozella Rocks Other Clinician: Referring Provider: Treating Provider/Extender: Alfredo Martinez, [name] in Treatment: 1 Active Problems ICD-10 Encounter Code Description Active Date MDM Diagnosis I87.331 Chronic venous hypertension (idiopathic) with ulcer and inflammation of right 05/22/2022 No Yes lower extremity L97.812 Non-pressure chronic ulcer of other part of right lower leg with fat layer 05/22/2022 No Yes exposed L97.822 Non-pressure chronic ulcer of other part of left lower leg with fat layer exposed2/21/2024 No Yes I25.10 Atherosclerotic heart disease of native coronary artery without angina pectoris 05/22/2022 No Yes Inactive Problems Resolved Problems Electronic Signature(s) ANJELICA, BERRES ([id number]) [id number] Page 5 of 8 Signed: 05/29/2022 4:39:36 PM By: Worthy Keeler PA-C Previous Signature: 05/29/2022 1:41:15 PM Version By: Worthy Keeler PA-C Entered By: Worthy Keeler on 05/29/2022 16:39:36 -------------------------------------------------------------------------------- Progress Note Details Patient Name: Date of Service: Jamie [name], [name] 05/29/2022 1:30 PM Medical Record Number: [id number] Patient Account Number: [id number] Date of Birth/Sex: Treating RN: 1940-06-07 (82 y.o. F) Primary Care Provider: Ozella Rocks Other Clinician: Referring  Provider: Treating Provider/Extender: Alfredo Martinez, [name] in Treatment: 1 Subjective Chief Complaint Information obtained from Patient Bilateral LE Ulcers History of Present Illness (HPI) ADMISSION 06/30/2020 Mrs. Bainum is an 82 year old woman who lives in [place]. She is here with her niece for review of wounds on the left medial lower leg and ankle. These have apparently been present for over a year and she followed with Dr. Juliann Pulse at the [facility] in [place] for quite a period of time although it looks as though there was a initial consult wound from Dr. Ladona Horns on February 18 presumably there was therefore hiatus. At that point the wounds were described as being there for 3 months. She also tells me she was at the wound care center in [place] for a period of time with this. 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 She had venous studies that were negative for DVT ABIs on the right were 1.01 on the left 1.06. She has . had previous applications of puraply, compression which she does not tolerate very well. She has had several rounds of oral antibiotic therapy. She complains of unrelenting pain and she is seeing Dr. Crist Fat V of pain management apparently was on oxycodone but that did not help. She also had a skin biopsy done by Dr. Juliann Pulse although we do not have that result. She does not appear to have an  arterial issue. I am not completely clear what she has been putting on the wounds lately. 3/31; this patient has a particularly nasty set of wounds on the left medial ankle in the middle of what looks to be hemosiderin deposition secondary to chronic venous insufficiency. She has a lot of pain followed by pain management. Dr. Ladona Horns apparently did a biopsy of something on the left leg last fall what I would like to get this result. She has a history of MRSA treatment. I do not believe she had reflux  studies but she did have DVT rule out studies. Previous ABIs have not suggested arterial insufficiency 4/7; difficult wounds on the left medial ankle probably chronic venous insufficiency. With considerable effort on behalf of our case manager we were able to finally to speak to somebody at the hospital in [place] who indicated that no biopsy of this area have been done even though the patient describes this in some detail and is even able to point out where she thinks the biopsy was done. We have been using Sorbact. The PCR culture I did showed polymicrobial identification with Pseudomonas, staph aureus, Peptostreptococcus. All of this and low titers. Resistance genes identified were MRSA, staph virulence gene and tetracycline. We are going to send this to [facility] for a topical antibiotic which is something that we have had good success with recently in large venous ulcers with a lot of purulent drainage. There would not be an easy oral alternative here possibly line escalated and ciprofloxacin if we need to use systemic antibiotic 4/15; difficult area on the left medial ankle. Most likely chronic venous insufficiency. I think she will probably need venous reflux study I think she had DVT rule outs but not venous reflux studies. We have not yet obtained the topical antibiotics. She has home health changing the dressing we have been using Sorbact for adherent fibrinous debris on the surface. Very difficult to remove 4/22; patient presents for 1 week follow-up. She has been using sore back under compression wraps and these are changed 3 times a week with home health. She also had Keystone antibiotics sent to her house and brought them in today. She has no complaints or issues today. 5/2; patient is here for follow-up. She has been using Sorbact under compression. Very painful wound. She has been using Keystone antibiotics. Not much improvement although the more medial part of the wound has cleaned up  nicely and the larger part of the wound about 50% slough covered. Part of the issue here is that she had stays at both [facility] wound care center, [facility] wound care center and now [place]. Not sure if she has had venous reflux studies. As far as we are able to tell she did not have a biopsy. My notes state that she did not have venous reflux studies just DVT rule outs. 5/16; patient goes for venous reflux studies this afternoon. She says that wound was biopsied which sounds like punch biopsies by Dr. Tye Maryland we do not have these results. We are using Sorbact. Very difficult wound to debride 5/23; patient presents for 1 week follow-up. She reports tolerating the wraps well with sorbact underneath. She had ABIs and venous reflux studies done. She has no issues or complaints today. She denies acute signs of infection. 6/6; I have reviewed the patient's vascular studies. Wounds are on the left medial and posterior calf. She had significant reflux in the greater saphenous vein in the in the mid thigh, distal thigh knee and  the small saphenous vein in the popliteal fossa. The vein diameters do not look too impressive though. She is going to see the vascular surgeon on Wednesday. She also had venous reflux in the right common femoral vein. She did not have any evidence of a DVT or SVT I am wondering whether there is an ablation procedure that would benefit her in the greater saphenous vein on the left. . She tells [place] that home health put the dressing on too tight and she took off 1 layer. The swelling in her left leg is a little worse as a result of this. Not much change in the wound measurements so the surface of the wound looks better Her arterial studies showed an ABI on the right of 1.14 with a triphasic waveform and a great toe pressure of 0.89. On the left her ABIs were noncompressible at 1.34 but with triphasic waveforms and a TBI of 0.97. Her greater toe pressure was 122. There was no evidence of  significant bilateral arterial disease 6/20 patient went to see Dr. Doren Custard. He did not think she had significant arterial disease. In terms of her venous duplex on the right side there was no evidence of a DVT or SVT there was deep venous reflux involving the common femoral vein no superficial vein reflux on the left side there was no DVT or SVT there was no deep vein graft reflux there was some reflux in the greater saphenous vein from the mid thigh to the knee but the vein here was not dilated. He thought these were venous wounds he prescribed a compression pump but I am not sure who we ordered it from Jamie [name], Jamie [name] ([id number]) [id number] Page 6 of 8 The patient has been approved for Apligraf. Still using silver collagen this week 7/5; Apligraf #1 7/19 Apligraf #2. Decent improvement in the condition of the wound bed. Epithelialization distal 8/2 Apligraf #3. No issues or complaints. Denies signs of infection. 8/17 Apligraf #4. No issues or concerns. Some complaints of pruritus and the rash. Our intake nurse brought up the fact that she had previously indicated possible cotton layer sensitivity we will therefore use kerlix in the bottom layer of the compression 8/30; the patient comes in with the area on her left medial leg just about healed. There is a superficial area more towards the tibia and a smaller open area distally everything else is epithelialized. I do not think she requires another Apligraf 9/13; left medial leg is healed. 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 Her edema control is good Readmisstion: 05-22-2022 upon evaluation today patient presents for initial inspection here in our clinic concerning issues that she has been having with a wound of the right lateral lower extremity. This is an area of a previous skin graft she tells me. With that being said she unfortunately had a scrape  on this that occurred around 10 January. Since that time she has noted that this has just continued to get bigger and bigger in her niece who is present with her today actually states that 2 weeks ago when she saw that it was significantly smaller than what she sees currently. Obviously this is of utmost concern as we do not want this to continue to get larger when arrested and get moving in the right direction. Fortunately there does not appear to be any signs of systemic infection though locally I think we probably do have some infection present. I  would obtain a culture to see what we have going on here and then we will subsequently see where things go going forward. Fortunately I think that she is in the right place at this point being at the wound center we can definitely do something to try to get this moving in the right direction. Patient does have a history of chronic venous insufficiency as well as coronary artery disease. In the past she has done well with compression wraps on the start with a 3 layer wrap I think she is probably can end up going to a 4-layer at some point but we will see how things do over the next week. She has been using wound cleanser along with she tells me a zinc and topical but again I am not sure exactly what that was. 05-29-2022 upon evaluation today patient appears to be doing well currently in regard to her wound. This actually is showing signs of improvement I am happy in that regard unfortunately her left leg has an area on the shin that has opened. This is the region where one of the areas at least we have previously taken care of. Nonetheless this is small hoping we get it under control before things worsen significantly. Objective Constitutional Well-nourished and well-hydrated in no acute distress. Vitals Time Taken: 1:44 PM, Height: 62 in, Weight: 171 lbs, BMI: 31.3, Temperature: 98.0 F, Pulse: 58 bpm, Respiratory Rate: 18 breaths/min, Blood  Pressure: 107/63 mmHg. Respiratory normal breathing without difficulty. Psychiatric this patient is able to make decisions and demonstrates good insight into disease process. Alert and Oriented x 3. pleasant and cooperative. General Notes: Upon inspection patient's wound bed actually showed signs of good granulation epithelization at this point. Fortunately I do not see any signs of infection though there is some slough and biofilm buildup Avoid debridement this week she is having quite a bit of discomfort I want to try to not cause too much discomfort but we may have to perform debridement next week and I discussed this with her. She is in agreement with the plan. Integumentary (Hair, Skin) Wound #5 status is Open. Original cause of wound was Trauma. The date acquired was: 04/17/2022. The wound has been in treatment 1 weeks. The wound is located on the Right,Lateral Lower Leg. The wound measures 8cm length x 5.1cm width x 0.1cm depth; 32.044cm^2 area and 3.204cm^3 volume. There is Fat Layer (Subcutaneous Tissue) exposed. There is no tunneling or undermining noted. There is a medium amount of serosanguineous drainage noted. There is small (1-33%) red granulation within the wound bed. There is a large (67-100%) amount of necrotic tissue within the wound bed including Adherent Slough. The periwound skin appearance did not exhibit: Callus, Crepitus, Excoriation, Induration, Rash, Scarring, Dry/Scaly, Maceration, Atrophie Blanche, Cyanosis, Ecchymosis, Hemosiderin Staining, Mottled, Pallor, Rubor, Erythema. Wound #6 status is Open. Original cause of wound was Shear/Friction. The date acquired was: 05/22/2022. The wound is located on the Left,Anterior Lower Leg. The wound measures 0.6cm length x 0.7cm width x 0.1cm depth; 0.33cm^2 area and 0.033cm^3 volume. There is no tunneling or undermining noted. There is a medium amount of serosanguineous drainage noted. There is large (67-100%) red granulation  within the wound bed. There is a small (1-33%) amount of necrotic tissue within the wound bed including Eschar. 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 There was a Double Layer Compression Therapy Procedure by Deon Pilling, RN. Post procedure Diagnosis Wound #5: Same as Pre-Procedure Wound #6 Pre-procedure diagnosis of Wound #6 is a Venous Leg Ulcer located on the Left,Anterior Lower Leg . There was a Double Layer Compression Therapy Procedure by Deon Pilling, RN. Post procedure Diagnosis Wound #6: Same as Pre-Procedure Plan Follow-up Appointments: Return Appointment in 1 week. Margarita Grizzle Wednesday 245pm 06/05/2022 Return Appointment in 2 weeks. Margarita Grizzle Wednesday 215pm 06/12/2022 Anesthetic: Wound #5 Right,Lateral Lower Leg: (In clinic) Topical Lidocaine 4% applied to wound bed Bathing/ Shower/ Hygiene: May shower with protection but do not get wound dressing(s) wet. Protect dressing(s) with water repellant cover (for example, large plastic bag) or a cast cover and may then take shower. - may purchase cast protector from [facility], [facility] or [employer] Edema Control - Lymphedema / SCD / Other: Elevate legs to the level of the heart or above for 30 minutes daily and/or when sitting for 3-4 times a day throughout the day. Avoid standing for long periods of time. WOUND #5: - Lower Leg Wound Laterality: Right, Lateral Cleanser: Soap and Water 1 x Per Week/30 Days Discharge  Instructions: May shower and wash wound with dial antibacterial soap and water prior to dressing change. Peri-Wound Care: Sween Lotion (Moisturizing lotion) 1 x Per Week/30 Days Discharge Instructions: Apply moisturizing lotion as directed Topical: Gentamicin 1 x Per Week/30 Days Discharge Instructions: As directed by physician Prim Dressing: Hydrofera Blue Ready Transfer Foam, 4x5 (in/in) 1 x Per Week/30 Days ary Discharge Instructions: Apply to wound bed as instructed Secondary Dressing: ABD Pad, 8x10 1 x Per Week/30 Days Discharge Instructions: Apply over primary dressing as directed. Com pression Wrap: ThreePress (3 layer compression wrap) 1 x Per Week/30 Days Discharge Instructions: Apply three layer compression as directed. WOUND #6: - Lower Leg Wound Laterality: Left, Anterior Cleanser: Soap and Water 1 x Per Week/30 Days Discharge Instructions: May shower and wash wound with dial antibacterial soap and water prior to dressing change. Peri-Wound Care: Sween Lotion (Moisturizing lotion) 1 x Per Week/30 Days Discharge Instructions: Apply moisturizing lotion as directed Topical: Gentamicin 1 x Per Week/30 Days Discharge Instructions: As directed by physician Prim Dressing: Hydrofera Blue Ready Transfer Foam, 4x5 (in/in) 1 x Per Week/30 Days ary Discharge Instructions: Apply to wound bed as instructed Secondary Dressing: ABD Pad, 8x10 1 x Per Week/30 Days Discharge Instructions: Apply over primary dressing as directed. Com pression Wrap: ThreePress (3 layer compression wrap) 1 x Per Week/30 Days Discharge Instructions: Apply three layer compression as directed. 1. I would recommend currently that we have the patient continue to monitor for any signs of infection or worsening. Based on what we are seeing I do believe that she is making good progress. Continue with the gentamicin followed by the [facility]. 2. I am also can recommend we have the patient continue with 3 layer  compression wraps frequently doing this bilaterally. We will see patient back for reevaluation in 1 week here in the clinic. If anything worsens or changes patient will contact our office for additional recommendations. Electronic Signature(s) Signed: 05/29/2022 4:40:14 PM By: Worthy Keeler PA-C Previous Signature: 05/29/2022 4:38:55 PM Version By: Worthy Keeler PA-C  Entered By: Worthy Keeler on 05/29/2022 16:40:13 -------------------------------------------------------------------------------- SuperBill Details Patient Name: Date of Service: Jamie [name], [place] 05/29/2022 Medical Record Number: [id number] Patient Account Number: [id number] Date of Birth/Sex: Treating RN: 1940-12-07 (82 y.o. [address] [place], [place] ([id number]) [id number] Page 8 of 8 Primary Care Provider: Ozella Rocks Other Clinician: Referring Provider: Treating Provider/Extender: Alfredo Martinez, [name] in Treatment: 1 Diagnosis Coding ICD-10 Codes Code Description [phone] Chronic venous hypertension (idiopathic) with ulcer and inflammation of right lower extremity 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d L97.822 Non-pressure chronic ulcer of other part of left lower leg with fat layer exposed I25.10 Atherosclerotic heart disease of native coronary artery without angina pectoris Facility Procedures : CPT4: Code [id number] Description: 106 BILATERAL: Application of multi-layer venous compression system; leg (below knee), including ankle and t. Modifier: Quantity: 1 Physician Procedures : CPT4 Code Description Modifier [id number] - WC PHYS LEVEL 3 - EST PT ICD-10 Diagnosis Description I87.331 Chronic venous hypertension (idiopathic) with ulcer and inflammation of right lower extremity L97.812 Non-pressure chronic ulcer of other part  of right lower leg with fat layer exposed  I25.10 Atherosclerotic heart disease of native coronary artery without angina pectoris L97.822 Non-pressure chronic ulcer of other part of left lower leg with fat layer exposed Quantity: 1 Electronic Signature(s) Signed: 05/29/2022 4:40:49 PM By: Worthy Keeler PA-C Entered By: Worthy Keeler on 05/29/2022 16:40:48

## 2022-05-31 ENCOUNTER — Ambulatory Visit (HOSPITAL_BASED_OUTPATIENT_CLINIC_OR_DEPARTMENT_OTHER): Payer: Medicare Other | Admitting: General Surgery

## 2022-06-05 ENCOUNTER — Encounter (HOSPITAL_BASED_OUTPATIENT_CLINIC_OR_DEPARTMENT_OTHER): Payer: Medicare HMO | Admitting: Physician Assistant

## 2022-06-05 DIAGNOSIS — L97812 Non-pressure chronic ulcer of other part of right lower leg with fat layer exposed: Secondary | ICD-10-CM | POA: Diagnosis not present

## 2022-06-12 ENCOUNTER — Encounter (HOSPITAL_BASED_OUTPATIENT_CLINIC_OR_DEPARTMENT_OTHER): Payer: Medicare HMO | Attending: Internal Medicine | Admitting: Internal Medicine

## 2022-06-12 DIAGNOSIS — I872 Venous insufficiency (chronic) (peripheral): Secondary | ICD-10-CM | POA: Insufficient documentation

## 2022-06-12 DIAGNOSIS — L97812 Non-pressure chronic ulcer of other part of right lower leg with fat layer exposed: Secondary | ICD-10-CM | POA: Insufficient documentation

## 2022-06-12 DIAGNOSIS — I2510 Atherosclerotic heart disease of native coronary artery without angina pectoris: Secondary | ICD-10-CM | POA: Diagnosis not present

## 2022-06-12 DIAGNOSIS — Z8614 Personal history of Methicillin resistant Staphylococcus aureus infection: Secondary | ICD-10-CM | POA: Insufficient documentation

## 2022-06-12 DIAGNOSIS — I87331 Chronic venous hypertension (idiopathic) with ulcer and inflammation of right lower extremity: Secondary | ICD-10-CM | POA: Diagnosis not present

## 2022-06-12 DIAGNOSIS — L97822 Non-pressure chronic ulcer of other part of left lower leg with fat layer exposed: Secondary | ICD-10-CM | POA: Insufficient documentation

## 2022-06-12 NOTE — Progress Notes (Signed)
BEAUTIFULL, RIESBERG ([id number]) [id number] Page 1 of 6 Visit Report for 06/05/2022 Arrival Information Details Patient Name: Date of Service: Jamie [name], [place]. 06/05/2022 9:45 A M Medical Record Number: [id number] Patient Account Number: [id number] Date of Birth/Sex: Treating RN: [birth year] (82 y.o. Jamie [name] Primary Care Eliyah Bazzi: Ozella Rocks Other Clinician: Referring Oluwadarasimi Redmon: Treating Norah Devin/Extender: Alfredo Martinez, [name] in Treatment: 2 Visit Information History Since Last Visit Added or deleted any medications: No Patient Arrived: Wheel Chair Any new allergies or adverse reactions: No Arrival Time: 10:10 Had a fall or experienced change in No Accompanied By: daughter activities of daily living that may affect Transfer Assistance: None risk of falls: Patient Identification Verified: Yes Signs or symptoms of abuse/neglect since last visito No Secondary Verification Process Completed: Yes Hospitalized since last visit: No Implantable device outside of the clinic excluding No cellular tissue based products placed in the center since last visit: Has Dressing in Place as Prescribed: No Has Compression in Place as Prescribed: No Pain Present Now: Yes Electronic Signature(s) Signed: 06/11/2022 2:17:47 PM By: Sharyn Creamer RN, BSN Entered By: Sharyn Creamer on 06/05/2022 10:12:52 -------------------------------------------------------------------------------- Compression Therapy Details Patient Name: Date of Service: Jamie Rote [name]. 06/05/2022 9:45 A M Medical Record Number: [id number] Patient Account Number: [id number] Date of Birth/Sex: Treating RN: 12-06-1940 (82 y.o. Jamie [name] Primary Care Venola Castello: Ozella Rocks Other Clinician: Referring Ludwig Tugwell: Treating Alyas Creary/Extender: Alfredo Martinez, [name] in Treatment: 2 Compression Therapy  Performed for Wound Assessment: Wound #5 Right,Lateral Lower Leg Performed By: Clinician Sharyn Creamer, RN Compression Type: Rolena Infante Post Procedure Diagnosis Same as Pre-procedure Electronic Signature(s) Signed: 06/11/2022 2:17:47 PM By: Sharyn Creamer RN, BSN Entered By: Sharyn Creamer on 06/05/2022 10:46:17 -------------------------------------------------------------------------------- Compression Therapy Details Patient Name: Date of Service: Jamie [name]. 06/05/2022 9:45 A M Medical Record Number: [id number] Patient Account Number: [id number] Date of Birth/Sex: Treating RN: 1940-11-06 (82 y.o. Jamie [name] Primary Care Israel Wunder: Ozella Rocks Other Clinician: Referring Destanie Tibbetts: Treating Mell Mellott/Extender: Alfredo Martinez, [name] in Treatment: 2 Compression Therapy Performed for Wound Assessment: Wound #6 Left,Anterior Lower Leg Performed By: Clinician Sharyn Creamer, RN Compression Type: Rolena Infante Post Procedure Diagnosis Same as Jamie [name], Jamie [name] ([id number]) [id number] Page 2 of 6 Electronic Signature(s) Signed: 06/11/2022 2:17:47 PM By: Sharyn Creamer RN, BSN Entered By: Sharyn Creamer on 06/05/2022 10:46:17 -------------------------------------------------------------------------------- Encounter Discharge Information Details Patient Name: Date of Service: Jamie [name], Jamie Dark [name]. 06/05/2022 9:45 A M Medical Record Number: [id number] Patient Account Number: [id number] Date of Birth/Sex: Treating RN: [birth year] (82 y.o. Jamie [name] Primary Care Emiley Digiacomo: Ozella Rocks Other Clinician: Referring Anzal Bartnick: Treating Avaley Coop/Extender: Alfredo Martinez, Roseanne Reno in Treatment: 2 Encounter Discharge Information Items Discharge Condition: Stable Ambulatory Status: Wheelchair Discharge Destination: Home Transportation: Private Auto Accompanied By:  daughter Schedule Follow-up Appointment: Yes Clinical Summary of Care: Patient Declined Electronic Signature(s) Signed: 06/11/2022 2:17:47 PM By: Sharyn Creamer RN, BSN Entered By: Sharyn Creamer on 06/05/2022 11:03:13 -------------------------------------------------------------------------------- Lower Extremity Assessment Details Patient Name: Date of Service: Jamie [name]. 06/05/2022 9:45 A M Medical Record Number: [id number] Patient Account Number: [id number] Date of Birth/Sex: Treating RN: 1941-03-30 (82 y.o. Jamie [name] Primary Care Nylan Nevel: Ozella Rocks Other Clinician: Referring Dezerae Freiberger: Treating Carry Weesner/Extender: Alfredo Martinez, [name] in Treatment: 2 Edema Assessment Assessed: [Left: No] [Right: No] Edema: [Left: Ye] [Right: s] Calf Left: Right: Point of Measurement: From Medial Instep 36 cm 38 cm  Ankle Left: Right: Point of Measurement: From Medial Instep 22 cm 22.5 cm Vascular Assessment Pulses: Dorsalis Pedis Palpable: [Left:Yes] [Right:Yes] Electronic Signature(s) Signed: 06/11/2022 2:17:47 PM By: Sharyn Creamer RN, BSN Entered By: Sharyn Creamer on 06/05/2022 10:21:29 Jamie [name] Details -------------------------------------------------------------------------------- Jamie [name] ([id number]) [id number] Page 3 of 6 Patient Name: Date of Service: Jamie [name], [place] 06/05/2022 9:45 A M Medical Record Number: [id number] Patient Account Number: [id number] Date of Birth/Sex: Treating RN: Aug 10, 1940 (82 y.o. 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 Compression as ordered Provide education on venous insufficiency Notes: Wound/Skin Impairment Nursing Diagnoses: Impaired tissue integrity Knowledge deficit related to ulceration/compromised skin integrity Goals: Patient/caregiver will verbalize understanding of skin care regimen Date Initiated: 05/22/2022 Target Resolution Date: 06/26/2022 Goal Status: Active Ulcer/skin breakdown will have a volume reduction of 30% by week 4 Date Initiated: 05/22/2022 Target Resolution Date: 06/26/2022 Goal Status: Active Interventions: Assess patient/caregiver ability to obtain necessary supplies Assess patient/caregiver ability to perform ulcer/skin care regimen upon admission and as needed Assess ulceration(s) every visit Provide education on ulcer and skin care Notes: Electronic Signature(s) Signed: 06/11/2022 2:17:47 PM By: Sharyn Creamer RN, BSN Entered By: Sharyn Creamer on 06/05/2022 10:29:12 -------------------------------------------------------------------------------- Pain Assessment Details Patient Name: Date of Service: Jamie [name], Jamie Dark [name]. 06/05/2022 9:45 A M Medical Record Number: [id number] Patient Account Number: [id number] Date of Birth/Sex: Treating RN: [birth year] (82 y.o. Jamie [name] Primary Care Umi Mainor: Ozella Rocks Other Clinician: Referring Galileah Piggee: Treating Neve Branscomb/Extender: Alfredo Martinez, [name] in Treatment: 2 Active Problems Location of Pain Severity and Description of Pain Patient Has Paino Yes Site Locations Rate the pain. Jamie [name], Jamie [name] ([id number]) [id number] Page 4 of 6 Rate the pain. Current Pain Level: 7 Pain Management and Medication Current Pain Management: Electronic Signature(s) Signed: 06/11/2022 2:17:47 PM By: Sharyn Creamer RN, BSN Entered By: Sharyn Creamer on 06/05/2022 10:13:10 -------------------------------------------------------------------------------- Patient/Caregiver Education Details Patient Name: Date of Service: Jamie [name] 2/28/2024andnbsp9:45 [place] Record Number: [id number] Patient Account Number: [id number] Date of Birth/Gender: Treating RN: 09/09/1940 (82 y.o. Jamie [name] Primary Care Physician: Ozella Rocks Other Clinician: Referring Physician: Treating Physician/Extender: Alfredo Martinez, Roseanne Reno in Treatment: 2 Education Assessment Education Provided To: Patient Education Topics Provided Wound/Skin Impairment: Methods: Explain/Verbal Responses: State content correctly Electronic Signature(s) Signed: 06/11/2022 2:17:47 PM By: Sharyn Creamer RN, BSN Entered By: Sharyn Creamer on 06/05/2022 10:29:29 -------------------------------------------------------------------------------- Wound Assessment Details Patient Name: Date of Service: Jamie [name]. 06/05/2022 9:45 A M Medical Record Number: [id number] Patient Account Number: [id number] Date of Birth/Sex: Treating RN: 1940-05-10 (82 y.o. Jamie [name] Primary Care Alayshia Marini: Ozella Rocks Other Clinician: Referring Analiese Krupka: Treating Almina Schul/Extender: Alfredo Martinez, [name] in Treatment: 2 Wound Status Wound Number: 5 Primary Venous Leg Ulcer Etiology: Wound Location: Right, Lateral Lower Leg Wound Open Wounding Event: Trauma Status: Date Acquired: 04/17/2022 Comorbid Sleep Apnea, Hypertension, Peripheral Venous Disease, Weeks Of Treatment: 2 History: Osteoarthritis, Neuropathy Clustered Wound:  No Jamie [name], Jamie  [name] ([id number]) [id number] Page 5 of 6 Photos Wound Measurements Length: (cm) 8 Width: (cm) 5 Depth: (cm) 0.1 Area: (cm) 31.416 Volume: (cm) 3.142 % Reduction in Area: -20.1% % Reduction in Volume: 60% Epithelialization: None Tunneling: No Undermining: No Wound Description Classification: Full Thickness Without Exposed Support Exudate Amount: Medium Exudate Type: Serosanguineous Exudate Color: red, brown Structures Foul Odor After Cleansing: No Slough/Fibrino Yes Wound Bed Granulation Amount: Large (67-100%) Exposed Structure Granulation Quality: Red Fascia Exposed: No Necrotic Amount: Small (1-33%) Fat Layer (Subcutaneous Tissue) Exposed: Yes Necrotic Quality: Adherent Slough Tendon Exposed: No Muscle Exposed: No Joint Exposed: No Bone Exposed: No Periwound Skin Texture Texture Color No Abnormalities Noted: No No Abnormalities Noted: No Callus: No Atrophie Blanche: No Crepitus: No Cyanosis: No Excoriation: No Ecchymosis: No Induration: No Erythema: No Rash: No Hemosiderin Staining: No Scarring: No Mottled: No Pallor: No Moisture Rubor: No No Abnormalities Noted: No Dry / Scaly: No Maceration: No Electronic Signature(s) Signed: 06/11/2022 2:17:47 PM By: Sharyn Creamer RN, BSN Entered By: Sharyn Creamer on 06/05/2022 10:24:53 -------------------------------------------------------------------------------- Wound Assessment Details Patient Name: Date of Service: Jamie [name], Jamie Dark [name]. 06/05/2022 9:45 A M Medical Record Number: [id number] Patient Account Number: [id number] Date of Birth/Sex: Treating RN: [birth year] (82 y.o. Jamie [name] Primary Care Jayd Cadieux: Ozella Rocks Other Clinician: Referring Nan Maya: Treating Roselynn Whitacre/Extender: Alfredo Martinez, [name] in Treatment: 2 Wound Status Wound Number: 6 Primary Venous Leg Ulcer Etiology: Wound Location: Left,  Anterior Lower Leg Wound Open Wounding Event: Shear/Friction Status: Date Acquired: 05/22/2022 Comorbid Sleep Apnea, Hypertension, Peripheral Venous Disease, Weeks Of Treatment: 1 Jamie [name], Jamie [name] ([id number]) [phone].pdf Page 6 of 6 Weeks Of Treatment: 1 History: Osteoarthritis, Neuropathy Clustered Wound: No Photos Wound Measurements Length: (cm) 0.5 Width: (cm) 0.6 Depth: (cm) 0.1 Area: (cm) 0.236 Volume: (cm) 0.024 % Reduction in Area: 28.5% % Reduction in Volume: 27.3% Epithelialization: Small (1-33%) Tunneling: No Undermining: No Wound Description Classification: Partial Thickness Exudate Amount: Medium Exudate Type: Serosanguineous Exudate Color: red, brown Foul Odor After Cleansing: No Slough/Fibrino Yes Wound Bed Granulation Amount: Large (67-100%) Exposed Structure Granulation Quality: Red Fat Layer (Subcutaneous Tissue) Exposed: Yes Necrotic Amount: Small (1-33%) Necrotic Quality: Adherent Slough Periwound Skin Texture Texture Color No Abnormalities Noted: No No Abnormalities Noted: No Moisture No Abnormalities Noted: No Electronic Signature(s) Signed: 06/11/2022 2:17:47 PM By: Sharyn Creamer RN, BSN Entered By: Sharyn Creamer on 06/05/2022 10:25:35 -------------------------------------------------------------------------------- Vitals Details Patient Name: Date of Service: Jamie Everts, Jamie RGUERITE [name]. 06/05/2022 9:45 A M Medical Record Number: [id number] Patient Account Number: [id number] Date of Birth/Sex: Treating RN: 01-07-1941 (82 y.o. Jamie [name] Primary Care Lova Urbieta: Ozella Rocks Other Clinician: Referring Carisha Kantor: Treating Keneth Borg/Extender: Alfredo Martinez, [name] in Treatment: 2 Vital Signs Time Taken: 10:14 Temperature (F): 98.6 Height (in): 62 Pulse (bpm): 66 Weight (lbs): 171 Respiratory Rate (breaths/min): 18 Body Mass Index (BMI): 31.3 Blood Pressure (mmHg):  111/68 Reference Range: 80 - 120 mg / dl Electronic Signature(s) Signed: 06/11/2022 2:17:47 PM By: Sharyn Creamer RN, BSN Entered By: Sharyn Creamer on 06/05/2022 10:14:36

## 2022-06-12 NOTE — Progress Notes (Signed)
Jamie, [name] ([id number]) [id number] Page 1 of 8 Visit Report for 06/05/2022 Chief Complaint Document Details Patient Name: Date of Service: Jamie [name], [place]. 06/05/2022 9:45 A M Medical Record Number: [id number] Patient Account Number: [id number] Date of Birth/Sex: Treating RN: 18-Feb-1941 (82 y.o. F) Primary Care Provider: Ozella Rocks Other Clinician: Referring Provider: Treating Provider/Extender: Alfredo Martinez, [name] in Treatment: 2 Information Obtained from: Patient Chief Complaint Bilateral LE Ulcers Electronic Signature(s) Signed: 06/05/2022 10:43:42 AM By: Worthy Keeler PA-C Entered By: Worthy Keeler on 06/05/2022 10:43:42 -------------------------------------------------------------------------------- HPI Details Patient Name: Date of Service: Jamie [name], Jamie Dark W. 06/05/2022 9:45 A M Medical Record Number: [id number] Patient Account Number: [id number] Date of Birth/Sex: Treating RN: Jun 22, 1940 (82 y.o. F) Primary Care Provider: Ozella Rocks Other Clinician: Referring Provider: Treating Provider/Extender: Alfredo Martinez, [name] in Treatment: 2 History of Present Illness HPI Description: ADMISSION 06/30/2020 Jamie [name] is an 82 year old woman who lives in [place]. She is here with her niece for review of wounds on the left medial lower leg and ankle. These have apparently been present for over a year and she followed with Dr. Juliann Pulse at the [facility] in [place] for quite a period of time although it looks as though there was a initial consult wound from Dr. Ladona Horns on February 18 presumably there was therefore hiatus. At that point the wounds were described as being there for 3 months. She also tells me she was at the wound care center in [place] for a period of time with this. 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 She had venous studies that were negative for DVT ABIs on the right were 1.01 on the left 1.06. She has . had previous applications of puraply, compression which she does not tolerate very well. She has had several rounds of oral antibiotic therapy. She complains of unrelenting pain and she is seeing Dr. Crist Fat V of pain management apparently was on oxycodone but that did not help. She also had a skin biopsy done by Dr. Juliann Pulse although we do not have that result. She does not appear to have an arterial issue. I am not completely clear what she has been putting on the wounds lately. 3/31; this patient has a particularly nasty set of wounds on the left medial ankle in the middle of what looks to be hemosiderin deposition secondary to chronic venous insufficiency. She has a lot of pain followed by pain management. Dr. Ladona Horns apparently did a biopsy of something on the left leg last fall what I would like to get this result. She has a history of MRSA treatment. I do not believe she had reflux studies but she did have DVT rule out studies. Previous ABIs have not suggested arterial insufficiency 4/7; difficult wounds on the left medial ankle probably chronic venous insufficiency. With considerable effort on behalf of our case manager we were able to finally to speak to somebody at the hospital in [place] who indicated that no biopsy of this area have been done even though the patient describes this in some detail and is even able to point out where she thinks the biopsy was done. We have been using Sorbact. The PCR culture I did showed polymicrobial identification with Pseudomonas, staph aureus, Peptostreptococcus. All of this and low titers. Resistance genes identified were MRSA, staph virulence gene and tetracycline. We are going to send this to [facility] for a topical antibiotic which  is something that we have had good success with recently in large venous ulcers with a lot of purulent  drainage. There would not be an easy oral alternative here possibly line escalated and ciprofloxacin if we need to use systemic antibiotic 4/15; difficult area on the left medial ankle. Most likely chronic venous insufficiency. I think she will probably need venous reflux study I think she had DVT rule outs but not venous reflux studies. We have not yet obtained the topical antibiotics. She has home health changing the dressing we have been using Sorbact for adherent fibrinous debris on the surface. Very difficult to remove 4/22; patient presents for 1 week follow-up. She has been using sore back under compression wraps and these are changed 3 times a week with home health. She also had [facility] antibiotics sent to her house and brought them in today. She has no complaints or issues today. 5/2; patient is here for follow-up. She has been using Sorbact under compression. Very painful wound. She has been using Keystone antibiotics. Not much improvement although the more medial part of the wound has cleaned up nicely and the larger part of the wound about 50% slough covered. Part of the issue here is that she had stays at both [facility] wound care center, [facility] wound care center and now [place]. Not sure if she has had venous reflux studies. As far as we are able to tell she did not have a biopsy. My notes state that she did not have venous reflux studies just DVT rule outs. 5/16; patient goes for venous reflux studies this afternoon. She says that wound was biopsied which sounds like punch biopsies by Dr. Tye Maryland we do not have these results. We are using Sorbact. Very difficult wound to debride 5/23; patient presents for 1 week follow-up. She reports tolerating the wraps well with sorbact underneath. She had ABIs and venous reflux studies done. She [name], Jamie ([id number]) [id number] Page 2 of 8 has no issues or complaints today. She denies acute signs of infection. 6/6;  I have reviewed the patient's vascular studies. Wounds are on the left medial and posterior calf. She had significant reflux in the greater saphenous vein in the in the mid thigh, distal thigh knee and the small saphenous vein in the popliteal fossa. The vein diameters do not look too impressive though. She is going to see the vascular surgeon on Wednesday. She also had venous reflux in the right common femoral vein. She did not have any evidence of a DVT or SVT I am wondering whether there is an ablation procedure that would benefit her in the greater saphenous vein on the left. . She tells [place] that home health put the dressing on too tight and she took off 1 layer. The swelling in her left leg is a little worse as a result of this. Not much change in the wound measurements so the surface of the wound looks better Her arterial studies showed an ABI on the right of 1.14 with a triphasic waveform and a great toe pressure of 0.89. On the left her ABIs were noncompressible at 1.34 but with triphasic waveforms and a TBI of 0.97. Her greater toe pressure was 122. There was no evidence of significant bilateral arterial disease 6/20 patient went to see Dr. Doren Custard. He did not think she had significant arterial disease. In terms of her venous duplex on the right side there was no evidence of a DVT or SVT there was deep venous reflux involving  the common femoral vein no superficial vein reflux on the left side there was no DVT or SVT there was no deep vein graft reflux there was some reflux in the greater saphenous vein from the mid thigh to the knee but the vein here was not dilated. He thought these were venous wounds he prescribed a compression pump but I am not sure who we ordered it from The patient has been approved for Apligraf. Still using silver collagen this week 7/5; Apligraf #1 7/19 Apligraf #2. Decent improvement in the condition of the wound bed. Epithelialization distal 8/2 Apligraf #3. No issues  or complaints. Denies signs of infection. 8/17 Apligraf #4. No issues or concerns. Some complaints of pruritus and the rash. Our intake nurse brought up the fact that she had previously indicated possible cotton layer sensitivity we will therefore use kerlix in the bottom layer of the compression 8/30; the patient comes in with the area on her left medial leg just about healed. There is a superficial area more towards the tibia and a smaller open area distally everything else is epithelialized. I do not think she requires another Apligraf 9/13; left medial leg is healed. 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 Her edema control is good Readmisstion: 05-22-2022 upon evaluation today patient presents for initial inspection here in our clinic concerning issues that she has been having with a wound of the right lateral lower extremity. This is an area of a previous skin graft she tells me. With that being said she unfortunately had a scrape on this that occurred around 10 January. Since that time she has noted that this has just continued to get bigger and bigger in her niece who is present with her today actually states that 2 weeks ago when she saw that it was significantly smaller than what she sees currently. Obviously this is of utmost concern as we do not want this to continue to get larger when arrested and get moving in the right direction. Fortunately there does not appear to be any signs of systemic infection though locally I think we probably do have some infection present. I would obtain a culture to see what we have going on here and then we will subsequently see where things go going forward. Fortunately I think that she is in the right place at this point being at the wound center we can definitely do something to try to get this moving in the right direction. Patient does have a history of chronic venous insufficiency as well as  coronary artery disease. In the past she has done well with compression wraps on the start with a 3 layer wrap I think she is probably can end up going to a 4-layer at some point but we will see how things do over the next week. She has been using wound cleanser along with she tells me a zinc and topical but again I am not sure exactly what that was. 05-29-2022 upon evaluation today patient appears to be doing well currently in regard to her wound. This actually is showing signs of improvement I am happy in that regard unfortunately her left leg has an area on the shin that has opened. This is the region where one of the areas at least we have previously taken care of. Nonetheless this is small hoping we get it under control before things worsen significantly. 06-05-2022 upon evaluation today patient appears to be doing well currently  in regard to her wounds. The actually seem to be fairly clean she is having still quite a bit of problem with pain at this point she would like to not have debridement today for all possible. With that being said I do believe that we are making some good progress I think the [facility] is doing a decent job here. Electronic Signature(s) Signed: 06/05/2022 1:44:03 PM By: Worthy Keeler PA-C Entered By: Worthy Keeler on 06/05/2022 13:44:02 -------------------------------------------------------------------------------- Physical Exam Details Patient Name: Date of Service: Jamie [name]. 06/05/2022 9:45 A M Medical Record Number: [id number] Patient Account Number: [id number] Date of Birth/Sex: Treating RN: 07-01-1940 (82 y.o. F) Primary Care Provider: Ozella Rocks Other Clinician: Referring Provider: Treating Provider/Extender: Alfredo Martinez, [name] in Treatment: 2 Constitutional Well-nourished and well-hydrated in no acute distress. Respiratory normal breathing without difficulty. Psychiatric this patient is able  to make decisions and demonstrates good insight into disease process. Alert and Oriented x 3. pleasant and cooperative. Notes Upon inspection patient's wound bed actually showed signs of good granulation and epithelization at this point. Fortunately I do not see any evidence of active infection locally or systemically which is great and overall I am extremely pleased with where we stand. The patient is in my opinion on the right track here. Electronic Signature(s) Signed: 06/05/2022 1:44:29 PM By: Worthy Keeler PA-C Entered By: Worthy Keeler on 06/05/2022 13:44:29 Chryl Heck ([id number]) [id number] Page 3 of 8 -------------------------------------------------------------------------------- Physician Orders Details Patient Name: Date of Service: Jamie [name], [place]. 06/05/2022 9:45 A M Medical Record Number: [id number] Patient Account Number: [id number] Date of Birth/Sex: Treating RN: [birth year] (82 y.o. Jamie [name] Primary Care Provider: Ozella Rocks Other Clinician: Referring Provider: Treating Provider/Extender: Alfredo Martinez, [name] in Treatment: 2 Verbal / Phone Orders: No Diagnosis Coding Follow-up Appointments ppointment in 1 week. Margarita Grizzle Wednesday 215pm 06/12/2022 Return A ppointment in 2 weeks. Margarita Grizzle Wednesday Return A Anesthetic Wound #5 Right,Lateral Lower Leg (In clinic) Topical Lidocaine 4% applied to wound bed Bathing/ Shower/ Hygiene May shower with protection but do not get wound dressing(s) wet. Protect dressing(s) with water repellant cover (for example, large plastic bag) or a cast cover and may then take shower. - may purchase cast protector from [facility], [facility] or [employer] Edema Control - Lymphedema / SCD / Other Right Lower Extremity Elevate legs to the level of the heart or above for 30 minutes daily and/or when sitting for 3-4 times a day throughout the day. Avoid standing for  long periods of time. Wound Treatment Wound #5 - Lower Leg Wound Laterality: Right, Lateral Cleanser: Soap and Water 1 x Per Week/30 Days Discharge Instructions: May shower and wash wound with dial antibacterial soap and water prior to dressing change. Peri-Wound Care: Sween Lotion (Moisturizing lotion) 1 x Per Week/30 Days Discharge Instructions: Apply moisturizing lotion as directed Topical: Gentamicin 1 x Per Week/30 Days Discharge Instructions: As directed by physician Prim Dressing: Hydrofera Blue Ready Transfer Foam, 4x5 (in/in) 1 x Per Week/30 Days ary Discharge Instructions: Apply to wound bed as instructed Secondary Dressing: ABD Pad, 8x10 1 x Per Week/30 Days Discharge Instructions: Apply over primary dressing as directed. Compression Wrap: Kerlix Roll 4.5x3.1 (in/yd) 1 x Per Week/30 Days Discharge Instructions: Apply Kerlix and Coban compression as directed. Compression Wrap: Coban Self-Adherent Wrap 4x5 (in/yd) 1 x Per Week/30 Days Discharge Instructions: Apply over Kerlix as directed. Compression Wrap: Unnaboot w/Calamine, 4x10 (in/yd) 1 x  Per Week/30 Days Discharge Instructions: Apply Unnaboot as directed. Wound #6 - Lower Leg Wound Laterality: Left, Anterior Cleanser: Soap and Water 1 x Per Week/30 Days Discharge Instructions: May shower and wash wound with dial antibacterial soap and water prior to dressing change. Peri-Wound Care: Sween Lotion (Moisturizing lotion) 1 x Per Week/30 Days Discharge Instructions: Apply moisturizing lotion as directed Topical: Gentamicin 1 x Per Week/30 Days Discharge Instructions: As directed by physician Prim Dressing: Hydrofera Blue Ready Transfer Foam, 4x5 (in/in) 1 x Per Week/30 Days ary Discharge Instructions: Apply to wound bed as instructed Secondary Dressing: ABD Pad, 8x10 1 x Per Week/30 Days Discharge Instructions: Apply over primary dressing as directed. Compression Wrap: Kerlix Roll 4.5x3.1 (in/yd) 1 x Per Week/30  Days Discharge Instructions: Apply Kerlix and Coban compression as directed. PHYLICIA, MERCY ([id number]) [id number] Page 4 of 8 Compression Wrap: Coban Self-Adherent Wrap 4x5 (in/yd) 1 x Per Week/30 Days Discharge Instructions: Apply over Kerlix as directed. Compression Wrap: Unnaboot w/Calamine, 4x10 (in/yd) 1 x Per Week/30 Days Discharge Instructions: Apply Unnaboot as directed. Patient Medications llergies: gabapentin, tizanidine, amlodipine, niacin, aspirin, lisinopril, valsartan A Notifications Medication Indication Start End prior to debridement 06/05/2022 lidocaine DOSE topical 5 % ointment - ointment topical once daily Electronic Signature(s) Signed: 06/05/2022 3:56:54 PM By: Worthy Keeler PA-C Signed: 06/11/2022 2:17:47 PM By: Sharyn Creamer RN, BSN Entered By: Sharyn Creamer on 06/05/2022 11:00:50 -------------------------------------------------------------------------------- Problem List Details Patient Name: Date of Service: Jamie [name], Jamie Dark W. 06/05/2022 9:45 A M Medical Record Number: [id number] Patient Account Number: [id number] Date of Birth/Sex: Treating RN: 1940/09/15 (82 y.o. 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 05/22/2022 No Yes lower extremity L97.812 Non-pressure chronic ulcer of other part of right lower leg with fat layer 05/22/2022 No Yes exposed L97.822 Non-pressure chronic ulcer of other part of left lower leg with fat layer exposed2/21/2024 No Yes I25.10 Atherosclerotic heart disease of native coronary artery without angina pectoris 05/22/2022 No Yes Inactive Problems Resolved Problems Electronic  Signature(s) Signed: 06/05/2022 10:43:35 AM By: Worthy Keeler PA-C Entered By: Worthy Keeler on 06/05/2022 10:43:35 -------------------------------------------------------------------------------- Progress Note Details Patient Name: Date of Service: Jamie Rote W. 06/05/2022 9:45 A M Medical Record Number: [id number] Patient Account Number: [id number] Date of Birth/Sex: Treating RN: Jul 16, 1940 (82 y.o. Teronica Perkins, Wolfgang Phoenix ([id number]) [id number] Page 5 of 8 Primary Care Provider: Ozella Rocks Other Clinician: Referring Provider: Treating Provider/Extender: Alfredo Martinez, [name] in Treatment: 2 Subjective Chief Complaint Information obtained from Patient Bilateral LE Ulcers History of Present Illness (HPI) ADMISSION 06/30/2020 Mrs. Prier is an 82 year old woman who lives in [place]. She is here with her niece for review of wounds on the left medial lower leg and ankle. These have apparently been present for over a year and she followed with Dr. Juliann Pulse at the [facility] in [place] for quite a period of time although it looks as though there was a initial consult wound from Dr. Ladona Horns on February 18 presumably there was therefore hiatus. At that point the wounds were described as being there for 3 months. She also tells me she was at the wound care center in [place] for a period of time with this. 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  She had venous studies that were negative for DVT ABIs on the right were 1.01 on the left 1.06. She has . had previous applications of puraply, compression which she does not tolerate very well. She has had several rounds of oral antibiotic therapy. She complains of unrelenting pain and she is seeing Dr. Crist Fat V of pain management apparently was on oxycodone but that did not help. She also had a skin biopsy done by  Dr. Juliann Pulse although we do not have that result. She does not appear to have an arterial issue. I am not completely clear what she has been putting on the wounds lately. 3/31; this patient has a particularly nasty set of wounds on the left medial ankle in the middle of what looks to be hemosiderin deposition secondary to chronic venous insufficiency. She has a lot of pain followed by pain management. Dr. Ladona Horns apparently did a biopsy of something on the left leg last fall what I would like to get this result. She has a history of MRSA treatment. I do not believe she had reflux studies but she did have DVT rule out studies. Previous ABIs have not suggested arterial insufficiency 4/7; difficult wounds on the left medial ankle probably chronic venous insufficiency. With considerable effort on behalf of our case manager we were able to finally to speak to somebody at the hospital in [place] who indicated that no biopsy of this area have been done even though the patient describes this in some detail and is even able to point out where she thinks the biopsy was done. We have been using Sorbact. The PCR culture I did showed polymicrobial identification with Pseudomonas, staph aureus, Peptostreptococcus. All of this and low titers. Resistance genes identified were MRSA, staph virulence gene and tetracycline. We are going to send this to [facility] for a topical antibiotic which is something that we have had good success with recently in large venous ulcers with a lot of purulent drainage. There would not be an easy oral alternative here possibly line escalated and ciprofloxacin if we need to use systemic antibiotic 4/15; difficult area on the left medial ankle. Most likely chronic venous insufficiency. I think she will probably need venous reflux study I think she had DVT rule outs but not venous reflux studies. We have not yet obtained the topical antibiotics. She has home health changing the dressing we have been  using Sorbact for adherent fibrinous debris on the surface. Very difficult to remove 4/22; patient presents for 1 week follow-up. She has been using sore back under compression wraps and these are changed 3 times a week with home health. She also had [facility] antibiotics sent to her house and brought them in today. She has no complaints or issues today. 5/2; patient is here for follow-up. She has been using Sorbact under compression. Very painful wound. She has been using Keystone antibiotics. Not much improvement although the more medial part of the wound has cleaned up nicely and the larger part of the wound about 50% slough covered. Part of the issue here is that she had stays at both [facility] wound care center, [facility] wound care center and now [place]. Not sure if she has had venous reflux studies. As far as we are able to tell she did not have a biopsy. My notes state that she did not have venous reflux studies just DVT rule outs. 5/16; patient goes for venous reflux studies this afternoon. She says that wound was biopsied which sounds like punch  biopsies by Dr. Tye Maryland we do not have these results. We are using Sorbact. Very difficult wound to debride 5/23; patient presents for 1 week follow-up. She reports tolerating the wraps well with sorbact underneath. She had ABIs and venous reflux studies done. She has no issues or complaints today. She denies acute signs of infection. 6/6; I have reviewed the patient's vascular studies. Wounds are on the left medial and posterior calf. She had significant reflux in the greater saphenous vein in the in the mid thigh, distal thigh knee and the small saphenous vein in the popliteal fossa. The vein diameters do not look too impressive though. She is going to see the vascular surgeon on Wednesday. She also had venous reflux in the right common femoral vein. She did not have any evidence of a DVT or SVT I am wondering whether there is an ablation procedure that  would benefit her in the greater saphenous vein on the left. . She tells [place] that home health put the dressing on too tight and she took off 1 layer. The swelling in her left leg is a little worse as a result of this. Not much change in the wound measurements so the surface of the wound looks better Her arterial studies showed an ABI on the right of 1.14 with a triphasic waveform and a great toe pressure of 0.89. On the left her ABIs were noncompressible at 1.34 but with triphasic waveforms and a TBI of 0.97. Her greater toe pressure was 122. There was no evidence of significant bilateral arterial disease 6/20 patient went to see Dr. Doren Custard. He did not think she had significant arterial disease. In terms of her venous duplex on the right side there was no evidence of a DVT or SVT there was deep venous reflux involving the common femoral vein no superficial vein reflux on the left side there was no DVT or SVT there was no deep vein graft reflux there was some reflux in the greater saphenous vein from the mid thigh to the knee but the vein here was not dilated. He thought these were venous wounds he prescribed a compression pump but I am not sure who we ordered it from The patient has been approved for Apligraf. Still using silver collagen this week 7/5; Apligraf #1 7/19 Apligraf #2. Decent improvement in the condition of the wound bed. Epithelialization distal 8/2 Apligraf #3. No issues or complaints. Denies signs of infection. 8/17 Apligraf #4. No issues or concerns. Some complaints of pruritus and the rash. Our intake nurse brought up the fact that she had previously indicated possible cotton layer sensitivity we will therefore use kerlix in the bottom layer of the compression 8/30; the patient comes in with the area on her left medial leg just about healed. There is a superficial area more towards the tibia and a smaller open area distally everything else is epithelialized. I do not think she  requires another Apligraf 9/13; left medial leg is healed. 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 Her edema control is good Readmisstion: 05-22-2022 upon evaluation today patient presents for initial inspection here in our clinic concerning issues that she has been having with a wound of the right lateral lower extremity. This is an area of a previous skin graft she tells me. With that being said she unfortunately had a scrape on this that occurred around 10 January. Since that time she has noted that this has just continued  to get bigger and bigger in her niece who is present with her today actually states that 2 weeks ago when she saw that it was significantly smaller than what she sees currently. Obviously this is of utmost concern as we do not want this to continue to get larger when arrested and get moving in the right direction. Fortunately there does not appear to be any signs of systemic infection though locally I think we probably do have some infection present. I would obtain a culture to see what we have going on here and then we will subsequently see where things go going forward. Fortunately I think that she is in the right place at this point being at the wound center we can definitely do something to try to get this moving in the right direction. HATSUKO, TAO ([id number]) [id number] Page 6 of 8 Patient does have a history of chronic venous insufficiency as well as coronary artery disease. In the past she has done well with compression wraps on the start with a 3 layer wrap I think she is probably can end up going to a 4-layer at some point but we will see how things do over the next week. She has been using wound cleanser along with she tells me a zinc and topical but again I am not sure exactly what that was. 05-29-2022 upon evaluation today patient appears to be doing well currently in  regard to her wound. This actually is showing signs of improvement I am happy in that regard unfortunately her left leg has an area on the shin that has opened. This is the region where one of the areas at least we have previously taken care of. Nonetheless this is small hoping we get it under control before things worsen significantly. 06-05-2022 upon evaluation today patient appears to be doing well currently in regard to her wounds. The actually seem to be fairly clean she is having still quite a bit of problem with pain at this point she would like to not have debridement today for all possible. With that being said I do believe that we are making some good progress I think the [facility] is doing a decent job here. Objective Constitutional Well-nourished and well-hydrated in no acute distress. Vitals Time Taken: 10:14 AM, Height: 62 in, Weight: 171 lbs, BMI: 31.3, Temperature: 98.6 F, Pulse: 66 bpm, Respiratory Rate: 18 breaths/min, Blood Pressure: 111/68 mmHg. Respiratory normal breathing without difficulty. Psychiatric this patient is able to make decisions and demonstrates good insight into disease process. Alert and Oriented x 3. pleasant and cooperative. General Notes: Upon inspection patient's wound bed actually showed signs of good granulation and epithelization at this point. Fortunately I do not see any evidence of active infection locally or systemically which is great and overall I am extremely pleased with where we stand. The patient is in my opinion on the right track here. Integumentary (Hair, Skin) Wound #5 status is Open. Original cause of wound was Trauma. The date acquired was: 04/17/2022. The wound has been in treatment 2 weeks. The wound is located on the Right,Lateral Lower Leg. The wound measures 8cm length x 5cm width x 0.1cm depth; 31.416cm^2 area and 3.142cm^3 volume. There is Fat Layer (Subcutaneous Tissue) exposed. There is no tunneling or undermining noted.  There is a medium amount of serosanguineous drainage noted. There is large (67-100%) red granulation within the wound bed. There is a small (1-33%) amount of necrotic tissue within the wound bed including Adherent Slough.  The periwound skin appearance did not exhibit: Callus, Crepitus, Excoriation, Induration, Rash, Scarring, Dry/Scaly, Maceration, Atrophie Blanche, Cyanosis, Ecchymosis, Hemosiderin Staining, Mottled, Pallor, Rubor, Erythema. Wound #6 status is Open. Original cause of wound was Shear/Friction. The date acquired was: 05/22/2022. The wound has been in treatment 1 weeks. The wound is located on the Left,Anterior Lower Leg. The wound measures 0.5cm length x 0.6cm width x 0.1cm depth; 0.236cm^2 area and 0.024cm^3 volume. There is Fat Layer (Subcutaneous Tissue) exposed. There is no tunneling or undermining noted. There is a medium amount of serosanguineous drainage noted. There is large (67-100%) red granulation within the wound bed. There is a small (1-33%) amount of necrotic tissue within the wound bed including Adherent Slough. 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 There was a Haematologist Compression Therapy Procedure by Sharyn Creamer, RN. Post procedure Diagnosis Wound #5: Same as Pre-Procedure Wound #6 Pre-procedure diagnosis of Wound #6 is a Venous Leg Ulcer located on the Left,Anterior Lower Leg . There was a Haematologist Compression Therapy Procedure by Sharyn Creamer, RN. Post procedure Diagnosis Wound #6: Same as  Pre-Procedure IVYANNA, BERGGREN ([id number]) [id number] Page 7 of 8 Plan Follow-up Appointments: Return Appointment in 1 week. Margarita Grizzle Wednesday 215pm 06/12/2022 Return Appointment in 2 weeks. Margarita Grizzle Wednesday Anesthetic: Wound #5 Right,Lateral Lower Leg: (In clinic) Topical Lidocaine 4% applied to wound bed Bathing/ Shower/ Hygiene: May shower with protection but do not get wound dressing(s) wet. Protect dressing(s) with water repellant cover (for example, large plastic bag) or a cast cover and may then take shower. - may purchase cast protector from [facility], [facility] or [employer] Edema Control - Lymphedema / SCD / Other: Elevate legs to the level of the heart or above for 30 minutes daily and/or when sitting for 3-4 times a day throughout the day. Avoid standing for long periods of time. The following medication(s) was prescribed: lidocaine topical 5 % ointment ointment topical once daily for prior to debridement was prescribed at facility WOUND #5: - Lower Leg Wound Laterality: Right, Lateral Cleanser: Soap and Water 1 x Per Week/30 Days Discharge Instructions: May shower and wash wound with dial antibacterial soap and water prior to dressing change. Peri-Wound Care: Sween Lotion (Moisturizing lotion) 1 x Per Week/30 Days Discharge Instructions: Apply moisturizing lotion as directed Topical: Gentamicin 1 x Per Week/30 Days Discharge Instructions: As directed by physician Prim Dressing: Hydrofera Blue Ready Transfer Foam, 4x5 (in/in) 1 x Per Week/30 Days ary Discharge Instructions: Apply to wound bed as instructed Secondary Dressing: ABD Pad, 8x10 1 x Per Week/30 Days Discharge Instructions: Apply over primary dressing as directed. Com pression Wrap: Kerlix Roll 4.5x3.1 (in/yd) 1 x Per Week/30 Days Discharge Instructions: Apply Kerlix and Coban compression as directed. Com pression Wrap: Coban Self-Adherent Wrap 4x5 (in/yd) 1 x Per Week/30 Days Discharge  Instructions: Apply over Kerlix as directed. Com pression Wrap: Unnaboot w/Calamine, 4x10 (in/yd) 1 x Per Week/30 Days Discharge Instructions: Apply Unnaboot as directed. WOUND #6: - Lower Leg Wound Laterality: Left, Anterior Cleanser: Soap and Water 1 x Per Week/30 Days Discharge Instructions: May shower and wash wound  with dial antibacterial soap and water prior to dressing change. Peri-Wound Care: Sween Lotion (Moisturizing lotion) 1 x Per Week/30 Days Discharge Instructions: Apply moisturizing lotion as directed Topical: Gentamicin 1 x Per Week/30 Days Discharge Instructions: As directed by physician Prim Dressing: Hydrofera Blue Ready Transfer Foam, 4x5 (in/in) 1 x Per Week/30 Days ary Discharge Instructions: Apply to wound bed as instructed Secondary Dressing: ABD Pad, 8x10 1 x Per Week/30 Days Discharge Instructions: Apply over primary dressing as directed. Com pression Wrap: Kerlix Roll 4.5x3.1 (in/yd) 1 x Per Week/30 Days Discharge Instructions: Apply Kerlix and Coban compression as directed. Com pression Wrap: Coban Self-Adherent Wrap 4x5 (in/yd) 1 x Per Week/30 Days Discharge Instructions: Apply over Kerlix as directed. Com pression Wrap: Unnaboot w/Calamine, 4x10 (in/yd) 1 x Per Week/30 Days Discharge Instructions: Apply Unnaboot as directed. 1. I would recommend that we have the patient continue to monitor for any signs of infection or worsening. Based on what I am seeing I do believe that she is really doing quite well and I am hopeful that we will be able to continue with the wound care measures going forward without any complication or concerns hopefully this will continue to show improvement week by week. 2. Recommend specifically continue with the [facility] along with switching her to an Haematologist wrap. The 3 layer compression wraps were sliding down on her I think the Unna boot may be much better. We will see patient back for reevaluation in 1 week here in the  clinic. If anything worsens or changes patient will contact our office for additional recommendations. Electronic Signature(s) Signed: 06/05/2022 1:45:03 PM By: Worthy Keeler PA-C Entered By: Worthy Keeler on 06/05/2022 13:45:03 -------------------------------------------------------------------------------- SuperBill Details Patient Name: Date of Service: Jamie [name], [place]. 06/05/2022 Medical Record Number: [id number] Patient Account Number: [id number] Date of Birth/Sex: Treating RN: 09-30-1940 (82 y.o. Jamie [name] Primary Care Provider: Ozella Rocks Other Clinician: Referring Provider: Treating Provider/Extender: Alfredo Martinez, Roseanne Reno in Treatment: 2 Diagnosis [id number] KANITHA, Jamie [name] ([id number]) [id number] Page 8 of 8 ICD-10 Codes Code Description [phone] Chronic venous hypertension (idiopathic) with ulcer and inflammation of right lower extremity 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d L97.822 Non-pressure chronic ulcer of other part of left lower leg with fat layer exposed I25.10 Atherosclerotic heart disease of native coronary artery without angina pectoris Facility Procedures : CPT4: Code [id number] Description: 81 BILATERAL: Application of multi-layer venous compression system; leg (below knee), including ankle and t. ICD-10 Diagnosis Description I87.331 Chronic venous hypertension (idiopathic) with ulcer and inflammation of right lower extrem  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d L97.822 Non-pressure chronic ulcer of other part of left lower leg with fat layer exposed I25.10 Atherosclerotic heart disease of native coronary artery without  angina pectoris Modifier: ity Quantity: 1 Physician Procedures : CPT4 Code Description Modifier [id number] - WC PHYS LEVEL 3 - EST PT ICD-10  Diagnosis Description I87.331 Chronic venous hypertension (idiopathic) with ulcer and inflammation of right lower extremity L97.812 Non-pressure chronic ulcer of other part  of right lower leg with fat layer exposed L97.822 Non-pressure chronic ulcer of other part of left lower leg with fat layer exposed I25.10 Atherosclerotic heart disease of native coronary artery without angina pectoris Quantity: 1 Electronic Signature(s) Signed: 06/05/2022 2:52:37 PM By: Worthy Keeler PA-C Entered By: Worthy Keeler on 06/05/2022 14:52:36

## 2022-06-14 NOTE — Progress Notes (Signed)
ABIGAILROSE, NATALIE ([id number]) [id number] Page 1 of 7 Visit Report for 06/12/2022 HPI Details Patient Name: Date of Service: Jamie [name], [place]. 06/12/2022 10:30 A M Medical Record Number: [id number] Patient Account Number: [id number] Date of Birth/Sex: Treating RN: 1940/08/17 (82 y.o. F) Primary Care Provider: Ozella [name] Other Clinician: Referring Provider: Treating Provider/Extender: Jamie [name] in Treatment: 3 History of Present Illness HPI Description: ADMISSION 06/30/2020 Jamie [name] is an 82 year old woman who lives in [place]. She is here with her niece for review of wounds on the left medial lower leg and ankle. These have apparently been present for over a year and she followed with Jamie [name] at the [facility] in [place] for quite a period of time although it looks as though there was a initial consult wound from Jamie [name] on February 18 presumably there was therefore hiatus. At that point the wounds were described as being there for 3 months. She also tells me she was at the wound care center in [place] for a period of time with this. 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 She had venous studies that were negative for DVT ABIs on the right were 1.01 on the left 1.06. She has . had previous applications of puraply, compression which she does not tolerate very well. She has had several rounds of oral antibiotic therapy. She complains of unrelenting pain and she is seeing Jamie [name] of pain management apparently was on oxycodone but that did not help. She also had a skin biopsy done by Jamie [name] although we do not have that result. She does not appear to have an arterial issue. I am not completely clear what she has been putting on the wounds lately. 3/31; this patient has a particularly nasty set of wounds on the left medial ankle in the  middle of what looks to be hemosiderin deposition secondary to chronic venous insufficiency. She has a lot of pain followed by pain management. Jamie [name] apparently did a biopsy of something on the left leg last fall what I would like to get this result. She has a history of MRSA treatment. I do not believe she had reflux studies but she did have DVT rule out studies. Previous ABIs have not suggested arterial insufficiency 4/7; difficult wounds on the left medial ankle probably chronic venous insufficiency. With considerable effort on behalf of our case manager we were able to finally to speak to somebody at the hospital in [place] who indicated that no biopsy of this area have been done even though the patient describes this in some detail and is even able to point out where she thinks the biopsy was done. We have been using Sorbact. The PCR culture I did showed polymicrobial identification with Pseudomonas, staph aureus, Peptostreptococcus. All of this and low titers. Resistance genes identified were MRSA, staph virulence gene and tetracycline. We are going to send this to [facility] for a topical antibiotic which is something that we have had good success with recently in large venous ulcers with a lot of purulent drainage. There would not be an easy oral alternative here possibly line escalated and ciprofloxacin if we need to use systemic antibiotic 4/15; difficult area on the left medial ankle. Most likely chronic venous insufficiency. I think she will probably need venous reflux study I think she had DVT rule outs but not venous reflux studies. We have not yet obtained the topical antibiotics. She has  home health changing the dressing we have been using Sorbact for adherent fibrinous debris on the surface. Very difficult to remove 4/22; patient presents for 1 week follow-up. She has been using sore back under compression wraps and these are changed 3 times a week with home health. She also had  Jamie [name] antibiotics sent to her house and brought them in today. She has no complaints or issues today. 5/2; patient is here for follow-up. She has been using Sorbact under compression. Very painful wound. She has been using Jamie [name] antibiotics. Not much improvement although the more medial part of the wound has cleaned up nicely and the larger part of the wound about 50% slough covered. Part of the issue here is that she had stays at both [facility] wound care center, [facility] wound care center and now [place]. Not sure if she has had venous reflux studies. As far as we are able to tell she did not have a biopsy. My notes state that she did not have venous reflux studies just DVT rule outs. 5/16; patient goes for venous reflux studies this afternoon. She says that wound was biopsied which sounds like punch biopsies by Jamie [name] we do not have these results. We are using Sorbact. Very difficult wound to debride 5/23; patient presents for 1 week follow-up. She reports tolerating the wraps well with sorbact underneath. She had ABIs and venous reflux studies done. She has no issues or complaints today. She denies acute signs of infection. 6/6; I have reviewed the patient's vascular studies. Wounds are on the left medial and posterior calf. She had significant reflux in the greater saphenous vein in the in the mid thigh, distal thigh knee and the small saphenous vein in the popliteal fossa. The vein diameters do not look too impressive though. She is going to see the vascular surgeon on Wednesday. She also had venous reflux in the right common femoral vein. She did not have any evidence of a DVT or SVT I am wondering whether there is an ablation procedure that would benefit her in the greater saphenous vein on the left. . She tells [place] that home health put the dressing on too tight and she took off 1 layer. The swelling in her left leg is a little worse as a result of this. Not much change in the wound  measurements so the surface of the wound looks better Her arterial studies showed an ABI on the right of 1.14 with a triphasic waveform and a great toe pressure of 0.89. On the left her ABIs were noncompressible at 1.34 but with triphasic waveforms and a TBI of 0.97. Her greater toe pressure was 122. There was no evidence of significant bilateral arterial disease 6/20 patient went to see Dr. Doren Custard. He did not think she had significant arterial disease. In terms of her venous duplex on the right side there was no evidence of a DVT or SVT there was deep venous reflux involving the common femoral vein no superficial vein reflux on the left side there was no DVT or SVT there was no deep vein graft reflux there was some reflux in the greater saphenous vein from the mid thigh to the knee but the vein here was not dilated. He thought these were venous wounds he prescribed a compression pump but I am not sure who we ordered it from The patient has been approved for Apligraf. Still using silver collagen this week 7/5; Apligraf #1 7/19 Apligraf #2. Decent improvement in the condition of  the wound bed. Epithelialization distal 8/2 Apligraf #3. No issues or complaints. Denies signs of infection. 8/17 Apligraf #4. No issues or concerns. Some complaints of pruritus and the rash. Our intake nurse brought up the fact that she had previously indicated possible cotton layer sensitivity we will therefore use kerlix in the bottom layer of the compression 8/30; the patient comes in with the area on her left medial leg just about healed. There is a superficial area more towards the tibia and a smaller open area distally everything else is epithelialized. I do not think she requires another Apligraf 9/13; left medial leg is healed. 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 Her edema control is good Readmisstion: 05-22-2022 upon evaluation today patient presents  for initial inspection here in our clinic concerning issues that she has been having with a wound of the right Jamie [name], Jamie [name] ([id number]) [id number] Page 2 of 7 lateral lower extremity. This is an area of a previous skin graft she tells me. With that being said she unfortunately had a scrape on this that occurred around 10 January. Since that time she has noted that this has just continued to get bigger and bigger in her niece who is present with her today actually states that 2 weeks ago when she saw that it was significantly smaller than what she sees currently. Obviously this is of utmost concern as we do not want this to continue to get larger when arrested and get moving in the right direction. Fortunately there does not appear to be any signs of systemic infection though locally I think we probably do have some infection present. I would obtain a culture to see what we have going on here and then we will subsequently see where things go going forward. Fortunately I think that she is in the right place at this point being at the wound center we can definitely do something to try to get this moving in the right direction. Patient does have a history of chronic venous insufficiency as well as coronary artery disease. In the past she has done well with compression wraps on the start with a 3 layer wrap I think she is probably can end up going to a 4-layer at some point but we will see how things do over the next week. She has been using wound cleanser along with she tells me a zinc and topical but again I am not sure exactly what that was. 05-29-2022 upon evaluation today patient appears to be doing well currently in regard to her wound. This actually is showing signs of improvement I am happy in that regard unfortunately her left leg has an area on the shin that has opened. This is the region where one of the areas at least we have previously taken care of.  Nonetheless this is small hoping we get it under control before things worsen significantly. 06-05-2022 upon evaluation today patient appears to be doing well currently in regard to her wounds. The actually seem to be fairly clean she is having still quite a bit of problem with pain at this point she would like to not have debridement today for all possible. With that being said I do believe that we are making some good progress I think the [facility] is doing a decent job here. 3/6; patient has had 3/6; patient with known severe chronic venous insufficiency. She apparently had a traumatic wound at home and has a  reasonably substantial wound on the right lateral lower leg and a smaller one on the left anterior lower leg as well. We have been using Hydrofera Blue and Probation officer) Signed: 06/12/2022 4:27:04 PM By: Linton Ham MD Entered By: Linton Ham on 06/12/2022 11:19:21 -------------------------------------------------------------------------------- Physical Exam Details Patient Name: Date of Service: Jamie [name], [name] 06/12/2022 10:30 A M Medical Record Number: [id number] Patient Account Number: [id number] Date of Birth/Sex: Treating RN: [birth year] (82 y.o. F) Primary Care Provider: Ozella [name] Other Clinician: Referring Provider: Treating Provider/Extender: Leodis Binet Weeks in Treatment: 3 Constitutional Sitting or standing Blood Pressure is within target range for patient.. [name] regular and within target range for patient.Marland Kitchen Respirations regular, non-labored and within target range.. Temperature is normal and within the target range for the patient.Marland Kitchen Appears in no distress. Notes . Wound exam; substantial wound on the right lateral lower extremity and a smaller area on the left anterior. On the right there is significant chronic venous insufficiency changes. Apparently the measurements of improved. No  evidence of infection. We are using Hydrofera Blue and Unna boot's after the patient complained of excess pressure last time Electronic Signature(s) Signed: 06/12/2022 4:27:04 PM By: Linton Ham MD Entered By: Linton Ham on 06/12/2022 11:22:23 -------------------------------------------------------------------------------- Physician Orders Details Patient Name: Date of Service: Jamie [name], [name] 06/12/2022 10:30 A M Medical Record Number: [id number] Patient Account Number: [id number] Date of Birth/Sex: Treating RN: 1941/02/17 (82 y.o. [name], [name] Primary Care Provider: Ozella [name] Other Clinician: Referring Provider: Treating Provider/Extender: Jamie [name] in Treatment: 3 Verbal / Phone Orders: No Diagnosis Coding ICD-10 Coding Code Description I87.331 Chronic venous hypertension (idiopathic) with ulcer and inflammation of right lower extremity 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d L97.822 Non-pressure chronic ulcer of other part of left lower leg with fat layer exposed I25.10 Atherosclerotic heart disease of native coronary artery without angina pectoris Follow-up Appointments ppointment in 1 week. Margarita Grizzle Wednesday (already has an appt) 06/19/2022 1345 Return A SAMADHI, GODING ([id number]) [id number] Page 3 of 7 ppointment in 2 weeks. Margarita Grizzle Wednesday 06/26/2022 1015 room 9 Return A Anesthetic Wound #5 Right,Lateral Lower Leg (In clinic) Topical Lidocaine 4% applied to wound bed Bathing/ Shower/ Hygiene May shower with protection but do not get wound dressing(s) wet. Protect dressing(s) with water repellant cover (for example, large plastic bag) or a cast cover and may then take shower. - may purchase cast protector from [facility], [facility] or [employer] Edema Control - Lymphedema / SCD / Other Right Lower Extremity Elevate legs to the level  of the heart or above for 30 minutes daily and/or when sitting for 3-4 times a day throughout the day. Avoid standing for long periods of time. Wound Treatment Wound #5 - Lower Leg Wound Laterality: Right, Lateral Cleanser: Soap and Water 1 x Per Week/30 Days Discharge Instructions: May shower and wash wound with dial antibacterial soap and water prior to dressing change. Peri-Wound Care: Sween Lotion (Moisturizing lotion) 1 x Per Week/30 Days Discharge Instructions: Apply moisturizing lotion as directed Topical: Gentamicin 1 x Per Week/30 Days Discharge Instructions: As directed by physician Prim Dressing: Hydrofera Blue Ready Transfer Foam, 4x5 (in/in) 1 x Per Week/30 Days ary Discharge Instructions: Apply to wound bed as instructed Secondary Dressing: ABD Pad, 8x10 1 x Per Week/30 Days Discharge Instructions: Apply over primary dressing as directed. Compression Wrap: CoFlex Calamine Unna Boot 4 x 6 (in/yd) 1  x Per Week/30 Days Discharge Instructions: Apply Coflex [employer]amine [employer] as directed. Wound #6 - Lower Leg Wound Laterality: Left, Anterior Cleanser: Soap and Water 1 x Per Week/30 Days Discharge Instructions: May shower and wash wound with dial antibacterial soap and water prior to dressing change. Peri-Wound Care: Sween Lotion (Moisturizing lotion) 1 x Per Week/30 Days Discharge Instructions: Apply moisturizing lotion as directed Topical: Gentamicin 1 x Per Week/30 Days Discharge Instructions: As directed by physician Prim Dressing: Hydrofera Blue Ready Transfer Foam, 4x5 (in/in) 1 x Per Week/30 Days ary Discharge Instructions: Apply to wound bed as instructed Secondary Dressing: ABD Pad, 8x10 1 x Per Week/30 Days Discharge Instructions: Apply over primary dressing as directed. Compression Wrap: CoFlex Calamine Unna Boot 4 x 6 (in/yd) 1 x Per Week/30 Days Discharge Instructions: Apply Coflex [employer] as directed. Electronic Signature(s) Signed: 06/12/2022 4:27:04 PM  By: Linton Ham MD Signed: 06/12/2022 5:05:26 PM By: Deon Pilling RN, BSN Entered By: Deon Pilling on 06/12/2022 11:16:40 -------------------------------------------------------------------------------- Problem List Details Patient Name: Date of Service: Jamie [name], [name] 06/12/2022 10:30 A M Medical Record Number: [id number] Patient Account Number: [id number] Date of Birth/Sex: Treating RN: [birth year] (82 y.o. Debby Bud Primary Care Provider: Ozella [name] Other Clinician: Referring Provider: Treating Provider/Extender: Jamie [name] in Treatment: [address] CATRENA, THURN ([id number]) [id number] Page 4 of 7 ICD-10 Encounter Code Description Active Date MDM Diagnosis I87.331 Chronic venous hypertension (idiopathic) with ulcer and inflammation of right 05/22/2022 No Yes lower extremity L97.812 Non-pressure chronic ulcer of other part of right lower leg with fat layer 05/22/2022 No Yes exposed L97.822 Non-pressure chronic ulcer of other part of left lower leg with fat layer exposed2/21/2024 No Yes I25.10 Atherosclerotic heart disease of native coronary artery without angina pectoris 05/22/2022 No Yes Inactive Problems Resolved Problems Electronic Signature(s) Signed: 06/12/2022 4:27:04 PM By: Linton Ham MD Entered By: Linton Ham on 06/12/2022 11:17:17 -------------------------------------------------------------------------------- Progress Note Details Patient Name: Date of Service: Jamie [name], [name] 06/12/2022 10:30 A M Medical Record Number: [id number] Patient Account Number: [id number] Date of Birth/Sex: Treating RN: Feb 13, 1941 (82 y.o. F) Primary Care Provider: Ozella [name] Other Clinician: Referring Provider: Treating Provider/Extender: Leodis Binet Weeks in Treatment: 3 Subjective History of Present Illness  (HPI) ADMISSION 06/30/2020 Mrs. Shircliff is an 82 year old woman who lives in [place]. She is here with her niece for review of wounds on the left medial lower leg and ankle. These have apparently been present for over a year and she followed with Jamie [name] at the [facility] in [place] for quite a period of time although it looks as though there was a initial consult wound from Jamie [name] on February 18 presumably there was therefore hiatus. At that point the wounds were described as being there for 3 months. She also tells me she was at the wound care center in [place] for a period of time with this. 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 She had venous studies that were negative for DVT ABIs on the right were 1.01 on the left 1.06. She has . had previous applications of puraply, compression which she does not tolerate very well. She has had several rounds of oral antibiotic therapy. She complains of unrelenting pain and she is seeing Jamie [name] of pain management apparently was on oxycodone but that did not help. She also had a skin biopsy done by Jamie [name] although we  do not have that result. She does not appear to have an arterial issue. I am not completely clear what she has been putting on the wounds lately. 3/31; this patient has a particularly nasty set of wounds on the left medial ankle in the middle of what looks to be hemosiderin deposition secondary to chronic venous insufficiency. She has a lot of pain followed by pain management. Jamie [name] apparently did a biopsy of something on the left leg last fall what I would like to get this result. She has a history of MRSA treatment. I do not believe she had reflux studies but she did have DVT rule out studies. Previous ABIs have not suggested arterial insufficiency 4/7; difficult wounds on the left medial ankle probably chronic venous insufficiency. With considerable effort on  behalf of our case manager we were able to finally to speak to somebody at the hospital in [place] who indicated that no biopsy of this area have been done even though the patient describes this in some detail and is even able to point out where she thinks the biopsy was done. We have been using Sorbact. The PCR culture I did showed polymicrobial identification with Pseudomonas, staph aureus, Peptostreptococcus. All of this and low titers. Resistance genes identified were MRSA, staph virulence gene and tetracycline. We are going to send this to [facility] for a topical antibiotic which is something that we have had good success with recently in large venous ulcers with a lot of purulent drainage. There would not be an easy oral alternative here possibly line escalated and ciprofloxacin if we need to use systemic antibiotic 4/15; difficult area on the left medial ankle. Most likely chronic venous insufficiency. I think she will probably need venous reflux study I think she had DVT rule outs but not venous reflux studies. We have not yet obtained the topical antibiotics. She has home health changing the dressing we have been using Sorbact for adherent fibrinous debris on the surface. Very difficult to remove 4/22; patient presents for 1 week follow-up. She has been using sore back under compression wraps and these are changed 3 times a week with home health. She also had Jamie [name] antibiotics sent to her house and brought them in today. She has no complaints or issues today. 5/2; patient is here for follow-up. She has been using Sorbact under compression. Very painful wound. She has been using Jamie [name] antibiotics. Not much improvement although the more medial part of the wound has cleaned up nicely and the larger part of the wound about 50% slough covered. Jamie [name], Jamie [name] ([id number]) [id number] Page 5 of 7 Part of the issue here is that she had stays at both [facility]  wound care center, [facility] wound care center and now [place]. Not sure if she has had venous reflux studies. As far as we are able to tell she did not have a biopsy. My notes state that she did not have venous reflux studies just DVT rule outs. 5/16; patient goes for venous reflux studies this afternoon. She says that wound was biopsied which sounds like punch biopsies by Jamie [name] we do not have these results. We are using Sorbact. Very difficult wound to debride 5/23; patient presents for 1 week follow-up. She reports tolerating the wraps well with sorbact underneath. She had ABIs and venous reflux studies done. She has no issues or complaints today. She denies acute signs of infection. 6/6; I have reviewed the patient's vascular studies. Wounds are on the left medial and  posterior calf. She had significant reflux in the greater saphenous vein in the in the mid thigh, distal thigh knee and the small saphenous vein in the popliteal fossa. The vein diameters do not look too impressive though. She is going to see the vascular surgeon on Wednesday. She also had venous reflux in the right common femoral vein. She did not have any evidence of a DVT or SVT I am wondering whether there is an ablation procedure that would benefit her in the greater saphenous vein on the left. . She tells [place] that home health put the dressing on too tight and she took off 1 layer. The swelling in her left leg is a little worse as a result of this. Not much change in the wound measurements so the surface of the wound looks better Her arterial studies showed an ABI on the right of 1.14 with a triphasic waveform and a great toe pressure of 0.89. On the left her ABIs were noncompressible at 1.34 but with triphasic waveforms and a TBI of 0.97. Her greater toe pressure was 122. There was no evidence of significant bilateral arterial disease 6/20 patient went to see Dr. Doren Custard. He did not think she had significant arterial disease. In terms  of her venous duplex on the right side there was no evidence of a DVT or SVT there was deep venous reflux involving the common femoral vein no superficial vein reflux on the left side there was no DVT or SVT there was no deep vein graft reflux there was some reflux in the greater saphenous vein from the mid thigh to the knee but the vein here was not dilated. He thought these were venous wounds he prescribed a compression pump but I am not sure who we ordered it from The patient has been approved for Apligraf. Still using silver collagen this week 7/5; Apligraf #1 7/19 Apligraf #2. Decent improvement in the condition of the wound bed. Epithelialization distal 8/2 Apligraf #3. No issues or complaints. Denies signs of infection. 8/17 Apligraf #4. No issues or concerns. Some complaints of pruritus and the rash. Our intake nurse brought up the fact that she had previously indicated possible cotton layer sensitivity we will therefore use kerlix in the bottom layer of the compression 8/30; the patient comes in with the area on her left medial leg just about healed. There is a superficial area more towards the tibia and a smaller open area distally everything else is epithelialized. I do not think she requires another Apligraf 9/13; left medial leg is healed. 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 Her edema control is good Readmisstion: 05-22-2022 upon evaluation today patient presents for initial inspection here in our clinic concerning issues that she has been having with a wound of the right lateral lower extremity. This is an area of a previous skin graft she tells me. With that being said she unfortunately had a scrape on this that occurred around 10 January. Since that time she has noted that this has just continued to get bigger and bigger in her niece who is present with her today actually states that 2 weeks ago when she saw that it  was significantly smaller than what she sees currently. Obviously this is of utmost concern as we do not want this to continue to get larger when arrested and get moving in the right direction. Fortunately there does not appear to be any signs of systemic infection  though locally I think we probably do have some infection present. I would obtain a culture to see what we have going on here and then we will subsequently see where things go going forward. Fortunately I think that she is in the right place at this point being at the wound center we can definitely do something to try to get this moving in the right direction. Patient does have a history of chronic venous insufficiency as well as coronary artery disease. In the past she has done well with compression wraps on the start with a 3 layer wrap I think she is probably can end up going to a 4-layer at some point but we will see how things do over the next week. She has been using wound cleanser along with she tells me a zinc and topical but again I am not sure exactly what that was. 05-29-2022 upon evaluation today patient appears to be doing well currently in regard to her wound. This actually is showing signs of improvement I am happy in that regard unfortunately her left leg has an area on the shin that has opened. This is the region where one of the areas at least we have previously taken care of. Nonetheless this is small hoping we get it under control before things worsen significantly. 06-05-2022 upon evaluation today patient appears to be doing well currently in regard to her wounds. The actually seem to be fairly clean she is having still quite a bit of problem with pain at this point she would like to not have debridement today for all possible. With that being said I do believe that we are making some good progress I think the [facility] is doing a decent job here. 3/6; patient has had 3/6; patient with known severe chronic venous  insufficiency. She apparently had a traumatic wound at home and has a reasonably substantial wound on the right lateral lower leg and a smaller one on the left anterior lower leg as well. We have been using Hydrofera Blue and Unna boots Objective Constitutional Sitting or standing Blood Pressure is within target range for patient.. [name] regular and within target range for patient.Marland Kitchen Respirations regular, non-labored and within target range.. Temperature is normal and within the target range for the patient.Marland Kitchen Appears in no distress. Vitals Time Taken: 10:33 AM, Height: 62 in, Weight: 171 lbs, BMI: 31.3, Temperature: 98.1 F, [name]: 52 bpm, Respiratory Rate: 19 breaths/min, Blood Pressure: 140/78 mmHg. General Notes: . Wound exam; substantial wound on the right lateral lower extremity and a smaller area on the left anterior. On the right there is significant chronic venous insufficiency changes. Apparently the measurements of improved. No evidence of infection. We are using Hydrofera Blue and Unna boot's after the patient complained of excess pressure last time Integumentary (Hair, Skin) Wound #5 status is Open. Original cause of wound was Trauma. The date acquired was: 04/17/2022. The wound has been in treatment 3 weeks. The wound is located on the Right,Lateral Lower Leg. The wound measures 9cm length x 5cm width x 0.2cm depth; 35.343cm^2 area and 7.069cm^3 volume. There is Fat Layer (Subcutaneous Tissue) exposed. There is no tunneling or undermining noted. There is a medium amount of serosanguineous drainage noted. There is large (67-100%) red, friable granulation within the wound bed. There is a small (1-33%) amount of necrotic tissue within the wound bed including Adherent Slough. The periwound skin appearance did not exhibit: Callus, Crepitus, Excoriation, Induration, Rash, Scarring, Dry/Scaly, Maceration, Atrophie Blanche, Cyanosis, Ecchymosis,  Hemosiderin Staining, Mottled, Pallor, Rubor,  Erythema. Wound #6 status is Open. Original cause of wound was Shear/Friction. The date acquired was: 05/22/2022. The wound has been in treatment 2 weeks. The wound is located on the Left,Anterior Lower Leg. The wound measures 0.5cm length x 0.5cm width x 0.1cm depth; 0.196cm^2 area and 0.02cm^3 volume. There Jamie [name], Jamie [name] ([id number]) [id number] Page 6 of 7 is Fat Layer (Subcutaneous Tissue) exposed. There is no tunneling or undermining noted. There is a medium amount of serosanguineous drainage noted. There is large (67-100%) red, pink granulation within the wound bed. There is no necrotic tissue within the wound bed. The periwound skin appearance did not exhibit: Callus, Crepitus, Excoriation, Induration, Rash, Scarring, Dry/Scaly, Maceration, Atrophie Blanche, Cyanosis, Ecchymosis, Hemosiderin Staining, Mottled, Pallor, Rubor, Erythema. 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 There was a Haematologist Compression Therapy Procedure by Deon Pilling, RN. Post procedure Diagnosis Wound #5: Same as Pre-Procedure Wound #6 Pre-procedure diagnosis of Wound #6 is a Venous Leg Ulcer located on the Left,Anterior Lower Leg . There was a Haematologist Compression Therapy Procedure by Deon Pilling, RN. Post procedure Diagnosis Wound #6: Same as Pre-Procedure Plan Follow-up Appointments: Return Appointment in 1 week. Margarita Grizzle Wednesday (already has an appt) 06/19/2022 1345 Return Appointment in 2 weeks. Margarita Grizzle Wednesday 06/26/2022  1015 room 9 Anesthetic: Wound #5 Right,Lateral Lower Leg: (In clinic) Topical Lidocaine 4% applied to wound bed Bathing/ Shower/ Hygiene: May shower with protection but do not get wound dressing(s) wet. Protect dressing(s) with water repellant cover (for example, large plastic bag) or a cast cover and may then take shower. - may purchase cast protector from [facility], [facility] or [employer] Edema Control - Lymphedema / SCD / Other: Elevate legs to the level of the heart or above for 30 minutes daily and/or when sitting for 3-4 times a day throughout the day. Avoid standing for long periods of time. WOUND #5: - Lower Leg Wound Laterality: Right, Lateral Cleanser: Soap and Water 1 x Per Week/30 Days Discharge Instructions: May shower and wash wound with dial antibacterial soap and water prior to dressing change. Peri-Wound Care: Sween Lotion (Moisturizing lotion) 1 x Per Week/30 Days Discharge Instructions: Apply moisturizing lotion as directed Topical: Gentamicin 1 x Per Week/30 Days Discharge Instructions: As directed by physician Prim Dressing: Hydrofera Blue Ready Transfer Foam, 4x5 (in/in) 1 x Per Week/30 Days ary Discharge Instructions: Apply to wound bed as instructed Secondary Dressing: ABD Pad, 8x10 1 x Per Week/30 Days Discharge Instructions: Apply over primary dressing as directed. Com pression Wrap: CoFlex Calamine Unna Boot 4 x 6 (in/yd) 1 x Per Week/30 Days Discharge Instructions: Apply Coflex [employer] as directed. WOUND #6: - Lower Leg Wound Laterality: Left, Anterior Cleanser: Soap and Water 1 x Per Week/30 Days Discharge Instructions: May shower and wash wound with dial antibacterial soap and water prior to dressing change. Peri-Wound Care: Sween Lotion (Moisturizing lotion) 1 x Per Week/30 Days Discharge Instructions: Apply moisturizing lotion as directed Topical: Gentamicin 1 x Per Week/30 Days Discharge Instructions: As directed by physician Prim Dressing: Chrys Racer Ready Transfer  Foam, 4x5 (in/in) 1 x Per Week/30 Days ary Discharge Instructions: Apply to wound bed as instructed Secondary Dressing: ABD Pad, 8x10 1 x Per Week/30 Days Discharge Instructions: Apply over primary dressing as directed. Com pression Wrap: CoFlex Calamine Unna Boot 4 x 6 (in/yd) 1 x Per Week/30 Days Discharge Instructions: Apply Coflex [employer] as directed. 1. No change to the primary dressing or the wrap. We are using Hydrofera Blue, ABDs and Unna boots 2. No evidence of infection Jamie [name], Jamie [name] ([id number]) [id number] Page 7 of 7 Electronic Signature(s) Signed: 06/12/2022 4:27:04 PM By: Linton Ham MD Entered By: Linton Ham on 06/12/2022 11:23:29 -------------------------------------------------------------------------------- SuperBill Details Patient Name: Date of Service: Jamie [name], Mattie Marlin. 06/12/2022 Medical Record Number: [id number] Patient Account Number: [id number] Date of Birth/Sex: Treating RN: [birth year] (82 y.o. [name], [name] Primary Care Provider: Ozella [name] Other Clinician: Referring Provider: Treating Provider/Extender: Leodis Binet Weeks in Treatment: 3 Diagnosis Coding ICD-10 Codes Code Description [phone] Chronic venous hypertension (idiopathic) with ulcer and inflammation of right lower extremity 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d L97.822 Non-pressure chronic ulcer of other part of left lower leg with fat layer exposed I25.10 Atherosclerotic heart disease of native coronary artery without angina pectoris Facility Procedures : CPT4: Code [id number] Description: 36 BILATERAL: Application of multi-layer venous compression system; leg (below knee), including ankle and t. Modifier: Quantity: 1 Physician Procedures : CPT4 Code Description Modifier [id number] - WC PHYS LEVEL 3 - EST PT  ICD-10 Diagnosis Description I87.331 Chronic venous hypertension (idiopathic) with ulcer and inflammation of right lower extremity L97.812 Non-pressure chronic ulcer of other part  of right lower leg with fat layer exposed L97.822 Non-pressure chronic ulcer of other part of left lower leg with fat layer exposed Quantity: 1 Electronic Signature(s) Signed: 06/12/2022 4:27:04 PM By: Linton Ham MD Entered By: Linton Ham on 06/12/2022 11:23:58

## 2022-06-15 NOTE — Progress Notes (Signed)
JOSINA, MACDONNELL ([id number]) [id number] Page 1 of 10 Visit Report for 06/12/2022 Arrival Information Details Patient Name: Date of Service: Jamie [name], [place]. 06/12/2022 10:30 A M Medical Record Number: [id number] Patient Account Number: [id number] Date of Birth/Sex: Treating RN: April 06, 1941 (82 y.o. F) Primary Care Michaele Amundson: Ozella Rocks Other Clinician: Referring Priest Lockridge: Treating Garnie Borchardt/Extender: Denton Meek in Treatment: 3 Visit Information History Since Last Visit Added or deleted any medications: No Patient Arrived: Wheel Chair Any new allergies or adverse reactions: No Arrival Time: 10:30 Had a fall or experienced change in No Accompanied By: family activities of daily living that may affect Transfer Assistance: None risk of falls: Patient Identification Verified: Yes Signs or symptoms of abuse/neglect since last visito No Secondary Verification Process Completed: Yes Hospitalized since last visit: No Implantable device outside of the clinic excluding No cellular tissue based products placed in the center since last visit: Has Compression in Place as Prescribed: Yes Pain Present Now: Yes Electronic Signature(s) Signed: 06/13/2022 3:53:55 PM By: Erenest Blank Entered By: Erenest Blank on 06/12/2022 10:33:46 -------------------------------------------------------------------------------- Compression Therapy Details Patient Name: Date of Service: Jamie Rote W. 06/12/2022 10:30 A M Medical Record Number: [id number] Patient Account Number: [id number] Date of Birth/Sex: Treating RN: [birth year] (82 y.o. Jamie [name] Primary Care Disha Cottam: Ozella Rocks Other Clinician: Referring Anani Gu: Treating Larenzo Caples/Extender: Denton Meek in Treatment: 3 Compression Therapy Performed for Wound Assessment: Wound #5 Right,Lateral Lower  Leg Performed By: Clinician Deon Pilling, RN Compression Type: Rolena Infante Post Procedure Diagnosis Same as Pre-procedure Electronic Signature(s) Signed: 06/12/2022 5:05:26 PM By: Deon Pilling RN, BSN Entered By: Deon Pilling on 06/12/2022 11:14:11 -------------------------------------------------------------------------------- Compression Therapy Details Patient Name: Date of Service: Jamie Rote W. 06/12/2022 10:30 A M Medical Record Number: [id number] Patient Account Number: [id number] Date of Birth/Sex: Treating RN: Nov 10, 1940 (82 y.o. Jamie [name] Primary Care Jyl Chico: Ozella Rocks Other Clinician: Referring Odester Nilson: Treating Guiselle Mian/Extender: Denton Meek in Treatment: 3 Compression Therapy Performed for Wound Assessment: Wound #6 Left,Anterior Lower Leg Performed By: Clinician Deon Pilling, RN Compression Type: Rolena Infante Post Procedure Diagnosis Same as Jamie [name], Jamie [name] ([id number]) [phone].pdf Page 2 of 10 Electronic Signature(s) Signed: 06/12/2022 5:05:26 PM By: Deon Pilling RN, BSN Entered By: Deon Pilling on 06/12/2022 11:14:11 -------------------------------------------------------------------------------- Encounter Discharge Information Details Patient Name: Date of Service: Jamie [name], Jamie Dark W. 06/12/2022 10:30 A M Medical Record Number: [id number] Patient Account Number: [id number] Date of Birth/Sex: Treating RN: 1940-08-01 (82 y.o. Jamie [name] Primary Care Jeiry Birnbaum: Ozella Rocks Other Clinician: Referring Kaymarie Wynn: Treating Octivia Canion/Extender: Denton Meek in Treatment: 3 Encounter Discharge Information Items Discharge Condition: Stable Ambulatory Status: Cane Discharge Destination: Home Transportation: Private Auto Accompanied By: daughter Schedule Follow-up Appointment: Yes Clinical Summary of  Care: Electronic Signature(s) Signed: 06/12/2022 5:05:26 PM By: Deon Pilling RN, BSN Entered By: Deon Pilling on 06/12/2022 11:15:05 -------------------------------------------------------------------------------- Lower Extremity Assessment Details Patient Name: Date of Service: Jamie [name]. 06/12/2022 10:30 A M Medical Record Number: [id number] Patient Account Number: [id number] Date of Birth/Sex: Treating RN: Aug 26, 1940 (82 y.o. F) Primary Care Bryten Maher: Ozella Rocks Other Clinician: Referring Imajean Mcdermid: Treating Marlane Hirschmann/Extender: Jamie Binet Weeks in Treatment: 3 Edema Assessment Assessed: [Left: No] [Right: No] Edema: [Left: Ye] [Right: s] Calf Left: Right: Point of Measurement: From Medial Instep 35.5 cm 36.4 cm Ankle Left: Right: Point of Measurement: From Medial Instep 22 cm 24.3 cm Electronic Signature(s) Signed: 06/13/2022 3:53:55 PM By:  Erenest Blank Entered By: Erenest Blank on 06/12/2022 10:49:30 -------------------------------------------------------------------------------- Multi Wound Chart Details Patient Name: Date of Service: Jamie [name], [place]. 06/12/2022 10:30 A M Medical Record Number: [id number] Patient Account Number: [id number] Date of Birth/Sex: Treating RN: [birth year] (82 y.o. 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 N/A Primary  Etiology: Sleep Apnea, Hypertension, Peripheral Sleep Apnea, Hypertension, Peripheral N/A Comorbid History: Venous Disease, Osteoarthritis, Venous Disease, Osteoarthritis, Neuropathy Neuropathy 04/17/2022 05/22/2022 N/A Date Acquired: 3 2 N/A Weeks of Treatment: Open Open N/A Wound Status: No No N/A Wound Recurrence: 9x5x0.2 0.5x0.5x0.1 N/A Measurements L x W x D (cm) 35.343 0.196 N/A A (cm) : rea 7.069 0.02 N/A Volume (cm) : -35.10% 40.60% N/A % Reduction in Area: 10.00% 39.40% N/A % Reduction in Volume: Full Thickness Without Exposed Partial Thickness N/A Classification: Support Structures Medium Medium N/A Exudate Amount: Serosanguineous Serosanguineous N/A Exudate Type: red, brown red, brown N/A Exudate Color: Large (67-100%) Large (67-100%) N/A Granulation Amount: Red, Friable Red, Pink N/A Granulation Quality: Small (1-33%) None Present (0%) N/A Necrotic Amount: Fat Layer (Subcutaneous Tissue): Yes Fat Layer (Subcutaneous Tissue): Yes N/A Exposed Structures: Fascia: No Tendon: No Muscle: No Joint: No Bone: No None Small (1-33%) N/A Epithelialization: Excoriation: No Excoriation: No N/A Periwound Skin Texture: Induration: No Induration: No Callus: No Callus: No Crepitus: No Crepitus: No Rash: No Rash: No Scarring: No Scarring: No Maceration: No Maceration: No N/A Periwound Skin Moisture: Dry/Scaly: No Dry/Scaly: No Atrophie Blanche: No Atrophie Blanche: No N/A Periwound Skin Color: Cyanosis: No Cyanosis: No Ecchymosis: No Ecchymosis: No Erythema: No Erythema: No Hemosiderin Staining: No Hemosiderin Staining: No Mottled: No Mottled: No Pallor: No Pallor: No Rubor: No Rubor: No Compression Therapy Compression Therapy N/A Procedures Performed: Treatment Notes Wound #5 (Lower Leg) Wound Laterality: Right, Lateral Cleanser Soap and Water Discharge Instruction: May shower and wash wound with dial antibacterial soap and water  prior to dressing change. Peri-Wound Care Sween Lotion (Moisturizing lotion) Discharge Instruction: Apply moisturizing lotion as directed Topical Gentamicin Discharge Instruction: As directed by physician Chryl Heck ([id number]) [id number] Page 4 of 10 Primary Dressing Hydrofera Blue Ready Transfer Foam, 4x5 (in/in) Discharge Instruction: Apply to wound bed as instructed Secondary Dressing ABD Pad, 8x10 Discharge Instruction: Apply over primary dressing as directed. Secured With Compression Wrap CoFlex Calamine Unna Boot 4 x 6 (in/yd) Discharge Instruction: Apply Coflex Calamine [employer] as directed. Compression Stockings Add-Ons Wound #6 (Lower Leg) Wound Laterality: Left, Anterior Cleanser Soap and Water Discharge Instruction: May shower and wash wound with dial antibacterial soap and water prior to dressing change. Peri-Wound Care Sween Lotion (Moisturizing lotion) Discharge Instruction: Apply moisturizing lotion as directed Topical Gentamicin Discharge Instruction: As directed by physician Primary Dressing Hydrofera Blue Ready Transfer Foam, 4x5 (in/in) Discharge Instruction: Apply to wound bed as instructed Secondary Dressing ABD Pad, 8x10 Discharge Instruction: Apply over primary dressing as directed. Secured With Compression Wrap CoFlex Calamine Unna Boot 4 x 6 (in/yd) Discharge Instruction: Apply Coflex [employer] as directed. Compression Stockings Add-Ons Electronic Signature(s) Signed: 06/12/2022 4:27:04 PM By: Linton Ham MD Entered By: Linton Ham on 06/12/2022 11:17:23 --------------------------------------------------------------------------------  Multi-Disciplinary Care Plan Details Patient Name: Date of Service: Jamie [name], [place]. 06/12/2022 10:30 A M Medical Record Number: [id number] Patient Account Number: [id number] Date of Birth/Sex: Treating RN: Jul 12, 1940 (82 y.o. Jamie [name] Primary Care  Jazari Ober: Ozella Rocks Other Clinician: Referring Ladavion Savitz: Treating Mikaiah Stoffer/Extender: Denton Meek in Treatment: 3 Active Inactive Venous Leg Ulcer Nursing Diagnoses: Jamie [name], Jamie [name] ([id number]) [phone].pdf Page 5 of 10 Actual venous Insuffiency (use after diagnosis is confirmed) Knowledge deficit related to disease process and management Goals: Patient will maintain optimal edema control Date Initiated: 05/22/2022 Target Resolution Date: 06/26/2022 Goal Status: Active Patient/caregiver will verbalize understanding of disease process and disease management Date Initiated: 05/22/2022 Target Resolution Date: 06/19/2022 Goal Status: Active Interventions: Assess peripheral edema status every visit. Compression as ordered Provide education on venous insufficiency Notes: Wound/Skin Impairment Nursing Diagnoses: Impaired tissue integrity Knowledge deficit related to ulceration/compromised skin integrity Goals: Patient/caregiver will verbalize understanding of skin care regimen Date Initiated: 05/22/2022 Target Resolution Date: 06/26/2022 Goal Status: Active Ulcer/skin breakdown will have a volume reduction of 30% by week 4 Date Initiated: 05/22/2022 Target Resolution Date: 06/26/2022 Goal Status: Active Interventions: Assess patient/caregiver ability to obtain necessary supplies Assess patient/caregiver ability to perform ulcer/skin care regimen upon admission and as needed Assess ulceration(s) every visit Provide education on ulcer and skin care Notes: Electronic Signature(s) Signed: 06/12/2022 5:05:26 PM By: Deon Pilling RN, BSN Entered By: Deon Pilling on 06/12/2022 11:12:22 -------------------------------------------------------------------------------- Pain Assessment Details Patient Name: Date of Service: Jamie [name], Jamie Dark W. 06/12/2022 10:30 A M Medical Record Number: [id number] Patient  Account Number: [id number] Date of Birth/Sex: Treating RN: 10/07/1940 (82 y.o. F) Primary Care Darl Brisbin: Ozella Rocks Other Clinician: Referring Kadarious Dikes: Treating Reginaldo Hazard/Extender: Denton Meek in Treatment: 3 Active Problems Location of Pain Severity and Description of Pain Patient Has Paino Yes Site Locations Pain Location: Jamie [name], Jamie [name] ([id number]) [phone].pdf Page 6 of 10 Pain Location: Pain in Ulcers Rate the pain. Current Pain Level: 8 Character of Pain Describe the Pain: Stabbing Pain Management and Medication Current Pain Management: Electronic Signature(s) Signed: 06/13/2022 3:53:55 PM By: Erenest Blank Entered By: Erenest Blank on 06/12/2022 10:35:02 -------------------------------------------------------------------------------- Patient/Caregiver Education Details Patient Name: Date of Service: Jamie [name] 3/6/2024andnbsp10:30 [place] Record Number: [id number] Patient Account Number: [id number] Date of Birth/Gender: Treating RN: Dec 01, 1940 (82 y.o. Jamie [name] Primary Care Physician: Ozella Rocks Other Clinician: Referring Physician: Treating Physician/Extender: Denton Meek in Treatment: 3 Education Assessment Education Provided To: Patient Education Topics Provided Wound/Skin Impairment: Handouts: Caring for Your Ulcer Methods: Explain/Verbal Responses: Reinforcements needed Electronic Signature(s) Signed: 06/12/2022 5:05:26 PM By: Deon Pilling RN, BSN Entered By: Deon Pilling on 06/12/2022 11:12:33 -------------------------------------------------------------------------------- Wound Assessment Details Patient Name: Date of Service: Jamie Rote W. 06/12/2022 10:30 A M Medical Record Number: [id number] Patient Account Number: [id number] Date of Birth/Sex: Treating RN: 1940-04-19 (82 y.o. F) Primary  Care Eular Panek: Ozella Rocks Other Clinician: Referring Dasani Crear: Treating Jamie Minniear/Extender: Jamie Binet Weeks in Treatment: 3 Wound Status Wound Number: 5 Primary Venous Leg Ulcer Etiology: Wound Location: Right, Lateral Lower Leg Jamie [name], Jamie [name] ([id number]) [phone].pdf Page 7 of 10 Wound Open Wounding Event: Trauma Status: Date Acquired: 04/17/2022 Comorbid Sleep Apnea, Hypertension, Peripheral Venous Disease, Weeks Of Treatment: 3 History: Osteoarthritis, Neuropathy Clustered Wound: No Photos Wound Measurements Length: (cm) 9 Width: (cm) 5 Depth: (cm) 0.2 Area: (cm) 35.343 Volume: (cm) 7.069 % Reduction in Area: -35.1% % Reduction in Volume: 10%  Epithelialization: None Tunneling: No Undermining: No Wound Description Classification: Full Thickness Without Exposed Support Structures Exudate Amount: Medium Exudate Type: Serosanguineous Exudate Color: red, brown Foul Odor After Cleansing: No Slough/Fibrino Yes Wound Bed Granulation Amount: Large (67-100%) Exposed Structure Granulation Quality: Red, Friable Fascia Exposed: No Necrotic Amount: Small (1-33%) Fat Layer (Subcutaneous Tissue) Exposed: Yes Necrotic Quality: Adherent Slough Tendon Exposed: No Muscle Exposed: No Joint Exposed: No Bone Exposed: No Periwound Skin Texture Texture Color No Abnormalities Noted: No No Abnormalities Noted: No Callus: No Atrophie Blanche: No Crepitus: No Cyanosis: No Excoriation: No Ecchymosis: No Induration: No Erythema: No Rash: No Hemosiderin Staining: No Scarring: No Mottled: No Pallor: No Moisture Rubor: No No Abnormalities Noted: No Dry / Scaly: No Maceration: No Treatment Notes Wound #5 (Lower Leg) Wound Laterality: Right, Lateral Cleanser Soap and Water Discharge Instruction: May shower and wash wound with dial antibacterial soap and water prior to dressing change. Peri-Wound  Care Sween Lotion (Moisturizing lotion) Discharge Instruction: Apply moisturizing lotion as directed Topical Gentamicin Discharge Instruction: As directed by physician Primary Dressing [address] Ready Transfer Foam, 4x5 (in/in) Jamie [name], Jamie [name] ([id number]) [id number] Page 8 of 10 Discharge Instruction: Apply to wound bed as instructed Secondary Dressing ABD Pad, 8x10 Discharge Instruction: Apply over primary dressing as directed. Secured With Compression Wrap CoFlex Calamine Unna Boot 4 x 6 (in/yd) Discharge Instruction: Apply Coflex [employer] as directed. Compression Stockings Add-Ons Electronic Signature(s) Signed: 06/13/2022 3:53:55 PM By: Erenest Blank Entered By: Erenest Blank on 06/12/2022 10:51:53 -------------------------------------------------------------------------------- Wound Assessment Details Patient Name: Date of Service: Jamie [name], Jamie Dark W. 06/12/2022 10:30 A M Medical Record Number: [id number] Patient Account Number: [id number] Date of Birth/Sex: Treating RN: 1940-06-12 (82 y.o. F) Primary Care Brenlee Koskela: Ozella Rocks Other Clinician: Referring Cariah Salatino: Treating Adelina Collard/Extender: Jamie Binet Weeks in Treatment: 3 Wound Status Wound Number: 6 Primary Venous Leg Ulcer Etiology: Wound Location: Left, Anterior Lower Leg Wound Open Wounding Event: Shear/Friction Status: Date Acquired: 05/22/2022 Comorbid Sleep Apnea, Hypertension, Peripheral Venous Disease, Weeks Of Treatment: 2 History: Osteoarthritis, Neuropathy Clustered Wound: No Photos Wound Measurements Length: (cm) 0.5 Width: (cm) 0.5 Depth: (cm) 0.1 Area: (cm) 0.196 Volume: (cm) 0.02 % Reduction in Area: 40.6% % Reduction in Volume: 39.4% Epithelialization: Small (1-33%) Tunneling: No Undermining: No Wound Description Classification: Partial Thickness Exudate Amount: Medium Exudate Type:  Serosanguineous Exudate Color: red, brown Foul Odor After Cleansing: No Slough/Fibrino Yes Wound Bed Granulation Amount: Large (67-100%) Exposed Structure Granulation Quality: Red, Pink Fat Layer (Subcutaneous Tissue) Exposed: Yes Necrotic Amount: None Present (0%) [address] Jamie [name], Jamie [name] ([id number]) [id number] Page 9 of 10 Texture Color No Abnormalities Noted: No No Abnormalities Noted: No Callus: No Atrophie Blanche: No Crepitus: No Cyanosis: No Excoriation: No Ecchymosis: No Induration: No Erythema: No Rash: No Hemosiderin Staining: No Scarring: No Mottled: No Pallor: No Moisture Rubor: No No Abnormalities Noted: No Dry / Scaly: No Maceration: No Treatment Notes Wound #6 (Lower Leg) Wound Laterality: Left, Anterior Cleanser Soap and Water Discharge Instruction: May shower and wash wound with dial antibacterial soap and water prior to dressing change. Peri-Wound Care Sween Lotion (Moisturizing lotion) Discharge Instruction: Apply moisturizing lotion as directed Topical Gentamicin Discharge Instruction: As directed by physician Primary Dressing Hydrofera Blue Ready Transfer Foam, 4x5 (in/in) Discharge Instruction: Apply to wound bed as instructed Secondary Dressing ABD Pad, 8x10 Discharge Instruction: Apply over primary dressing as directed. Secured With Compression Wrap CoFlex Calamine Unna Boot 4 x 6 (in/yd) Discharge Instruction: Apply Coflex [employer]  as directed. Compression Stockings Add-Ons Electronic Signature(s) Signed: 06/13/2022 3:53:55 PM By: Erenest Blank Entered By: Erenest Blank on 06/12/2022 10:52:22 -------------------------------------------------------------------------------- Vitals Details Patient Name: Date of Service: Jamie Everts, MA [name] 06/12/2022 10:30 A M Medical Record Number: [id number] Patient Account Number: [id number] Date of Birth/Sex: Treating RN: March 27, 1941 (83  y.o. F) Primary Care Ketsia Linebaugh: Ozella Rocks Other Clinician: Referring Willa Brocks: Treating Valentino Saavedra/Extender: Denton Meek in Treatment: 3 Vital Signs Time Taken: 10:33 Temperature (F): 98.1 Height (in): 62 Pulse (bpm): 52 Weight (lbs): 171 Respiratory Rate (breaths/min): 19 Body Mass Index (BMI): 31.3 Blood Pressure (mmHg): 140/78 Reference Range: 80 - 120 mg / dl Electronic Signature(s) Signed: 06/13/2022 3:53:55 PM By: Erenest Blank Entered By: Erenest Blank on 06/12/2022 10:34:08 Chryl Heck ([id number]) [id number] Page 10 of 10

## 2022-06-19 ENCOUNTER — Encounter (HOSPITAL_BASED_OUTPATIENT_CLINIC_OR_DEPARTMENT_OTHER): Payer: Medicare HMO | Admitting: Internal Medicine

## 2022-06-19 DIAGNOSIS — I87331 Chronic venous hypertension (idiopathic) with ulcer and inflammation of right lower extremity: Secondary | ICD-10-CM | POA: Diagnosis not present

## 2022-06-19 DIAGNOSIS — L97822 Non-pressure chronic ulcer of other part of left lower leg with fat layer exposed: Secondary | ICD-10-CM

## 2022-06-19 DIAGNOSIS — I2510 Atherosclerotic heart disease of native coronary artery without angina pectoris: Secondary | ICD-10-CM

## 2022-06-19 DIAGNOSIS — L97812 Non-pressure chronic ulcer of other part of right lower leg with fat layer exposed: Secondary | ICD-10-CM

## 2022-06-21 NOTE — Progress Notes (Signed)
Jamie, [name] ([id number]) [id number] Page 1 of 9 Visit Report for 06/19/2022 Chief Complaint Document Details Patient Name: Date of Service: Jamie [name], [place]. 06/19/2022 1:45 PM Medical Record Number: [id number] Patient Account Number: [id number] Date of Birth/Sex: Treating RN: 1941-01-22 (82 y.o. F) Primary Care Provider: Ozella Rocks Other Clinician: Referring Provider: Treating Provider/Extender: Kerrie Buffalo Weeks in Treatment: 4 Information Obtained from: Patient Chief Complaint Bilateral LE Ulcers Electronic Signature(s) Signed: 06/20/2022 3:34:03 PM By: Kalman Shan DO Entered By: Kalman Shan on 06/19/2022 15:36:32 -------------------------------------------------------------------------------- HPI Details Patient Name: Date of Service: Jamie [name], [name] 06/19/2022 1:45 PM Medical Record Number: [id number] Patient Account Number: [id number] Date of Birth/Sex: Treating RN: [birth year] (82 y.o. F) Primary Care Provider: Ozella Rocks Other Clinician: Referring Provider: Treating Provider/Extender: Kerrie Buffalo Weeks in Treatment: 4 History of Present Illness HPI Description: ADMISSION 06/30/2020 Jamie [name] is an 82 year old woman who lives in [place]. She is here with her niece for review of wounds on the left medial lower leg and ankle. These have apparently been present for over a year and she followed with Dr. Juliann Pulse at the [facility] in [place] [facility] for quite a period of time although it looks as though there was a initial consult wound from Dr. Ladona Horns on February 18 presumably there was therefore hiatus. At that point the wounds were described as being there for 3 months. She also tells me she was at the wound care center in [place] for a period of time with this. 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 She had venous studies that were negative for DVT ABIs on the right were 1.01 on the left 1.06. She has . had previous applications of puraply, compression which she does not tolerate very well. She has had several rounds of oral antibiotic therapy. She complains of unrelenting pain and she is seeing Dr. Crist Fat V of pain management apparently was on oxycodone but that did not help. She also had a skin biopsy done by Dr. Juliann Pulse although we do not have that result. She does not appear to have an arterial issue. I am not completely clear what she has been putting on the wounds lately. 3/31; this patient has a particularly nasty set of wounds on the left medial ankle in the middle of what looks to be hemosiderin deposition secondary to chronic venous insufficiency. She has a lot of pain followed by pain management. Dr. Ladona Horns apparently did a biopsy of something on the left leg last fall what I would like to get this result. She has a history of MRSA treatment. I do not believe she had reflux studies but she did have DVT rule out studies. Previous ABIs have not suggested arterial insufficiency 4/7; difficult wounds on the left medial ankle probably chronic venous insufficiency. With considerable effort on behalf of our case manager we were able to finally to speak to somebody at the hospital in [place] who indicated that no biopsy of this area have been done even though the patient describes this in some detail and is even able to point out where she thinks the biopsy was done. We have been using Sorbact. The PCR culture I did showed polymicrobial identification with Pseudomonas, staph aureus, Peptostreptococcus. All of this and low titers. Resistance genes identified were MRSA, staph virulence gene and tetracycline. We are going to send this to [facility] for a topical antibiotic which is something that we have had  good success with recently in large venous ulcers with a lot of purulent  drainage. There would not be an easy oral alternative here possibly line escalated and ciprofloxacin if we need to use systemic antibiotic 4/15; difficult area on the left medial ankle. Most likely chronic venous insufficiency. I think she will probably need venous reflux study I think she had DVT rule outs but not venous reflux studies. We have not yet obtained the topical antibiotics. She has home health changing the dressing we have been using Sorbact for adherent fibrinous debris on the surface. Very difficult to remove 4/22; patient presents for 1 week follow-up. She has been using sore back under compression wraps and these are changed 3 times a week with home health. She also had [facility] antibiotics sent to her house and brought them in today. She has no complaints or issues today. 5/2; patient is here for follow-up. She has been using Sorbact under compression. Very painful wound. She has been using Keystone antibiotics. Not much improvement although the more medial part of the wound has cleaned up nicely and the larger part of the wound about 50% slough covered. Part of the issue here is that she had stays at both [facility] wound care center, [facility] wound care center and now [place]. Not sure if she has had venous reflux studies. As far as we are able to tell she did not have a biopsy. My notes state that she did not have venous reflux studies just DVT rule outs. 5/16; patient goes for venous reflux studies this afternoon. She says that wound was biopsied which sounds like punch biopsies by Dr. Tye Maryland we do not have these results. We are using Sorbact. Very difficult wound to debride 5/23; patient presents for 1 week follow-up. She reports tolerating the wraps well with sorbact underneath. She had ABIs and venous reflux studies done. She ILYNN, SOWDER ([id number]) [id number] Page 2 of 9 has no issues or complaints today. She denies acute signs of infection. 6/6;  I have reviewed the patient's vascular studies. Wounds are on the left medial and posterior calf. She had significant reflux in the greater saphenous vein in the in the mid thigh, distal thigh knee and the small saphenous vein in the popliteal fossa. The vein diameters do not look too impressive though. She is going to see the vascular surgeon on Wednesday. She also had venous reflux in the right common femoral vein. She did not have any evidence of a DVT or SVT I am wondering whether there is an ablation procedure that would benefit her in the greater saphenous vein on the left. . She tells [place] that home health put the dressing on too tight and she took off 1 layer. The swelling in her left leg is a little worse as a result of this. Not much change in the wound measurements so the surface of the wound looks better Her arterial studies showed an ABI on the right of 1.14 with a triphasic waveform and a great toe pressure of 0.89. On the left her ABIs were noncompressible at 1.34 but with triphasic waveforms and a TBI of 0.97. Her greater toe pressure was 122. There was no evidence of significant bilateral arterial disease 6/20 patient went to see Dr. Doren Custard. He did not think she had significant arterial disease. In terms of her venous duplex on the right side there was no evidence of a DVT or SVT there was deep venous reflux involving the common femoral vein no superficial  vein reflux on the left side there was no DVT or SVT there was no deep vein graft reflux there was some reflux in the greater saphenous vein from the mid thigh to the knee but the vein here was not dilated. He thought these were venous wounds he prescribed a compression pump but I am not sure who we ordered it from The patient has been approved for Apligraf. Still using silver collagen this week 7/5; Apligraf #1 7/19 Apligraf #2. Decent improvement in the condition of the wound bed. Epithelialization distal 8/2 Apligraf #3. No issues  or complaints. Denies signs of infection. 8/17 Apligraf #4. No issues or concerns. Some complaints of pruritus and the rash. Our intake nurse brought up the fact that she had previously indicated possible cotton layer sensitivity we will therefore use kerlix in the bottom layer of the compression 8/30; the patient comes in with the area on her left medial leg just about healed. There is a superficial area more towards the tibia and a smaller open area distally everything else is epithelialized. I do not think she requires another Apligraf 9/13; left medial leg is healed. 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 Her edema control is good Readmisstion: 05-22-2022 upon evaluation today patient presents for initial inspection here in our clinic concerning issues that she has been having with a wound of the right lateral lower extremity. This is an area of a previous skin graft she tells me. With that being said she unfortunately had a scrape on this that occurred around 10 January. Since that time she has noted that this has just continued to get bigger and bigger in her niece who is present with her today actually states that 2 weeks ago when she saw that it was significantly smaller than what she sees currently. Obviously this is of utmost concern as we do not want this to continue to get larger when arrested and get moving in the right direction. Fortunately there does not appear to be any signs of systemic infection though locally I think we probably do have some infection present. I would obtain a culture to see what we have going on here and then we will subsequently see where things go going forward. Fortunately I think that she is in the right place at this point being at the wound center we can definitely do something to try to get this moving in the right direction. Patient does have a history of chronic venous insufficiency as well as  coronary artery disease. In the past she has done well with compression wraps on the start with a 3 layer wrap I think she is probably can end up going to a 4-layer at some point but we will see how things do over the next week. She has been using wound cleanser along with she tells me a zinc and topical but again I am not sure exactly what that was. 05-29-2022 upon evaluation today patient appears to be doing well currently in regard to her wound. This actually is showing signs of improvement I am happy in that regard unfortunately her left leg has an area on the shin that has opened. This is the region where one of the areas at least we have previously taken care of. Nonetheless this is small hoping we get it under control before things worsen significantly. 06-05-2022 upon evaluation today patient appears to be doing well currently in regard to her wounds. The  actually seem to be fairly clean she is having still quite a bit of problem with pain at this point she would like to not have debridement today for all possible. With that being said I do believe that we are making some good progress I think the [facility] is doing a decent job here. 3/6; patient has had 3/6; patient with known severe chronic venous insufficiency. She apparently had a traumatic wound at home and has a reasonably substantial wound on the right lateral lower leg and a smaller one on the left anterior lower leg as well. We have been using Hydrofera Blue and Unna boots 3/13; patient presents for follow-up. We have been using Hydrofera Blue under Coflex to the legs bilaterally. She has no issues or complaints today. Electronic Signature(s) Signed: 06/20/2022 3:34:03 PM By: Kalman Shan DO Entered By: Kalman Shan on 06/19/2022 15:37:50 -------------------------------------------------------------------------------- Physical Exam Details Patient Name: Date of Service: [name]. 06/19/2022 1:45 PM Medical  Record Number: [id number] Patient Account Number: [id number] Date of Birth/Sex: Treating RN: Jun 01, 1940 (82 y.o. F) Primary Care Provider: Ozella Rocks Other Clinician: Referring Provider: Treating Provider/Extender: Kerrie Buffalo Weeks in Treatment: 4 Constitutional respirations regular, non-labored and within target range for patient.. Cardiovascular 2+ dorsalis pedis/posterior tibialis pulses. Psychiatric pleasant and cooperative. Notes T the right lateral lower extremity there is a small open wound with granulation tissue throughout. T the left lower extremity there is a large open wound with o o granulation tissue and tightly adhered nonviable tissue. No signs of infection. Good edema control. 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 CACEE, FLOWERS ([id number]) [id number] Page 3 of 9 Electronic Signature(s) Signed: 06/20/2022 3:34:03 PM By: Kalman Shan DO Entered By: Kalman Shan on 06/19/2022 15:38:56 -------------------------------------------------------------------------------- Physician Orders Details Patient Name: Date of Service: Jamie [name], [name] 06/19/2022 1:45 PM Medical Record Number: [id number] Patient Account Number: [id number] Date of Birth/Sex: Treating RN: February 24, 1941 (82 y.o. Benjaman Lobe Primary Care Provider: Ozella Rocks Other Clinician: Referring Provider: Treating Provider/Extender: Kerrie Buffalo Weeks in Treatment: 4 Verbal / Phone Orders: No Diagnosis Coding Follow-up Appointments ppointment in 1 week. Margarita Grizzle and [facility] Wednesday 06/26/2022 1015 room 8 Return A ppointment in 2 weeks. Jeri Cos and [place] Rm # 8 Wednesday 07/03/22 @ 10:15 Return A Anesthetic Wound #5 Right,Lateral Lower Leg (In clinic) Topical Lidocaine 4% applied to wound bed Bathing/ Shower/ Hygiene May shower with  protection but do not get wound dressing(s) wet. Protect dressing(s) with water repellant cover (for example, large plastic bag) or a cast cover and may then take shower. - may purchase cast protector from [facility], [facility] or [employer] Edema Control - Lymphedema / SCD / Other Right Lower Extremity Elevate legs to the level of the heart or above for 30 minutes daily and/or when sitting for 3-4 times a day throughout the day. Avoid standing for long periods of time. Wound Treatment Wound #5 - Lower Leg Wound Laterality: Right, Lateral Cleanser: Soap and Water 1 x Per Week/30 Days Discharge Instructions: May shower and wash wound with dial antibacterial soap and water prior to dressing change. Peri-Wound Care: Sween Lotion (Moisturizing lotion) 1 x Per Week/30 Days Discharge Instructions: Apply moisturizing lotion as directed Topical: Gentamicin 1 x Per Week/30 Days Discharge Instructions: As directed by physician Topical: Mupirocin Ointment 1 x Per Week/30 Days Discharge Instructions: Apply Mupirocin (Bactroban) as instructed Prim Dressing: Hydrofera Blue Ready Transfer Foam, 4x5 (in/in) 1 x Per Week/30 Days  ary Discharge Instructions: Apply to wound bed as instructed Secondary Dressing: ABD Pad, 8x10 1 x Per Week/30 Days Discharge Instructions: Apply over primary dressing as directed. Compression Wrap: CoFlex Calamine Unna Boot 4 x 6 (in/yd) 1 x Per Week/30 Days Discharge Instructions: Apply Coflex [employer] as directed. Wound #6 - Lower Leg Wound Laterality: Left, Anterior Cleanser: Soap and Water 1 x Per Week/30 Days Discharge Instructions: May shower and wash wound with dial antibacterial soap and water prior to dressing change. Peri-Wound Care: Sween Lotion (Moisturizing lotion) 1 x Per Week/30 Days Discharge Instructions: Apply moisturizing lotion as directed Topical: Gentamicin 1 x Per Week/30 Days Discharge Instructions: As directed by physician Topical: Mupirocin Ointment 1 x  Per Week/30 Days Discharge Instructions: Apply Mupirocin (Bactroban) as instructed Prim Dressing: Hydrofera Blue Ready Transfer Foam, 4x5 (in/in) 1 x Per Week/30 Days ary Discharge Instructions: Apply to wound bed as instructed Secondary Dressing: ABD Pad, 8x10 1 x Per Week/30 Days QUIERA, BARBERIO ([id number]) [id number] Page 4 of 9 Discharge Instructions: Apply over primary dressing as directed. Compression Wrap: CoFlex Calamine Unna Boot 4 x 6 (in/yd) 1 x Per Week/30 Days Discharge Instructions: Apply Coflex [employer] as directed. Electronic Signature(s) Signed: 06/20/2022 3:34:03 PM By: Kalman Shan DO Entered By: Kalman Shan on 06/19/2022 15:39:05 -------------------------------------------------------------------------------- Problem List Details Patient Name: Date of Service: Jamie [name], [name] 06/19/2022 1:45 PM Medical Record Number: [id number] Patient Account Number: [id number] Date of Birth/Sex: Treating RN: [birth year] (82 y.o. 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 05/22/2022 No Yes lower extremity L97.812 Non-pressure chronic ulcer of other part of right lower leg with fat layer 05/22/2022 No Yes exposed L97.822 Non-pressure chronic ulcer of other part of left lower leg with fat layer exposed2/21/2024 No Yes I25.10 Atherosclerotic heart disease of native coronary artery without angina pectoris 05/22/2022 No Yes Inactive Problems Resolved Problems Electronic Signature(s) Signed: 06/20/2022 3:34:03 PM By: Kalman Shan DO Entered By: Kalman Shan on 06/19/2022  15:36:13 -------------------------------------------------------------------------------- Progress Note Details Patient Name: Date of Service: Jamie [name], [name] 06/19/2022 1:45 PM Medical Record Number: [id number] Patient Account Number: [id number] Date of Birth/Sex: Treating RN: 1941/02/10 (82 y.o. F) Primary Care Provider: Ozella Rocks Other Clinician: Referring Provider: Treating Provider/Extender: Gae Bon in Treatment: 4 Subjective Chief Complaint Information obtained from Patient Bilateral LE Ulcers TRUBY, KILSON ([id number]) [id number] Page 5 of 9 History of Present Illness (HPI) ADMISSION 06/30/2020 Mrs. Eriksen is an 82 year old woman who lives in [place]. She is here with her niece for review of wounds on the left medial lower leg and ankle. These have apparently been present for over a year and she followed with Dr. Juliann Pulse at the [facility] in [place] for quite a period of time although it looks as though there was a initial consult wound from Dr. Ladona Horns on February 18 presumably there was therefore hiatus. At that point the wounds were described as being there for 3 months. She also tells me she was at the wound care center in [place] for a period of time with this. 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 She had venous studies that were negative for DVT ABIs on the right were 1.01 on the left 1.06. She has .  had previous applications of puraply, compression which she does not tolerate very well. She has had several rounds of oral antibiotic therapy. She complains of unrelenting pain and she is seeing Dr. Crist Fat V of pain management apparently was on oxycodone but that did not help. She also had a skin biopsy done by Dr. Juliann Pulse although we do not have that result. She does not appear to have an arterial issue. I am not completely  clear what she has been putting on the wounds lately. 3/31; this patient has a particularly nasty set of wounds on the left medial ankle in the middle of what looks to be hemosiderin deposition secondary to chronic venous insufficiency. She has a lot of pain followed by pain management. Dr. Ladona Horns apparently did a biopsy of something on the left leg last fall what I would like to get this result. She has a history of MRSA treatment. I do not believe she had reflux studies but she did have DVT rule out studies. Previous ABIs have not suggested arterial insufficiency 4/7; difficult wounds on the left medial ankle probably chronic venous insufficiency. With considerable effort on behalf of our case manager we were able to finally to speak to somebody at the hospital in [place] who indicated that no biopsy of this area have been done even though the patient describes this in some detail and is even able to point out where she thinks the biopsy was done. We have been using Sorbact. The PCR culture I did showed polymicrobial identification with Pseudomonas, staph aureus, Peptostreptococcus. All of this and low titers. Resistance genes identified were MRSA, staph virulence gene and tetracycline. We are going to send this to [facility] for a topical antibiotic which is something that we have had good success with recently in large venous ulcers with a lot of purulent drainage. There would not be an easy oral alternative here possibly line escalated and ciprofloxacin if we need to use systemic antibiotic 4/15; difficult area on the left medial ankle. Most likely chronic venous insufficiency. I think she will probably need venous reflux study I think she had DVT rule outs but not venous reflux studies. We have not yet obtained the topical antibiotics. She has home health changing the dressing we have been using Sorbact for adherent fibrinous debris on the surface. Very difficult to remove 4/22; patient presents for  1 week follow-up. She has been using sore back under compression wraps and these are changed 3 times a week with home health. She also had [facility] antibiotics sent to her house and brought them in today. She has no complaints or issues today. 5/2; patient is here for follow-up. She has been using Sorbact under compression. Very painful wound. She has been using Keystone antibiotics. Not much improvement although the more medial part of the wound has cleaned up nicely and the larger part of the wound about 50% slough covered. Part of the issue here is that she had stays at both [facility] wound care center, [facility] wound care center and now [place]. Not sure if she has had venous reflux studies. As far as we are able to tell she did not have a biopsy. My notes state that she did not have venous reflux studies just DVT rule outs. 5/16; patient goes for venous reflux studies this afternoon. She says that wound was biopsied which sounds like punch biopsies by Dr. Tye Maryland we do not have these results. We are using Sorbact. Very difficult wound to debride 5/23; patient presents  for 1 week follow-up. She reports tolerating the wraps well with sorbact underneath. She had ABIs and venous reflux studies done. She has no issues or complaints today. She denies acute signs of infection. 6/6; I have reviewed the patient's vascular studies. Wounds are on the left medial and posterior calf. She had significant reflux in the greater saphenous vein in the in the mid thigh, distal thigh knee and the small saphenous vein in the popliteal fossa. The vein diameters do not look too impressive though. She is going to see the vascular surgeon on Wednesday. She also had venous reflux in the right common femoral vein. She did not have any evidence of a DVT or SVT I am wondering whether there is an ablation procedure that would benefit her in the greater saphenous vein on the left. . She tells [place] that home health put the dressing on  too tight and she took off 1 layer. The swelling in her left leg is a little worse as a result of this. Not much change in the wound measurements so the surface of the wound looks better Her arterial studies showed an ABI on the right of 1.14 with a triphasic waveform and a great toe pressure of 0.89. On the left her ABIs were noncompressible at 1.34 but with triphasic waveforms and a TBI of 0.97. Her greater toe pressure was 122. There was no evidence of significant bilateral arterial disease 6/20 patient went to see Dr. Doren Custard. He did not think she had significant arterial disease. In terms of her venous duplex on the right side there was no evidence of a DVT or SVT there was deep venous reflux involving the common femoral vein no superficial vein reflux on the left side there was no DVT or SVT there was no deep vein graft reflux there was some reflux in the greater saphenous vein from the mid thigh to the knee but the vein here was not dilated. He thought these were venous wounds he prescribed a compression pump but I am not sure who we ordered it from The patient has been approved for Apligraf. Still using silver collagen this week 7/5; Apligraf #1 7/19 Apligraf #2. Decent improvement in the condition of the wound bed. Epithelialization distal 8/2 Apligraf #3. No issues or complaints. Denies signs of infection. 8/17 Apligraf #4. No issues or concerns. Some complaints of pruritus and the rash. Our intake nurse brought up the fact that she had previously indicated possible cotton layer sensitivity we will therefore use kerlix in the bottom layer of the compression 8/30; the patient comes in with the area on her left medial leg just about healed. There is a superficial area more towards the tibia and a smaller open area distally everything else is epithelialized. I do not think she requires another Apligraf 9/13; left medial leg is healed. She has thick areas of chronic hypertrophied skin in this  area as well as likely lipodermatosclerosis. Her edema control is good Readmisstion: 05-22-2022 upon evaluation today patient presents for initial inspection here in our clinic concerning issues that she has been having with a wound of the right lateral lower extremity. This is an area of a previous skin graft she tells me. With that being said she unfortunately had a scrape on this that occurred around 10 January. Since that time she has noted that this has just continued to get bigger and bigger in her niece who is present with her today actually states that 2 weeks ago when she  saw that it was significantly smaller than what she sees currently. Obviously this is of utmost concern as we do not want this to continue to get larger when arrested and get moving in the right direction. Fortunately there does not appear to be any signs of systemic infection though locally I think we probably do have some infection present. I would obtain a culture to see what we have going on here and then we will subsequently see where things go going forward. Fortunately I think that she is in the right place at this point being at the wound center we can definitely do something to try to get this moving in the right direction. Patient does have a history of chronic venous insufficiency as well as coronary artery disease. In the past she has done well with compression wraps on the start with a 3 layer wrap I think she is probably can end up going to a 4-layer at some point but we will see how things do over the next week. She has been using wound cleanser along with she tells me a zinc and topical but again I am not sure exactly what that was. 05-29-2022 upon evaluation today patient appears to be doing well currently in regard to her wound. This actually is showing signs of improvement I am happy in that regard unfortunately her left leg has an area on the shin that has opened. This is the region where one of the areas at  least we have previously taken care of. Nonetheless this is small hoping we get it under control before things worsen significantly. 06-05-2022 upon evaluation today patient appears to be doing well currently in regard to her wounds. The actually seem to be fairly clean she is having still quite a bit of problem with pain at this point she would like to not have debridement today for all possible. With that being said I do believe that we are making some good progress I think the [facility] is doing a decent job here. ABAGALE, MALECKI ([id number]) [id number] Page 6 of 9 3/6; patient has had 3/6; patient with known severe chronic venous insufficiency. She apparently had a traumatic wound at home and has a reasonably substantial wound on the right lateral lower leg and a smaller one on the left anterior lower leg as well. We have been using Hydrofera Blue and Unna boots 3/13; patient presents for follow-up. We have been using Hydrofera Blue under Coflex to the legs bilaterally. She has no issues or complaints today. Patient History Information obtained from Patient. Family History Unknown History. Social History Former smoker, Marital Status - Divorced, Alcohol Use - Never, Drug Use - No History, Caffeine Use - Rarely. 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 Vitals Time Taken: 1:49 AM, Height: 62 in, Weight: 171 lbs, BMI: 31.3, Temperature: 97.7 F, Pulse: 56 bpm, Respiratory Rate: 20 breaths/min, Blood Pressure: 106/61  mmHg. Cardiovascular 2+ dorsalis pedis/posterior tibialis pulses. Psychiatric pleasant and cooperative. General Notes: T the right lateral lower extremity there is a small open wound with granulation tissue throughout. T the left lower extremity there is a large o o open wound with granulation tissue and tightly adhered nonviable  tissue. No signs of infection. Good edema control. 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 Integumentary (Hair, Skin) Wound #5 status is Open. Original cause of wound was Trauma. The date acquired was: 04/17/2022. The wound has been in treatment 4 weeks. The wound is located on the Right,Lateral Lower Leg. The wound measures 9.5cm length x 5cm width x 0.2cm depth; 37.306cm^2 area and 7.461cm^3 volume. There is Fat Layer (Subcutaneous Tissue) exposed. There is a medium amount of serosanguineous drainage noted. There is large (67-100%) red, friable granulation within the wound bed. There is a small (1-33%) amount of necrotic tissue within the wound bed including Adherent Slough. The periwound skin appearance did not exhibit: Callus, Crepitus, Excoriation, Induration, Rash, Scarring, Dry/Scaly, Maceration, Atrophie Blanche, Cyanosis, Ecchymosis, Hemosiderin Staining, Mottled, Pallor, Rubor, Erythema. Wound #6 status is Open. Original cause of wound was Shear/Friction. The date acquired was: 05/22/2022. The wound has been in treatment 3 weeks. The wound is located on the Left,Anterior Lower Leg. The wound measures 0.5cm length x 0.5cm width x 0.1cm depth; 0.196cm^2 area and 0.02cm^3 volume. There is Fat Layer (Subcutaneous Tissue) exposed. There is a medium amount of serosanguineous drainage noted. There is large (67-100%) red, pink granulation within the wound bed. There is no necrotic tissue within the wound bed. The periwound skin appearance did not exhibit: Callus, Crepitus, Excoriation, Induration, Rash, Scarring, Dry/Scaly, Maceration, Atrophie  Blanche, Cyanosis, Ecchymosis, Hemosiderin Staining, Mottled, Pallor, Rubor, Erythema. 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 I recommended continuing Hydrofera Blue to the wound bed. Will add antibiotic ointment to the right lower extremity wound. Continue compression therapy. Follow-up in 1 week. Procedures Wound #5 Pre-procedure diagnosis of Wound #5 is a Venous Leg Ulcer located on the Right,Lateral Lower Leg . There was a Haematologist Compression Therapy Procedure by Rhae Hammock, RN. Post procedure Diagnosis Wound #5: Same as Pre-Procedure Wound #6 Pre-procedure diagnosis of Wound #6 is a Venous Leg Ulcer located on the Left,Anterior Lower Leg . There was a Haematologist Compression Therapy Procedure by Rhae Hammock, RN. Post procedure Diagnosis Wound #6: Same as Pre-Procedure Plan Follow-up Appointments: Return Appointment in 1 week. Margarita [facility]zzle and [facility] Wednesday 06/26/2022 1015 room 8 Return Appointment in 2 weeks. Jeri Cos and [facility] Rm # 8 Wednesday 07/03/22 @ 10:15 Anesthetic: Wound #5 Right,Lateral Lower Leg: (In clinic) Topical Lidocaine 4% applied to wound bed Bathing/ Shower/ Hygiene: May shower with protection but do not get wound dressing(s) wet. Protect dressing(s) with water repellant cover (for example, large plastic bag) or a cast cover and may then take shower. - may purchase cast protector from [facility], [facility] or [employer] Edema Control - Lymphedema / SCD / Other: Elevate legs to the level of the heart or above for 30  minutes daily and/or when sitting for 3-4 times a day throughout the day. Avoid standing for long periods of time. WOUND #5: - Lower Leg Wound Laterality: Right, Lateral Cleanser: Soap and Water 1 x Per Week/30 Days Discharge Instructions: May shower and wash wound with dial antibacterial soap and water prior to dressing change. Peri-Wound Care: Sween Lotion (Moisturizing lotion) 1 x Per Week/30 Days Discharge  Instructions: Apply moisturizing lotion as directed Topical: Gentamicin 1 x Per Week/30 Days Discharge Instructions: As directed by physician Topical: Mupirocin Ointment 1 x Per Week/30 Days Discharge Instructions: Apply Mupirocin (Bactroban) as instructed Prim Dressing: Hydrofera Blue Ready Transfer Foam, 4x5 (in/in) 1 x Per Week/30 Days ary Discharge Instructions: Apply to wound bed as instructed Secondary Dressing: ABD Pad, 8x10 1 x Per Week/30 Days Discharge Instructions: Apply over primary dressing as directed. Com pression Wrap: CoFlex Calamine Unna Boot 4 x 6 (in/yd) 1 x Per Week/30 Days Discharge Instructions: Apply Coflex [employer] as directed. WOUND #6: - Lower Leg Wound Laterality: Left, Anterior Cleanser: Soap and Water 1 x Per Week/30 Days Discharge Instructions: May shower and wash wound with dial antibacterial soap and water prior to dressing change. Peri-Wound Care: Sween Lotion (Moisturizing lotion) 1 x Per Week/30 Days Discharge Instructions: Apply moisturizing lotion as directed Topical: Gentamicin 1 x Per Week/30 Days Discharge Instructions: As directed by physician Topical: Mupirocin Ointment 1 x Per Week/30 Days Discharge Instructions: Apply Mupirocin (Bactroban) as instructed Prim Dressing: Hydrofera Blue Ready Transfer Foam, 4x5 (in/in) 1 x Per Week/30 Days ary Discharge Instructions: Apply to wound bed as instructed Secondary Dressing: ABD Pad, 8x10 1 x Per Week/30 Days Discharge Instructions: Apply over primary dressing as directed. Com  pression Wrap: CoFlex Calamine Unna Boot 4 x 6 (in/yd) 1 x Per Week/30 Days Discharge Instructions: Apply Coflex [employer] as directed. 1. Hydrofera Blue 2. Buttock ointment 3. Coflex to lower extremities bilaterally 4. Follow-up in 1 week Electronic Signature(s) Signed: 06/20/2022 3:34:03 PM By: Kalman Shan DO Entered By: Kalman Shan on 06/19/2022 15:40:44 Chryl Heck ([id number]) [id number] Page 8 of 9 -------------------------------------------------------------------------------- HxROS Details Patient Name: Date of Service: Jamie [name], [place]. 06/19/2022 1:45 PM Medical Record Number: [id number] Patient Account Number: [id number] Date of Birth/Sex: Treating RN: 15-Feb-1941 (82 y.o. F) Primary Care Provider: Ozella Rocks Other Clinician: Referring Provider: Treating Provider/Extender: Gae Bon in Treatment: 4 Information Obtained From Patient Eyes Medical History: Past Medical History Notes: vein occulusion to right eye Respiratory Medical History: Positive for: Sleep Apnea Cardiovascular Medical History: Positive for: Hypertension; Peripheral Venous Disease Past Medical History Notes: Heart murmur Musculoskeletal Medical History: Positive for: Osteoarthritis Neurologic Medical History: Positive for: Neuropathy Psychiatric Medical History: Past Medical History Notes: Anxiety, depression Immunizations Pneumococcal Vaccine: Received Pneumococcal Vaccination: Yes Received Pneumococcal Vaccination On or After 60th Birthday: No Implantable Devices None Family and Social History Unknown History: Yes; Former smoker; Marital Status - Divorced; Alcohol Use: Never; Drug Use: No History; Caffeine Use: Rarely; Financial Concerns: No; Food, Clothing or Shelter Needs: No; Support System Lacking: No; Transportation Concerns: No Electronic Signature(s) Signed:  06/20/2022 3:34:03 PM By: Kalman Shan DO Entered By: Kalman Shan on 06/19/2022 15:37:56 -------------------------------------------------------------------------------- SuperBill Details Patient Name: Date of Service: Jamie [name], Mattie Marlin. 06/19/2022 Medical Record Number: [id number] Patient Account Number: [id number] Date of Birth/Sex: Treating RN: [birth year] (82 y.o. Tonita Phoenix, Lauren Primary Care Provider: Ozella Rocks Other Clinician: Referring Provider: Treating Provider/Extender: Gae Bon in Treatment: [address] MEL[place], COPPINS ([id number]) [id number] Page 9 of 9 Diagnosis Coding ICD-10 Codes Code Description I87.331 Chronic venous hypertension (idiopathic) with ulcer and inflammation of right lower extremity 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d L97.822 Non-pressure chronic ulcer of other part of left lower leg with fat layer exposed I25.10 Atherosclerotic heart disease of native coronary artery without angina pectoris Facility  Procedures : CPT4 Code: [id number] Description: 29580 - APPLY Louretta Parma BOOT/PROFO BILATERAL Modifier: Quantity: 1 Physician Procedures : CPT4 Code Description Modifier [id number] - WC PHYS LEVEL 3 - EST PT ICD-10 Diagnosis Description I87.331 Chronic venous hypertension (idiopathic) with ulcer and inflammation of right lower extremity L97.812 Non-pressure chronic ulcer of other part  of right lower leg with fat layer exposed L97.822 Non-pressure chronic ulcer of other part of left lower leg with fat layer exposed I25.10 Atherosclerotic heart disease of native coronary artery without angina pectoris Quantity: 1 Electronic Signature(s) Signed: 06/20/2022 3:34:03 PM By: Kalman Shan DO Entered By: Kalman Shan on 06/19/2022 15:40:55

## 2022-06-21 NOTE — Progress Notes (Signed)
CYRIAH, WEINAND (ID:[id number]) [id number] Page 1 of 10 Visit Report for 06/19/2022 Arrival Information Details Patient Name: Date of Service: Jamie [name], Jamie [name]. 06/19/2022 1:45 PM Medical Record Number: [id number] Patient Account Number: [id number] Date of Birth/Sex: Treating RN: May 16, 1940 (82 y.o. F) Primary Care Dario Yono: Ozella Rocks Other Clinician: Referring Jennye Runquist: Treating Jiselle Sheu/Extender: Gae Bon in Treatment: 4 Visit Information History Since Last Visit All ordered tests and consults were completed: No Patient Arrived: Wheel Chair Added or deleted any medications: No Arrival Time: 13:48 Any new allergies or adverse reactions: No Accompanied By: niece Had a fall or experienced change in No Transfer Assistance: None activities of daily living that may affect Patient Identification Verified: Yes risk of falls: Secondary Verification Process Completed: Yes Signs or symptoms of abuse/neglect since last visito No Hospitalized since last visit: No Implantable device outside of the clinic excluding No cellular tissue based products placed in the center since last visit: Pain Present Now: No Electronic Signature(s) Signed: 06/19/2022 4:14:01 PM By: Worthy Rancher Entered By: Worthy Rancher on 06/19/2022 13:49:23 -------------------------------------------------------------------------------- Compression Therapy Details Patient Name: Date of Service: Jamie [name]. 06/19/2022 1:45 PM Medical Record Number: [id number] Patient Account Number: [id number] Date of Birth/Sex: Treating RN: January 29, 1941 (82 y.o. Benjaman Lobe Primary Care Tylie Golonka: Ozella Rocks Other Clinician: Referring Jeison Delpilar: Treating Yanni Ruberg/Extender: Kerrie Buffalo Weeks in Treatment: 4 Compression Therapy Performed for Wound Assessment: Wound #5 Right,Lateral Lower  Leg Performed By: Clinician Rhae Hammock, RN Compression Type: Rolena Infante Post Procedure Diagnosis Same as Pre-procedure Electronic Signature(s) Signed: 06/19/2022 4:00:04 PM By: Rhae Hammock RN Entered By: Rhae Hammock on 06/19/2022 14:39:57 -------------------------------------------------------------------------------- Compression Therapy Details Patient Name: Date of Service: Jamie [name], Jamie [name]. 06/19/2022 1:45 PM Medical Record Number: [id number] Patient Account Number: [id number] Date of Birth/Sex: Treating RN: 10-19-1940 (82 y.o. Benjaman Lobe Primary Care Sharaya Boruff: Ozella Rocks Other Clinician: Referring Aleera Gilcrease: Treating Valerya Maxton/Extender: Kerrie Buffalo Weeks in Treatment: 4 Compression Therapy Performed for Wound Assessment: Wound #6 Left,Anterior Lower Leg Performed By: Clinician Rhae Hammock, RN Compression Type: Rolena Infante Post Procedure Diagnosis Same as Jamie [name], Jamie [name] ([id number]) [id number] Page 2 of 10 Electronic Signature(s) Signed: 06/19/2022 4:00:04 PM By: Rhae Hammock RN Entered By: Rhae Hammock on 06/19/2022 14:39:57 -------------------------------------------------------------------------------- Encounter Discharge Information Details Patient Name: Date of Service: Jamie [name], Jamie Dark W. 06/19/2022 1:45 PM Medical Record Number: [id number] Patient Account Number: [id number] Date of Birth/Sex: Treating RN: 11/18/1940 (82 y.o. Tonita Phoenix, Lauren Primary Care Thorsten Climer: Ozella Rocks Other Clinician: Referring Azra Abrell: Treating Decorian Schuenemann/Extender: Gae Bon in Treatment: 4 Encounter Discharge Information Items Discharge Condition: Stable Ambulatory Status: Ambulatory Discharge Destination: Home Transportation: Private Auto Accompanied By: family Schedule Follow-up Appointment:  Yes Clinical Summary of Care: Patient Declined Electronic Signature(s) Signed: 06/19/2022 4:00:04 PM By: Rhae Hammock RN Entered By: Rhae Hammock on 06/19/2022 14:41:45 -------------------------------------------------------------------------------- Lower Extremity Assessment Details Patient Name: Date of Service: Jamie [name]. 06/19/2022 1:45 PM Medical Record Number: [id number] Patient Account Number: [id number] Date of Birth/Sex: Treating RN: 1940-08-31 (82 y.o. Tonita Phoenix, Lauren Primary Care Dessa Ledee: Ozella Rocks Other Clinician: Referring Mayco Walrond: Treating Glendon Dunwoody/Extender: Kerrie Buffalo Weeks in Treatment: 4 Edema Assessment Assessed: [Left: Yes] [Right: Yes] Edema: [Left: Yes] [Right: Yes] Calf Left: Right: Point of Measurement: From Medial Instep 35.5 cm 36.4 cm Ankle Left: Right: Point of Measurement: From Medial Instep 22 cm 24.3 cm Vascular Assessment Pulses: Dorsalis Pedis Palpable: [Left:Yes] [Right:Yes] Posterior Tibial  Palpable: [Left:Yes] [Right:Yes] Electronic Signature(s) Signed: 06/19/2022 4:00:04 PM By: Rhae Hammock RN Entered By: Rhae Hammock on 06/19/2022 14:24:38 Multi Wound Chart Details -------------------------------------------------------------------------------- Jamie [name] ([id number]) [id number] Page 3 of 10 Patient Name: Date of Service: Jamie [name], Jamie [name]. 06/19/2022 1:45 PM Medical Record Number: [id number] Patient Account Number: [id number] Date of Birth/Sex: Treating RN: [birth year] (82 y.o. 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 N/A Primary Etiology: Sleep Apnea, Hypertension, Peripheral Sleep Apnea, Hypertension, Peripheral N/A Comorbid History: Venous Disease, Osteoarthritis, Venous Disease, Osteoarthritis, Neuropathy Neuropathy 04/17/2022 05/22/2022 N/A Date Acquired: 4 3 N/A Weeks of Treatment: Open Open N/A Wound Status: No No N/A Wound Recurrence: 9.5x5x0.2 0.5x0.5x0.1 N/A Measurements L x W x D (cm) 37.306 0.196 N/A A (cm) : rea 7.461 0.02 N/A Volume (cm) : -42.60% 40.60% N/A % Reduction in Area: 5.00% 39.40% N/A % Reduction in Volume: Full Thickness Without Exposed Partial Thickness N/A Classification: Support Structures Medium Medium N/A Exudate Amount: Serosanguineous Serosanguineous N/A Exudate Type: red, brown red, brown N/A Exudate Color: Large (67-100%) Large (67-100%) N/A Granulation Amount: Red, Friable Red, Pink N/A Granulation Quality: Small (1-33%) None Present (0%) N/A Necrotic Amount: Fat Layer (Subcutaneous Tissue): Yes Fat Layer (Subcutaneous Tissue): Yes N/A Exposed Structures: Fascia: No Tendon: No Muscle: No Joint: No Bone: No None Small (1-33%) N/A Epithelialization: Excoriation: No Excoriation: No N/A Periwound Skin Texture: Induration: No Induration: No Callus: No Callus: No Crepitus: No Crepitus: No Rash: No Rash: No Scarring: No Scarring: No Maceration: No Maceration: No N/A Periwound Skin Moisture: Dry/Scaly: No Dry/Scaly: No Atrophie Blanche: No Atrophie Blanche: No N/A Periwound Skin Color: Cyanosis: No Cyanosis: No Ecchymosis: No Ecchymosis: No Erythema: No Erythema: No Hemosiderin Staining: No Hemosiderin Staining: No Mottled: No Mottled: No Pallor: No Pallor: No Rubor: No Rubor: No Compression  Therapy Compression Therapy N/A Procedures Performed: Treatment Notes Wound #5 (Lower Leg) Wound Laterality: Right, Lateral Cleanser Soap and Water Discharge Instruction: May shower and wash wound with dial antibacterial soap and water prior to dressing change. EVELETT, CHIARA ([id number]) [id number] Page 4 of 10 Peri-Wound Care Sween Lotion (Moisturizing lotion) Discharge Instruction: Apply moisturizing lotion as directed Topical Gentamicin Discharge Instruction: As directed by physician Mupirocin Ointment Discharge Instruction: Apply Mupirocin (Bactroban) as instructed Primary Dressing Hydrofera Blue Ready Transfer Foam, 4x5 (in/in) Discharge Instruction: Apply to wound bed as instructed Secondary Dressing ABD Pad, 8x10 Discharge Instruction: Apply over primary dressing as directed. Secured With Compression Wrap CoFlex Calamine Unna Boot 4 x 6 (in/yd) Discharge Instruction: Apply Coflex Calamine [employer] as directed. Compression Stockings Add-Ons Wound #6 (Lower Leg) Wound Laterality: Left, Anterior Cleanser Soap and Water Discharge Instruction: May shower and wash wound with dial antibacterial soap and water prior to dressing change. Peri-Wound Care Sween Lotion (Moisturizing lotion) Discharge Instruction: Apply moisturizing lotion as directed Topical Gentamicin Discharge Instruction: As directed by physician Mupirocin Ointment Discharge Instruction: Apply Mupirocin (Bactroban) as instructed Primary Dressing Hydrofera Blue Ready Transfer Foam, 4x5 (in/in) Discharge Instruction: Apply to wound bed as instructed Secondary Dressing ABD Pad, 8x10 Discharge Instruction: Apply over primary dressing as directed. Secured With Compression Wrap CoFlex Calamine [employer]  Boot 4 x 6 (in/yd) Discharge Instruction: Apply Coflex Calamine [employer] as directed. Compression Stockings Add-Ons Electronic Signature(s) Signed: 06/20/2022 3:34:03 PM By:  Kalman Shan DO Entered By: Kalman Shan on 06/19/2022 15:36:24 -------------------------------------------------------------------------------- Multi-Disciplinary Care Plan Details Patient Name: Date of Service: Jamie Everts, [name] 06/19/2022 1:45 PM Jamie [name] ([id number]) [id number] Page 5 of 10 Medical Record Number: [id number] Patient Account Number: [id number] Date of Birth/Sex: Treating RN: 05-18-1940 (82 y.o. 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 Compression as ordered Provide education on venous insufficiency Notes: Wound/Skin Impairment Nursing Diagnoses: Impaired tissue integrity Knowledge deficit related to ulceration/compromised skin integrity Goals: Patient/caregiver will verbalize understanding of skin care regimen Date Initiated: 05/22/2022 Target Resolution Date: 07/06/2022 Goal Status: Active Ulcer/skin breakdown will have a volume reduction of 30% by week 4 Date Initiated: 05/22/2022 Target Resolution Date: 07/06/2022 Goal Status: Active Interventions: Assess patient/caregiver ability to obtain necessary supplies Assess patient/caregiver ability to perform ulcer/skin care regimen upon admission and as needed Assess ulceration(s)  every visit Provide education on ulcer and skin care Notes: Electronic Signature(s) Signed: 06/19/2022 4:00:04 PM By: Rhae Hammock RN Entered By: Rhae Hammock on 06/19/2022 14:40:14 -------------------------------------------------------------------------------- Pain Assessment Details Patient Name: Date of Service: Jamie [name], Jamie [name]. 06/19/2022 1:45 PM Medical Record Number: [id number] Patient Account Number: [id number] Date of Birth/Sex: Treating RN: [birth year] (82 y.o. F) Primary Care Jinelle Butchko: Ozella Rocks Other Clinician: Referring Macaila Tahir: Treating Peaches Vanoverbeke/Extender: Kerrie Buffalo Weeks in Treatment: 4 Active Problems Location of Pain Severity and Description of Pain Patient Has Paino Yes Site Locations Rate the pain. Jamie [name], Jamie [name] ([id number]) [id number] Page 6 of 10 Rate the pain. Current Pain Level: Insensate Worst Pain Level: 10 Least Pain Level: 0 Tolerable Pain Level: 3 Pain Management and Medication Current Pain Management: Electronic Signature(s) Signed: 06/19/2022 4:14:01 PM By: Worthy Rancher Entered By: Worthy Rancher on 06/19/2022 13:50:08 -------------------------------------------------------------------------------- Patient/Caregiver Education Details Patient Name: Date of Service: Jamie [name] 3/13/2024andnbsp1:45 PM Medical Record Number: [id number] Patient Account Number: [id number] Date of Birth/Gender: Treating RN: [birth year] (82 y.o. Benjaman Lobe Primary Care Physician: Ozella Rocks Other Clinician: Referring Physician: Treating Physician/Extender: Gae Bon in Treatment: 4 Education Assessment Education Provided To: Patient Education Topics Provided Wound/Skin Impairment: Methods: Explain/Verbal Responses: Reinforcements needed, State content correctly Electronic Signature(s) Signed:  06/19/2022 4:00:04 PM By: Rhae Hammock RN Entered By: Rhae Hammock on 06/19/2022 14:40:52 -------------------------------------------------------------------------------- Wound Assessment Details Patient Name: Date of Service: Jamie [name], Jamie [name]. 06/19/2022 1:45 PM Medical Record Number: [id number] Patient Account Number: [id number] Date of Birth/Sex: Treating RN: 27-Aug-1940 (82 y.o. F) Primary Care Jamea Robicheaux: Ozella Rocks Other Clinician: Referring Alfard Cochrane: Treating Krishna Heuer/Extender: Kerrie Buffalo Weeks in Treatment: 4 Wound Status Wound Number: 5 Primary Venous Leg Ulcer Etiology: Wound Location: Right, Lateral Lower Leg Wound Open Wounding Event: Trauma Status: Date Acquired: 04/17/2022 Comorbid Sleep Apnea, Hypertension, Peripheral Venous Disease, Weeks Of Treatment: 4 History: Osteoarthritis, Neuropathy Clustered Wound: No Jamie [name], Jamie [name] ([id number]) [id number] Page 7 of 10  Photos Wound Measurements Length: (cm) 9.5 Width: (cm) 5 Depth: (cm) 0.2 Area: (cm) 37.306 Volume: (cm) 7.461 % Reduction in Area: -42.6% % Reduction in Volume: 5% Epithelialization: None Wound Description Classification: Full Thickness Without Exposed Support Exudate Amount: Medium Exudate Type: Serosanguineous Exudate Color: red, brown Structures Foul Odor After Cleansing: No Slough/Fibrino Yes Wound Bed Granulation Amount: Large (67-100%) Exposed Structure Granulation Quality: Red, Friable Fascia Exposed: No Necrotic Amount: Small (1-33%) Fat Layer (Subcutaneous Tissue) Exposed: Yes Necrotic Quality: Adherent Slough Tendon Exposed: No Muscle Exposed: No Joint Exposed: No Bone Exposed: No Periwound Skin Texture Texture Color No Abnormalities Noted: No No Abnormalities Noted: No Callus: No Atrophie Blanche: No Crepitus: No Cyanosis: No Excoriation: No Ecchymosis: No Induration: No Erythema:  No Rash: No Hemosiderin Staining: No Scarring: No Mottled: No Pallor: No Moisture Rubor: No No Abnormalities Noted: No Dry / Scaly: No Maceration: No Treatment Notes Wound #5 (Lower Leg) Wound Laterality: Right, Lateral Cleanser Soap and Water Discharge Instruction: May shower and wash wound with dial antibacterial soap and water prior to dressing change. Peri-Wound Care Sween Lotion (Moisturizing lotion) Discharge Instruction: Apply moisturizing lotion as directed Topical Gentamicin Discharge Instruction: As directed by physician Mupirocin Ointment Discharge Instruction: Apply Mupirocin (Bactroban) as instructed Primary Dressing Hydrofera Blue Ready Transfer Foam, 4x5 (in/in) Discharge Instruction: Apply to wound bed as instructed Jamie [name], Jamie [name] ([id number]) [id number] Page 8 of 10 Secondary Dressing ABD Pad, 8x10 Discharge Instruction: Apply over primary dressing as directed. Secured With Compression Wrap CoFlex Calamine Unna Boot 4 x 6 (in/yd) Discharge Instruction: Apply Coflex [employer] as directed. Compression Stockings Add-Ons Electronic Signature(s) Signed: 06/19/2022 4:14:01 PM By: Worthy Rancher Entered By: Worthy Rancher on 06/19/2022 14:05:28 -------------------------------------------------------------------------------- Wound Assessment Details Patient Name: Date of Service: Jamie [name], Jamie [name]. 06/19/2022 1:45 PM Medical Record Number: [id number] Patient Account Number: [id number] Date of Birth/Sex: Treating RN: [birth year] (82 y.o. F) Primary Care Hubert Raatz: Ozella Rocks Other Clinician: Referring Dabney Dever: Treating Adrick Kestler/Extender: Kerrie Buffalo Weeks in Treatment: 4 Wound Status Wound Number: 6 Primary Venous Leg Ulcer Etiology: Wound Location: Left, Anterior Lower Leg Wound Open Wounding Event: Shear/Friction Status: Date Acquired: 05/22/2022 Comorbid Sleep Apnea,  Hypertension, Peripheral Venous Disease, Weeks Of Treatment: 3 History: Osteoarthritis, Neuropathy Clustered Wound: No Photos Wound Measurements Length: (cm) 0.5 Width: (cm) 0.5 Depth: (cm) 0.1 Area: (cm) 0.196 Volume: (cm) 0.02 % Reduction in Area: 40.6% % Reduction in Volume: 39.4% Epithelialization: Small (1-33%) Wound Description Classification: Partial Thickness Exudate Amount: Medium Exudate Type: Serosanguineous Exudate Color: red, brown Foul Odor After Cleansing: No Slough/Fibrino Yes Wound Bed Granulation Amount: Large (67-100%) Exposed Structure Granulation Quality: Red, Pink Fat Layer (Subcutaneous Tissue) Exposed: Yes Necrotic Amount: None Present (0%) Periwound Skin Texture Texture Color No Abnormalities Noted: No No Abnormalities Noted: No Jamie [name], Jamie [name] ([id number]) [id number] Page 9 of 10 Callus: No Atrophie Blanche: No Crepitus: No Cyanosis: No Excoriation: No Ecchymosis: No Induration: No Erythema: No Rash: No Hemosiderin Staining: No Scarring: No Mottled: No Pallor: No Moisture Rubor: No No Abnormalities Noted: No Dry / Scaly: No Maceration: No Treatment Notes Wound #6 (Lower Leg) Wound Laterality: Left, Anterior Cleanser Soap and Water Discharge Instruction: May shower and wash wound with dial antibacterial soap and water prior to dressing change. Peri-Wound Care Sween Lotion (Moisturizing lotion) Discharge Instruction: Apply moisturizing lotion as directed Topical Gentamicin Discharge Instruction: As directed by physician Mupirocin Ointment Discharge Instruction: Apply Mupirocin (Bactroban) as instructed Primary Dressing Hydrofera Blue Ready Transfer Foam, 4x5 (in/in) Discharge Instruction: Apply  to wound bed as instructed Secondary Dressing ABD Pad, 8x10 Discharge Instruction: Apply over primary dressing as directed. Secured With Compression Wrap CoFlex Calamine Unna Boot 4 x 6  (in/yd) Discharge Instruction: Apply Coflex [employer] as directed. Compression Stockings Add-Ons Electronic Signature(s) Signed: 06/19/2022 4:14:01 PM By: Worthy Rancher Entered By: Worthy Rancher on 06/19/2022 14:04:43 -------------------------------------------------------------------------------- Vitals Details Patient Name: Date of Service: Jamie Everts, [name] 06/19/2022 1:45 PM Medical Record Number: [id number] Patient Account Number: [id number] Date of Birth/Sex: Treating RN: Jun 10, 1940 (82 y.o. F) Primary Care Jaelynn [name]: Ozella Rocks Other Clinician: Referring Delora Gravatt: Treating Bakari Nikolai/Extender: Kerrie Buffalo Weeks in Treatment: 4 Vital Signs Time Taken: 01:49 Temperature (F): 97.7 Height (in): 62 Pulse (bpm): 56 Weight (lbs): 171 Respiratory Rate (breaths/min): 20 Body Mass Index (BMI): 31.3 Blood Pressure (mmHg): 106/61 Reference Range: 80 - 120 mg / dl Electronic Signature(s) Signed: 06/19/2022 4:14:01 PM By: Jamie [name] ([id number]) [id number] Page 10 of 10 Entered By: Worthy Rancher on 06/19/2022 13:49:47

## 2022-06-26 ENCOUNTER — Encounter (HOSPITAL_BASED_OUTPATIENT_CLINIC_OR_DEPARTMENT_OTHER): Payer: Medicare HMO | Admitting: Physician Assistant

## 2022-06-26 DIAGNOSIS — I87331 Chronic venous hypertension (idiopathic) with ulcer and inflammation of right lower extremity: Secondary | ICD-10-CM | POA: Diagnosis not present

## 2022-06-27 NOTE — Progress Notes (Addendum)
KOREENA, NOGLE ([id number]) [id number] Page 1 of 8 Visit Report for 06/26/2022 Chief Complaint Document Details Patient Name: Date of Service: Jamie [name], [place] 06/26/2022 10:15 A M Medical Record Number: [id number] Patient Account Number: [id number] Date of Birth/Sex: Treating RN: 07/24/1940 (82 y.o. F) Primary Care Provider: Ozella Rocks Other Clinician: Referring Provider: Treating Provider/Extender: Alfredo Martinez, [name] in Treatment: 5 Information Obtained from: Patient Chief Complaint Bilateral LE Ulcers Electronic Signature(s) Signed: 06/26/2022 10:31:36 AM By: Worthy Keeler PA-C Entered By: Worthy Keeler on 06/26/2022 10:31:36 -------------------------------------------------------------------------------- HPI Details Patient Name: Date of Service: Jamie [name], [name] 06/26/2022 10:15 A M Medical Record Number: [id number] Patient Account Number: [id number] Date of Birth/Sex: Treating RN: 08-May-1940 (82 y.o. F) Primary Care Provider: Ozella Rocks Other Clinician: Referring Provider: Treating Provider/Extender: Alfredo Martinez, [name] in Treatment: 5 History of Present Illness HPI Description: ADMISSION 06/30/2020 Jamie [name] is an 82 year old woman who lives in [place]. She is here with her niece for review of wounds on the left medial lower leg and ankle. These have apparently been present for over a year and she followed with Dr. Juliann Pulse at the [facility] in [place] for quite a period of time although it looks as though there was a initial consult wound from Dr. Ladona Horns on February 18 presumably there was therefore hiatus. At that point the wounds were described as being there for 3 months. She also tells me she was at the wound care center in [place] for a period of time with this. 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 She had venous studies that were negative for DVT ABIs on the right were 1.01 on the left 1.06. She has . had previous applications of puraply, compression which she does not tolerate very well. She has had several rounds of oral antibiotic therapy. She complains of unrelenting pain and she is seeing Dr. Crist Fat V of pain management apparently was on oxycodone but that did not help. She also had a skin biopsy done by Dr. Juliann Pulse although we do not have that result. She does not appear to have an arterial issue. I am not completely clear what she has been putting on the wounds lately. 3/31; this patient has a particularly nasty set of wounds on the left medial ankle in the middle of what looks to be hemosiderin deposition secondary to chronic venous insufficiency. She has a lot of pain followed by pain management. Dr. Ladona Horns apparently did a biopsy of something on the left leg last fall what I would like to get this result. She has a history of MRSA treatment. I do not believe she had reflux studies but she did have DVT rule out studies. Previous ABIs have not suggested arterial insufficiency 4/7; difficult wounds on the left medial ankle probably chronic venous insufficiency. With considerable effort on behalf of our case manager we were able to finally to speak to somebody at the hospital in [place] who indicated that no biopsy of this area have been done even though the patient describes this in some detail and is even able to point out where she thinks the biopsy was done. We have been using Sorbact. The PCR culture I did showed polymicrobial identification with Pseudomonas, staph aureus, Peptostreptococcus. All of this and low titers. Resistance genes identified were MRSA, staph virulence gene and tetracycline. We are going to send this to [facility] for a topical antibiotic which  is something that we have had good success with recently in large venous ulcers with a lot of  purulent drainage. There would not be an easy oral alternative here possibly line escalated and ciprofloxacin if we need to use systemic antibiotic 4/15; difficult area on the left medial ankle. Most likely chronic venous insufficiency. I think she will probably need venous reflux study I think she had DVT rule outs but not venous reflux studies. We have not yet obtained the topical antibiotics. She has home health changing the dressing we have been using Sorbact for adherent fibrinous debris on the surface. Very difficult to remove 4/22; patient presents for 1 week follow-up. She has been using sore back under compression wraps and these are changed 3 times a week with home health. She also had Keystone antibiotics sent to her house and brought them in today. She has no complaints or issues today. 5/2; patient is here for follow-up. She has been using Sorbact under compression. Very painful wound. She has been using Keystone antibiotics. Not much improvement although the more medial part of the wound has cleaned up nicely and the larger part of the wound about 50% slough covered. Part of the issue here is that she had stays at both [facility] wound care center, [facility] wound care center and now [place]. Not sure if she has had venous reflux studies. As far as we are able to tell she did not have a biopsy. My notes state that she did not have venous reflux studies just DVT rule outs. 5/16; patient goes for venous reflux studies this afternoon. She says that wound was biopsied which sounds like punch biopsies by Dr. Tye Maryland we do not have these results. We are using Sorbact. Very difficult wound to debride 5/23; patient presents for 1 week follow-up. She reports tolerating the wraps well with sorbact underneath. She had ABIs and venous reflux studies done. She IRMA, BYERLY ([id number]) [id number] Page 2 of 8 has no issues or complaints today. She denies acute signs of  infection. 6/6; I have reviewed the patient's vascular studies. Wounds are on the left medial and posterior calf. She had significant reflux in the greater saphenous vein in the in the mid thigh, distal thigh knee and the small saphenous vein in the popliteal fossa. The vein diameters do not look too impressive though. She is going to see the vascular surgeon on Wednesday. She also had venous reflux in the right common femoral vein. She did not have any evidence of a DVT or SVT I am wondering whether there is an ablation procedure that would benefit her in the greater saphenous vein on the left. . She tells [place] that home health put the dressing on too tight and she took off 1 layer. The swelling in her left leg is a little worse as a result of this. Not much change in the wound measurements so the surface of the wound looks better Her arterial studies showed an ABI on the right of 1.14 with a triphasic waveform and a great toe pressure of 0.89. On the left her ABIs were noncompressible at 1.34 but with triphasic waveforms and a TBI of 0.97. Her greater toe pressure was 122. There was no evidence of significant bilateral arterial disease 6/20 patient went to see Dr. Doren Custard. He did not think she had significant arterial disease. In terms of her venous duplex on the right side there was no evidence of a DVT or SVT there was deep venous reflux involving  the common femoral vein no superficial vein reflux on the left side there was no DVT or SVT there was no deep vein graft reflux there was some reflux in the greater saphenous vein from the mid thigh to the knee but the vein here was not dilated. He thought these were venous wounds he prescribed a compression pump but I am not sure who we ordered it from The patient has been approved for Apligraf. Still using silver collagen this week 7/5; Apligraf #1 7/19 Apligraf #2. Decent improvement in the condition of the wound bed. Epithelialization distal 8/2  Apligraf #3. No issues or complaints. Denies signs of infection. 8/17 Apligraf #4. No issues or concerns. Some complaints of pruritus and the rash. Our intake nurse brought up the fact that she had previously indicated possible cotton layer sensitivity we will therefore use kerlix in the bottom layer of the compression 8/30; the patient comes in with the area on her left medial leg just about healed. There is a superficial area more towards the tibia and a smaller open area distally everything else is epithelialized. I do not think she requires another Apligraf 9/13; left medial leg is healed. 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 Her edema control is good Readmisstion: 05-22-2022 upon evaluation today patient presents for initial inspection here in our clinic concerning issues that she has been having with a wound of the right lateral lower extremity. This is an area of a previous skin graft she tells me. With that being said she unfortunately had a scrape on this that occurred around 10 January. Since that time she has noted that this has just continued to get bigger and bigger in her niece who is present with her today actually states that 2 weeks ago when she saw that it was significantly smaller than what she sees currently. Obviously this is of utmost concern as we do not want this to continue to get larger when arrested and get moving in the right direction. Fortunately there does not appear to be any signs of systemic infection though locally I think we probably do have some infection present. I would obtain a culture to see what we have going on here and then we will subsequently see where things go going forward. Fortunately I think that she is in the right place at this point being at the wound center we can definitely do something to try to get this moving in the right direction. Patient does have a history of chronic venous  insufficiency as well as coronary artery disease. In the past she has done well with compression wraps on the start with a 3 layer wrap I think she is probably can end up going to a 4-layer at some point but we will see how things do over the next week. She has been using wound cleanser along with she tells me a zinc and topical but again I am not sure exactly what that was. 05-29-2022 upon evaluation today patient appears to be doing well currently in regard to her wound. This actually is showing signs of improvement I am happy in that regard unfortunately her left leg has an area on the shin that has opened. This is the region where one of the areas at least we have previously taken care of. Nonetheless this is small hoping we get it under control before things worsen significantly. 06-05-2022 upon evaluation today patient appears to be doing well currently  in regard to her wounds. The actually seem to be fairly clean she is having still quite a bit of problem with pain at this point she would like to not have debridement today for all possible. With that being said I do believe that we are making some good progress I think the [facility] is doing a decent job here. 3/6; patient has had 3/6; patient with known severe chronic venous insufficiency. She apparently had a traumatic wound at home and has a reasonably substantial wound on the right lateral lower leg and a smaller one on the left anterior lower leg as well. We have been using Hydrofera Blue and Unna boots 3/13; patient presents for follow-up. We have been using Hydrofera Blue under Coflex to the legs bilaterally. She has no issues or complaints today. 06-26-2022 upon evaluation today patient appears to be doing well currently in regard to her wound. This is measuring a little bit larger however. Fortunately I do not see any signs of infection this is on the right side. Fortunately the left side is actually completely healed which is great  news. Electronic Signature(s) Signed: 06/26/2022 11:12:48 AM By: Worthy Keeler PA-C Entered By: Worthy Keeler on 06/26/2022 11:12:48 -------------------------------------------------------------------------------- Physical Exam Details Patient Name: Date of Service: [name]. 06/26/2022 10:15 A M Medical Record Number: [id number] Patient Account Number: [id number] Date of Birth/Sex: Treating RN: Apr 09, 1940 (82 y.o. F) Primary Care Provider: Ozella Rocks Other Clinician: Referring Provider: Treating Provider/Extender: Alfredo Martinez, [name] in Treatment: 5 Constitutional Well-nourished and well-hydrated in no acute distress. Respiratory normal breathing without difficulty. Psychiatric this patient is able to make decisions and demonstrates good insight into disease process. Alert and Oriented x 3. pleasant and cooperative. Notes KLEE, BREIDING ([id number]) [id number] Page 3 of 8 Upon inspection patient's wound bed actually showed signs of good granulation epithelization at this point. There was some extension of the wound on the right leg distally which I think is due to the fact that her wrap keep sliding down. We did switch to a Unna boot to try to prevent this but still having issue. Fortunately I think that we should be able to see a continuation of therapy and improvement with appropriate decompression without the sliding down on the recommend that we switch however to a Tubigrip as right now to be honest the patient is having issues with pretty much any wrap we put on causing either pain or slipping down on her. I think this is actually caused detriment to the wound. Electronic Signature(s) Signed: 06/26/2022 11:13:45 AM By: Worthy Keeler PA-C Entered By: Worthy Keeler on 06/26/2022 11:13:44 -------------------------------------------------------------------------------- Physician Orders  Details Patient Name: Date of Service: Jamie [name], Mattie Marlin. 06/26/2022 10:15 A M Medical Record Number: [id number] Patient Account Number: [id number] Date of Birth/Sex: Treating RN: 1940/10/11 (82 y.o. Erlene Quan Primary Care Provider: Ozella Rocks Other Clinician: Referring Provider: Treating Provider/Extender: Alfredo Martinez, [name] in Treatment: 5 Verbal / Phone Orders: No Diagnosis Coding ICD-10 Coding Code Description [phone] Chronic venous hypertension (idiopathic) with ulcer and inflammation of right lower extremity 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d L97.822 Non-pressure chronic ulcer of other part of left lower leg with fat layer exposed I25.10 Atherosclerotic heart disease of native coronary artery without angina pectoris Follow-up Appointments ppointment in 1 week. Jeri Cos and [place] Rm # 8 Wednesday 07/03/22 @ 10:15 Return A  ppointment in 2 weeks. Margarita Grizzle Wednesday Room 8 07/10/2022 1015 room 8 Return A Anesthetic Wound #5 Right,Lateral Lower Leg (In clinic) Topical Lidocaine 4% applied to wound bed Bathing/ Shower/ Hygiene May shower with protection but do not get wound dressing(s) wet. Protect dressing(s) with water repellant cover (for example, large plastic bag) or a cast cover and may then take shower. - may purchase cast protector from [facility], [facility] or [employer] Edema Control - Lymphedema / SCD / Other Right Lower Extremity Elevate legs to the level of the heart or above for 30 minutes daily and/or when sitting for 3-4 times a day throughout the day. Avoid standing for long periods of time. Exercise regularly Moisturize legs daily. - Apply to left leg every night before bedtime. Compression stocking or Garment 20-30 mm/Hg pressure to: - Apply to left leg every morning and remove every night. Wound Treatment Wound #5 - Lower Leg Wound Laterality: Right,  Lateral Cleanser: Soap and Water 1 x Per Week/30 Days Discharge Instructions: May shower and wash wound with dial antibacterial soap and water prior to dressing change. Peri-Wound Care: Sween Lotion (Moisturizing lotion) 1 x Per Week/30 Days Discharge Instructions: Apply moisturizing lotion as directed Topical: Gentamicin 1 x Per Week/30 Days Discharge Instructions: As directed by physician Topical: Mupirocin Ointment 1 x Per Week/30 Days Discharge Instructions: Apply Mupirocin (Bactroban) as instructed Prim Dressing: Hydrofera Blue Ready Transfer Foam, 4x5 (in/in) 1 x Per Week/30 Days ary Discharge Instructions: Apply to wound bed as instructed Secondary Dressing: ABD Pad, 8x10 1 x Per Week/30 Days Discharge Instructions: Apply over primary dressing as directed. Secondary Dressing: Zetuvit Plus 4x8 in 1 x Per Week/30 Days Discharge Instructions: Apply over primary dressing as directed. Secured With: [employer], 4.5x3.1 (in/yd) 1 x Per Week/30 Days NYALAH, HANN ([id number]) [id number] Page 4 of 8 Discharge Instructions: Secure with Kerlix as directed. Secured With: 78M Medipore H Soft Cloth Surgical T ape, 4 x 10 (in/yd) 1 x Per Week/30 Days Discharge Instructions: Secure with tape as directed. Compression Wrap: Tubigrip Size D 1 x Per Week/30 Days Discharge Instructions: applied one layer to right leg. Leave in place all week. Wound #6 - Lower Leg Wound Laterality: Left, Anterior Cleanser: Soap and Water 1 x Per Day/30 Days Discharge Instructions: May shower and wash wound with dial antibacterial soap and water prior to dressing change. Peri-Wound Care: Sween Lotion (Moisturizing lotion) 1 x Per Day/30 Days Discharge Instructions: Apply moisturizing lotion as directed Compression Wrap: Tubigrip Size D or compression stocking 1 x Per Day/30 Days Discharge Instructions: Apply in morning and remove at night Electronic Signature(s) Signed:  06/26/2022 5:04:23 PM By: Deon Pilling RN, BSN Signed: 06/26/2022 6:05:31 PM By: Worthy Keeler PA-C Entered By: Deon Pilling on 06/26/2022 12:59:53 -------------------------------------------------------------------------------- Problem List Details Patient Name: Date of Service: Jamie [name], [name] 06/26/2022 10:15 A M Medical Record Number: [id number] Patient Account Number: [id number] Date of Birth/Sex: Treating RN: 1940-04-27 (82 y.o. 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 05/22/2022 No Yes lower extremity L97.812 Non-pressure chronic ulcer of other part of right lower leg with fat layer 05/22/2022 No Yes exposed L97.822 Non-pressure chronic ulcer of other part of left lower leg with fat layer exposed2/21/2024 No Yes I25.10 Atherosclerotic heart disease of native coronary artery without angina  pectoris 05/22/2022 No Yes Inactive Problems Resolved Problems Electronic Signature(s) Signed: 06/26/2022 10:31:29 AM By: Worthy Keeler PA-C Entered By: Worthy Keeler on 06/26/2022 10:31:29 Progress Note Details -------------------------------------------------------------------------------- Chryl Heck ([id number]) [id number] Page 5 of 8 Patient Name: Date of Service: Jamie [name], [place] 06/26/2022 10:15 A M Medical Record Number: [id number] Patient Account Number: [id number] Date of Birth/Sex: Treating RN: 09-23-1940 (82 y.o. F) Primary Care Provider: Ozella Rocks Other Clinician: Referring Provider: Treating Provider/Extender: Alfredo Martinez, [name] in Treatment: 5 Subjective Chief  Complaint Information obtained from Patient Bilateral LE Ulcers History of Present Illness (HPI) ADMISSION 06/30/2020 Mrs. Martinson is an 82 year old woman who lives in [place]. She is here with her niece for review of wounds on the left medial lower leg and ankle. These have apparently been present for over a year and she followed with Dr. Juliann Pulse at the [facility] in [place] for quite a period of time although it looks as though there was a initial consult wound from Dr. Ladona Horns on February 18 presumably there was therefore hiatus. At that point the wounds were described as being there for 3 months. She also tells me she was at the wound care center in [place] for a period of time with this. 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 She had venous studies that were negative for DVT ABIs on the right were 1.01 on the left 1.06. She has . had previous applications of puraply, compression which she does not tolerate very well. She has had several rounds of oral antibiotic therapy. She complains of unrelenting pain and she is seeing Dr. Crist Fat V of pain management apparently was on oxycodone but that did not help. She also had a skin biopsy done by Dr. Juliann Pulse although we do not have that result. She does not appear to have an arterial issue. I am not completely clear what she has been putting on the wounds lately. 3/31; this patient has a particularly nasty set of wounds on the left medial ankle in the middle of what looks to be hemosiderin deposition secondary to chronic venous insufficiency. She has a lot of pain followed by pain management. Dr. Ladona Horns apparently did a biopsy of something on the left leg last fall what I would like to get this result. She has a history of MRSA treatment. I do not believe she had reflux studies but she did have DVT rule out studies. Previous ABIs have not suggested arterial insufficiency 4/7; difficult wounds on  the left medial ankle probably chronic venous insufficiency. With considerable effort on behalf of our case manager we were able to finally to speak to somebody at the hospital in [place] who indicated that no biopsy of this area have been done even though the patient describes this in some detail and is even able to point out where she thinks the biopsy was done. We have been using Sorbact. The PCR culture I did showed polymicrobial identification with Pseudomonas, staph aureus, Peptostreptococcus. All of this and low titers. Resistance genes identified were MRSA, staph virulence gene and tetracycline. We are going to send this to [facility] for a topical antibiotic which is something that we have had good success with recently in large venous ulcers with a lot of purulent drainage. There would not be an easy oral alternative here possibly line escalated and ciprofloxacin if we need to use systemic antibiotic 4/15; difficult area on the left medial ankle.  Most likely chronic venous insufficiency. I think she will probably need venous reflux study I think she had DVT rule outs but not venous reflux studies. We have not yet obtained the topical antibiotics. She has home health changing the dressing we have been using Sorbact for adherent fibrinous debris on the surface. Very difficult to remove 4/22; patient presents for 1 week follow-up. She has been using sore back under compression wraps and these are changed 3 times a week with home health. She also had [facility] antibiotics sent to her house and brought them in today. She has no complaints or issues today. 5/2; patient is here for follow-up. She has been using Sorbact under compression. Very painful wound. She has been using Keystone antibiotics. Not much improvement although the more medial part of the wound has cleaned up nicely and the larger part of the wound about 50% slough covered. Part of the issue here is that she had stays at both [facility]  wound care center, [facility] wound care center and now [place]. Not sure if she has had venous reflux studies. As far as we are able to tell she did not have a biopsy. My notes state that she did not have venous reflux studies just DVT rule outs. 5/16; patient goes for venous reflux studies this afternoon. She says that wound was biopsied which sounds like punch biopsies by Dr. Tye Maryland we do not have these results. We are using Sorbact. Very difficult wound to debride 5/23; patient presents for 1 week follow-up. She reports tolerating the wraps well with sorbact underneath. She had ABIs and venous reflux studies done. She has no issues or complaints today. She denies acute signs of infection. 6/6; I have reviewed the patient's vascular studies. Wounds are on the left medial and posterior calf. She had significant reflux in the greater saphenous vein in the in the mid thigh, distal thigh knee and the small saphenous vein in the popliteal fossa. The vein diameters do not look too impressive though. She is going to see the vascular surgeon on Wednesday. She also had venous reflux in the right common femoral vein. She did not have any evidence of a DVT or SVT I am wondering whether there is an ablation procedure that would benefit her in the greater saphenous vein on the left. . She tells [place] that home health put the dressing on too tight and she took off 1 layer. The swelling in her left leg is a little worse as a result of this. Not much change in the wound measurements so the surface of the wound looks better Her arterial studies showed an ABI on the right of 1.14 with a triphasic waveform and a great toe pressure of 0.89. On the left her ABIs were noncompressible at 1.34 but with triphasic waveforms and a TBI of 0.97. Her greater toe pressure was 122. There was no evidence of significant bilateral arterial disease 6/20 patient went to see Dr. Doren Custard. He did not think she had significant arterial disease. In terms  of her venous duplex on the right side there was no evidence of a DVT or SVT there was deep venous reflux involving the common femoral vein no superficial vein reflux on the left side there was no DVT or SVT there was no deep vein graft reflux there was some reflux in the greater saphenous vein from the mid thigh to the knee but the vein here was not dilated. He thought these were venous wounds he prescribed a compression  pump but I am not sure who we ordered it from The patient has been approved for Apligraf. Still using silver collagen this week 7/5; Apligraf #1 7/19 Apligraf #2. Decent improvement in the condition of the wound bed. Epithelialization distal 8/2 Apligraf #3. No issues or complaints. Denies signs of infection. 8/17 Apligraf #4. No issues or concerns. Some complaints of pruritus and the rash. Our intake nurse brought up the fact that she had previously indicated possible cotton layer sensitivity we will therefore use kerlix in the bottom layer of the compression 8/30; the patient comes in with the area on her left medial leg just about healed. There is a superficial area more towards the tibia and a smaller open area distally everything else is epithelialized. I do not think she requires another Apligraf 9/13; left medial leg is healed. 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 Her edema control is good Readmisstion: 05-22-2022 upon evaluation today patient presents for initial inspection here in our clinic concerning issues that she has been having with a wound of the right lateral lower extremity. This is an area of a previous skin graft she tells me. With that being said she unfortunately had a scrape on this that occurred around 10 January. Since that time she has noted that this has just continued to get bigger and bigger in her niece who is present with her today actually states that 2 weeks ago when she saw that it  was significantly smaller than what she sees currently. Obviously this is of utmost concern as we do not want this to continue LADENA, PENA (ID:[id number]) [id number] Page 6 of 8 to get larger when arrested and get moving in the right direction. Fortunately there does not appear to be any signs of systemic infection though locally I think we probably do have some infection present. I would obtain a culture to see what we have going on here and then we will subsequently see where things go going forward. Fortunately I think that she is in the right place at this point being at the wound center we can definitely do something to try to get this moving in the right direction. Patient does have a history of chronic venous insufficiency as well as coronary artery disease. In the past she has done well with compression wraps on the start with a 3 layer wrap I think she is probably can end up going to a 4-layer at some point but we will see how things do over the next week. She has been using wound cleanser along with she tells me a zinc and topical but again I am not sure exactly what that was. 05-29-2022 upon evaluation today patient appears to be doing well currently in regard to her wound. This actually is showing signs of improvement I am happy in that regard unfortunately her left leg has an area on the shin that has opened. This is the region where one of the areas at least we have previously taken care of. Nonetheless this is small hoping we get it under control before things worsen significantly. 06-05-2022 upon evaluation today patient appears to be doing well currently in regard to her wounds. The actually seem to be fairly clean she is having still quite a bit of problem with pain at this point she would like to not have debridement today for all possible. With that being said I do believe that we are making some good  progress I think the Hydrofera Blue is doing a  decent job here. 3/6; patient has had 3/6; patient with known severe chronic venous insufficiency. She apparently had a traumatic wound at home and has a reasonably substantial wound on the right lateral lower leg and a smaller one on the left anterior lower leg as well. We have been using Hydrofera Blue and Unna boots 3/13; patient presents for follow-up. We have been using Hydrofera Blue under Coflex to the legs bilaterally. She has no issues or complaints today. 06-26-2022 upon evaluation today patient appears to be doing well currently in regard to her wound. This is measuring a little bit larger however. Fortunately I do not see any signs of infection this is on the right side. Fortunately the left side is actually completely healed which is great news. Objective Constitutional Well-nourished and well-hydrated in no acute distress. Vitals Time Taken: 10:36 AM, Height: 62 in, Weight: 171 lbs, BMI: 31.3, Temperature: 96.7 F, Pulse: 45 bpm, Respiratory Rate: 18 breaths/min, Blood Pressure: 152/85 mmHg. Respiratory normal breathing without difficulty. Psychiatric this patient is able to make decisions and demonstrates good insight into disease process. Alert and Oriented x 3. pleasant and cooperative. General Notes: Upon inspection patient's wound bed actually showed signs of good granulation epithelization at this point. There was some extension of the wound on the right leg distally which I think is due to the fact that her wrap keep sliding down. We did switch to a Unna boot to try to prevent this but still having issue. Fortunately I think that we should be able to see a continuation of therapy and improvement with appropriate decompression without the sliding down on the recommend that we switch however to a Tubigrip as right now to be honest the patient is having issues with pretty much any wrap we put on causing either pain or slipping down on her. I think this is actually caused  detriment to the wound. Integumentary (Hair, Skin) Wound #5 status is Open. Original cause of wound was Trauma. The date acquired was: 04/17/2022. The wound has been in treatment 5 weeks. The wound is located on the Right,Lateral Lower Leg. The wound measures 9.4cm length x 4.5cm width x 0.2cm depth; 33.222cm^2 area and 6.644cm^3 volume. There is Fat Layer (Subcutaneous Tissue) exposed. There is no tunneling or undermining noted. There is a medium amount of serosanguineous drainage noted. The wound margin is distinct with the outline attached to the wound base. There is large (67-100%) red, friable granulation within the wound bed. There is a small (1-33%) amount of necrotic tissue within the wound bed including Adherent Slough. The periwound skin appearance exhibited: Scarring, Hemosiderin Staining. The periwound skin appearance did not exhibit: Callus, Crepitus, Excoriation, Induration, Rash, Dry/Scaly, Maceration, Atrophie Blanche, Cyanosis, Ecchymosis, Mottled, Pallor, Rubor, Erythema. The periwound has tenderness on palpation. Wound #6 status is Open. Original cause of wound was Shear/Friction. The date acquired was: 05/22/2022. The wound has been in treatment 4 weeks. The wound is located on the Left,Anterior Lower Leg. The wound measures 0cm length x 0cm width x 0cm depth; 0cm^2 area and 0cm^3 volume. There is Fat Layer (Subcutaneous Tissue) exposed. There is no tunneling or undermining noted. There is a medium amount of serosanguineous drainage noted. The wound margin is distinct with the outline attached to the wound base. There is small (1-33%) pink granulation within the wound bed. There is a medium (34-66%) amount of necrotic tissue within the wound bed. The periwound skin appearance did not  exhibit: Callus, Crepitus, Excoriation, Induration, Rash, Scarring, Dry/Scaly, Maceration, Atrophie Blanche, Cyanosis, Ecchymosis, Hemosiderin Staining, Mottled, Pallor, Rubor,  Erythema. 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 Jeri Cos and [place] Rm # 8 Wednesday 07/03/22 @ 10:15 Return Appointment in 2 weeks. Margarita Grizzle Wednesday Room 8 07/10/2022 1015 room 8 Anesthetic: Wound #5 Right,Lateral Lower Leg: (In clinic) Topical Lidocaine 4% applied to wound bed Bathing/ Shower/ Hygiene: May shower with protection but do not get wound dressing(s) wet. Protect dressing(s) with water repellant cover (for example, large plastic bag) or a cast cover and may then take shower. - may purchase cast protector from [facility], [facility] or [employer] Edema Control - Lymphedema / SCD / Other: Elevate legs to the level of the heart or above for 30 minutes daily and/or when sitting for 3-4 times a day throughout the day. Avoid standing for long periods of time. Exercise regularly Moisturize legs daily. - Apply to left leg every night before bedtime. Compression stocking or Garment 20-30 mm/Hg pressure to: - Apply to left leg every morning and remove every night. WOUND #5: - Lower Leg Wound Laterality: Right, Lateral Cleanser: Soap and Water 1 x Per Week/30 Days Discharge Instructions: May shower and wash wound with dial antibacterial soap and water prior to dressing change. Peri-Wound Care: Sween Lotion (Moisturizing lotion) 1 x Per Week/30 Days Discharge Instructions: Apply moisturizing lotion as directed Topical: Gentamicin 1 x Per Week/30 Days Discharge Instructions: As directed by physician Topical: Mupirocin  Ointment 1 x Per Week/30 Days Discharge Instructions: Apply Mupirocin (Bactroban) as instructed Prim Dressing: Hydrofera Blue Ready Transfer Foam, 4x5 (in/in) 1 x Per Week/30 Days ary Discharge Instructions: Apply to wound bed as instructed Secondary Dressing: ABD Pad, 8x10 1 x Per Week/30 Days Discharge Instructions: Apply over primary dressing as directed. Secondary Dressing: Zetuvit Plus 4x8 in 1 x Per Week/30 Days Discharge Instructions: Apply over primary dressing as directed. Secured With: [employer], 4.5x3.1 (in/yd) 1 x Per Week/30 Days Discharge Instructions: Secure with Kerlix as directed. Secured With: 63M Medipore H Soft Cloth Surgical T ape, 4 x 10 (in/yd) 1 x Per Week/30 Days Discharge Instructions: Secure with tape as directed. Com pression Wrap: Tubigrip Size D 1 x Per Week/30 Days Discharge Instructions: applied one layer to right leg. Leave in place all week. WOUND #6: - Lower Leg Wound Laterality: Left, Anterior Cleanser: Soap and Water 1 x Per Day/30 Days Discharge Instructions: May shower and wash wound with dial antibacterial soap and water prior to dressing change. Peri-Wound Care: Sween Lotion (Moisturizing lotion) 1 x Per Day/30 Days Discharge Instructions: Apply moisturizing lotion as directed Com pression Wrap: Tubigrip Size D or compression stocking 1 x Per Day/30 Days Discharge Instructions: Apply in morning and remove at night 1. I am recommend currently that we have the patient switch to Tubigrip instead of the compression wrapping. She is in agreement with the plan. We will subsequently continue however with the wound care measures as before as I do feel like the dressings have been doing well and she is the matter of keeping the wrap from sliding down. 2. I am good  recommend as well that we have the patient continue with the [facility] which I think is doing a good job on the right leg on the left we just really need compression either Tubigrip or  compression sock. We will see patient back for reevaluation in 1 week here in the clinic. If anything worsens or changes patient will contact our office for additional recommendations. Electronic Signature(s) Signed: 06/26/2022 5:04:23 PM By: Deon Pilling RN, BSN Signed: 06/26/2022 6:05:31 PM By: Worthy Keeler PA-C Previous Signature: 06/26/2022 11:14:23 AM Version By: Worthy Keeler PA-C Entered By: Deon Pilling on 06/26/2022 13:00:07 -------------------------------------------------------------------------------- SuperBill Details Patient Name: Date of Service: Jamie [name], Mattie Marlin. 06/26/2022 Medical Record Number: [id number] Patient Account Number: [id number] Date of Birth/Sex: Treating RN: [birth year] (82 y.o. Erlene Quan Primary Care Provider: Ozella Rocks Other Clinician: Referring Provider: Treating Provider/Extender: Alfredo Martinez, [name] in Treatment: 5 Diagnosis Coding ICD-10 Codes Code Description [phone] Chronic venous hypertension (idiopathic) with ulcer and inflammation of right lower extremity 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d L97.822 Non-pressure chronic ulcer of other part of left lower leg with fat layer exposed I25.10 Atherosclerotic heart disease of native coronary artery without angina pectoris NASIRA, EVARTS ([id number]) [id number] Page 8 of 8 Facility Procedures : CPT4 Code: [id number] Description: 99213 - WOUND CARE VISIT-LEV 3 EST PT Modifier: Quantity: 1 Physician Procedures : CPT4 Code Description Modifier [id number] - WC PHYS LEVEL 3 - EST PT ICD-10 Diagnosis Description I87.331 Chronic venous hypertension (idiopathic) with ulcer and inflammation of right lower extremity L97.812 Non-pressure chronic ulcer of other part  of right lower leg with fat layer exposed L97.822 Non-pressure chronic ulcer of other part of left  lower leg with fat layer exposed I25.10 Atherosclerotic heart disease of native coronary artery without angina pectoris Quantity: 1 Electronic Signature(s) Signed: 06/26/2022 11:14:36 AM By: Worthy Keeler PA-C Entered By: Worthy Keeler on 06/26/2022 11:14:35

## 2022-07-03 ENCOUNTER — Encounter (HOSPITAL_BASED_OUTPATIENT_CLINIC_OR_DEPARTMENT_OTHER): Payer: Medicare HMO | Admitting: Physician Assistant

## 2022-07-03 DIAGNOSIS — I87331 Chronic venous hypertension (idiopathic) with ulcer and inflammation of right lower extremity: Secondary | ICD-10-CM | POA: Diagnosis not present

## 2022-07-03 NOTE — Progress Notes (Addendum)
Jamie [name] ([id number]) [id number] Page 1 of 8 Visit Report for 07/03/2022 Chief Complaint Document Details Patient Name: Date of Service: Jamie [name], [place]. 07/03/2022 10:15 A M Medical Record Number: [id number] Patient Account Number: [id number] Date of Birth/Sex: Treating RN: [birth year] (82 y.o. F) Primary Care Provider: Ozella [name] Other Clinician: Referring Provider: Treating Provider/Extender: Jamie [name], Jamie [name] in Treatment: 6 Information Obtained from: Patient Chief Complaint Bilateral LE Ulcers Electronic Signature(s) Signed: 07/03/2022 10:04:53 AM By: Jamie Keeler PA-C Entered By: Jamie [name] on 07/03/2022 10:04:53 -------------------------------------------------------------------------------- HPI Details Patient Name: Date of Service: Jamie [name], Jamie [name] W. 07/03/2022 10:15 A M Medical Record Number: [id number] Patient Account Number: [id number] Date of Birth/Sex: Treating RN: February 04, 1941 (82 y.o. F) Primary Care Provider: Ozella [name] Other Clinician: Referring Provider: Treating Provider/Extender: Jamie [name], Jamie [name] in Treatment: 6 History of Present Illness HPI Description: ADMISSION 06/30/2020 Jamie [name] is an 82 year old woman who lives in [place]. She is here with her niece for review of wounds on the left medial lower leg and ankle. These have apparently been present for over a year and she followed with Dr. Juliann Pulse at the [facility] in [place] for quite a period of time although it looks as though there was a initial consult wound from Dr. Ladona Horns on February 18 presumably there was therefore hiatus. At that point the wounds were described as being there for 3 months. She also tells me she was at the wound care center in [place] for a period of time with this. 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 She had venous studies that were negative for DVT ABIs on the right were 1.01 on the left 1.06. She has . had previous applications of puraply, compression which she does not tolerate very well. She has had several rounds of oral antibiotic therapy. She complains of unrelenting pain and she is seeing Dr. Crist Fat V of pain management apparently was on oxycodone but that did not help. She also had a skin biopsy done by Dr. Juliann Pulse although we do not have that result. She does not appear to have an arterial issue. I am not completely clear what she has been putting on the wounds lately. 3/31; this patient has a particularly nasty set of wounds on the left medial ankle in the middle of what looks to be hemosiderin deposition secondary to chronic venous insufficiency. She has a lot of pain followed by pain management. Dr. Ladona Horns apparently did a biopsy of something on the left leg last fall what I would like to get this result. She has a history of MRSA treatment. I do not believe she had reflux studies but she did have DVT rule out studies. Previous ABIs have not suggested arterial insufficiency 4/7; difficult wounds on the left medial ankle probably chronic venous insufficiency. With considerable effort on behalf of our case manager we were able to finally to speak to somebody at the hospital in [place] who indicated that no biopsy of this area have been done even though the patient describes this in some detail and is even able to point out where she thinks the biopsy was done. We have been using Sorbact. The PCR culture I did showed polymicrobial identification with Pseudomonas, staph aureus, Peptostreptococcus. All of this and low titers. Resistance genes identified were MRSA, staph virulence gene and tetracycline. We are going to send this to [facility] for a topical antibiotic which  is something that we have had good success with recently in large venous ulcers with a lot of  purulent drainage. There would not be an easy oral alternative here possibly line escalated and ciprofloxacin if we need to use systemic antibiotic 4/15; difficult area on the left medial ankle. Most likely chronic venous insufficiency. I think she will probably need venous reflux study I think she had DVT rule outs but not venous reflux studies. We have not yet obtained the topical antibiotics. She has home health changing the dressing we have been using Sorbact for adherent fibrinous debris on the surface. Very difficult to remove 4/22; patient presents for 1 week follow-up. She has been using sore back under compression wraps and these are changed 3 times a week with home health. She also had Keystone antibiotics sent to her house and brought them in today. She has no complaints or issues today. 5/2; patient is here for follow-up. She has been using Sorbact under compression. Very painful wound. She has been using Keystone antibiotics. Not much improvement although the more medial part of the wound has cleaned up nicely and the larger part of the wound about 50% slough covered. Part of the issue here is that she had stays at both [facility] wound care center, [facility] wound care center and now [place]. Not sure if she has had venous reflux studies. As far as we are able to tell she did not have a biopsy. My notes state that she did not have venous reflux studies just DVT rule outs. 5/16; patient goes for venous reflux studies this afternoon. She says that wound was biopsied which sounds like punch biopsies by Dr. Tye [place] we do not have these results. We are using Sorbact. Very difficult wound to debride 5/23; patient presents for 1 week follow-up. She reports tolerating the wraps well with sorbact underneath. She had ABIs and venous reflux studies done. She Jamie [name] ([id number]) [id number] Page 2 of 8 has no issues or complaints today. She denies acute signs of  infection. 6/6; I have reviewed the patient's vascular studies. Wounds are on the left medial and posterior calf. She had significant reflux in the greater saphenous vein in the in the mid thigh, distal thigh knee and the small saphenous vein in the popliteal fossa. The vein diameters do not look too impressive though. She is going to see the vascular surgeon on Wednesday. She also had venous reflux in the right common femoral vein. She did not have any evidence of a DVT or SVT I am wondering whether there is an ablation procedure that would benefit her in the greater saphenous vein on the left. . She tells [place] that home health put the dressing on too tight and she took off 1 layer. The swelling in her left leg is a little worse as a result of this. Not much change in the wound measurements so the surface of the wound looks better Her arterial studies showed an ABI on the right of 1.14 with a triphasic waveform and a great toe pressure of 0.89. On the left her ABIs were noncompressible at 1.34 but with triphasic waveforms and a TBI of 0.97. Her greater toe pressure was 122. There was no evidence of significant bilateral arterial disease 6/20 patient went to see Dr. Doren Custard. He did not think she had significant arterial disease. In terms of her venous duplex on the right side there was no evidence of a DVT or SVT there was deep venous reflux involving  the common femoral vein no superficial vein reflux on the left side there was no DVT or SVT there was no deep vein graft reflux there was some reflux in the greater saphenous vein from the mid thigh to the knee but the vein here was not dilated. He thought these were venous wounds he prescribed a compression pump but I am not sure who we ordered it from The patient has been approved for Apligraf. Still using silver collagen this week 7/5; Apligraf #1 7/19 Apligraf #2. Decent improvement in the condition of the wound bed. Epithelialization distal 8/2  Apligraf #3. No issues or complaints. Denies signs of infection. 8/17 Apligraf #4. No issues or concerns. Some complaints of pruritus and the rash. Our intake nurse brought up the fact that she had previously indicated possible cotton layer sensitivity we will therefore use kerlix in the bottom layer of the compression 8/30; the patient comes in with the area on her left medial leg just about healed. There is a superficial area more towards the tibia and a smaller open area distally everything else is epithelialized. I do not think she requires another Apligraf 9/13; left medial leg is healed. 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 Her edema control is good Readmisstion: 05-22-2022 upon evaluation today patient presents for initial inspection here in our clinic concerning issues that she has been having with a wound of the right lateral lower extremity. This is an area of a previous skin graft she tells me. With that being said she unfortunately had a scrape on this that occurred around 10 January. Since that time she has noted that this has just continued to get bigger and bigger in her niece who is present with her today actually states that 2 [name] ago when she saw that it was significantly smaller than what she sees currently. Obviously this is of utmost concern as we do not want this to continue to get larger when arrested and get moving in the right direction. Fortunately there does not appear to be any signs of systemic infection though locally I think we probably do have some infection present. I would obtain a culture to see what we have going on here and then we will subsequently see where things go going forward. Fortunately I think that she is in the right place at this point being at the wound center we can definitely do something to try to get this moving in the right direction. Patient does have a history of chronic venous  insufficiency as well as coronary artery disease. In the past she has done well with compression wraps on the start with a 3 layer wrap I think she is probably can end up going to a 4-layer at some point but we will see how things do over the next week. She has been using wound cleanser along with she tells me a zinc and topical but again I am not sure exactly what that was. 05-29-2022 upon evaluation today patient appears to be doing well currently in regard to her wound. This actually is showing signs of improvement I am happy in that regard unfortunately her left leg has an area on the shin that has opened. This is the region where one of the areas at least we have previously taken care of. Nonetheless this is small hoping we get it under control before things worsen significantly. 06-05-2022 upon evaluation today patient appears to be doing well currently  in regard to her wounds. The actually seem to be fairly clean she is having still quite a bit of problem with pain at this point she would like to not have debridement today for all possible. With that being said I do believe that we are making some good progress I think the [facility] is doing a decent job here. 3/6; patient has had 3/6; patient with known severe chronic venous insufficiency. She apparently had a traumatic wound at home and has a reasonably substantial wound on the right lateral lower leg and a smaller one on the left anterior lower leg as well. We have been using Hydrofera Blue and Unna boots 3/13; patient presents for follow-up. We have been using Hydrofera Blue under Coflex to the legs bilaterally. She has no issues or complaints today. 06-26-2022 upon evaluation today patient appears to be doing well currently in regard to her wound. This is measuring a little bit larger however. Fortunately I do not see any signs of infection this is on the right side. Fortunately the left side is actually completely healed which is great  news. 07-03-2022 patient's wound unfortunately is continuing to show signs of being worse. Her compression which was the Tubigrip that I thought should be able to pull up actually slipped down and she was not able to pull that back up. With that being said that means that unfortunately her wound has continued to deteriorate since I last saw her. This is definitely not the direction that we are looking for. I discussed with her that I do believe we need to go ahead and see about getting her started on antibiotics and I subsequently would also like to go ahead and see about getting things moving forward with regard to a wound culture and making a change up her dressings as well but this can be changed more frequently than just once a week. Electronic Signature(s) Signed: 07/03/2022 10:55:00 AM By: Jamie Keeler PA-C Entered By: Jamie [name] on 07/03/2022 10:54:59 -------------------------------------------------------------------------------- Physical Exam Details Patient Name: Date of Service: Jamie Sias. 07/03/2022 10:15 A M Medical Record Number: [id number] Patient Account Number: [id number] Date of Birth/Sex: Treating RN: December 17, 1940 (82 y.o. F) Primary Care Provider: Ozella [name] Other Clinician: Referring Provider: Treating Provider/Extender: Jamie [name], Jamie [name] in Treatment: 6 Constitutional Well-nourished and well-hydrated in no acute distress. Respiratory normal breathing without difficulty. DEZAREA, HEADRICK ([id number]) [id number] Page 3 of 8 Psychiatric this patient is able to make decisions and demonstrates good insight into disease process. Alert and Oriented x 3. pleasant and cooperative. Notes Upon inspection patient's wound bed actually showed signs of poor granulation and epithelization at this point. I do not believe the gentamicin has been doing much. I did actually go ahead and obtain a  wound culture today I think that there is a chance at least this could be Pseudomonas based on the dressing and the drainage that we were seeing but I want to see how things look on the actual culture in the interim I am probably going to put her on Cipro. Electronic Signature(s) Signed: 07/03/2022 10:55:23 AM By: Jamie Keeler PA-C Entered By: Jamie [name] on 07/03/2022 10:55:22 -------------------------------------------------------------------------------- Physician Orders Details Patient Name: Date of Service: Jamie [name], Jamie [name] W. 07/03/2022 10:15 A M Medical Record Number: [id number] Patient Account Number: [id number] Date of Birth/Sex: Treating RN: 07/17/1940 (82 y.o. 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 Margarita Grizzle Wednesday Room 8 07/10/2022 1015 room 8 (already has appt.) Return A ppointment in 2 [name]. - w/ Margarita Grizzle ST and Bobbi Rm # 8 Wednesday 07/17/22 @ 10:15 one Return A Return appointment in 3 [name]. - w/ Victor Langenbach ST and Bobbi Rm # 8 Wednesday 07/24/22 @ 10:15 one Anesthetic Wound #5 Right,Lateral Lower Leg (In clinic) Topical Lidocaine 4% applied to wound bed Bathing/ Shower/ Hygiene May shower with protection but do not get wound dressing(s) wet. Protect dressing(s) with water repellant cover (for example, large plastic bag) or a cast  cover and may then take shower. - may purchase cast protector from [facility], [facility] or [employer] Edema Control - Lymphedema / SCD / Other Right Lower Extremity Elevate legs to the level of the heart or above for 30 minutes daily and/or when sitting for 3-4 times a day throughout the day. Avoid standing for long periods of time. Exercise regularly Moisturize legs daily. - Apply to left leg every night before bedtime. Compression stocking or Garment 20-30 mm/Hg pressure to: - Apply to left leg every morning and remove every night. Home Health Admit to [facility] for skilled nursing wound care. May utilize formulary equivalent dressing for wound treatment orders unless otherwise specified. - T be changed Monday and Friday by [place], Wednesday by [place] o Wound Treatment Wound #5 - Lower Leg Wound Laterality: Right, Lateral Cleanser: Soap and Water 3 x Per Week/30 Days Discharge Instructions: May shower and wash wound with dial antibacterial soap and water prior to dressing change. Peri-Wound Care: Sween Lotion (Moisturizing lotion) 3 x Per Week/30 Days Discharge Instructions: Apply moisturizing lotion as directed Prim Dressing: Hydrofera Blue Ready Transfer Foam, 4x5 (in/in) (DME) (Generic) 3 x Per Week/30 Days ary Discharge Instructions: Apply to wound bed as instructed Secondary Dressing: Zetuvit Plus 4x8 in (DME) (Generic) 3 x Per Week/30 Days Discharge Instructions: Apply over primary dressing as directed. Secured With: [employer], 4.5x3.1 (in/yd) (DME) (Generic) 3 x Per Week/30 Days Discharge Instructions: Secure with Kerlix as directed. Jamie [name], Jamie [name] ([id number]) [id number] Page 4 of 8 Secured With: 33M Medipore H Soft Cloth Surgical T ape, 4 x 10 (in/yd) (DME) (Generic) 3 x Per Week/30 Days Discharge Instructions: Secure with tape as directed. Compression Wrap: Tubigrip Size E (Generic) 3 x Per Week/30 Days Discharge  Instructions: applied one layer to right leg. Leave in place all week. Compression Stockings: Circaid Juxta Lite Compression Wrap (DME) Right Leg Compression Amount: 20-30 mmHG Discharge Instructions: Apply Circaid Juxta Lite Compression Wrap daily as instructed. Apply first thing in the morning, remove at night before bed. Laboratory naerobe culture (MICRO) - Right Leg Bacteria identified in Unspecified specimen by A LOINC Code: [id number] Convenience Name: Anaerobic culture Patient Medications llergies: gabapentin, tizanidine, amlodipine, niacin, aspirin, lisinopril, valsartan A Notifications Medication Indication Start End 07/03/2022 Cipro DOSE 1 - oral 500 mg tablet - 1 tablet oral twice a day x 14 days Electronic Signature(s) Signed: 07/03/2022 10:57:03 AM By: Jamie Keeler PA-C Entered By: Jamie [name] on 07/03/2022  10:57:02 -------------------------------------------------------------------------------- Problem List Details Patient Name: Date of Service: Jamie [name], [place]. 07/03/2022 10:15 A M Medical Record Number: [id number] Patient Account Number: [id number] Date of Birth/Sex: Treating RN: 19-Aug-1940 (82 y.o. F) Primary Care Provider: Ozella [name] Other Clinician: Referring Provider: Treating Provider/Extender: Jamie [name], Jamie [name] in Treatment: 6 Active Problems ICD-10 Encounter Code Description Active Date MDM Diagnosis I87.331 Chronic venous hypertension (idiopathic) with ulcer and inflammation of right 05/22/2022 No Yes lower extremity L97.812 Non-pressure chronic ulcer of other part of right lower leg with fat layer 05/22/2022 No Yes exposed L97.822 Non-pressure chronic ulcer of other part of left lower leg with fat layer exposed2/21/2024 No Yes I25.10 Atherosclerotic heart disease of native coronary artery without angina pectoris 05/22/2022 No Yes Inactive Problems Resolved Problems Jamie [name], Jamie [name] ([id number])  [id number] Page 5 of 8 Electronic Signature(s) Signed: 07/03/2022 10:04:47 AM By: Jamie Keeler PA-C Entered By: Jamie [name] on 07/03/2022 10:04:47 -------------------------------------------------------------------------------- Progress Note Details Patient Name: Date of Service: Jamie [name], [place] W. 07/03/2022 10:15 A M Medical Record Number: [id number] Patient Account Number: [id number] Date of Birth/Sex: Treating RN: 1940/04/19 (82 y.o. F) Primary Care Provider: Ozella [name] Other Clinician: Referring Provider: Treating Provider/Extender: Jamie [name], Jamie [name] in Treatment: 6 Subjective Chief Complaint Information obtained from Patient Bilateral LE Ulcers History of Present Illness (HPI) ADMISSION 06/30/2020 Mrs. Dunckel is an 82 year old woman who lives in [place]. She is here with her niece for review of wounds on the left medial lower leg and ankle. These have apparently been present for over a year and she followed with Dr. Juliann Pulse at the [facility] in [place] for quite a period of time although it looks as though there was a initial consult wound from Dr. Ladona Horns on February 18 presumably there was therefore hiatus. At that point the wounds were described as being there for 3 months. She also tells me she was at the wound care center in [place] for a period of time with this. 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 She had venous studies that were negative for DVT ABIs on the right were 1.01 on the left 1.06. She has . had previous applications of puraply, compression which she does not tolerate very well. She has had several rounds of oral antibiotic therapy. She complains of unrelenting pain and she is seeing Dr. Crist Fat V of pain management apparently was on oxycodone but that did not help. She also had a skin biopsy done by Dr. Juliann Pulse although we  do not have that result. She does not appear to have an arterial issue. I am not completely clear what she has been putting on the wounds lately. 3/31; this patient has a particularly nasty set of wounds on the left medial ankle in the middle of what looks to be hemosiderin deposition secondary to chronic venous insufficiency. She has a lot of pain followed by pain management. Dr. Ladona Horns apparently did a biopsy of something on the left leg last fall what I would like to get this result. She has a history of MRSA treatment. I do not believe she had reflux studies but she did have DVT rule out studies. Previous ABIs have not suggested arterial insufficiency 4/7; difficult wounds on the left medial ankle probably chronic venous insufficiency. With considerable effort on behalf of our case manager we were able to finally to speak to somebody at the hospital in [place] who indicated that  no biopsy of this area have been done even though the patient describes this in some detail and is even able to point out where she thinks the biopsy was done. We have been using Sorbact. The PCR culture I did showed polymicrobial identification with Pseudomonas, staph aureus, Peptostreptococcus. All of this and low titers. Resistance genes identified were MRSA, staph virulence gene and tetracycline. We are going to send this to [facility] for a topical antibiotic which is something that we have had good success with recently in large venous ulcers with a lot of purulent drainage. There would not be an easy oral alternative here possibly line escalated and ciprofloxacin if we need to use systemic antibiotic 4/15; difficult area on the left medial ankle. Most likely chronic venous insufficiency. I think she will probably need venous reflux study I think she had DVT rule outs but not venous reflux studies. We have not yet obtained the topical antibiotics. She has home health changing the dressing we have been using Sorbact for  adherent fibrinous debris on the surface. Very difficult to remove 4/22; patient presents for 1 week follow-up. She has been using sore back under compression wraps and these are changed 3 times a week with home health. She also had [facility] antibiotics sent to her house and brought them in today. She has no complaints or issues today. 5/2; patient is here for follow-up. She has been using Sorbact under compression. Very painful wound. She has been using Keystone antibiotics. Not much improvement although the more medial part of the wound has cleaned up nicely and the larger part of the wound about 50% slough covered. Part of the issue here is that she had stays at both [facility] wound care center, [facility] wound care center and now [place]. Not sure if she has had venous reflux studies. As far as we are able to tell she did not have a biopsy. My notes state that she did not have venous reflux studies just DVT rule outs. 5/16; patient goes for venous reflux studies this afternoon. She says that wound was biopsied which sounds like punch biopsies by Dr. Tye [place] we do not have these results. We are using Sorbact. Very difficult wound to debride 5/23; patient presents for 1 week follow-up. She reports tolerating the wraps well with sorbact underneath. She had ABIs and venous reflux studies done. She has no issues or complaints today. She denies acute signs of infection. 6/6; I have reviewed the patient's vascular studies. Wounds are on the left medial and posterior calf. She had significant reflux in the greater saphenous vein in the in the mid thigh, distal thigh knee and the small saphenous vein in the popliteal fossa. The vein diameters do not look too impressive though. She is going to see the vascular surgeon on Wednesday. She also had venous reflux in the right common femoral vein. She did not have any evidence of a DVT or SVT I am wondering whether there is an ablation procedure that would benefit her in  the greater saphenous vein on the left. . She tells [place] that home health put the dressing on too tight and she took off 1 layer. The swelling in her left leg is a little worse as a result of this. Not much change in the wound measurements so the surface of the wound looks better Her arterial studies showed an ABI on the right of 1.14 with a triphasic waveform and a great toe pressure of 0.89. On the left her ABIs  were noncompressible at 1.34 but with triphasic waveforms and a TBI of 0.97. Her greater toe pressure was 122. There was no evidence of significant bilateral arterial disease 6/20 patient went to see Dr. Doren Custard. He did not think she had significant arterial disease. In terms of her venous duplex on the right side there was no evidence of a DVT or SVT there was deep venous reflux involving the common femoral vein no superficial vein reflux on the left side there was no DVT or SVT there was no deep vein graft reflux there was some reflux in the greater saphenous vein from the mid thigh to the knee but the vein here was not dilated. He thought these were venous wounds he prescribed a compression pump but I am not sure who we ordered it from The patient has been approved for Apligraf. Still using silver collagen this week 7/5; Apligraf #1 7/19 Apligraf #2. Decent improvement in the condition of the wound bed. Epithelialization distal 8/2 Apligraf #3. No issues or complaints. Denies signs of infection. 8/17 Apligraf #4. No issues or concerns. Some complaints of pruritus and the rash. Our intake nurse brought up the fact that she had previously indicated Jamie [name], Jamie [name] ([id number]) [id number] Page 6 of 8 possible cotton layer sensitivity we will therefore use kerlix in the bottom layer of the compression 8/30; the patient comes in with the area on her left medial leg just about healed. There is a superficial area more towards the tibia and a smaller open  area distally everything else is epithelialized. I do not think she requires another Apligraf 9/13; left medial leg is healed. 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 Her edema control is good Readmisstion: 05-22-2022 upon evaluation today patient presents for initial inspection here in our clinic concerning issues that she has been having with a wound of the right lateral lower extremity. This is an area of a previous skin graft she tells me. With that being said she unfortunately had a scrape on this that occurred around 10 January. Since that time she has noted that this has just continued to get bigger and bigger in her niece who is present with her today actually states that 2 [name] ago when she saw that it was significantly smaller than what she sees currently. Obviously this is of utmost concern as we do not want this to continue to get larger when arrested and get moving in the right direction. Fortunately there does not appear to be any signs of systemic infection though locally I think we probably do have some infection present. I would obtain a culture to see what we have going on here and then we will subsequently see where things go going forward. Fortunately I think that she is in the right place at this point being at the wound center we can definitely do something to try to get this moving in the right direction. Patient does have a history of chronic venous insufficiency as well as coronary artery disease. In the past she has done well with compression wraps on the start with a 3 layer wrap I think she is probably can end up going to a 4-layer at some point but we will see how things do over the next week. She has been using wound cleanser along with she tells me a zinc and topical but again I am not sure exactly what that was. 05-29-2022 upon evaluation today patient appears to  be doing well currently in regard to her  wound. This actually is showing signs of improvement I am happy in that regard unfortunately her left leg has an area on the shin that has opened. This is the region where one of the areas at least we have previously taken care of. Nonetheless this is small hoping we get it under control before things worsen significantly. 06-05-2022 upon evaluation today patient appears to be doing well currently in regard to her wounds. The actually seem to be fairly clean she is having still quite a bit of problem with pain at this point she would like to not have debridement today for all possible. With that being said I do believe that we are making some good progress I think the [facility] is doing a decent job here. 3/6; patient has had 3/6; patient with known severe chronic venous insufficiency. She apparently had a traumatic wound at home and has a reasonably substantial wound on the right lateral lower leg and a smaller one on the left anterior lower leg as well. We have been using Hydrofera Blue and Unna boots 3/13; patient presents for follow-up. We have been using Hydrofera Blue under Coflex to the legs bilaterally. She has no issues or complaints today. 06-26-2022 upon evaluation today patient appears to be doing well currently in regard to her wound. This is measuring a little bit larger however. Fortunately I do not see any signs of infection this is on the right side. Fortunately the left side is actually completely healed which is great news. 07-03-2022 patient's wound unfortunately is continuing to show signs of being worse. Her compression which was the Tubigrip that I thought should be able to pull up actually slipped down and she was not able to pull that back up. With that being said that means that unfortunately her wound has continued to deteriorate since I last saw her. This is definitely not the direction that we are looking for. I discussed with her that I do believe we need to go ahead and  see about getting her started on antibiotics and I subsequently would also like to go ahead and see about getting things moving forward with regard to a wound culture and making a change up her dressings as well but this can be changed more frequently than just once a week. Objective Constitutional Well-nourished and well-hydrated in no acute distress. Vitals Time Taken: 10:04 AM, Height: 62 in, Weight: 171 lbs, BMI: 31.3, Temperature: 98.7 F, Pulse: 69 bpm, Respiratory Rate: 17 breaths/min, Blood Pressure: 109/67 mmHg. Respiratory normal breathing without difficulty. Psychiatric this patient is able to make decisions and demonstrates good insight into disease process. Alert and Oriented x 3. pleasant and cooperative. General Notes: Upon inspection patient's wound bed actually showed signs of poor granulation and epithelization at this point. I do not believe the gentamicin has been doing much. I did actually go ahead and obtain a wound culture today I think that there is a chance at least this could be Pseudomonas based on the dressing and the drainage that we were seeing but I want to see how things look on the actual culture in the interim I am probably going to put her on Cipro. Integumentary (Hair, Skin) Wound #5 status is Open. Original cause of wound was Trauma. The date acquired was: 04/17/2022. The wound has been in treatment 6 [name]. The wound is located on the Right,Lateral Lower Leg. The wound measures 12cm length x 5.4cm width x 0.2cm  depth; 50.894cm^2 area and 10.179cm^3 volume. There is Fat Layer (Subcutaneous Tissue) exposed. There is no tunneling or undermining noted. There is a medium amount of serosanguineous drainage noted. The wound margin is distinct with the outline attached to the wound base. There is large (67-100%) red, friable granulation within the wound bed. There is a small (1-33%) amount of necrotic tissue within the wound bed including Adherent Slough. The  periwound skin appearance exhibited: Scarring, Hemosiderin Staining. The periwound skin appearance did not exhibit: Callus, Crepitus, Excoriation, Induration, Rash, Dry/Scaly, Maceration, Atrophie Blanche, Cyanosis, Ecchymosis, Mottled, Pallor, Rubor, Erythema. The periwound has tenderness on palpation. Wound #6 status is Open. Original cause of wound was Shear/Friction. The date acquired was: 05/22/2022. The wound has been in treatment 5 [name]. The wound is located on the Left,Anterior Lower Leg. The wound measures 0cm length x 0cm width x 0cm depth; 0cm^2 area and 0cm^3 volume. There is Fat Layer (Subcutaneous Tissue) exposed. There is a medium amount of serosanguineous drainage noted. The wound margin is distinct with the outline attached to the wound base. There is small (1-33%) pink granulation within the wound bed. There is a medium (34-66%) amount of necrotic tissue within the wound bed. The periwound skin appearance did not exhibit: Callus, Crepitus, Excoriation, Induration, Rash, Scarring, Dry/Scaly, Maceration, Atrophie Blanche, Cyanosis, Ecchymosis, Hemosiderin Staining, Mottled, Pallor, Rubor, Erythema. 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 Margarita Grizzle Wednesday Room 8 07/10/2022 1015 room 8 (already has appt.) Return Appointment in 2 [name]. - w/ Margarita Grizzle [facility] Rm # 8 Wednesday 07/17/22 @ 10:15 one Return appointment in 3 [name]. - w/ Guilherme Schwenke [facility] # 8 Wednesday 07/24/22 @ 10:15 one Anesthetic: Wound #5  Right,Lateral Lower Leg: (In clinic) Topical Lidocaine 4% applied to wound bed Bathing/ Shower/ Hygiene: May shower with protection but do not get wound dressing(s) wet. Protect dressing(s) with water repellant cover (for example, large plastic bag) or a cast cover and may then take shower. - may purchase cast protector from [facility], [facility] or [employer] Edema Control - Lymphedema / SCD / Other: Elevate legs to the level of the heart or above for 30 minutes daily and/or when sitting for 3-4 times a day throughout the day. Avoid standing for long periods of time. Exercise regularly Moisturize legs daily. - Apply to left leg every night before bedtime. Compression stocking or Garment 20-30 mm/Hg pressure to: - Apply to left leg every morning and remove every night. Home Health: Admit to Home Health for skilled nursing wound care. May utilize formulary equivalent dressing for wound treatment orders unless otherwise specified. - T be o changed Monday and Friday by [place], Wednesday by [facility] ordered were: Anaerobic culture - Right Leg The following medication(s) was prescribed: Cipro oral 500 mg tablet 1 1 tablet oral twice a day x 14 days starting 07/03/2022 WOUND #5: - Lower Leg Wound Laterality: Right, Lateral Cleanser: Soap and Water 3 x Per Week/30 Days Discharge Instructions: May shower and wash wound with dial antibacterial soap and water prior to dressing change. Peri-Wound Care: Sween Lotion (Moisturizing lotion) 3 x Per Week/30  Days Discharge Instructions: Apply moisturizing lotion as directed Prim Dressing: Hydrofera Blue Ready Transfer Foam, 4x5 (in/in) (DME) (Generic) 3 x Per Week/30 Days ary Discharge Instructions: Apply to wound bed as instructed Secondary Dressing: Zetuvit Plus 4x8 in (DME) (Generic) 3 x Per Week/30 Days Discharge Instructions: Apply over primary dressing as directed. Secured With: [employer], 4.5x3.1 (in/yd) (DME) (Generic) 3 x  Per Week/30 Days Discharge Instructions: Secure with Kerlix as directed. Secured With: 53M Medipore H Soft Cloth Surgical T ape, 4 x 10 (in/yd) (DME) (Generic) 3 x Per Week/30 Days Discharge Instructions: Secure with tape as directed. Com pression Wrap: Tubigrip Size E (Generic) 3 x Per Week/30 Days Discharge Instructions: applied one layer to right leg. Leave in place all week. Com pression Stockings: Circaid Juxta Lite Compression Wrap (DME) Compression Amount: 20-30 mmHg (right) Discharge Instructions: Apply Circaid Juxta Lite Compression Wrap daily as instructed. Apply first thing in the morning, remove at night before bed. 1. I will go ahead and send in a prescription for Cipro for the patient I think this is probably to be the best way to go and she voiced understanding and agreement with the plan. 2. I am also going to recommend that we have her continue with compression although I would recommend going up to a size on the Tubigrip to size easier this will be a little bit more loose. I am also can recommend that the patient should make sure that she keeps this pulled up and will get a see about getting home health for her so they can help with dressing changes as well. I think this could be beneficial. We will work on that currently. 3. I would also recommend she should continue to keep her legs elevated is much as possible. I think this is still going to be of utmost importance and I discussed that with her today as soon as we get her back in compression wraps we will do so but right now I think we need to primarily make sure that this can be changed more frequently. We will see patient back for reevaluation in 1 week here in the clinic. If anything worsens or changes patient will contact our office for additional recommendations. Electronic Signature(s) Signed: 07/03/2022 10:57:24 AM By: Jamie Keeler PA-C Entered By: Jamie [name] on 07/03/2022  10:57:24 -------------------------------------------------------------------------------- SuperBill Details Patient Name: Date of Service: Jamie [name], Jamie Marlin. 07/03/2022 Medical Record Number: [id number] Patient Account Number: [id number] Jamie [name], Jamie [name] ([id number]) [id number] Page 8 of 8 Date of Birth/Sex: Treating RN: 02-28-1941 (82 y.o. F) Primary Care Provider: Ozella [name] Other Clinician: Referring Provider: Treating Provider/Extender: Jamie [name], Jamie [name] in Treatment: 6 Diagnosis Coding ICD-10 Codes Code Description [phone] Chronic venous hypertension (idiopathic) with ulcer and inflammation of right lower extremity 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d L97.822 Non-pressure chronic ulcer of other part of left lower leg with fat layer exposed I25.10 Atherosclerotic heart disease of native coronary artery without angina pectoris Facility Procedures : CPT4 Code: [id number] Description: 99214 - WOUND CARE VISIT-LEV 4 EST PT Modifier: Quantity: 1 Physician Procedures : CPT4 Code Description Modifier [id number] - WC PHYS LEVEL 4 - EST PT ICD-10 Diagnosis Description I87.331 Chronic venous hypertension (idiopathic) with ulcer and inflammation of right lower extremity L97.812 Non-pressure chronic ulcer of other part  of right lower leg with fat layer exposed L97.822 Non-pressure chronic ulcer of other part of left lower leg with  fat layer exposed I25.10 Atherosclerotic heart disease of native coronary artery without angina pectoris Quantity: 1 Electronic Signature(s) Signed: 07/03/2022 3:38:10 PM By: Jamie Keeler PA-C Signed: 07/10/2022 4:26:45 PM By: Rhae Hammock RN Previous Signature: 07/03/2022 10:57:35 AM Version By: Jamie Keeler PA-C Entered By: Rhae Hammock on 07/03/2022 11:38:16

## 2022-07-06 LAB — AEROBIC CULTURE W GRAM STAIN (SUPERFICIAL SPECIMEN)

## 2022-07-10 ENCOUNTER — Encounter (HOSPITAL_BASED_OUTPATIENT_CLINIC_OR_DEPARTMENT_OTHER): Payer: Medicare HMO | Attending: Physician Assistant | Admitting: Physician Assistant

## 2022-07-10 DIAGNOSIS — L97822 Non-pressure chronic ulcer of other part of left lower leg with fat layer exposed: Secondary | ICD-10-CM | POA: Diagnosis not present

## 2022-07-10 DIAGNOSIS — I2510 Atherosclerotic heart disease of native coronary artery without angina pectoris: Secondary | ICD-10-CM | POA: Insufficient documentation

## 2022-07-10 DIAGNOSIS — G629 Polyneuropathy, unspecified: Secondary | ICD-10-CM | POA: Insufficient documentation

## 2022-07-10 DIAGNOSIS — L97812 Non-pressure chronic ulcer of other part of right lower leg with fat layer exposed: Secondary | ICD-10-CM | POA: Insufficient documentation

## 2022-07-10 DIAGNOSIS — I872 Venous insufficiency (chronic) (peripheral): Secondary | ICD-10-CM | POA: Insufficient documentation

## 2022-07-10 DIAGNOSIS — M1990 Unspecified osteoarthritis, unspecified site: Secondary | ICD-10-CM | POA: Insufficient documentation

## 2022-07-10 NOTE — Progress Notes (Addendum)
Jamie [name], Jamie [name] ([id number]) [id number] Page 1 of 9 Visit Report for 07/10/2022 Chief Complaint Document Details Patient Name: Date of Service: Jamie [name], [place]. 07/10/2022 10:15 A M Medical Record Number: [id number] Patient Account Number: [id number] Date of Birth/Sex: Treating RN: 1940-06-11 (82 y.o. F) Primary Care Provider: Ozella [name] Other Clinician: Referring Provider: Treating Provider/Extender: Jamie [name], Jamie [name] in Treatment: 7 Information Obtained from: Patient Chief Complaint Bilateral LE Ulcers Electronic Signature(s) Signed: 07/10/2022 10:15:40 AM By: Jamie Keeler PA-C Entered By: Jamie [name] on 07/10/2022 10:15:40 -------------------------------------------------------------------------------- Debridement Details Patient Name: Date of Service: Jamie [name], Jamie Dark W. 07/10/2022 10:15 A M Medical Record Number: [id number] Patient Account Number: [id number] Date of Birth/Sex: Treating RN: 31-Aug-1940 (82 y.o. Jamie [name], Jamie [name] Primary Care Provider: Ozella [name] Other Clinician: Referring Provider: Treating Provider/Extender: Jamie [name], Jamie [name] in Treatment: 7 Debridement Performed for Assessment: Wound #5 Right,Lateral Lower Leg Performed By: Physician Jamie Keeler, PA Debridement Type: Debridement Severity of Tissue Pre Debridement: Fat layer exposed Level of Consciousness (Pre-procedure): Awake and Alert Pre-procedure Verification/Time Out Yes - 10:45 Taken: Start Time: 10:46 Pain Control: Lidocaine 4% T opical Solution T Area Debrided (L x W): otal 13 (cm) x 3 (cm) = 39 (cm) Tissue and other material debrided: Viable, Non-Viable, Eschar, Slough, Subcutaneous, Skin: Dermis , Skin: Epidermis, Biofilm, Slough Level: Skin/Subcutaneous Tissue Debridement Description: Excisional Instrument: Curette Bleeding: Minimum Hemostasis Achieved:  Pressure End Time: 11:01 Procedural Pain: 0 Post Procedural Pain: 0 Response to Treatment: Procedure was tolerated well Level of Consciousness (Post- Awake and Alert procedure): Post Debridement Measurements of Total Wound Length: (cm) 12 Width: (cm) 6 Depth: (cm) 0.2 Volume: (cm) 11.31 Character of Wound/Ulcer Post Debridement: Improved Severity of Tissue Post Debridement: Fat layer exposed Post Procedure Diagnosis Same as Pre-procedure Electronic Signature(s) Signed: 07/10/2022 4:14:50 PM By: Jamie Keeler PA-C Signed: 07/10/2022 4:55:19 PM By: Jamie Pilling RN, BSN Entered By: Jamie [name] on 07/10/2022 11:01:40 Jamie [name] ([id number] Page 2 of 9 -------------------------------------------------------------------------------- HPI Details Patient Name: Date of Service: Jamie [name], [place]. 07/10/2022 10:15 A M Medical Record Number: [id number] Patient Account Number: [id number] Date of Birth/Sex: Treating RN: 25-Mar-1941 (82 y.o. F) Primary Care Provider: Ozella [name] Other Clinician: Referring Provider: Treating Provider/Extender: Jamie [name], Jamie [name] in Treatment: 7 History of Present Illness HPI Description: ADMISSION 06/30/2020 Jamie [name] is an 82 year old woman who lives in [place]. She is here with her niece for review of wounds on the left medial lower leg and ankle. These have apparently been present for over a year and she followed with Jamie [name] at the [facility] in [place] for quite a period of time although it looks as though there was a initial consult wound from Jamie [name] on February 18 presumably there was therefore hiatus. At that point the wounds were described as being there for 3 months. She also tells me she was at the wound care center in [place] for a period of time with this. 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 She had venous studies that were negative for DVT ABIs on the right were 1.01 on the left 1.06. She has . had previous applications of puraply, compression which she does not tolerate very well. She has had several rounds of oral antibiotic therapy. She complains of unrelenting pain and she is seeing Dr. Crist Fat [name] of pain management apparently was on oxycodone but  that did not help. She also had a skin biopsy done by Jamie [name] although we do not have that result. She does not appear to have an arterial issue. I am not completely clear what she has been putting on the wounds lately. 3/31; this patient has a particularly nasty set of wounds on the left medial ankle in the middle of what looks to be hemosiderin deposition secondary to chronic venous insufficiency. She has a lot of pain followed by pain management. Jamie [name] apparently did a biopsy of something on the left leg last fall what I would like to get this result. She has a history of MRSA treatment. I do not believe she had reflux studies but she did have DVT rule out studies. Previous ABIs have not suggested arterial insufficiency 4/7; difficult wounds on the left medial ankle probably chronic venous insufficiency. With considerable effort on behalf of our case manager we were able to finally to speak to somebody at the hospital in [place] who indicated that no biopsy of this area have been done even though the patient describes this in some detail and is even able to point out where she thinks the biopsy was done. We have been using Sorbact. The PCR culture I did showed polymicrobial identification with Pseudomonas, staph aureus, Peptostreptococcus. All of this and low titers. Resistance genes identified were MRSA, staph virulence gene and tetracycline. We are going to send this to [facility] for a topical antibiotic which is something that we have had good success with recently in large venous ulcers with a lot of purulent  drainage. There would not be an easy oral alternative here possibly line escalated and ciprofloxacin if we need to use systemic antibiotic 4/15; difficult area on the left medial ankle. Most likely chronic venous insufficiency. I think she will probably need venous reflux study I think she had DVT rule outs but not venous reflux studies. We have not yet obtained the topical antibiotics. She has home health changing the dressing we have been using Sorbact for adherent fibrinous debris on the surface. Very difficult to remove 4/22; patient presents for 1 week follow-up. She has been using sore back under compression wraps and these are changed 3 times a week with home health. She also had [facility] antibiotics sent to her house and brought them in today. She has no complaints or issues today. 5/2; patient is here for follow-up. She has been using Sorbact under compression. Very painful wound. She has been using [facility] antibiotics. Not much improvement although the more medial part of the wound has cleaned up nicely and the larger part of the wound about 50% slough covered. Part of the issue here is that she had stays at both [facility] wound care center, [facility] wound care center and now [place]. Not sure if she has had venous reflux studies. As far as we are able to tell she did not have a biopsy. My notes state that she did not have venous reflux studies just DVT rule outs. 5/16; patient goes for venous reflux studies this afternoon. She says that wound was biopsied which sounds like punch biopsies by Dr. Tye [place] we do not have these results. We are using Sorbact. Very difficult wound to debride 5/23; patient presents for 1 week follow-up. She reports tolerating the wraps well with sorbact underneath. She had ABIs and venous reflux studies done. She has no issues or complaints today. She denies acute signs of infection. 6/6; I have reviewed the patient's vascular studies. Wounds  are on the left medial and  posterior calf. She had significant reflux in the greater saphenous vein in the in the mid thigh, distal thigh knee and the small saphenous vein in the popliteal fossa. The vein diameters do not look too impressive though. She is going to see the vascular surgeon on Wednesday. She also had venous reflux in the right common femoral vein. She did not have any evidence of a DVT or SVT I am wondering whether there is an ablation procedure that would benefit her in the greater saphenous vein on the left. . She tells [place] that home health put the dressing on too tight and she took off 1 layer. The swelling in her left leg is a little worse as a result of this. Not much change in the wound measurements so the surface of the wound looks better Her arterial studies showed an ABI on the right of 1.14 with a triphasic waveform and a great toe pressure of 0.89. On the left her ABIs were noncompressible at 1.34 but with triphasic waveforms and a TBI of 0.97. Her greater toe pressure was 122. There was no evidence of significant bilateral arterial disease 6/20 patient went to see Dr. Doren Custard. He did not think she had significant arterial disease. In terms of her venous duplex on the right side there was no evidence of a DVT or SVT there was deep venous reflux involving the common femoral vein no superficial vein reflux on the left side there was no DVT or SVT there was no deep vein graft reflux there was some reflux in the greater saphenous vein from the mid thigh to the knee but the vein here was not dilated. He thought these were venous wounds he prescribed a compression pump but I am not sure who we ordered it from The patient has been approved for Apligraf. Still using silver collagen this week 7/5; Apligraf #1 7/19 Apligraf #2. Decent improvement in the condition of the wound bed. Epithelialization distal 8/2 Apligraf #3. No issues or complaints. Denies signs of infection. 8/17 Apligraf #4. No issues or  concerns. Some complaints of pruritus and the rash. Our intake nurse brought up the fact that she had previously indicated possible cotton layer sensitivity we will therefore use kerlix in the bottom layer of the compression 8/30; the patient comes in with the area on her left medial leg just about healed. There is a superficial area more towards the tibia and a smaller open area distally everything else is epithelialized. I do not think she requires another Apligraf 9/13; left medial leg is healed. 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 Her edema control is good Readmisstion: 05-22-2022 upon evaluation today patient presents for initial inspection here in our clinic concerning issues that she has been having with a wound of the right lateral lower extremity. This is an area of a previous skin graft she tells me. With that being said she unfortunately had a scrape on this that occurred around 10 January. Since that time she has noted that this has just continued to get bigger and bigger in her niece who is present with her today actually states that 2 Jamie [name], Jamie [name] ([id number]) [id number] Page 3 of 9 [name] ago when she saw that it was significantly smaller than what she sees currently. Obviously this is of utmost concern as we do not want this to continue to get larger when arrested and get moving in  the right direction. Fortunately there does not appear to be any signs of systemic infection though locally I think we probably do have some infection present. I would obtain a culture to see what we have going on here and then we will subsequently see where things go going forward. Fortunately I think that she is in the right place at this point being at the wound center we can definitely do something to try to get this moving in the right direction. Patient does have a history of chronic venous  insufficiency as well as coronary artery disease. In the past she has done well with compression wraps on the start with a 3 layer wrap I think she is probably can end up going to a 4-layer at some point but we will see how things do over the next week. She has been using wound cleanser along with she tells me a zinc and topical but again I am not sure exactly what that was. 05-29-2022 upon evaluation today patient appears to be doing well currently in regard to her wound. This actually is showing signs of improvement I am happy in that regard unfortunately her left leg has an area on the shin that has opened. This is the region where one of the areas at least we have previously taken care of. Nonetheless this is small hoping we get it under control before things worsen significantly. 06-05-2022 upon evaluation today patient appears to be doing well currently in regard to her wounds. The actually seem to be fairly clean she is having still quite a bit of problem with pain at this point she would like to not have debridement today for all possible. With that being said I do believe that we are making some good progress I think the [facility] is doing a decent job here. 3/6; patient has had 3/6; patient with known severe chronic venous insufficiency. She apparently had a traumatic wound at home and has a reasonably substantial wound on the right lateral lower leg and a smaller one on the left anterior lower leg as well. We have been using Hydrofera Blue and Unna boots 3/13; patient presents for follow-up. We have been using Hydrofera Blue under Coflex to the legs bilaterally. She has no issues or complaints today. 06-26-2022 upon evaluation today patient appears to be doing well currently in regard to her wound. This is measuring a little bit larger however. Fortunately I do not see any signs of infection this is on the right side. Fortunately the left side is actually completely healed which is great  news. 07-03-2022 patient's wound unfortunately is continuing to show signs of being worse. Her compression which was the Tubigrip that I thought should be able to pull up actually slipped down and she was not able to pull that back up. With that being said that means that unfortunately her wound has continued to deteriorate since I last saw her. This is definitely not the direction that we are looking for. I discussed with her that I do believe we need to go ahead and see about getting her started on antibiotics and I subsequently would also like to go ahead and see about getting things moving forward with regard to a wound culture and making a change up her dressings as well but this can be changed more frequently than just once a week. 07-10-2022 upon evaluation today patient actually appears to be doing much better. I do believe that the antibiotics have been beneficial for her.  Fortunately I do not see any signs of active infection locally nor systemically which is great news. No fevers, chills, nausea, vomiting, or diarrhea. Electronic Signature(s) Signed: 07/10/2022 1:53:44 PM By: Jamie Keeler PA-C Entered By: Jamie [name] on 07/10/2022 13:53:44 -------------------------------------------------------------------------------- Physical Exam Details Patient Name: Date of Service: Jamie [name]. 07/10/2022 10:15 A M Medical Record Number: [id number] Patient Account Number: [id number] Date of Birth/Sex: Treating RN: July 27, 1940 (82 y.o. F) Primary Care Provider: Ozella [name] Other Clinician: Referring Provider: Treating Provider/Extender: Jamie [name], Jamie [name] in Treatment: 7 Constitutional Well-nourished and well-hydrated in no acute distress. Respiratory normal breathing without difficulty. Psychiatric this patient is able to make decisions and demonstrates good insight into disease process. Alert and Oriented x 3. pleasant and  cooperative. Notes Upon inspection patient's wound bed actually showed signs again of much improvement I did perform a fairly aggressive debridement clearing away around the edges of the wound as well as the wound itself fairly well today she tolerated that without complication. Again the culture did show Pseudomonas which was exactly what I was expecting based on the physical exam findings last week and the good news is I put her on the Cipro which has done extremely well for her she has 1 more week of this. We can see how things look come next week but in general I am very pleased with where we stand today. Electronic Signature(s) Signed: 07/10/2022 1:54:19 PM By: Jamie Keeler PA-C Entered By: Jamie [name] on 07/10/2022 13:54:19 -------------------------------------------------------------------------------- Physician Orders Details Patient Name: Date of Service: Jamie [name], Jamie Dark W. 07/10/2022 10:15 A M Medical Record Number: [id number] Patient Account Number: [id number] Date of Birth/Sex: Treating RN: 05-Oct-1940 (82 y.o. Debby Bud Primary Care Provider: Ozella [name] Other Clinician: Referring Provider: Treating Provider/Extender: Jamie [name], [place], [place] ([id number]) [id number] Page 4 of 9 [name] in Treatment: 7 Verbal / Phone Orders: No Diagnosis Coding ICD-10 Coding Code Description I87.331 Chronic venous hypertension (idiopathic) with ulcer and inflammation of right lower extremity 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d L97.822 Non-pressure chronic ulcer of other part of left lower leg with fat layer exposed I25.10 Atherosclerotic heart disease of native coronary artery without angina pectoris Follow-up Appointments ppointment in 1 week. - [facility] Rm # 8 Wednesday 07/17/22 @ 10:15 one Return A ppointment in 2 [name]. - w/ Margarita Grizzle  [facility] and [facility] Rm # 8 Wednesday 07/24/22 @ 10:15 one Return A Other: - Sending over home health orders now to [facility]. Anesthetic Wound #5 Right,Lateral Lower Leg (In clinic) Topical Lidocaine 4% applied to wound bed Bathing/ Shower/ Hygiene May shower with protection but do not get wound dressing(s) wet. Protect dressing(s) with water repellant cover (for example, large plastic bag) or a cast cover and may then take shower. - may purchase cast protector from [facility], [facility] or [employer] Edema Control - Lymphedema / SCD / Other Right Lower Extremity Elevate legs to the level of the heart or above for 30 minutes daily and/or when sitting for 3-4 times a day throughout the day. Avoid standing for long periods of time. Exercise regularly Moisturize legs daily. - Apply to left leg every night before bedtime. Compression stocking or Garment 20-30 mm/Hg pressure to: - Apply to left leg every morning and remove every night. Home Health Admit to [facility] for skilled nursing wound care. May utilize formulary equivalent dressing  for wound treatment orders unless otherwise specified. - T be changed Monday and Friday by [facility], Wednesday by [facility] wound care orders this week; continue Home Health for wound care. May utilize formulary equivalent dressing for wound treatment orders unless otherwise specified. Other Home Health Orders/Instructions: - [facility] [place] Wound Treatment Wound #5 - Lower Leg Wound Laterality: Right, Lateral Cleanser: Soap and Water 3 x Per Week/30 Days Discharge Instructions: May shower and wash wound with dial antibacterial soap and water prior to dressing change. Peri-Wound Care: Sween Lotion (Moisturizing lotion) 3 x Per Week/30 Days Discharge Instructions: Apply moisturizing lotion as directed Prim Dressing: Hydrofera Blue Ready Transfer Foam, 4x5 (in/in) (Generic) 3 x Per Week/30 Days ary Discharge  Instructions: Apply to wound bed as instructed Secondary Dressing: Zetuvit Plus 4x8 in (Generic) 3 x Per Week/30 Days Discharge Instructions: Apply over primary dressing as directed. Secured With: [employer], 4.5x3.1 (in/yd) (Generic) 3 x Per Week/30 Days Discharge Instructions: Secure with Kerlix as directed. Secured With: 57M Medipore H Soft Cloth Surgical T ape, 4 x 10 (in/yd) (Generic) 3 x Per Week/30 Days Discharge Instructions: Secure with tape as directed. Compression Wrap: Tubigrip Size E (Generic) 3 x Per Week/30 Days Discharge Instructions: applied one layer to right leg. Compression Stockings: Circaid Juxta Lite Compression Wrap Right Leg Compression Amount: 20-30 mmHG Discharge Instructions: Apply Circaid Juxta Lite Compression Wrap daily as instructed. Apply first thing in the morning, remove at night before bed. Electronic Signature(s) Signed: 07/10/2022 4:14:50 PM By: Jamie Keeler PA-C Signed: 07/10/2022 4:55:19 PM By: Jamie Pilling RN, BSN Entered By: Jamie [name] on 07/10/2022 11:04:43 Jamie [name] ([id number] Page 5 of 9 -------------------------------------------------------------------------------- Problem List Details Patient Name: Date of Service: Jamie [name], [place]. 07/10/2022 10:15 A M Medical Record Number: [id number] Patient Account Number: [id number] Date of Birth/Sex: Treating RN: January 02, 1941 (82 y.o. F) Primary Care Provider: Ozella [name] Other Clinician: Referring Provider: Treating Provider/Extender: Jamie [name], Jamie [name] in Treatment: 7 Active Problems ICD-10 Encounter Code Description Active Date MDM Diagnosis I87.331 Chronic venous hypertension (idiopathic) with ulcer and inflammation of right 05/22/2022 No Yes lower extremity L97.812 Non-pressure chronic ulcer of other part of right lower leg with fat layer 05/22/2022 No Yes exposed L97.822 Non-pressure  chronic ulcer of other part of left lower leg with fat layer exposed2/21/2024 No Yes I25.10 Atherosclerotic heart disease of native coronary artery without angina pectoris 05/22/2022 No Yes Inactive Problems Resolved Problems Electronic Signature(s) Signed: 07/10/2022 10:15:29 AM By: Jamie Keeler PA-C Entered By: Jamie [name] on 07/10/2022 10:15:29 -------------------------------------------------------------------------------- Progress Note Details Patient Name: Date of Service: Jamie [name], Jamie Dark W. 07/10/2022 10:15 A M Medical Record Number: [id number] Patient Account Number: [id number] Date of Birth/Sex: Treating RN: [birth year] (82 y.o. F) Primary Care Provider: Ozella [name] Other Clinician: Referring Provider: Treating Provider/Extender: Jamie [name], Jamie [name] in Treatment: 7 Subjective Chief Complaint Information obtained from Patient Bilateral LE Ulcers History of Present Illness (HPI) ADMISSION 06/30/2020 Mrs. Sanft is an 82 year old woman who lives in [place]. She is here with her niece for review of wounds on the left medial lower leg and ankle. These have apparently been present for over a year and she followed with Jamie [name] at the [facility] in [place] for quite a period of time although it looks as though there was a initial consult wound from Jamie [name] on February 18 presumably there was therefore hiatus.  At that point the wounds were described as being there for 3 months. She also tells me she was at the wound care center in [place] for a period of time with this. 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 She had venous studies that were negative for DVT ABIs on the right were 1.01 on the left 1.06. She has . had previous applications of puraply, compression which she does not tolerate very well. She has had several rounds of oral antibiotic therapy. She complains of  unrelenting pain and she is seeing Dr. Crist Fat [name] of pain management apparently was on oxycodone but that did not help. She also had a skin biopsy done by Jamie [name] although we do not have that result. She does not appear to have an arterial issue. I am not completely clear what she has been putting on the wounds lately. 3/31; this patient has a particularly nasty set of wounds on the left medial ankle in the middle of what looks to be hemosiderin deposition secondary to chronic Jamie [name], Jamie [name] ([id number]) [id number] Page 6 of 9 venous insufficiency. She has a lot of pain followed by pain management. Jamie [name] apparently did a biopsy of something on the left leg last fall what I would like to get this result. She has a history of MRSA treatment. I do not believe she had reflux studies but she did have DVT rule out studies. Previous ABIs have not suggested arterial insufficiency 4/7; difficult wounds on the left medial ankle probably chronic venous insufficiency. With considerable effort on behalf of our case manager we were able to finally to speak to somebody at the hospital in [place] who indicated that no biopsy of this area have been done even though the patient describes this in some detail and is even able to point out where she thinks the biopsy was done. We have been using Sorbact. The PCR culture I did showed polymicrobial identification with Pseudomonas, staph aureus, Peptostreptococcus. All of this and low titers. Resistance genes identified were MRSA, staph virulence gene and tetracycline. We are going to send this to [facility] for a topical antibiotic which is something that we have had good success with recently in large venous ulcers with a lot of purulent drainage. There would not be an easy oral alternative here possibly line escalated and ciprofloxacin if we need to use systemic antibiotic 4/15; difficult area on the left medial ankle. Most likely chronic  venous insufficiency. I think she will probably need venous reflux study I think she had DVT rule outs but not venous reflux studies. We have not yet obtained the topical antibiotics. She has home health changing the dressing we have been using Sorbact for adherent fibrinous debris on the surface. Very difficult to remove 4/22; patient presents for 1 week follow-up. She has been using sore back under compression wraps and these are changed 3 times a week with home health. She also had [facility] antibiotics sent to her house and brought them in today. She has no complaints or issues today. 5/2; patient is here for follow-up. She has been using Sorbact under compression. Very painful wound. She has been using [facility] antibiotics. Not much improvement although the more medial part of the wound has cleaned up nicely and the larger part of the wound about 50% slough covered. Part of the issue here is that she had stays at both [facility] wound care center, [facility] wound care center and now [place]. Not  sure if she has had venous reflux studies. As far as we are able to tell she did not have a biopsy. My notes state that she did not have venous reflux studies just DVT rule outs. 5/16; patient goes for venous reflux studies this afternoon. She says that wound was biopsied which sounds like punch biopsies by Dr. Tye [place] we do not have these results. We are using Sorbact. Very difficult wound to debride 5/23; patient presents for 1 week follow-up. She reports tolerating the wraps well with sorbact underneath. She had ABIs and venous reflux studies done. She has no issues or complaints today. She denies acute signs of infection. 6/6; I have reviewed the patient's vascular studies. Wounds are on the left medial and posterior calf. She had significant reflux in the greater saphenous vein in the in the mid thigh, distal thigh knee and the small saphenous vein in the popliteal fossa. The vein diameters do not look too  impressive though. She is going to see the vascular surgeon on Wednesday. She also had venous reflux in the right common femoral vein. She did not have any evidence of a DVT or SVT I am wondering whether there is an ablation procedure that would benefit her in the greater saphenous vein on the left. . She tells [place] that home health put the dressing on too tight and she took off 1 layer. The swelling in her left leg is a little worse as a result of this. Not much change in the wound measurements so the surface of the wound looks better Her arterial studies showed an ABI on the right of 1.14 with a triphasic waveform and a great toe pressure of 0.89. On the left her ABIs were noncompressible at 1.34 but with triphasic waveforms and a TBI of 0.97. Her greater toe pressure was 122. There was no evidence of significant bilateral arterial disease 6/20 patient went to see Dr. Doren Custard. He did not think she had significant arterial disease. In terms of her venous duplex on the right side there was no evidence of a DVT or SVT there was deep venous reflux involving the common femoral vein no superficial vein reflux on the left side there was no DVT or SVT there was no deep vein graft reflux there was some reflux in the greater saphenous vein from the mid thigh to the knee but the vein here was not dilated. He thought these were venous wounds he prescribed a compression pump but I am not sure who we ordered it from The patient has been approved for Apligraf. Still using silver collagen this week 7/5; Apligraf #1 7/19 Apligraf #2. Decent improvement in the condition of the wound bed. Epithelialization distal 8/2 Apligraf #3. No issues or complaints. Denies signs of infection. 8/17 Apligraf #4. No issues or concerns. Some complaints of pruritus and the rash. Our intake nurse brought up the fact that she had previously indicated possible cotton layer sensitivity we will therefore use kerlix in the bottom layer of the  compression 8/30; the patient comes in with the area on her left medial leg just about healed. There is a superficial area more towards the tibia and a smaller open area distally everything else is epithelialized. I do not think she requires another Apligraf 9/13; left medial leg is healed. 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 Her edema control is good Readmisstion: 05-22-2022 upon evaluation today patient presents for initial inspection here in our  clinic concerning issues that she has been having with a wound of the right lateral lower extremity. This is an area of a previous skin graft she tells me. With that being said she unfortunately had a scrape on this that occurred around 10 January. Since that time she has noted that this has just continued to get bigger and bigger in her niece who is present with her today actually states that 2 [name] ago when she saw that it was significantly smaller than what she sees currently. Obviously this is of utmost concern as we do not want this to continue to get larger when arrested and get moving in the right direction. Fortunately there does not appear to be any signs of systemic infection though locally I think we probably do have some infection present. I would obtain a culture to see what we have going on here and then we will subsequently see where things go going forward. Fortunately I think that she is in the right place at this point being at the wound center we can definitely do something to try to get this moving in the right direction. Patient does have a history of chronic venous insufficiency as well as coronary artery disease. In the past she has done well with compression wraps on the start with a 3 layer wrap I think she is probably can end up going to a 4-layer at some point but we will see how things do over the next week. She has been using wound cleanser along with she tells  me a zinc and topical but again I am not sure exactly what that was. 05-29-2022 upon evaluation today patient appears to be doing well currently in regard to her wound. This actually is showing signs of improvement I am happy in that regard unfortunately her left leg has an area on the shin that has opened. This is the region where one of the areas at least we have previously taken care of. Nonetheless this is small hoping we get it under control before things worsen significantly. 06-05-2022 upon evaluation today patient appears to be doing well currently in regard to her wounds. The actually seem to be fairly clean she is having still quite a bit of problem with pain at this point she would like to not have debridement today for all possible. With that being said I do believe that we are making some good progress I think the [facility] is doing a decent job here. 3/6; patient has had 3/6; patient with known severe chronic venous insufficiency. She apparently had a traumatic wound at home and has a reasonably substantial wound on the right lateral lower leg and a smaller one on the left anterior lower leg as well. We have been using Hydrofera Blue and Unna boots 3/13; patient presents for follow-up. We have been using Hydrofera Blue under Coflex to the legs bilaterally. She has no issues or complaints today. 06-26-2022 upon evaluation today patient appears to be doing well currently in regard to her wound. This is measuring a little bit larger however. Fortunately I do not see any signs of infection this is on the right side. Fortunately the left side is actually completely healed which is great news. 07-03-2022 patient's wound unfortunately is continuing to show signs of being worse. Her compression which was the Tubigrip that I thought should be able to pull up actually slipped down and she was not able to pull that back up. With that being said that means  that unfortunately her wound has continued  to deteriorate since I last saw her. This is definitely not the direction that we are looking for. I discussed with her that I do believe we need to go ahead and see about getting her started on antibiotics and I subsequently would also like to go ahead and see about getting things moving forward with regard to a wound culture and making a change up her dressings as well but this can be changed more frequently than just once a week. 07-10-2022 upon evaluation today patient actually appears to be doing much better. I do believe that the antibiotics have been beneficial for her. Fortunately I do not see any signs of active infection locally nor systemically which is great news. No fevers, chills, nausea, vomiting, or diarrhea. Jamie [name], Jamie [name] ([id number]) [id number] Page 7 of 9 Objective Constitutional Well-nourished and well-hydrated in no acute distress. Vitals Time Taken: 10:28 AM, Height: 62 in, Weight: 171 lbs, BMI: 31.3, Temperature: 98.4 F, [name]: 76 bpm, Respiratory Rate: 18 breaths/min, Blood Pressure: 155/91 mmHg. Respiratory normal breathing without difficulty. Psychiatric this patient is able to make decisions and demonstrates good insight into disease process. Alert and Oriented x 3. pleasant and cooperative. General Notes: Upon inspection patient's wound bed actually showed signs again of much improvement I did perform a fairly aggressive debridement clearing away around the edges of the wound as well as the wound itself fairly well today she tolerated that without complication. Again the culture did show Pseudomonas which was exactly what I was expecting based on the physical exam findings last week and the good news is I put her on the Cipro which has done extremely well for her she has 1 more week of this. We can see how things look come next week but in general I am very pleased with where we stand today. Integumentary (Hair, Skin) Wound #5  status is Open. Original cause of wound was Trauma. The date acquired was: 04/17/2022. The wound has been in treatment 7 [name]. The wound is located on the Right,Lateral Lower Leg. The wound measures 12cm length x 6cm width x 0.1cm depth; 56.549cm^2 area and 5.655cm^3 volume. There is Fat Layer (Subcutaneous Tissue) exposed. There is no tunneling or undermining noted. There is a medium amount of serosanguineous drainage noted. The wound margin is distinct with the outline attached to the wound base. There is medium (34-66%) red, friable granulation within the wound bed. There is a medium (34- 66%) amount of necrotic tissue within the wound bed including Adherent Slough. The periwound skin appearance exhibited: Scarring, Dry/Scaly, Hemosiderin Staining. The periwound skin appearance did not exhibit: Callus, Crepitus, Excoriation, Induration, Rash, Maceration, Atrophie Blanche, Cyanosis, Ecchymosis, Mottled, Pallor, Rubor, Erythema. The periwound has tenderness on palpation. 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 There was a Excisional Skin/Subcutaneous Tissue Debridement with a total area of 39 sq cm performed by Jamie Keeler, PA. With the following instrument(s): Curette to remove Viable and Non-Viable tissue/material. Material removed includes Eschar, Subcutaneous Tissue, Slough, Skin: Dermis, Skin: Epidermis, and Biofilm after  achieving pain control using Lidocaine 4% Topical Solution. A time  out was conducted at 10:45, prior to the start of the procedure. A Minimum amount of bleeding was controlled with Pressure. The procedure was tolerated well with a pain level of 0 throughout and a pain level of 0 following the procedure. Post Debridement Measurements: 12cm length x 6cm width x 0.2cm depth; 11.31cm^3 volume. Character of Wound/Ulcer Post Debridement is improved. Severity of Tissue Post Debridement is: Fat layer exposed. Post procedure Diagnosis Wound #5: Same as Pre-Procedure Plan Follow-up Appointments: Return Appointment in 1 week. - w/ Margarita Grizzle ST and Bobbi Rm # 8 Wednesday 07/17/22 @ 10:15 one Return Appointment in 2 [name]. - w/ Margarita Grizzle ST and Bobbi Rm # 8 Wednesday 07/24/22 @ 10:15 one Other: - Sending over home health orders now to [place] [facility]. Anesthetic: Wound #5 Right,Lateral Lower Leg: (In clinic) Topical Lidocaine 4% applied to wound bed Bathing/ Shower/ Hygiene: May shower with protection but do not get wound dressing(s) wet. Protect dressing(s) with water repellant cover (for example, large plastic bag) or a cast cover and may then take shower. - may purchase cast protector from [facility], [facility] or [employer] [place] ([id number]) [id number] Page 8 of 9 Edema Control - Lymphedema / SCD / Other: Elevate legs to the level of the heart or above for 30 minutes daily and/or when sitting for 3-4 times a day throughout the day. Avoid standing for long periods of time. Exercise regularly Moisturize legs daily. - Apply to left leg every night before bedtime. Compression stocking or Garment 20-30 mm/Hg pressure to: - Apply to left leg every morning and remove every night. Home Health: Admit to Home Health for skilled nursing wound care. May utilize formulary equivalent dressing for wound treatment orders unless otherwise specified. - T be o changed Monday and Friday by [place], Wednesday by [place] wound care orders  this week; continue Home Health for wound care. May utilize formulary equivalent dressing for wound treatment orders unless otherwise specified. Other Home Health Orders/Instructions: - [facility] [place] WOUND #5: - Lower Leg Wound Laterality: Right, Lateral Cleanser: Soap and Water 3 x Per Week/30 Days Discharge Instructions: May shower and wash wound with dial antibacterial soap and water prior to dressing change. Peri-Wound Care: Sween Lotion (Moisturizing lotion) 3 x Per Week/30 Days Discharge Instructions: Apply moisturizing lotion as directed Prim Dressing: Hydrofera Blue Ready Transfer Foam, 4x5 (in/in) (Generic) 3 x Per Week/30 Days ary Discharge Instructions: Apply to wound bed as instructed Secondary Dressing: Zetuvit Plus 4x8 in (Generic) 3 x Per Week/30 Days Discharge Instructions: Apply over primary dressing as directed. Secured With: [employer], 4.5x3.1 (in/yd) (Generic) 3 x Per Week/30 Days Discharge Instructions: Secure with Kerlix as directed. Secured With: 74M Medipore H Soft Cloth Surgical T ape, 4 x 10 (in/yd) (Generic) 3 x Per Week/30 Days Discharge Instructions: Secure with tape as directed. Com pression Wrap: Tubigrip Size E (Generic) 3 x Per Week/30 Days Discharge Instructions: applied one layer to right leg. Com pression Stockings: Circaid Juxta Lite Compression Wrap Compression Amount: 20-30 mmHg (right) Discharge Instructions: Apply Circaid Juxta Lite Compression Wrap daily as instructed. Apply first thing in the morning, remove at night before bed. 1. I am good recommend that the patient should continue with the Cipro she already had that prescribed last week she will complete that week and then we will see where we stand next week. 2. I am also can recommend  the patient should continue to monitor for any signs of infection or worsening. Office if anything changes she knows contact the office and let me know. We will  see patient back for reevaluation in 1 week here in the clinic. If anything worsens or changes patient will contact our office for additional recommendations. Electronic Signature(s) Signed: 07/10/2022 1:55:33 PM By: Jamie Keeler PA-C Entered By: Jamie [name] on 07/10/2022 13:55:32 -------------------------------------------------------------------------------- SuperBill Details Patient Name: Date of Service: Jamie [name], [place]. 07/10/2022 Medical Record Number: [id number] Patient Account Number: [id number] Date of Birth/Sex: Treating RN: 1940/12/28 (82 y.o. Jamie [name], Jamie [name] Primary Care Provider: Ozella [name] Other Clinician: Referring Provider: Treating Provider/Extender: Jamie [name], Jamie [name] in Treatment: 7 Diagnosis Coding ICD-10 Codes Code Description [phone] Chronic venous hypertension (idiopathic) with ulcer and inflammation of right lower extremity 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d L97.822 Non-pressure chronic ulcer of other part of left lower leg with fat layer exposed I25.10 Atherosclerotic heart disease of native coronary artery without angina pectoris Facility Procedures : CPT4 Code: [id number] Description: [id number] - DEB SUBQ TISSUE 20 SQ CM/< ICD-10 Diagnosis Description [id number] Non-pressure chronic ulcer of other part of right lower leg with fat layer expo Modifier: sed Quantity: 1 FRIMY, YUH Code: [id number] ARGUERITE [id number] [phone] Description: 11045 - DEB SUBQ TISS EA ADDL 20CM ICD-10 Diagnosis Description [id number] Non-pressure chronic ulcer of other part of right lower leg with fat layer expo [id number] Modifier: [id number] Pa Quantity: 1 ge 9 of 9 Physician Procedures : CPT4 Code Description Modifier [id number] 11042 - WC PHYS SUBQ TISS 20 SQ CM ICD-10 Diagnosis Description [id number] Non-pressure chronic ulcer of other part of right lower leg with  fat layer exposed Quantity: 1 : [id number] 11045 - WC PHYS SUBQ TISS EA ADDL 20 CM ICD-10 Diagnosis Description [id number] Non-pressure chronic ulcer of other part of right lower leg with fat layer exposed Quantity: 1 Electronic Signature(s) Signed: 07/10/2022 1:57:21 PM By: Jamie Keeler PA-C Entered By: Jamie [name] on 07/10/2022 13:57:21

## 2022-07-10 NOTE — Progress Notes (Signed)
[name], [name] ([id number]) [id number] Page 1 of 5 Visit Report for 07/10/2022 Arrival Information Details Patient Name: Date of Service: Jamie [name], [place]. 07/10/2022 10:15 A M Medical Record Number: [id number] Patient Account Number: [id number] Date of Birth/Sex: Treating RN: July 18, 1940 (82 y.o. F) Primary Care Jamie [name]: Jamie [name] Other Clinician: Referring Jamie [name]: Treating Jamie [name]/Extender: Jamie [name], Jamie [name] in Treatment: 7 Visit Information History Since Last Visit Added or deleted any medications: Yes Patient Arrived: Wheel Chair Any new allergies or adverse reactions: No Arrival Time: 10:14 Had a fall or experienced change in No Accompanied By: niece activities of daily living that may affect Transfer Assistance: None risk of falls: Patient Identification Verified: Yes Signs or symptoms of abuse/neglect since last visito No Secondary Verification Process Completed: Yes Hospitalized since last visit: No Patient Requires Transmission-Based Precautions: No Implantable device outside of the clinic excluding No Patient Has Alerts: No cellular tissue based products placed in the center since last visit: Has Dressing in Place as Prescribed: Yes Has Compression in Place as Prescribed: No Pain Present Now: Yes Electronic Signature(s) Signed: 07/10/2022 4:50:23 PM By: Jamie [name] Entered By: Jamie [name] on 07/10/2022 10:28:35 -------------------------------------------------------------------------------- Encounter Discharge Information Details Patient Name: Date of Service: Jamie [name], Jamie Dark W. 07/10/2022 10:15 A M Medical Record Number: [id number] Patient Account Number: [id number] Date of Birth/Sex: Treating RN: 1940/10/13 (82 y.o. Jamie [name] Primary Care Jamie [name]: Jamie [name] Other Clinician: Referring Jamie [name]: Treating Jamie [name]/Extender: Jamie [name], Jamie [name] in Treatment: 7 Encounter Discharge Information Items Post Procedure Vitals Discharge Condition: Stable Temperature (F): 98.4 Ambulatory Status: Wheelchair Pulse (bpm): 76 Discharge Destination: Home Respiratory Rate (breaths/min): 18 Transportation: Private Auto Blood Pressure (mmHg): 155/91 Accompanied By: family member Schedule Follow-up Appointment: Yes Clinical Summary of Care: Electronic Signature(s) Signed: 07/10/2022 4:55:19 PM By: Jamie Pilling RN, BSN Entered By: Jamie [name] on 07/10/2022 11:06:08 -------------------------------------------------------------------------------- Lower Extremity Assessment Details Patient Name: Date of Service: Jamie Rote W. 07/10/2022 10:15 A M Medical Record Number: [id number] Patient Account Number: [id number] Date of Birth/Sex: Treating RN: [birth year] (82 y.o. F) Primary Care Kaliope Quinonez: Jamie [name] Other Clinician: Referring Jamie [name]: Treating Jayvier Burgher/Extender: Jamie [name], Jamie [name] in Treatment: 7 Edema Assessment W[LeftJudith [name] [id number] [id number] Page 2 of 5] Assessed: [Left: No] [Right: No] Edema: [Left: Ye] [Right: s] Calf Left: Right: Point of Measurement: From Medial Instep 39 cm Ankle Left: Right: Point of Measurement: From Medial Instep 25 cm Electronic Signature(s) Signed: 07/10/2022 4:50:23 PM By: Jamie [name] Entered By: Jamie [name] on 07/10/2022 10:29:59 -------------------------------------------------------------------------------- Multi-Disciplinary Care Plan Details Patient Name: Date of Service: Jamie [name], Jamie Dark W. 07/10/2022 10:15 A M Medical Record Number: [id number] Patient Account Number: [id number] Date of Birth/Sex: Treating RN: 04/30/1940 (82 y.o. 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 Compression as ordered Provide education on venous insufficiency Notes: Wound/Skin Impairment Nursing Diagnoses: Impaired tissue integrity Knowledge deficit related to ulceration/compromised skin integrity Goals: Patient/caregiver will verbalize understanding of skin care regimen Date  Initiated: 05/22/2022 Target Resolution Date: 08/09/2022 Goal Status: Active Ulcer/skin breakdown will have a volume reduction of 30% by week 4 Date Initiated: 05/22/2022 Date Inactivated: 07/10/2022 Target Resolution Date: 07/06/2022 Unmet Reason: see wound Goal Status: Unmet measurement. Interventions: Assess patient/caregiver ability to obtain necessary supplies Assess patient/caregiver ability to perform ulcer/skin care regimen upon admission and as needed Assess ulceration(s) every visit Provide education on ulcer and skin care Notes: Jamie, [name] ([id number]) [id number] Page 3 of 5 Electronic Signature(s) Signed: 07/10/2022 4:55:19 PM By: Jamie Pilling RN, BSN Entered By: Jamie [name] on 07/10/2022 11:04:49 -------------------------------------------------------------------------------- Pain Assessment Details Patient Name: Date of Service: Jamie [name]. 07/10/2022 10:15 A M Medical Record Number: [id number] Patient Account Number:  [id number] Date of Birth/Sex: Treating RN: 09-Apr-1940 (82 y.o. F) Primary Care Iverson Sees: Jamie [name] Other Clinician: Referring Alvester Eads: Treating Jaidev Sanger/Extender: Jamie [name], Jamie [name] in Treatment: 7 Active Problems Location of Pain Severity and Description of Pain Patient Has Paino Yes Site Locations Pain Location: Pain in Ulcers Rate the pain. Current Pain Level: 10 Pain Management and Medication Current Pain Management: Electronic Signature(s) Signed: 07/10/2022 4:50:23 PM By: Jamie [name] Entered By: Jamie [name] on 07/10/2022 10:29:37 -------------------------------------------------------------------------------- Patient/Caregiver Education Details Patient Name: Date of Service: Jamie [name] 4/3/2024andnbsp10:15 A M Medical Record Number: [id number] Patient Account Number: [id number] Date of Birth/Gender: Treating RN: [birth year] (82 y.o. Jamie [name] Primary Care Physician: Jamie [name] Other Clinician: Referring Physician: Treating Physician/Extender: Jamie [name], Roseanne Reno in Treatment: 7 Education Assessment Education Provided To: Patient Education Topics Provided Wound/Skin Impairment: Handouts: Caring for Your Ulcer Methods: Explain/Verbal Responses: Reinforcements needed Jamie, [name] ([id number]) [id number] Page 4 of 5 Electronic Signature(s) Signed: 07/10/2022 4:55:19 PM By: Jamie Pilling RN, BSN Entered By: Jamie [name] on 07/10/2022 11:04:59 -------------------------------------------------------------------------------- Wound Assessment Details Patient Name: Date of Service: Jamie [name]. 07/10/2022 10:15 A M Medical Record Number: [id number] Patient Account Number: [id number] Date of Birth/Sex: Treating RN: 25-Mar-1941 (82 y.o. F) Primary Care Kwali Wrinkle: Jamie [name] Other Clinician: Referring  Gracyn Allor: Treating Lynnix Schoneman/Extender: Jamie [name], Jamie [name] in Treatment: 7 Wound Status Wound Number: 5 Primary Venous Leg Ulcer Etiology: Wound Location: Right, Lateral Lower Leg Wound Open Wounding Event: Trauma Status: Date Acquired: 04/17/2022 Comorbid Sleep Apnea, Hypertension, Peripheral Venous Disease, [name] Of Treatment: 7 History: Osteoarthritis, Neuropathy Clustered Wound: No Photos Wound Measurements Length: (cm) 12 Width: (cm) 6 Depth: (cm) 0.1 Area: (cm) 56.549 Volume: (cm) 5.655 % Reduction in Area: -116.1% % Reduction in Volume: 28% Epithelialization: None Tunneling: No Undermining: No Wound Description Classification: Full Thickness Without Exposed Support Wound Margin: Distinct, outline attached Exudate Amount: Medium Exudate Type: Serosanguineous Exudate Color: red, brown Structures Foul Odor After Cleansing: No Slough/Fibrino Yes Wound Bed Granulation Amount: Medium (34-66%) Exposed Structure Granulation Quality: Red, Friable Fascia Exposed: No Necrotic Amount: Medium (34-66%) Fat Layer (Subcutaneous Tissue) Exposed: Yes Necrotic Quality: Adherent Slough Tendon Exposed: No Muscle Exposed: No Joint Exposed: No Bone Exposed: No Periwound Skin Texture Texture Color No Abnormalities Noted: No No Abnormalities Noted: No Callus: No Atrophie Blanche: No Crepitus: No Cyanosis: No Excoriation: No Ecchymosis: No Induration: No Erythema: No Rash: No Hemosiderin Staining: Yes Scarring: Yes Mottled: No Pallor: No Moisture Rubor: No No Abnormalities Noted: No Dry / Scaly: Yes Temperature / Pain Jamie, ARBELAEZ ([id number]) [id number] Page 5 of 5 Maceration: No Tenderness on Palpation: Yes Treatment Notes Wound #5 (Lower Leg) Wound Laterality: Right, Lateral Cleanser Soap and Water Discharge Instruction:  May shower and wash wound with dial antibacterial soap and water prior to  dressing change. Peri-Wound Care Sween Lotion (Moisturizing lotion) Discharge Instruction: Apply moisturizing lotion as directed Topical Primary Dressing Hydrofera Blue Ready Transfer Foam, 4x5 (in/in) Discharge Instruction: Apply to wound bed as instructed Secondary Dressing Zetuvit Plus 4x8 in Discharge Instruction: Apply over primary dressing as directed. Secured With [employer], 4.5x3.1 (in/yd) Discharge Instruction: Secure with Kerlix as directed. 52M Medipore H Soft Cloth Surgical T ape, 4 x 10 (in/yd) Discharge Instruction: Secure with tape as directed. Compression Wrap Tubigrip Size E Discharge Instruction: applied one layer to right leg. Compression Stockings Circaid Juxta Lite Compression Wrap Quantity: 1 Right Leg Compression Amount: 20-30 mmHg Discharge Instruction: Apply Circaid Juxta Lite Compression Wrap daily as instructed. Apply first thing in the morning, remove at night before bed. Add-Ons Electronic Signature(s) Signed: 07/10/2022 4:50:23 PM By: Jamie [name] Entered By: Jamie [name] on 07/10/2022 10:33:11 -------------------------------------------------------------------------------- Vitals Details Patient Name: Date of Service: Jamie Everts, [name] 07/10/2022 10:15 A M Medical Record Number: [id number] Patient Account Number: [id number] Date of Birth/Sex: Treating RN: 1940/08/20 (82 y.o. F) Primary Care Maleyah Evans: Jamie [name] Other Clinician: Referring Rye Decoste: Treating Plez Belton/Extender: Jamie [name], Jamie [name] in Treatment: 7 Vital Signs Time Taken: 10:28 Temperature (F): 98.4 Height (in): 62 Pulse (bpm): 76 Weight (lbs): 171 Respiratory Rate (breaths/min): 18 Body Mass Index (BMI): 31.3 Blood Pressure (mmHg): 155/91 Reference Range: 80 - 120 mg / dl Electronic Signature(s) Signed: 07/10/2022 4:50:23 PM By: Jamie [name] Entered By: Jamie [name] on 07/10/2022 10:29:01

## 2022-07-10 NOTE — Progress Notes (Signed)
TYRESA, VOORHEES ([id number]) [id number] Page 1 of 8 Visit Report for 07/03/2022 Arrival Information Details Patient Name: Date of Service: [name], [place]. 07/03/2022 10:15 A M Medical Record Number: [id number] Patient Account Number: [id number] Date of Birth/Sex: Treating RN: 1941-02-23 (82 y.o. Tonita Phoenix, Lauren Primary Care Mitsuko Luera: Ozella Rocks Other Clinician: Referring Landri Dorsainvil: Treating Lekesha Claw/Extender: Alfredo Martinez, [name] in Treatment: 6 Visit Information History Since Last Visit Added or deleted any medications: No Patient Arrived: Wheel Chair Any new allergies or adverse reactions: No Arrival Time: 09:59 Had a fall or experienced change in No Accompanied By: daughter activities of daily living that may affect Transfer Assistance: Manual risk of falls: Patient Identification Verified: Yes Signs or symptoms of abuse/neglect since last visito No Secondary Verification Process Completed: Yes Hospitalized since last visit: No Patient Requires Transmission-Based Precautions: No Implantable device outside of the clinic excluding No Patient Has Alerts: No cellular tissue based products placed in the center since last visit: Has Dressing in Place as Prescribed: Yes Has Compression in Place as Prescribed: Yes Pain Present Now: No Electronic Signature(s) Signed: 07/10/2022 4:26:45 PM By: Rhae Hammock RN Entered By: Rhae Hammock on 07/03/2022 09:59:39 -------------------------------------------------------------------------------- Clinic Level of Care Assessment Details Patient Name: Date of Service: [name]. 07/03/2022 10:15 A M Medical Record Number: [id number] Patient Account Number: [id number] Date of Birth/Sex: Treating RN: 18-Mar-1941 (81 y.o. Tonita Phoenix, Lauren Primary Care Jya Hughston: Ozella Rocks Other Clinician: Referring Aubree Doody: Treating  Dan Scearce/Extender: Alfredo Martinez, [name] in Treatment: 6 Clinic Level of Care Assessment Items TOOL 4 Quantity Score X- 1 0 Use when only an EandM is performed on FOLLOW-UP visit ASSESSMENTS - Nursing Assessment / Reassessment X- 1 10 Reassessment of Co-morbidities (includes updates in patient status) X- 1 5 Reassessment of Adherence to Treatment Plan ASSESSMENTS - Wound and Skin A ssessment / Reassessment []  - 0 Simple Wound Assessment / Reassessment - one wound X- 2 5 Complex Wound Assessment / Reassessment - multiple wounds []  - 0 Dermatologic / Skin Assessment (not related to wound area) ASSESSMENTS - Focused Assessment X- 1 5 Circumferential Edema Measurements - multi extremities []  - 0 Nutritional Assessment / Counseling / Intervention []  - 0 Lower Extremity Assessment (monofilament, tuning fork, pulses) []  - 0 Peripheral Arterial Disease Assessment (using hand held doppler) ASSESSMENTS - Ostomy and/or Continence Assessment and Care []  - 0 Incontinence Assessment and Management []  - 0 Ostomy Care Assessment and Management (repouching, etc.) PROCESS - Coordination of Care GRACEMARIE, YONKE ([id number]) [id number] Page 2 of 8 []  - 0 Simple Patient / Family Education for ongoing care X- 1 20 Complex (extensive) Patient / Family Education for ongoing care X- 1 10 Staff obtains Programmer, systems, Records, T Results / Process Orders est X- 1 10 Staff telephones HHA, Nursing Homes / Clarify orders / etc []  - 0 Routine Transfer to another Facility (non-emergent condition) []  - 0 Routine Hospital Admission (non-emergent condition) []  - 0 New Admissions / Biomedical engineer / Ordering NPWT Apligraf, etc. , []  - 0 Emergency Hospital Admission (emergent condition) []  - 0 Simple Discharge Coordination X- 1 15 Complex (extensive) Discharge Coordination PROCESS - Special Needs []  - 0 Pediatric / Minor Patient  Management []  - 0 Isolation Patient Management []  - 0 Hearing / Language / Visual special needs []  - 0 Assessment of Community assistance (transportation, D/C planning, etc.) []  - 0 Additional assistance / Altered mentation []  - 0 Support Surface(s) Assessment (bed, cushion, seat, etc.)  INTERVENTIONS - Wound Cleansing / Measurement X - Simple Wound Cleansing - one wound 1 5 []  - 0 Complex Wound Cleansing - multiple wounds X- 1 5 Wound Imaging (photographs - any number of wounds) []  - 0 Wound Tracing (instead of photographs) X- 1 5 Simple Wound Measurement - one wound []  - 0 Complex Wound Measurement - multiple wounds INTERVENTIONS - Wound Dressings []  - 0 Small Wound Dressing one or multiple wounds X- 1 15 Medium Wound Dressing one or multiple wounds []  - 0 Large Wound Dressing one or multiple wounds X- 1 5 Application of Medications - topical []  - 0 Application of Medications - injection INTERVENTIONS - Miscellaneous []  - 0 External ear exam X- 1 5 Specimen Collection (cultures, biopsies, blood, body fluids, etc.) X- 1 5 Specimen(s) / Culture(s) sent or taken to Lab for analysis []  - 0 Patient Transfer (multiple staff / Civil Service fast streamer / Similar devices) []  - 0 Simple Staple / Suture removal (25 or less) []  - 0 Complex Staple / Suture removal (26 or more) []  - 0 Hypo / Hyperglycemic Management (close monitor of Blood Glucose) []  - 0 Ankle / Brachial Index (ABI) - do not check if billed separately X- 1 5 Vital Signs Has the patient been seen at the hospital within the last three years: Yes Total Score: 135 Level Of Care: New/Established - Level 4 Electronic Signature(s) Signed: 07/10/2022 4:26:45 PM By: Rhae Hammock RN Chryl Heck ([id number]) [id number] Page 3 of 8 Entered By: Rhae Hammock on 07/03/2022 11:38:08 -------------------------------------------------------------------------------- Encounter Discharge  Information Details Patient Name: Date of Service: [name], [place]. 07/03/2022 10:15 A M Medical Record Number: [id number] Patient Account Number: [id number] Date of Birth/Sex: Treating RN: 25-May-1940 (82 y.o. Tonita Phoenix, Lauren Primary Care Mina Carlisi: Ozella Rocks Other Clinician: Referring Rahmon Heigl: Treating Jakaiya Netherland/Extender: Alfredo Martinez, Roseanne Reno in Treatment: 6 Encounter Discharge Information Items Discharge Condition: Stable Ambulatory Status: Wheelchair Discharge Destination: Home Transportation: Private Auto Accompanied By: daughter Schedule Follow-up Appointment: Yes Clinical Summary of Care: Patient Declined Electronic Signature(s) Signed: 07/10/2022 4:26:45 PM By: Rhae Hammock RN Entered By: Rhae Hammock on 07/03/2022 11:38:47 -------------------------------------------------------------------------------- Lower Extremity Assessment Details Patient Name: Date of Service: [name]. 07/03/2022 10:15 A M Medical Record Number: [id number] Patient Account Number: [id number] Date of Birth/Sex: Treating RN: [birth year] (82 y.o. Tonita Phoenix, Lauren Primary Care Camyah Pultz: Ozella Rocks Other Clinician: Referring Rashied Corallo: Treating Mariah Harn/Extender: Alfredo Martinez, [name] in Treatment: 6 Edema Assessment Assessed: [Left: Yes] [Right: Yes] Edema: [Left: Yes] [Right: Yes] Calf Left: Right: Point of Measurement: From Medial Instep 35.5 cm 36.4 cm Ankle Left: Right: Point of Measurement: From Medial Instep 22 cm 24.3 cm Vascular Assessment Pulses: Dorsalis Pedis Palpable: [Left:Yes] [Right:Yes] Posterior Tibial Palpable: [Left:Yes] [Right:Yes] Electronic Signature(s) Signed: 07/10/2022 4:26:45 PM By: Rhae Hammock RN Entered By: Rhae Hammock on 07/03/2022 09:59:56 -------------------------------------------------------------------------------- [place] Details Patient Name: Date of Service: [name], [name] 07/03/2022 10:15 A M Medical Record Number: [id number] Patient Account Number: [id number] Date of Birth/Sex: Treating RN: 08-20-1940 (45 y.o. 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 Compression as ordered Provide education on venous insufficiency Notes: Wound/Skin Impairment Nursing Diagnoses: Impaired tissue integrity Knowledge deficit related to ulceration/compromised skin integrity Goals: Patient/caregiver will verbalize understanding of skin care regimen Date Initiated: 05/22/2022 Target Resolution Date: 07/06/2022 Goal Status: Active Ulcer/skin breakdown will have a volume reduction of 30% by week 4 Date Initiated: 05/22/2022 Target Resolution Date: 07/06/2022 Goal Status: Active Interventions: Assess patient/caregiver ability to obtain necessary supplies Assess patient/caregiver ability to perform ulcer/skin care regimen upon admission and as needed Assess ulceration(s) every visit Provide education on ulcer and skin care Notes: Electronic Signature(s) Signed: 07/10/2022 4:26:45 PM By: Rhae Hammock RN Entered By: Rhae Hammock on  07/03/2022 10:38:13 -------------------------------------------------------------------------------- Pain Assessment Details Patient Name: Date of Service: [name] 07/03/2022 10:15 A M Medical Record Number: [id number] Patient Account Number: [id number] Date of Birth/Sex: Treating RN: May 16, 1940 (82 y.o. Benjaman Lobe Primary Care Fredricka Kohrs: Ozella Rocks Other Clinician: Referring Dakayla Disanti: Treating Maanya Hippert/Extender: Alfredo Martinez, [name] in Treatment: 6 Active Problems Location of Pain Severity and Description of Pain Patient Has Paino No Site Locations [place], [place] ([id number]) [id number] Page 5 of 8 Pain Management and Medication Current Pain Management: Electronic Signature(s) Signed: 07/10/2022 4:26:45 PM By: Rhae Hammock RN Entered By: Rhae Hammock on 07/03/2022 09:59:45 -------------------------------------------------------------------------------- Patient/Caregiver Education Details Patient Name: Date of Service: [name] 3/27/2024andnbsp10:15 [place] Record Number: [id number] Patient Account Number: [id number] Date of Birth/Gender: Treating RN: 1941-02-22 (82 y.o. Benjaman Lobe Primary Care Physician: Ozella Rocks Other Clinician: Referring Physician: Treating Physician/Extender: Alfredo Martinez, Roseanne Reno in Treatment: 6 Education Assessment Education Provided To: Patient and Caregiver Education Topics Provided Wound/Skin Impairment: Methods: Explain/Verbal Responses: Reinforcements needed, State content correctly Electronic Signature(s) Signed: 07/10/2022 4:26:45 PM By: Rhae Hammock RN Entered By: Rhae Hammock on 07/03/2022 10:38:26 -------------------------------------------------------------------------------- Wound Assessment Details Patient Name: Date of Service: [name].  07/03/2022 10:15 A M Medical Record Number: [id number] Patient Account Number: [id number] Date of Birth/Sex: Treating RN: 1940-06-05 (82 y.o. Tonita Phoenix, Lauren Primary Care Tonna Palazzi: Ozella Rocks Other Clinician: Referring Malekai Markwood: Treating Roberto Hlavaty/Extender: Alfredo Martinez, [name] in Treatment: 6 Wound Status Wound Number: 5 Primary Venous Leg Ulcer Etiology: Wound Location: Right, Lateral Lower Leg Wound Open Wounding Event: Trauma Status: Date Acquired: 04/17/2022 Comorbid Sleep Apnea, Hypertension, Peripheral Venous Disease, Weeks Of Treatment: 6 History: Osteoarthritis, Neuropathy Clustered Wound: No LAGINA, HOVER ([id number]) [id number] Page 6 of 8 Photos Wound Measurements Length: (cm) 12 Width: (cm) 5.4 Depth: (cm) 0.2 Area: (cm) 50.894 Volume: (cm) 10.179 % Reduction in Area: -94.5% % Reduction in Volume: -29.7% Epithelialization: None Tunneling: No Undermining: No Wound Description Classification: Full Thickness Without Exposed Support Wound Margin: Distinct, outline attached Exudate Amount: Medium Exudate Type: Serosanguineous Exudate Color: red, brown Structures Foul Odor After Cleansing: No Slough/Fibrino Yes Wound Bed Granulation Amount: Large (67-100%) Exposed Structure Granulation Quality: Red, Friable Fascia Exposed: No Necrotic Amount: Small (1-33%) Fat Layer (Subcutaneous Tissue) Exposed: Yes Necrotic Quality: Adherent Slough Tendon Exposed: No Muscle Exposed: No Joint Exposed: No Bone Exposed: No Periwound Skin Texture Texture Color No Abnormalities Noted: No No Abnormalities Noted: No Callus: No Atrophie Blanche: No Crepitus: No Cyanosis: No Excoriation: No Ecchymosis: No Induration: No Erythema: No Rash: No Hemosiderin Staining: Yes Scarring: Yes Mottled: No Pallor: No Moisture  Rubor: No No Abnormalities Noted: No Dry / Scaly: No Temperature /  Pain Maceration: No Tenderness on Palpation: Yes Electronic Signature(s) Signed: 07/10/2022 4:26:45 PM By: Rhae Hammock RN Entered By: Rhae Hammock on 07/03/2022 10:19:35 -------------------------------------------------------------------------------- Wound Assessment Details Patient Name: Date of Service: [name], [name] 07/03/2022 10:15 A M Medical Record Number: [id number] Patient Account Number: [id number] Date of Birth/Sex: Treating RN: [birth year] (82 y.o. Benjaman Lobe Primary Care Nielle Duford: Ozella Rocks Other Clinician: Referring Marigny Borre: Treating Lynessa Almanzar/Extender: Alfredo Martinez, [name] in Treatment: 6 Wound Status Wound Number: 6 Primary Venous Leg Ulcer Etiology: Wound Location: Left, Anterior Lower Leg Wound Open Wounding Event: Shear/Friction Status: Date Acquired: 05/22/2022 KIMMYA, BRACKENS ([id number]) [phone].pdf Page 7 of 8 Date Acquired: 05/22/2022 Comorbid Sleep Apnea, Hypertension, Peripheral Venous Disease, Weeks Of Treatment: 5 History: Osteoarthritis, Neuropathy Clustered Wound: No Photos Wound Measurements Length: (cm) Width: (cm) Depth: (cm) Area: (cm) Volume: (cm) 0 % Reduction in Area: 100% 0 % Reduction in Volume: 100% 0 Epithelialization: Small (1-33%) 0 0 Wound Description Classification: Partial Thickness Wound Margin: Distinct, outline attached Exudate Amount: Medium Exudate Type: Serosanguineous Exudate Color: red, brown Foul Odor After Cleansing: No Slough/Fibrino Yes Wound Bed Granulation Amount: Small (1-33%) Exposed Structure Granulation Quality: Pink Fat Layer (Subcutaneous Tissue) Exposed: Yes Necrotic Amount: Medium (34-66%) Periwound Skin Texture Texture Color No Abnormalities Noted: No No Abnormalities Noted: No Callus: No Atrophie Blanche: No Crepitus: No Cyanosis: No Excoriation: No Ecchymosis: No Induration:  No Erythema: No Rash: No Hemosiderin Staining: No Scarring: No Mottled: No Pallor: No Moisture Rubor: No No Abnormalities Noted: No Dry / Scaly: No Maceration: No Electronic Signature(s) Signed: 07/10/2022 4:26:45 PM By: Rhae Hammock RN Entered By: Rhae Hammock on 07/03/2022 10:19:55 -------------------------------------------------------------------------------- Vitals Details Patient Name: Date of Service: [name], [place] [name] 07/03/2022 10:15 A M Medical Record Number: [id number] Patient Account Number: [id number] Date of Birth/Sex: Treating RN: 05-22-1940 (82 y.o. Tonita Phoenix, Lauren Primary Care Ipek Westra: Ozella Rocks Other Clinician: Referring Stephanye Finnicum: Treating Ayelet Gruenewald/Extender: Alfredo Martinez, [name] in Treatment: 6 Vital Signs Time Taken: 10:04 Temperature (F): 98.7 Height (in): 62 Pulse (bpm): 69 Weight (lbs): 171 Respiratory Rate (breaths/min): 17 Body Mass Index (BMI): 31.3 Blood Pressure (mmHg): 109/67 GENESE, HIBBETT ([id number]) [id number] Page 8 of 8 Reference Range: 80 - 120 mg / dl Electronic Signature(s) Signed: 07/10/2022 4:26:45 PM By: Rhae Hammock RN Entered By: Rhae Hammock on 07/03/2022 10:04:56

## 2022-07-17 ENCOUNTER — Encounter (HOSPITAL_BASED_OUTPATIENT_CLINIC_OR_DEPARTMENT_OTHER): Payer: Medicare HMO | Admitting: Physician Assistant

## 2022-07-17 DIAGNOSIS — L97812 Non-pressure chronic ulcer of other part of right lower leg with fat layer exposed: Secondary | ICD-10-CM | POA: Diagnosis not present

## 2022-07-17 NOTE — Progress Notes (Signed)
Jamie [name], Jamie [name] ([id number]) [id number] Page 1 of 9 Visit Report for 07/17/2022 Chief Complaint Document Details Patient Name: Date of Service: Jamie BrinkWILSO N, [place] RG[place] [name]. 07/17/2022 10:15 A M Medical Record Number: [id number] Patient Account Number: [id number] Date of Birth/Sex: Treating RN: 04/22/1940 (82 y.o. F) Primary Care Provider: Delorise JacksonEggleston-Clark, [name] Other Clinician: Referring Provider: Treating Provider/Extender: Cristy HiltsStone III, Mattheus Rauls Eggleston-Clark, [name] in Treatment: 8 Information Obtained from: Patient Chief Complaint Bilateral LE Ulcers Electronic Signature(s) Signed: 07/17/2022 11:11:09 AM By: Allen DerryStone, Alixis Harmon PA-C Entered By: Allen DerryStone, Yamir Carignan on 07/17/2022 11:11:08 -------------------------------------------------------------------------------- Debridement Details Patient Name: Date of Service: Jamie BrinkWILSO N, Jamie GaveMA RGUERITE [name]. 07/17/2022 10:15 A M Medical Record Number: [id number] Patient Account Number: [id number] Date of Birth/Sex: Treating RN: 04/30/1940 (82 y.o. Jamie [name]) Deaton, Yvonne KendallBobbi Primary Care Provider: Delorise JacksonEggleston-Clark, [name] Other Clinician: Referring Provider: Treating Provider/Extender: Cristy HiltsStone III, Trayce Maino Eggleston-Clark, [name] in Treatment: 8 Debridement Performed for Assessment: Wound #5 Right,Lateral Lower Leg Performed By: Physician Lenda KelpStone III, Kiara Keep, PA Debridement Type: Debridement Severity of Tissue Pre Debridement: Fat layer exposed Level of Consciousness (Pre-procedure): Awake and Alert Pre-procedure Verification/Time Out Yes - 11:15 Taken: Start Time: 11:16 Pain Control: Lidocaine 5% topical ointment T Area Debrided (L x [name]): otal 5 (cm) x 3 (cm) = 15 (cm) Tissue and other material debrided: Viable, Non-Viable, Slough, Subcutaneous, Skin: Dermis , Skin: Epidermis, Slough Level: Skin/Subcutaneous Tissue Debridement Description: Excisional Instrument: Curette Bleeding: Minimum Hemostasis Achieved: Pressure End Time:  11:23 Procedural Pain: 0 Post Procedural Pain: 0 Response to Treatment: Procedure was tolerated well Level of Consciousness (Post- Awake and Alert procedure): Post Debridement Measurements of Total Wound Length: (cm) 11.5 Width: (cm) 5.5 Depth: (cm) 0.2 Volume: (cm) 9.935 Character of Wound/Ulcer Post Debridement: Requires Further Debridement Severity of Tissue Post Debridement: Fat layer exposed Post Procedure Diagnosis Same as Pre-procedure Electronic Signature(s) Signed: 07/17/2022 4:16:30 PM By: Shawn Stalleaton, Bobbi RN, BSN Signed: 07/17/2022 5:51:12 PM By: Allen DerryStone, Joahan Swatzell PA-C Entered By: Shawn Stalleaton, Bobbi on 07/17/2022 11:23:38 Jamie [name], Jamie [name] ([id number]) [id number] Page 2 of 9 -------------------------------------------------------------------------------- HPI Details Patient Name: Date of Service: Jamie BrinkWILSO N, [place] RG[place] [name]. 07/17/2022 10:15 A M Medical Record Number: [id number] Patient Account Number: [id number] Date of Birth/Sex: Treating RN: 09/30/1940 (82 y.o. F) Primary Care Provider: Delorise JacksonEggleston-Clark, [name] Other Clinician: Referring Provider: Treating Provider/Extender: Cristy HiltsStone III, Tayla Panozzo Eggleston-Clark, [name] in Treatment: 8 History of Present Illness HPI Description: ADMISSION 06/30/2020 [name] is an 82 year old woman who lives in [place]. She is here with her niece for review of wounds on the left medial lower leg and ankle. These have apparently been present for over a year and she followed with Dr. Olegario [facility]ierKathy at the [facility] in [place] for quite a period of time although it looks as though there was a initial consult wound from Dr. Marcha Soldersathey on February 18 presumably there was therefore hiatus. At that point the wounds were described as being there for 3 months. She also tells me she was at the wound care center in [place] for a period of time with this. 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 She had venous studies that were negative for DVT ABIs on the right were 1.01 on the left 1.06. She has . had previous applications of puraply, compression which she does not tolerate very well. She has had several rounds of oral antibiotic therapy. She complains of unrelenting pain and she is seeing Dr. Reece AgarEspy V of pain management apparently was on oxycodone but that did not help.  She also had a skin biopsy done by Dr. Olegario Messier although we do not have that result. She does not appear to have an arterial issue. I am not completely clear what she has been putting on the wounds lately. 3/31; this patient has a particularly nasty set of wounds on the left medial ankle in the middle of what looks to be hemosiderin deposition secondary to chronic venous insufficiency. She has a lot of pain followed by pain management. Dr. Marcha Solders apparently did a biopsy of something on the left leg last fall what I would like to get this result. She has a history of MRSA treatment. I do not believe she had reflux studies but she did have DVT rule out studies. Previous ABIs have not suggested arterial insufficiency 4/7; difficult wounds on the left medial ankle probably chronic venous insufficiency. With considerable effort on behalf of our case manager we were able to finally to speak to somebody at the hospital in [place] who indicated that no biopsy of this area have been done even though the patient describes this in some detail and is even able to point out where she thinks the biopsy was done. We have been using Sorbact. The PCR culture I did showed polymicrobial identification with Pseudomonas, staph aureus, Peptostreptococcus. All of this and low titers. Resistance genes identified were MRSA, staph virulence gene and tetracycline. We are going to send this to [facility] for a topical antibiotic which is something that we have had good success with recently in large venous ulcers with a lot of purulent  drainage. There would not be an easy oral alternative here possibly line escalated and ciprofloxacin if we need to use systemic antibiotic 4/15; difficult area on the left medial ankle. Most likely chronic venous insufficiency. I think she will probably need venous reflux study I think she had DVT rule outs but not venous reflux studies. We have not yet obtained the topical antibiotics. She has home health changing the dressing we have been using Sorbact for adherent fibrinous debris on the surface. Very difficult to remove 4/22; patient presents for 1 week follow-up. She has been using sore back under compression wraps and these are changed 3 times a week with home health. She also had [facility] antibiotics sent to her house and brought them in today. She has no complaints or issues today. 5/2; patient is here for follow-up. She has been using Sorbact under compression. Very painful wound. She has been using [facility] antibiotics. Not much improvement although the more medial part of the wound has cleaned up nicely and the larger part of the wound about 50% slough covered. Part of the issue here is that she had stays at both [facility] wound care center, [facility] wound care center and now [place]. Not sure if she has had venous reflux studies. As far as we are able to tell she did not have a biopsy. My notes state that she did not have venous reflux studies just DVT rule outs. 5/16; patient goes for venous reflux studies this afternoon. She says that wound was biopsied which sounds like punch biopsies by Dr. Lynden Ang we do not have these results. We are using Sorbact. Very difficult wound to debride 5/23; patient presents for 1 week follow-up. She reports tolerating the wraps well with sorbact underneath. She had ABIs and venous reflux studies done. She has no issues or complaints today. She denies acute signs of infection. 6/6; I have reviewed the patient's vascular studies. Wounds are on the left  medial and  posterior calf. She had significant reflux in the greater saphenous vein in the in the mid thigh, distal thigh knee and the small saphenous vein in the popliteal fossa. The vein diameters do not look too impressive though. She is going to see the vascular surgeon on Wednesday. She also had venous reflux in the right common femoral vein. She did not have any evidence of a DVT or SVT I am wondering whether there is an ablation procedure that would benefit her in the greater saphenous vein on the left. . She tells [place] that home health put the dressing on too tight and she took off 1 layer. The swelling in her left leg is a little worse as a result of this. Not much change in the wound measurements so the surface of the wound looks better Her arterial studies showed an ABI on the right of 1.14 with a triphasic waveform and a great toe pressure of 0.89. On the left her ABIs were noncompressible at 1.34 but with triphasic waveforms and a TBI of 0.97. Her greater toe pressure was 122. There was no evidence of significant bilateral arterial disease 6/20 patient went to see Dr. Durwin Nora. He did not think she had significant arterial disease. In terms of her venous duplex on the right side there was no evidence of a DVT or SVT there was deep venous reflux involving the common femoral vein no superficial vein reflux on the left side there was no DVT or SVT there was no deep vein graft reflux there was some reflux in the greater saphenous vein from the mid thigh to the knee but the vein here was not dilated. He thought these were venous wounds he prescribed a compression pump but I am not sure who we ordered it from The patient has been approved for Apligraf. Still using silver collagen this week 7/5; Apligraf #1 7/19 Apligraf #2. Decent improvement in the condition of the wound bed. Epithelialization distal 8/2 Apligraf #3. No issues or complaints. Denies signs of infection. 8/17 Apligraf #4. No issues or  concerns. Some complaints of pruritus and the rash. Our intake nurse brought up the fact that she had previously indicated possible cotton layer sensitivity we will therefore use kerlix in the bottom layer of the compression 8/30; the patient comes in with the area on her left medial leg just about healed. There is a superficial area more towards the tibia and a smaller open area distally everything else is epithelialized. I do not think she requires another Apligraf 9/13; left medial leg is healed. 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 Her edema control is good Readmisstion: 05-22-2022 upon evaluation today patient presents for initial inspection here in our clinic concerning issues that she has been having with a wound of the right lateral lower extremity. This is an area of a previous skin graft she tells me. With that being said she unfortunately had a scrape on this that occurred around 10 January. Since that time she has noted that this has just continued to get bigger and bigger in her niece who is present with her today actually states that 2 MARNESHA, GAGEN ([id number]) [id number] Page 3 of 9 weeks ago when she saw that it was significantly smaller than what she sees currently. Obviously this is of utmost concern as we do not want this to continue to get larger when arrested and get moving in the right direction. Fortunately  there does not appear to be any signs of systemic infection though locally I think we probably do have some infection present. I would obtain a culture to see what we have going on here and then we will subsequently see where things go going forward. Fortunately I think that she is in the right place at this point being at the wound center we can definitely do something to try to get this moving in the right direction. Patient does have a history of chronic venous  insufficiency as well as coronary artery disease. In the past she has done well with compression wraps on the start with a 3 layer wrap I think she is probably can end up going to a 4-layer at some point but we will see how things do over the next week. She has been using wound cleanser along with she tells me a zinc and topical but again I am not sure exactly what that was. 05-29-2022 upon evaluation today patient appears to be doing well currently in regard to her wound. This actually is showing signs of improvement I am happy in that regard unfortunately her left leg has an area on the shin that has opened. This is the region where one of the areas at least we have previously taken care of. Nonetheless this is small hoping we get it under control before things worsen significantly. 06-05-2022 upon evaluation today patient appears to be doing well currently in regard to her wounds. The actually seem to be fairly clean she is having still quite a bit of problem with pain at this point she would like to not have debridement today for all possible. With that being said I do believe that we are making some good progress I think the [facility] is doing a decent job here. 3/6; patient has had 3/6; patient with known severe chronic venous insufficiency. She apparently had a traumatic wound at home and has a reasonably substantial wound on the right lateral lower leg and a smaller one on the left anterior lower leg as well. We have been using Hydrofera Blue and Unna boots 3/13; patient presents for follow-up. We have been using Hydrofera Blue under Coflex to the legs bilaterally. She has no issues or complaints today. 06-26-2022 upon evaluation today patient appears to be doing well currently in regard to her wound. This is measuring a little bit larger however. Fortunately I do not see any signs of infection this is on the right side. Fortunately the left side is actually completely healed which is great  news. 07-03-2022 patient's wound unfortunately is continuing to show signs of being worse. Her compression which was the Tubigrip that I thought should be able to pull up actually slipped down and she was not able to pull that back up. With that being said that means that unfortunately her wound has continued to deteriorate since I last saw her. This is definitely not the direction that we are looking for. I discussed with her that I do believe we need to go ahead and see about getting her started on antibiotics and I subsequently would also like to go ahead and see about getting things moving forward with regard to a wound culture and making a change up her dressings as well but this can be changed more frequently than just once a week. 07-10-2022 upon evaluation today patient actually appears to be doing much better. I do believe that the antibiotics have been beneficial for her. Fortunately I do not  see any signs of active infection locally nor systemically which is great news. No fevers, chills, nausea, vomiting, or diarrhea. 07-17-2022 upon evaluation today patient appears to be doing well currently in regard to her wound which is actually showing signs of improvement this is slow but nonetheless prevalent that we are seeing good improvement here. Fortunately I do not see any signs of active infection locally nor systemically which is great news. Electronic Signature(s) Signed: 07/17/2022 4:55:25 PM By: Allen Derry PA-C Entered By: Allen Derry on 07/17/2022 16:55:24 -------------------------------------------------------------------------------- Physical Exam Details Patient Name: Date of Service: [name] 07/17/2022 10:15 A M Medical Record Number: [id number] Patient Account Number: [id number] Date of Birth/Sex: Treating RN: Mar 11, 1941 (82 y.o. F) Primary Care Provider: [name] Other Clinician: Referring Provider: Treating Provider/Extender: [name], [name] in Treatment: 8 Constitutional Well-nourished and well-hydrated in no acute distress. Respiratory normal breathing without difficulty. Psychiatric this patient is able to make decisions and demonstrates good insight into disease process. Alert and Oriented x 3. pleasant and cooperative. Notes Upon inspection patient's wound bed actually showed signs of good granulation and epithelization at this point. Fortunately I think that her wound is doing better I think the antibiotics did well and I think that we are on the right track. With that being said it still can take some time to get this completely closed and she voiced understanding nonetheless I think we are moving in the right direction. Electronic Signature(s) Signed: 07/17/2022 4:55:50 PM By: Allen Derry PA-C Entered By: Allen Derry on 07/17/2022 16:55:50 -------------------------------------------------------------------------------- Physician Orders Details Patient Name: Date of Service: Jamie [name], Jamie Gave [name]. 07/17/2022 10:15 A M Medical Record Number: [id number] Patient Account Number: [id number] MICHAELE, AMUNDSON ([id number]) [phone].pdf Page 4 of 9 Date of Birth/Sex: Treating RN: 1941-01-02 (82 y.o. 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 - Junious Silk [facility] and Bobbi Rm # 8 Wednesday 07/24/22 @ 10:15 one Return A ppointment in 2 weeks. - [name]/ Leonard Schwartz [facility] Bobbi Rm # 8 Wednesday 07/31/22 @ 10:15 one Return A Other: - [facility] to start Tuesday 07/23/2022 Anesthetic Wound #5 Right,Lateral Lower Leg (In clinic) Topical Lidocaine 4% applied to wound bed Bathing/ Shower/ Hygiene May shower with protection but do not get wound dressing(s) wet. Protect dressing(s) with water repellant cover (for example, large plastic bag) or a cast cover and may then take shower. - may purchase cast protector from [facility], [facility] or [employer] Edema Control - Lymphedema / SCD / Other Right Lower Extremity Elevate legs to the level of the heart or above for 30 minutes daily and/or when sitting for 3-4 times a day throughout the day. Avoid standing for long periods of time. Exercise regularly Moisturize legs daily. - Apply to left leg every night before bedtime. Home Health Admit to Home Health for skilled nursing wound care. May utilize formulary equivalent dressing for wound treatment orders unless otherwise specified. - T be changed Monday and Friday by  Home Health, Wednesday by Wound Care Center o New wound care orders this week; continue Home Health for wound care. May utilize formulary equivalent dressing for wound treatment orders unless otherwise specified. Other Home Health Orders/Instructions: - [facility] Wound Treatment Wound #5 - Lower Leg Wound Laterality: Right, Lateral Cleanser: Soap and Water 3 x Per Week/30 Days Discharge Instructions: May shower and wash wound with dial antibacterial soap and water prior to dressing change. Peri-Wound Care: Sween Lotion (Moisturizing lotion) 3 x Per Week/30 Days Discharge Instructions: Apply moisturizing lotion as directed Prim Dressing: Hydrofera Blue Ready Transfer Foam, 4x5 (in/in) (Generic) 3 x Per Week/30 Days ary Discharge Instructions: Apply  to wound bed as instructed Secondary Dressing: Zetuvit Plus 4x8 in (Generic) 3 x Per Week/30 Days Discharge Instructions: Apply over primary dressing as directed. Secured With: [employer], 4.5x3.1 (in/yd) (Generic) 3 x Per Week/30 Days Discharge Instructions: Secure with Kerlix as directed. Secured With: 19M Medipore H Soft Cloth Surgical T ape, 4 x 10 (in/yd) (Generic) 3 x Per Week/30 Days Discharge Instructions: Secure with tape as directed. Compression Wrap: Tubigrip Size E (Generic) 3 x Per Week/30 Days Discharge Instructions: applied one layer to right leg. Electronic Signature(s) Signed: 07/17/2022 4:16:30 PM By: Shawn Stall RN, BSN Signed: 07/17/2022 5:51:12 PM By: Allen Derry PA-C Entered By: Shawn Stall on 07/17/2022 11:22:20 Jamie Nickel ([id number]) [id number] Page 5 of 9 -------------------------------------------------------------------------------- Problem List Details Patient Name: Date of Service: Jamie [name], [name]. 07/17/2022 10:15 A M Medical Record Number: [id number] Patient Account Number: [id number] Date of Birth/Sex: Treating RN: Oct 19, 1940 (82 y.o. F) Primary Care Provider: [name] Other Clinician: Referring Provider: Treating Provider/Extender: [name], [name] in Treatment: 8 Active Problems ICD-10 Encounter Code Description Active Date MDM Diagnosis I87.331 Chronic venous hypertension (idiopathic) with ulcer and inflammation of right 05/22/2022 No Yes lower extremity L97.812 Non-pressure chronic ulcer of other part of right lower leg with fat layer 05/22/2022 No Yes exposed L97.822 Non-pressure chronic ulcer of other part of left lower leg with fat layer exposed2/21/2024 No Yes I25.10 Atherosclerotic heart disease of native coronary artery without angina pectoris 05/22/2022 No Yes Inactive Problems Resolved Problems Electronic Signature(s) Signed: 07/17/2022  11:10:56 AM By: Allen Derry PA-C Entered By: Allen Derry on 07/17/2022 11:10:55 -------------------------------------------------------------------------------- Progress Note Details Patient Name: Date of Service: Gevena Mart [name]. 07/17/2022 10:15 A M Medical Record Number: [id number] Patient Account Number: [id number] Date of Birth/Sex: Treating RN: [birth year] (82 y.o. F) Primary Care Provider: [name] Other Clinician: Referring Provider: Treating Provider/Extender: [name], [name] in Treatment: 8 Subjective Chief Complaint Information obtained from Patient Bilateral LE Ulcers History of Present Illness (HPI) ADMISSION 06/30/2020 Mrs. Leitz is an 82 year old woman who lives in [place]. She is here with her niece for review of wounds on the left medial lower leg and ankle. These have apparently been present for over a year and she followed with Dr. Olegario Messier at the [facility] in [place] for quite a period of time although it looks as though there was a initial consult wound from Dr. Marcha Solders on February 18 presumably there was therefore hiatus. At that point the wounds were described as being there for 3 months. She also tells me she was at the wound care center in [place] for a period of time with this. 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 She had venous studies that were negative for DVT ABIs  on the right were 1.01 on the left 1.06. She has . had previous applications of puraply, compression which she does not tolerate very well. She has had several rounds of oral antibiotic therapy. She complains of unrelenting pain and she is seeing Dr. Reece Agar V of pain management apparently was on oxycodone but that did not help. She also had a skin biopsy done by Dr. Olegario Messier although we do not have that result. She does not appear to have an arterial issue. I am not completely clear what  she has been putting on the wounds lately. 3/31; this patient has a particularly nasty set of wounds on the left medial ankle in the middle of what looks to be hemosiderin deposition secondary to chronic venous insufficiency. She has a lot of pain followed by pain management. Dr. Marcha Solders apparently did a biopsy of something on the left leg last fall what I JENETTA, WEASE ([id number]) [id number] Page 6 of 9 would like to get this result. She has a history of MRSA treatment. I do not believe she had reflux studies but she did have DVT rule out studies. Previous ABIs have not suggested arterial insufficiency 4/7; difficult wounds on the left medial ankle probably chronic venous insufficiency. With considerable effort on behalf of our case manager we were able to finally to speak to somebody at the hospital in [place] who indicated that no biopsy of this area have been done even though the patient describes this in some detail and is even able to point out where she thinks the biopsy was done. We have been using Sorbact. The PCR culture I did showed polymicrobial identification with Pseudomonas, staph aureus, Peptostreptococcus. All of this and low titers. Resistance genes identified were MRSA, staph virulence gene and tetracycline. We are going to send this to [facility] for a topical antibiotic which is something that we have had good success with recently in large venous ulcers with a lot of purulent drainage. There would not be an easy oral alternative here possibly line escalated and ciprofloxacin if we need to use systemic antibiotic 4/15; difficult area on the left medial ankle. Most likely chronic venous insufficiency. I think she will probably need venous reflux study I think she had DVT rule outs but not venous reflux studies. We have not yet obtained the topical antibiotics. She has home health changing the dressing we have been using Sorbact for adherent fibrinous  debris on the surface. Very difficult to remove 4/22; patient presents for 1 week follow-up. She has been using sore back under compression wraps and these are changed 3 times a week with home health. She also had [facility] antibiotics sent to her house and brought them in today. She has no complaints or issues today. 5/2; patient is here for follow-up. She has been using Sorbact under compression. Very painful wound. She has been using [facility] antibiotics. Not much improvement although the more medial part of the wound has cleaned up nicely and the larger part of the wound about 50% slough covered. Part of the issue here is that she had stays at both [facility] wound care center, [facility] wound care center and now [place]. Not sure if she has had venous reflux studies. As far as we are able to tell she did not have a biopsy. My notes state that she did not have venous reflux studies just DVT rule outs. 5/16; patient goes for venous reflux studies this afternoon. She says that wound was biopsied which sounds like punch biopsies  by Dr. Lynden Ang we do not have these results. We are using Sorbact. Very difficult wound to debride 5/23; patient presents for 1 week follow-up. She reports tolerating the wraps well with sorbact underneath. She had ABIs and venous reflux studies done. She has no issues or complaints today. She denies acute signs of infection. 6/6; I have reviewed the patient's vascular studies. Wounds are on the left medial and posterior calf. She had significant reflux in the greater saphenous vein in the in the mid thigh, distal thigh knee and the small saphenous vein in the popliteal fossa. The vein diameters do not look too impressive though. She is going to see the vascular surgeon on Wednesday. She also had venous reflux in the right common femoral vein. She did not have any evidence of a DVT or SVT I am wondering whether there is an ablation procedure that would benefit her in the greater  saphenous vein on the left. . She tells [place] that home health put the dressing on too tight and she took off 1 layer. The swelling in her left leg is a little worse as a result of this. Not much change in the wound measurements so the surface of the wound looks better Her arterial studies showed an ABI on the right of 1.14 with a triphasic waveform and a great toe pressure of 0.89. On the left her ABIs were noncompressible at 1.34 but with triphasic waveforms and a TBI of 0.97. Her greater toe pressure was 122. There was no evidence of significant bilateral arterial disease 6/20 patient went to see Dr. Durwin Nora. He did not think she had significant arterial disease. In terms of her venous duplex on the right side there was no evidence of a DVT or SVT there was deep venous reflux involving the common femoral vein no superficial vein reflux on the left side there was no DVT or SVT there was no deep vein graft reflux there was some reflux in the greater saphenous vein from the mid thigh to the knee but the vein here was not dilated. He thought these were venous wounds he prescribed a compression pump but I am not sure who we ordered it from The patient has been approved for Apligraf. Still using silver collagen this week 7/5; Apligraf #1 7/19 Apligraf #2. Decent improvement in the condition of the wound bed. Epithelialization distal 8/2 Apligraf #3. No issues or complaints. Denies signs of infection. 8/17 Apligraf #4. No issues or concerns. Some complaints of pruritus and the rash. Our intake nurse brought up the fact that she had previously indicated possible cotton layer sensitivity we will therefore use kerlix in the bottom layer of the compression 8/30; the patient comes in with the area on her left medial leg just about healed. There is a superficial area more towards the tibia and a smaller open area distally everything else is epithelialized. I do not think she requires another Apligraf 9/13; left  medial leg is healed. 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 Her edema control is good Readmisstion: 05-22-2022 upon evaluation today patient presents for initial inspection here in our clinic concerning issues that she has been having with a wound of the right lateral lower extremity. This is an area of a previous skin graft she tells me. With that being said she unfortunately had a scrape on this that occurred around 10 January. Since that time she has noted that this has just continued to  get bigger and bigger in her niece who is present with her today actually states that 2 weeks ago when she saw that it was significantly smaller than what she sees currently. Obviously this is of utmost concern as we do not want this to continue to get larger when arrested and get moving in the right direction. Fortunately there does not appear to be any signs of systemic infection though locally I think we probably do have some infection present. I would obtain a culture to see what we have going on here and then we will subsequently see where things go going forward. Fortunately I think that she is in the right place at this point being at the wound center we can definitely do something to try to get this moving in the right direction. Patient does have a history of chronic venous insufficiency as well as coronary artery disease. In the past she has done well with compression wraps on the start with a 3 layer wrap I think she is probably can end up going to a 4-layer at some point but we will see how things do over the next week. She has been using wound cleanser along with she tells me a zinc and topical but again I am not sure exactly what that was. 05-29-2022 upon evaluation today patient appears to be doing well currently in regard to her wound. This actually is showing signs of improvement I am happy in that regard unfortunately her left leg has  an area on the shin that has opened. This is the region where one of the areas at least we have previously taken care of. Nonetheless this is small hoping we get it under control before things worsen significantly. 06-05-2022 upon evaluation today patient appears to be doing well currently in regard to her wounds. The actually seem to be fairly clean she is having still quite a bit of problem with pain at this point she would like to not have debridement today for all possible. With that being said I do believe that we are making some good progress I think the [facility] is doing a decent job here. 3/6; patient has had 3/6; patient with known severe chronic venous insufficiency. She apparently had a traumatic wound at home and has a reasonably substantial wound on the right lateral lower leg and a smaller one on the left anterior lower leg as well. We have been using Hydrofera Blue and Unna boots 3/13; patient presents for follow-up. We have been using Hydrofera Blue under Coflex to the legs bilaterally. She has no issues or complaints today. 06-26-2022 upon evaluation today patient appears to be doing well currently in regard to her wound. This is measuring a little bit larger however. Fortunately I do not see any signs of infection this is on the right side. Fortunately the left side is actually completely healed which is great news. 07-03-2022 patient's wound unfortunately is continuing to show signs of being worse. Her compression which was the Tubigrip that I thought should be able to pull up actually slipped down and she was not able to pull that back up. With that being said that means that unfortunately her wound has continued to deteriorate since I last saw her. This is definitely not the direction that we are looking for. I discussed with her that I do believe we need to go ahead and see about getting her started on antibiotics and I subsequently would also like to go ahead and see about  getting things moving forward with regard to a wound culture and making a change up her dressings as well but this can be changed more frequently than just once a week. 07-10-2022 upon evaluation today patient actually appears to be doing much better. I do believe that the antibiotics have been beneficial for her. Fortunately I do not see any signs of active infection locally nor systemically which is great news. No fevers, chills, nausea, vomiting, or diarrhea. ZARETH, RIPPETOE ([id number]) [id number] Page 7 of 9 07-17-2022 upon evaluation today patient appears to be doing well currently in regard to her wound which is actually showing signs of improvement this is slow but nonetheless prevalent that we are seeing good improvement here. Fortunately I do not see any signs of active infection locally nor systemically which is great news. Objective Constitutional Well-nourished and well-hydrated in no acute distress. Vitals Time Taken: 10:46 AM, Height: 62 in, Weight: 171 lbs, BMI: 31.3, Temperature: 98.3 F, Pulse: 56 bpm, Respiratory Rate: 18 breaths/min, Blood Pressure: 124/73 mmHg. Respiratory normal breathing without difficulty. Psychiatric this patient is able to make decisions and demonstrates good insight into disease process. Alert and Oriented x 3. pleasant and cooperative. General Notes: Upon inspection patient's wound bed actually showed signs of good granulation and epithelization at this point. Fortunately I think that her wound is doing better I think the antibiotics did well and I think that we are on the right track. With that being said it still can take some time to get this completely closed and she voiced understanding nonetheless I think we are moving in the right direction. Integumentary (Hair, Skin) Wound #5 status is Open. Original cause of wound was Trauma. The date acquired was: 04/17/2022. The wound has been in treatment 8 weeks. The wound  is located on the Right,Lateral Lower Leg. The wound measures 11.5cm length x 5.5cm width x 0.1cm depth; 49.676cm^2 area and 4.968cm^3 volume. There is Fat Layer (Subcutaneous Tissue) exposed. There is no tunneling or undermining noted. There is a medium amount of serosanguineous drainage noted. The wound margin is distinct with the outline attached to the wound base. There is large (67-100%) red granulation within the wound bed. There is a small (1-33%) amount of necrotic tissue within the wound bed including Adherent Slough. The periwound skin appearance exhibited: Scarring, Dry/Scaly, Hemosiderin Staining. The periwound skin appearance did not exhibit: Callus, Crepitus, Excoriation, Induration, Rash, Maceration, Atrophie Blanche, Cyanosis, Ecchymosis, Mottled, Pallor, Rubor, Erythema. The periwound has tenderness on palpation. 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 There was a Excisional Skin/Subcutaneous Tissue Debridement with a total area of 15 sq cm performed by Lenda Kelp, PA. With the following instrument(s): Curette to remove Viable and Non-Viable tissue/material. Material removed includes Subcutaneous Tissue, Slough, Skin: Dermis, and Skin: Epidermis after achieving pain control using Lidocaine 5% topical ointment. A time out was conducted at 11:15, prior to the start of the procedure. A Minimum amount of bleeding was controlled with Pressure. The procedure was tolerated well with  a pain level of 0 throughout and a pain  level of 0 following the procedure. Post Debridement Measurements: 11.5cm length x 5.5cm width x 0.2cm depth; 9.935cm^3 volume. Character of Wound/Ulcer Post Debridement requires further debridement. Severity of Tissue Post Debridement is: Fat layer exposed. Post procedure Diagnosis Wound #5: Same as Pre-Procedure Plan Follow-up Appointments: Return Appointment in 1 week. - [name]/ [facility] 8 Wednesday 07/24/22 @ 10:15 one Return Appointment in 2 weeks. - Junious Silk [facility] Wednesday 07/31/22 @ 10:15 one Other: - [facility] to start Tuesday 07/23/2022 Anesthetic: Wound #5 Right,Lateral Lower Leg: (In clinic) Topical Lidocaine 4% applied to wound bed Bathing/ Shower/ Hygiene: May shower with protection but do not get wound dressing(s) wet. Protect dressing(s) with water repellant cover (for example, large plastic bag) or a cast cover LALITA, TOH ([id number]) [id number] Page 8 of 9 and may then take shower. - may purchase cast protector from [facility], [facility] or [employer] Edema Control - Lymphedema / SCD / Other: Elevate legs to the level of the heart or above for 30 minutes daily and/or when sitting for 3-4 times a day throughout the day. Avoid standing for long periods of time. Exercise regularly Moisturize legs daily. - Apply to left leg every night before bedtime. Home Health: Admit to Home Health for skilled nursing wound care. May utilize formulary equivalent dressing for wound treatment orders unless otherwise specified. - T be o changed Monday and Friday by Home Health, Wednesday by Wound Care Center New wound care orders this week; continue Home Health for wound care. May utilize formulary equivalent dressing for wound treatment orders unless otherwise specified. Other Home Health Orders/Instructions: - [facility] WOUND #5: - Lower Leg Wound Laterality: Right,  Lateral Cleanser: Soap and Water 3 x Per Week/30 Days Discharge Instructions: May shower and wash wound with dial antibacterial soap and water prior to dressing change. Peri-Wound Care: Sween Lotion (Moisturizing lotion) 3 x Per Week/30 Days Discharge Instructions: Apply moisturizing lotion as directed Prim Dressing: Hydrofera Blue Ready Transfer Foam, 4x5 (in/in) (Generic) 3 x Per Week/30 Days ary Discharge Instructions: Apply to wound bed as instructed Secondary Dressing: Zetuvit Plus 4x8 in (Generic) 3 x Per Week/30 Days Discharge Instructions: Apply over primary dressing as directed. Secured With: [employer], 4.5x3.1 (in/yd) (Generic) 3 x Per Week/30 Days Discharge Instructions: Secure with Kerlix as directed. Secured With: 31M Medipore H Soft Cloth Surgical T ape, 4 x 10 (in/yd) (Generic) 3 x Per Week/30 Days Discharge Instructions: Secure with tape as directed. Com pression Wrap: Tubigrip Size E (Generic) 3 x Per Week/30 Days Discharge Instructions: applied one layer to right leg. 1. I would recommend currently that we have the patient continue to monitor for any evidence of infection or worsening. She will specifically continue with the dressing with Hydrofera Blue along with Zetuvit and then were using the Tubigrip for the time being. I will say that we want to go back to her compression wrap right now and not quite there. 2. I am also can recommend the patient should continue to monitor for any evidence of infection overall. If anything changes she knows to contact the office and let me know in general however I do believe that we are making some pretty good progress here and I am hopeful she will continue to show good progress we will keep a close eye out for infection. We will see patient back for reevaluation in 1 week here in the clinic. If anything worsens or changes patient  will contact our office for additional recommendations. Electronic Signature(s) Signed:  07/17/2022 4:56:42 PM By: Allen Derry PA-C Entered By: Allen Derry on 07/17/2022 16:56:42 -------------------------------------------------------------------------------- SuperBill Details Patient Name: Date of Service: Jamie [name], Christean Grief. 07/17/2022 Medical Record Number: [id number] Patient Account Number: [id number] Date of Birth/Sex: Treating RN: 01/31/1941 (82 y.o. Jamie Pickett, Yvonne Kendall Primary Care Provider: [name] Other Clinician: Referring Provider: Treating Provider/Extender: [name], [name] in Treatment: 8 Diagnosis Coding ICD-10 Codes Code Description [phone] Chronic venous hypertension (idiopathic) with ulcer and inflammation of right lower extremity 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d L97.822 Non-pressure chronic ulcer of other part of left lower leg with fat layer exposed I25.10 Atherosclerotic heart disease of native coronary artery without angina pectoris Facility Procedures : CPT4 Code: [id number] Description: 11042 - DEB SUBQ TISSUE 20 SQ CM/< ICD-10 Diagnosis Description L97.812 Non-pressure chronic ulcer of other part of right lower leg with fat layer expo Modifier: sed Quantity: 1 Physician Procedures : CPT4 Code Description Modifier [id number] 11042 - WC PHYS SUBQ TISS 20 SQ CM ICD-10 Diagnosis Description 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d JOSELYNNE, KILLAM ([id number])  [phone].pdf Page 9 of 9 Quantity: 1 Electronic Signature(s) Signed: 07/17/2022 4:58:44 PM By: Allen Derry PA-C Previous Signature: 07/17/2022 4:16:30 PM Version By: Shawn Stall RN, BSN Entered By: Allen Derry on 07/17/2022 16:58:44

## 2022-07-19 NOTE — Progress Notes (Signed)
[name], Jamie ([id number]) [id number] Page 1 of 5 Visit Report for 07/17/2022 Arrival Information Details Patient Name: Date of Service: Jamie [name], [name]. 07/17/2022 10:15 A M Medical Record Number: [id number] Patient Account Number: [id number] Date of Birth/Sex: Treating RN: 12-02-1940 (82 y.o. F) Primary Care Laiklynn Raczynski: [name] Other Clinician: Referring Ilisa Hayworth: Treating Ziaire Bieser/Extender: [name], [name] in Treatment: 8 Visit Information History Since Last Visit Added or deleted any medications: No Patient Arrived: Wheel Chair Any new allergies or adverse reactions: No Arrival Time: 10:44 Had a fall or experienced change in No Accompanied By: Niece activities of daily living that may affect Transfer Assistance: None risk of falls: Patient Identification Verified: Yes Signs or symptoms of abuse/neglect since last visito No Secondary Verification Process Completed: Yes Hospitalized since last visit: No Patient Requires Transmission-Based Precautions: No Implantable device outside of the clinic excluding No Patient Has Alerts: No cellular tissue based products placed in the center since last visit: Has Dressing in Place as Prescribed: Yes Has Compression in Place as Prescribed: Yes Pain Present Now: Yes Electronic Signature(s) Signed: 07/19/2022 12:27:54 PM By: Thayer Dallas Entered By: Thayer Dallas on 07/17/2022 10:45:09 -------------------------------------------------------------------------------- Encounter Discharge Information Details Patient Name: Date of Service: Jamie [name], [name] 07/17/2022 10:15 A M Medical Record Number: [id number] Patient Account Number: [id number] Date of Birth/Sex: Treating RN: 1940-10-21 (82 y.o. [name] Primary Care Brice Kossman: [name] Other Clinician: Referring [name]: Treating Ryun Velez/Extender: [name], [name] in Treatment: 8 Encounter Discharge Information Items Post Procedure Vitals Discharge Condition: Stable Temperature (F): 98.3 Ambulatory Status: Wheelchair Pulse (bpm): 56 Discharge Destination: Home Respiratory Rate (breaths/min): 18 Transportation: Private Auto Blood Pressure (mmHg): 124/73 Accompanied By: niece Schedule Follow-up Appointment: Yes Clinical Summary of Care: Electronic Signature(s) Signed: 07/17/2022 4:16:30 PM By: Shawn Stall RN, BSN Entered By: Shawn Stall on 07/17/2022 11:24:39 -------------------------------------------------------------------------------- Lower Extremity Assessment Details Patient Name: Date of Service: Jamie [name] W. 07/17/2022 10:15 A M Medical Record Number: [id number] Patient Account Number: [id number] Date of Birth/Sex: Treating RN: Oct 24, 1940 (82 y.o. F) Primary Care Shanin Szymanowski: [name] Other Clinician: Referring Kiren Mcisaac: Treating Elic Vencill/Extender: [name], [name] in Treatment: 8 Edema Assessment W[LeftAldean Baker ([id number])] Franne Forts: [id number] Page 2 of 5] Assessed: [Left: No] [Right: No] Edema: [Left: Ye] [Right: s] Calf Left: Right: Point of Measurement: From Medial Instep 37 cm Ankle Left: Right: Point of Measurement: From Medial Instep 26 cm Electronic Signature(s) Signed: 07/19/2022 12:27:54 PM By: Thayer Dallas Entered By: Thayer Dallas on 07/17/2022 10:56:35 -------------------------------------------------------------------------------- Multi-Disciplinary Care Plan Details Patient Name: Date of Service: Jamie [name], [name] 07/17/2022 10:15 A M Medical Record Number: [id number] Patient Account Number: [id number] Date of Birth/Sex: Treating RN: [birth year] (82 y.o. 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 Compression as ordered Provide education on venous insufficiency Notes: Wound/Skin Impairment Nursing Diagnoses: Impaired tissue integrity Knowledge deficit related to ulceration/compromised skin integrity Goals: Patient/caregiver will verbalize understanding of skin care regimen Date Initiated:  05/22/2022 Target Resolution Date: 08/09/2022 Goal Status: Active Ulcer/skin breakdown will have a volume reduction of 30% by week 4 Date Initiated: 05/22/2022 Date Inactivated: 07/10/2022 Target Resolution Date: 07/06/2022 Unmet Reason: see wound Goal Status: Unmet measurement. Interventions: Assess patient/caregiver ability to obtain necessary supplies Assess patient/caregiver ability to perform ulcer/skin care regimen upon admission and as needed Assess ulceration(s) every visit Provide education on ulcer and skin care Notes: RANETTE, LUCKADOO ([id number]) [phone].pdf Page 3 of 5 Electronic Signature(s) Signed: 07/17/2022 4:16:30 PM By: Shawn Stall RN, BSN Entered By: Shawn Stall on 07/17/2022 11:18:06 -------------------------------------------------------------------------------- Pain Assessment Details Patient Name: Date of Service: Jamie [name]. 07/17/2022 10:15 A M Medical Record Number: [id number] Patient Account Number:  [id number] Date of Birth/Sex: Treating RN: 1941/03/20 (82 y.o. F) Primary Care Felis Quillin: [name] Other Clinician: Referring Marixa Mellott: Treating Holdan Stucke/Extender: [name], [name] in Treatment: 8 Active Problems Location of Pain Severity and Description of Pain Patient Has Paino Yes Site Locations Pain Location: Pain in Ulcers Rate the pain. Current Pain Level: 8 Pain Management and Medication Current Pain Management: Electronic Signature(s) Signed: 07/19/2022 12:27:54 PM By: Thayer Dallas Entered By: Thayer Dallas on 07/17/2022 10:47:27 -------------------------------------------------------------------------------- Patient/Caregiver Education Details Patient Name: Date of Service: Jamie [name] 4/10/2024andnbsp10:15 A M Medical Record Number: [id number] Patient Account Number: [id number] Date of Birth/Gender: Treating RN: 1940-07-23 (82 y.o. [name] Primary Care Physician: [name] Other Clinician: Referring Physician: Treating Physician/Extender: [name], Billey Gosling in Treatment: 8 Education Assessment Education Provided To: Patient Education Topics Provided Wound/Skin Impairment: Handouts: Caring for Your Ulcer Methods: Explain/Verbal Responses: Reinforcements needed MARTIN, SMEAL ([id number]) [phone].pdf Page 4 of 5 Electronic Signature(s) Signed: 07/17/2022 4:16:30 PM By: Shawn Stall RN, BSN Entered By: Shawn Stall on 07/17/2022 11:18:16 -------------------------------------------------------------------------------- Wound Assessment Details Patient Name: Date of Service: Jamie [name]. 07/17/2022 10:15 A M Medical Record Number: [id number] Patient Account Number: [id number] Date of Birth/Sex: Treating RN: 1940/07/12 (82 y.o. F) Primary Care Hawkin Charo: [name] Other Clinician: Referring  Kripa Foskey: Treating Twylla Arceneaux/Extender: [name], [name] in Treatment: 8 Wound Status Wound Number: 5 Primary Venous Leg Ulcer Etiology: Wound Location: Right, Lateral Lower Leg Wound Open Wounding Event: Trauma Status: Date Acquired: 04/17/2022 Comorbid Sleep Apnea, Hypertension, Peripheral Venous Disease, Weeks Of Treatment: 8 History: Osteoarthritis, Neuropathy Clustered Wound: No Photos Wound Measurements Length: (cm) 11.5 Width: (cm) 5.5 Depth: (cm) 0.1 Area: (cm) 49.676 Volume: (cm) 4.968 % Reduction in Area: -89.8% % Reduction in Volume: 36.7% Epithelialization: None Tunneling: No Undermining: No Wound Description Classification: Full Thickness Without Exposed Support Wound Margin: Distinct, outline attached Exudate Amount: Medium Exudate Type: Serosanguineous Exudate Color: red, brown Structures Foul Odor After Cleansing: No Slough/Fibrino Yes Wound Bed Granulation Amount: Large (67-100%) Exposed Structure Granulation Quality: Red Fascia Exposed: No Necrotic Amount: Small (1-33%) Fat Layer (Subcutaneous Tissue) Exposed: Yes Necrotic Quality: Adherent Slough Tendon Exposed: No Muscle Exposed: No Joint Exposed: No Bone Exposed: No Periwound Skin Texture Texture Color No Abnormalities Noted: No No Abnormalities Noted: No Callus: No Atrophie Blanche: No Crepitus: No Cyanosis: No Excoriation: No Ecchymosis: No Induration: No Erythema: No Rash: No Hemosiderin Staining: Yes Scarring: Yes Mottled: No Pallor: No Moisture Rubor: No No Abnormalities Noted: No Dry / Scaly: Yes Temperature / Pain ABBIGAILE, ROCKMAN ([id number]) [phone].pdf Page 5 of 5 Maceration: No Tenderness on Palpation: Yes Treatment Notes Wound #5 (Lower Leg) Wound Laterality: Right, Lateral Cleanser Soap and Water Discharge Instruction: May shower  and wash wound with dial antibacterial soap and water prior to dressing  change. Peri-Wound Care Sween Lotion (Moisturizing lotion) Discharge Instruction: Apply moisturizing lotion as directed Topical Primary Dressing Hydrofera Blue Ready Transfer Foam, 4x5 (in/in) Discharge Instruction: Apply to wound bed as instructed Secondary Dressing Zetuvit Plus 4x8 in Discharge Instruction: Apply over primary dressing as directed. Secured With [employer], 4.5x3.1 (in/yd) Discharge Instruction: Secure with Kerlix as directed. 34M Medipore H Soft Cloth Surgical T ape, 4 x 10 (in/yd) Discharge Instruction: Secure with tape as directed. Compression Wrap Tubigrip Size E Discharge Instruction: applied one layer to right leg. Compression Stockings Add-Ons Electronic Signature(s) Signed: 07/19/2022 12:27:54 PM By: Thayer Dallas Entered By: Thayer Dallas on 07/17/2022 11:03:19 -------------------------------------------------------------------------------- Vitals Details Patient Name: Date of Service: Jamie Brink, MA [name] 07/17/2022 10:15 A M Medical Record Number: [id number] Patient Account Number: [id number] Date of Birth/Sex: Treating RN: 10-Jan-1941 (82 y.o. F) Primary Care Paulina Muchmore: [name] Other Clinician: Referring Chevi Lim: Treating Modell Fendrick/Extender: [name], [name] in Treatment: 8 Vital Signs Time Taken: 10:46 Temperature (F): 98.3 Height (in): 62 Pulse (bpm): 56 Weight (lbs): 171 Respiratory Rate (breaths/min): 18 Body Mass Index (BMI): 31.3 Blood Pressure (mmHg): 124/73 Reference Range: 80 - 120 mg / dl Electronic Signature(s) Signed: 07/19/2022 12:27:54 PM By: Thayer Dallas Entered By: Thayer Dallas on 07/17/2022 10:47:06

## 2022-07-24 ENCOUNTER — Encounter (HOSPITAL_BASED_OUTPATIENT_CLINIC_OR_DEPARTMENT_OTHER): Payer: Medicare HMO | Admitting: Physician Assistant

## 2022-07-24 DIAGNOSIS — L97812 Non-pressure chronic ulcer of other part of right lower leg with fat layer exposed: Secondary | ICD-10-CM | POA: Diagnosis not present

## 2022-07-24 NOTE — Progress Notes (Signed)
Jamie [name] ([id number]) [id number] Page 1 of 8 Visit Report for 07/24/2022 Chief Complaint Document Details Patient Name: Date of Service: Jamie [name], [name]. 07/24/2022 10:15 A M Medical Record Number: [id number] Patient Account Number: [id number] Date of Birth/Sex: Treating RN: Jamie [name] (82 y.o. F) Primary Care Provider: Delorise [name] Other Clinician: Referring Provider: Treating Provider/Extender: Jamie [name], Jamie [name] in Treatment: 9 Information Obtained from: Patient Chief Complaint Bilateral LE Ulcers Electronic Signature(s) Signed: 07/24/2022 10:18:41 AM By: Jamie Derry PA-C Entered By: Jamie [name] on 07/24/2022 10:18:41 -------------------------------------------------------------------------------- HPI Details Patient Name: Date of Service: Jamie [name], [name] 07/24/2022 10:15 A M Medical Record Number: [id number] Patient Account Number: [id number] Date of Birth/Sex: Treating RN: [name] (82 y.o. F) Primary Care Provider: Delorise [name] Other Clinician: Referring Provider: Treating Provider/Extender: Jamie [name], Jamie [name] in Treatment: 9 History of Present Illness HPI Description: ADMISSION 06/30/2020 Jamie [name] is an 82 year old woman who lives in [place]. She is here with her niece for review of wounds on the left medial lower leg and ankle. These have apparently been present for over a year and she followed with Jamie [name] at the [name] in [place] for quite a period of time although it looks as though there was a initial consult wound from Jamie [name] on February 18 presumably there was therefore hiatus. At that point the wounds were described as being there for 3 months. She also tells me she was at the wound care center in [place] for a period of time with this. 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 She had venous studies that were negative for DVT ABIs on the right were 1.01 on the left 1.06. She has . had previous applications of puraply, compression which she does not tolerate very well. She has had several rounds of oral antibiotic therapy. She complains of unrelenting pain and she is seeing Jamie [name] of pain management apparently was on oxycodone but that did not help. She also had a skin biopsy done by Jamie [name] although we do not have that result. She does not appear to have an arterial issue. I am not completely clear what she has been putting on the wounds lately. 3/31; this patient has a particularly nasty set of wounds on the left medial ankle in the middle of what looks to be hemosiderin deposition secondary to chronic venous insufficiency. She has a lot of pain followed by pain management. Jamie [name] apparently did a biopsy of something on the left leg last fall what I would like to get this result. She has a history of MRSA treatment. I do not believe she had reflux studies but she did have DVT rule out studies. Previous ABIs have not suggested arterial insufficiency 4/7; difficult wounds on the left medial ankle probably chronic venous insufficiency. With considerable effort on behalf of our case manager we were able to finally to speak to somebody at the [name] in [place] who indicated that no biopsy of this area have been done even though the patient describes this in some detail and is even able to point out where she thinks the biopsy was done. We have been using Sorbact. The PCR culture I did showed polymicrobial identification with Pseudomonas, staph aureus, Peptostreptococcus. All of this and low titers. Resistance genes identified were MRSA, staph virulence gene and tetracycline. We are going to send this to [name] for a topical antibiotic which is something  that we have had good success with recently in large venous ulcers with a lot of purulent  drainage. There would not be an easy oral alternative here possibly line escalated and ciprofloxacin if we need to use systemic antibiotic 4/15; difficult area on the left medial ankle. Most likely chronic venous insufficiency. I think she will probably need venous reflux study I think she had DVT rule outs but not venous reflux studies. We have not yet obtained the topical antibiotics. She has home health changing the dressing we have been using Sorbact for adherent fibrinous debris on the surface. Very difficult to remove 4/22; patient presents for 1 week follow-up. She has been using sore back under compression wraps and these are changed 3 times a week with home health. She also had Keystone antibiotics sent to her house and brought them in today. She has no complaints or issues today. 5/2; patient is here for follow-up. She has been using Sorbact under compression. Very painful wound. She has been using Keystone antibiotics. Not much improvement although the more medial part of the wound has cleaned up nicely and the larger part of the wound about 50% slough covered. Part of the issue here is that she had stays at both [facility] wound care center, [facility] wound care center and now [place]. Not sure if she has had venous reflux studies. As far as we are able to tell she did not have a biopsy. My notes state that she did not have venous reflux studies just DVT rule outs. 5/16; patient goes for venous reflux studies this afternoon. She says that wound was biopsied which sounds like punch biopsies by Jamie [name] we do not have these results. We are using Sorbact. Very difficult wound to debride 5/23; patient presents for 1 week follow-up. She reports tolerating the wraps well with sorbact underneath. She had ABIs and venous reflux studies done. She Jamie [name] ([id number]) [id number] Page 2 of 8 has no issues or complaints today. She denies acute signs of infection. 6/6;  I have reviewed the patient's vascular studies. Wounds are on the left medial and posterior calf. She had significant reflux in the greater saphenous vein in the in the mid thigh, distal thigh knee and the small saphenous vein in the popliteal fossa. The vein diameters do not look too impressive though. She is going to see the vascular surgeon on Wednesday. She also had venous reflux in the right common femoral vein. She did not have any evidence of a DVT or SVT I am wondering whether there is an ablation procedure that would benefit her in the greater saphenous vein on the left. . She tells [place] that home health put the dressing on too tight and she took off 1 layer. The swelling in her left leg is a little worse as a result of this. Not much change in the wound measurements so the surface of the wound looks better Her arterial studies showed an ABI on the right of 1.14 with a triphasic waveform and a great toe pressure of 0.89. On the left her ABIs were noncompressible at 1.34 but with triphasic waveforms and a TBI of 0.97. Her greater toe pressure was 122. There was no evidence of significant bilateral arterial disease 6/20 patient went to see Dr. Durwin Nora. He did not think she had significant arterial disease. In terms of her venous duplex on the right side there was no evidence of a DVT or SVT there was deep venous reflux involving the common  femoral vein no superficial vein reflux on the left side there was no DVT or SVT there was no deep vein graft reflux there was some reflux in the greater saphenous vein from the mid thigh to the knee but the vein here was not dilated. He thought these were venous wounds he prescribed a compression pump but I am not sure who we ordered it from The patient has been approved for Apligraf. Still using silver collagen this week 7/5; Apligraf #1 7/19 Apligraf #2. Decent improvement in the condition of the wound bed. Epithelialization distal 8/2 Apligraf #3. No issues  or complaints. Denies signs of infection. 8/17 Apligraf #4. No issues or concerns. Some complaints of pruritus and the rash. Our intake nurse brought up the fact that she had previously indicated possible cotton layer sensitivity we will therefore use kerlix in the bottom layer of the compression 8/30; the patient comes in with the area on her left medial leg just about healed. There is a superficial area more towards the tibia and a smaller open area distally everything else is epithelialized. I do not think she requires another Apligraf 9/13; left medial leg is healed. 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 Her edema control is good Readmisstion: 05-22-2022 upon evaluation today patient presents for initial inspection here in our clinic concerning issues that she has been having with a wound of the right lateral lower extremity. This is an area of a previous skin graft she tells me. With that being said she unfortunately had a scrape on this that occurred around 10 January. Since that time she has noted that this has just continued to get bigger and bigger in her niece who is present with her today actually states that 2 [name] ago when she saw that it was significantly smaller than what she sees currently. Obviously this is of utmost concern as we do not want this to continue to get larger when arrested and get moving in the right direction. Fortunately there does not appear to be any signs of systemic infection though locally I think we probably do have some infection present. I would obtain a culture to see what we have going on here and then we will subsequently see where things go going forward. Fortunately I think that she is in the right place at this point being at the wound center we can definitely do something to try to get this moving in the right direction. Patient does have a history of chronic venous insufficiency as well as  coronary artery disease. In the past she has done well with compression wraps on the start with a 3 layer wrap I think she is probably can end up going to a 4-layer at some point but we will see how things do over the next week. She has been using wound cleanser along with she tells me a zinc and topical but again I am not sure exactly what that was. 05-29-2022 upon evaluation today patient appears to be doing well currently in regard to her wound. This actually is showing signs of improvement I am happy in that regard unfortunately her left leg has an area on the shin that has opened. This is the region where one of the areas at least we have previously taken care of. Nonetheless this is small hoping we get it under control before things worsen significantly. 06-05-2022 upon evaluation today patient appears to be doing well currently in regard  to her wounds. The actually seem to be fairly clean she is having still quite a bit of problem with pain at this point she would like to not have debridement today for all possible. With that being said I do believe that we are making some good progress I think the [facility] is doing a decent job here. 3/6; patient has had 3/6; patient with known severe chronic venous insufficiency. She apparently had a traumatic wound at home and has a reasonably substantial wound on the right lateral lower leg and a smaller one on the left anterior lower leg as well. We have been using Hydrofera Blue and Unna boots 3/13; patient presents for follow-up. We have been using Hydrofera Blue under Coflex to the legs bilaterally. She has no issues or complaints today. 06-26-2022 upon evaluation today patient appears to be doing well currently in regard to her wound. This is measuring a little bit larger however. Fortunately I do not see any signs of infection this is on the right side. Fortunately the left side is actually completely healed which is great news. 07-03-2022 patient's  wound unfortunately is continuing to show signs of being worse. Her compression which was the Tubigrip that I thought should be able to pull up actually slipped down and she was not able to pull that back up. With that being said that means that unfortunately her wound has continued to deteriorate since I last saw her. This is definitely not the direction that we are looking for. I discussed with her that I do believe we need to go ahead and see about getting her started on antibiotics and I subsequently would also like to go ahead and see about getting things moving forward with regard to a wound culture and making a change up her dressings as well but this can be changed more frequently than just once a week. 07-10-2022 upon evaluation today patient actually appears to be doing much better. I do believe that the antibiotics have been beneficial for her. Fortunately I do not see any signs of active infection locally nor systemically which is great news. No fevers, chills, nausea, vomiting, or diarrhea. 07-17-2022 upon evaluation today patient appears to be doing well currently in regard to her wound which is actually showing signs of improvement this is slow but nonetheless prevalent that we are seeing good improvement here. Fortunately I do not see any signs of active infection locally nor systemically which is great news. 07-24-2022 upon evaluation today patient appears to be doing well currently in regard to her wound although the wrap that we had on has been slipping down and she really does not have anybody to help her with this at home health is not going to be coming out we could not get anybody that would actually be able to do the dressing changes. Fortunately I do not see any signs of active infection locally nor systemically which is great news. Electronic Signature(s) Signed: 07/24/2022 10:29:01 AM By: Jamie Derry PA-C Entered By: Jamie [name] on 07/24/2022  10:29:01 -------------------------------------------------------------------------------- Physical Exam Details Patient Name: Date of Service: [name] 07/24/2022 10:15 A M Medical Record Number: [id number] Patient Account Number: [id number] Date of Birth/Sex: Treating RN: [birth year][name] (82 y.o. F) Primary Care Provider: Delorise [name] Other Clinician: Referring Provider: Treating Provider/Extender: Jamie [name], [place], [place] ([id number]) [id number] Page 3 of 8 [name] in Treatment: 9 Constitutional Well-nourished and well-hydrated in no acute distress. Respiratory normal breathing without difficulty. Psychiatric  this patient is able to make decisions and demonstrates good insight into disease process. Alert and Oriented x 3. pleasant and cooperative. Notes Upon inspection patient's wound did not require sharp debridement I do believe however we need to put her in a compression wrap I do not think that she is going to be able to have this changed more frequently at home the good news is I do believe the infection is under control at this point we should be in pretty good shape as far as that is concerned. Electronic Signature(s) Signed: 07/24/2022 10:29:32 AM By: Jamie Derry PA-C Entered By: Jamie [name] on 07/24/2022 10:29:32 -------------------------------------------------------------------------------- Physician Orders Details Patient Name: Date of Service: Jamie [name], [name] 07/24/2022 10:15 A M Medical Record Number: [id number] Patient Account Number: [id number] Date of Birth/Sex: Treating RN: [name][birth year] (82 y.o. [name] Primary Care Provider: Delorise [name] Other Clinician: Referring Provider: Treating Provider/Extender: Jamie [name], Jamie [name] in Treatment: 9 Verbal / Phone Orders: No Diagnosis Coding ICD-10 Coding Code Description I87.331  Chronic venous hypertension (idiopathic) with ulcer and inflammation of right lower extremity 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d L97.822 Non-pressure chronic ulcer of other part of left lower leg with fat layer exposed I25.10 Atherosclerotic heart disease of native coronary artery without angina pectoris Follow-up Appointments ppointment in 1 week. - Junious Silk [facility] and Bobbi Rm # 8 Wednesday 07/31/22 @ 10:15 one Return A ppointment in 2 [name]. - w/ Sharie Amorin [facility] and Bobbi Rm # 8 Wednesday 08/07/22 @ 10:15 one Return A Anesthetic Wound #5 Right,Lateral Lower Leg (In clinic) Topical Lidocaine 4% applied to wound bed Bathing/ Shower/ Hygiene May shower with protection but do not get wound dressing(s) wet. Protect dressing(s) with water repellant cover (for example, large plastic bag) or a cast cover and may then take shower. - may purchase cast protector from [facility], [facility] or [employer] Edema Control - Lymphedema / SCD / Other Right Lower Extremity Elevate legs to the level of the heart or above for 30 minutes daily and/or when sitting for 3-4 times a day throughout the day. Avoid standing for long periods of time. Exercise regularly Wound Treatment Wound #5 - Lower Leg Wound Laterality: Right, Lateral Cleanser: Soap and Water 1 x Per Week/30 Days Discharge Instructions: May shower and wash wound with dial antibacterial soap and water prior to dressing change. Peri-Wound Care: Sween Lotion (Moisturizing lotion) 1 x Per Week/30 Days Discharge Instructions: Apply moisturizing lotion as directed Prim Dressing: Hydrofera Blue Ready Transfer Foam, 4x5 (in/in) (Generic) 1 x Per Week/30 Days ary Discharge Instructions: Apply to wound bed as instructed Secondary Dressing: ABD Pad, 8x10 1 x Per Week/30 Days Discharge Instructions: Apply over primary dressing as directed. CATHERINA, PATES ([id number]) [id number] Page 4 of  8 Secondary Dressing: CarboFLEX Odor Control Dressing, 4x4 in 1 x Per Week/30 Days Discharge Instructions: Apply over primary dressing as directed. Secondary Dressing: Woven Gauze Sponge, Non-Sterile 4x4 in 1 x Per Week/30 Days Discharge Instructions: Apply over primary dressing as directed. Secondary Dressing: Zetuvit Plus 4x8 in 1 x Per Week/30 Days Discharge Instructions: Apply over primary dressing as directed. Compression Wrap: Urgo K2 Lite, two layer compression system, regular 1 x Per Week/30 Days Discharge Instructions: Apply Urgo K2 Lite as directed (alternative to 3 layer compression). Electronic Signature(s) Unsigned Entered By: Shawn Stall on 07/24/2022 10:24:15 -------------------------------------------------------------------------------- Problem List Details Patient Name: Date of Service: Jamie [name], [place] W. 07/24/2022 10:15  A M Medical Record Number: [id number] Patient Account Number: [id number] Date of Birth/Sex: Treating RN: [birth year][name] (82 y.o. Debara Pickett, Jamie [name] Primary Care Provider: Delorise [name] Other Clinician: Referring Provider: Treating Provider/Extender: Jamie [name], Jamie [name] in Treatment: 9 Active Problems ICD-10 Encounter Code Description Active Date MDM Diagnosis I87.331 Chronic venous hypertension (idiopathic) with ulcer and inflammation of right 05/22/2022 No Yes lower extremity L97.812 Non-pressure chronic ulcer of other part of right lower leg with fat layer 05/22/2022 No Yes exposed L97.822 Non-pressure chronic ulcer of other part of left lower leg with fat layer exposed2/21/2024 No Yes I25.10 Atherosclerotic heart disease of native coronary artery without angina pectoris 05/22/2022 No Yes Inactive Problems Resolved Problems Electronic Signature(s) Signed: 07/24/2022 10:18:32 AM By: Jamie Derry PA-C Entered By: Jamie [name] on 07/24/2022  10:18:32 -------------------------------------------------------------------------------- Progress Note Details Patient Name: Date of Service: Jamie [name], [name] 07/24/2022 10:15 A M Medical Record Number: [id number] Patient Account Number: [id number] Date of Birth/Sex: Treating RN: [birth year] (82 y.o. F) Primary Care Provider: Delorise [name] Other Clinician: Referring Provider: Treating Provider/Extender: Jamie [name], [place], [place] ([id number]) [id number] Page 5 of 8 [name] in Treatment: 9 Subjective Chief Complaint Information obtained from Patient Bilateral LE Ulcers History of Present Illness (HPI) ADMISSION 06/30/2020 Mrs. Rens is an 83 year old woman who lives in [place]. She is here with her niece for review of wounds on the left medial lower leg and ankle. These have apparently been present for over a year and she followed with Jamie [name] at the [name] in [place] for quite a period of time although it looks as though there was a initial consult wound from Jamie [name] on February 18 presumably there was therefore hiatus. At that point the wounds were described as being there for 3 months. She also tells me she was at the wound care center in [place] for a period of time with this. 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 She had venous studies that were negative for DVT ABIs on the right were 1.01 on the left 1.06. She has . had previous applications of puraply, compression which she does not tolerate very well. She has had several rounds of oral antibiotic therapy. She complains of unrelenting pain and she is seeing Jamie [name] of pain management apparently was on oxycodone but that did not help. She also had a skin biopsy done by Jamie [name] although we do not have that result. She does not appear to have an arterial issue. I am not completely  clear what she has been putting on the wounds lately. 3/31; this patient has a particularly nasty set of wounds on the left medial ankle in the middle of what looks to be hemosiderin deposition secondary to chronic venous insufficiency. She has a lot of pain followed by pain management. Jamie [name] apparently did a biopsy of something on the left leg last fall what I would like to get this result. She has a history of MRSA treatment. I do not believe she had reflux studies but she did have DVT rule out studies. Previous ABIs have not suggested arterial insufficiency 4/7; difficult wounds on the left medial ankle probably chronic venous insufficiency. With considerable effort on behalf of our case manager we were able to finally to speak to somebody at the [name] in [place] who indicated that no biopsy of this area have been done even though the patient describes this in some detail  and is even able to point out where she thinks the biopsy was done. We have been using Sorbact. The PCR culture I did showed polymicrobial identification with Pseudomonas, staph aureus, Peptostreptococcus. All of this and low titers. Resistance genes identified were MRSA, staph virulence gene and tetracycline. We are going to send this to [facility] for a topical antibiotic which is something that we have had good success with recently in large venous ulcers with a lot of purulent drainage. There would not be an easy oral alternative here possibly line escalated and ciprofloxacin if we need to use systemic antibiotic 4/15; difficult area on the left medial ankle. Most likely chronic venous insufficiency. I think she will probably need venous reflux study I think she had DVT rule outs but not venous reflux studies. We have not yet obtained the topical antibiotics. She has home health changing the dressing we have been using Sorbact for adherent fibrinous debris on the surface. Very difficult to remove 4/22; patient presents for  1 week follow-up. She has been using sore back under compression wraps and these are changed 3 times a week with home health. She also had [facility] antibiotics sent to her house and brought them in today. She has no complaints or issues today. 5/2; patient is here for follow-up. She has been using Sorbact under compression. Very painful wound. She has been using Keystone antibiotics. Not much improvement although the more medial part of the wound has cleaned up nicely and the larger part of the wound about 50% slough covered. Part of the issue here is that she had stays at both [facility] wound care center, [name] wound care center and now [place]. Not sure if she has had venous reflux studies. As far as we are able to tell she did not have a biopsy. My notes state that she did not have venous reflux studies just DVT rule outs. 5/16; patient goes for venous reflux studies this afternoon. She says that wound was biopsied which sounds like punch biopsies by Jamie [name] we do not have these results. We are using Sorbact. Very difficult wound to debride 5/23; patient presents for 1 week follow-up. She reports tolerating the wraps well with sorbact underneath. She had ABIs and venous reflux studies done. She has no issues or complaints today. She denies acute signs of infection. 6/6; I have reviewed the patient's vascular studies. Wounds are on the left medial and posterior calf. She had significant reflux in the greater saphenous vein in the in the mid thigh, distal thigh knee and the small saphenous vein in the popliteal fossa. The vein diameters do not look too impressive though. She is going to see the vascular surgeon on Wednesday. She also had venous reflux in the right common femoral vein. She did not have any evidence of a DVT or SVT I am wondering whether there is an ablation procedure that would benefit her in the greater saphenous vein on the left. . She tells [place] that home health put the dressing on  too tight and she took off 1 layer. The swelling in her left leg is a little worse as a result of this. Not much change in the wound measurements so the surface of the wound looks better Her arterial studies showed an ABI on the right of 1.14 with a triphasic waveform and a great toe pressure of 0.89. On the left her ABIs were noncompressible at 1.34 but with triphasic waveforms and a TBI of 0.97. Her greater toe pressure  was 122. There was no evidence of significant bilateral arterial disease 6/20 patient went to see Dr. Durwin Nora. He did not think she had significant arterial disease. In terms of her venous duplex on the right side there was no evidence of a DVT or SVT there was deep venous reflux involving the common femoral vein no superficial vein reflux on the left side there was no DVT or SVT there was no deep vein graft reflux there was some reflux in the greater saphenous vein from the mid thigh to the knee but the vein here was not dilated. He thought these were venous wounds he prescribed a compression pump but I am not sure who we ordered it from The patient has been approved for Apligraf. Still using silver collagen this week 7/5; Apligraf #1 7/19 Apligraf #2. Decent improvement in the condition of the wound bed. Epithelialization distal 8/2 Apligraf #3. No issues or complaints. Denies signs of infection. 8/17 Apligraf #4. No issues or concerns. Some complaints of pruritus and the rash. Our intake nurse brought up the fact that she had previously indicated possible cotton layer sensitivity we will therefore use kerlix in the bottom layer of the compression 8/30; the patient comes in with the area on her left medial leg just about healed. There is a superficial area more towards the tibia and a smaller open area distally everything else is epithelialized. I do not think she requires another Apligraf 9/13; left medial leg is healed. She has thick areas of chronic hypertrophied skin in this  area as well as likely lipodermatosclerosis. Her edema control is good Readmisstion: 05-22-2022 upon evaluation today patient presents for initial inspection here in our clinic concerning issues that she has been having with a wound of the right lateral lower extremity. This is an area of a previous skin graft she tells me. With that being said she unfortunately had a scrape on this that occurred around 10 January. Since that time she has noted that this has just continued to get bigger and bigger in her niece who is present with her today actually states that 2 [name] ago when she saw that it was significantly smaller than what she sees currently. Obviously this is of utmost concern as we do not want this to continue to get larger when arrested and get moving in the right direction. Fortunately there does not appear to be any signs of systemic infection though locally I think we probably do have some infection present. I would obtain a culture to see what we have going on here and then we will subsequently see where things go going forward. Fortunately I think that she is in the right place at this point being at the wound center we can definitely do something to try to get this moving in the right direction. Patient does have a history of chronic venous insufficiency as well as coronary artery disease. In the past she has done well with compression wraps on the start with a 3 layer wrap I think she is probably can end up going to a 4-layer at some point but we will see how things do over the next week. She has been QUINTAVIA, ROGSTAD ([id number]) [id number] Page 6 of 8 using wound cleanser along with she tells me a zinc and topical but again I am not sure exactly what that was. 05-29-2022 upon evaluation today patient appears to be doing well currently in regard to her wound. This actually is showing signs of improvement I  am happy in that regard unfortunately her left leg  has an area on the shin that has opened. This is the region where one of the areas at least we have previously taken care of. Nonetheless this is small hoping we get it under control before things worsen significantly. 06-05-2022 upon evaluation today patient appears to be doing well currently in regard to her wounds. The actually seem to be fairly clean she is having still quite a bit of problem with pain at this point she would like to not have debridement today for all possible. With that being said I do believe that we are making some good progress I think the [facility] is doing a decent job here. 3/6; patient has had 3/6; patient with known severe chronic venous insufficiency. She apparently had a traumatic wound at home and has a reasonably substantial wound on the right lateral lower leg and a smaller one on the left anterior lower leg as well. We have been using Hydrofera Blue and Unna boots 3/13; patient presents for follow-up. We have been using Hydrofera Blue under Coflex to the legs bilaterally. She has no issues or complaints today. 06-26-2022 upon evaluation today patient appears to be doing well currently in regard to her wound. This is measuring a little bit larger however. Fortunately I do not see any signs of infection this is on the right side. Fortunately the left side is actually completely healed which is great news. 07-03-2022 patient's wound unfortunately is continuing to show signs of being worse. Her compression which was the Tubigrip that I thought should be able to pull up actually slipped down and she was not able to pull that back up. With that being said that means that unfortunately her wound has continued to deteriorate since I last saw her. This is definitely not the direction that we are looking for. I discussed with her that I do believe we need to go ahead and see about getting her started on antibiotics and I subsequently would also like to go ahead and see about  getting things moving forward with regard to a wound culture and making a change up her dressings as well but this can be changed more frequently than just once a week. 07-10-2022 upon evaluation today patient actually appears to be doing much better. I do believe that the antibiotics have been beneficial for her. Fortunately I do not see any signs of active infection locally nor systemically which is great news. No fevers, chills, nausea, vomiting, or diarrhea. 07-17-2022 upon evaluation today patient appears to be doing well currently in regard to her wound which is actually showing signs of improvement this is slow but nonetheless prevalent that we are seeing good improvement here. Fortunately I do not see any signs of active infection locally nor systemically which is great news. 07-24-2022 upon evaluation today patient appears to be doing well currently in regard to her wound although the wrap that we had on has been slipping down and she really does not have anybody to help her with this at home health is not going to be coming out we could not get anybody that would actually be able to do the dressing changes. Fortunately I do not see any signs of active infection locally nor systemically which is great news. Objective Constitutional Well-nourished and well-hydrated in no acute distress. Vitals Time Taken: 10:03 AM, Height: 62 in, Weight: 171 lbs, BMI: 31.3, Temperature: 98.6 F, Pulse: 54 bpm, Respiratory Rate: 17 breaths/min, Blood  Pressure: 155/78 mmHg. Respiratory normal breathing without difficulty. Psychiatric this patient is able to make decisions and demonstrates good insight into disease process. Alert and Oriented x 3. pleasant and cooperative. General Notes: Upon inspection patient's wound did not require sharp debridement I do believe however we need to put her in a compression wrap I do not think that she is going to be able to have this changed more frequently at home the good  news is I do believe the infection is under control at this point we should be in pretty good shape as far as that is concerned. Integumentary (Hair, Skin) Wound #5 status is Open. Original cause of wound was Trauma. The date acquired was: 04/17/2022. The wound has been in treatment 9 [name]. The wound is located on the Right,Lateral Lower Leg. The wound measures 12cm length x 5.5cm width x 0.1cm depth; 51.836cm^2 area and 5.184cm^3 volume. There is Fat Layer (Subcutaneous Tissue) exposed. There is no tunneling or undermining noted. There is a medium amount of serosanguineous drainage noted. The wound margin is distinct with the outline attached to the wound base. There is large (67-100%) red granulation within the wound bed. There is a small (1-33%) amount of necrotic tissue within the wound bed including Adherent Slough. The periwound skin appearance exhibited: Scarring, Dry/Scaly, Hemosiderin Staining. The periwound skin appearance did not exhibit: Callus, Crepitus, Excoriation, Induration, Rash, Maceration, Atrophie Blanche, Cyanosis, Ecchymosis, Mottled, Pallor, Rubor, Erythema. The periwound has tenderness on palpation. 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 There was a Three Layer Compression Therapy Procedure by Shawn Stall, RN. Post procedure Diagnosis Wound #5: Same as Pre-Procedure Plan Follow-up Appointments: Return Appointment in 1 week. - w/ Leonard Schwartz ST  and Bobbi Rm # 8 Wednesday 07/31/22 @ 10:15 one Return Appointment in 2 [name]. - w/ Antavious Spanos ST and Bobbi Rm # 8 Wednesday 08/07/22 @ 10:15 one Anesthetic: Wound #5 Right,Lateral Lower Leg: (In clinic) Topical Lidocaine 4% applied to wound bed Bathing/ Shower/ Hygiene: May shower with protection but do not get wound dressing(s) wet. Protect dressing(s) with water repellant cover (for example, large plastic bag) or a cast cover and may then take shower. - may purchase cast protector from [facility], [facility] or [employer] Edema Control - Lymphedema / SCD / Other: Elevate legs to the level of the heart or above for 30 minutes daily and/or when sitting for 3-4 times a day throughout the day. Avoid standing for long periods of time. Exercise regularly WOUND #5: - Lower Leg Wound Laterality: Right, Lateral Cleanser: Soap and Water 1 x Per Week/30 Days Discharge Instructions: May shower and wash wound with dial antibacterial soap and water prior to dressing change. Peri-Wound Care: Sween Lotion (Moisturizing lotion) 1 x Per Week/30 Days Discharge Instructions: Apply moisturizing lotion as directed Prim Dressing: Hydrofera Blue Ready Transfer Foam, 4x5 (in/in) (Generic) 1 x Per Week/30 Days ary Discharge Instructions: Apply to wound bed as instructed Secondary Dressing: ABD Pad, 8x10 1 x Per Week/30 Days Discharge Instructions: Apply over primary dressing as directed. Secondary Dressing: CarboFLEX Odor Control Dressing,  4x4 in 1 x Per Week/30 Days Discharge Instructions: Apply over primary dressing as directed. Secondary Dressing: Woven Gauze Sponge, Non-Sterile 4x4 in 1 x Per Week/30 Days Discharge Instructions: Apply over primary dressing as directed. Secondary Dressing: Zetuvit Plus 4x8 in 1 x Per Week/30 Days Discharge Instructions: Apply over primary dressing as directed. Com pression Wrap: Urgo K2 Lite, two layer compression system, regular 1 x Per Week/30 Days Discharge Instructions: Apply Urgo K2  Lite as directed (alternative to 3 layer compression). 1. I would recommend currently that we go ahead and have the patient continue to monitor for any signs of infection or worsening right now I do not think I am seeing anything that seems to be a major issue. 2. I am good recommend as well that the patient should continue to utilize the [name] and will get a be using a compression wrap which should do extremely well for her. We will see patient back for reevaluation in 1 week here in the clinic. If anything worsens or changes patient will contact our office for additional recommendations. Electronic Signature(s) Signed: 07/24/2022 10:30:57 AM By: Jamie Derry PA-C Entered By: Jamie [name] on 07/24/2022 10:30:57 -------------------------------------------------------------------------------- SuperBill Details Patient Name: Date of Service: Jamie [name], [name] 07/24/2022 Medical Record Number: [id number] Patient Account Number: [id number] Date of Birth/Sex: Treating RN: [name] (82 y.o. Debara Pickett, Jamie [name] Primary Care Provider: Delorise [name] Other Clinician: Referring Provider: Treating Provider/Extender: Jamie [name], Jamie [name] in Treatment: 9 Diagnosis Coding ICD-10 Codes Code Description [phone] Chronic venous hypertension (idiopathic) with ulcer and inflammation of right lower extremity 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d L97.822 Non-pressure chronic ulcer of other part of left lower leg with fat layer exposed I25.10 Atherosclerotic heart disease of native coronary artery without angina pectoris JAYRA, CHOYCE ([id number]) [id number] Page 8 of 8 Facility Procedures : CPT4 Code: [id number] Description: (Facility Use Only) [phone] - APPLY MULTLAY COMPRS LWR RT LEG Modifier: Quantity: 1 Physician Procedures : CPT4 Code Description Modifier [id number]  [id number] - WC PHYS LEVEL 3 - EST PT ICD-10 Diagnosis Description I87.331 Chronic venous hypertension (idiopathic) with ulcer and inflammation of right lower extremity L97.812 Non-pressure chronic ulcer of other part  of right lower leg with fat layer exposed L97.822 Non-pressure chronic ulcer of other part of left lower leg with fat layer exposed I25.10 Atherosclerotic heart disease of native coronary artery without angina pectoris Quantity: 1 Electronic Signature(s) Signed: 07/24/2022 10:31:08 AM By: Jamie Derry PA-C Entered By: Jamie [name] on 07/24/2022 10:31:08

## 2022-07-25 NOTE — Progress Notes (Signed)
Jamie [name], Jamie [name] ([id number]) [id number] Page 1 of 7 Visit Report for 06/26/2022 Arrival Information Details Patient Name: Date of Service: Jamie [name], [name]. 06/26/2022 10:15 A M Medical Record Number: [id number] Patient Account Number: [id number] Date of Birth/Sex: Treating RN: 1940-12-17 (82 y.o. Jamie [name] Primary Care Jamie [name]: Jamie [name] Other Clinician: Referring Jamie [name]: Treating Jamie [name]/Extender: Jamie [name], Jamie [name]: 5 Visit Information History Since Last Visit All ordered tests and consults were completed: Yes Patient Arrived: Wheel Chair Added or deleted any medications: No Arrival Time: 10:35 Any new allergies or adverse reactions: No Accompanied By: daughter Had a fall or experienced change in No Transfer Assistance: EasyPivot Patient Lift activities of daily living that may affect Patient Identification Verified: Yes risk of falls: Secondary Verification Process Completed: Yes Signs or symptoms of abuse/neglect since last visito No Patient Requires Transmission-Based Precautions: No Hospitalized since last visit: No Patient Has Alerts: No Implantable device outside of the clinic excluding No cellular tissue based products placed in the center since last visit: Has Dressing in Place as Prescribed: Yes Pain Present Now: Yes Electronic Signature(s) Signed: 07/25/2022 1:55:51 PM By: Brenton Grills Entered By: Brenton Grills on 06/26/2022 10:36:07 -------------------------------------------------------------------------------- Clinic Level of Care Assessment Details Patient Name: Date of Service: Jamie [name], [name]. 06/26/2022 10:15 A M Medical Record Number: [id number] Patient Account Number: [id number] Date of Birth/Sex: Treating RN: [birth year] (82 y.o. Jamie [name] Primary Care Jordi Kamm: Jamie [name] Other Clinician: Referring Jamie [name]: Treating  Jamie [name]/Extender: Jamie [name], Jamie [name]: 5 Clinic Level of Care Assessment Items TOOL 4 Quantity Score X- 1 0 Use when only an EandM is performed on FOLLOW-UP visit ASSESSMENTS - Nursing Assessment / Reassessment X- 1 10 Reassessment of Co-morbidities (includes updates in patient status) X- 1 5 Reassessment of Adherence to [name] Plan ASSESSMENTS - Wound and Skin A ssessment / Reassessment []  - 0 Simple Wound Assessment / Reassessment - one wound X- 2 5 Complex Wound Assessment / Reassessment - multiple wounds []  - 0 Dermatologic / Skin Assessment (not related to wound area) ASSESSMENTS - Focused Assessment []  - 0 Circumferential Edema Measurements - multi extremities []  - 0 Nutritional Assessment / Counseling / Intervention []  - 0 Lower Extremity Assessment (monofilament, tuning fork, pulses) []  - 0 Peripheral Arterial Disease Assessment (using hand held doppler) ASSESSMENTS - Ostomy and/or Continence Assessment and Care []  - 0 Incontinence Assessment and Management []  - 0 Ostomy Care Assessment and Management (repouching, etc.) PROCESS - Coordination of Care DANALEE, Jamie [name] ([id number]) [id number] Page 2 of 7 []  - 0 Simple Patient / Family Education for ongoing care X- 1 20 Complex (extensive) Patient / Family Education for ongoing care X- 1 10 Staff obtains Chiropractor, Records, T Results / Process Orders est []  - 0 Staff telephones HHA, Nursing Homes / Clarify orders / etc []  - 0 Routine Transfer to another Facility (non-emergent condition) []  - 0 Routine Hospital Admission (non-emergent condition) []  - 0 New Admissions / Manufacturing engineer / Ordering NPWT Apligraf, etc. , []  - 0 Emergency Hospital Admission (emergent condition) []  - 0 Simple Discharge Coordination X- 1 15 Complex (extensive) Discharge Coordination PROCESS - Special Needs []  - 0 Pediatric / Minor Patient  Management []  - 0 Isolation Patient Management []  - 0 Hearing / Language / Visual special needs []  - 0 Assessment of Community assistance (transportation, D/C planning, etc.) []  - 0 Additional assistance / Altered mentation []  - 0 Support Surface(s) Assessment (bed, cushion,  seat, etc.) INTERVENTIONS - Wound Cleansing / Measurement  - 0 Simple Wound Cleansing - one wound X- 2 5 Complex Wound Cleansing - multiple wounds  - 0 Wound Imaging (photographs - any number of wounds)  - 0 Wound Tracing (instead of photographs)  - 0 Simple Wound Measurement - one wound  - 0 Complex Wound Measurement - multiple wounds INTERVENTIONS - Wound Dressings X - Small Wound Dressing one or multiple wounds 1 10 X- 1 15 Medium Wound Dressing one or multiple wounds  - 0 Large Wound Dressing one or multiple wounds X- 1 5 Application of Medications - topical  - 0 Application of Medications - injection INTERVENTIONS - Miscellaneous  - 0 External ear exam  - 0 Specimen Collection (cultures, biopsies, blood, body fluids, etc.)  - 0 Specimen(s) / Culture(s) sent or taken to Lab for analysis  - 0 Patient Transfer (multiple staff / Nurse, adult / Similar devices)  - 0 Simple Staple / Suture removal (25 or less)  - 0 Complex Staple / Suture removal (26 or more)  - 0 Hypo / Hyperglycemic Management (close monitor of Blood Glucose)  - 0 Ankle / Brachial Index (ABI) - do not check if billed separately X- 1 5 Vital Signs Has the patient been seen at the hospital within the last three years: Yes Total Score: 115 Level Of Care: New/Established - Level 3 Electronic Signature(s) Signed: 07/25/2022 1:55:51 PM By: Edison Simon, Lysle Rubens ([id number]) [id number] Page 3 of 7 Entered By: Brenton Grills on 06/26/2022 11:09:37 -------------------------------------------------------------------------------- Encounter Discharge Information  Details Patient Name: Date of Service: Jamie [name], [place] 06/26/2022 10:15 A M Medical Record Number: [id number] Patient Account Number: [id number] Date of Birth/Sex: Treating RN: Jun 06, 1940 (82 y.o. Jamie [name] Primary Care Raymir Frommelt: Jamie [name] Other Clinician: Referring Makalynn Berwanger: Treating Breea Loncar/Extender: Jamie [name], Jamie [name] in [name]: 5 Encounter Discharge Information Items Discharge Condition: Stable Ambulatory Status: Wheelchair Discharge Destination: Home Transportation: Private Auto Accompanied By: daughter Schedule Follow-up Appointment: Yes Clinical Summary of Care: Patient Declined Electronic Signature(s) Signed: 07/25/2022 1:55:51 PM By: Brenton Grills Entered By: Brenton Grills on 06/26/2022 11:24:16 -------------------------------------------------------------------------------- Lower Extremity Assessment Details Patient Name: Date of Service: Jamie Krill. 06/26/2022 10:15 A M Medical Record Number: [id number] Patient Account Number: [id number] Date of Birth/Sex: Treating RN: 1940-12-07 (82 y.o. Jamie [name] Primary Care Lameeka Schleifer: Jamie [name] Other Clinician: Referring Reznor Ferrando: Treating Iria Jamerson/Extender: Jamie [name], Jamie [name]: 5 Edema Assessment Assessed: [Left: No] [Right: No] Edema: [Left: Yes] [Right: Yes] Calf Left: Right: Point of Measurement: From Medial Instep 35.5 cm 36.4 cm Ankle Left: Right: Point of Measurement: From Medial Instep 22 cm 24.3 cm Electronic Signature(s) Signed: 07/25/2022 1:55:51 PM By: Brenton Grills Entered By: Brenton Grills on 06/26/2022 10:48:36 -------------------------------------------------------------------------------- Multi-Disciplinary Care Plan Details Patient Name: Date of Service: Jamie [name], [name] 06/26/2022 10:15 A M Medical Record Number: [id number] Patient Account Number:  [id number] Date of Birth/Sex: Treating RN: Mar 04, 1941 (82 y.o. 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 Compression as ordered Provide education on venous insufficiency Notes: Wound/Skin Impairment Nursing Diagnoses: Impaired tissue integrity Knowledge deficit related to ulceration/compromised skin integrity Goals: Patient/caregiver will verbalize understanding of skin care regimen Date Initiated: 05/22/2022 Target Resolution Date: 07/06/2022 Goal Status: Active Ulcer/skin breakdown will have a volume reduction of 30% by week 4 Date Initiated: 05/22/2022 Target Resolution Date: 07/06/2022 Goal Status: Active Interventions: Assess patient/caregiver ability to obtain necessary supplies Assess patient/caregiver ability to perform ulcer/skin care regimen upon admission and as needed Assess ulceration(s) every visit Provide education on ulcer and skin care Notes: Electronic Signature(s) Signed: 07/25/2022 1:55:51 PM By: Brenton Grills Entered By: Brenton Grills on 06/26/2022 10:55:12 -------------------------------------------------------------------------------- Pain Assessment Details Patient Name: Date of  Service: Jamie Krill. 06/26/2022 10:15 A M Medical Record Number: [id number] Patient Account Number: [id number] Date of Birth/Sex: Treating RN: October 08, 1940 (82 y.o. Jamie [name] Primary Care Lylliana Kitamura: Jamie [name] Other Clinician: Referring Khoen Genet: Treating Ivet Guerrieri/Extender: Jamie [name], Jamie [name]: 5 Active Problems Location of Pain Severity and Description of Pain Patient Has Paino Yes Site Locations Duration of the Pain. Jamie [name], Jamie [name] ([id number]) [id number] Page 5 of 7 Duration of the Pain. Constant / Intermittento Constant Character of Pain Describe the Pain: Aching, Sharp, Stabbing Pain Management and Medication Current Pain Management: Medication: Yes Rest: Yes How does your wound impact your activities of daily livingo Sleep: Yes Notes Appt for pain clinic in April Electronic Signature(s) Signed: 07/25/2022 1:55:51 PM By: Brenton Grills Entered By: Brenton Grills on 06/26/2022 10:38:08 -------------------------------------------------------------------------------- Patient/Caregiver Education Details Patient Name: Date of Service: Jamie Krill 3/20/2024andnbsp10:15 A M Medical Record Number: [id number] Patient Account Number: [id number] Date of Birth/Gender: Treating RN: [birth year] (81 y.o. Jamie [name] Primary Care Physician: Jamie [name] Other Clinician: Referring Physician: Treating Physician/Extender: Jamie [name], Jamie [name] in [name]: 5 Education Assessment Education Provided To: Patient Education Topics Provided Wound/Skin Impairment: Handouts: Caring for Your Ulcer Methods: Explain/Verbal Responses: Reinforcements needed, State content correctly Electronic Signature(s) Signed: 07/25/2022 1:55:51 PM By: Brenton Grills Entered By: Brenton Grills on 06/26/2022  10:55:56 -------------------------------------------------------------------------------- Wound Assessment Details Patient Name: Date of Service: Jamie Krill. 06/26/2022 10:15 A M Medical Record Number: [id number] Patient Account Number: [id number] Jamie [name], Jamie [name] ([id number]) [id number] Page 6 of 7 Date of Birth/Sex: Treating RN: [birth year] (81 y.o. Jamie [name] Primary Care Emmery Seiler: Jamie [name] Other Clinician: Referring Jatavius Ellenwood: Treating Aron Inge/Extender: Jamie [name], Jamie [name]: 5 Wound Status Wound Number: 5 Primary Venous Leg Ulcer Etiology: Wound Location: Right, Lateral Lower Leg Wound Open Wounding Event: Trauma Status: Date Acquired: 04/17/2022 Comorbid Sleep Apnea, Hypertension, Peripheral Venous Disease, [name] Of [name]: 5 History: Osteoarthritis, Neuropathy Clustered Wound: No Wound Measurements Length: (cm) 9.4 Width: (cm) 4.5 Depth: (cm) 0.2 Area: (cm) 33.222 Volume: (cm) 6.644 % Reduction in Area: -27% % Reduction in Volume: 15.4% Epithelialization: None Tunneling: No Undermining: No Wound Description Classification: Full Thickness Without Exposed Suppor Wound Margin: Distinct, outline attached Exudate Amount: Medium Exudate Type: Serosanguineous Exudate Color: red, brown t Structures Foul Odor After Cleansing: No Slough/Fibrino Yes Wound Bed Granulation Amount: Large (67-100%) Exposed Structure Granulation Quality: Red, Friable Fascia Exposed: No Necrotic Amount: Small (1-33%) Fat Layer (Subcutaneous Tissue) Exposed: Yes Necrotic Quality: Adherent Slough Tendon Exposed: No Muscle Exposed: No Joint Exposed: No Bone Exposed: No Periwound Skin Texture Texture Color No Abnormalities Noted: No No Abnormalities Noted:  No Callus: No Atrophie Blanche: No Crepitus: No Cyanosis: No Excoriation: No Ecchymosis: No Induration: No Erythema:  No Rash: No Hemosiderin Staining: Yes Scarring: Yes Mottled: No Pallor: No Moisture Rubor: No No Abnormalities Noted: No Dry / Scaly: No Temperature / Pain Maceration: No Tenderness on Palpation: Yes Electronic Signature(s) Signed: 07/25/2022 1:55:51 PM By: Brenton Grills Entered By: Brenton Grills on 06/26/2022 10:51:59 -------------------------------------------------------------------------------- Wound Assessment Details Patient Name: Date of Service: Jamie Krill. 06/26/2022 10:15 A M Medical Record Number: [id number] Patient Account Number: [id number] Date of Birth/Sex: Treating RN: 08-Apr-1941 (82 y.o. Jamie [name] Primary Care Myrtle Haller: Jamie [name] Other Clinician: Referring Margarete Horace: Treating Lexandra Rettke/Extender: Jamie [name], Jamie [name]: 5 Wound Status Wound Number: 6 Primary Venous Leg Ulcer Etiology: Wound Location: Left, Anterior Lower Leg Wound Open Wounding Event: Shear/Friction Status: Date Acquired: 05/22/2022 Comorbid Sleep Apnea, Hypertension, Peripheral Venous Disease, [name] Of [name]: 4 History: Osteoarthritis, Neuropathy Clustered Wound: No SWATI, GRANBERRY ([id number]) [id number] Page 7 of 7 Wound Measurements Length: (cm) Width: (cm) Depth: (cm) Area: (cm) Volume: (cm) 0 % Reduction in Area: 100% 0 % Reduction in Volume: 100% 0 Epithelialization: Small (1-33%) 0 Tunneling: No 0 Undermining: No Wound Description Classification: Partial Thickness Wound Margin: Distinct, outline attached Exudate Amount: Medium Exudate Type: Serosanguineous Exudate Color: red, brown Foul Odor After Cleansing: No Slough/Fibrino Yes Wound Bed Granulation Amount: Small (1-33%) Exposed Structure Granulation Quality: Pink Fat Layer (Subcutaneous Tissue) Exposed: Yes Necrotic Amount: Medium (34-66%) Periwound Skin Texture Texture Color No Abnormalities Noted:  No No Abnormalities Noted: No Callus: No Atrophie Blanche: No Crepitus: No Cyanosis: No Excoriation: No Ecchymosis: No Induration: No Erythema: No Rash: No Hemosiderin Staining: No Scarring: No Mottled: No Pallor: No Moisture Rubor: No No Abnormalities Noted: No Dry / Scaly: No Maceration: No Electronic Signature(s) Signed: 07/25/2022 1:55:51 PM By: Brenton Grills Entered By: Brenton Grills on 06/26/2022 10:59:34 -------------------------------------------------------------------------------- Vitals Details Patient Name: Date of Service: Jamie [name], [name] 06/26/2022 10:15 A M Medical Record Number: [id number] Patient Account Number: [id number] Date of Birth/Sex: Treating RN: 1940-05-24 (82 y.o. Jamie [name] Primary Care Acea Yagi: Jamie [name] Other Clinician: Referring Brooke Payes: Treating Ryott Rafferty/Extender: Jamie [name], Jamie [name]: 5 Vital Signs Time Taken: 10:36 Temperature (F): 96.7 Height (in): 62 Pulse (bpm): 45 Weight (lbs): 171 Respiratory Rate (breaths/min): 18 Body Mass Index (BMI): 31.3 Blood Pressure (mmHg): 152/85 Reference Range: 80 - 120 mg / dl Electronic Signature(s) Signed: 07/25/2022 1:55:51 PM By: Brenton Grills Entered By: Brenton Grills on 06/26/2022 10:36:58

## 2022-07-26 NOTE — Progress Notes (Signed)
BETHZY, HAUCK ([id number]) [id number] Page 1 of 6 Visit Report for 07/24/2022 Arrival Information Details Patient Name: Date of Service: [name], [name]. 07/24/2022 10:15 A M Medical Record Number: [id number] Patient Account Number: [id number] Date of Birth/Sex: Treating RN: 1940-11-05 (82 y.o. [name], Lauren Primary Care Lucero Auzenne: [name] Other Clinician: Referring Luvada Salamone: Treating Quashawn Jewkes/Extender: [name], [name] in Treatment: 9 Visit Information History Since Last Visit Added or deleted any medications: No Patient Arrived: Wheel Chair Any new allergies or adverse reactions: No Arrival Time: 10:03 Had a fall or experienced change in No Accompanied By: self activities of daily living that may affect Transfer Assistance: Manual risk of falls: Patient Identification Verified: Yes Signs or symptoms of abuse/neglect since last visito No Secondary Verification Process Completed: Yes Hospitalized since last visit: No Patient Requires Transmission-Based Precautions: No Implantable device outside of the clinic excluding No Patient Has Alerts: No cellular tissue based products placed in the center since last visit: Has Dressing in Place as Prescribed: Yes Has Compression in Place as Prescribed: Yes Pain Present Now: Yes Electronic Signature(s) Signed: 07/26/2022 11:19:08 AM By: Fonnie Mu RN Entered By: Fonnie Mu on 07/24/2022 10:03:20 -------------------------------------------------------------------------------- Compression Therapy Details Patient Name: Date of Service: [name], [name] 07/24/2022 10:15 A M Medical Record Number: [id number] Patient Account Number: [id number] Date of Birth/Sex: Treating RN: 1940-09-05 (82 y.o. [name] Primary Care Javeion Cannedy: [name] Other Clinician: Referring Hazell Siwik: Treating Ronae Noell/Extender: [name], [name] in Treatment: 9 Compression Therapy Performed for Wound Assessment: Wound #5 Right,Lateral Lower Leg Performed By: Clinician Shawn Stall, RN Compression Type: Three Layer Post Procedure Diagnosis Same as Pre-procedure Electronic Signature(s) Signed: 07/25/2022 2:41:21 PM By: Shawn Stall RN, BSN Entered By: Shawn Stall on 07/24/2022 10:22:58 -------------------------------------------------------------------------------- Encounter Discharge Information Details Patient Name: Date of Service: [name], [name] 07/24/2022 10:15 A M Medical Record Number: [id number] Patient Account Number: [id number] Date of Birth/Sex: Treating RN: 1940-06-03 (82 y.o. [name] Primary Care Amauri Keefe: [name] Other Clinician: Referring Bryann Gentz: Treating Ambre Kobayashi/Extender: [name], Billey Gosling in Treatment: 9 Encounter Discharge Information Items Discharge Condition: Stable Ambulatory Status: Wheelchair Discharge Destination: Home Transportation: [address] JAMIELYNN, WIGLEY ([id number]) [id number] Page 2 of 6 Accompanied By: self Schedule Follow-up Appointment: Yes Clinical Summary of Care: Electronic Signature(s) Signed: 07/25/2022 2:41:21 PM By: Shawn Stall RN, BSN Entered By: Shawn Stall on 07/24/2022 10:24:56 -------------------------------------------------------------------------------- Lower Extremity Assessment Details Patient Name: Date of Service: Gevena Mart W. 07/24/2022 10:15 A M Medical Record Number: [id number] Patient Account Number: [id number] Date of Birth/Sex: Treating RN: [birth year] (81 y.o. [name], Lauren Primary Care Kerrie Timm: [name] Other Clinician: Referring Kariann Wecker: Treating Meghna Hagmann/Extender: [name], [name] in Treatment: 9 Edema Assessment Assessed: [Left: No] [Right:  Yes] Edema: [Left: Ye] [Right: s] Calf Left: Right: Point of Measurement: From Medial Instep 39 cm Ankle Left: Right: Point of Measurement: From Medial Instep 25.5 cm Vascular Assessment Pulses: Dorsalis Pedis Palpable: [Right:Yes] Posterior Tibial Palpable: [Right:Yes] Electronic Signature(s) Signed: 07/26/2022 11:19:08 AM By: Fonnie Mu RN Entered By: Fonnie Mu on 07/24/2022 10:07:42 -------------------------------------------------------------------------------- Multi-Disciplinary Care Plan Details Patient Name: Date of Service: [name], [name] 07/24/2022 10:15 A M Medical Record Number: [id number] Patient Account Number: [id number] Date of Birth/Sex: Treating RN: [birth year] (82 y.o. 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 Compression as ordered Provide education on venous insufficiency Notes: Wound/Skin Impairment Nursing Diagnoses: Impaired tissue integrity Knowledge deficit related to ulceration/compromised skin integrity Goals: Patient/caregiver will verbalize understanding of skin care regimen Date Initiated: 05/22/2022 Target Resolution Date: 08/09/2022 Goal Status:  Active Ulcer/skin breakdown will have a volume reduction of 30% by week 4 Date Initiated: 05/22/2022 Date Inactivated: 07/10/2022 Target Resolution Date: 07/06/2022 Unmet Reason: see wound Goal Status: Unmet measurement. Interventions: Assess patient/caregiver ability to obtain necessary supplies Assess patient/caregiver ability to perform ulcer/skin care regimen upon admission and as needed Assess ulceration(s) every visit Provide education on ulcer and skin care Notes: Electronic Signature(s) Signed: 07/25/2022 2:41:21 PM By: Shawn Stall RN, BSN Entered By: Shawn Stall on 07/24/2022 10:08:24 -------------------------------------------------------------------------------- Pain Assessment Details Patient Name: Date of Service: [name], [name] 07/24/2022 10:15 A M Medical Record Number: [id number] Patient Account Number: [id number] Date of Birth/Sex: Treating RN: [birth year] (82 y.o. [name], Lauren Primary Care Toya Palacios: [name] Other Clinician: Referring Yadiel Aubry: Treating Kately Graffam/Extender: [name], [name] in Treatment: 9 Active Problems Location of Pain Severity and Description of Pain Patient Has Paino Yes Site Locations Pain Location: Generalized Pain, Pain in Ulcers With Dressing Change: Yes Duration of the Pain. Constant / Intermittento Constant Rate the pain. Current Pain Level: 6 Worst Pain Level: 10 Least Pain Level: 0 Tolerable Pain Level: 6 Character of Pain Describe the Pain: MAUDIE, SHINGLEDECKER ([id number]) [id number] Page 4 of 6 Pain Management and Medication Current Pain Management: Medication: No Cold Application: No Rest: No Massage: No Activity: No T.E.N.S.: No Heat Application: No Leg drop or elevation: No Is the Current Pain Management Adequate: Adequate How does your wound impact your activities of daily livingo Sleep: No Bathing: No Appetite:  No Relationship With Others: No Bladder Continence: No Emotions: No Bowel Continence: No Work: No Toileting: No Drive: No Dressing: No Hobbies: No Electronic Signature(s) Signed: 07/26/2022 11:19:08 AM By: Fonnie Mu RN Entered By: Fonnie Mu on 07/24/2022 10:03:39 -------------------------------------------------------------------------------- Patient/Caregiver Education Details Patient Name: Date of Service: [name] 4/17/2024andnbsp10:15 A M Medical Record Number: [id number] Patient Account Number: [id number] Date of Birth/Gender: Treating RN: 04-28-1940 (82 y.o. [name] Primary Care Physician: [name] Other Clinician: Referring Physician: Treating Physician/Extender: [name], Billey Gosling in Treatment: 9 Education Assessment Education Provided To: Patient Education Topics Provided Wound/Skin Impairment: Handouts: Caring for Your Ulcer Methods: Explain/Verbal Responses: Reinforcements needed Electronic Signature(s) Signed: 07/25/2022 2:41:21 PM By: Shawn Stall RN, BSN Entered By: Shawn Stall on 07/24/2022 10:08:36 -------------------------------------------------------------------------------- Wound Assessment Details Patient Name: Date of Service: [name], [name] 07/24/2022 10:15 A M Medical Record Number: [id number] Patient Account Number: [id number] Date of Birth/Sex: Treating RN: 18-Nov-1940 (82 y.o. [name], Lauren Primary Care Kimya Mccahill: [name] Other Clinician: Referring Clete Kuch: Treating Zander Ingham/Extender: [name], [name] in Treatment: 9 Wound Status Wound Number: 5 Primary Venous Leg Ulcer Etiology: Wound Location: Right, Lateral Lower Leg Wound Open Wounding Event: Trauma Status: Date Acquired: 04/17/2022 Comorbid Sleep Apnea, Hypertension, Peripheral Venous  Disease, Weeks Of Treatment: 9 History:  Osteoarthritis, Neuropathy Clustered Wound: No Photos GERRICA, CYGAN ([id number]) [id number] Page 5 of 6 Wound Measurements Length: (cm) 12 Width: (cm) 5.5 Depth: (cm) 0.1 Area: (cm) 51.836 Volume: (cm) 5.184 % Reduction in Area: -98.1% % Reduction in Volume: 34% Epithelialization: None Tunneling: No Undermining: No Wound Description Classification: Full Thickness Without Exposed Support Wound Margin: Distinct, outline attached Exudate Amount: Medium Exudate Type: Serosanguineous Exudate Color: red, brown Structures Foul Odor After Cleansing: No Slough/Fibrino Yes Wound Bed Granulation Amount: Large (67-100%) Exposed Structure Granulation Quality: Red Fascia Exposed: No Necrotic Amount: Small (1-33%) Fat Layer (Subcutaneous Tissue) Exposed: Yes Necrotic Quality: Adherent Slough Tendon Exposed: No Muscle Exposed: No Joint Exposed: No Bone Exposed: No Periwound Skin Texture Texture Color No Abnormalities Noted: No No Abnormalities Noted: No Callus: No Atrophie Blanche: No Crepitus: No Cyanosis: No Excoriation: No Ecchymosis: No Induration: No Erythema: No Rash: No Hemosiderin Staining: Yes Scarring: Yes Mottled: No Pallor: No Moisture Rubor: No No Abnormalities Noted: No Dry / Scaly: Yes Temperature / Pain Maceration: No Tenderness on Palpation: Yes Treatment Notes Wound #5 (Lower Leg) Wound Laterality: Right, Lateral Cleanser Soap and Water Discharge Instruction: May shower and wash wound with dial antibacterial soap and water prior to dressing change. Peri-Wound Care Sween Lotion (Moisturizing lotion) Discharge Instruction: Apply moisturizing lotion as directed Topical Primary Dressing Hydrofera Blue Ready Transfer Foam, 4x5 (in/in) Discharge Instruction: Apply to wound bed as instructed Secondary Dressing ABD Pad, 8x10 Discharge Instruction: Apply over primary dressing as directed. CarboFLEX Odor Control  Dressing, 4x4 in Discharge Instruction: Apply over primary dressing as directed. ASHYAH, QUIZON ([id number]) [id number] Page 6 of 6 Woven Gauze Sponge, Non-Sterile 4x4 in Discharge Instruction: Apply over primary dressing as directed. Zetuvit Plus 4x8 in Discharge Instruction: Apply over primary dressing as directed. Secured With Compression Wrap Urgo K2 Lite, two layer compression system, regular Discharge Instruction: Apply Urgo K2 Lite as directed (alternative to 3 layer compression). Compression Stockings Add-Ons Electronic Signature(s) Signed: 07/26/2022 11:19:08 AM By: Fonnie Mu RN Signed: 07/26/2022 11:38:03 AM By: Thayer Dallas Entered By: Thayer Dallas on 07/24/2022 10:09:36 -------------------------------------------------------------------------------- Vitals Details Patient Name: Date of Service: [name], MA [name] 07/24/2022 10:15 A M Medical Record Number: [id number] Patient Account Number: [id number] Date of Birth/Sex: Treating RN: 1941/01/04 (82 y.o. [name], Lauren Primary Care Kewan Mcnease: [name] Other Clinician: Referring Sahmir Weatherbee: Treating Kempton Milne/Extender: [name], [name] in Treatment: 9 Vital Signs Time Taken: 10:03 Temperature (F): 98.6 Height (in): 62 Pulse (bpm): 54 Weight (lbs): 171 Respiratory Rate (breaths/min): 17 Body Mass Index (BMI): 31.3 Blood Pressure (mmHg): 155/78 Reference Range: 80 - 120 mg / dl Electronic Signature(s) Signed: 07/26/2022 11:19:08 AM By: Fonnie Mu RN Entered By: Fonnie Mu on 07/24/2022 10:06:38

## 2022-07-31 ENCOUNTER — Encounter (HOSPITAL_BASED_OUTPATIENT_CLINIC_OR_DEPARTMENT_OTHER): Payer: Medicare HMO | Admitting: Physician Assistant

## 2022-08-07 ENCOUNTER — Encounter (HOSPITAL_BASED_OUTPATIENT_CLINIC_OR_DEPARTMENT_OTHER): Payer: Medicare HMO | Attending: Physician Assistant | Admitting: Physician Assistant

## 2022-08-07 DIAGNOSIS — I2510 Atherosclerotic heart disease of native coronary artery without angina pectoris: Secondary | ICD-10-CM | POA: Diagnosis not present

## 2022-08-07 DIAGNOSIS — L97822 Non-pressure chronic ulcer of other part of left lower leg with fat layer exposed: Secondary | ICD-10-CM | POA: Diagnosis not present

## 2022-08-07 DIAGNOSIS — I87331 Chronic venous hypertension (idiopathic) with ulcer and inflammation of right lower extremity: Secondary | ICD-10-CM | POA: Insufficient documentation

## 2022-08-07 DIAGNOSIS — L97812 Non-pressure chronic ulcer of other part of right lower leg with fat layer exposed: Secondary | ICD-10-CM | POA: Insufficient documentation

## 2022-08-08 NOTE — Progress Notes (Signed)
ALYVIA, DERK ([id number]) [id number] Page 1 of 8 Visit Report for 08/07/2022 Chief Complaint Document Details Patient Name: Date of Service: Jamie [name], [name]. 08/07/2022 10:15 A M Medical Record Number: [id number] Patient Account Number: [id number] Date of Birth/Sex: Treating RN: 1940-08-02 (82 y.o. F) Primary Care Provider: [name] Other Clinician: Referring Provider: Treating Provider/Extender: [name], [name] in Treatment: 11 Information Obtained from: Patient Chief Complaint Bilateral LE Ulcers Electronic Signature(s) Signed: 08/07/2022 10:40:02 AM By: Allen Derry PA-C Entered By: Allen Derry on 08/07/2022 10:40:02 -------------------------------------------------------------------------------- HPI Details Patient Name: Date of Service: Jamie [name], [name] 08/07/2022 10:15 A M Medical Record Number: [id number] Patient Account Number: [id number] Date of Birth/Sex: Treating RN: June 30, 1940 (82 y.o. F) Primary Care Provider: [name] Other Clinician: Referring Provider: Treating Provider/Extender: [name], [name] in Treatment: 11 History of Present Illness HPI Description: ADMISSION 06/30/2020 Mrs. Crow is an 82 year old woman who lives in [place]. She is here with her niece for review of wounds on the left medial lower leg and ankle. These have apparently been present for over a year and she followed with Dr. Olegario Messier at the [facility] in [place] for quite a period of time although it looks as though there was a initial consult wound from Dr. Marcha Solders on February 18 presumably there was therefore hiatus. At that point the wounds were described as being there for 3 months. She also tells me she was at the wound care center in [place] for a period of time with this. 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 She had venous studies that were negative for DVT ABIs on the right were 1.01 on the left 1.06. She has . had previous applications of puraply, compression which she does not tolerate very well. She has had several rounds of oral antibiotic therapy. She complains of unrelenting pain and she is seeing Dr. Reece Agar V of pain management apparently was on oxycodone but that did not help. She also had a skin biopsy done by Dr. Olegario Messier although we do not have that result. She does not appear to have an arterial issue. I am not completely clear what she has been putting on the wounds lately. 3/31; this patient has a particularly nasty set of wounds on the left medial ankle in the middle of what looks to be hemosiderin deposition secondary to chronic venous insufficiency. She has a lot of pain followed by pain management. Dr. Marcha Solders apparently did a biopsy of something on the left leg last fall what I would like to get this result. She has a history of MRSA treatment. I do not believe she had reflux studies but she did have DVT rule out studies. Previous ABIs have not suggested arterial insufficiency 4/7; difficult wounds on the left medial ankle probably chronic venous insufficiency. With considerable effort on behalf of our case manager we were able to finally to speak to somebody at the hospital in [place] who indicated that no biopsy of this area have been done even though the patient describes this in some detail and is even able to point out where she thinks the biopsy was done. We have been using Sorbact. The PCR culture I did showed polymicrobial identification with Pseudomonas, staph aureus, Peptostreptococcus. All of this and low titers. Resistance genes identified were MRSA, staph virulence gene and tetracycline. We are going to send this to [facility] for a topical antibiotic which is something  that we have had good success with recently in large venous ulcers with a lot of purulent  drainage. There would not be an easy oral alternative here possibly line escalated and ciprofloxacin if we need to use systemic antibiotic 4/15; difficult area on the left medial ankle. Most likely chronic venous insufficiency. I think she will probably need venous reflux study I think she had DVT rule outs but not venous reflux studies. We have not yet obtained the topical antibiotics. She has home health changing the dressing we have been using Sorbact for adherent fibrinous debris on the surface. Very difficult to remove 4/22; patient presents for 1 week follow-up. She has been using sore back under compression wraps and these are changed 3 times a week with home health. She also had [facility] antibiotics sent to her house and brought them in today. She has no complaints or issues today. 5/2; patient is here for follow-up. She has been using Sorbact under compression. Very painful wound. She has been using [facility] antibiotics. Not much improvement although the more medial part of the wound has cleaned up nicely and the larger part of the wound about 50% slough covered. Part of the issue here is that she had stays at both [facility] wound care center, [facility] wound care center and now [place]. Not sure if she has had venous reflux studies. As far as we are able to tell she did not have a biopsy. My notes state that she did not have venous reflux studies just DVT rule outs. 5/16; patient goes for venous reflux studies this afternoon. She says that wound was biopsied which sounds like punch biopsies by Dr. Lynden Ang we do not have these results. We are using Sorbact. Very difficult wound to debride 5/23; patient presents for 1 week follow-up. She reports tolerating the wraps well with sorbact underneath. She had ABIs and venous reflux studies done. She Jamie [name], Jamie [name] ([id number]) [id number] Page 2 of 8 has no issues or complaints today. She denies acute signs of infection. 6/6;  I have reviewed the patient's vascular studies. Wounds are on the left medial and posterior calf. She had significant reflux in the greater saphenous vein in the in the mid thigh, distal thigh knee and the small saphenous vein in the popliteal fossa. The vein diameters do not look too impressive though. She is going to see the vascular surgeon on Wednesday. She also had venous reflux in the right common femoral vein. She did not have any evidence of a DVT or SVT I am wondering whether there is an ablation procedure that would benefit her in the greater saphenous vein on the left. . She tells [place] that home health put the dressing on too tight and she took off 1 layer. The swelling in her left leg is a little worse as a result of this. Not much change in the wound measurements so the surface of the wound looks better Her arterial studies showed an ABI on the right of 1.14 with a triphasic waveform and a great toe pressure of 0.89. On the left her ABIs were noncompressible at 1.34 but with triphasic waveforms and a TBI of 0.97. Her greater toe pressure was 122. There was no evidence of significant bilateral arterial disease 6/20 patient went to see Dr. Durwin Nora. He did not think she had significant arterial disease. In terms of her venous duplex on the right side there was no evidence of a DVT or SVT there was deep venous reflux involving the common  femoral vein no superficial vein reflux on the left side there was no DVT or SVT there was no deep vein graft reflux there was some reflux in the greater saphenous vein from the mid thigh to the knee but the vein here was not dilated. He thought these were venous wounds he prescribed a compression pump but I am not sure who we ordered it from The patient has been approved for Apligraf. Still using silver collagen this week 7/5; Apligraf #1 7/19 Apligraf #2. Decent improvement in the condition of the wound bed. Epithelialization distal 8/2 Apligraf #3. No issues  or complaints. Denies signs of infection. 8/17 Apligraf #4. No issues or concerns. Some complaints of pruritus and the rash. Our intake nurse brought up the fact that she had previously indicated possible cotton layer sensitivity we will therefore use kerlix in the bottom layer of the compression 8/30; the patient comes in with the area on her left medial leg just about healed. There is a superficial area more towards the tibia and a smaller open area distally everything else is epithelialized. I do not think she requires another Apligraf 9/13; left medial leg is healed. 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 Her edema control is good Readmisstion: 05-22-2022 upon evaluation today patient presents for initial inspection here in our clinic concerning issues that she has been having with a wound of the right lateral lower extremity. This is an area of a previous skin graft she tells me. With that being said she unfortunately had a scrape on this that occurred around 10 January. Since that time she has noted that this has just continued to get bigger and bigger in her niece who is present with her today actually states that 2 weeks ago when she saw that it was significantly smaller than what she sees currently. Obviously this is of utmost concern as we do not want this to continue to get larger when arrested and get moving in the right direction. Fortunately there does not appear to be any signs of systemic infection though locally I think we probably do have some infection present. I would obtain a culture to see what we have going on here and then we will subsequently see where things go going forward. Fortunately I think that she is in the right place at this point being at the wound center we can definitely do something to try to get this moving in the right direction. Patient does have a history of chronic venous insufficiency as well as  coronary artery disease. In the past she has done well with compression wraps on the start with a 3 layer wrap I think she is probably can end up going to a 4-layer at some point but we will see how things do over the next week. She has been using wound cleanser along with she tells me a zinc and topical but again I am not sure exactly what that was. 05-29-2022 upon evaluation today patient appears to be doing well currently in regard to her wound. This actually is showing signs of improvement I am happy in that regard unfortunately her left leg has an area on the shin that has opened. This is the region where one of the areas at least we have previously taken care of. Nonetheless this is small hoping we get it under control before things worsen significantly. 06-05-2022 upon evaluation today patient appears to be doing well currently in regard  to her wounds. The actually seem to be fairly clean she is having still quite a bit of problem with pain at this point she would like to not have debridement today for all possible. With that being said I do believe that we are making some good progress I think the [facility] is doing a decent job here. 3/6; patient has had 3/6; patient with known severe chronic venous insufficiency. She apparently had a traumatic wound at home and has a reasonably substantial wound on the right lateral lower leg and a smaller one on the left anterior lower leg as well. We have been using Hydrofera Blue and Unna boots 3/13; patient presents for follow-up. We have been using Hydrofera Blue under Coflex to the legs bilaterally. She has no issues or complaints today. 06-26-2022 upon evaluation today patient appears to be doing well currently in regard to her wound. This is measuring a little bit larger however. Fortunately I do not see any signs of infection this is on the right side. Fortunately the left side is actually completely healed which is great news. 07-03-2022 patient's  wound unfortunately is continuing to show signs of being worse. Her compression which was the Tubigrip that I thought should be able to pull up actually slipped down and she was not able to pull that back up. With that being said that means that unfortunately her wound has continued to deteriorate since I last saw her. This is definitely not the direction that we are looking for. I discussed with her that I do believe we need to go ahead and see about getting her started on antibiotics and I subsequently would also like to go ahead and see about getting things moving forward with regard to a wound culture and making a change up her dressings as well but this can be changed more frequently than just once a week. 07-10-2022 upon evaluation today patient actually appears to be doing much better. I do believe that the antibiotics have been beneficial for her. Fortunately I do not see any signs of active infection locally nor systemically which is great news. No fevers, chills, nausea, vomiting, or diarrhea. 07-17-2022 upon evaluation today patient appears to be doing well currently in regard to her wound which is actually showing signs of improvement this is slow but nonetheless prevalent that we are seeing good improvement here. Fortunately I do not see any signs of active infection locally nor systemically which is great news. 07-24-2022 upon evaluation today patient appears to be doing well currently in regard to her wound although the wrap that we had on has been slipping down and she really does not have anybody to help her with this at home health is not going to be coming out we could not get anybody that would actually be able to do the dressing changes. Fortunately I do not see any signs of active infection locally nor systemically which is great news. 08-07-22 poorly in regard to her leg compared to what it was previous. Fortunately there does not appear to be any signs of active infection locally  nor systemically which is great news. No fevers, chills, nausea, vomiting, or diarrhea. With that being said it does appear that the patient may have some cellulitis in the leg which is not good. Electronic Signature(s) Signed: 08/07/2022 11:07:56 AM By: Allen Derry PA-C Entered By: Allen Derry on 08/07/2022 11:07:55 -------------------------------------------------------------------------------- Physical Exam Details Patient Name: Date of Service: Jamie [name], [place] W. 08/07/2022 10:15 A M  Medical Record Number: [id number] Patient Account Number: [id number] TYARRA, NOLTON ([id number]) [id number] Page 3 of 8 Date of Birth/Sex: Treating RN: 1940/10/16 (82 y.o. F) Primary Care Provider: Other Clinician: [name] Referring Provider: Treating Provider/Extender: [name], [name] in Treatment: 11 Constitutional Chronically ill appearing but in no apparent acute distress. Respiratory normal breathing without difficulty. Psychiatric this patient is able to make decisions and demonstrates good insight into disease process. Alert and Oriented x 3. pleasant and cooperative. Notes Upon inspection patient's wound bed actually showed signs of good granulation epithelization at this point. Fortunately there does not appear to be any evidence of infection locally nor systemically which is great news and overall I am extremely pleased systemically though locally I do feel like she probably has some cellulitis going on here and I think this is why she is having such significant pain. It was not this painful for her last week. Subsequently she did very well with the Cipro I think we may not have had this quite long enough to completely clear the infection however she was on it for 14 days. Electronic Signature(s) Signed: 08/07/2022 11:08:28 AM By: Allen Derry PA-C Entered By: Allen Derry on 08/07/2022  11:08:28 -------------------------------------------------------------------------------- Physician Orders Details Patient Name: Date of Service: Jamie [name], [name] 08/07/2022 10:15 A M Medical Record Number: [id number] Patient Account Number: [id number] Date of Birth/Sex: Treating RN: [birth year] (82 y.o. [name] Primary Care Provider: [name] Other Clinician: Referring Provider: Treating Provider/Extender: [name], [name] in Treatment: 84 Verbal / Phone Orders: No Diagnosis Coding ICD-10 Coding Code Description I87.331 Chronic venous hypertension (idiopathic) with ulcer and inflammation of right lower extremity 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d L97.822 Non-pressure chronic ulcer of other part of left lower leg with fat layer exposed I25.10 Atherosclerotic heart disease of native coronary artery without angina pectoris Follow-up Appointments ppointment in 1 week. Leonard Schwartz Wednesday 08/14/2022 1100 (move your appt to this time) room 8 Return A ppointment in 2 weeks. Leonard Schwartz Wednesday 08/21/2022 1015 room 8 (already has an appt). Return A Other: - Pick up the oral antibiotics from your pharmacy. Anesthetic Wound #5 Right,Lateral Lower Leg (In clinic) Topical Lidocaine 5% applied to wound bed (In clinic) Topical Lidocaine 4% applied to wound bed Bathing/ Shower/ Hygiene May shower with protection but do not get wound dressing(s) wet. Protect dressing(s) with water repellant cover (for example, large plastic bag) or a cast cover and may then take shower. - may purchase cast protector from [facility], [facility] or [employer] Edema Control - Lymphedema / SCD / Other Right Lower Extremity Elevate legs to the level of the heart or above for 30 minutes daily and/or when sitting for 3-4 times a day throughout the day. Avoid standing for long periods of time. Exercise regularly Home  Health Wound #5 Right,Lateral Lower Leg Admit to Home Health for skilled nursing wound care. May utilize formulary equivalent dressing for wound treatment orders unless otherwise specified. New wound care orders this week; continue Home Health for wound care. May utilize formulary equivalent dressing for wound treatment orders unless otherwise specified. - Home health to change twice a week and wound center weekly. see dressings below. Other Home Health Orders/Instructions: - [facility] in [place], [place] fax # [phone]. LIEL, RUDDEN ([id number]) [id number] Page 4 of 8 Wound Treatment Wound #5 - Lower Leg Wound Laterality: Right, Lateral Cleanser: Soap and Water 3  x Per Week/30 Days Discharge Instructions: May shower and wash wound with dial antibacterial soap and water prior to dressing change. Peri-Wound Care: Sween Lotion (Moisturizing lotion) 3 x Per Week/30 Days Discharge Instructions: Apply moisturizing lotion as directed Prim Dressing: Hydrofera Blue Ready Transfer Foam, 4x5 (in/in) (Generic) 3 x Per Week/30 Days ary Discharge Instructions: Apply to wound bed as instructed Secondary Dressing: ABD Pad, 8x10 3 x Per Week/30 Days Discharge Instructions: Apply over primary dressing as directed. Secondary Dressing: CarboFLEX Odor Control Dressing, 4x4 in 3 x Per Week/30 Days Discharge Instructions: Apply over primary dressing as directed. Secondary Dressing: Woven Gauze Sponge, Non-Sterile 4x4 in 3 x Per Week/30 Days Discharge Instructions: Apply over primary dressing as directed. Secondary Dressing: Zetuvit Plus 4x8 in 3 x Per Week/30 Days Discharge Instructions: Apply over primary dressing as directed. Secured With: [employer], 4.5x3.1 (in/yd) 3 x Per Week/30 Days Discharge Instructions: Secure with Kerlix as directed. Compression Wrap: tubigrip size D one layer 3 x Per Week/30 Days Discharge Instructions: apply over the  right leg. Change with dressing changes. Patient Medications llergies: gabapentin, tizanidine, amlodipine, niacin, aspirin, lisinopril, valsartan A Notifications Medication Indication Start End 08/07/2022 levofloxacin DOSE 1 - oral 750 mg tablet - 1 tablet oral once daily x 21 days Electronic Signature(s) Signed: 08/07/2022 11:09:35 AM By: Allen Derry PA-C Entered By: Allen Derry on 08/07/2022 11:09:35 -------------------------------------------------------------------------------- Problem List Details Patient Name: Date of Service: Jamie [name], [name] 08/07/2022 10:15 A M Medical Record Number: [id number] Patient Account Number: [id number] Date of Birth/Sex: Treating RN: 1940-10-13 (82 y.o. [name] Primary Care Provider: [name] Other Clinician: Referring Provider: Treating Provider/Extender: [name], [name] in Treatment: 11 Active Problems ICD-10 Encounter Code Description Active Date MDM Diagnosis I87.331 Chronic venous hypertension (idiopathic) with ulcer and inflammation of right 05/22/2022 No Yes lower extremity L97.812 Non-pressure chronic ulcer of other part of right lower leg with fat layer 05/22/2022 No Yes exposed L97.822 Non-pressure chronic ulcer of other part of left lower leg with fat layer exposed2/21/2024 No Yes FALAN, HENSLER ([id number]) [id number] Page 5 of 8 I25.10 Atherosclerotic heart disease of native coronary artery without angina pectoris 05/22/2022 No Yes Inactive Problems Resolved Problems Electronic Signature(s) Signed: 08/07/2022 10:39:41 AM By: Allen Derry PA-C Entered By: Allen Derry on 08/07/2022 10:39:41 -------------------------------------------------------------------------------- Progress Note Details Patient Name: Date of Service: Jamie [name], [name] 08/07/2022 10:15 A M Medical Record Number: [id number] Patient Account Number: [id number] Date of  Birth/Sex: Treating RN: September 27, 1940 (82 y.o. F) Primary Care Provider: [name] Other Clinician: Referring Provider: Treating Provider/Extender: [name], [name] in Treatment: 11 Subjective Chief Complaint Information obtained from Patient Bilateral LE Ulcers History of Present Illness (HPI) ADMISSION 06/30/2020 Mrs. Blucher is an 82 year old woman who lives in [place]. She is here with her niece for review of wounds on the left medial lower leg and ankle. These have apparently been present for over a year and she followed with Dr. Olegario Messier at the [facility] in [place] for quite a period of time although it looks as though there was a initial consult wound from Dr. Marcha Solders on February 18 presumably there was therefore hiatus. At that point the wounds were described as being there for 3 months. She also tells me she was at the wound care center in [place] for a period of time with this. 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 She had venous studies  that were negative for DVT ABIs on the right were 1.01 on the left 1.06. She has . had previous applications of puraply, compression which she does not tolerate very well. She has had several rounds of oral antibiotic therapy. She complains of unrelenting pain and she is seeing Dr. Reece Agar V of pain management apparently was on oxycodone but that did not help. She also had a skin biopsy done by Dr. Olegario Messier although we do not have that result. She does not appear to have an arterial issue. I am not completely clear what she has been putting on the wounds lately. 3/31; this patient has a particularly nasty set of wounds on the left medial ankle in the middle of what looks to be hemosiderin deposition secondary to chronic venous insufficiency. She has a lot of pain followed by pain management. Dr. Marcha Solders apparently did a biopsy of something on the left  leg last fall what I would like to get this result. She has a history of MRSA treatment. I do not believe she had reflux studies but she did have DVT rule out studies. Previous ABIs have not suggested arterial insufficiency 4/7; difficult wounds on the left medial ankle probably chronic venous insufficiency. With considerable effort on behalf of our case manager we were able to finally to speak to somebody at the hospital in [place] who indicated that no biopsy of this area have been done even though the patient describes this in some detail and is even able to point out where she thinks the biopsy was done. We have been using Sorbact. The PCR culture I did showed polymicrobial identification with Pseudomonas, staph aureus, Peptostreptococcus. All of this and low titers. Resistance genes identified were MRSA, staph virulence gene and tetracycline. We are going to send this to [facility] for a topical antibiotic which is something that we have had good success with recently in large venous ulcers with a lot of purulent drainage. There would not be an easy oral alternative here possibly line escalated and ciprofloxacin if we need to use systemic antibiotic 4/15; difficult area on the left medial ankle. Most likely chronic venous insufficiency. I think she will probably need venous reflux study I think she had DVT rule outs but not venous reflux studies. We have not yet obtained the topical antibiotics. She has home health changing the dressing we have been using Sorbact for adherent fibrinous debris on the surface. Very difficult to remove 4/22; patient presents for 1 week follow-up. She has been using sore back under compression wraps and these are changed 3 times a week with home health. She also had [facility] antibiotics sent to her house and brought them in today. She has no complaints or issues today. 5/2; patient is here for follow-up. She has been using Sorbact under compression. Very painful wound.  She has been using [facility] antibiotics. Not much improvement although the more medial part of the wound has cleaned up nicely and the larger part of the wound about 50% slough covered. Part of the issue here is that she had stays at both [facility] wound care center, [facility] wound care center and now [place]. Not sure if she has had venous reflux studies. As far as we are able to tell she did not have a biopsy. My notes state that she did not have venous reflux studies just DVT rule outs. 5/16; patient goes for venous reflux studies this afternoon. She says that wound was biopsied which sounds like punch biopsies by Dr. Lynden Ang  we do not have these results. We are using Sorbact. Very difficult wound to debride 5/23; patient presents for 1 week follow-up. She reports tolerating the wraps well with sorbact underneath. She had ABIs and venous reflux studies done. She has no issues or complaints today. She denies acute signs of infection. 6/6; I have reviewed the patient's vascular studies. Wounds are on the left medial and posterior calf. She had significant reflux in the greater saphenous vein in the in the mid thigh, distal thigh knee and the small saphenous vein in the popliteal fossa. The vein diameters do not look too impressive though. She is going to see the vascular surgeon on Wednesday. She also had venous reflux in the right common femoral vein. She did not have any evidence of a DVT or SVT I am wondering whether there is an ablation procedure that would benefit her in the greater saphenous vein on the left. . She tells [place] that home health put the dressing on too tight and she took off 1 layer. The swelling in her left leg is a little worse as a result of this. Not much change in the wound measurements so the surface of the wound looks better LOVELYN, SHEERAN ([id number]) [id number] Page 6 of 8 Her arterial studies showed an ABI on the right of 1.14 with a triphasic  waveform and a great toe pressure of 0.89. On the left her ABIs were noncompressible at 1.34 but with triphasic waveforms and a TBI of 0.97. Her greater toe pressure was 122. There was no evidence of significant bilateral arterial disease 6/20 patient went to see Dr. Durwin Nora. He did not think she had significant arterial disease. In terms of her venous duplex on the right side there was no evidence of a DVT or SVT there was deep venous reflux involving the common femoral vein no superficial vein reflux on the left side there was no DVT or SVT there was no deep vein graft reflux there was some reflux in the greater saphenous vein from the mid thigh to the knee but the vein here was not dilated. He thought these were venous wounds he prescribed a compression pump but I am not sure who we ordered it from The patient has been approved for Apligraf. Still using silver collagen this week 7/5; Apligraf #1 7/19 Apligraf #2. Decent improvement in the condition of the wound bed. Epithelialization distal 8/2 Apligraf #3. No issues or complaints. Denies signs of infection. 8/17 Apligraf #4. No issues or concerns. Some complaints of pruritus and the rash. Our intake nurse brought up the fact that she had previously indicated possible cotton layer sensitivity we will therefore use kerlix in the bottom layer of the compression 8/30; the patient comes in with the area on her left medial leg just about healed. There is a superficial area more towards the tibia and a smaller open area distally everything else is epithelialized. I do not think she requires another Apligraf 9/13; left medial leg is healed. 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 Her edema control is good Readmisstion: 05-22-2022 upon evaluation today patient presents for initial inspection here in our clinic concerning issues that she has been having with a wound of the right lateral lower  extremity. This is an area of a previous skin graft she tells me. With that being said she unfortunately had a scrape on this that occurred around 10 January. Since that time she has noted  that this has just continued to get bigger and bigger in her niece who is present with her today actually states that 2 weeks ago when she saw that it was significantly smaller than what she sees currently. Obviously this is of utmost concern as we do not want this to continue to get larger when arrested and get moving in the right direction. Fortunately there does not appear to be any signs of systemic infection though locally I think we probably do have some infection present. I would obtain a culture to see what we have going on here and then we will subsequently see where things go going forward. Fortunately I think that she is in the right place at this point being at the wound center we can definitely do something to try to get this moving in the right direction. Patient does have a history of chronic venous insufficiency as well as coronary artery disease. In the past she has done well with compression wraps on the start with a 3 layer wrap I think she is probably can end up going to a 4-layer at some point but we will see how things do over the next week. She has been using wound cleanser along with she tells me a zinc and topical but again I am not sure exactly what that was. 05-29-2022 upon evaluation today patient appears to be doing well currently in regard to her wound. This actually is showing signs of improvement I am happy in that regard unfortunately her left leg has an area on the shin that has opened. This is the region where one of the areas at least we have previously taken care of. Nonetheless this is small hoping we get it under control before things worsen significantly. 06-05-2022 upon evaluation today patient appears to be doing well currently in regard to her wounds. The actually seem to be  fairly clean she is having still quite a bit of problem with pain at this point she would like to not have debridement today for all possible. With that being said I do believe that we are making some good progress I think the [facility] is doing a decent job here. 3/6; patient has had 3/6; patient with known severe chronic venous insufficiency. She apparently had a traumatic wound at home and has a reasonably substantial wound on the right lateral lower leg and a smaller one on the left anterior lower leg as well. We have been using Hydrofera Blue and Unna boots 3/13; patient presents for follow-up. We have been using Hydrofera Blue under Coflex to the legs bilaterally. She has no issues or complaints today. 06-26-2022 upon evaluation today patient appears to be doing well currently in regard to her wound. This is measuring a little bit larger however. Fortunately I do not see any signs of infection this is on the right side. Fortunately the left side is actually completely healed which is great news. 07-03-2022 patient's wound unfortunately is continuing to show signs of being worse. Her compression which was the Tubigrip that I thought should be able to pull up actually slipped down and she was not able to pull that back up. With that being said that means that unfortunately her wound has continued to deteriorate since I last saw her. This is definitely not the direction that we are looking for. I discussed with her that I do believe we need to go ahead and see about getting her started on antibiotics and I subsequently would also like  to go ahead and see about getting things moving forward with regard to a wound culture and making a change up her dressings as well but this can be changed more frequently than just once a week. 07-10-2022 upon evaluation today patient actually appears to be doing much better. I do believe that the antibiotics have been beneficial for her. Fortunately I do not see any  signs of active infection locally nor systemically which is great news. No fevers, chills, nausea, vomiting, or diarrhea. 07-17-2022 upon evaluation today patient appears to be doing well currently in regard to her wound which is actually showing signs of improvement this is slow but nonetheless prevalent that we are seeing good improvement here. Fortunately I do not see any signs of active infection locally nor systemically which is great news. 07-24-2022 upon evaluation today patient appears to be doing well currently in regard to her wound although the wrap that we had on has been slipping down and she really does not have anybody to help her with this at home health is not going to be coming out we could not get anybody that would actually be able to do the dressing changes. Fortunately I do not see any signs of active infection locally nor systemically which is great news. 08-07-22 poorly in regard to her leg compared to what it was previous. Fortunately there does not appear to be any signs of active infection locally nor systemically which is great news. No fevers, chills, nausea, vomiting, or diarrhea. With that being said it does appear that the patient may have some cellulitis in the leg which is not good. Objective Constitutional Chronically ill appearing but in no apparent acute distress. Vitals Time Taken: 10:30 AM, Height: 62 in, Weight: 171 lbs, BMI: 31.3, Temperature: 98.6 F, Pulse: 91 bpm, Respiratory Rate: 20 breaths/min, Blood Pressure: 133/89 mmHg. Respiratory normal breathing without difficulty. Psychiatric this patient is able to make decisions and demonstrates good insight into disease process. Alert and Oriented x 3. pleasant and cooperative. MARINE, LEZOTTE ([id number]) [id number] Page 7 of 8 General Notes: Upon inspection patient's wound bed actually showed signs of good granulation epithelization at this point. Fortunately there does not  appear to be any evidence of infection locally nor systemically which is great news and overall I am extremely pleased systemically though locally I do feel like she probably has some cellulitis going on here and I think this is why she is having such significant pain. It was not this painful for her last week. Subsequently she did very well with the Cipro I think we may not have had this quite long enough to completely clear the infection however she was on it for 14 days. Integumentary (Hair, Skin) Wound #5 status is Open. Original cause of wound was Trauma. The date acquired was: 04/17/2022. The wound has been in treatment 11 weeks. The wound is located on the Right,Lateral Lower Leg. The wound measures 13.2cm length x 6.5cm width x 0.2cm depth; 67.387cm^2 area and 13.477cm^3 volume. There is Fat Layer (Subcutaneous Tissue) exposed. There is no tunneling or undermining noted. There is a large amount of serosanguineous drainage noted. The wound margin is distinct with the outline attached to the wound base. There is large (67-100%) red, pink, hyper - granulation within the wound bed. There is a small (1- 33%) amount of necrotic tissue within the wound bed including Adherent Slough. The periwound skin appearance exhibited: Scarring, Maceration, Hemosiderin Staining. The periwound skin appearance did not exhibit: Callus, Crepitus,  Excoriation, Induration, Rash, Dry/Scaly, Atrophie Blanche, Cyanosis, Ecchymosis, Mottled, Pallor, Rubor, Erythema. The periwound has tenderness on palpation. 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 Leonard Schwartz Wednesday 08/14/2022 1100 (move your appt to this time) room 8 Return Appointment in 2 weeks. Leonard Schwartz Wednesday 08/21/2022 1015 room 8 (already has an appt). Other: - Pick up the oral antibiotics from your pharmacy. Anesthetic: Wound #5 Right,Lateral Lower Leg: (In clinic) Topical Lidocaine 5% applied to wound bed (In clinic) Topical Lidocaine 4% applied to wound bed Bathing/ Shower/ Hygiene: May shower with protection but do not get wound dressing(s) wet. Protect dressing(s) with water repellant cover (for example, large plastic bag) or a cast cover and may then take shower. - may purchase cast protector from [facility], [facility] or [employer] Edema Control - Lymphedema / SCD / Other: Elevate legs to the level of the heart or above for 30 minutes daily and/or when sitting for 3-4 times a day throughout the day. Avoid standing for long periods of time. Exercise regularly Home Health: Wound #5 Right,Lateral Lower Leg: Admit to Home Health for skilled nursing wound care. May utilize formulary equivalent dressing for wound treatment orders unless otherwise specified. New wound care orders this week; continue Home Health for wound care. May utilize formulary equivalent dressing for wound treatment orders unless otherwise specified. - Home health to change twice a week and wound center weekly. see dressings below. Other Home Health Orders/Instructions: - [facility] in [place], [place] fax # [phone]. The following medication(s) was prescribed: levofloxacin oral 750 mg tablet 1 1 tablet oral once daily x 21 days starting 08/07/2022 WOUND #5: - Lower Leg Wound Laterality: Right, Lateral Cleanser: Soap and Water 3 x Per Week/30 Days Discharge Instructions: May shower and wash wound with dial antibacterial soap and water prior to dressing change. Peri-Wound Care: Sween Lotion (Moisturizing lotion) 3 x Per Week/30 Days Discharge Instructions: Apply moisturizing lotion  as directed Prim Dressing: Hydrofera Blue Ready Transfer Foam, 4x5 (in/in) (Generic) 3 x Per Week/30 Days ary Discharge Instructions: Apply to wound bed as instructed Secondary Dressing: ABD Pad, 8x10 3 x Per Week/30 Days Discharge Instructions: Apply over primary dressing as directed. Secondary Dressing: CarboFLEX Odor Control Dressing, 4x4 in 3 x Per Week/30 Days Discharge Instructions: Apply over primary dressing as directed. Secondary Dressing: Woven Gauze Sponge, Non-Sterile 4x4 in 3 x Per Week/30 Days Discharge Instructions: Apply over primary dressing as directed. Secondary Dressing: Zetuvit Plus 4x8 in 3 x Per Week/30 Days Discharge Instructions: Apply over primary dressing as directed. Secured With: [employer], 4.5x3.1 (in/yd) 3 x Per Week/30 Days Discharge Instructions: Secure with Kerlix as directed. Com pression Wrap: tubigrip size D one layer 3 x Per Week/30 Days Discharge Instructions: apply over the right leg. Change with dressing changes. 1. Based on what I am seeing I am going to go ahead and reinitiate treatment with antibiotics. I discussed that with the patient today. Subsequently I am going to get her on Levaquin however which actually I think is going to do much better. I am good to do a higher strength that doing this 750 mg a day. I think that we need to try to  knock this out as quickly as possible. 2. I am also can recommend that the patient should continue to elevate her legs as much as possible. Right now were not to be able to compression wrap due to the fact that she is having such significant pain. Nonetheless I think that that is something to consider as well reinitiating as soon as we get the infection under control We will see patient back for reevaluation in 1 week here in the clinic. If anything worsens or changes patient will contact our office for additional RASHENA, DOWLING ([id number]) [id number] Page 8 of  8 recommendations. Electronic Signature(s) Signed: 08/07/2022 11:09:50 AM By: Allen Derry PA-C Entered By: Allen Derry on 08/07/2022 11:09:50 -------------------------------------------------------------------------------- SuperBill Details Patient Name: Date of Service: Jamie [name], Christean Grief. 08/07/2022 Medical Record Number: [id number] Patient Account Number: [id number] Date of Birth/Sex: Treating RN: October 13, 1940 (82 y.o. Debara Pickett, Yvonne Kendall Primary Care Provider: [name] Other Clinician: Referring Provider: Treating Provider/Extender: [name], [name] in Treatment: 11 Diagnosis Coding ICD-10 Codes Code Description [phone] Chronic venous hypertension (idiopathic) with ulcer and inflammation of right lower extremity 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d L97.822 Non-pressure chronic ulcer of other part of left lower leg with fat layer exposed I25.10 Atherosclerotic heart disease of native coronary artery without angina pectoris Facility Procedures : CPT4 Code: [id number] Description: 99214 - WOUND CARE VISIT-LEV 4 EST PT Modifier: Quantity: 1 Physician Procedures : CPT4 Code Description Modifier [id number] - WC PHYS LEVEL 4 - EST PT ICD-10 Diagnosis Description I87.331 Chronic venous hypertension (idiopathic) with ulcer and inflammation of right lower extremity L97.812 Non-pressure chronic ulcer of other part  of right lower leg with fat layer exposed L97.822 Non-pressure chronic ulcer of other part of left lower leg with fat layer exposed I25.10 Atherosclerotic heart disease of native coronary artery without angina pectoris Quantity: 1 Electronic Signature(s) Signed: 08/07/2022 11:10:12 AM By: Allen Derry PA-C Entered By: Allen Derry on 08/07/2022 11:10:12

## 2022-08-08 NOTE — Progress Notes (Signed)
SANTRESA, LEVETT ([id number]) [id number] Page 1 of 7 Visit Report for 08/07/2022 Arrival Information Details Patient Name: Date of Service: Jamie [name], [name]. 08/07/2022 10:15 A M Medical Record Number: [id number] Patient Account Number: [id number] Date of Birth/Sex: Treating RN: September 20, 1940 (82 y.o. Jamie [name], Jamie [name] Primary Care Porche Steinberger: [name] Other Clinician: Referring Richerd Grime: Treating Amberia Bayless/Extender: [name], [name] in Treatment: 11 Visit Information History Since Last Visit Added or deleted any medications: No Patient Arrived: Wheel Chair Any new allergies or adverse reactions: No Arrival Time: 10:25 Had a fall or experienced change in No Accompanied By: Niece activities of daily living that may affect Transfer Assistance: Manual risk of falls: Patient Identification Verified: Yes Signs or symptoms of abuse/neglect since last visito No Secondary Verification Process Completed: Yes Hospitalized since last visit: No Patient Requires Transmission-Based Precautions: No Implantable device outside of the clinic excluding No Patient Has Alerts: No cellular tissue based products placed in the center since last visit: Has Dressing in Place as Prescribed: Yes Has Compression in Place as Prescribed: No Pain Present Now: Yes Electronic Signature(s) Signed: 08/07/2022 4:44:49 PM By: Shawn Stall RN, BSN Entered By: Shawn Stall on 08/07/2022 10:32:14 -------------------------------------------------------------------------------- Clinic Level of Care Assessment Details Patient Name: Date of Service: Jamie Mart W. 08/07/2022 10:15 A M Medical Record Number: [id number] Patient Account Number: [id number] Date of Birth/Sex: Treating RN: 1940/08/17 (82 y.o. Jamie [name] Primary Care Ryleigh Buenger: [name] Other Clinician: Referring Noreene Boreman: Treating Nazar Kuan/Extender: [name], [name] in Treatment: 11 Clinic Level of Care Assessment Items TOOL 4 Quantity Score X- 1 0 Use when only an EandM is performed on FOLLOW-UP visit ASSESSMENTS - Nursing Assessment / Reassessment X- 1 10 Reassessment of Co-morbidities (includes updates in patient status) X- 1 5 Reassessment of Adherence to Treatment Plan ASSESSMENTS - Wound and Skin A ssessment / Reassessment []  - 0 Simple Wound Assessment / Reassessment - one wound X- 1 5 Complex Wound Assessment / Reassessment - multiple wounds X- 1 10 Dermatologic / Skin Assessment (not related to wound area) ASSESSMENTS - Focused Assessment X- 1 5 Circumferential Edema Measurements - multi extremities []  - 0 Nutritional Assessment / Counseling / Intervention []  - 0 Lower Extremity Assessment (monofilament, tuning fork, pulses) []  - 0 Peripheral Arterial Disease Assessment (using hand held doppler) ASSESSMENTS - Ostomy and/or Continence Assessment and Care []  - 0 Incontinence Assessment and Management []  - 0 Ostomy Care Assessment and Management (repouching, etc.) PROCESS - Coordination of Care Jamie [name], Jamie [name] ([id number]) [id number] Page 2 of 7 []  - 0 Simple Patient / Family Education for ongoing care X- 1 20 Complex (extensive) Patient / Family Education for ongoing care X- 1 10 Staff obtains Chiropractor, Records, T Results / Process Orders est X- 1 10 Staff telephones HHA, Nursing Homes / Clarify orders / etc []  - 0 Routine Transfer to another Facility (non-emergent condition) []  - 0 Routine Hospital Admission (non-emergent condition) []  - 0 New Admissions / Manufacturing engineer / Ordering NPWT Apligraf, etc. , []  - 0 Emergency Hospital Admission (emergent condition) []  - 0 Simple Discharge Coordination X- 1 15 Complex (extensive) Discharge Coordination PROCESS - Special Needs []  - 0 Pediatric / Minor Patient Management []  - 0 Isolation  Patient Management []  - 0 Hearing / Language / Visual special needs []  - 0 Assessment of Community assistance (transportation, D/C planning, etc.) []  - 0 Additional assistance / Altered mentation []  - 0 Support Surface(s) Assessment (bed, cushion, seat,  etc.) INTERVENTIONS - Wound Cleansing / Measurement []  - 0 Simple Wound Cleansing - one wound X- 1 5 Complex Wound Cleansing - multiple wounds X- 1 5 Wound Imaging (photographs - any number of wounds) []  - 0 Wound Tracing (instead of photographs) []  - 0 Simple Wound Measurement - one wound X- 1 5 Complex Wound Measurement - multiple wounds INTERVENTIONS - Wound Dressings []  - 0 Small Wound Dressing one or multiple wounds []  - 0 Medium Wound Dressing one or multiple wounds X- 1 20 Large Wound Dressing one or multiple wounds []  - 0 Application of Medications - topical []  - 0 Application of Medications - injection INTERVENTIONS - Miscellaneous []  - 0 External ear exam []  - 0 Specimen Collection (cultures, biopsies, blood, body fluids, etc.) []  - 0 Specimen(s) / Culture(s) sent or taken to Lab for analysis []  - 0 Patient Transfer (multiple staff / Nurse, adult / Similar devices) []  - 0 Simple Staple / Suture removal (25 or less) []  - 0 Complex Staple / Suture removal (26 or more) []  - 0 Hypo / Hyperglycemic Management (close monitor of Blood Glucose) []  - 0 Ankle / Brachial Index (ABI) - do not check if billed separately X- 1 5 Vital Signs Has the patient been seen at the hospital within the last three years: Yes Total Score: 130 Level Of Care: New/Established - Level 4 Electronic Signature(s) Signed: 08/07/2022 4:44:49 PM By: Shawn Stall RN, BSN Jamie [name] ([id number]) [id number] Page 3 of 7 Entered By: Shawn Stall on 08/07/2022 11:02:09 -------------------------------------------------------------------------------- Encounter Discharge Information Details Patient Name: Date  of Service: Jamie [name], [name]. 08/07/2022 10:15 A M Medical Record Number: [id number] Patient Account Number: [id number] Date of Birth/Sex: Treating RN: Aug 13, 1940 (82 y.o. Jamie [name] Primary Care Tanecia Mccay: [name] Other Clinician: Referring Jimmey Hengel: Treating Keelie Zemanek/Extender: [name], Billey Gosling in Treatment: 11 Encounter Discharge Information Items Discharge Condition: Stable Ambulatory Status: Wheelchair Discharge Destination: Home Transportation: Private Auto Accompanied By: Niece Schedule Follow-up Appointment: Yes Clinical Summary of Care: Electronic Signature(s) Signed: 08/07/2022 4:44:49 PM By: Shawn Stall RN, BSN Entered By: Shawn Stall on 08/07/2022 11:02:46 -------------------------------------------------------------------------------- Lower Extremity Assessment Details Patient Name: Date of Service: Jamie [name]. 08/07/2022 10:15 A M Medical Record Number: [id number] Patient Account Number: [id number] Date of Birth/Sex: Treating RN: 14-Aug-1940 (83 y.o. Jamie [name] Primary Care Tahir Blank: [name] Other Clinician: Referring Leelynd Maldonado: Treating Tache [name]/Extender: [name], [name] in Treatment: 11 Edema Assessment Assessed: [Left: No] [Right: Yes] Edema: [Left: Ye] [Right: s] Calf Left: Right: Point of Measurement: From Medial Instep 35 cm Ankle Left: Right: Point of Measurement: From Medial Instep 29 cm Vascular Assessment Pulses: Dorsalis Pedis Palpable: [Right:Yes] Electronic Signature(s) Signed: 08/07/2022 4:44:49 PM By: Shawn Stall RN, BSN Entered By: Shawn Stall on 08/07/2022 10:32:47 -------------------------------------------------------------------------------- Multi-Disciplinary Care Plan Details Patient Name: Date of Service: Jamie [name], [name] 08/07/2022 10:15 A M Medical Record Number: [id number] Patient Account Number:  [id number] Date of Birth/Sex: Treating RN: 1941-03-31 (82 y.o. 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 Compression as ordered Provide education on venous insufficiency Notes: Wound/Skin Impairment Nursing Diagnoses: Impaired tissue integrity Knowledge deficit related to ulceration/compromised skin integrity Goals: Patient/caregiver will verbalize understanding of skin care regimen Date Initiated: 05/22/2022 Target Resolution Date: 10/04/2022 Goal Status: Active Ulcer/skin breakdown will have a volume reduction of 30% by week 4 Date Initiated: 05/22/2022 Date Inactivated: 07/10/2022 Target Resolution Date: 07/06/2022 Unmet Reason: see wound Goal Status: Unmet measurement. Interventions: Assess patient/caregiver ability to obtain necessary supplies Assess patient/caregiver ability to perform ulcer/skin care regimen upon admission and as needed Assess ulceration(s) every visit Provide education on ulcer and skin care Notes: Electronic Signature(s) Signed: 08/07/2022 4:44:49 PM By: Shawn Stall RN, BSN Entered By: Shawn Stall on 08/07/2022  10:37:27 -------------------------------------------------------------------------------- Pain Assessment Details Patient Name: Date of Service: Jamie Mart W. 08/07/2022 10:15 A M Medical Record Number: [id number] Patient Account Number: [id number] Date of Birth/Sex: Treating RN: [birth year] (82 y.o. Jamie [name] Primary Care Gina Leblond: [name] Other Clinician: Referring Poseidon Pam: Treating Yoniel Arkwright/Extender: [name], [name] in Treatment: 11 Active Problems Location of Pain Severity and Description of Pain Patient Has Paino Yes Site Locations Pain Location: Jamie [name], Jamie [name] ([id number]) [id number] Page 5 of 7 Pain Location: Pain in Ulcers Rate the pain. Current Pain Level: 10 Pain Management and Medication Current Pain Management: Medication: No Cold Application: No Rest: No Massage: No Activity: No T.E.N.S.: No Heat Application: No Leg drop or elevation: No Is the Current Pain Management Adequate: Adequate How does your wound impact your activities of daily livingo Sleep: No Bathing: No Appetite: No Relationship With Others: No Bladder Continence: No Emotions: No Bowel Continence: No Work: No Toileting: No Drive: No Dressing: No Hobbies: No Psychologist, prison and probation services) Signed: 08/07/2022 4:44:49 PM By: Shawn Stall RN, BSN Entered By: Shawn Stall on 08/07/2022 10:32:36 -------------------------------------------------------------------------------- Patient/Caregiver Education Details Patient Name: Date of Service: Jamie [name] 5/1/2024andnbsp10:15 A M Medical Record Number: [id number] Patient Account Number: [id number] Date of Birth/Gender: Treating RN: Sep 02, 1940 (82 y.o. Jamie [name] Primary Care Physician: [name] Other Clinician: Referring Physician: Treating Physician/Extender: [name], Billey Gosling in Treatment:  11 Education Assessment Education Provided To: Patient Education Topics Provided Wound/Skin Impairment: Handouts: Caring for Your Ulcer Methods: Explain/Verbal Responses: Reinforcements needed Electronic Signature(s) Signed: 08/07/2022 4:44:49 PM By: Shawn Stall RN, BSN Entered By: Shawn Stall on 08/07/2022 10:37:37 -------------------------------------------------------------------------------- Wound Assessment Details Patient Name: Date of Service: Jamie [name], [name] 08/07/2022 10:15 A Desma Maxim ([id number]) [id number] Page 6 of 7 Medical Record Number: [id number] Patient Account Number: [id number] Date of Birth/Sex: Treating RN: 10/11/1940 (82 y.o. Jamie [name], Jamie [name] Primary Care Alane Hanssen: [name] Other Clinician: Referring Can Lucci: Treating Anthoney Sheppard/Extender: [name], [name] in Treatment: 11 Wound Status Wound Number: 5 Primary Venous Leg Ulcer Etiology: Wound Location: Right, Lateral Lower Leg Wound Open Wounding Event: Trauma Status: Date Acquired: 04/17/2022 Comorbid Sleep Apnea, Hypertension, Peripheral Venous Disease, Weeks Of Treatment: 11 History: Osteoarthritis, Neuropathy Clustered Wound: No Photos Wound Measurements Length: (cm) 13.2 Width: (cm) 6.5 Depth: (cm) 0.2 Area: (cm) 67.387 Volume: (cm) 13.477 % Reduction in Area: -157.5% % Reduction in Volume: -71.7% Epithelialization: None Tunneling: No Undermining: No Wound Description Classification: Full Thickness Without Exposed Suppor Wound Margin: Distinct, outline attached Exudate Amount: Large Exudate Type: Serosanguineous Exudate Color: red, brown t Structures Foul Odor After Cleansing: No Slough/Fibrino Yes  Wound Bed Granulation Amount: Large (67-100%) Exposed Structure Granulation Quality: Red, Pink, Hyper-granulation Fascia Exposed: No Necrotic Amount: Small (1-33%) Fat Layer (Subcutaneous  Tissue) Exposed: Yes Necrotic Quality: Adherent Slough Tendon Exposed: No Muscle Exposed: No Joint Exposed: No Bone Exposed: No Periwound Skin Texture Texture Color No Abnormalities Noted: No No Abnormalities Noted: No Callus: No Atrophie Blanche: No Crepitus: No Cyanosis: No Excoriation: No Ecchymosis: No Induration: No Erythema: No Rash: No Hemosiderin Staining: Yes Scarring: Yes Mottled: No Pallor: No Moisture Rubor: No No Abnormalities Noted: No Dry / Scaly: No Temperature / Pain Maceration: Yes Tenderness on Palpation: Yes Treatment Notes Wound #5 (Lower Leg) Wound Laterality: Right, Lateral Cleanser Soap and Water Discharge Instruction: May shower and wash wound with dial antibacterial soap and water prior to dressing change. Jamie [name], Jamie [name] ([id number]) [id number] Page 7 of 7 Peri-Wound Care Sween Lotion (Moisturizing lotion) Discharge Instruction: Apply moisturizing lotion as directed Topical Primary Dressing Hydrofera Blue Ready Transfer Foam, 4x5 (in/in) Discharge Instruction: Apply to wound bed as instructed Secondary Dressing ABD Pad, 8x10 Discharge Instruction: Apply over primary dressing as directed. CarboFLEX Odor Control Dressing, 4x4 in Discharge Instruction: Apply over primary dressing as directed. Woven Gauze Sponge, Non-Sterile 4x4 in Discharge Instruction: Apply over primary dressing as directed. Zetuvit Plus 4x8 in Discharge Instruction: Apply over primary dressing as directed. Secured With [employer], 4.5x3.1 (in/yd) Discharge Instruction: Secure with Kerlix as directed. Compression Wrap tubigrip size D one layer Discharge Instruction: apply over the right leg. Change with dressing changes. Compression Stockings Add-Ons Electronic Signature(s) Signed: 08/07/2022 4:44:49 PM By: Shawn Stall RN, BSN Entered By: Shawn Stall on 08/07/2022  10:35:31 -------------------------------------------------------------------------------- Vitals Details Patient Name: Date of Service: Jamie Brink, [name] 08/07/2022 10:15 A M Medical Record Number: [id number] Patient Account Number: [id number] Date of Birth/Sex: Treating RN: [birth year] (82 y.o. Jamie [name] Primary Care Jazyiah Yiu: [name] Other Clinician: Referring Saavi Mceachron: Treating Dosha Broshears/Extender: [name], [name] in Treatment: 11 Vital Signs Time Taken: 10:30 Temperature (F): 98.6 Height (in): 62 Pulse (bpm): 91 Weight (lbs): 171 Respiratory Rate (breaths/min): 20 Body Mass Index (BMI): 31.3 Blood Pressure (mmHg): 133/89 Reference Range: 80 - 120 mg / dl Electronic Signature(s) Signed: 08/07/2022 4:44:49 PM By: Shawn Stall RN, BSN Entered By: Shawn Stall on 08/07/2022 10:32:27

## 2022-08-14 ENCOUNTER — Encounter (HOSPITAL_BASED_OUTPATIENT_CLINIC_OR_DEPARTMENT_OTHER): Payer: Medicare HMO | Admitting: Physician Assistant

## 2022-08-14 DIAGNOSIS — I87331 Chronic venous hypertension (idiopathic) with ulcer and inflammation of right lower extremity: Secondary | ICD-10-CM | POA: Diagnosis not present

## 2022-08-14 NOTE — Progress Notes (Signed)
Jamie [name], Jamie [name] ([id number]) [id number] Page 1 of 8 Visit Report for 08/14/2022 Chief Complaint Document Details Patient Name: Date of Service: Jamie [name], [name]. 08/14/2022 11:00 A M Medical Record Number: [id number] Patient Account Number: [id number] Date of Birth/Sex: Treating RN: 05/06/1940 (82 y.o. F) Primary Care Provider: Delorise [name] Other Clinician: Referring Provider: Treating Provider/Extender: Jamie [name], Jamie [name] in Treatment: 12 Information Obtained from: Patient Chief Complaint Bilateral LE Ulcers Electronic Signature(s) Signed: 08/14/2022 10:29:04 AM By: Jamie Derry PA-C Entered By: Jamie [name] on 08/14/2022 10:29:03 -------------------------------------------------------------------------------- HPI Details Patient Name: Date of Service: Jamie [name], [name] 08/14/2022 11:00 A M Medical Record Number: [id number] Patient Account Number: [id number] Date of Birth/Sex: Treating RN: [birth year] (82 y.o. F) Primary Care Provider: Delorise [name] Other Clinician: Referring Provider: Treating Provider/Extender: Jamie [name], Jamie [name] in Treatment: 12 History of Present Illness HPI Description: ADMISSION 06/30/2020 Jamie [name] is an 82 year old woman who lives in [place]. She is here with her niece for review of wounds on the left medial lower leg and ankle. These have apparently been present for over a year and she followed with Jamie [name] at the [facility] in [place] for quite a period of time although it looks as though there was a initial consult wound from Jamie [name] on February 18 presumably there was therefore hiatus. At that point the wounds were described as being there for 3 months. She also tells me she was at the wound care center in [place] for a period of time with this. 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 She had venous studies that were negative for DVT ABIs on the right were 1.01 on the left 1.06. She has . had previous applications of puraply, compression which she does not tolerate very well. She has had several rounds of oral antibiotic therapy. She complains of unrelenting pain and she is seeing Jamie [name] of pain management apparently was on oxycodone but that did not help. She also had a skin biopsy done by Jamie [name] although we do not have that result. She does not appear to have an arterial issue. I am not completely clear what she has been putting on the wounds lately. 3/31; this patient has a particularly nasty set of wounds on the left medial ankle in the middle of what looks to be hemosiderin deposition secondary to chronic venous insufficiency. She has a lot of pain followed by pain management. Jamie [name] apparently did a biopsy of something on the left leg last fall what I would like to get this result. She has a history of MRSA treatment. I do not believe she had reflux studies but she did have DVT rule out studies. Previous ABIs have not suggested arterial insufficiency 4/7; difficult wounds on the left medial ankle probably chronic venous insufficiency. With considerable effort on behalf of our case manager we were able to finally to speak to somebody at the hospital in [place] who indicated that no biopsy of this area have been done even though the patient describes this in some detail and is even able to point out where she thinks the biopsy was done. We have been using Sorbact. The PCR culture I did showed polymicrobial identification with Pseudomonas, staph aureus, Peptostreptococcus. All of this and low titers. Resistance genes identified were MRSA, staph virulence gene and tetracycline. We are going to send this to [facility] for a topical antibiotic which is something  that we have had good success with recently in large venous ulcers with a lot of purulent  drainage. There would not be an easy oral alternative here possibly line escalated and ciprofloxacin if we need to use systemic antibiotic 4/15; difficult area on the left medial ankle. Most likely chronic venous insufficiency. I think she will probably need venous reflux study I think she had DVT rule outs but not venous reflux studies. We have not yet obtained the topical antibiotics. She has home health changing the dressing we have been using Sorbact for adherent fibrinous debris on the surface. Very difficult to remove 4/22; patient presents for 1 week follow-up. She has been using sore back under compression wraps and these are changed 3 times a week with home health. She also had [facility] antibiotics sent to her house and brought them in today. She has no complaints or issues today. 5/2; patient is here for follow-up. She has been using Sorbact under compression. Very painful wound. She has been using [facility] antibiotics. Not much improvement although the more medial part of the wound has cleaned up nicely and the larger part of the wound about 50% slough covered. Part of the issue here is that she had stays at both [facility] wound care center, [facility] wound care center and now [place]. Not sure if she has had venous reflux studies. As far as we are able to tell she did not have a biopsy. My notes state that she did not have venous reflux studies just DVT rule outs. 5/16; patient goes for venous reflux studies this afternoon. She says that wound was biopsied which sounds like punch biopsies by Dr. Lynden [name] we do not have these results. We are using Sorbact. Very difficult wound to debride 5/23; patient presents for 1 week follow-up. She reports tolerating the wraps well with sorbact underneath. She had ABIs and venous reflux studies done. She Jamie [name], Jamie [name] ([id number]) [id number] Page 2 of 8 has no issues or complaints today. She denies acute signs of infection. 6/6;  I have reviewed the patient's vascular studies. Wounds are on the left medial and posterior calf. She had significant reflux in the greater saphenous vein in the in the mid thigh, distal thigh knee and the small saphenous vein in the popliteal fossa. The vein diameters do not look too impressive though. She is going to see the vascular surgeon on Wednesday. She also had venous reflux in the right common femoral vein. She did not have any evidence of a DVT or SVT I am wondering whether there is an ablation procedure that would benefit her in the greater saphenous vein on the left. . She tells [place] that home health put the dressing on too tight and she took off 1 layer. The swelling in her left leg is a little worse as a result of this. Not much change in the wound measurements so the surface of the wound looks better Her arterial studies showed an ABI on the right of 1.14 with a triphasic waveform and a great toe pressure of 0.89. On the left her ABIs were noncompressible at 1.34 but with triphasic waveforms and a TBI of 0.97. Her greater toe pressure was 122. There was no evidence of significant bilateral arterial disease 6/20 patient went to see Dr. Durwin Nora. He did not think she had significant arterial disease. In terms of her venous duplex on the right side there was no evidence of a DVT or SVT there was deep venous reflux involving the common  femoral vein no superficial vein reflux on the left side there was no DVT or SVT there was no deep vein graft reflux there was some reflux in the greater saphenous vein from the mid thigh to the knee but the vein here was not dilated. He thought these were venous wounds he prescribed a compression pump but I am not sure who we ordered it from The patient has been approved for Apligraf. Still using silver collagen this week 7/5; Apligraf #1 7/19 Apligraf #2. Decent improvement in the condition of the wound bed. Epithelialization distal 8/2 Apligraf #3. No issues  or complaints. Denies signs of infection. 8/17 Apligraf #4. No issues or concerns. Some complaints of pruritus and the rash. Our intake nurse brought up the fact that she had previously indicated possible cotton layer sensitivity we will therefore use kerlix in the bottom layer of the compression 8/30; the patient comes in with the area on her left medial leg just about healed. There is a superficial area more towards the tibia and a smaller open area distally everything else is epithelialized. I do not think she requires another Apligraf 9/13; left medial leg is healed. 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 Her edema control is good Readmisstion: 05-22-2022 upon evaluation today patient presents for initial inspection here in our clinic concerning issues that she has been having with a wound of the right lateral lower extremity. This is an area of a previous skin graft she tells me. With that being said she unfortunately had a scrape on this that occurred around 10 January. Since that time she has noted that this has just continued to get bigger and bigger in her niece who is present with her today actually states that 2 [name] ago when she saw that it was significantly smaller than what she sees currently. Obviously this is of utmost concern as we do not want this to continue to get larger when arrested and get moving in the right direction. Fortunately there does not appear to be any signs of systemic infection though locally I think we probably do have some infection present. I would obtain a culture to see what we have going on here and then we will subsequently see where things go going forward. Fortunately I think that she is in the right place at this point being at the wound center we can definitely do something to try to get this moving in the right direction. Patient does have a history of chronic venous insufficiency as well as  coronary artery disease. In the past she has done well with compression wraps on the start with a 3 layer wrap I think she is probably can end up going to a 4-layer at some point but we will see how things do over the next week. She has been using wound cleanser along with she tells me a zinc and topical but again I am not sure exactly what that was. 05-29-2022 upon evaluation today patient appears to be doing well currently in regard to her wound. This actually is showing signs of improvement I am happy in that regard unfortunately her left leg has an area on the shin that has opened. This is the region where one of the areas at least we have previously taken care of. Nonetheless this is small hoping we get it under control before things worsen significantly. 06-05-2022 upon evaluation today patient appears to be doing well currently in regard  to her wounds. The actually seem to be fairly clean she is having still quite a bit of problem with pain at this point she would like to not have debridement today for all possible. With that being said I do believe that we are making some good progress I think the [facility] is doing a decent job here. 3/6; patient has had 3/6; patient with known severe chronic venous insufficiency. She apparently had a traumatic wound at home and has a reasonably substantial wound on the right lateral lower leg and a smaller one on the left anterior lower leg as well. We have been using Hydrofera Blue and Unna boots 3/13; patient presents for follow-up. We have been using Hydrofera Blue under Coflex to the legs bilaterally. She has no issues or complaints today. 06-26-2022 upon evaluation today patient appears to be doing well currently in regard to her wound. This is measuring a little bit larger however. Fortunately I do not see any signs of infection this is on the right side. Fortunately the left side is actually completely healed which is great news. 07-03-2022 patient's  wound unfortunately is continuing to show signs of being worse. Her compression which was the Tubigrip that I thought should be able to pull up actually slipped down and she was not able to pull that back up. With that being said that means that unfortunately her wound has continued to deteriorate since I last saw her. This is definitely not the direction that we are looking for. I discussed with her that I do believe we need to go ahead and see about getting her started on antibiotics and I subsequently would also like to go ahead and see about getting things moving forward with regard to a wound culture and making a change up her dressings as well but this can be changed more frequently than just once a week. 07-10-2022 upon evaluation today patient actually appears to be doing much better. I do believe that the antibiotics have been beneficial for her. Fortunately I do not see any signs of active infection locally nor systemically which is great news. No fevers, chills, nausea, vomiting, or diarrhea. 07-17-2022 upon evaluation today patient appears to be doing well currently in regard to her wound which is actually showing signs of improvement this is slow but nonetheless prevalent that we are seeing good improvement here. Fortunately I do not see any signs of active infection locally nor systemically which is great news. 07-24-2022 upon evaluation today patient appears to be doing well currently in regard to her wound although the wrap that we had on has been slipping down and she really does not have anybody to help her with this at home health is not going to be coming out we could not get anybody that would actually be able to do the dressing changes. Fortunately I do not see any signs of active infection locally nor systemically which is great news. 08-07-22 poorly in regard to her leg compared to what it was previous. Fortunately there does not appear to be any signs of active infection locally  nor systemically which is great news. No fevers, chills, nausea, vomiting, or diarrhea. With that being said it does appear that the patient may have some cellulitis in the leg which is not good. 08-14-2022 upon evaluation today patient appears to be doing somewhat better in regard to her wounds she still is having quite a bit of pain we are still not where we want to be as  far as healing is concerned completely. Fortunately I do not see any evidence of active infection locally nor systemically which is great news and I am very pleased in that regard. Electronic Signature(s) Signed: 08/14/2022 1:44:55 PM By: Jamie Derry PA-C Entered By: Jamie [name] on 08/14/2022 13:44:55 Jamie [name] ([id number]) [id number] Page 3 of 8 -------------------------------------------------------------------------------- Physical Exam Details Patient Name: Date of Service: Jamie [name], [name]. 08/14/2022 11:00 A M Medical Record Number: [id number] Patient Account Number: [id number] Date of Birth/Sex: Treating RN: [birth year] (82 y.o. F) Primary Care Provider: Delorise [name] Other Clinician: Referring Provider: Treating Provider/Extender: Jamie [name], Jamie [name] in Treatment: 12 Constitutional Obese and well-hydrated in no acute distress. Respiratory normal breathing without difficulty. Psychiatric this patient is able to make decisions and demonstrates good insight into disease process. Alert and Oriented x 3. pleasant and cooperative. Notes Upon inspection I did not perform debridement due to the tremendous amount of pain that the patient was experiencing I did avoid this currently. With that being said I do believe that sharp debridement would likely be something that would be necessary at some point in the future although we will see how things play out and again right now I am avoiding it altogether due to the discomfort that she is having  in general the wound is very painful. We do need to try to get some compression therapy going I think a light Kerlix and Coban wrap to be a good starting place. Electronic Signature(s) Signed: 08/14/2022 1:45:27 PM By: Jamie Derry PA-C Entered By: Jamie [name] on 08/14/2022 13:45:26 -------------------------------------------------------------------------------- Physician Orders Details Patient Name: Date of Service: Jamie [name], [name] 08/14/2022 11:00 A M Medical Record Number: [id number] Patient Account Number: [id number] Date of Birth/Sex: Treating RN: 01/18/1941 (82 y.o. [name] Primary Care Provider: Delorise [name] Other Clinician: Referring Provider: Treating Provider/Extender: Jamie [name], Jamie [name] in Treatment: 12 Verbal / Phone Orders: No Diagnosis Coding ICD-10 Coding Code Description I87.331 Chronic venous hypertension (idiopathic) with ulcer and inflammation of right lower extremity 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d L97.822 Non-pressure chronic ulcer of other part of left lower leg with fat layer exposed I25.10 Atherosclerotic heart disease of native coronary artery without angina pectoris Follow-up Appointments ppointment in 1 week. Leonard Schwartz Wednesday 08/21/2022 1015 room 8 Return A ppointment in 2 [name]. Leonard Schwartz Wednesday 08/28/2022 1015 room 8 Return A Return appointment in 3 [name]. Leonard Schwartz Wednesday 09/04/2022 1015 room 8 Other: - home health to wash right leg and foot with soap (dial soap) and water with dressing changes. Patient to have soap and towels next to her when you arrive. Anesthetic Wound #5 Right,Lateral Lower Leg (In clinic) Topical Lidocaine 5% applied to wound bed (In clinic) Topical Lidocaine 4% applied to wound bed Bathing/ Shower/ Hygiene May shower and wash wound with soap and water. - with dressing changes. Edema Control - Lymphedema / SCD /  Other Right Lower Extremity Elevate legs to the level of the heart or above for 30 minutes daily and/or when sitting for 3-4 times a day throughout the day. Avoid standing for long periods of time. Exercise regularly Home Health Wound #5 Right,Lateral Lower Leg New wound care orders this week; continue Home Health for wound care. May utilize formulary equivalent dressing for wound treatment [name], ABBOT ([id number]) [id number] Page 4 of 8 orders unless otherwise specified. - Home health to change twice a week and  wound center weekly. Please wash leg and foot with soap (dial soap) and water. adding zinc oxide to periwound. Changing compression- tubigrip to kerlix and coban. Please wrap from just above toes to just below knee. Place a mark to wear compresson should start and stop. Other Home Health Orders/Instructions: - [facility] in [place], [place] fax # [phone]. Wound Treatment Wound #5 - Lower Leg Wound Laterality: Right, Lateral Cleanser: Soap and Water 3 x Per Week/30 Days Discharge Instructions: May shower and wash wound with dial antibacterial soap and water prior to dressing change. Peri-Wound Care: Zinc Oxide Ointment 30g tube 3 x Per Week/30 Days Discharge Instructions: Apply Zinc Oxide to periwound with each dressing change Peri-Wound Care: Sween Lotion (Moisturizing lotion) 3 x Per Week/30 Days Discharge Instructions: Apply moisturizing lotion as directed Prim Dressing: Hydrofera Blue Ready Transfer Foam, 4x5 (in/in) (Generic) 3 x Per Week/30 Days ary Discharge Instructions: Apply to wound bed as instructed Secondary Dressing: ABD Pad, 8x10 3 x Per Week/30 Days Discharge Instructions: Apply over primary dressing as directed. Secondary Dressing: Woven Gauze Sponge, Non-Sterile 4x4 in 3 x Per Week/30 Days Discharge Instructions: Apply over primary dressing as directed. Secondary Dressing: Zetuvit Plus 4x8 in 3 x Per Week/30  Days Discharge Instructions: Or double ABD pad Apply over primary dressing as directed. Compression Wrap: Kerlix Roll 4.5x3.1 (in/yd) 3 x Per Week/30 Days Discharge Instructions: Apply Kerlix and Coban compression as directed. Compression Wrap: Coban Self-Adherent Wrap 4x5 (in/yd) 3 x Per Week/30 Days Discharge Instructions: Apply over Kerlix as directed. Electronic Signature(s) Signed: 08/14/2022 11:31:33 AM By: Shawn Stall RN, BSN Signed: 08/14/2022 4:34:38 PM By: Jamie Derry PA-C Entered By: Shawn Stall on 08/14/2022 10:35:53 -------------------------------------------------------------------------------- Problem List Details Patient Name: Date of Service: Jamie [name], [name] 08/14/2022 11:00 A M Medical Record Number: [id number] Patient Account Number: [id number] Date of Birth/Sex: Treating RN: 02/07/1941 (82 y.o. Debara Pickett, Yvonne Kendall Primary Care Provider: Delorise [name] Other Clinician: Referring Provider: Treating Provider/Extender: Jamie [name], Jamie [name] in Treatment: 12 Active Problems ICD-10 Encounter Code Description Active Date MDM Diagnosis I87.331 Chronic venous hypertension (idiopathic) with ulcer and inflammation of right 05/22/2022 No Yes lower extremity L97.812 Non-pressure chronic ulcer of other part of right lower leg with fat layer 05/22/2022 No Yes exposed L97.822 Non-pressure chronic ulcer of other part of left lower leg with fat layer exposed2/21/2024 No Yes I25.10 Atherosclerotic heart disease of native coronary artery without angina pectoris 05/22/2022 No Yes Jamie [name], Jamie [name] ([id number]) [id number] Page 5 of 8 Inactive Problems Resolved Problems Electronic Signature(s) Signed: 08/14/2022 10:28:52 AM By: Jamie Derry PA-C Entered By: Jamie [name] on 08/14/2022 10:28:52 -------------------------------------------------------------------------------- Progress Note Details Patient Name: Date of  Service: Jamie Mart W. 08/14/2022 11:00 A M Medical Record Number: [id number] Patient Account Number: [id number] Date of Birth/Sex: Treating RN: [birth year] (82 y.o. F) Primary Care Provider: Delorise [name] Other Clinician: Referring Provider: Treating Provider/Extender: Jamie [name], Jamie [name] in Treatment: 12 Subjective Chief Complaint Information obtained from Patient Bilateral LE Ulcers History of Present Illness (HPI) ADMISSION 06/30/2020 Mrs. Polt is an 82 year old woman who lives in [place]. She is here with her niece for review of wounds on the left medial lower leg and ankle. These have apparently been present for over a year and she followed with Jamie [name] at the [facility] in [place] for quite a period of time although it looks as though there was a initial consult wound from Jamie [name] on February 18 presumably there  was therefore hiatus. At that point the wounds were described as being there for 3 months. She also tells me she was at the wound care center in [place] for a period of time with this. 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 She had venous studies that were negative for DVT ABIs on the right were 1.01 on the left 1.06. She has . had previous applications of puraply, compression which she does not tolerate very well. She has had several rounds of oral antibiotic therapy. She complains of unrelenting pain and she is seeing Jamie [name] of pain management apparently was on oxycodone but that did not help. She also had a skin biopsy done by Jamie [name] although we do not have that result. She does not appear to have an arterial issue. I am not completely clear what she has been putting on the wounds lately. 3/31; this patient has a particularly nasty set of wounds on the left medial ankle in the middle of what looks to be hemosiderin deposition secondary to  chronic venous insufficiency. She has a lot of pain followed by pain management. Jamie [name] apparently did a biopsy of something on the left leg last fall what I would like to get this result. She has a history of MRSA treatment. I do not believe she had reflux studies but she did have DVT rule out studies. Previous ABIs have not suggested arterial insufficiency 4/7; difficult wounds on the left medial ankle probably chronic venous insufficiency. With considerable effort on behalf of our case manager we were able to finally to speak to somebody at the hospital in [place] who indicated that no biopsy of this area have been done even though the patient describes this in some detail and is even able to point out where she thinks the biopsy was done. We have been using Sorbact. The PCR culture I did showed polymicrobial identification with Pseudomonas, staph aureus, Peptostreptococcus. All of this and low titers. Resistance genes identified were MRSA, staph virulence gene and tetracycline. We are going to send this to [facility] for a topical antibiotic which is something that we have had good success with recently in large venous ulcers with a lot of purulent drainage. There would not be an easy oral alternative here possibly line escalated and ciprofloxacin if we need to use systemic antibiotic 4/15; difficult area on the left medial ankle. Most likely chronic venous insufficiency. I think she will probably need venous reflux study I think she had DVT rule outs but not venous reflux studies. We have not yet obtained the topical antibiotics. She has home health changing the dressing we have been using Sorbact for adherent fibrinous debris on the surface. Very difficult to remove 4/22; patient presents for 1 week follow-up. She has been using sore back under compression wraps and these are changed 3 times a week with home health. She also had [facility] antibiotics sent to her house and brought them in today.  She has no complaints or issues today. 5/2; patient is here for follow-up. She has been using Sorbact under compression. Very painful wound. She has been using [facility] antibiotics. Not much improvement although the more medial part of the wound has cleaned up nicely and the larger part of the wound about 50% slough covered. Part of the issue here is that she had stays at both [facility] wound care center, [facility] wound care center and now [place]. Not sure if she has had venous  reflux studies. As far as we are able to tell she did not have a biopsy. My notes state that she did not have venous reflux studies just DVT rule outs. 5/16; patient goes for venous reflux studies this afternoon. She says that wound was biopsied which sounds like punch biopsies by Dr. Lynden [name] we do not have these results. We are using Sorbact. Very difficult wound to debride 5/23; patient presents for 1 week follow-up. She reports tolerating the wraps well with sorbact underneath. She had ABIs and venous reflux studies done. She has no issues or complaints today. She denies acute signs of infection. 6/6; I have reviewed the patient's vascular studies. Wounds are on the left medial and posterior calf. She had significant reflux in the greater saphenous vein in the in the mid thigh, distal thigh knee and the small saphenous vein in the popliteal fossa. The vein diameters do not look too impressive though. She is going to see the vascular surgeon on Wednesday. She also had venous reflux in the right common femoral vein. She did not have any evidence of a DVT or SVT I am wondering whether there is an ablation procedure that would benefit her in the greater saphenous vein on the left. . She tells [place] that home health put the dressing on too tight and she took off 1 layer. The swelling in her left leg is a little worse as a result of this. Not much change in the wound measurements so the surface of the wound looks better Her arterial  studies showed an ABI on the right of 1.14 with a triphasic waveform and a great toe pressure of 0.89. On the left her ABIs were noncompressible at 1.34 but with triphasic waveforms and a TBI of 0.97. Her greater toe pressure was 122. There was no evidence of significant bilateral arterial disease 6/20 patient went to see Dr. Durwin Nora. He did not think she had significant arterial disease. In terms of her venous duplex on the right side there was no evidence of a DVT or SVT there was deep venous reflux involving the common femoral vein no superficial vein reflux on the left side there was no DVT or SVT there was no deep vein graft reflux there was some reflux in the greater saphenous vein from the mid thigh to the knee but the vein here was not dilated. He thought Jamie [name], Jamie [name] ([id number]) [id number] Page 6 of 8 these were venous wounds he prescribed a compression pump but I am not sure who we ordered it from The patient has been approved for Apligraf. Still using silver collagen this week 7/5; Apligraf #1 7/19 Apligraf #2. Decent improvement in the condition of the wound bed. Epithelialization distal 8/2 Apligraf #3. No issues or complaints. Denies signs of infection. 8/17 Apligraf #4. No issues or concerns. Some complaints of pruritus and the rash. Our intake nurse brought up the fact that she had previously indicated possible cotton layer sensitivity we will therefore use kerlix in the bottom layer of the compression 8/30; the patient comes in with the area on her left medial leg just about healed. There is a superficial area more towards the tibia and a smaller open area distally everything else is epithelialized. I do not think she requires another Apligraf 9/13; left medial leg is healed. 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 Her edema control is good Readmisstion: 05-22-2022 upon evaluation today  patient presents for initial inspection  here in our clinic concerning issues that she has been having with a wound of the right lateral lower extremity. This is an area of a previous skin graft she tells me. With that being said she unfortunately had a scrape on this that occurred around 10 January. Since that time she has noted that this has just continued to get bigger and bigger in her niece who is present with her today actually states that 2 [name] ago when she saw that it was significantly smaller than what she sees currently. Obviously this is of utmost concern as we do not want this to continue to get larger when arrested and get moving in the right direction. Fortunately there does not appear to be any signs of systemic infection though locally I think we probably do have some infection present. I would obtain a culture to see what we have going on here and then we will subsequently see where things go going forward. Fortunately I think that she is in the right place at this point being at the wound center we can definitely do something to try to get this moving in the right direction. Patient does have a history of chronic venous insufficiency as well as coronary artery disease. In the past she has done well with compression wraps on the start with a 3 layer wrap I think she is probably can end up going to a 4-layer at some point but we will see how things do over the next week. She has been using wound cleanser along with she tells me a zinc and topical but again I am not sure exactly what that was. 05-29-2022 upon evaluation today patient appears to be doing well currently in regard to her wound. This actually is showing signs of improvement I am happy in that regard unfortunately her left leg has an area on the shin that has opened. This is the region where one of the areas at least we have previously taken care of. Nonetheless this is small hoping we get it under control before things worsen  significantly. 06-05-2022 upon evaluation today patient appears to be doing well currently in regard to her wounds. The actually seem to be fairly clean she is having still quite a bit of problem with pain at this point she would like to not have debridement today for all possible. With that being said I do believe that we are making some good progress I think the [facility] is doing a decent job here. 3/6; patient has had 3/6; patient with known severe chronic venous insufficiency. She apparently had a traumatic wound at home and has a reasonably substantial wound on the right lateral lower leg and a smaller one on the left anterior lower leg as well. We have been using Hydrofera Blue and Unna boots 3/13; patient presents for follow-up. We have been using Hydrofera Blue under Coflex to the legs bilaterally. She has no issues or complaints today. 06-26-2022 upon evaluation today patient appears to be doing well currently in regard to her wound. This is measuring a little bit larger however. Fortunately I do not see any signs of infection this is on the right side. Fortunately the left side is actually completely healed which is great news. 07-03-2022 patient's wound unfortunately is continuing to show signs of being worse. Her compression which was the Tubigrip that I thought should be able to pull up actually slipped down and she was not able to pull that back up. With that being  said that means that unfortunately her wound has continued to deteriorate since I last saw her. This is definitely not the direction that we are looking for. I discussed with her that I do believe we need to go ahead and see about getting her started on antibiotics and I subsequently would also like to go ahead and see about getting things moving forward with regard to a wound culture and making a change up her dressings as well but this can be changed more frequently than just once a week. 07-10-2022 upon evaluation today  patient actually appears to be doing much better. I do believe that the antibiotics have been beneficial for her. Fortunately I do not see any signs of active infection locally nor systemically which is great news. No fevers, chills, nausea, vomiting, or diarrhea. 07-17-2022 upon evaluation today patient appears to be doing well currently in regard to her wound which is actually showing signs of improvement this is slow but nonetheless prevalent that we are seeing good improvement here. Fortunately I do not see any signs of active infection locally nor systemically which is great news. 07-24-2022 upon evaluation today patient appears to be doing well currently in regard to her wound although the wrap that we had on has been slipping down and she really does not have anybody to help her with this at home health is not going to be coming out we could not get anybody that would actually be able to do the dressing changes. Fortunately I do not see any signs of active infection locally nor systemically which is great news. 08-07-22 poorly in regard to her leg compared to what it was previous. Fortunately there does not appear to be any signs of active infection locally nor systemically which is great news. No fevers, chills, nausea, vomiting, or diarrhea. With that being said it does appear that the patient may have some cellulitis in the leg which is not good. 08-14-2022 upon evaluation today patient appears to be doing somewhat better in regard to her wounds she still is having quite a bit of pain we are still not where we want to be as far as healing is concerned completely. Fortunately I do not see any evidence of active infection locally nor systemically which is great news and I am very pleased in that regard. Objective Constitutional Obese and well-hydrated in no acute distress. Vitals Time Taken: 10:20 AM, Height: 62 in, Weight: 171 lbs, BMI: 31.3, Temperature: 98.3 F, Pulse: 70 bpm, Respiratory  Rate: 20 breaths/min, Blood Pressure: 144/85 mmHg. Respiratory normal breathing without difficulty. Psychiatric this patient is able to make decisions and demonstrates good insight into disease process. Alert and Oriented x 3. pleasant and cooperative. Jamie [name], Jamie [name] ([id number]) [id number] Page 7 of 8 General Notes: Upon inspection I did not perform debridement due to the tremendous amount of pain that the patient was experiencing I did avoid this currently. With that being said I do believe that sharp debridement would likely be something that would be necessary at some point in the future although we will see how things play out and again right now I am avoiding it altogether due to the discomfort that she is having in general the wound is very painful. We do need to try to get some compression therapy going I think a light Kerlix and Coban wrap to be a good starting place. Integumentary (Hair, Skin) Wound #5 status is Open. Original cause of wound was Trauma. The date acquired was: 04/17/2022. The  wound has been in treatment 12 [name]. The wound is located on the Right,Lateral Lower Leg. The wound measures 13cm length x 7.2cm width x 0.2cm depth; 73.513cm^2 area and 14.703cm^3 volume. There is Fat Layer (Subcutaneous Tissue) exposed. There is no tunneling or undermining noted. There is a large amount of serosanguineous drainage noted. The wound margin is distinct with the outline attached to the wound base. There is large (67-100%) red, pink, hyper - granulation within the wound bed. There is a small (1- 33%) amount of necrotic tissue within the wound bed including Adherent Slough. The periwound skin appearance exhibited: Scarring, Maceration, Hemosiderin Staining. The periwound skin appearance did not exhibit: Callus, Crepitus, Excoriation, Induration, Rash, Dry/Scaly, Atrophie Blanche, Cyanosis, Ecchymosis, Mottled, Pallor, Rubor, Erythema. The periwound has  tenderness on palpation. 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 Leonard Schwartz Wednesday 08/21/2022 1015 room 8 Return Appointment in 2 [name]. Leonard Schwartz Wednesday 08/28/2022 1015 room 8 Return appointment in 3 [name]. Leonard Schwartz Wednesday 09/04/2022 1015 room 8 Other: - home health to wash right leg and foot with soap (dial soap) and water with dressing changes. Patient to have soap and towels next to her when you arrive. Anesthetic: Wound #5 Right,Lateral Lower Leg: (In clinic) Topical Lidocaine 5% applied to wound bed (In clinic) Topical Lidocaine 4% applied to wound bed Bathing/ Shower/ Hygiene: May shower and wash wound with soap and water. - with dressing changes. Edema Control - Lymphedema / SCD / Other: Elevate legs to the level of the heart or above for 30 minutes daily and/or when sitting for 3-4 times a day throughout the day. Avoid standing for long periods of time. Exercise regularly Home Health: Wound #5 Right,Lateral Lower Leg: New wound care orders this week; continue Home Health for wound care. May utilize formulary equivalent dressing for wound treatment orders unless otherwise specified. - Home health to change twice a week and wound center weekly. Please wash leg and foot with soap (dial soap) and water. adding zinc oxide to periwound. Changing compression- tubigrip to kerlix and coban. Please wrap from just above toes to just below knee. Place a mark to wear compresson should start and stop. Other Home Health Orders/Instructions: - [facility] in [place], [place] fax #  [phone]. WOUND #5: - Lower Leg Wound Laterality: Right, Lateral Cleanser: Soap and Water 3 x Per Week/30 Days Discharge Instructions: May shower and wash wound with dial antibacterial soap and water prior to dressing change. Peri-Wound Care: Zinc Oxide Ointment 30g tube 3 x Per Week/30 Days Discharge Instructions: Apply Zinc Oxide to periwound with each dressing change Peri-Wound Care: Sween Lotion (Moisturizing lotion) 3 x Per Week/30 Days Discharge Instructions: Apply moisturizing lotion as directed Prim Dressing: Hydrofera Blue Ready Transfer Foam, 4x5 (in/in) (Generic) 3 x Per Week/30 Days ary Discharge Instructions: Apply to wound bed as instructed Secondary Dressing: ABD Pad, 8x10 3 x Per Week/30 Days Discharge Instructions: Apply over primary dressing as directed. Secondary Dressing: Woven Gauze Sponge, Non-Sterile 4x4 in 3 x Per Week/30 Days Discharge Instructions: Apply over primary dressing as directed. Secondary Dressing: Zetuvit Plus 4x8 in 3 x Per Week/30 Days Discharge Instructions: Or double ABD pad Apply over  primary dressing as directed. Com pression Wrap: Kerlix Roll 4.5x3.1 (in/yd) 3 x Per Week/30 Days Discharge Instructions: Apply Kerlix and Coban compression as directed. Com pression Wrap: Coban Self-Adherent Wrap 4x5 (in/yd) 3 x Per Week/30 Days Discharge Instructions: Apply over Kerlix as directed. 1. I would recommend that we have the patient continue to monitor for any signs of infection or worsening. Based on what I am seeing I do believe that she is making progress although it is slow progress. 2. I am also can recommend that we have the patient continue to utilize compression wrap and I think a light Kerlix and Coban wrap would be a good way to go at this point. We will see patient back for reevaluation in 1 week here in the clinic. If anything worsens or changes patient will contact our office for additional recommendations. Jamie [name], Jamie [name]  ([id number]) [id number] Page 8 of 8 Electronic Signature(s) Signed: 08/14/2022 1:45:55 PM By: Jamie Derry PA-C Entered By: Jamie [name] on 08/14/2022 13:45:55 -------------------------------------------------------------------------------- SuperBill Details Patient Name: Date of Service: Jamie [name], [name]. 08/14/2022 Medical Record Number: [id number] Patient Account Number: [id number] Date of Birth/Sex: Treating RN: Nov 05, 1940 (82 y.o. Debara Pickett, Yvonne Kendall Primary Care Provider: Delorise [name] Other Clinician: Referring Provider: Treating Provider/Extender: Jamie [name], Jamie [name] in Treatment: 12 Diagnosis Coding ICD-10 Codes Code Description 9193316059 Chronic venous hypertension (idiopathic) with ulcer and inflammation of right lower extremity 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d L97.822 Non-pressure chronic ulcer of other part of left lower leg with fat layer exposed I25.10 Atherosclerotic heart disease of native coronary artery without angina pectoris Facility Procedures : CPT4 Code: [id number] Description: 84696 - WOUND CARE VISIT-LEV 5 EST PT Modifier: Quantity: 1 Physician Procedures : CPT4 Code Description Modifier [id number] - WC PHYS LEVEL 3 - EST PT ICD-10 Diagnosis Description I87.331 Chronic venous hypertension (idiopathic) with ulcer and inflammation of right lower extremity L97.812 Non-pressure chronic ulcer of other part  of right lower leg with fat layer exposed L97.822 Non-pressure chronic ulcer of other part of left lower leg with fat layer exposed I25.10 Atherosclerotic heart disease of native coronary artery without angina pectoris Quantity: 1 Electronic Signature(s) Signed: 08/14/2022 1:46:19 PM By: Jamie Derry PA-C Previous Signature: 08/14/2022 11:31:33 AM Version By: Shawn Stall RN, BSN Entered By: Jamie [name] on 08/14/2022 13:46:19

## 2022-08-14 NOTE — Progress Notes (Signed)
DAMESHIA, HORIE ([id number]) [id number] Page 1 of 7 Visit Report for 08/14/2022 Arrival Information Details Patient Name: Date of Service: Jamie [name], Jamie [name]. 08/14/2022 11:00 A M Medical Record Number: [id number] Patient Account Number: [id number] Date of Birth/Sex: Treating RN: [birth year] (82 y.o. [name] Primary Care Kamron Vanwyhe: [name] Other Clinician: Referring Jerrika Ledlow: Treating Alonie Gazzola/Extender: [name], [name] in Treatment: 12 Visit Information History Since Last Visit Added or deleted any medications: No Patient Arrived: Wheel Chair Any new allergies or adverse reactions: No Arrival Time: 10:18 Had a fall or experienced change in No Accompanied By: niece activities of daily living that may affect Transfer Assistance: Manual risk of falls: Patient Identification Verified: Yes Signs or symptoms of abuse/neglect since last visito No Secondary Verification Process Completed: Yes Hospitalized since last visit: No Patient Requires Transmission-Based Precautions: No Implantable device outside of the clinic excluding No Patient Has Alerts: No cellular tissue based products placed in the center since last visit: Has Dressing in Place as Prescribed: Yes Has Compression in Place as Prescribed: Yes Pain Present Now: Yes Electronic Signature(s) Signed: 08/14/2022 11:31:33 AM By: Shawn Stall RN, BSN Entered By: Shawn Stall on 08/14/2022 10:19:09 -------------------------------------------------------------------------------- Clinic Level of Care Assessment Details Patient Name: Date of Service: Jamie Mart W. 08/14/2022 11:00 A M Medical Record Number: [id number] Patient Account Number: [id number] Date of Birth/Sex: Treating RN: Aug 06, 1940 (82 y.o. [name] Primary Care Alichia Alridge: [name] Other Clinician: Referring Labib Cwynar: Treating Faelyn Sigler/Extender: [name], [name] in Treatment: 12 Clinic Level of Care Assessment Items TOOL 4 Quantity Score X- 1 0 Use when only an EandM is performed on FOLLOW-UP visit ASSESSMENTS - Nursing Assessment / Reassessment X- 1 10 Reassessment of Co-morbidities (includes updates in patient status) X- 1 5 Reassessment of Adherence to Treatment Plan ASSESSMENTS - Wound and Skin A ssessment / Reassessment []  - 0 Simple Wound Assessment / Reassessment - one wound X- 3 5 Complex Wound Assessment / Reassessment - multiple wounds X- 1 10 Dermatologic / Skin Assessment (not related to wound area) ASSESSMENTS - Focused Assessment X- 1 5 Circumferential Edema Measurements - multi extremities X- 1 10 Nutritional Assessment / Counseling / Intervention []  - 0 Lower Extremity Assessment (monofilament, tuning fork, pulses) []  - 0 Peripheral Arterial Disease Assessment (using hand held doppler) ASSESSMENTS - Ostomy and/or Continence Assessment and Care []  - 0 Incontinence Assessment and Management []  - 0 Ostomy Care Assessment and Management (repouching, etc.) PROCESS - Coordination of Care Jamie [name], Jamie [name] ([id number]) [id number] Page 2 of 7 []  - 0 Simple Patient / Family Education for ongoing care X- 1 20 Complex (extensive) Patient / Family Education for ongoing care X- 1 10 Staff obtains Chiropractor, Records, T Results / Process Orders est X- 1 10 Staff telephones HHA, Nursing Homes / Clarify orders / etc []  - 0 Routine Transfer to another Facility (non-emergent condition) []  - 0 Routine Hospital Admission (non-emergent condition) []  - 0 New Admissions / Manufacturing engineer / Ordering NPWT Apligraf, etc. , []  - 0 Emergency Hospital Admission (emergent condition) []  - 0 Simple Discharge Coordination X- 1 15 Complex (extensive) Discharge Coordination PROCESS - Special Needs []  - 0 Pediatric / Minor Patient Management []  -  0 Isolation Patient Management []  - 0 Hearing / Language / Visual special needs []  - 0 Assessment of Community assistance (transportation, D/C planning, etc.) []  - 0 Additional assistance / Altered mentation []  - 0 Support Surface(s) Assessment (bed, cushion, seat,  etc.) INTERVENTIONS - Wound Cleansing / Measurement []  - 0 Simple Wound Cleansing - one wound X- 3 5 Complex Wound Cleansing - multiple wounds X- 1 5 Wound Imaging (photographs - any number of wounds) []  - 0 Wound Tracing (instead of photographs) []  - 0 Simple Wound Measurement - one wound X- 3 5 Complex Wound Measurement - multiple wounds INTERVENTIONS - Wound Dressings []  - 0 Small Wound Dressing one or multiple wounds []  - 0 Medium Wound Dressing one or multiple wounds X- 1 20 Large Wound Dressing one or multiple wounds []  - 0 Application of Medications - topical []  - 0 Application of Medications - injection INTERVENTIONS - Miscellaneous []  - 0 External ear exam []  - 0 Specimen Collection (cultures, biopsies, blood, body fluids, etc.) []  - 0 Specimen(s) / Culture(s) sent or taken to Lab for analysis []  - 0 Patient Transfer (multiple staff / Nurse, adult / Similar devices) []  - 0 Simple Staple / Suture removal (25 or less) []  - 0 Complex Staple / Suture removal (26 or more) []  - 0 Hypo / Hyperglycemic Management (close monitor of Blood Glucose) []  - 0 Ankle / Brachial Index (ABI) - do not check if billed separately X- 1 5 Vital Signs Has the patient been seen at the hospital within the last three years: Yes Total Score: 170 Level Of Care: New/Established - Level 5 Electronic Signature(s) Signed: 08/14/2022 11:31:33 AM By: Shawn Stall RN, BSN Jamie [name] ([id number]) [id number] Page 3 of 7 Entered By: Shawn Stall on 08/14/2022 10:36:47 -------------------------------------------------------------------------------- Encounter Discharge Information  Details Patient Name: Date of Service: Jamie [name], Jamie [name]. 08/14/2022 11:00 A M Medical Record Number: [id number] Patient Account Number: [id number] Date of Birth/Sex: Treating RN: 1940/12/08 (82 y.o. [name] Primary Care Hughie Melroy: [name] Other Clinician: Referring Ledarrius Beauchaine: Treating Yuleidy Rappleye/Extender: [name], Billey Gosling in Treatment: 12 Encounter Discharge Information Items Discharge Condition: Stable Ambulatory Status: Wheelchair Discharge Destination: Home Transportation: Private Auto Accompanied By: niece Schedule Follow-up Appointment: Yes Clinical Summary of Care: Electronic Signature(s) Signed: 08/14/2022 11:31:33 AM By: Shawn Stall RN, BSN Entered By: Shawn Stall on 08/14/2022 10:37:38 -------------------------------------------------------------------------------- Lower Extremity Assessment Details Patient Name: Date of Service: Jamie Mart W. 08/14/2022 11:00 A M Medical Record Number: [id number] Patient Account Number: [id number] Date of Birth/Sex: Treating RN: 04-06-1941 (82 y.o. [name] Primary Care Ayaat Jansma: [name] Other Clinician: Referring Ercelle Winkles: Treating Yari Szeliga/Extender: [name], [name] in Treatment: 12 Edema Assessment Assessed: [Left: No] [Right: Yes] Edema: [Left: Ye] [Right: s] Calf Left: Right: Point of Measurement: From Medial Instep 35.5 cm Ankle Left: Right: Point of Measurement: From Medial Instep 26 cm Vascular Assessment Pulses: Dorsalis Pedis Palpable: [Right:Yes] Electronic Signature(s) Signed: 08/14/2022 11:31:33 AM By: Shawn Stall RN, BSN Entered By: Shawn Stall on 08/14/2022 10:21:27 -------------------------------------------------------------------------------- Multi-Disciplinary Care Plan Details Patient Name: Date of Service: Jamie [name], [name] 08/14/2022 11:00 A M Medical Record Number:  [id number] Patient Account Number: [id number] Date of Birth/Sex: Treating RN: [birth year] (82 y.o. 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 Compression as ordered Provide education on venous insufficiency Notes: Wound/Skin Impairment Nursing Diagnoses: Impaired tissue integrity Knowledge deficit related to ulceration/compromised skin integrity Goals: Patient/caregiver will verbalize understanding of skin care regimen Date Initiated: 05/22/2022 Target Resolution Date: 10/04/2022 Goal Status: Active Ulcer/skin breakdown will have a volume reduction of 30% by week 4 Date Initiated: 05/22/2022 Date Inactivated: 07/10/2022 Target Resolution Date: 07/06/2022 Unmet Reason: see wound Goal Status: Unmet measurement. Interventions: Assess patient/caregiver ability to obtain necessary supplies Assess patient/caregiver ability to perform ulcer/skin care regimen upon admission and as needed Assess ulceration(s) every visit Provide education on ulcer and skin care Notes: Electronic Signature(s) Signed: 08/14/2022 11:31:33 AM By: Shawn Stall RN, BSN Entered By: Shawn Stall on 08/14/2022  10:25:05 -------------------------------------------------------------------------------- Pain Assessment Details Patient Name: Date of Service: Jamie Mart W. 08/14/2022 11:00 A M Medical Record Number: [id number] Patient Account Number: [id number] Date of Birth/Sex: Treating RN: [birth year] (82 y.o. [name] Primary Care Fia Hebert: [name] Other Clinician: Referring Breeley Bischof: Treating Mariene Dickerman/Extender: [name], [name] in Treatment: 12 Active Problems Location of Pain Severity and Description of Pain Patient Has Paino Yes Site Locations Pain Location: Jamie [name], Jamie [name] ([id number]) [id number] Page 5 of 7 Pain Location: Generalized Pain, Pain in Ulcers Rate the pain. Current Pain Level: 9 Pain Management and Medication Current Pain Management: Medication: No Cold Application: No Rest: No Massage: No Activity: No T.E.N.S.: No Heat Application: No Leg drop or elevation: No Is the Current Pain Management Adequate: Adequate How does your wound impact your activities of daily livingo Sleep: No Bathing: No Appetite: No Relationship With Others: No Bladder Continence: No Emotions: No Bowel Continence: No Work: No Toileting: No Drive: No Dressing: No Hobbies: No Electronic Signature(s) Signed: 08/14/2022 11:31:33 AM By: Shawn Stall RN, BSN Entered By: Shawn Stall on 08/14/2022 10:19:22 -------------------------------------------------------------------------------- Patient/Caregiver Education Details Patient Name: Date of Service: Jamie [name] 5/8/2024andnbsp11:00 A M Medical Record Number: [id number] Patient Account Number: [id number] Date of Birth/Gender: Treating RN: [birth year] (82 y.o. [name] Primary Care Physician: [name] Other Clinician: Referring Physician: Treating Physician/Extender: [name],  Billey Gosling in Treatment: 12 Education Assessment Education Provided To: Patient Education Topics Provided Wound/Skin Impairment: Handouts: Caring for Your Ulcer Methods: Explain/Verbal Responses: Reinforcements needed Electronic Signature(s) Signed: 08/14/2022 11:31:33 AM By: Shawn Stall RN, BSN Entered By: Shawn Stall on 08/14/2022 10:25:18 -------------------------------------------------------------------------------- Wound Assessment Details Patient Name: Date of Service: Jamie [name], [name] 08/14/2022 11:00 A Jamie [name] ([id number]) [id number] Page 6 of 7 Medical Record Number: [id number] Patient Account Number: [id number] Date of Birth/Sex: Treating RN: 10-03-1940 (82 y.o. Debara Pickett, Yvonne Kendall Primary Care Damante Spragg: [name] Other Clinician: Referring Samaa Ueda: Treating Velicia Dejager/Extender: [name], [name] in Treatment: 12 Wound Status Wound Number: 5 Primary Venous Leg Ulcer Etiology: Wound Location: Right, Lateral Lower Leg Wound Open Wounding Event: Trauma Status: Date Acquired: 04/17/2022 Comorbid Sleep Apnea, Hypertension, Peripheral Venous Disease, Weeks Of Treatment: 12 History: Osteoarthritis, Neuropathy Clustered Wound: No Photos Wound Measurements Length: (cm) 13 Width: (cm) 7.2 Depth: (cm) 0.2 Area: (cm) 73.513 Volume: (cm) 14.703 % Reduction in Area: -180.9% % Reduction in Volume: -87.3% Epithelialization: None Tunneling: No Undermining: No Wound Description Classification: Full Thickness Without Exposed Suppor Wound Margin: Distinct, outline attached Exudate Amount: Large Exudate Type: Serosanguineous Exudate Color: red, brown t Structures Foul Odor After Cleansing: No  Slough/Fibrino Yes Wound Bed Granulation Amount: Large (67-100%) Exposed Structure Granulation Quality: Red, Pink, Hyper-granulation Fascia Exposed: No Necrotic Amount: Small  (1-33%) Fat Layer (Subcutaneous Tissue) Exposed: Yes Necrotic Quality: Adherent Slough Tendon Exposed: No Muscle Exposed: No Joint Exposed: No Bone Exposed: No Periwound Skin Texture Texture Color No Abnormalities Noted: No No Abnormalities Noted: No Callus: No Atrophie Blanche: No Crepitus: No Cyanosis: No Excoriation: No Ecchymosis: No Induration: No Erythema: No Rash: No Hemosiderin Staining: Yes Scarring: Yes Mottled: No Pallor: No Moisture Rubor: No No Abnormalities Noted: No Dry / Scaly: No Temperature / Pain Maceration: Yes Tenderness on Palpation: Yes Treatment Notes Wound #5 (Lower Leg) Wound Laterality: Right, Lateral Cleanser Soap and Water Discharge Instruction: May shower and wash wound with dial antibacterial soap and water prior to dressing change. Jamie [name], Jamie [name] ([id number]) [id number] Page 7 of 7 Peri-Wound Care Zinc Oxide Ointment 30g tube Discharge Instruction: Apply Zinc Oxide to periwound with each dressing change Sween Lotion (Moisturizing lotion) Discharge Instruction: Apply moisturizing lotion as directed Topical Primary Dressing Hydrofera Blue Ready Transfer Foam, 4x5 (in/in) Discharge Instruction: Apply to wound bed as instructed Secondary Dressing ABD Pad, 8x10 Discharge Instruction: Apply over primary dressing as directed. Woven Gauze Sponge, Non-Sterile 4x4 in Discharge Instruction: Apply over primary dressing as directed. Zetuvit Plus 4x8 in Discharge Instruction: Or double ABD pad Apply over primary dressing as directed. Secured With Compression Wrap Kerlix Roll 4.5x3.1 (in/yd) Discharge Instruction: Apply Kerlix and Coban compression as directed. Coban Self-Adherent Wrap 4x5 (in/yd) Discharge Instruction: Apply over Kerlix as directed. Compression Stockings Add-Ons Electronic Signature(s) Signed: 08/14/2022 11:31:33 AM By: Shawn Stall RN, BSN Entered By: Shawn Stall on 08/14/2022  10:27:15 -------------------------------------------------------------------------------- Vitals Details Patient Name: Date of Service: Jamie Brink, MA [name] 08/14/2022 11:00 A M Medical Record Number: [id number] Patient Account Number: [id number] Date of Birth/Sex: Treating RN: July 26, 1940 (82 y.o. [name] Primary Care [place] Lapaglia: [name] Other Clinician: Referring Ranson Belluomini: Treating Micahel Omlor/Extender: [name], [name] in Treatment: 12 Vital Signs Time Taken: 10:20 Temperature (F): 98.3 Height (in): 62 Pulse (bpm): 70 Weight (lbs): 171 Respiratory Rate (breaths/min): 20 Body Mass Index (BMI): 31.3 Blood Pressure (mmHg): 144/85 Reference Range: 80 - 120 mg / dl Electronic Signature(s) Signed: 08/14/2022 11:31:33 AM By: Shawn Stall RN, BSN Entered By: Shawn Stall on 08/14/2022 10:20:53

## 2022-08-21 ENCOUNTER — Encounter (HOSPITAL_BASED_OUTPATIENT_CLINIC_OR_DEPARTMENT_OTHER): Payer: Medicare HMO | Admitting: Physician Assistant

## 2022-08-21 DIAGNOSIS — I87331 Chronic venous hypertension (idiopathic) with ulcer and inflammation of right lower extremity: Secondary | ICD-10-CM | POA: Diagnosis not present

## 2022-08-28 ENCOUNTER — Encounter (HOSPITAL_BASED_OUTPATIENT_CLINIC_OR_DEPARTMENT_OTHER): Payer: Medicare HMO | Admitting: Physician Assistant

## 2022-08-28 DIAGNOSIS — I87331 Chronic venous hypertension (idiopathic) with ulcer and inflammation of right lower extremity: Secondary | ICD-10-CM | POA: Diagnosis not present

## 2022-08-28 NOTE — Progress Notes (Signed)
Jamie [name] ([id number]) [id number] Page 1 of 3 Visit Report for 08/28/2022 Chief Complaint Document Details Patient Name: Date of Service: Jamie [name], [place] 08/28/2022 10:15 A M Medical Record Number: [id number] Patient Account Number: [id number] Date of Birth/Sex: Treating RN: 1940-06-13 (82 y.o. F) Primary Care Provider: Delorise [name] Other Clinician: Referring Provider: Treating Provider/Extender: Jamie [name], Jamie [name] Weeks in Treatment: 14 Information Obtained from: Patient Chief Complaint Bilateral LE Ulcers Electronic Signature(s) Signed: 08/28/2022 10:18:48 AM By: Allen Derry PA-C Entered By: Allen Derry on 08/28/2022 10:18:48 -------------------------------------------------------------------------------- Physician Orders Details Patient Name: Date of Service: Jamie [name]. 08/28/2022 10:15 A M Medical Record Number: [id number] Patient Account Number: [id number] Date of Birth/Sex: Treating RN: 06-Feb-1941 (82 y.o. 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 Jamie [name] Wednesday 09/04/2022 1015 room 8 Return A ppointment in 2 weeks. Jamie [name] Wednesday 09/11/2022 1015 room 8 Return A Other: - home health to wash right leg and foot with soap (dial soap) and water with  dressing changes. Patient to have soap and towels next to her when you arrive. *****[facility] compounding topical antibiotics. Once arrives to home, home health FOLLOW THE PHARMACY INSTRUCTIONS ON HOW TO MIX, and apply under the hydrofera blue.****** ****PLEASE APPLY ZINC OXIDE AROUND THE PERIWOUND DUE TO MACERATION. ****Patient to bring in the [facility] topical antibiotics to each wound care appt as well.***** *****HOME HEALTH TO PLEASE WRAP FROM BASE OF TOES TO JUST BELOW KNEE. please appy first layer of unna boot at the bast of the toes and just below the knee to help hold the compression wrap in place.**** ****IF YOU STOP THE WRAP BELOW CALF THEN SWELLING WILL HAPPEN OVER THE CALF.**** Anesthetic Wound #5 Right,Lateral Lower Leg (In clinic) Topical Lidocaine 5% applied to wound bed (In clinic) Topical Lidocaine 4% applied to wound bed Bathing/ Shower/ Hygiene May shower and wash wound with soap and water. - with dressing changes. Edema Control - Lymphedema / SCD / Other Right Lower Extremity Elevate legs to the level of the heart or above for 30 minutes daily and/or when sitting for 3-4 times a day throughout the day. Avoid standing for long periods of time. Exercise regularly [name], [name] ([id number]) [id number] Page 2 of 3 Home Health Wound #5 Right,Lateral Lower Leg New wound care orders this week; continue Home Health for wound care. May utilize formulary equivalent dressing for wound treatment orders unless otherwise specified. - home health to wash right leg and foot with soap (dial soap) and water with dressing changes. Patient to have soap and towels next to her when you arrive. *****[facility] compounding topical antibiotics. Once arrives to home, home health FOLLOW THE PHARMACY INSTRUCTIONS ON HOW TO MIX, and apply under the hydrofera blue.****** ****PLEASE APPLY ZINC OXIDE AROUND THE PERIWOUND DUE TO MACERATION. ****Patient  to bring in the [facility] topical antibiotics to each wound care appt as well.***** *****HOME HEALTH TO PLEASE WRAP FROM BASE OF TOES TO JUST BELOW KNEE. please appy  first layer of unna boot at the bast of the toes and just below the knee to help hold the compression wrap in place.**** ****IF YOU STOP THE WRAP BELOW CALF THEN SWELLING WILL HAPPEN OVER THE CALF.**** Other Home Health Orders/Instructions: - [facility] in [place], [place] fax # [phone]. Wound Treatment Wound #5 - Lower Leg Wound Laterality: Right, Lateral Cleanser: Soap and Water 3 x Per Week/30 Days Discharge Instructions: May shower and wash wound with dial antibacterial soap and water prior to dressing change. Peri-Wound Care: Zinc Oxide Ointment 30g tube 3 x Per Week/30 Days Discharge Instructions: Apply Zinc Oxide to periwound with each dressing change TO ANY MACERATED SKIN. Peri-Wound Care: Sween Lotion (Moisturizing lotion) 3 x Per Week/30 Days Discharge Instructions: Apply moisturizing lotion as directed Topical: compounding topical antibiotics 3 x Per Week/30 Days Discharge Instructions: Once arrives apply directly to wound bed under the hydrofera blue. Home Health start. MIX ACCORDING TO THE PHARMACY INSTRUCTIONS. Prim Dressing: Hydrofera Blue Ready Transfer Foam, 4x5 (in/in) (Generic) 3 x Per Week/30 Days ary Discharge Instructions: Apply over the topical antibiotics wound bed as instructed Secondary Dressing: ABD Pad, 8x10 3 x Per Week/30 Days Discharge Instructions: Apply over primary dressing as directed. Secondary Dressing: Woven Gauze Sponge, Non-Sterile 4x4 in 3 x Per Week/30 Days Discharge Instructions: Apply over primary dressing as directed. Secondary Dressing: Zetuvit Plus 4x8 in 3 x Per Week/30 Days Discharge Instructions: Or double ABD pad Apply over primary dressing as directed. Compression Wrap: Kerlix Roll 4.5x3.1 (in/yd) 3 x Per Week/30 Days Discharge Instructions: Apply Kerlix and  Coban compression as directed. Compression Wrap: Coban Self-Adherent Wrap 4x5 (in/yd) 3 x Per Week/30 Days Discharge Instructions: Apply over Kerlix as directed. Compression Wrap: unna boot FIRST LAYER **** SEE INSTRUCTIONS 3 x Per Week/30 Days Discharge Instructions: APPLY FIRST LAYER UNNA BOOT AT BASES OF TOES AND JUST BELOW THE KNEE TO HOLD COMPRESSION WRAP IN PLACE. Electronic Signature(s) Unsigned Entered By: Shawn Stall on 08/28/2022 10:31:32 -------------------------------------------------------------------------------- Problem List Details Patient Name: Date of Service: Jamie [name], [place] 08/28/2022 10:15 A M Medical Record Number: [id number] Patient Account Number: [id number] Date of Birth/Sex: Treating RN: 1941-02-01 (82 y.o. F) Primary Care Provider: Delorise [name] Other Clinician: Referring Provider: Treating Provider/Extender: Jamie [name], Billey Gosling in Treatment: [address] Problems ICD-10 Jamie [name], Jamie [name] ([id number]) [id number] Page 3 of 3 Encounter Code Description Active Date MDM Diagnosis I87.331 Chronic venous hypertension (idiopathic) with ulcer and inflammation of right 05/22/2022 No Yes lower extremity L97.812 Non-pressure chronic ulcer of other part of right lower leg with fat layer 05/22/2022 No Yes exposed L97.822 Non-pressure chronic ulcer of other part of left lower leg with fat layer exposed2/21/2024 No Yes I25.10 Atherosclerotic heart disease of native coronary artery without angina pectoris 05/22/2022 No Yes Inactive Problems Resolved Problems Electronic Signature(s) Signed: 08/28/2022 10:18:34 AM By: Allen Derry PA-C Entered By: Allen Derry on 08/28/2022 10:18:34 -------------------------------------------------------------------------------- SuperBill Details Patient Name: Date of Service: Jamie [name], Christean Grief. 08/28/2022 Medical Record Number: [id number] Patient Account  Number: [id number] Date of Birth/Sex: Treating RN: 11-Jul-1940 (82 y.o. 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

## 2022-08-29 NOTE — Progress Notes (Signed)
GRECIA, NAUGHER ([id number]) [id number] Page 1 of 1 Visit Report for 08/28/2022 Fall Risk Assessment Details Patient Name: Date of Service: [name], [name]. 08/28/2022 10:15 A M Medical Record Number: [id number] Patient Account Number: [id number] Date of Birth/Sex: Treating RN: 1941/02/08 (82 y.o. [name] Primary Care Domingos Riggi: [name] Other Clinician: Referring Hoyte Ziebell: Treating Amberly Livas/Extender: [name], [name] in Treatment: 14 Fall Risk Assessment Items Have you had 2 or more falls in the last 12 monthso 0 No Have you had any fall that resulted in injury in the last 12 monthso 0 No FALLS RISK SCREEN History of falling - immediate or within 3 months 25 Yes Secondary diagnosis (Do you have 2 or more medical diagnoseso) 0 No Ambulatory aid None/bed rest/wheelchair/nurse 0 Yes Crutches/cane/walker 15 Yes Furniture 0 No Intravenous therapy Access/Saline/Heparin Lock 0 No Gait/Transferring Normal/ bed rest/ wheelchair 0 No Weak (short steps with or without shuffle, stooped but able to lift head while walking, may seek 10 Yes support from furniture) Impaired (short steps with shuffle, may have difficulty arising from chair, head down, impaired 0 No balance) Mental Status Oriented to own ability 0 Yes Electronic Signature(s) Signed: 08/28/2022 6:02:30 PM By: Shawn Stall RN, BSN Entered By: Shawn Stall on 08/28/2022 10:24:26

## 2022-09-04 ENCOUNTER — Encounter (HOSPITAL_BASED_OUTPATIENT_CLINIC_OR_DEPARTMENT_OTHER): Payer: Medicare HMO | Admitting: Physician Assistant

## 2022-09-04 DIAGNOSIS — I87331 Chronic venous hypertension (idiopathic) with ulcer and inflammation of right lower extremity: Secondary | ICD-10-CM | POA: Diagnosis not present

## 2022-09-11 ENCOUNTER — Encounter (HOSPITAL_BASED_OUTPATIENT_CLINIC_OR_DEPARTMENT_OTHER): Payer: Medicare HMO | Attending: Physician Assistant | Admitting: Physician Assistant

## 2022-09-11 DIAGNOSIS — I87331 Chronic venous hypertension (idiopathic) with ulcer and inflammation of right lower extremity: Secondary | ICD-10-CM | POA: Insufficient documentation

## 2022-09-11 DIAGNOSIS — L97812 Non-pressure chronic ulcer of other part of right lower leg with fat layer exposed: Secondary | ICD-10-CM | POA: Insufficient documentation

## 2022-09-11 DIAGNOSIS — Z8614 Personal history of Methicillin resistant Staphylococcus aureus infection: Secondary | ICD-10-CM | POA: Diagnosis not present

## 2022-09-11 DIAGNOSIS — I2510 Atherosclerotic heart disease of native coronary artery without angina pectoris: Secondary | ICD-10-CM | POA: Insufficient documentation

## 2022-09-11 DIAGNOSIS — I872 Venous insufficiency (chronic) (peripheral): Secondary | ICD-10-CM | POA: Insufficient documentation

## 2022-09-11 DIAGNOSIS — G629 Polyneuropathy, unspecified: Secondary | ICD-10-CM | POA: Insufficient documentation

## 2022-09-11 DIAGNOSIS — L97822 Non-pressure chronic ulcer of other part of left lower leg with fat layer exposed: Secondary | ICD-10-CM | POA: Insufficient documentation

## 2022-09-11 DIAGNOSIS — M1990 Unspecified osteoarthritis, unspecified site: Secondary | ICD-10-CM | POA: Diagnosis not present

## 2022-09-18 ENCOUNTER — Encounter (HOSPITAL_BASED_OUTPATIENT_CLINIC_OR_DEPARTMENT_OTHER): Payer: Medicare HMO | Admitting: Physician Assistant

## 2022-09-18 DIAGNOSIS — I87331 Chronic venous hypertension (idiopathic) with ulcer and inflammation of right lower extremity: Secondary | ICD-10-CM | POA: Diagnosis not present

## 2022-09-18 NOTE — Progress Notes (Addendum)
Jamie, [name] ([id number]) [id number] Page 1 of 10 Visit Report for 09/18/2022 Chief Complaint Document Details Patient Name: Date of Service: Jamie [name], [name]. 09/18/2022 10:15 A M Medical Record Number: [id number] Patient Account Number: [id number] Date of Birth/Sex: Treating RN: [birth year] (82 y.o. F) Primary Care Provider: Delorise [name] Other Clinician: Referring Provider: Treating Provider/Extender: Jamie [name], Jamie [name] in Treatment: 17 Information Obtained from: Patient Chief Complaint Bilateral LE Ulcers Electronic Signature(s) Signed: 09/18/2022 10:32:00 AM By: Jamie Derry PA-C Entered By: Jamie [name] on 09/18/2022 10:32:00 -------------------------------------------------------------------------------- Debridement Details Patient Name: Date of Service: Jamie [name], Harrell Gave [name]. 09/18/2022 10:15 A M Medical Record Number: [id number] Patient Account Number: [id number] Date of Birth/Sex: Treating RN: 1940-08-29 (82 y.o. Jamie [name], Jamie [name] Primary Care Provider: Delorise [name] Other Clinician: Referring Provider: Treating Provider/Extender: Jamie [name], Jamie [name] in Treatment: 17 Debridement Performed for Assessment: Wound #5 Right,Lateral Lower Leg Performed By: Physician Jamie Kelp, PA Debridement Type: Debridement Severity of Tissue Pre Debridement: Fat layer exposed Level of Consciousness (Pre-procedure): Awake and Alert Pre-procedure Verification/Time Out Yes - 10:30 Taken: Start Time: 10:31 Pain Control: Lidocaine 5% topical ointment Percent of Wound Bed Debrided: 85% T Area Debrided (cm): otal 67.65 Tissue and other material debrided: Viable, Non-Viable, Fat, Slough, Subcutaneous, Skin: Epidermis, Slough Level: Skin/Subcutaneous Tissue Debridement Description: Excisional Instrument: Curette Bleeding: Minimum Hemostasis Achieved: Pressure End Time:  10:39 Procedural Pain: 0 Post Procedural Pain: 0 Response to Treatment: Procedure was tolerated well Level of Consciousness (Post- Awake and Alert procedure): Post Debridement Measurements of Total Wound Length: (cm) 13.7 Width: (cm) 7.4 Depth: (cm) 0.2 Volume: (cm) 15.925 Character of Wound/Ulcer Post Debridement: Improved Jamie [name], Jamie [name] ([id number]) [id number] Page 2 of 10 Severity of Tissue Post Debridement: Fat layer exposed Post Procedure Diagnosis Same as Pre-procedure Electronic Signature(s) Signed: 09/18/2022 4:47:25 PM By: Jamie Derry PA-C Signed: 09/19/2022 5:31:32 PM By: Jamie [name] Entered By: Jamie [name] on 09/18/2022 10:40:08 -------------------------------------------------------------------------------- HPI Details Patient Name: Date of Service: Jamie [name], Jamie [name]. 09/18/2022 10:15 A M Medical Record Number: [id number] Patient Account Number: [id number] Date of Birth/Sex: Treating RN: 1941-03-05 (82 y.o. F) Primary Care Provider: Delorise [name] Other Clinician: Referring Provider: Treating Provider/Extender: Jamie [name], Jamie [name] in Treatment: 17 History of Present Illness HPI Description: ADMISSION 06/30/2020 Jamie [name] is an 82 year old woman who lives in [place]. She is here with her niece for review of wounds on the left medial lower leg and ankle. These have apparently been present for over a year and she followed with Dr. Olegario Messier at the [facility] in [place] for quite a period of time although it looks as though there was a initial consult wound from Dr. Marcha Solders on February 18 presumably there was therefore hiatus. At that point the wounds were described as being there for 3 months. She also tells me she was at the wound care center in [place] for a period of time with this. 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  She had venous studies that were negative for DVT ABIs on the right were 1.01 on the left 1.06. She has . had previous applications of puraply, compression which she does not tolerate very well. She has had several rounds of oral antibiotic therapy. She complains of unrelenting pain and she is seeing Dr. Reece Agar V of pain management apparently was on oxycodone but that did not help. She also had a skin biopsy done  by Dr. Olegario Messier although we do not have that result. She does not appear to have an arterial issue. I am not completely clear what she has been putting on the wounds lately. 3/31; this patient has a particularly nasty set of wounds on the left medial ankle in the middle of what looks to be hemosiderin deposition secondary to chronic venous insufficiency. She has a lot of pain followed by pain management. Dr. Marcha Solders apparently did a biopsy of something on the left leg last fall what I would like to get this result. She has a history of MRSA treatment. I do not believe she had reflux studies but she did have DVT rule out studies. Previous ABIs have not suggested arterial insufficiency 4/7; difficult wounds on the left medial ankle probably chronic venous insufficiency. With considerable effort on behalf of our case manager we were able to finally to speak to somebody at the hospital in [place] who indicated that no biopsy of this area have been done even though the patient describes this in some detail and is even able to point out where she thinks the biopsy was done. We have been using Sorbact. The PCR culture I did showed polymicrobial identification with Pseudomonas, staph aureus, Peptostreptococcus. All of this and low titers. Resistance genes identified were MRSA, staph virulence gene and tetracycline. We are going to send this to [facility] for a topical antibiotic which is something that we have had good success with recently in large venous ulcers with a lot of purulent drainage. There would  not be an easy oral alternative here possibly line escalated and ciprofloxacin if we need to use systemic antibiotic 4/15; difficult area on the left medial ankle. Most likely chronic venous insufficiency. I think she will probably need venous reflux study I think she had DVT rule outs but not venous reflux studies. We have not yet obtained the topical antibiotics. She has home health changing the dressing we have been using Sorbact for adherent fibrinous debris on the surface. Very difficult to remove 4/22; patient presents for 1 week follow-up. She has been using sore back under compression wraps and these are changed 3 times a week with home health. She also had [facility]stone antibiotics sent to her house and brought them in today. She has no complaints or issues today. 5/2; patient is here for follow-up. She has been using Sorbact under compression. Very painful wound. She has been using Keystone antibiotics. Not much improvement although the more medial part of the wound has cleaned up nicely and the larger part of the wound about 50% slough covered. Part of the issue here is that she had stays at both [facility] wound care center, [facility] wound care center and now [place]. Not sure if she has had venous reflux studies. As far as we are able to tell she did not have a biopsy. My notes state that she did not have venous reflux studies just DVT rule outs. 5/16; patient goes for venous reflux studies this afternoon. She says that wound was biopsied which sounds like punch biopsies by Dr. Lynden Ang we do not have these results. We are using Sorbact. Very difficult wound to debride 5/23; patient presents for 1 week follow-up. She reports tolerating the wraps well with sorbact underneath. She had ABIs and venous reflux studies done. She has no issues or complaints today. She denies acute signs of infection. 6/6; I have reviewed the patient's vascular studies. Wounds are on the left medial and posterior calf. She had  significant reflux in the greater saphenous vein in the in the mid thigh, distal thigh knee and the small saphenous vein in the popliteal fossa. The vein diameters do not look too impressive though. She is going to see the vascular surgeon on Wednesday. She also had venous reflux in the right common femoral vein. She did not have any evidence of a DVT or SVT I am wondering whether there is an ablation procedure that would benefit her in the greater saphenous vein on the left. . She tells [place] that home health put the dressing on too tight and she took off 1 layer. The swelling in her left leg is a little worse as a result of this. Not much change in the wound measurements so the surface of the wound looks better Her arterial studies showed an ABI on the right of 1.14 with a triphasic waveform and a great toe pressure of 0.89. On the left her ABIs were noncompressible at 1.34 but with triphasic waveforms and a TBI of 0.97. Her greater toe pressure was 122. There was no evidence of significant bilateral arterial disease 6/20 patient went to see Dr. Durwin Nora. He did not think she had significant arterial disease. In terms of her venous duplex on the right side there was no evidence [employer], CONTI ([id number]) [id number] Page 3 of 10 of a DVT or SVT there was deep venous reflux involving the common femoral vein no superficial vein reflux on the left side there was no DVT or SVT there was no deep vein graft reflux there was some reflux in the greater saphenous vein from the mid thigh to the knee but the vein here was not dilated. He thought these were venous wounds he prescribed a compression pump but I am not sure who we ordered it from The patient has been approved for Apligraf. Still using silver collagen this week 7/5; Apligraf #1 7/19 Apligraf #2. Decent improvement in the condition of the wound bed. Epithelialization distal 8/2 Apligraf #3. No issues or complaints.  Denies signs of infection. 8/17 Apligraf #4. No issues or concerns. Some complaints of pruritus and the rash. Our intake nurse brought up the fact that she had previously indicated possible cotton layer sensitivity we will therefore use kerlix in the bottom layer of the compression 8/30; the patient comes in with the area on her left medial leg just about healed. There is a superficial area more towards the tibia and a smaller open area distally everything else is epithelialized. I do not think she requires another Apligraf 9/13; left medial leg is healed. 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 Her edema control is good Readmisstion: 05-22-2022 upon evaluation today patient presents for initial inspection here in our clinic concerning issues that she has been having with a wound of the right lateral lower extremity. This is an area of a previous skin graft she tells me. With that being said she unfortunately had a scrape on this that occurred around 10 January. Since that time she has noted that this has just continued to get bigger and bigger in her niece who is present with her today actually states that 2 [name] ago when she saw that it was significantly smaller than what she sees currently. Obviously this is of utmost concern as we do not want this to continue to get larger when arrested and get moving in the right direction. Fortunately there does not appear to be any  signs of systemic infection though locally I think we probably do have some infection present. I would obtain a culture to see what we have going on here and then we will subsequently see where things go going forward. Fortunately I think that she is in the right place at this point being at the wound center we can definitely do something to try to get this moving in the right direction. Patient does have a history of chronic venous insufficiency as well as coronary  artery disease. In the past she has done well with compression wraps on the start with a 3 layer wrap I think she is probably can end up going to a 4-layer at some point but we will see how things do over the next week. She has been using wound cleanser along with she tells me a zinc and topical but again I am not sure exactly what that was. 05-29-2022 upon evaluation today patient appears to be doing well currently in regard to her wound. This actually is showing signs of improvement I am happy in that regard unfortunately her left leg has an area on the shin that has opened. This is the region where one of the areas at least we have previously taken care of. Nonetheless this is small hoping we get it under control before things worsen significantly. 06-05-2022 upon evaluation today patient appears to be doing well currently in regard to her wounds. The actually seem to be fairly clean she is having still quite a bit of problem with pain at this point she would like to not have debridement today for all possible. With that being said I do believe that we are making some good progress I think the [facility] is doing a decent job here. 3/6; patient has had 3/6; patient with known severe chronic venous insufficiency. She apparently had a traumatic wound at home and has a reasonably substantial wound on the right lateral lower leg and a smaller one on the left anterior lower leg as well. We have been using Hydrofera Blue and Unna boots 3/13; patient presents for follow-up. We have been using Hydrofera Blue under Coflex to the legs bilaterally. She has no issues or complaints today. 06-26-2022 upon evaluation today patient appears to be doing well currently in regard to her wound. This is measuring a little bit larger however. Fortunately I do not see any signs of infection this is on the right side. Fortunately the left side is actually completely healed which is great news. 07-03-2022 patient's wound  unfortunately is continuing to show signs of being worse. Her compression which was the Tubigrip that I thought should be able to pull up actually slipped down and she was not able to pull that back up. With that being said that means that unfortunately her wound has continued to deteriorate since I last saw her. This is definitely not the direction that we are looking for. I discussed with her that I do believe we need to go ahead and see about getting her started on antibiotics and I subsequently would also like to go ahead and see about getting things moving forward with regard to a wound culture and making a change up her dressings as well but this can be changed more frequently than just once a week. 07-10-2022 upon evaluation today patient actually appears to be doing much better. I do believe that the antibiotics have been beneficial for her. Fortunately I do not see any signs of active infection locally  nor systemically which is great news. No fevers, chills, nausea, vomiting, or diarrhea. 07-17-2022 upon evaluation today patient appears to be doing well currently in regard to her wound which is actually showing signs of improvement this is slow but nonetheless prevalent that we are seeing good improvement here. Fortunately I do not see any signs of active infection locally nor systemically which is great news. 07-24-2022 upon evaluation today patient appears to be doing well currently in regard to her wound although the wrap that we had on has been slipping down and she really does not have anybody to help her with this at home health is not going to be coming out we could not get anybody that would actually be able to do the dressing changes. Fortunately I do not see any signs of active infection locally nor systemically which is great news. 08-07-22 poorly in regard to her leg compared to what it was previous. Fortunately there does not appear to be any signs of active infection locally  nor systemically which is great news. No fevers, chills, nausea, vomiting, or diarrhea. With that being said it does appear that the patient may have some cellulitis in the leg which is not good. 08-14-2022 upon evaluation today patient appears to be doing somewhat better in regard to her wounds she still is having quite a bit of pain we are still not where we want to be as far as healing is concerned completely. Fortunately I do not see any evidence of active infection locally nor systemically which is great news and I am very pleased in that regard. 08-21-23 upon evaluation today patient appears to be doing poorly currently in regard to her wound. She has been tolerating the dressing changes without complication. Fortunately there does not appear to be any signs of active infection locally nor systemically at this time. 08-28-2022 upon evaluation today patient appears to be doing poorly in regard to her wound this was not wrapped appropriately home health did not go up high enough on the wrap. This has caused some issues and I discussed that with the patient today. I do believe that she is going to require aggressive wrapping and treatment which she is getting the [facility] topical antibiotics today they can start using that at the next wrap on Friday. In the meantime they need to make sure that the rapid and appropriately we wrote very specific orders today. 09-04-2022 upon evaluation today patient appears to be doing well currently in regard to her wound which I think is making progress is still hurting her quite significantly. Fortunately I do not see any signs of active infection locally or systemically which is great news. No fevers, chills, nausea, vomiting, or diarrhea. 09-11-2022 upon evaluation today patient's wound actually is showing signs of being a little bit smaller and looking a little bit better she still has a lot going on here however. Fortunately I do not see any evidence of active  infection Worsening locally nor systemically which is great news. With that being said worsening still continue to use the topical Keystone antibiotics. 09-18-2022 upon evaluation today patient actually showing some signs of improvement. I am actually very pleased with where we stand compared to where we have been. I think that she is making good headway here. She is still having quite a bit of pain but it seems to be lessening compared to previous. Electronic Signature(s) Signed: 09/18/2022 2:56:00 PM By: Enis Slipper, Gelene W2:56:00 PM By: Jamie Derry PA-C Signed:  09/18/2022 ([id number]) [id number] Page 4 of 10 Entered By: Jamie [name] on 09/18/2022 14:56:00 -------------------------------------------------------------------------------- Physical Exam Details Patient Name: Date of Service: Jamie [name], [name]. 09/18/2022 10:15 A M Medical Record Number: [id number] Patient Account Number: [id number] Date of Birth/Sex: Treating RN: 07-30-1940 (82 y.o. F) Primary Care Provider: Delorise [name] Other Clinician: Referring Provider: Treating Provider/Extender: Jamie [name], Jamie [name] in Treatment: 17 Constitutional Chronically ill appearing but in no apparent acute distress. Respiratory normal breathing without difficulty. Psychiatric this patient is able to make decisions and demonstrates good insight into disease process. Alert and Oriented x 3. pleasant and cooperative. Notes Upon inspection patient's wound bed actually showed signs of good granulation epithelization at this point. Fortunately I do not see any signs of active infection locally or systemically which is great news and in general I do think that we are on the right track here. With that being said I did have to perform a fairly significant debridement today and the patient tolerated this debridement without complication. Postdebridement I feel like  this is actually much better. This debridement was necessary and to be honest though she did have pain she seemed to tolerate this quite well. Once we were complete with this the wound overall looks much improved. Electronic Signature(s) Signed: 09/18/2022 2:58:50 PM By: Jamie Derry PA-C Entered By: Jamie [name] on 09/18/2022 14:58:50 -------------------------------------------------------------------------------- Physician Orders Details Patient Name: Date of Service: Jamie [name], Harrell Gave [name]. 09/18/2022 10:15 A M Medical Record Number: [id number] Patient Account Number: [id number] Date of Birth/Sex: Treating RN: 09/07/1940 (82 y.o. [name] Primary Care Provider: Delorise [name] Other Clinician: Referring Provider: Treating Provider/Extender: Jamie [name], Billey Gosling in Treatment: 17 Verbal / Phone Orders: No Diagnosis Coding ICD-10 Coding Code Description I87.331 Chronic venous hypertension (idiopathic) with ulcer and inflammation of right lower extremity 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d L97.822 Non-pressure chronic ulcer of other part of left lower leg with fat layer exposed I25.10 Atherosclerotic heart disease of native coronary artery without angina pectoris Follow-up Appointments ppointment in 1 week. Leonard Schwartz Wednesday 09/25/2022 1015 room 8 Return A ppointment in 2 [name]. Leonard Schwartz Wednesday 10/02/2022 1015 room 8 Return A Return appointment in 3 [name]. Leonard Schwartz Wednesday 10/09/2022 1015 room 9 Return appointment in 1 month. Leonard Schwartz Wednesday 10/16/2022 1015 room 8 Other: - home health to wash right leg and foot with soap (dial soap) and water with dressing changes. Patient to have soap and towels next to her when you arrive. *****[facility] compounding topical antibiotics. FOLLOW THE PHARMACY INSTRUCTIONS ON HOW TO MIX, and apply under the FLORENDA, DELANY ([id number])  [phone].pdf Page 5 of 10 hydrofera blue.****** ****PLEASE APPLY ZINC OXIDE AROUND THE PERIWOUND DUE TO MACERATION. ****Patient to bring in the [facility] topical antibiotics to each wound care appt as well.***** *****HOME HEALTH TO PLEASE WRAP FROM BASE OF TOES TO JUST BELOW KNEE. please appy first layer of unna boot at the bast of the toes and just below the knee to help hold the compression wrap in place.**** Anesthetic Wound #5 Right,Lateral Lower Leg (In clinic) Topical Lidocaine 5% applied to wound bed (In clinic) Topical Lidocaine 4% applied to wound bed Bathing/ Shower/ Hygiene May shower and wash wound with soap and water. - with dressing changes. Edema Control - Lymphedema / SCD / Other Right Lower Extremity Elevate legs to the level of the heart or above for 30 minutes daily and/or when  sitting for 3-4 times a day throughout the day. Avoid standing for long periods of time. Exercise regularly Home Health Wound #5 Right,Lateral Lower Leg No change in wound care orders this week; continue Home Health for wound care. May utilize formulary equivalent dressing for wound treatment orders unless otherwise specified. Other Home Health Orders/Instructions: - [facility] in [place], [place] fax # [phone]. Wound Treatment Wound #5 - Lower Leg Wound Laterality: Right, Lateral Cleanser: Soap and Water 3 x Per Week/30 Days Discharge Instructions: May shower and wash wound with dial antibacterial soap and water prior to dressing change. Peri-Wound Care: Zinc Oxide Ointment 30g tube 3 x Per Week/30 Days Discharge Instructions: Apply Zinc Oxide to periwound with each dressing change TO ANY MACERATED SKIN. Peri-Wound Care: Sween Lotion (Moisturizing lotion) 3 x Per Week/30 Days Discharge Instructions: Apply moisturizing lotion as directed Topical: compounding topical antibiotics 3 x Per Week/30 Days Discharge Instructions: apply directly to wound bed  under the hydrofera blue. Home Health start. MIX ACCORDING TO THE PHARMACY INSTRUCTIONS. Prim Dressing: Hydrofera Blue Ready Transfer Foam, 4x5 (in/in) (Generic) 3 x Per Week/30 Days ary Discharge Instructions: Apply over the topical antibiotics wound bed as instructed Secondary Dressing: ABD Pad, 8x10 3 x Per Week/30 Days Discharge Instructions: Apply over primary dressing as directed. Secondary Dressing: Woven Gauze Sponge, Non-Sterile 4x4 in 3 x Per Week/30 Days Discharge Instructions: Apply over primary dressing as directed. Secondary Dressing: Zetuvit Plus 4x8 in 3 x Per Week/30 Days Discharge Instructions: Or double ABD pad Apply over primary dressing as directed. Compression Wrap: Kerlix Roll 4.5x3.1 (in/yd) 3 x Per Week/30 Days Discharge Instructions: Apply Kerlix and Coban compression as directed. Compression Wrap: Coban Self-Adherent Wrap 4x5 (in/yd) 3 x Per Week/30 Days Discharge Instructions: Apply over Kerlix as directed. Compression Wrap: unna boot FIRST LAYER **** SEE INSTRUCTIONS 3 x Per Week/30 Days Discharge Instructions: APPLY FIRST LAYER UNNA BOOT AT BASES OF TOES AND JUST BELOW THE KNEE TO HOLD COMPRESSION WRAP IN PLACE. Electronic Signature(s) Signed: 09/18/2022 4:47:25 PM By: Jamie Derry PA-C Signed: 09/19/2022 5:31:32 PM By: Jamie [name] Entered By: Jamie [name] on 09/18/2022 10:39:28 Jamie [name] ([id number]) [id number] Page 6 of 10 -------------------------------------------------------------------------------- Problem List Details Patient Name: Date of Service: Jamie [name], [name]. 09/18/2022 10:15 A M Medical Record Number: [id number] Patient Account Number: [id number] Date of Birth/Sex: Treating RN: 01/23/1941 (82 y.o. 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 05/22/2022 No Yes lower extremity L97.812 Non-pressure chronic ulcer of other part of right lower leg with fat layer 05/22/2022 No Yes exposed L97.822 Non-pressure chronic ulcer of other part of left lower leg with fat layer exposed2/21/2024 No Yes I25.10 Atherosclerotic heart disease of native coronary artery without angina pectoris 05/22/2022 No Yes Inactive Problems Resolved Problems Electronic Signature(s) Signed: 09/18/2022 10:31:54 AM By: Jamie Derry PA-C Entered By: Jamie [name] on 09/18/2022 10:31:54 -------------------------------------------------------------------------------- Progress Note Details Patient Name: Date of Service: Jamie [name], Harrell Gave [name]. 09/18/2022 10:15 A M Medical Record Number: [id number] Patient Account Number: [id number] Date of Birth/Sex: Treating RN: [birth year] (82 y.o. F) Primary Care Provider: Delorise [name] Other Clinician: Referring Provider: Treating Provider/Extender: Jamie [name], Jamie [name] in Treatment: 17 Subjective Chief Complaint Information obtained from Patient Bilateral LE Ulcers History of Present Illness (HPI)  ADMISSION 06/30/2020 SIMRANPREET, FALZON ([id number]) [id number] Page 7 of 10 Mrs. Abella is an 82 year old woman who lives in [place]. She is here with her niece for review of wounds on the left medial lower leg and ankle. These have apparently been present for over a year and she followed with Dr. Olegario Messier at the [facility] in [place] for quite a period of time although it looks as though there was a initial consult wound from Dr. Marcha Solders on February 18 presumably there was therefore hiatus. At that point the wounds were described as being there for 3 months. She also tells me she was at the wound  care center in [place] for a period of time with this. 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 She had venous studies that were negative for DVT ABIs on the right were 1.01 on the left 1.06. She has . had previous applications of puraply, compression which she does not tolerate very well. She has had several rounds of oral antibiotic therapy. She complains of unrelenting pain and she is seeing Dr. Reece Agar V of pain management apparently was on oxycodone but that did not help. She also had a skin biopsy done by Dr. Olegario Messier although we do not have that result. She does not appear to have an arterial issue. I am not completely clear what she has been putting on the wounds lately. 3/31; this patient has a particularly nasty set of wounds on the left medial ankle in the middle of what looks to be hemosiderin deposition secondary to chronic venous insufficiency. She has a lot of pain followed by pain management. Dr. Marcha Solders apparently did a biopsy of something on the left leg last fall what I would like to get this result. She has a history of MRSA treatment. I do not believe she had reflux studies but she did have DVT rule out studies. Previous ABIs have not suggested arterial insufficiency 4/7; difficult wounds on the left medial ankle probably chronic venous insufficiency. With considerable effort on behalf of our case manager we were able to finally to speak to somebody at the hospital in [place] who indicated that no biopsy of this area have been done even though the patient describes this in some detail and is even able to point out where she thinks the biopsy was done. We have been using Sorbact. The PCR culture I did showed polymicrobial identification with Pseudomonas, staph aureus, Peptostreptococcus. All of this and low titers. Resistance genes identified were MRSA, staph virulence gene and tetracycline. We are going to send this to [facility] for a  topical antibiotic which is something that we have had good success with recently in large venous ulcers with a lot of purulent drainage. There would not be an easy oral alternative here possibly line escalated and ciprofloxacin if we need to use systemic antibiotic 4/15; difficult area on the left medial ankle. Most likely chronic venous insufficiency. I think she will probably need venous reflux study I think she had DVT rule outs but not venous reflux studies. We have not yet obtained the topical antibiotics. She has home health changing the dressing we have been using Sorbact for adherent fibrinous debris on the surface. Very difficult to remove 4/22; patient presents for 1 week follow-up. She has been using sore back under compression wraps and these are changed 3 times a week with home health. She also had Keystone antibiotics sent to her house and brought them in  today. She has no complaints or issues today. 5/2; patient is here for follow-up. She has been using Sorbact under compression. Very painful wound. She has been using Keystone antibiotics. Not much improvement although the more medial part of the wound has cleaned up nicely and the larger part of the wound about 50% slough covered. Part of the issue here is that she had stays at both [facility] wound care center, [facility] wound care center and now [place]. Not sure if she has had venous reflux studies. As far as we are able to tell she did not have a biopsy. My notes state that she did not have venous reflux studies just DVT rule outs. 5/16; patient goes for venous reflux studies this afternoon. She says that wound was biopsied which sounds like punch biopsies by Dr. Lynden Ang we do not have these results. We are using Sorbact. Very difficult wound to debride 5/23; patient presents for 1 week follow-up. She reports tolerating the wraps well with sorbact underneath. She had ABIs and venous reflux studies done. She has no issues or complaints today.  She denies acute signs of infection. 6/6; I have reviewed the patient's vascular studies. Wounds are on the left medial and posterior calf. She had significant reflux in the greater saphenous vein in the in the mid thigh, distal thigh knee and the small saphenous vein in the popliteal fossa. The vein diameters do not look too impressive though. She is going to see the vascular surgeon on Wednesday. She also had venous reflux in the right common femoral vein. She did not have any evidence of a DVT or SVT I am wondering whether there is an ablation procedure that would benefit her in the greater saphenous vein on the left. . She tells [place] that home health put the dressing on too tight and she took off 1 layer. The swelling in her left leg is a little worse as a result of this. Not much change in the wound measurements so the surface of the wound looks better Her arterial studies showed an ABI on the right of 1.14 with a triphasic waveform and a great toe pressure of 0.89. On the left her ABIs were noncompressible at 1.34 but with triphasic waveforms and a TBI of 0.97. Her greater toe pressure was 122. There was no evidence of significant bilateral arterial disease 6/20 patient went to see Dr. Durwin Nora. He did not think she had significant arterial disease. In terms of her venous duplex on the right side there was no evidence of a DVT or SVT there was deep venous reflux involving the common femoral vein no superficial vein reflux on the left side there was no DVT or SVT there was no deep vein graft reflux there was some reflux in the greater saphenous vein from the mid thigh to the knee but the vein here was not dilated. He thought these were venous wounds he prescribed a compression pump but I am not sure who we ordered it from The patient has been approved for Apligraf. Still using silver collagen this week 7/5; Apligraf #1 7/19 Apligraf #2. Decent improvement in the condition of the wound bed.  Epithelialization distal 8/2 Apligraf #3. No issues or complaints. Denies signs of infection. 8/17 Apligraf #4. No issues or concerns. Some complaints of pruritus and the rash. Our intake nurse brought up the fact that she had previously indicated possible cotton layer sensitivity we will therefore use kerlix in the bottom layer of the compression 8/30; the patient  comes in with the area on her left medial leg just about healed. There is a superficial area more towards the tibia and a smaller open area distally everything else is epithelialized. I do not think she requires another Apligraf 9/13; left medial leg is healed. 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 Her edema control is good Readmisstion: 05-22-2022 upon evaluation today patient presents for initial inspection here in our clinic concerning issues that she has been having with a wound of the right lateral lower extremity. This is an area of a previous skin graft she tells me. With that being said she unfortunately had a scrape on this that occurred around 10 January. Since that time she has noted that this has just continued to get bigger and bigger in her niece who is present with her today actually states that 2 [name] ago when she saw that it was significantly smaller than what she sees currently. Obviously this is of utmost concern as we do not want this to continue to get larger when arrested and get moving in the right direction. Fortunately there does not appear to be any signs of systemic infection though locally I think we probably do have some infection present. I would obtain a culture to see what we have going on here and then we will subsequently see where things go going forward. Fortunately I think that she is in the right place at this point being at the wound center we can definitely do something to try to get this moving in the right direction. Patient does have  a history of chronic venous insufficiency as well as coronary artery disease. In the past she has done well with compression wraps on the start with a 3 layer wrap I think she is probably can end up going to a 4-layer at some point but we will see how things do over the next week. She has been using wound cleanser along with she tells me a zinc and topical but again I am not sure exactly what that was. 05-29-2022 upon evaluation today patient appears to be doing well currently in regard to her wound. This actually is showing signs of improvement I am happy in that regard unfortunately her left leg has an area on the shin that has opened. This is the region where one of the areas at least we have previously taken care of. Nonetheless this is small hoping we get it under control before things worsen significantly. 06-05-2022 upon evaluation today patient appears to be doing well currently in regard to her wounds. The actually seem to be fairly clean she is having still quite a bit of problem with pain at this point she would like to not have debridement today for all possible. With that being said I do believe that we are making some good progress I think the [facility] is doing a decent job here. 3/6; patient has had 3/6; patient with known severe chronic venous insufficiency. She apparently had a traumatic wound at home and has a reasonably substantial wound on the right lateral lower leg and a smaller one on the left anterior lower leg as well. We have been using Hydrofera Blue and Unna boots 3/13; patient presents for follow-up. We have been using Hydrofera Blue under Coflex to the legs bilaterally. She has no issues or complaints today. Jamie [name], Jamie [name] ([id number]) [id number] Page 8 of 10 06-26-2022 upon evaluation today patient appears to be  doing well currently in regard to her wound. This is measuring a little bit larger however. Fortunately I do not see any  signs of infection this is on the right side. Fortunately the left side is actually completely healed which is great news. 07-03-2022 patient's wound unfortunately is continuing to show signs of being worse. Her compression which was the Tubigrip that I thought should be able to pull up actually slipped down and she was not able to pull that back up. With that being said that means that unfortunately her wound has continued to deteriorate since I last saw her. This is definitely not the direction that we are looking for. I discussed with her that I do believe we need to go ahead and see about getting her started on antibiotics and I subsequently would also like to go ahead and see about getting things moving forward with regard to a wound culture and making a change up her dressings as well but this can be changed more frequently than just once a week. 07-10-2022 upon evaluation today patient actually appears to be doing much better. I do believe that the antibiotics have been beneficial for her. Fortunately I do not see any signs of active infection locally nor systemically which is great news. No fevers, chills, nausea, vomiting, or diarrhea. 07-17-2022 upon evaluation today patient appears to be doing well currently in regard to her wound which is actually showing signs of improvement this is slow but nonetheless prevalent that we are seeing good improvement here. Fortunately I do not see any signs of active infection locally nor systemically which is great news. 07-24-2022 upon evaluation today patient appears to be doing well currently in regard to her wound although the wrap that we had on has been slipping down and she really does not have anybody to help her with this at home health is not going to be coming out we could not get anybody that would actually be able to do the dressing changes. Fortunately I do not see any signs of active infection locally nor systemically which is great news. 08-07-22  poorly in regard to her leg compared to what it was previous. Fortunately there does not appear to be any signs of active infection locally nor systemically which is great news. No fevers, chills, nausea, vomiting, or diarrhea. With that being said it does appear that the patient may have some cellulitis in the leg which is not good. 08-14-2022 upon evaluation today patient appears to be doing somewhat better in regard to her wounds she still is having quite a bit of pain we are still not where we want to be as far as healing is concerned completely. Fortunately I do not see any evidence of active infection locally nor systemically which is great news and I am very pleased in that regard. 08-21-23 upon evaluation today patient appears to be doing poorly currently in regard to her wound. She has been tolerating the dressing changes without complication. Fortunately there does not appear to be any signs of active infection locally nor systemically at this time. 08-28-2022 upon evaluation today patient appears to be doing poorly in regard to her wound this was not wrapped appropriately home health did not go up high enough on the wrap. This has caused some issues and I discussed that with the patient today. I do believe that she is going to require aggressive wrapping and treatment which she is getting the [facility] topical antibiotics today they can start using that  at the next wrap on Friday. In the meantime they need to make sure that the rapid and appropriately we wrote very specific orders today. 09-04-2022 upon evaluation today patient appears to be doing well currently in regard to her wound which I think is making progress is still hurting her quite significantly. Fortunately I do not see any signs of active infection locally or systemically which is great news. No fevers, chills, nausea, vomiting, or diarrhea. 09-11-2022 upon evaluation today patient's wound actually is showing signs of being a little  bit smaller and looking a little bit better she still has a lot going on here however. Fortunately I do not see any evidence of active infection Worsening locally nor systemically which is great news. With that being said worsening still continue to use the topical Keystone antibiotics. 09-18-2022 upon evaluation today patient actually showing some signs of improvement. I am actually very pleased with where we stand compared to where we have been. I think that she is making good headway here. She is still having quite a bit of pain but it seems to be lessening compared to previous. Objective Constitutional Chronically ill appearing but in no apparent acute distress. Vitals Time Taken: 10:06 AM, Height: 62 in, Weight: 171 lbs, BMI: 31.3, Temperature: 97.9 F, Pulse: 78 bpm, Respiratory Rate: 20 breaths/min, Blood Pressure: 122/70 mmHg. Respiratory normal breathing without difficulty. Psychiatric this patient is able to make decisions and demonstrates good insight into disease process. Alert and Oriented x 3. pleasant and cooperative. General Notes: Upon inspection patient's wound bed actually showed signs of good granulation epithelization at this point. Fortunately I do not see any signs of active infection locally or systemically which is great news and in general I do think that we are on the right track here. With that being said I did have to perform a fairly significant debridement today and the patient tolerated this debridement without complication. Postdebridement I feel like this is actually much better. This debridement was necessary and to be honest though she did have pain she seemed to tolerate this quite well. Once we were complete with this the wound overall looks much improved. Integumentary (Hair, Skin) Wound #5 status is Open. Original cause of wound was Trauma. The date acquired was: 04/17/2022. The wound has been in treatment 17 [name]. The wound is located on the Right,Lateral  Lower Leg. The wound measures 13.7cm length x 7.4cm width x 0.2cm depth; 79.624cm^2 area and 15.925cm^3 volume. There is Fat Layer (Subcutaneous Tissue) exposed. There is no tunneling or undermining noted. There is a large amount of serosanguineous drainage noted. The wound margin is distinct with the outline attached to the wound base. There is medium (34-66%) red, hyper - granulation within the wound bed. There is a medium (34- 66%) amount of necrotic tissue within the wound bed including Adherent Slough. The periwound skin appearance exhibited: Scarring, Hemosiderin Staining. The periwound skin appearance did not exhibit: Callus, Crepitus, Excoriation, Induration, Rash, Dry/Scaly, Maceration, Atrophie Blanche, Cyanosis, Ecchymosis, Mottled, Pallor, Rubor, Erythema. The periwound has tenderness on palpation. 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 There was a Excisional Skin/Subcutaneous Tissue Debridement with a total area of 67.65 sq cm performed by Jamie Kelp, PA. With the following instrument(s): Curette to remove Viable and Non-Viable tissue/material. Material removed includes Fat, Subcutaneous Tissue, Slough, and Skin: Epidermis after achieving pain control using Lidocaine 5% topical ointment. A time out was conducted at 10:30, prior to the start of the procedure. A Minimum amount of bleeding was  controlled with Pressure. The procedure was tolerated well with a pain level of 0 throughout and a pain level of 0 following the procedure. Post Debridement Measurements: 13.7cm length x 7.4cm width x 0.2cm depth; 15.925cm^3 volume. Character of Wound/Ulcer Post Debridement is improved. Severity of Tissue Post Debridement is: Fat layer exposed. Post procedure Diagnosis Wound #5: Same as Pre-Procedure Plan Follow-up Appointments: Return Appointment in 1 week. Leonard Schwartz Wednesday 09/25/2022 1015 room 8 Return Appointment in 2 [name]. Leonard Schwartz Wednesday 10/02/2022 1015 room 8 Return appointment in 3 [name]. Leonard Schwartz Wednesday 10/09/2022 1015 room 9 Return appointment in 1 month. Leonard Schwartz Wednesday 10/16/2022 1015 room 8 Other: - home health to wash right leg and foot with soap (dial soap) and water with dressing changes. Patient to have soap and towels next to her when you arrive. *****[facility] compounding topical antibiotics. FOLLOW THE PHARMACY INSTRUCTIONS ON HOW TO MIX, and apply under the hydrofera blue.****** ****PLEASE APPLY ZINC OXIDE AROUND THE PERIWOUND DUE TO MACERATION. ****Patient to bring in the [facility] topical antibiotics to each wound care appt as well.***** *****HOME HEALTH TO PLEASE WRAP FROM BASE OF TOES TO JUST BELOW KNEE. please appy first layer of unna boot at the bast of the toes and just below the knee to help hold the compression wrap in place.**** Anesthetic: Wound #5 Right,Lateral Lower Leg: (In clinic) Topical Lidocaine 5% applied to wound bed (In clinic) Topical Lidocaine 4% applied to wound bed Bathing/ Shower/ Hygiene: May shower and wash wound with soap and water. - with dressing changes. Edema Control - Lymphedema / SCD / Other: Elevate legs to the level of the heart or above for 30 minutes daily and/or when sitting for 3-4 times a day throughout the day. Avoid standing for long periods of time. Exercise regularly Home Health: Wound #5 Right,Lateral Lower Leg: No  change in wound care orders this week; continue Home Health for wound care. May utilize formulary equivalent dressing for wound treatment orders unless otherwise specified. Other Home Health Orders/Instructions: - [facility] in [place], [place] fax # [phone]. WOUND #5: - Lower Leg Wound Laterality: Right, Lateral Cleanser: Soap and Water 3 x Per Week/30 Days Discharge Instructions: May shower and wash wound with dial antibacterial soap and water prior to dressing change. Peri-Wound Care: Zinc Oxide Ointment 30g tube 3 x Per Week/30 Days Discharge Instructions: Apply Zinc Oxide to periwound with each dressing change TO ANY MACERATED SKIN. Peri-Wound Care: Sween Lotion (Moisturizing lotion) 3 x Per Week/30 Days Discharge Instructions: Apply moisturizing lotion as directed Topical: compounding topical antibiotics 3 x Per Week/30 Days Discharge Instructions: apply directly to wound bed under the hydrofera blue. Home Health start. MIX ACCORDING TO THE PHARMACY INSTRUCTIONS. Prim Dressing: Hydrofera Blue Ready Transfer Foam, 4x5 (in/in) (Generic) 3 x Per Week/30 Days ary Discharge Instructions: Apply over the topical antibiotics wound bed as instructed Secondary Dressing: ABD Pad, 8x10 3 x Per Week/30 Days Discharge Instructions: Apply over primary dressing as directed. Secondary Dressing: Woven  Gauze Sponge, Non-Sterile 4x4 in 3 x Per Week/30 Days Discharge Instructions: Apply over primary dressing as directed. Secondary Dressing: Zetuvit Plus 4x8 in 3 x Per Week/30 Days Discharge Instructions: Or double ABD pad Apply over primary dressing as directed. Com pression Wrap: Kerlix Roll 4.5x3.1 (in/yd) 3 x Per Week/30 Days Discharge Instructions: Apply Kerlix and Coban compression as directed. Com pression Wrap: Coban Self-Adherent Wrap 4x5 (in/yd) 3 x Per Week/30 Days Discharge Instructions: Apply over Kerlix as directed. Com pression Wrap: unna boot FIRST LAYER **** SEE  INSTRUCTIONS 3 x Per Week/30 Days Discharge Instructions: APPLY FIRST LAYER UNNA BOOT AT BASES OF TOES AND JUST BELOW THE KNEE TO HOLD COMPRESSION WRAP IN PLACE. 1. Based on what I am seeing I do believe that the patient is making really good progress. In general I seem to see improvement week by week especially with Jamie [name], Jamie [name] ([id number]) [id number] Page 10 of 10 the debridements in the patient's wound which is great news. I am actually very pleased with where things stand. 2. I would recommend as well that the patient should continue to utilize the [employer] wraps. Will also get a continue with the [facility] which I think is doing quite well using the [facility] topical antibiotics along with this. 3. Muscle good recommend the patient should continue to elevate her legs as much as possible. We will see patient back for reevaluation in 1 week here in the clinic. If anything worsens or changes patient will contact our office for additional recommendations. Electronic Signature(s) Signed: 09/18/2022 3:00:06 PM By: Jamie Derry PA-C Entered By: Jamie [name] on 09/18/2022 15:00:06 -------------------------------------------------------------------------------- SuperBill Details Patient Name: Date of Service: Jamie [name], Harrell Gave [name]. 09/18/2022 Medical Record Number: [id number] Patient Account Number: [id number] Date of Birth/Sex: Treating RN: 02-27-1941 (82 y.o. 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-pressure chronic ulcer of other part of left lower leg  with fat layer exposed I25.10 Atherosclerotic heart disease of native coronary artery without angina pectoris Facility Procedures : CPT4 Code: [id number] Description: 11042 - DEB SUBQ TISSUE 20 SQ CM/< ICD-10 Diagnosis Description L97.812 Non-pressure chronic ulcer of other part of right lower leg with fat layer expo Modifier: sed Quantity: 1 : CPT4 Code: [id number] Description: 11045 - DEB SUBQ TISS EA ADDL 20CM ICD-10 Diagnosis Description L97.812 Non-pressure chronic ulcer of other part of right lower leg with fat layer expo Modifier: sed Quantity: 3 Physician Procedures : CPT4 Code Description Modifier [id number] 11042 - WC PHYS SUBQ TISS 20 SQ CM ICD-10 Diagnosis Description 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d Quantity: 1 : [id number] 11045 - WC PHYS SUBQ TISS EA ADDL 20 CM ICD-10 Diagnosis Description 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d Quantity: 3 Electronic Signature(s) Signed: 09/18/2022 3:00:57 PM By: Jamie Derry PA-C Entered By: Jamie [name] on 09/18/2022 15:00:57

## 2022-09-21 NOTE — Progress Notes (Signed)
KHALESSI, SCHLARB ([id number]) [id number] Page 1 of 7 Visit Report for 09/18/2022 Arrival Information Details Patient Name: Date of Service: Jamie [name], [name]. 09/18/2022 10:15 A M Medical Record Number: [id number] Patient Account Number: [id number] Date of Birth/Sex: Treating RN: 10-Nov-1940 (82 y.o. Jamie [name] Primary Care Dahir Ayer: [name] Other Clinician: Referring Navayah Sok: Treating Peta Peachey/Extender: [name], Billey Gosling in Treatment: 17 Visit Information History Since Last Visit Added or deleted any medications: No Patient Arrived: Wheel Chair Any new allergies or adverse reactions: No Arrival Time: 10:04 Had a fall or experienced change in No Accompanied By: niece activities of daily living that may affect Transfer Assistance: Manual risk of falls: Patient Identification Verified: Yes Signs or symptoms of abuse/neglect since last visito No Secondary Verification Process Completed: Yes Hospitalized since last visit: No Patient Requires Transmission-Based Precautions: No Implantable device outside of the clinic excluding No Patient Has Alerts: No cellular tissue based products placed in the center since last visit: Has Dressing in Place as Prescribed: Yes Has Compression in Place as Prescribed: Yes Pain Present Now: Yes Electronic Signature(s) Signed: 09/18/2022 4:59:51 PM By: Zenaida Deed RN, BSN Entered By: Zenaida Deed on 09/18/2022 10:06:05 -------------------------------------------------------------------------------- Encounter Discharge Information Details Patient Name: Date of Service: Jamie [name], [name] 09/18/2022 10:15 A M Medical Record Number: [id number] Patient Account Number: [id number] Date of Birth/Sex: Treating RN: 28-Jan-1941 (82 y.o. Jamie [name] Primary Care Jamie [name]: [name] Other Clinician: Referring Jamie [name]: Treating Jamie [name]/Extender:  [name], Jamie [name] in Treatment: 17 Encounter Discharge Information Items Post Procedure Vitals Discharge Condition: Stable Temperature (F): 97.9 Ambulatory Status: Wheelchair Pulse (bpm): 78 Discharge Destination: Home Respiratory Rate (breaths/min): 20 Transportation: Private Auto Blood Pressure (mmHg): 122/70 Accompanied By: niece Schedule Follow-up Appointment: Yes Clinical Summary of Care: Electronic Signature(s) Signed: 09/19/2022 5:31:32 PM By: Shawn Stall RN, BSN Entered By: Shawn Stall on 09/18/2022 10:41:20 [name] ([id number]) [id number] Page 2 of 7 -------------------------------------------------------------------------------- Lower Extremity Assessment Details Patient Name: Date of Service: Jamie [name], [name]. 09/18/2022 10:15 A M Medical Record Number: [id number] Patient Account Number: [id number] Date of Birth/Sex: Treating RN: 08/01/1940 (82 y.o. Jamie [name] Primary Care Kailan [name]: [name] Other Clinician: Referring Jamie [name]: Treating Jamie [name]/Extender: [name], Jamie [name] in Treatment: 17 Edema Assessment Assessed: [Left: No] [Right: No] Edema: [Left: Ye] [Right: s] Calf Left: Right: Point of Measurement: From Medial Instep 35 cm Ankle Left: Right: Point of Measurement: From Medial Instep 25.5 cm Vascular Assessment Pulses: Dorsalis Pedis Palpable: [Right:Yes] Electronic Signature(s) Signed: 09/18/2022 4:59:51 PM By: Zenaida Deed RN, BSN Entered By: Zenaida Deed on 09/18/2022 10:11:48 -------------------------------------------------------------------------------- Multi-Disciplinary Care Plan Details Patient Name: Date of Service: Jamie Brink, MA [name] 09/18/2022 10:15 A M Medical Record Number: [id number] Patient Account Number: [id number] Date of Birth/Sex: Treating RN: 1940-12-11 (82 y.o. Jamie [name] Primary Care  Jamie [name]: [name] Other Clinician: Referring Quantavis Obryant: Treating Jamie [name]/Extender: [name], Jamie [name] in Treatment: 17 Active Inactive Pain, Acute or Chronic Nursing Diagnoses: Pain Management - Cyclic Acute (Dressing Change Related) Pain Management - Non-cyclic Acute (Procedural) Pain, acute or chronic: actual or potential Goals: Patient will verbalize adequate pain control and receive pain control interventions during procedures as needed Date Initiated: 09/11/2022 Target Resolution Date: 10/11/2022 Goal Status: Active Patient/caregiver will verbalize adequate pain control between visits Date Initiated: 09/11/2022 Target Resolution Date: 10/11/2022 Goal Status: Active Jamie, [name] ([id number]) [id number] Page 3 of 7 Patient/caregiver will verbalize comfort level met  Date Initiated: 09/11/2022 Target Resolution Date: 10/11/2022 Goal Status: Active Interventions: Complete pain assessment as per visit requirements Encourage patient to take pain medications as prescribed Provide education on pain management Provision of support: recognize patient pain, provide comfort and support as needed Reposition patient for comfort Treatment Activities: Administer pain control measures as ordered : 09/11/2022 Notes: Jamie [name] Nursing Diagnoses: Actual Jamie Insuffiency (use after diagnosis is confirmed) Knowledge deficit related to disease process and management Goals: Patient will maintain optimal edema control Date Initiated: 05/22/2022 Target Resolution Date: 10/04/2022 Goal Status: Active Patient/caregiver will verbalize understanding of disease process and disease management Date Initiated: 05/22/2022 Target Resolution Date: 10/04/2022 Goal Status: Active Interventions: Assess peripheral edema status every visit. Compression as ordered Provide education on Jamie insufficiency Notes: Wound/Skin  Impairment Nursing Diagnoses: Impaired tissue integrity Knowledge deficit related to ulceration/compromised skin integrity Goals: Patient/caregiver will verbalize understanding of skin care regimen Date Initiated: 05/22/2022 Target Resolution Date: 10/04/2022 Goal Status: Active [name]/skin breakdown will have a volume reduction of 30% by week 4 Date Initiated: 05/22/2022 Date Inactivated: 07/10/2022 Target Resolution Date: 07/06/2022 Unmet Reason: see wound Goal Status: Unmet measurement. Interventions: Assess patient/caregiver ability to obtain necessary supplies Assess patient/caregiver ability to perform [name]/skin care regimen upon admission and as needed Assess ulceration(s) every visit Provide education on [name] and skin care Notes: Electronic Signature(s) Signed: 09/19/2022 5:31:32 PM By: Shawn Stall RN, BSN Entered By: Shawn Stall on 09/18/2022 10:13:54 -------------------------------------------------------------------------------- Pain Assessment Details Patient Name: Date of Service: Jamie [name], [name] 09/18/2022 10:15 A M Medical Record Number: [id number] Patient Account Number: [id number] NARITA, URBACH ([id number]) [id number] Page 4 of 7 Date of Birth/Sex: Treating RN: 06/22/1940 (82 y.o. Jamie [name] Primary Care Namya Voges: [name] Other Clinician: Referring Donavon Kimrey: Treating Epifanio Labrador/Extender: [name] [name] in Treatment: 17 Active Problems Location of Pain Severity and Description of Pain Patient Has Paino Yes Site Locations Pain Location: Pain in Ulcers With Dressing Change: Yes Duration of the Pain. Constant / Intermittento Constant Rate the pain. Current Pain Level: 8 Worst Pain Level: 10 Least Pain Level: 8 Character of Pain Describe the Pain: Stabbing, Throbbing Pain Management and Medication Current Pain Management: Medication: Yes Is the Current Pain  Management Adequate: Adequate How does your wound impact your activities of daily livingo Sleep: Yes Bathing: No Appetite: No Relationship With Others: No Bladder Continence: No Emotions: Yes Bowel Continence: No Hobbies: Yes Toileting: No Dressing: No Electronic Signature(s) Signed: 09/18/2022 4:59:51 PM By: Zenaida Deed RN, BSN Entered By: Zenaida Deed on 09/18/2022 10:07:48 -------------------------------------------------------------------------------- Patient/Caregiver Education Details Patient Name: Date of Service: [name] 6/12/2024andnbsp10:15 A M Medical Record Number: [id number] Patient Account Number: [id number] Date of Birth/Gender: Treating RN: [birth year] (82 y.o. Jamie [name] Primary Care Physician: [name] Other Clinician: Referring Physician: Treating Physician/Extender: [name], Billey Gosling in Treatment: 17 Education Assessment Education Provided To: Patient Education Topics Provided Pain: Handouts: A Guide to [address] VAL, TIBERI ([id number]) [id number] Page 5 of 7 Methods: Explain/Verbal Responses: Reinforcements needed Wound/Skin Impairment: Handouts: Caring for Your [name] Methods: Explain/Verbal Responses: Reinforcements needed Electronic Signature(s) Signed: 09/19/2022 5:31:32 PM By: Shawn Stall RN, BSN Entered By: Shawn Stall on 09/18/2022 10:14:14 -------------------------------------------------------------------------------- Wound Assessment Details Patient Name: Date of Service: Jamie [name], [name] 09/18/2022 10:15 A M Medical Record Number: [id number] Patient Account Number: [id number] Date of Birth/Sex: Treating RN: 08/22/1940 (82 y.o. Jamie [name] Primary Care Florena Kozma: [name] Other Clinician: Referring Ulysess Witz:  Treating Bridey Brookover/Extender: [name], Jamie [name] in Treatment:  17 Wound Status Wound Number: 5 Primary Jamie [name] Etiology: Wound Location: Right, Lateral Lower Leg Wound Open Wounding Event: Trauma Status: Date Acquired: 04/17/2022 Comorbid Sleep Apnea, Hypertension, Peripheral Jamie Disease, [name] Of Treatment: 17 History: Osteoarthritis, Neuropathy Clustered Wound: No Photos Wound Measurements Length: (cm) 13.7 Width: (cm) 7.4 Depth: (cm) 0.2 Area: (cm) 79.624 Volume: (cm) 15.925 % Reduction in Area: -204.3% % Reduction in Volume: -102.8% Epithelialization: Small (1-33%) Tunneling: No Undermining: No Wound Description Classification: Full Thickness Without Exposed Support Wound Margin: Distinct, outline attached Exudate Amount: Large Exudate Type: Serosanguineous Exudate Color: red, brown Structures Foul Odor After Cleansing: No Slough/Fibrino No Wound Bed Granulation Amount: Medium (34-66%) Exposed Structure Granulation Quality: Red, Hyper-granulation Fascia Exposed: No Necrotic Amount: Medium (34-66%) Fat Layer (Subcutaneous Tissue) Exposed: Yes Necrotic Quality: Adherent Slough Tendon Exposed: No Muscle Exposed: No Joint Exposed: No EVILYN, KAZARIAN ([id number]) [id number] Page 6 of 7 Bone Exposed: No Periwound Skin Texture Texture Color No Abnormalities Noted: No No Abnormalities Noted: No Callus: No Atrophie Blanche: No Crepitus: No Cyanosis: No Excoriation: No Ecchymosis: No Induration: No Erythema: No Rash: No Hemosiderin Staining: Yes Scarring: Yes Mottled: No Pallor: No Moisture Rubor: No No Abnormalities Noted: No Dry / Scaly: No Temperature / Pain Maceration: No Tenderness on Palpation: Yes Treatment Notes Wound #5 (Lower Leg) Wound Laterality: Right, Lateral Cleanser Soap and Water Discharge Instruction: May shower and wash wound with dial antibacterial soap and water prior to dressing change. Peri-Wound Care Zinc Oxide Ointment 30g tube Discharge  Instruction: Apply Zinc Oxide to periwound with each dressing change TO ANY MACERATED SKIN. Sween Lotion (Moisturizing lotion) Discharge Instruction: Apply moisturizing lotion as directed Topical compounding topical antibiotics Discharge Instruction: apply directly to wound bed under the hydrofera blue. Home Health start. MIX ACCORDING TO THE PHARMACY INSTRUCTIONS. Primary Dressing Hydrofera Blue Ready Transfer Foam, 4x5 (in/in) Discharge Instruction: Apply over the topical antibiotics wound bed as instructed Secondary Dressing ABD Pad, 8x10 Discharge Instruction: Apply over primary dressing as directed. Woven Gauze Sponge, Non-Sterile 4x4 in Discharge Instruction: Apply over primary dressing as directed. Zetuvit Plus 4x8 in Discharge Instruction: Or double ABD pad Apply over primary dressing as directed. Secured With Compression Wrap Kerlix Roll 4.5x3.1 (in/yd) Discharge Instruction: Apply Kerlix and Coban compression as directed. Coban Self-Adherent Wrap 4x5 (in/yd) Discharge Instruction: Apply over Kerlix as directed. unna boot FIRST LAYER **** SEE INSTRUCTIONS Discharge Instruction: APPLY FIRST LAYER UNNA BOOT AT BASES OF TOES AND JUST BELOW THE KNEE TO HOLD COMPRESSION WRAP IN PLACE. Compression Stockings Add-Ons Electronic Signature(s) Signed: 09/19/2022 5:31:32 PM By: Shawn Stall RN, BSN Entered By: Shawn Stall on 09/18/2022 10:18:15 [name] ([id number]) [id number] Page 7 of 7 -------------------------------------------------------------------------------- Vitals Details Patient Name: Date of Service: Jamie [name], [name]. 09/18/2022 10:15 A M Medical Record Number: [id number] Patient Account Number: [id number] Date of Birth/Sex: Treating RN: 1940/10/24 (82 y.o. Jamie [name] Primary Care Angelis Gates: [name] Other Clinician: Referring Butler Vegh: Treating Vena Bassinger/Extender: [name],  Jamie [name] in Treatment: 17 Vital Signs Time Taken: 10:06 Temperature (F): 97.9 Height (in): 62 Pulse (bpm): 78 Weight (lbs): 171 Respiratory Rate (breaths/min): 20 Body Mass Index (BMI): 31.3 Blood Pressure (mmHg): 122/70 Reference Range: 80 - 120 mg / dl Electronic Signature(s) Signed: 09/18/2022 4:59:51 PM By: Zenaida Deed RN, BSN Entered By: Zenaida Deed on 09/18/2022 10:06:37

## 2022-09-25 ENCOUNTER — Encounter (HOSPITAL_BASED_OUTPATIENT_CLINIC_OR_DEPARTMENT_OTHER): Payer: Medicare HMO | Admitting: Physician Assistant

## 2022-09-25 DIAGNOSIS — I87331 Chronic venous hypertension (idiopathic) with ulcer and inflammation of right lower extremity: Secondary | ICD-10-CM | POA: Diagnosis not present

## 2022-09-25 NOTE — Progress Notes (Addendum)
Jamie [name], Jamie [name] ([id number]) [id number] Page 1 of 5 Visit Report for 09/25/2022 Chief Complaint Document Details Patient Name: Date of Service: Jamie [name], [name]. 09/25/2022 10:15 A M Medical Record Number: [id number] Patient Account Number: [id number] Date of Birth/Sex: Treating RN: Aug 09, 1940 (82 y.o. F) Primary Care Provider: [name] Other Clinician: Referring Provider: Treating Provider/Extender: [name], [name] in Treatment: 18 Information Obtained from: Patient Chief Complaint Bilateral LE Ulcers Electronic Signature(s) Signed: 09/25/2022 9:57:54 AM By: Allen Derry PA-C Entered By: Allen Derry on 09/25/2022 09:57:54 -------------------------------------------------------------------------------- Debridement Details Patient Name: Date of Service: Jamie [name], [name] 09/25/2022 10:15 A M Medical Record Number: [id number] Patient Account Number: [id number] Date of Birth/Sex: Treating RN: 1940-11-04 (82 y.o. Jamie [name], Jamie [name] Primary Care Provider: [name] Other Clinician: Referring Provider: Treating Provider/Extender: [name], [name] in Treatment: 18 Debridement Performed for Assessment: Wound #5 Right,Lateral Lower Leg Performed By: Physician Lenda Kelp, PA Debridement Type: Debridement Severity of Tissue Pre Debridement: Fat layer exposed Level of Consciousness (Pre-procedure): Awake and Alert Pre-procedure Verification/Time Out Yes - 11:08 Taken: Start Time: 11:09 Pain Control: Lidocaine 4% T opical Solution Percent of Wound Bed Debrided: 80% T Area Debrided (cm): otal 76.3 Tissue and other material debrided: Viable, Non-Viable, Slough, Subcutaneous, Skin: Epidermis, Biofilm, Slough Level: Skin/Subcutaneous Tissue Debridement Description: Excisional Instrument: Curette Bleeding: Minimum Hemostasis Achieved: Pressure End Time:  11:15 Procedural Pain: 3 Post Procedural Pain: 4 Response to Treatment: Procedure was tolerated well Level of Consciousness (Post- Awake and Alert procedure): Post Debridement Measurements of Total Wound Length: (cm) 13.5 Width: (cm) 9 Depth: (cm) 0.2 Volume: (cm) 19.085 Character of Wound/Ulcer Post Debridement: Improved Jamie [name], Jamie [name] ([id number]) [id number] Page 2 of 5 Severity of Tissue Post Debridement: Fat layer exposed Post Procedure Diagnosis Same as Pre-procedure Electronic Signature(s) Signed: 09/25/2022 3:27:24 PM By: Shawn Stall RN, BSN Signed: 09/26/2022 5:52:49 PM By: Allen Derry PA-C Entered By: Shawn Stall on 09/25/2022 11:15:25 -------------------------------------------------------------------------------- Physician Orders Details Patient Name: Date of Service: Jamie [name], [name] 09/25/2022 10:15 A M Medical Record Number: [id number] Patient Account Number: [id number] Date of Birth/Sex: Treating RN: [birth year] (82 y.o. 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 Leonard Schwartz Wednesday 10/02/2022 1015 room 8 Return A ppointment in 2 weeks. Leonard Schwartz Wednesday 10/09/2022 1015 room 9 Return A Return appointment in 3 weeks. Leonard Schwartz Wednesday 10/16/2022 1015 room 8 Other: - home health to wash right leg and  foot with soap (dial soap) and water with dressing changes. Patient to have soap and towels next to her when you arrive. *****[facility] compounding topical antibiotics. FOLLOW THE PHARMACY INSTRUCTIONS ON HOW TO MIX, and apply under the hydrofera blue.****** ****PLEASE APPLY ZINC OXIDE AROUND THE PERIWOUND DUE TO MACERATION. ****Patient to bring in the [facility] topical antibiotics to each wound care appt as well.***** *****HOME HEALTH TO PLEASE WRAP FROM BASE OF TOES TO JUST BELOW KNEE. please appy first layer of unna boot at the bast of  the toes and just below the knee to help hold the compression wrap in place.**** Will fax Dr. Lucretia Field at [facility] on [address] your office notes. Anesthetic Wound #5 Right,Lateral Lower Leg (In clinic) Topical Lidocaine 5% applied to wound bed (In clinic) Topical Lidocaine 4% applied to wound bed Bathing/ Shower/ Hygiene May shower and wash wound with soap and water. - with dressing changes. Edema Control - Lymphedema / SCD / Other Right Lower Extremity Elevate legs to the level of the heart or above for 30 minutes daily and/or when sitting for 3-4 times a day throughout the day. Avoid standing for long periods of time. Exercise regularly Home Health Wound #5 Right,Lateral Lower Leg No change in wound care orders this week; continue Home Health for wound care. May utilize formulary equivalent dressing for wound treatment orders unless otherwise specified. Other Home Health Orders/Instructions: - [facility] in [place], [place] fax # [phone]. Wound Treatment Jamie [name], Jamie [name] ([id number]) [id number] Page 3 of 5 Wound #5 - Lower Leg Wound Laterality: Right, Lateral Cleanser: Soap and Water 3 x Per Week/30 Days Discharge Instructions: May shower and wash wound with dial antibacterial soap and water prior to dressing change. Peri-Wound Care: Zinc Oxide Ointment 30g tube 3 x Per  Week/30 Days Discharge Instructions: Apply Zinc Oxide to periwound with each dressing change TO ANY MACERATED SKIN. Peri-Wound Care: Sween Lotion (Moisturizing lotion) 3 x Per Week/30 Days Discharge Instructions: Apply moisturizing lotion as directed Topical: compounding topical antibiotics 3 x Per Week/30 Days Discharge Instructions: apply directly to wound bed under the hydrofera blue. Home Health start. MIX ACCORDING TO THE PHARMACY INSTRUCTIONS. Prim Dressing: Hydrofera Blue Ready Transfer Foam, 4x5 (in/in) (Generic) 3 x Per Week/30 Days ary Discharge Instructions: Apply over the topical antibiotics wound bed as instructed Secondary Dressing: ABD Pad, 8x10 3 x Per Week/30 Days Discharge Instructions: Apply over primary dressing as directed. Secondary Dressing: Woven Gauze Sponge, Non-Sterile 4x4 in 3 x Per Week/30 Days Discharge Instructions: Apply over primary dressing as directed. Secondary Dressing: Zetuvit Plus 4x8 in 3 x Per Week/30 Days Discharge Instructions: Or double ABD pad Apply over primary dressing as directed. Compression Wrap: Kerlix Roll 4.5x3.1 (in/yd) 3 x Per Week/30 Days Discharge Instructions: Apply Kerlix and Coban compression as directed. Compression Wrap: Coban Self-Adherent Wrap 4x5 (in/yd) 3 x Per Week/30 Days Discharge Instructions: Apply over Kerlix as directed. Compression Wrap: unna boot FIRST LAYER **** SEE INSTRUCTIONS 3 x Per Week/30 Days Discharge Instructions: APPLY FIRST LAYER UNNA BOOT AT BASES OF TOES AND JUST BELOW THE KNEE TO HOLD COMPRESSION WRAP IN PLACE. Electronic Signature(s) Signed: 09/25/2022 3:27:24 PM By: Shawn Stall RN, BSN Signed: 09/26/2022 5:52:49 PM By: Allen Derry PA-C Entered By: Shawn Stall on 09/25/2022 11:14:10 -------------------------------------------------------------------------------- Problem List Details Patient Name: Date of Service: Jamie [name], [name] 09/25/2022 10:15 A M Medical Record Number:  [id number] Patient Account Number: [id number] Date of Birth/Sex: Treating RN: 22-May-1940 (82 y.o. F) Primary Care Provider: [name] Other Clinician: Referring Provider: Treating Provider/Extender: [name], [name] in Treatment: 18 Active Problems ICD-10 Encounter Code Description Active Date MDM Diagnosis I87.331 Chronic venous hypertension (idiopathic) with ulcer and inflammation of right 05/22/2022 No Yes lower extremity L97.812 Non-pressure chronic ulcer of other part of right lower leg with fat layer 05/22/2022 No Yes exposed L97.822 Non-pressure chronic ulcer of other part of left lower leg with fat layer exposed2/21/2024 No Yes Jamie [name], Jamie [name] ([id number]) [id number] Page 4 of 5 I25.10 Atherosclerotic heart  disease of native coronary artery without angina pectoris 05/22/2022 No Yes Inactive Problems Resolved Problems Electronic Signature(s) Signed: 09/25/2022 9:57:48 AM By: Allen Derry PA-C Entered By: Allen Derry on 09/25/2022 09:57:48 -------------------------------------------------------------------------------- SuperBill Details Patient Name: Date of Service: Jamie Brink, [name] 09/25/2022 Medical Record Number: [id number] Patient Account Number: [id number] Date of Birth/Sex: Treating RN: Oct 27, 1940 (82 y.o. 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 L97.822 Non-pressure chronic ulcer of other part of left lower leg with fat layer exposed I25.10 Atherosclerotic heart disease of native coronary artery  without angina pectoris Facility Procedures : CPT4 Code: [id number] Description: 11042 - DEB SUBQ TISSUE 20 SQ CM/< ICD-10 Diagnosis Description L97.812 Non-pressure chronic ulcer of other part of right lower leg with fat layer exp I87.331 Chronic venous hypertension (idiopathic) with ulcer and inflammation of right Modifier: osed lower extremity Quantity: 1 : CPT4 Code: [id number] Description: 11045 - DEB SUBQ TISS EA ADDL 20CM ICD-10 Diagnosis Description L97.812 Non-pressure chronic ulcer of other part of right lower leg with fat layer exp I87.331 Chronic venous hypertension (idiopathic) with ulcer and inflammation of right Modifier: osed lower extremity Quantity: 3 Physician Procedures : CPT4 Code Description Modifier [id number] 11042 - WC PHYS SUBQ TISS 20 SQ CM ICD-10 Diagnosis Description 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d I87.331 Chronic venous hypertension (idiopathic) with  ulcer and inflammation of right lower extremity Quantity: 1 : [id number] 11045 - WC PHYS SUBQ TISS EA ADDL 20 CM ICD-10 Diagnosis Description 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d I87.331 Chronic venous hypertension (idiopathic) with ulcer and inflammation of  right lower extremity Quantity: 3 Electronic Signature(s) Signed: 09/25/2022 3:27:24 PM By: Shawn Stall RN, BSN Jamie [name] ([id number]) [id number].pdf Page 5 of 5 Signed: 09/26/2022 5:52:49 PM By: Allen Derry PA-C Entered By: Shawn Stall on 09/25/2022 11:18:05

## 2022-09-27 NOTE — Progress Notes (Signed)
Jamie [name] ([id number]) [id number] Page 1 of 6 Visit Report for 09/25/2022 Arrival Information Details Patient Name: Date of Service: Jamie [name], [name]. 09/25/2022 10:15 A M Medical Record Number: [id number] Patient Account Number: [id number] Date of Birth/Sex: Treating RN: 20-Jamie-[name] (82 y.o. F) Primary Care Jamie [name]: Jamie [name] Other Clinician: Referring Jamie [name]: Treating Jamie [name]/Extender: Jamie [name] in Treatment: 18 Visit Information History Since Last Visit Added or deleted any medications: No Patient Arrived: Wheel Chair Any new allergies or adverse reactions: No Arrival Time: 10:27 Had a fall or experienced change in No Accompanied By: niece activities of daily living that may affect Transfer Assistance: None risk of falls: Patient Identification Verified: Yes Signs or symptoms of abuse/neglect since last visito No Secondary Verification Process Completed: Yes Hospitalized since last visit: No Patient Requires Transmission-Based Precautions: No Implantable device outside of the clinic excluding No Patient Has Alerts: No cellular tissue based products placed in the center since last visit: Has Dressing in Place as Prescribed: Yes Has Compression in Place as Prescribed: Yes Pain Present Now: Yes Electronic Signature(s) Signed: 09/27/2022 2:31:37 PM By: Jamie [name] Entered By: Jamie [name] on 09/25/2022 10:35:56 -------------------------------------------------------------------------------- Encounter Discharge Information Details Patient Name: Date of Service: Jamie [name], Jamie [name] 09/25/2022 10:15 A M Medical Record Number: [id number] Patient Account Number: [id number] Date of Birth/Sex: Treating RN: Jamie [name] (82 y.o. [name] Primary Care Jaelynn Currier: Jamie [name] Other Clinician: Referring Britney Newstrom: Treating Marquan Vokes/Extender: Jamie [name] in Treatment: 18 Encounter Discharge Information Items Post Procedure Vitals Discharge Condition: Stable Temperature (F): 97.7 Ambulatory Status: Wheelchair Pulse (bpm): 52 Discharge Destination: Home Respiratory Rate (breaths/min): 18 Transportation: Private Auto Blood Pressure (mmHg): 129/73 Accompanied By: Niece Schedule Follow-up Appointment: Yes Clinical Summary of Care: Electronic Signature(s) Signed: 09/25/2022 3:27:24 PM By: Jamie Stall RN, BSN Entered By: Jamie [name] on 09/25/2022 11:22:57 Jamie [name] ([id number]) [id number] Page 2 of 6 -------------------------------------------------------------------------------- Lower Extremity Assessment Details Patient Name: Date of Service: Jamie [name], [name]. 09/25/2022 10:15 A M Medical Record Number: [id number] Patient Account Number: [id number] Date of Birth/Sex: Treating RN: [name] (82 y.o. F) Primary Care Chakara Bognar: Jamie [name] Other Clinician: Referring Janisha Bueso: Treating Lillia Lengel/Extender: Jamie [name] in Treatment: 18 Edema Assessment Assessed: [Left: No] [Right: No] Edema: [Left: Ye] [Right: s] Calf Left: Right: Point of Measurement: From Medial Instep 35 cm Ankle Left: Right: Point of Measurement: From Medial Instep 24.5 cm Electronic Signature(s) Signed: 09/27/2022 2:31:37 PM By: Jamie [name] Entered By: Jamie [name] on 09/25/2022 10:54:58 -------------------------------------------------------------------------------- Multi-Disciplinary Care Plan Details Patient Name: Date of Service: Jamie Brink, MA Jamie W. 09/25/2022 10:15 A M Medical Record Number: [id number] Patient Account Number: [id number] Date of Birth/Sex: Treating RN: Jamie [name] (82 y.o. [name] Primary Care Tamanna Whitson: Jamie [name] Other Clinician: Referring Zeva Leber: Treating Finnlee Guarnieri/Extender:  Jamie [name] in Treatment: 18 Active Inactive Pain, Acute or Chronic Nursing Diagnoses: Pain Management - Cyclic Acute (Dressing Change Related) Pain Management - Non-cyclic Acute (Procedural) Pain, acute or chronic: actual or potential Goals: Patient will verbalize adequate pain control and receive pain control interventions during procedures as needed Date Initiated: 09/11/2022 Target Resolution Date: 10/11/2022 Goal Status: Active Patient/caregiver will verbalize adequate pain control between visits Date Initiated: 09/11/2022 Target Resolution Date: 10/11/2022 Goal Status: Active Patient/caregiver will verbalize comfort level met Date Initiated: 09/11/2022 Target Resolution Date: 10/11/2022 Goal Status: Active Interventions: Complete pain assessment as per visit requirements Encourage patient to take pain medications  as prescribed Jamie [name], Jamie [name] ([id number]) [id number] Page 3 of 6 Provide education on pain management Provision of support: recognize patient pain, provide comfort and support as needed Reposition patient for comfort Treatment Activities: Administer pain control measures as ordered : 09/11/2022 Notes: Electronic Signature(s) Signed: 09/25/2022 3:27:24 PM By: Jamie Stall RN, BSN Entered By: Jamie [name] on 09/25/2022 11:14:23 -------------------------------------------------------------------------------- Pain Assessment Details Patient Name: Date of Service: Jamie [name], Jamie [name] 09/25/2022 10:15 A M Medical Record Number: [id number] Patient Account Number: [id number] Date of Birth/Sex: Treating RN: [name]-[birth year] (82 y.o. F) Primary Care Iran Kievit: Jamie [name] Other Clinician: Referring Enis Riecke: Treating Ellary Casamento/Extender: Jamie [name] in Treatment: 18 Active Problems Location of Pain Severity and Description of Pain Patient Has Paino Yes Site  Locations Pain Location: Pain in Ulcers Rate the pain. Current Pain Level: 10 Pain Management and Medication Current Pain Management: Electronic Signature(s) Signed: 09/27/2022 2:31:37 PM By: Jamie [name] Entered By: Jamie [name] on 09/25/2022 10:48:43 -------------------------------------------------------------------------------- Patient/Caregiver Education Details Patient Name: Date of Service: Jamie [name] 6/19/2024andnbsp10:15 A M Medical Record Number: [id number] Patient Account Number: [id number] Date of Birth/Gender: Treating RN: [birth year] (82 y.o. [address] [place], [place] ([id number]) [id number] Page 4 of 6 Primary Care Physician: Jamie [name] Other Clinician: Referring Physician: Treating Physician/Extender: Jamie [name], Billey Gosling in Treatment: 18 Education Assessment Education Provided To: Patient and Caregiver Education Topics Provided Wound Debridement: Handouts: Wound Debridement Methods: Explain/Verbal Responses: Reinforcements needed Electronic Signature(s) Signed: 09/25/2022 3:27:24 PM By: Jamie Stall RN, BSN Entered By: Jamie [name] on 09/25/2022 11:14:35 -------------------------------------------------------------------------------- Wound Assessment Details Patient Name: Date of Service: Jamie [name], Jamie [name] 09/25/2022 10:15 A M Medical Record Number: [id number] Patient Account Number: [id number] Date of Birth/Sex: Treating RN: [name] (82 y.o. F) Primary Care Nachelle Negrette: Jamie [name] Other Clinician: Referring Elvert Cumpton: Treating Marcelino Campos/Extender: Jamie [name] in Treatment: 18 Wound Status Wound Number: 5 Primary Venous Leg Ulcer Etiology: Wound Location: Right, Lateral Lower Leg Wound Open Wounding Event: Trauma Status: Date Acquired: 04/17/2022 Comorbid Sleep Apnea, Hypertension, Peripheral Venous  Disease, [name] Of Treatment: 18 History: Osteoarthritis, Neuropathy Clustered Wound: Yes Photos Wound Measurements Length: (cm) Width: (cm) Depth: (cm) Clustered Quantity: Area: (cm) Volume: (cm) 13.5 % Reduction in Area: -264.7% 9 % Reduction in Volume: -143.1% 0.2 Epithelialization: Small (1-33%) 4 Tunneling: No 95.426 Undermining: No 19.085 Wound Description Classification: Full Thickness Without Exposed Support Structures Wound Margin: Distinct, outline attached Exudate Amount: Large Exudate Type: Serosanguineous Jamie [name], Jamie [name] ([id number]) Exudate Color: red, brown Foul Odor After Cleansing: No Slough/Fibrino No [id number] Page 5 of 6 Wound Bed Granulation Amount: Medium (34-66%) Exposed Structure Granulation Quality: Red, Hyper-granulation Fascia Exposed: No Necrotic Amount: Medium (34-66%) Fat Layer (Subcutaneous Tissue) Exposed: Yes Necrotic Quality: Adherent Slough Tendon Exposed: No Muscle Exposed: No Joint Exposed: No Bone Exposed: No Periwound Skin Texture Texture Color No Abnormalities Noted: No No Abnormalities Noted: No Callus: No Atrophie Blanche: No Crepitus: No Cyanosis: No Excoriation: No Ecchymosis: No Induration: No Erythema: No Rash: No Hemosiderin Staining: Yes Scarring: Yes Mottled: No Pallor: No Moisture Rubor: No No Abnormalities Noted: No Dry / Scaly: No Temperature / Pain Maceration: No Tenderness on Palpation: Yes Treatment Notes Wound #5 (Lower Leg) Wound Laterality: Right, Lateral Cleanser Soap and Water Discharge Instruction: May shower and wash wound with dial antibacterial soap and water prior to dressing change. Peri-Wound Care Zinc Oxide Ointment 30g tube Discharge Instruction: Apply Zinc Oxide to periwound with each dressing change TO  ANY MACERATED SKIN. Sween Lotion (Moisturizing lotion) Discharge Instruction: Apply moisturizing lotion as directed Topical compounding topical  antibiotics Discharge Instruction: apply directly to wound bed under the hydrofera blue. Home Health start. MIX ACCORDING TO THE PHARMACY INSTRUCTIONS. Primary Dressing Hydrofera Blue Ready Transfer Foam, 4x5 (in/in) Discharge Instruction: Apply over the topical antibiotics wound bed as instructed Secondary Dressing ABD Pad, 8x10 Discharge Instruction: Apply over primary dressing as directed. Woven Gauze Sponge, Non-Sterile 4x4 in Discharge Instruction: Apply over primary dressing as directed. Zetuvit Plus 4x8 in Discharge Instruction: Or double ABD pad Apply over primary dressing as directed. Secured With Compression Wrap Kerlix Roll 4.5x3.1 (in/yd) Discharge Instruction: Apply Kerlix and Coban compression as directed. Coban Self-Adherent Wrap 4x5 (in/yd) Discharge Instruction: Apply over Kerlix as directed. unna boot FIRST LAYER **** SEE INSTRUCTIONS Discharge Instruction: APPLY FIRST LAYER UNNA BOOT AT BASES OF TOES AND JUST BELOW THE KNEE TO HOLD COMPRESSION WRAP IN PLACE. Compression Stockings Add-Ons Electronic Signature(s) Signed: 09/25/2022 3:27:24 PM By: Jamie Stall RN, BSN Jamie [name], Jamie [name] [id number] PM By: Jamie Stall RN, BSN Signed: 09/25/2022 ([id number]) [id number] Page 6 of 6 Entered By: Jamie [name] on 09/25/2022 11:16:31 -------------------------------------------------------------------------------- Vitals Details Patient Name: Date of Service: Jamie [name], [name]. 09/25/2022 10:15 A M Medical Record Number: [id number] Patient Account Number: [id number] Date of Birth/Sex: Treating RN: [birth year]-[name] (82 y.o. F) Primary Care Kaianna Dolezal: Jamie [name] Other Clinician: Referring Clodagh Odenthal: Treating Slevin Gunby/Extender: Jamie [name] in Treatment: 18 Vital Signs Time Taken: 10:36 Temperature (F): 97.7 Height (in): 62 Pulse (bpm): 52 Weight (lbs): 171 Respiratory Rate (breaths/min): 18 Body  Mass Index (BMI): 31.3 Blood Pressure (mmHg): 129/73 Reference Range: 80 - 120 mg / dl Electronic Signature(s) Signed: 09/27/2022 2:31:37 PM By: Jamie [name] Entered By: Jamie [name] on 09/25/2022 10:47:16

## 2022-10-02 ENCOUNTER — Encounter (HOSPITAL_BASED_OUTPATIENT_CLINIC_OR_DEPARTMENT_OTHER): Payer: Medicare HMO | Admitting: Physician Assistant

## 2022-10-02 DIAGNOSIS — I87331 Chronic venous hypertension (idiopathic) with ulcer and inflammation of right lower extremity: Secondary | ICD-10-CM | POA: Diagnosis not present

## 2022-10-02 NOTE — Progress Notes (Addendum)
Jamie [name], Jamie [name] ([id number]) [id number] Page 1 of 9 Visit Report for 10/02/2022 Chief Complaint Document Details Patient Name: Date of Service: Jamie [name], [name]. 10/02/2022 10:15 A M Medical Record Number: [id number] Patient Account Number: [id number] Date of Birth/Sex: Treating RN: [birth year] (82 y.o. F) Primary Care Provider: [name] Other Clinician: Referring Provider: Treating Provider/Extender: [name], [name] in Treatment: 19 Information Obtained from: Patient Chief Complaint Bilateral LE Ulcers Electronic Signature(s) Signed: 10/02/2022 10:28:58 AM By: Allen Derry PA-C Entered By: Allen Derry on 10/02/2022 10:28:57 -------------------------------------------------------------------------------- HPI Details Patient Name: Date of Service: Jamie [name], [name] 10/02/2022 10:15 A M Medical Record Number: [id number] Patient Account Number: [id number] Date of Birth/Sex: Treating RN: 04/28/1940 (82 y.o. F) Primary Care Provider: [name] Other Clinician: Referring Provider: Treating Provider/Extender: [name], [name] in Treatment: 19 History of Present Illness HPI Description: ADMISSION 06/30/2020 [name] is an 82 year old woman who lives in [place]. She is here with her niece for review of wounds on the left medial lower leg and ankle. These have apparently been present for over a year and she followed with Dr. Olegario Messier at the [facility] in [place] for quite a period of time although it looks as though there was a initial consult wound from Dr. Marcha Solders on February 18 presumably there was therefore hiatus. At that point the wounds were described as being there for 3 months. She also tells me she was at the wound care center in [place] for a period of time with this. 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 She had venous studies that were negative for DVT ABIs on the right were 1.01 on the left 1.06. She has . had previous applications of puraply, compression which she does not tolerate very well. She has had several rounds of oral antibiotic therapy. She complains of unrelenting pain and she is seeing Dr. Reece Agar V of pain management apparently was on oxycodone but that did not help. She also had a skin biopsy done by Dr. Olegario Messier although we do not have that result. She does not appear to have an arterial issue. I am not completely clear what she has been putting on the wounds lately. 3/31; this patient has a particularly nasty set of wounds on the left medial ankle in the middle of what looks to be hemosiderin deposition secondary to chronic venous insufficiency. She has a lot of pain followed by pain management. Dr. Marcha Solders apparently did a biopsy of something on the left leg last fall what I would like to get this result. She has a history of MRSA treatment. I do not believe she had reflux studies but she did have DVT rule out studies. Previous ABIs have not suggested arterial insufficiency 4/7; difficult wounds on the left medial ankle probably chronic venous insufficiency. With considerable effort on behalf of our case manager we were able to finally to speak to somebody at the hospital in [place] who indicated that no biopsy of this area have been done even though the patient describes this in some detail and is even able to point out where she thinks the biopsy was done. We have been using Sorbact. The PCR culture I did showed polymicrobial identification with Pseudomonas, staph aureus, Peptostreptococcus. All of this and low titers. Resistance genes identified were MRSA, staph virulence gene and tetracycline. We are going to send this to [facility] for a topical antibiotic which is something  that we have had good success with recently in large venous ulcers with a lot of purulent  drainage. There would not be an easy oral alternative here possibly line escalated and ciprofloxacin if we need to use systemic antibiotic 4/15; difficult area on the left medial ankle. Most likely chronic venous insufficiency. I think she will probably need venous reflux study I think she had DVT rule outs but not venous reflux studies. We have not yet obtained the topical antibiotics. She has home health changing the dressing we have been using Sorbact for adherent fibrinous debris on the surface. Very difficult to remove [name], [name] ([id number]) [id number] Page 2 of 9 4/22; patient presents for 1 week follow-up. She has been using sore back under compression wraps and these are changed 3 times a week with home health. She also had Keystone antibiotics sent to her house and brought them in today. She has no complaints or issues today. 5/2; patient is here for follow-up. She has been using Sorbact under compression. Very painful wound. She has been using Keystone antibiotics. Not much improvement although the more medial part of the wound has cleaned up nicely and the larger part of the wound about 50% slough covered. Part of the issue here is that she had stays at both [facility] wound care center, [facility] wound care center and now [place]. Not sure if she has had venous reflux studies. As far as we are able to tell she did not have a biopsy. My notes state that she did not have venous reflux studies just DVT rule outs. 5/16; patient goes for venous reflux studies this afternoon. She says that wound was biopsied which sounds like punch biopsies by Dr. Lynden Ang we do not have these results. We are using Sorbact. Very difficult wound to debride 5/23; patient presents for 1 week follow-up. She reports tolerating the wraps well with sorbact underneath. She had ABIs and venous reflux studies done. She has no issues or complaints today. She denies acute signs of infection. 6/6;  I have reviewed the patient's vascular studies. Wounds are on the left medial and posterior calf. She had significant reflux in the greater saphenous vein in the in the mid thigh, distal thigh knee and the small saphenous vein in the popliteal fossa. The vein diameters do not look too impressive though. She is going to see the vascular surgeon on Wednesday. She also had venous reflux in the right common femoral vein. She did not have any evidence of a DVT or SVT I am wondering whether there is an ablation procedure that would benefit her in the greater saphenous vein on the left. . She tells [place] that home health put the dressing on too tight and she took off 1 layer. The swelling in her left leg is a little worse as a result of this. Not much change in the wound measurements so the surface of the wound looks better Her arterial studies showed an ABI on the right of 1.14 with a triphasic waveform and a great toe pressure of 0.89. On the left her ABIs were noncompressible at 1.34 but with triphasic waveforms and a TBI of 0.97. Her greater toe pressure was 122. There was no evidence of significant bilateral arterial disease 6/20 patient went to see Dr. Durwin Nora. He did not think she had significant arterial disease. In terms of her venous duplex on the right side there was no evidence of a DVT or SVT there was deep venous reflux involving the common  femoral vein no superficial vein reflux on the left side there was no DVT or SVT there was no deep vein graft reflux there was some reflux in the greater saphenous vein from the mid thigh to the knee but the vein here was not dilated. He thought these were venous wounds he prescribed a compression pump but I am not sure who we ordered it from The patient has been approved for Apligraf. Still using silver collagen this week 7/5; Apligraf #1 7/19 Apligraf #2. Decent improvement in the condition of the wound bed. Epithelialization distal 8/2 Apligraf #3. No issues  or complaints. Denies signs of infection. 8/17 Apligraf #4. No issues or concerns. Some complaints of pruritus and the rash. Our intake nurse brought up the fact that she had previously indicated possible cotton layer sensitivity we will therefore use kerlix in the bottom layer of the compression 8/30; the patient comes in with the area on her left medial leg just about healed. There is a superficial area more towards the tibia and a smaller open area distally everything else is epithelialized. I do not think she requires another Apligraf 9/13; left medial leg is healed. 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 Her edema control is good Readmisstion: 05-22-2022 upon evaluation today patient presents for initial inspection here in our clinic concerning issues that she has been having with a wound of the right lateral lower extremity. This is an area of a previous skin graft she tells me. With that being said she unfortunately had a scrape on this that occurred around 10 January. Since that time she has noted that this has just continued to get bigger and bigger in her niece who is present with her today actually states that 2 weeks ago when she saw that it was significantly smaller than what she sees currently. Obviously this is of utmost concern as we do not want this to continue to get larger when arrested and get moving in the right direction. Fortunately there does not appear to be any signs of systemic infection though locally I think we probably do have some infection present. I would obtain a culture to see what we have going on here and then we will subsequently see where things go going forward. Fortunately I think that she is in the right place at this point being at the wound center we can definitely do something to try to get this moving in the right direction. Patient does have a history of chronic venous insufficiency as well as  coronary artery disease. In the past she has done well with compression wraps on the start with a 3 layer wrap I think she is probably can end up going to a 4-layer at some point but we will see how things do over the next week. She has been using wound cleanser along with she tells me a zinc and topical but again I am not sure exactly what that was. 05-29-2022 upon evaluation today patient appears to be doing well currently in regard to her wound. This actually is showing signs of improvement I am happy in that regard unfortunately her left leg has an area on the shin that has opened. This is the region where one of the areas at least we have previously taken care of. Nonetheless this is small hoping we get it under control before things worsen significantly. 06-05-2022 upon evaluation today patient appears to be doing well currently in regard  to her wounds. The actually seem to be fairly clean she is having still quite a bit of problem with pain at this point she would like to not have debridement today for all possible. With that being said I do believe that we are making some good progress I think the [facility] is doing a decent job here. 3/6; patient has had 3/6; patient with known severe chronic venous insufficiency. She apparently had a traumatic wound at home and has a reasonably substantial wound on the right lateral lower leg and a smaller one on the left anterior lower leg as well. We have been using Hydrofera Blue and Unna boots 3/13; patient presents for follow-up. We have been using Hydrofera Blue under Coflex to the legs bilaterally. She has no issues or complaints today. 06-26-2022 upon evaluation today patient appears to be doing well currently in regard to her wound. This is measuring a little bit larger however. Fortunately I do not see any signs of infection this is on the right side. Fortunately the left side is actually completely healed which is great news. 07-03-2022 patient's  wound unfortunately is continuing to show signs of being worse. Her compression which was the Tubigrip that I thought should be able to pull up actually slipped down and she was not able to pull that back up. With that being said that means that unfortunately her wound has continued to deteriorate since I last saw her. This is definitely not the direction that we are looking for. I discussed with her that I do believe we need to go ahead and see about getting her started on antibiotics and I subsequently would also like to go ahead and see about getting things moving forward with regard to a wound culture and making a change up her dressings as well but this can be changed more frequently than just once a week. 07-10-2022 upon evaluation today patient actually appears to be doing much better. I do believe that the antibiotics have been beneficial for her. Fortunately I do not see any signs of active infection locally nor systemically which is great news. No fevers, chills, nausea, vomiting, or diarrhea. 07-17-2022 upon evaluation today patient appears to be doing well currently in regard to her wound which is actually showing signs of improvement this is slow but nonetheless prevalent that we are seeing good improvement here. Fortunately I do not see any signs of active infection locally nor systemically which is great news. 07-24-2022 upon evaluation today patient appears to be doing well currently in regard to her wound although the wrap that we had on has been slipping down and she really does not have anybody to help her with this at home health is not going to be coming out we could not get anybody that would actually be able to do the dressing changes. Fortunately I do not see any signs of active infection locally nor systemically which is great news. 08-07-22 poorly in regard to her leg compared to what it was previous. Fortunately there does not appear to be any signs of active infection locally  nor systemically which is great news. No fevers, chills, nausea, vomiting, or diarrhea. With that being said it does appear that the patient may have some cellulitis in the leg which is not good. 08-14-2022 upon evaluation today patient appears to be doing somewhat better in regard to her wounds she still is having quite a bit of pain we are still not where we want to be as  far as healing is concerned completely. Fortunately I do not see any evidence of active infection locally nor systemically which is great news and I am very pleased in that regard. Jamie [name], Jamie [name] ([id number]) [id number] Page 3 of 9 08-21-23 upon evaluation today patient appears to be doing poorly currently in regard to her wound. She has been tolerating the dressing changes without complication. Fortunately there does not appear to be any signs of active infection locally nor systemically at this time. 08-28-2022 upon evaluation today patient appears to be doing poorly in regard to her wound this was not wrapped appropriately home health did not go up high enough on the wrap. This has caused some issues and I discussed that with the patient today. I do believe that she is going to require aggressive wrapping and treatment which she is getting the [facility] topical antibiotics today they can start using that at the next wrap on Friday. In the meantime they need to make sure that the rapid and appropriately we wrote very specific orders today. 09-04-2022 upon evaluation today patient appears to be doing well currently in regard to her wound which I think is making progress is still hurting her quite significantly. Fortunately I do not see any signs of active infection locally or systemically which is great news. No fevers, chills, nausea, vomiting, or diarrhea. 09-11-2022 upon evaluation today patient's wound actually is showing signs of being a little bit smaller and looking a little bit better she still  has a lot going on here however. Fortunately I do not see any evidence of active infection Worsening locally nor systemically which is great news. With that being said worsening still continue to use the topical Keystone antibiotics. 09-18-2022 upon evaluation today patient actually showing some signs of improvement. I am actually very pleased with where we stand compared to where we have been. I think that she is making good headway here. She is still having quite a bit of pain but it seems to be lessening compared to previous. 09-25-2022 upon evaluation patient's wound actually showing signs slowly but surely of improvement. Fortunately I do not see any signs of active infection at this time systemically and locally I feel like this is dramatically improved. She is doing well with the [facility] topical antibiotics. 10-02-2022 upon evaluation today patient appears to be doing better in regard to her wound overall. I am very pleased with where things stand from a visual standpoint I do not see any signs of active infection and in general I think that we are moving in the right direction here. Electronic Signature(s) Signed: 10/02/2022 1:22:24 PM By: Allen Derry PA-C Entered By: Allen Derry on 10/02/2022 13:22:24 -------------------------------------------------------------------------------- Physical Exam Details Patient Name: Date of Service: Jamie Krill. 10/02/2022 10:15 A M Medical Record Number: [id number] Patient Account Number: [id number] Date of Birth/Sex: Treating RN: 1941-02-11 (82 y.o. F) Primary Care Provider: [name] Other Clinician: Referring Provider: Treating Provider/Extender: [name], [name] in Treatment: 58 Constitutional Well-nourished and well-hydrated in no acute distress. Respiratory normal breathing without difficulty. Psychiatric this patient is able to make decisions and demonstrates good insight into disease  process. Alert and Oriented x 3. pleasant and cooperative. Notes Upon inspection patient's wound bed actually showed signs of good granulation epithelization at this point. Fortunately I do not see any evidence of infection locally nor systemically which is great news overall I do believe that we are making headway towards complete closure. Electronic Signature(s) Signed: 10/02/2022  1:23:04 PM By: Allen Derry PA-C Entered By: Allen Derry on 10/02/2022 13:23:03 -------------------------------------------------------------------------------- Physician Orders Details Patient Name: Date of Service: Jamie [name], [place] W. 10/02/2022 10:15 A M Medical Record Number: [id number] Patient Account Number: [id number] Date of Birth/Sex: Treating RN: [birth year] (82 y.o. 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 Leonard Schwartz Wednesday room 8 Return A Other: - home health to wash right leg and foot with soap (dial soap) and water with dressing changes. Patient to have soap and towels next to her when you arrive. *****[facility] compounding topical antibiotics. FOLLOW THE PHARMACY INSTRUCTIONS ON HOW TO MIX, and apply under the hydrofera blue.****** ****PLEASE  APPLY ZINC OXIDE AROUND THE PERIWOUND DUE TO MACERATION. ****Patient to bring in the [facility] topical antibiotics to each wound care appt as well.***** *****HOME HEALTH TO PLEASE WRAP FROM BASE OF TOES TO JUST BELOW KNEE. please appy first layer of unna boot at the bast of the toes and just below the knee to help hold the compression wrap in place.**** Will fax Dr. Lucretia Field at [facility] on [address] your office notes. Anesthetic Wound #5 Right,Lateral Lower Leg (In clinic) Topical Lidocaine 5% applied to wound bed (In clinic) Topical Lidocaine 4% applied to wound bed Bathing/ Shower/ Hygiene May shower and wash wound with soap and water. - with dressing changes. Edema Control - Lymphedema / SCD / Other Right Lower Extremity Elevate legs to the level of the heart or above for 30 minutes daily and/or when sitting for 3-4 times a day throughout the day. Avoid standing for long periods of time. Exercise regularly Home Health Wound #5 Right,Lateral Lower Leg No change in wound care orders this week; continue Home Health for wound care. May utilize formulary equivalent dressing for wound treatment orders unless otherwise specified. Other Home Health Orders/Instructions: - [facility] [place], [place] fax # [phone]. Wound Treatment Wound #5 - Lower Leg Wound Laterality: Right, Lateral Cleanser: Soap and Water 3 x Per Week/30 Days Discharge Instructions: May shower and wash wound with dial antibacterial soap and water prior to dressing change. Peri-Wound Care: Zinc Oxide Ointment 30g tube 3 x Per Week/30 Days Discharge Instructions: Apply Zinc Oxide to periwound with each dressing change TO ANY MACERATED SKIN. Peri-Wound Care: Sween Lotion (Moisturizing lotion) 3 x Per Week/30 Days Discharge Instructions: Apply moisturizing lotion as directed Topical: compounding topical antibiotics 3 x Per Week/30 Days Discharge Instructions: apply directly to wound bed  under the hydrofera blue. Home Health start. MIX ACCORDING TO THE PHARMACY INSTRUCTIONS. Prim Dressing: Hydrofera Blue Ready Transfer Foam, 4x5 (in/in) (Generic) 3 x Per Week/30 Days ary Discharge Instructions: Apply over the topical antibiotics wound bed as instructed Secondary Dressing: ABD Pad, 8x10 3 x Per Week/30 Days Discharge Instructions: Apply over primary dressing as directed. Secondary Dressing: Woven Gauze Sponge, Non-Sterile 4x4 in 3 x Per Week/30 Days Discharge Instructions: Apply over primary dressing as directed. Secondary  Dressing: Zetuvit Plus 4x8 in 3 x Per Week/30 Days Discharge Instructions: Or double ABD pad Apply over primary dressing as directed. Compression Wrap: Kerlix Roll 4.5x3.1 (in/yd) 3 x Per Week/30 Days Discharge Instructions: Apply Kerlix and Coban compression as directed. Jamie [name], Jamie [name] ([id number]) [id number] Page 5 of 9 Compression Wrap: Coban Self-Adherent Wrap 4x5 (in/yd) 3 x Per Week/30 Days Discharge Instructions: Apply over Kerlix as directed. Compression Wrap: unna boot FIRST LAYER **** SEE INSTRUCTIONS 3 x Per Week/30 Days Discharge Instructions: APPLY FIRST LAYER UNNA BOOT AT BASES OF TOES AND JUST BELOW THE KNEE TO HOLD COMPRESSION WRAP IN PLACE. Electronic Signature(s) Signed: 10/02/2022 4:15:09 PM By: Allen Derry PA-C Signed: 10/07/2022 2:58:52 PM By: Pearletha Alfred Entered By: Pearletha Alfred on 10/02/2022 10:58:02 -------------------------------------------------------------------------------- Problem List Details Patient Name: Date of Service: Jamie [name], [name] 10/02/2022 10:15 A M Medical Record Number: [id number] Patient Account Number: [id number] Date of Birth/Sex: Treating RN: 1940-11-04 (82 y.o. F) Primary Care Provider: [name] Other Clinician: Referring Provider: Treating Provider/Extender: [name], [name] in Treatment: 19 Active  Problems ICD-10 Encounter Code Description Active Date MDM Diagnosis I87.331 Chronic venous hypertension (idiopathic) with ulcer and inflammation of right 05/22/2022 No Yes lower extremity L97.812 Non-pressure chronic ulcer of other part of right lower leg with fat layer 05/22/2022 No Yes exposed L97.822 Non-pressure chronic ulcer of other part of left lower leg with fat layer exposed2/21/2024 No Yes I25.10 Atherosclerotic heart disease of native coronary artery without angina pectoris 05/22/2022 No Yes Inactive Problems Resolved Problems Electronic Signature(s) Signed: 10/02/2022 10:28:44 AM By: Allen Derry PA-C Entered By: Allen Derry on 10/02/2022 10:28:43 Progress Note Details -------------------------------------------------------------------------------- Jamie [name] ([id number]) [id number] Page 6 of 9 Patient Name: Date of Service: Jamie [name], [place] 10/02/2022 10:15 A M Medical Record Number: [id number] Patient Account Number: [id number] Date of Birth/Sex: Treating RN: [birth year] (82 y.o. F) Primary Care Provider: [name] Other Clinician: Referring Provider: Treating Provider/Extender: [name], [name] in Treatment: 19 Subjective Chief Complaint Information obtained from Patient Bilateral LE Ulcers History of Present Illness (HPI) ADMISSION 06/30/2020 Mrs. Wineberg is an 82 year old woman who lives in [place]. She is here with her niece for review of wounds on the left medial lower leg and ankle. These have apparently been present for over a year and she followed with Dr. Olegario Messier at the [facility] in [place] for quite a period of time although it looks as though there was a initial consult wound from Dr. Marcha Solders on February 18 presumably there was therefore hiatus. At that point the wounds were described as being there for 3 months. She also tells me she was at the wound care center  in [place] for a period of time with this. 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 She had venous studies that were negative for DVT ABIs on the right were 1.01 on the left 1.06. She has . had previous applications of puraply, compression which she does not tolerate very well. She has had several rounds of oral antibiotic therapy. She complains of unrelenting pain and she is seeing Dr. Reece Agar V of pain management apparently was on oxycodone but that did not help. She also had a skin biopsy done by Dr. Olegario Messier although we do not have that result. She does not appear to have an arterial issue. I am not completely clear what she has been putting on the wounds lately. 3/31; this patient  has a particularly nasty set of wounds on the left medial ankle in the middle of what looks to be hemosiderin deposition secondary to chronic venous insufficiency. She has a lot of pain followed by pain management. Dr. Marcha Solders apparently did a biopsy of something on the left leg last fall what I would like to get this result. She has a history of MRSA treatment. I do not believe she had reflux studies but she did have DVT rule out studies. Previous ABIs have not suggested arterial insufficiency 4/7; difficult wounds on the left medial ankle probably chronic venous insufficiency. With considerable effort on behalf of our case manager we were able to finally to speak to somebody at the hospital in [place] who indicated that no biopsy of this area have been done even though the patient describes this in some detail and is even able to point out where she thinks the biopsy was done. We have been using Sorbact. The PCR culture I did showed polymicrobial identification with Pseudomonas, staph aureus, Peptostreptococcus. All of this and low titers. Resistance genes identified were MRSA, staph virulence gene and tetracycline. We are going to send this to [facility] for a topical  antibiotic which is something that we have had good success with recently in large venous ulcers with a lot of purulent drainage. There would not be an easy oral alternative here possibly line escalated and ciprofloxacin if we need to use systemic antibiotic 4/15; difficult area on the left medial ankle. Most likely chronic venous insufficiency. I think she will probably need venous reflux study I think she had DVT rule outs but not venous reflux studies. We have not yet obtained the topical antibiotics. She has home health changing the dressing we have been using Sorbact for adherent fibrinous debris on the surface. Very difficult to remove 4/22; patient presents for 1 week follow-up. She has been using sore back under compression wraps and these are changed 3 times a week with home health. She also had [facility] antibiotics sent to her house and brought them in today. She has no complaints or issues today. 5/2; patient is here for follow-up. She has been using Sorbact under compression. Very painful wound. She has been using [facility] antibiotics. Not much improvement although the more medial part of the wound has cleaned up nicely and the larger part of the wound about 50% slough covered. Part of the issue here is that she had stays at both [facility] wound care center, [facility] wound care center and now [place]. Not sure if she has had venous reflux studies. As far as we are able to tell she did not have a biopsy. My notes state that she did not have venous reflux studies just DVT rule outs. 5/16; patient goes for venous reflux studies this afternoon. She says that wound was biopsied which sounds like punch biopsies by Dr. Lynden Ang we do not have these results. We are using Sorbact. Very difficult wound to debride 5/23; patient presents for 1 week follow-up. She reports tolerating the wraps well with sorbact underneath. She had ABIs and venous reflux studies done. She has no issues or complaints today. She  denies acute signs of infection. 6/6; I have reviewed the patient's vascular studies. Wounds are on the left medial and posterior calf. She had significant reflux in the greater saphenous vein in the in the mid thigh, distal thigh knee and the small saphenous vein in the popliteal fossa. The vein diameters do not look too impressive though. She is  going to see the vascular surgeon on Wednesday. She also had venous reflux in the right common femoral vein. She did not have any evidence of a DVT or SVT I am wondering whether there is an ablation procedure that would benefit her in the greater saphenous vein on the left. . She tells [place] that home health put the dressing on too tight and she took off 1 layer. The swelling in her left leg is a little worse as a result of this. Not much change in the wound measurements so the surface of the wound looks better Her arterial studies showed an ABI on the right of 1.14 with a triphasic waveform and a great toe pressure of 0.89. On the left her ABIs were noncompressible at 1.34 but with triphasic waveforms and a TBI of 0.97. Her greater toe pressure was 122. There was no evidence of significant bilateral arterial disease 6/20 patient went to see Dr. Durwin Nora. He did not think she had significant arterial disease. In terms of her venous duplex on the right side there was no evidence of a DVT or SVT there was deep venous reflux involving the common femoral vein no superficial vein reflux on the left side there was no DVT or SVT there was no deep vein graft reflux there was some reflux in the greater saphenous vein from the mid thigh to the knee but the vein here was not dilated. He thought these were venous wounds he prescribed a compression pump but I am not sure who we ordered it from The patient has been approved for Apligraf. Still using silver collagen this week 7/5; Apligraf #1 7/19 Apligraf #2. Decent improvement in the condition of the wound bed.  Epithelialization distal 8/2 Apligraf #3. No issues or complaints. Denies signs of infection. 8/17 Apligraf #4. No issues or concerns. Some complaints of pruritus and the rash. Our intake nurse brought up the fact that she had previously indicated possible cotton layer sensitivity we will therefore use kerlix in the bottom layer of the compression 8/30; the patient comes in with the area on her left medial leg just about healed. There is a superficial area more towards the tibia and a smaller open area distally everything else is epithelialized. I do not think she requires another Apligraf 9/13; left medial leg is healed. 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 Her edema control is good Readmisstion: 05-22-2022 upon evaluation today patient presents for initial inspection here in our clinic concerning issues that she has been having with a wound of the right lateral lower extremity. This is an area of a previous skin graft she tells me. With that being said she unfortunately had a scrape on this that occurred around 10 January. Since that time she has noted that this has just continued to get bigger and bigger in her niece who is present with her today actually states that 2 weeks ago when she saw that it was significantly smaller than what she sees currently. Obviously this is of utmost concern as we do not want this to continue to get larger when arrested and get moving in the right direction. Fortunately there does not appear to be any signs of systemic infection though locally I think Jamie [name], Jamie [name] ([id number]) [id number] Page 7 of 9 we probably do have some infection present. I would obtain a culture to see what we have going on here and then we will subsequently see where things  go going forward. Fortunately I think that she is in the right place at this point being at the wound center we can  definitely do something to try to get this moving in the right direction. Patient does have a history of chronic venous insufficiency as well as coronary artery disease. In the past she has done well with compression wraps on the start with a 3 layer wrap I think she is probably can end up going to a 4-layer at some point but we will see how things do over the next week. She has been using wound cleanser along with she tells me a zinc and topical but again I am not sure exactly what that was. 05-29-2022 upon evaluation today patient appears to be doing well currently in regard to her wound. This actually is showing signs of improvement I am happy in that regard unfortunately her left leg has an area on the shin that has opened. This is the region where one of the areas at least we have previously taken care of. Nonetheless this is small hoping we get it under control before things worsen significantly. 06-05-2022 upon evaluation today patient appears to be doing well currently in regard to her wounds. The actually seem to be fairly clean she is having still quite a bit of problem with pain at this point she would like to not have debridement today for all possible. With that being said I do believe that we are making some good progress I think the [facility] is doing a decent job here. 3/6; patient has had 3/6; patient with known severe chronic venous insufficiency. She apparently had a traumatic wound at home and has a reasonably substantial wound on the right lateral lower leg and a smaller one on the left anterior lower leg as well. We have been using Hydrofera Blue and Unna boots 3/13; patient presents for follow-up. We have been using Hydrofera Blue under Coflex to the legs bilaterally. She has no issues or complaints today. 06-26-2022 upon evaluation today patient appears to be doing well currently in regard to her wound. This is measuring a little bit larger however. Fortunately I do not see  any signs of infection this is on the right side. Fortunately the left side is actually completely healed which is great news. 07-03-2022 patient's wound unfortunately is continuing to show signs of being worse. Her compression which was the Tubigrip that I thought should be able to pull up actually slipped down and she was not able to pull that back up. With that being said that means that unfortunately her wound has continued to deteriorate since I last saw her. This is definitely not the direction that we are looking for. I discussed with her that I do believe we need to go ahead and see about getting her started on antibiotics and I subsequently would also like to go ahead and see about getting things moving forward with regard to a wound culture and making a change up her dressings as well but this can be changed more frequently than just once a week. 07-10-2022 upon evaluation today patient actually appears to be doing much better. I do believe that the antibiotics have been beneficial for her. Fortunately I do not see any signs of active infection locally nor systemically which is great news. No fevers, chills, nausea, vomiting, or diarrhea. 07-17-2022 upon evaluation today patient appears to be doing well currently in regard to her wound which is actually showing signs of improvement  this is slow but nonetheless prevalent that we are seeing good improvement here. Fortunately I do not see any signs of active infection locally nor systemically which is great news. 07-24-2022 upon evaluation today patient appears to be doing well currently in regard to her wound although the wrap that we had on has been slipping down and she really does not have anybody to help her with this at home health is not going to be coming out we could not get anybody that would actually be able to do the dressing changes. Fortunately I do not see any signs of active infection locally nor systemically which is great  news. 08-07-22 poorly in regard to her leg compared to what it was previous. Fortunately there does not appear to be any signs of active infection locally nor systemically which is great news. No fevers, chills, nausea, vomiting, or diarrhea. With that being said it does appear that the patient may have some cellulitis in the leg which is not good. 08-14-2022 upon evaluation today patient appears to be doing somewhat better in regard to her wounds she still is having quite a bit of pain we are still not where we want to be as far as healing is concerned completely. Fortunately I do not see any evidence of active infection locally nor systemically which is great news and I am very pleased in that regard. 08-21-23 upon evaluation today patient appears to be doing poorly currently in regard to her wound. She has been tolerating the dressing changes without complication. Fortunately there does not appear to be any signs of active infection locally nor systemically at this time. 08-28-2022 upon evaluation today patient appears to be doing poorly in regard to her wound this was not wrapped appropriately home health did not go up high enough on the wrap. This has caused some issues and I discussed that with the patient today. I do believe that she is going to require aggressive wrapping and treatment which she is getting the [facility] topical antibiotics today they can start using that at the next wrap on Friday. In the meantime they need to make sure that the rapid and appropriately we wrote very specific orders today. 09-04-2022 upon evaluation today patient appears to be doing well currently in regard to her wound which I think is making progress is still hurting her quite significantly. Fortunately I do not see any signs of active infection locally or systemically which is great news. No fevers, chills, nausea, vomiting, or diarrhea. 09-11-2022 upon evaluation today patient's wound actually is showing signs of  being a little bit smaller and looking a little bit better she still has a lot going on here however. Fortunately I do not see any evidence of active infection Worsening locally nor systemically which is great news. With that being said worsening still continue to use the topical [facility] antibiotics. 09-18-2022 upon evaluation today patient actually showing some signs of improvement. I am actually very pleased with where we stand compared to where we have been. I think that she is making good headway here. She is still having quite a bit of pain but it seems to be lessening compared to previous. 09-25-2022 upon evaluation patient's wound actually showing signs slowly but surely of improvement. Fortunately I do not see any signs of active infection at this time systemically and locally I feel like this is dramatically improved. She is doing well with the [facility] topical antibiotics. 10-02-2022 upon evaluation today patient appears to be doing better  in regard to her wound overall. I am very pleased with where things stand from a visual standpoint I do not see any signs of active infection and in general I think that we are moving in the right direction here. Objective Constitutional Well-nourished and well-hydrated in no acute distress. Vitals Time Taken: 10:01 AM, Height: 62 in, Weight: 171 lbs, BMI: 31.3, Temperature: 99.3 F, Pulse: 58 bpm, Respiratory Rate: 18 breaths/min, Blood Pressure: 132/82 mmHg. Jamie [name], Jamie [name] ([id number]) [id number] Page 8 of 9 Respiratory normal breathing without difficulty. Psychiatric this patient is able to make decisions and demonstrates good insight into disease process. Alert and Oriented x 3. pleasant and cooperative. General Notes: Upon inspection patient's wound bed actually showed signs of good granulation epithelization at this point. Fortunately I do not see any evidence of infection locally nor systemically which is great  news overall I do believe that we are making headway towards complete closure. Integumentary (Hair, Skin) Wound #5 status is Open. Original cause of wound was Trauma. The date acquired was: 04/17/2022. The wound has been in treatment 19 weeks. The wound is located on the Right,Lateral Lower Leg. The wound measures 12.9cm length x 7cm width x 0.2cm depth; 70.921cm^2 area and 14.184cm^3 volume. There is Fat Layer (Subcutaneous Tissue) exposed. There is no tunneling or undermining noted. There is a large amount of serosanguineous drainage noted. The wound margin is distinct with the outline attached to the wound base. There is medium (34-66%) red, hyper - granulation within the wound bed. There is a medium (34- 66%) amount of necrotic tissue within the wound bed including Adherent Slough. The periwound skin appearance exhibited: Scarring, Dry/Scaly, Hemosiderin Staining. The periwound skin appearance did not exhibit: Callus, Crepitus, Excoriation, Induration, Rash, Maceration, Atrophie Blanche, Cyanosis, Ecchymosis, Mottled, Pallor, Rubor, Erythema. The periwound has tenderness on palpation. 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 Leonard Schwartz Wednesday room 8 Other: - home health to wash right leg and foot with soap (dial soap) and water with dressing changes. Patient to have soap and towels next to her when you arrive. *****[facility] compounding topical antibiotics. FOLLOW THE PHARMACY INSTRUCTIONS ON HOW TO MIX, and apply under the hydrofera blue.****** ****PLEASE APPLY ZINC OXIDE AROUND THE PERIWOUND DUE TO  MACERATION. ****Patient to bring in the [facility] topical antibiotics to each wound care appt as well.***** *****HOME HEALTH TO PLEASE WRAP FROM BASE OF TOES TO JUST BELOW KNEE. please appy first layer of unna boot at the bast of the toes and just below the knee to help hold the compression wrap in place.**** Will fax Dr. Lucretia Field at [facility] on [address] your office notes. Anesthetic: Wound #5 Right,Lateral Lower Leg: (In clinic) Topical Lidocaine 5% applied to wound bed (In clinic) Topical Lidocaine 4% applied to wound bed Bathing/ Shower/ Hygiene: May shower and wash wound with soap and water. - with dressing changes. Edema Control - Lymphedema / SCD / Other: Elevate legs to the level of the heart or above for 30 minutes daily and/or when sitting for 3-4 times a day throughout the day. Avoid standing for long periods of time. Exercise regularly Home Health: Wound #5 Right,Lateral Lower Leg: No change in  wound care orders this week; continue Home Health for wound care. May utilize formulary equivalent dressing for wound treatment orders unless otherwise specified. Other Home Health Orders/Instructions: - [facility] [place], [place] fax # [phone]. WOUND #5: - Lower Leg Wound Laterality: Right, Lateral Cleanser: Soap and Water 3 x Per Week/30 Days Discharge Instructions: May shower and wash wound with dial antibacterial soap and water prior to dressing change. Peri-Wound Care: Zinc Oxide Ointment 30g tube 3 x Per Week/30 Days Discharge Instructions: Apply Zinc Oxide to periwound with each dressing change TO ANY MACERATED SKIN. Peri-Wound Care: Sween Lotion (Moisturizing lotion) 3 x Per Week/30 Days Discharge Instructions: Apply moisturizing lotion as directed Topical: compounding topical antibiotics 3 x Per Week/30 Days Discharge Instructions: apply directly to wound bed under the hydrofera blue. Home Health start. MIX ACCORDING TO THE  PHARMACY INSTRUCTIONS. Prim Dressing: Hydrofera Blue Ready Transfer Foam, 4x5 (in/in) (Generic) 3 x Per Week/30 Days ary Discharge Instructions: Apply over the topical antibiotics wound bed as instructed Secondary Dressing: ABD Pad, 8x10 3 x Per Week/30 Days Discharge Instructions: Apply over primary dressing as directed. Secondary Dressing: Woven Gauze Sponge, Non-Sterile 4x4 in 3 x Per Week/30 Days Discharge Instructions: Apply over primary dressing as directed. Secondary Dressing: Zetuvit Plus 4x8 in 3 x Per Week/30 Days Discharge Instructions: Or double ABD pad Apply over primary dressing as directed. Com pression Wrap: Kerlix Roll 4.5x3.1 (in/yd) 3 x Per Week/30 Days Discharge Instructions: Apply Kerlix and Coban compression as directed. Com pression Wrap: Coban Self-Adherent Wrap 4x5 (in/yd) 3 x Per Week/30 Days Discharge Instructions: Apply over Kerlix as directed. Com pression Wrap: unna boot FIRST LAYER **** SEE INSTRUCTIONS 3 x Per Week/30 Days Discharge Instructions: APPLY FIRST LAYER UNNA BOOT AT BASES OF TOES AND JUST BELOW THE KNEE TO HOLD COMPRESSION WRAP IN PLACE. Jamie [name], Jamie [name] ([id number]) [id number] Page 9 of 9 1. I am good recommend currently that we have the patient continue with the wound care measures as before for today we put gentamicin on as she did not have her Jamie [name] in regards to transportation to get home last week she apparently lost the box with her [facility] antibiotic. 2. I am good recommend as well that the patient should continue to monitor for any signs of infection or worsening. I do believe that the compression wrap does seem to be doing a good job here and I am very pleased in that regard. Nonetheless I think that we should continue as such with the compression so that we can ensure that she is still keeping the swelling under control and moving in the right direction. 3. With regard to the [facility] topical antibiotics  I think this is something that still would be beneficial for her to have unfortunately with that being while she is in have to reorder this and subsequently I did advise that she should go ahead and get in touch with the company in order to get this reordered and she will do so. We will see patient back for reevaluation in 1 week here in the clinic. If anything worsens or changes patient will contact our office for additional recommendations. Electronic Signature(s) Signed: 10/02/2022 1:24:34 PM By: Allen Derry PA-C Entered By: Allen Derry on 10/02/2022 13:24:34 -------------------------------------------------------------------------------- SuperBill Details Patient Name: Date of Service: Jamie [name], Christean Grief. 10/02/2022 Medical Record Number: [id number] Patient Account Number: [id number] Date of Birth/Sex: Treating RN: 25-Feb-1941 (82 y.o. F) Primary Care Provider: [name] Other Clinician: Referring Provider: Treating Provider/Extender: Larina Bras  III, Kevan Rosebush, [name] in Treatment: 19 Diagnosis Coding ICD-10 Codes Code Description [phone] Chronic venous hypertension (idiopathic) with ulcer and inflammation of right lower extremity 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d L97.822 Non-pressure chronic ulcer of other part of left lower leg with fat layer exposed I25.10 Atherosclerotic heart disease of native coronary artery without angina pectoris Facility Procedures : CPT4 Code: 04540981 Description: 99213 - WOUND CARE VISIT-LEV 3 EST PT Modifier: Quantity: 1 Physician Procedures : CPT4 Code Description Modifier [id number] 99213 - WC PHYS LEVEL 3 - EST PT ICD-10 Diagnosis Description I87.331 Chronic venous hypertension (idiopathic) with ulcer and inflammation of right lower extremity 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

## 2022-10-07 NOTE — Progress Notes (Signed)
SHATISHA, MUILENBURG ([id number]) [id number] Page 1 of 8 Visit Report for 10/02/2022 Arrival Information Details Patient Name: Date of Service: Jamie [name], [name]. 10/02/2022 10:15 A M Medical Record Number: [id number] Patient Account Number: [id number] Date of Birth/Sex: Treating RN: [birth year] (82 y.o. F) Primary Care Elizah Mierzwa: [name] Other Clinician: Referring Krystl Wickware: Treating Dailin Sosnowski/Extender: [name], [name] in Treatment: 19 Visit Information History Since Last Visit All ordered tests and consults were completed: No Patient Arrived: Wheel Chair Added or deleted any medications: No Arrival Time: 10:01 Any new allergies or adverse reactions: No Accompanied By: niece Had a fall or experienced change in No Transfer Assistance: None activities of daily living that may affect Patient Identification Verified: Yes risk of falls: Secondary Verification Process Completed: Yes Signs or symptoms of abuse/neglect since last visito No Patient Requires Transmission-Based Precautions: No Hospitalized since last visit: No Patient Has Alerts: No Implantable device outside of the clinic excluding No cellular tissue based products placed in the center since last visit: Pain Present Now: No Electronic Signature(s) Signed: 10/04/2022 11:18:15 AM By: Thayer Dallas Entered By: Thayer Dallas on 10/02/2022 10:35:40 -------------------------------------------------------------------------------- Clinic Level of Care Assessment Details Patient Name: Date of Service: Jamie [name], [name]. 10/02/2022 10:15 A M Medical Record Number: [id number] Patient Account Number: [id number] Date of Birth/Sex: Treating RN: 01-15-1941 (82 y.o. Jamie [name] Primary Care Immanuel Fedak: [name] Other Clinician: Referring Dekota Shenk: Treating Arless Vineyard/Extender: [name], [name] in  Treatment: 19 Clinic Level of Care Assessment Items TOOL 4 Quantity Score X- 1 0 Use when only an EandM is performed on FOLLOW-UP visit ASSESSMENTS - Nursing Assessment / Reassessment X- 1 10 Reassessment of Co-morbidities (includes updates in patient status) X- 1 5 Reassessment of Adherence to Treatment Plan ASSESSMENTS - Wound and Skin A ssessment / Reassessment X - Simple Wound Assessment / Reassessment - one wound 1 5 []  - 0 Complex Wound Assessment / Reassessment - multiple wounds []  - 0 Dermatologic / Skin Assessment (not related to wound area) ASSESSMENTS - Focused Assessment []  - 0 Circumferential Edema Measurements - multi extremities []  - 0 Nutritional Assessment / Counseling / Intervention AJAYLAH, CASTRICONE ([id number]) [id number] Page 2 of 8 []  - 0 Lower Extremity Assessment (monofilament, tuning fork, pulses) []  - 0 Peripheral Arterial Disease Assessment (using hand held doppler) ASSESSMENTS - Ostomy and/or Continence Assessment and Care []  - 0 Incontinence Assessment and Management []  - 0 Ostomy Care Assessment and Management (repouching, etc.) PROCESS - Coordination of Care X - Simple Patient / Family Education for ongoing care 1 15 []  - 0 Complex (extensive) Patient / Family Education for ongoing care X- 1 10 Staff obtains Chiropractor, Records, T Results / Process Orders est X- 1 10 Staff telephones HHA, Nursing Homes / Clarify orders / etc []  - 0 Routine Transfer to another Facility (non-emergent condition) []  - 0 Routine Hospital Admission (non-emergent condition) []  - 0 New Admissions / Manufacturing engineer / Ordering NPWT Apligraf, etc. , []  - 0 Emergency Hospital Admission (emergent condition) X- 1 10 Simple Discharge Coordination []  - 0 Complex (extensive) Discharge Coordination PROCESS - Special Needs []  - 0 Pediatric / Minor Patient Management []  - 0 Isolation Patient Management []  - 0 Hearing / Language  / Visual special needs []  - 0 Assessment of Community assistance (transportation, D/C planning, etc.) []  - 0 Additional assistance / Altered mentation []  - 0 Support Surface(s) Assessment (bed, cushion, seat, etc.) INTERVENTIONS - Wound Cleansing / Measurement X -  Simple Wound Cleansing - one wound 1 5 []  - 0 Complex Wound Cleansing - multiple wounds X- 1 5 Wound Imaging (photographs - any number of wounds) []  - 0 Wound Tracing (instead of photographs) X- 1 5 Simple Wound Measurement - one wound []  - 0 Complex Wound Measurement - multiple wounds INTERVENTIONS - Wound Dressings X - Small Wound Dressing one or multiple wounds 1 10 []  - 0 Medium Wound Dressing one or multiple wounds []  - 0 Large Wound Dressing one or multiple wounds []  - 0 Application of Medications - topical []  - 0 Application of Medications - injection INTERVENTIONS - Miscellaneous []  - 0 External ear exam []  - 0 Specimen Collection (cultures, biopsies, blood, body fluids, etc.) []  - 0 Specimen(s) / Culture(s) sent or taken to Lab for analysis []  - 0 Patient Transfer (multiple staff / Nurse, adult / Similar devices) []  - 0 Simple Staple / Suture removal (25 or less) []  - 0 Complex Staple / Suture removal (26 or more) []  - 0 Hypo / Hyperglycemic Management (close monitor of Blood Glucose) TERREN, LOE ([id number]) [id number].pdf Page 3 of 8 []  - 0 Ankle / Brachial Index (ABI) - do not check if billed separately X- 1 5 Vital Signs Has the patient been seen at the hospital within the last three years: Yes Total Score: 95 Level Of Care: New/Established - Level 3 Electronic Signature(s) Signed: 10/02/2022 4:34:20 PM By: Karie Schwalbe RN Entered By: Karie Schwalbe on 10/02/2022 16:31:34 -------------------------------------------------------------------------------- Encounter Discharge Information Details Patient Name: Date of Service: Jamie [name], [name] 10/02/2022  10:15 A M Medical Record Number: [id number] Patient Account Number: [id number] Date of Birth/Sex: Treating RN: 02-20-1941 (82 y.o. Jamie [name] Primary Care Malessa Zartman: [name] Other Clinician: Referring Demarious Kapur: Treating Zayanna Pundt/Extender: [name], Billey Gosling in Treatment: 19 Encounter Discharge Information Items Discharge Condition: Stable Ambulatory Status: Wheelchair Discharge Destination: Home Transportation: Private Auto Accompanied By: daughter Schedule Follow-up Appointment: Yes Clinical Summary of Care: Patient Declined Electronic Signature(s) Signed: 10/02/2022 4:34:20 PM By: Karie Schwalbe RN Entered By: Karie Schwalbe on 10/02/2022 16:30:29 -------------------------------------------------------------------------------- Lower Extremity Assessment Details Patient Name: Date of Service: Jamie [name]. 10/02/2022 10:15 A M Medical Record Number: [id number] Patient Account Number: [id number] Date of Birth/Sex: Treating RN: 09-28-1940 (82 y.o. F) Primary Care Aveion Nguyen: [name] Other Clinician: Referring Kaelan Emami: Treating Japji Kok/Extender: [name], [name] in Treatment: 19 Edema Assessment Assessed: [Left: No] [Right: No] Edema: [Left: Ye] [Right: s] Calf Left: Right: Point of Measurement: From Medial Instep 35.5 cm Ankle Left: Right: Point of Measurement: From Medial Instep 25 cm Electronic Signature(s) Signed: 10/04/2022 11:18:15 AM By: Bess Harvest, [place] W11:18:15 AM By: Thayer Dallas Signed: 10/04/2022 ([id number]) [id number] Page 4 of 8 Entered By: Thayer Dallas on 10/02/2022 10:37:40 -------------------------------------------------------------------------------- Multi-Disciplinary Care Plan Details Patient Name: Date of Service: Jamie [name], [name]. 10/02/2022 10:15 A M Medical Record Number:  [id number] Patient Account Number: [id number] Date of Birth/Sex: Treating RN: 1941/02/25 (82 y.o. F) Primary Care Jullianna Gabor: [name] Other Clinician: Referring Lewin Pellow: Treating Naftula Donahue/Extender: [name] in Treatment: 19 Multidisciplinary Care Plan reviewed with physician Active Inactive Pain, Acute or Chronic Nursing Diagnoses: Pain Management - Cyclic Acute (Dressing Change Related) Pain Management - Non-cyclic Acute (Procedural) Pain, acute or chronic: actual or potential Goals: Patient will verbalize adequate pain control and receive pain control interventions during procedures as needed Date Initiated: 09/11/2022 Target Resolution Date: 10/11/2022 Goal Status: Active Patient/caregiver  will verbalize adequate pain control between visits Date Initiated: 09/11/2022 Target Resolution Date: 10/11/2022 Goal Status: Active Patient/caregiver will verbalize comfort level met Date Initiated: 09/11/2022 Target Resolution Date: 10/11/2022 Goal Status: Active Interventions: Complete pain assessment as per visit requirements Encourage patient to take pain medications as prescribed Provide education on pain management Provision of support: recognize patient pain, provide comfort and support as needed Reposition patient for comfort Treatment Activities: Administer pain control measures as ordered : 09/11/2022 Notes: Electronic Signature(s) Signed: 10/07/2022 2:58:52 PM By: Pearletha Alfred Entered By: Pearletha Alfred on 10/02/2022 10:58:15 -------------------------------------------------------------------------------- Pain Assessment Details Patient Name: Date of Service: Jamie [name]. 10/02/2022 10:15 A M Medical Record Number: [id number] Patient Account Number: [id number] Date of Birth/Sex: Treating RN: December 12, 1940 (82 y.o. F) Primary Care Chayah Mckee: [name] Other Clinician: Referring Jamaurie Bernier: Treating Dianely Krehbiel/Extender:  [name], [place] in Treatment: [address] [place] ([id number]) [id number] Page 5 of 8 Active Problems Location of Pain Severity and Description of Pain Patient Has Paino Yes Site Locations Rate the pain. Current Pain Level: 9 Worst Pain Level: 10 Least Pain Level: 0 Tolerable Pain Level: 3 Pain Management and Medication Current Pain Management: Electronic Signature(s) Signed: 10/02/2022 12:00:09 PM By: Dayton Scrape Entered By: Dayton Scrape on 10/02/2022 10:02:19 -------------------------------------------------------------------------------- Patient/Caregiver Education Details Patient Name: Date of Service: Jamie [name] 6/26/2024andnbsp10:15 A M Medical Record Number: [id number] Patient Account Number: [id number] Date of Birth/Gender: Treating RN: May 12, 1940 (82 y.o. F) Primary Care Physician: [name] Other Clinician: Referring Physician: Treating Physician/Extender: [name] in Treatment: 76 Education Assessment Education Provided To: Patient Education Topics Provided Wound/Skin Impairment: Methods: Explain/Verbal Responses: State content correctly [employer]) Signed: 10/07/2022 2:58:52 PM By: Pearletha Alfred Entered By: Pearletha Alfred on 10/02/2022 10:59:00 [name] ([id number]) [id number] Page 6 of 8 -------------------------------------------------------------------------------- Wound Assessment Details Patient Name: Date of Service: Jamie [name], [name]. 10/02/2022 10:15 A M Medical Record Number: [id number] Patient Account Number: [id number] Date of Birth/Sex: Treating RN: [birth year] (82 y.o. F) Primary Care Tyronne Blann: [name] Other Clinician: Referring Nakisha Chai: Treating Monita Swier/Extender: [name], [name] in Treatment: 19 Wound Status Wound  Number: 5 Primary Venous Leg Ulcer Etiology: Wound Location: Right, Lateral Lower Leg Wound Open Wounding Event: Trauma Status: Date Acquired: 04/17/2022 Comorbid Sleep Apnea, Hypertension, Peripheral Venous Disease, Weeks Of Treatment: 19 History: Osteoarthritis, Neuropathy Clustered Wound: Yes Photos Wound Measurements Length: (cm) Width: (cm) Depth: (cm) Clustered Quantity: Area: (cm) Volume: (cm) 12.9 % Reduction in Area: -171% 7 % Reduction in Volume: -80.7% 0.2 Epithelialization: Small (1-33%) 4 Tunneling: No 70.921 Undermining: No 14.184 Wound Description Classification: Full Thickness Without Exposed Supp Wound Margin: Distinct, outline attached Exudate Amount: Large Exudate Type: Serosanguineous Exudate Color: red, brown ort Structures Foul Odor After Cleansing: No Slough/Fibrino No Wound Bed Granulation Amount: Medium (34-66%) Exposed Structure Granulation Quality: Red, Hyper-granulation Fascia Exposed: No Necrotic Amount: Medium (34-66%) Fat Layer (Subcutaneous Tissue) Exposed: Yes Necrotic Quality: Adherent Slough Tendon Exposed: No Muscle Exposed: No Joint Exposed: No Bone Exposed: No Periwound Skin Texture Texture Color No Abnormalities Noted: No No Abnormalities Noted: No Callus: No Atrophie Blanche: No Crepitus: No Cyanosis: No Excoriation: No Ecchymosis: No Induration: No Erythema: No Rash: No Hemosiderin Staining: Yes Scarring: Yes Mottled: No Pallor: No Moisture Rubor: No No Abnormalities Noted: No Dry / Scaly: Yes Temperature / Pain Maceration: No Tenderness on Palpation: Yes Treatment Notes Wound #5 (Lower Leg) Wound Laterality: Right, Lateral [place], [employer] ([id number])  [id number] Page 7 of 8 Cleanser Soap and Water Discharge Instruction: May shower and wash wound with dial antibacterial soap and water prior to dressing change. Peri-Wound Care Zinc Oxide Ointment 30g tube Discharge  Instruction: Apply Zinc Oxide to periwound with each dressing change TO ANY MACERATED SKIN. Sween Lotion (Moisturizing lotion) Discharge Instruction: Apply moisturizing lotion as directed Topical compounding topical antibiotics Discharge Instruction: apply directly to wound bed under the hydrofera blue. Home Health start. MIX ACCORDING TO THE PHARMACY INSTRUCTIONS. Primary Dressing Hydrofera Blue Ready Transfer Foam, 4x5 (in/in) Discharge Instruction: Apply over the topical antibiotics wound bed as instructed Secondary Dressing ABD Pad, 8x10 Discharge Instruction: Apply over primary dressing as directed. Woven Gauze Sponge, Non-Sterile 4x4 in Discharge Instruction: Apply over primary dressing as directed. Zetuvit Plus 4x8 in Discharge Instruction: Or double ABD pad Apply over primary dressing as directed. Secured With Compression Wrap Kerlix Roll 4.5x3.1 (in/yd) Discharge Instruction: Apply Kerlix and Coban compression as directed. Coban Self-Adherent Wrap 4x5 (in/yd) Discharge Instruction: Apply over Kerlix as directed. unna boot FIRST LAYER **** SEE INSTRUCTIONS Discharge Instruction: APPLY FIRST LAYER UNNA BOOT AT BASES OF TOES AND JUST BELOW THE KNEE TO HOLD COMPRESSION WRAP IN PLACE. Compression Stockings Add-Ons Electronic Signature(s) Signed: 10/04/2022 11:18:15 AM By: Thayer Dallas Entered By: Thayer Dallas on 10/02/2022 10:38:02 -------------------------------------------------------------------------------- Vitals Details Patient Name: Date of Service: Jamie Brink, [name] 10/02/2022 10:15 A M Medical Record Number: [id number] Patient Account Number: [id number] Date of Birth/Sex: Treating RN: May 13, 1940 (82 y.o. F) Primary Care Zeinab Rodwell: [name] Other Clinician: Referring Felicity Penix: Treating Taja Pentland/Extender: [name], [name] in Treatment: 19 Vital Signs Time Taken: 10:01 Temperature (F): 99.3 Height (in):  62 Pulse (bpm): 58 Weight (lbs): 171 Respiratory Rate (breaths/min): 18 Body Mass Index (BMI): 31.3 Blood Pressure (mmHg): 132/82 Reference Range: 80 - 120 mg / dl Electronic Signature(s) Signed: 10/02/2022 12:00:09 PM By: Darrick Meigs, [place] W12:00:09 PM By: Dayton Scrape Signed: 10/02/2022 ([id number]) [id number] Page 8 of 8 Entered By: Dayton Scrape on 10/02/2022 10:02:03

## 2022-10-09 ENCOUNTER — Encounter (HOSPITAL_BASED_OUTPATIENT_CLINIC_OR_DEPARTMENT_OTHER): Payer: Medicare HMO | Attending: Physician Assistant | Admitting: Physician Assistant

## 2022-10-09 DIAGNOSIS — L97812 Non-pressure chronic ulcer of other part of right lower leg with fat layer exposed: Secondary | ICD-10-CM | POA: Diagnosis not present

## 2022-10-09 DIAGNOSIS — I2510 Atherosclerotic heart disease of native coronary artery without angina pectoris: Secondary | ICD-10-CM | POA: Diagnosis not present

## 2022-10-09 DIAGNOSIS — I87331 Chronic venous hypertension (idiopathic) with ulcer and inflammation of right lower extremity: Secondary | ICD-10-CM | POA: Insufficient documentation

## 2022-10-09 DIAGNOSIS — L97822 Non-pressure chronic ulcer of other part of left lower leg with fat layer exposed: Secondary | ICD-10-CM | POA: Insufficient documentation

## 2022-10-09 NOTE — Progress Notes (Signed)
Jamie, [name] ([id number]) [id number] Page 1 of 4 Visit Report for 10/09/2022 Chief Complaint Document Details Patient Name: Date of Service: Jamie [name], [name]. 10/09/2022 10:15 A M Medical Record Number: [id number] Patient Account Number: [id number] Date of Birth/Sex: Treating RN: [birth year] (82 y.o. F) Primary Care Provider: [name] Other Clinician: Referring Provider: Treating Provider/Extender: [name], [name] in Treatment: 20 Information Obtained from: Patient Chief Complaint Bilateral LE Ulcers Electronic Signature(s) Signed: 10/09/2022 11:10:16 AM By: Allen Derry PA-C Entered By: Allen Derry on 10/09/2022 11:10:16 -------------------------------------------------------------------------------- Debridement Details Patient Name: Date of Service: Jamie [name], [name] 10/09/2022 10:15 A M Medical Record Number: [id number] Patient Account Number: [id number] Date of Birth/Sex: Treating RN: [birth year] (82 y.o. Jamie [name] Primary Care Provider: [name] Other Clinician: Referring Provider: Treating Provider/Extender: [name], [name] in Treatment: 20 Debridement Performed for Assessment: Wound #5 Right,Lateral Lower Leg Performed By: Physician Lenda Kelp, PA Debridement Type: Debridement Severity of Tissue Pre Debridement: Fat layer exposed Level of Consciousness (Pre-procedure): Awake and Alert Pre-procedure Verification/Time Out Yes - 11:11 Taken: Start Time: 11:11 Pain Control: Lidocaine 4% T opical Solution Percent of Wound Bed Debrided: 100% T Area Debrided (cm): otal 69.87 Tissue and other material debrided: Non-Viable, Slough, Slough Level: Non-Viable Tissue Debridement Description: Selective/Open Wound Instrument: Curette Bleeding: Minimum Hemostasis Achieved: Pressure End Time: 11:12 Procedural Pain: 0 Post Procedural  Pain: 0 Response to Treatment: Procedure was tolerated well Level of Consciousness (Post- Awake and Alert procedure): Post Debridement Measurements of Total Wound Length: (cm) 12.9 Width: (cm) 6.9 Depth: (cm) 0.2 Volume: (cm) 13.982 Character of Wound/Ulcer Post Debridement: Improved DYSTANY, SHUEMAKE ([id number]) [id number] Page 2 of 4 Severity of Tissue Post Debridement: Fat layer exposed Post Procedure Diagnosis Same as Pre-procedure Notes Scribed for Allen Derry PA-C, by [employer]) Unsigned Entered By: Karie Schwalbe on 10/09/2022 11:15:15 -------------------------------------------------------------------------------- Physician Orders Details Patient Name: Date of Service: Jamie [name]. 10/09/2022 10:15 A M Medical Record Number: [id number] Patient Account Number: [id number] Date of Birth/Sex: Treating RN: 09-Nov-1940 (82 y.o. 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 Leonard Schwartz Wednesday Room 8 Return A Other: - home health to wash right leg and foot with soap (dial soap) and water with dressing changes. Patient to have soap and towels next to her when Home Health arrives at the house. *****[facility] compounding topical antibiotics. FOLLOW THE PHARMACY INSTRUCTIONS ON HOW  TO MIX, and apply under the hydrofera blue.****** ****PLEASE APPLY ZINC OXIDE AROUND THE PERIWOUND DUE TO MACERATION. ****Patient to bring in the [facility] topical antibiotics to each wound care appt as well.***** *****HOME HEALTH TO PLEASE WRAP FROM BASE OF TOES TO JUST BELOW KNEE. please appy first layer of unna boot at the bast of the toes and just below the knee to help hold the compression wrap in place.**** Will fax Dr. Lucretia Field at [facility] on [address] your office notes. Anesthetic Wound #5 Right,Lateral Lower Leg (In  clinic) Topical Lidocaine 5% applied to wound bed (In clinic) Topical Lidocaine 4% applied to wound bed Bathing/ Shower/ Hygiene May shower and wash wound with soap and water. - with dressing changes. Edema Control - Lymphedema / SCD / Other Right Lower Extremity Elevate legs to the level of the heart or above for 30 minutes daily and/or when sitting for 3-4 times a day throughout the day. Avoid standing for long periods of time. Exercise regularly Home Health Wound #5 Right,Lateral Lower Leg No change in wound care orders this week; continue Home Health for wound care. May utilize formulary equivalent dressing for wound treatment orders unless otherwise specified. Other Home Health Orders/Instructions: - [facility] in [place], [place] fax # [phone]. Jamie, [name] ([id number]) [id number] Page 3 of 4 Wound Treatment Wound #5 - Lower Leg Wound Laterality: Right, Lateral Cleanser: Soap and Water 3 x Per Week/30 Days Discharge Instructions: May shower and wash wound with dial antibacterial soap and water prior to dressing change. Peri-Wound Care: Zinc Oxide Ointment 30g tube 3 x Per Week/30 Days Discharge Instructions: Apply Zinc Oxide to periwound with each dressing change TO ANY MACERATED SKIN. Peri-Wound Care: Sween Lotion (Moisturizing lotion) 3 x Per Week/30 Days Discharge Instructions: Apply  moisturizing lotion as directed Topical: compounding topical antibiotics 3 x Per Week/30 Days Discharge Instructions: apply directly to wound bed under the hydrofera blue. Home Health start. MIX ACCORDING TO THE PHARMACY INSTRUCTIONS. Prim Dressing: Hydrofera Blue Ready Transfer Foam, 4x5 (in/in) (Generic) 3 x Per Week/30 Days ary Discharge Instructions: Apply over the topical antibiotics wound bed as instructed Secondary Dressing: ABD Pad, 8x10 3 x Per Week/30 Days Discharge Instructions: Apply over primary dressing as directed. Secondary Dressing: Woven Gauze Sponge, Non-Sterile 4x4 in 3 x Per Week/30 Days Discharge Instructions: Apply over primary dressing as directed. Secondary Dressing: Zetuvit Plus 4x8 in 3 x Per Week/30 Days Discharge Instructions: Or double ABD pad Apply over primary dressing as directed. Compression Wrap: Kerlix Roll 4.5x3.1 (in/yd) 3 x Per Week/30 Days Discharge Instructions: Apply Kerlix and Coban compression as directed. Compression Wrap: Coban Self-Adherent Wrap 4x5 (in/yd) 3 x Per Week/30 Days Discharge Instructions: Apply over Kerlix as directed. Compression Wrap: unna boot FIRST LAYER **** SEE INSTRUCTIONS 3 x Per Week/30 Days Discharge Instructions: APPLY FIRST LAYER UNNA BOOT AT BASES OF TOES AND JUST BELOW THE KNEE TO HOLD COMPRESSION WRAP IN PLACE. Electronic Signature(s) Unsigned Entered By: Karie Schwalbe on 10/09/2022 11:18:11 -------------------------------------------------------------------------------- Problem List Details Patient Name: Date of Service: Jamie [name], [name]. 10/09/2022 10:15 A M Medical Record Number: [id number] Patient Account Number: [id number] Date of Birth/Sex: Treating RN: 1941/03/05 (82 y.o. F) Primary Care Provider: [name] Other Clinician: Referring Provider: Treating Provider/Extender: [name], [name] in Treatment: 20 Active Problems ICD-10 Encounter Code  Description Active Date MDM Diagnosis I87.331 Chronic venous hypertension (idiopathic) with ulcer and inflammation of right 05/22/2022 No Yes lower extremity L97.812 Non-pressure chronic ulcer of other part of right lower leg with fat layer 05/22/2022 No Yes exposed TIVONA, [place] ([id number]) [id number] Page 4 of 4 [phone] Non-pressure chronic ulcer of other part of left lower leg with fat layer exposed2/21/2024 No Yes I25.10 Atherosclerotic heart disease of native coronary artery without angina pectoris 05/22/2022 No Yes Inactive Problems Resolved Problems Electronic Signature(s) Signed: 10/09/2022 11:10:08 AM By: Allen Derry PA-C Entered By: Allen Derry on 10/09/2022 11:10:08

## 2022-10-10 NOTE — Progress Notes (Signed)
IVORY, MAHOWALD ([id number]) [id number] Page 1 of 6 Visit Report for 10/09/2022 Arrival Information Details Patient Name: Date of Service: Jamie [name], [name]. 10/09/2022 10:15 A M Medical Record Number: [id number] Patient Account Number: [id number] Date of Birth/Sex: Treating RN: 05-23-1940 (82 y.o. F) Primary Care Teia Freitas: [name] Other Clinician: Referring Othon Guardia: Treating Shatarra Wehling/Extender: [name], [name] in Treatment: 20 Visit Information History Since Last Visit Added or deleted any medications: No Patient Arrived: Walker Any new allergies or adverse reactions: No Arrival Time: 10:20 Had a fall or experienced change in Yes Accompanied By: niece activities of daily living that may affect Transfer Assistance: None risk of falls: Patient Identification Verified: Yes Signs or symptoms of abuse/neglect since last visito No Secondary Verification Process Completed: Yes Hospitalized since last visit: No Patient Requires Transmission-Based Precautions: No Implantable device outside of the clinic excluding No Patient Has Alerts: No cellular tissue based products placed in the center since last visit: Has Dressing in Place as Prescribed: Yes Has Compression in Place as Prescribed: Yes Pain Present Now: Yes Electronic Signature(s) Signed: 10/09/2022 4:52:44 PM By: Thayer Dallas Entered By: Thayer Dallas on 10/09/2022 10:28:13 -------------------------------------------------------------------------------- Encounter Discharge Information Details Patient Name: Date of Service: Jamie [name], [name] 10/09/2022 10:15 A M Medical Record Number: [id number] Patient Account Number: [id number] Date of Birth/Sex: Treating RN: 10-04-1940 (82 y.o. [name] Primary Care Khalaya Mcgurn: [name] Other Clinician: Referring Jerrell Hart: Treating Keirstan Iannello/Extender: [name],  [name] in Treatment: 20 Encounter Discharge Information Items Post Procedure Vitals Discharge Condition: Stable Temperature (F): 98.3 Ambulatory Status: Wheelchair Pulse (bpm): 51 Discharge Destination: Home Respiratory Rate (breaths/min): 18 Transportation: Private Auto Blood Pressure (mmHg): 126/74 Accompanied By: Daughter Schedule Follow-up Appointment: Yes Clinical Summary of Care: Patient Declined Electronic Signature(s) Signed: 10/09/2022 5:25:37 PM By: Karie Schwalbe RN Entered By: Karie Schwalbe on 10/09/2022 17:24:18 Jamie [name] ([id number]) [id number] Page 2 of 6 -------------------------------------------------------------------------------- Lower Extremity Assessment Details Patient Name: Date of Service: Jamie [name], [name]. 10/09/2022 10:15 A M Medical Record Number: [id number] Patient Account Number: [id number] Date of Birth/Sex: Treating RN: May 12, 1940 (82 y.o. [name] Primary Care Jadarion Halbig: [name] Other Clinician: Referring Cheyne Bungert: Treating Jonnathan Birman/Extender: [name], [name] in Treatment: 20 Edema Assessment Assessed: [Left: No] [Right: No] Edema: [Left: Ye] [Right: s] Calf Left: Right: Point of Measurement: From Medial Instep 35.1 cm Ankle Left: Right: Point of Measurement: From Medial Instep 25.1 cm Vascular Assessment Pulses: Dorsalis Pedis Palpable: [Right:Yes] Electronic Signature(s) Signed: 10/09/2022 5:25:37 PM By: Karie Schwalbe RN Entered By: Karie Schwalbe on 10/09/2022 10:49:28 -------------------------------------------------------------------------------- Multi-Disciplinary Care Plan Details Patient Name: Date of Service: Jamie [name], [name] 10/09/2022 10:15 A M Medical Record Number: [id number] Patient Account Number: [id number] Date of Birth/Sex: Treating RN: 07/07/1940 (82 y.o. [name] Primary Care Eljay Lave:  [name] Other Clinician: Referring Jailynne Opperman: Treating Colm Lyford/Extender: [name] in Treatment: 20 Multidisciplinary Care Plan reviewed with physician Active Inactive Pain, Acute or Chronic Nursing Diagnoses: Pain Management - Cyclic Acute (Dressing Change Related) Pain Management - Non-cyclic Acute (Procedural) Pain, acute or chronic: actual or potential Goals: Patient will verbalize adequate pain control and receive pain control interventions during procedures as needed Date Initiated: 09/11/2022 Target Resolution Date: 02/06/2023 Goal Status: Active Patient/caregiver will verbalize adequate pain control between visits Date Initiated: 09/11/2022 Target Resolution Date: 02/06/2023 Jamie [name], Jamie [name] ([id number]) [id number] Page 3 of 6 Goal Status: Active Patient/caregiver will verbalize comfort level  met Date Initiated: 09/11/2022 Target Resolution Date: 02/06/2023 Goal Status: Active Interventions: Complete pain assessment as per visit requirements Encourage patient to take pain medications as prescribed Provide education on pain management Provision of support: recognize patient pain, provide comfort and support as needed Reposition patient for comfort Treatment Activities: Administer pain control measures as ordered : 09/11/2022 Notes: Electronic Signature(s) Signed: 10/09/2022 5:25:37 PM By: Karie Schwalbe RN Entered By: Karie Schwalbe on 10/09/2022 17:22:40 -------------------------------------------------------------------------------- Pain Assessment Details Patient Name: Date of Service: Jamie Mart W. 10/09/2022 10:15 A M Medical Record Number: [id number] Patient Account Number: [id number] Date of Birth/Sex: Treating RN: 1941-02-25 (82 y.o. F) Primary Care Brynlyn Dade: [name] Other Clinician: Referring Megean Fabio: Treating Kailo Kosik/Extender: [name], [name] in Treatment: 20 Active Problems Location of Pain Severity and Description of Pain Patient Has Paino Yes Site Locations Pain Location: Pain in Ulcers Duration of the Pain. Constant / Intermittento Constant Rate the pain. Current Pain Level: 10 Character of Pain Describe the Pain: Stabbing Pain Management and Medication Current Pain Management: Electronic Signature(s) Signed: 10/09/2022 4:52:44 PM By: Thayer Dallas Entered By: Thayer Dallas on 10/09/2022 10:30:50 Jamie [name] ([id number]) [id number] Page 4 of 6 -------------------------------------------------------------------------------- Patient/Caregiver Education Details Patient Name: Date of Service: Jamie [name], [place] 7/3/2024andnbsp10:15 A M Medical Record Number: [id number] Patient Account Number: [id number] Date of Birth/Gender: Treating RN: 1940-10-03 (82 y.o. [name] Primary Care Physician: [name] Other Clinician: Referring Physician: Treating Physician/Extender: [name], Billey Gosling in Treatment: 20 Education Assessment Education Provided To: Patient Education Topics Provided Wound/Skin Impairment: Methods: Explain/Verbal Responses: Return demonstration correctly Electronic Signature(s) Signed: 10/09/2022 5:25:37 PM By: Karie Schwalbe RN Entered By: Karie Schwalbe on 10/09/2022 17:23:27 -------------------------------------------------------------------------------- Wound Assessment Details Patient Name: Date of Service: Jamie Krill. 10/09/2022 10:15 A M Medical Record Number: [id number] Patient Account Number: [id number] Date of Birth/Sex: Treating RN: January 29, 1941 (82 y.o. [name] Primary Care Eliya Geiman: [name] Other Clinician: Referring Lavonda Thal: Treating Deronte Solis/Extender: [name], [name] in Treatment:  20 Wound Status Wound Number: 5 Primary Venous Leg Ulcer Etiology: Wound Location: Right, Lateral Lower Leg Wound Open Wounding Event: Trauma Status: Date Acquired: 04/17/2022 Comorbid Sleep Apnea, Hypertension, Peripheral Venous Disease, Weeks Of Treatment: 20 History: Osteoarthritis, Neuropathy Clustered Wound: Yes Photos Wound Measurements Length: (cm) Width: (cm) YAMILED, PICONE ([id number]) Depth: (cm) Clustered Quantity: Area: (cm) Volume: (cm) 12.9 % Reduction in Area: -167.1% 6.9 % Reduction in Volume: -78.1% [id number] Page 5 of 6 0.2 Epithelialization: Small (1-33%) 4 Tunneling: No 69.908 Undermining: No 13.982 Wound Description Classification: Full Thickness Without Exposed Supp Wound Margin: Distinct, outline attached Exudate Amount: Large Exudate Type: Serosanguineous Exudate Color: red, brown ort Structures Foul Odor After Cleansing: No Slough/Fibrino No Wound Bed Granulation Amount: Medium (34-66%) Exposed Structure Granulation Quality: Red, Hyper-granulation Fascia Exposed: No Necrotic Amount: Medium (34-66%) Fat Layer (Subcutaneous Tissue) Exposed: Yes Necrotic Quality: Eschar, Adherent Slough Tendon Exposed: No Muscle Exposed: No Joint Exposed: No Bone Exposed: No Periwound Skin Texture Texture Color No Abnormalities Noted: No No Abnormalities Noted: No Callus: No Atrophie Blanche: No Crepitus: No Cyanosis: No Excoriation: No Ecchymosis: No Induration: No Erythema: No Rash: No Hemosiderin Staining: Yes Scarring: Yes Mottled: No Pallor: No Moisture Rubor: No No Abnormalities Noted: No Dry / Scaly: Yes Temperature / Pain Maceration: No Tenderness on Palpation: Yes Treatment Notes Wound #5 (Lower Leg) Wound Laterality: Right, Lateral Cleanser Soap and Water Discharge Instruction: May shower and wash wound  with dial antibacterial soap and water prior to dressing change. Peri-Wound Care Zinc Oxide  Ointment 30g tube Discharge Instruction: Apply Zinc Oxide to periwound with each dressing change TO ANY MACERATED SKIN. Sween Lotion (Moisturizing lotion) Discharge Instruction: Apply moisturizing lotion as directed Topical compounding topical antibiotics Discharge Instruction: apply directly to wound bed under the hydrofera blue. Home Health start. MIX ACCORDING TO THE PHARMACY INSTRUCTIONS. Primary Dressing Hydrofera Blue Ready Transfer Foam, 4x5 (in/in) Discharge Instruction: Apply over the topical antibiotics wound bed as instructed Secondary Dressing ABD Pad, 8x10 Discharge Instruction: Apply over primary dressing as directed. Woven Gauze Sponge, Non-Sterile 4x4 in Discharge Instruction: Apply over primary dressing as directed. Zetuvit Plus 4x8 in Discharge Instruction: Or double ABD pad Apply over primary dressing as directed. Secured With Compression Wrap Kerlix Roll 4.5x3.1 (in/yd) Discharge Instruction: Apply Kerlix and Coban compression as directed. Coban Self-Adherent Wrap 4x5 (in/yd) Discharge Instruction: Apply over Kerlix as directed. Jamie [name], Jamie [name] ([id number]) [id number] Page 6 of 6 unna boot FIRST LAYER **** SEE INSTRUCTIONS Discharge Instruction: APPLY FIRST LAYER UNNA BOOT AT BASES OF TOES AND JUST BELOW THE KNEE TO HOLD COMPRESSION WRAP IN PLACE. Compression Stockings Add-Ons Electronic Signature(s) Signed: 10/09/2022 5:25:37 PM By: Karie Schwalbe RN Entered By: Karie Schwalbe on 10/09/2022 11:13:20 -------------------------------------------------------------------------------- Vitals Details Patient Name: Date of Service: Jamie Brink, [place] Jamie W. 10/09/2022 10:15 A M Medical Record Number: [id number] Patient Account Number: [id number] Date of Birth/Sex: Treating RN: 12/19/1940 (82 y.o. F) Primary Care Cleatus Gabriel: [name] Other Clinician: Referring Saraphina Lauderbaugh: Treating Crit Obremski/Extender: [name], [name] in Treatment: 20 Vital Signs Time Taken: 10:28 Temperature (F): 98.3 Height (in): 62 Pulse (bpm): 51 Weight (lbs): 171 Respiratory Rate (breaths/min): 18 Body Mass Index (BMI): 31.3 Blood Pressure (mmHg): 126/74 Reference Range: 80 - 120 mg / dl Electronic Signature(s) Signed: 10/09/2022 4:52:44 PM By: Thayer Dallas Entered By: Thayer Dallas on 10/09/2022 10:30:29

## 2022-10-16 ENCOUNTER — Encounter (HOSPITAL_BASED_OUTPATIENT_CLINIC_OR_DEPARTMENT_OTHER): Payer: Medicare HMO | Admitting: Physician Assistant

## 2022-10-16 DIAGNOSIS — I87331 Chronic venous hypertension (idiopathic) with ulcer and inflammation of right lower extremity: Secondary | ICD-10-CM | POA: Diagnosis not present

## 2022-10-16 NOTE — Progress Notes (Addendum)
Jamie [name], Jamie [name] ([id number]) [id number] Page 1 of 10 Visit Report for 10/16/2022 Chief Complaint Document Details Patient Name: Date of Service: Jamie [name], [name]. 10/16/2022 10:15 A M Medical Record Number: [id number] Patient Account Number: [id number] Date of Birth/Sex: Treating RN: 08/10/1940 (82 y.o. F) Primary Care Provider: Delorise [name] Other Clinician: Referring Provider: Treating Provider/Extender: Jamie [name], Jamie [name] in Treatment: 21 Information Obtained from: Patient Chief Complaint Bilateral LE Ulcers Electronic Signature(s) Signed: 10/16/2022 10:28:21 AM By: Jamie Derry PA-C Entered By: Jamie [name] on 10/16/2022 10:28:21 -------------------------------------------------------------------------------- HPI Details Patient Name: Date of Service: Jamie [name], [name] 10/16/2022 10:15 A M Medical Record Number: [id number] Patient Account Number: [id number] Date of Birth/Sex: Treating RN: 11-23-1940 (82 y.o. F) Primary Care Provider: Delorise [name] Other Clinician: Referring Provider: Treating Provider/Extender: Jamie [name], Jamie [name] in Treatment: 21 History of Present Illness HPI Description: ADMISSION 06/30/2020 Jamie [name] is an 82 year old woman who lives in [place]. She is here with her niece for review of wounds on the left medial lower leg and ankle. These have apparently been present for over a year and she followed with Jamie [name] at the [facility] in [place] for quite a period of time although it looks as though there was a initial consult wound from Jamie [name] on February 18 presumably there was therefore hiatus. At that point the wounds were described as being there for 3 months. She also tells me she was at the wound care center in [place] for a period of time with this. 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 She had venous studies that were negative for DVT ABIs on the right were 1.01 on the left 1.06. She has . had previous applications of puraply, compression which she does not tolerate very well. She has had several rounds of oral antibiotic therapy. She complains of unrelenting pain and she is seeing Jamie [name] of pain management apparently was on oxycodone but that did not help. She also had a skin biopsy done by Jamie [name] although we do not have that result. She does not appear to have an arterial issue. I am not completely clear what she has been putting on the wounds lately. 3/31; this patient has a particularly nasty set of wounds on the left medial ankle in the middle of what looks to be hemosiderin deposition secondary to chronic venous insufficiency. She has a lot of pain followed by pain management. Jamie [name] apparently did a biopsy of something on the left leg last fall what I would like to get this result. She has a history of MRSA treatment. I do not believe she had reflux studies but she did have DVT rule out studies. Previous ABIs have not suggested arterial insufficiency 4/7; difficult wounds on the left medial ankle probably chronic venous insufficiency. With considerable effort on behalf of our case manager we were able to finally to speak to somebody at the hospital in [place] who indicated that no biopsy of this area have been done even though the patient describes this in some detail and is even able to point out where she thinks the biopsy was done. We have been using Sorbact. The PCR culture I did showed polymicrobial identification with Pseudomonas, staph aureus, Peptostreptococcus. All of this and low titers. Resistance genes identified were MRSA, staph virulence gene and tetracycline. We are going to send this to [facility] for a topical antibiotic which is something  that we have had good success with recently in large venous ulcers with a lot of purulent  drainage. There would not be an easy oral alternative here possibly line escalated and ciprofloxacin if we need to use systemic antibiotic 4/15; difficult area on the left medial ankle. Most likely chronic venous insufficiency. I think she will probably need venous reflux study I think she had DVT rule outs but not venous reflux studies. We have not yet obtained the topical antibiotics. She has home health changing the dressing we have been using Sorbact for adherent fibrinous debris on the surface. Very difficult to remove Jamie [name], Jamie [name] ([id number]) [id number] Page 2 of 10 4/22; patient presents for 1 week follow-up. She has been using sore back under compression wraps and these are changed 3 times a week with home health. She also had [facility] antibiotics sent to her house and brought them in today. She has no complaints or issues today. 5/2; patient is here for follow-up. She has been using Sorbact under compression. Very painful wound. She has been using [facility] antibiotics. Not much improvement although the more medial part of the wound has cleaned up nicely and the larger part of the wound about 50% slough covered. Part of the issue here is that she had stays at both [facility] wound care center, [facility] wound care center and now [place]. Not sure if she has had venous reflux studies. As far as we are able to tell she did not have a biopsy. My notes state that she did not have venous reflux studies just DVT rule outs. 5/16; patient goes for venous reflux studies this afternoon. She says that wound was biopsied which sounds like punch biopsies by Dr. Lynden [name] we do not have these results. We are using Sorbact. Very difficult wound to debride 5/23; patient presents for 1 week follow-up. She reports tolerating the wraps well with sorbact underneath. She had ABIs and venous reflux studies done. She has no issues or complaints today. She denies acute signs of  infection. 6/6; I have reviewed the patient's vascular studies. Wounds are on the left medial and posterior calf. She had significant reflux in the greater saphenous vein in the in the mid thigh, distal thigh knee and the small saphenous vein in the popliteal fossa. The vein diameters do not look too impressive though. She is going to see the vascular surgeon on Wednesday. She also had venous reflux in the right common femoral vein. She did not have any evidence of a DVT or SVT I am wondering whether there is an ablation procedure that would benefit her in the greater saphenous vein on the left. . She tells [place] that home health put the dressing on too tight and she took off 1 layer. The swelling in her left leg is a little worse as a result of this. Not much change in the wound measurements so the surface of the wound looks better Her arterial studies showed an ABI on the right of 1.14 with a triphasic waveform and a great toe pressure of 0.89. On the left her ABIs were noncompressible at 1.34 but with triphasic waveforms and a TBI of 0.97. Her greater toe pressure was 122. There was no evidence of significant bilateral arterial disease 6/20 patient went to see Dr. Durwin Nora. He did not think she had significant arterial disease. In terms of her venous duplex on the right side there was no evidence of a DVT or SVT there was deep venous reflux involving the common  femoral vein no superficial vein reflux on the left side there was no DVT or SVT there was no deep vein graft reflux there was some reflux in the greater saphenous vein from the mid thigh to the knee but the vein here was not dilated. He thought these were venous wounds he prescribed a compression pump but I am not sure who we ordered it from The patient has been approved for Apligraf. Still using silver collagen this week 7/5; Apligraf #1 7/19 Apligraf #2. Decent improvement in the condition of the wound bed. Epithelialization distal 8/2  Apligraf #3. No issues or complaints. Denies signs of infection. 8/17 Apligraf #4. No issues or concerns. Some complaints of pruritus and the rash. Our intake nurse brought up the fact that she had previously indicated possible cotton layer sensitivity we will therefore use kerlix in the bottom layer of the compression 8/30; the patient comes in with the area on her left medial leg just about healed. There is a superficial area more towards the tibia and a smaller open area distally everything else is epithelialized. I do not think she requires another Apligraf 9/13; left medial leg is healed. 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 Her edema control is good Readmisstion: 05-22-2022 upon evaluation today patient presents for initial inspection here in our clinic concerning issues that she has been having with a wound of the right lateral lower extremity. This is an area of a previous skin graft she tells me. With that being said she unfortunately had a scrape on this that occurred around 10 January. Since that time she has noted that this has just continued to get bigger and bigger in her niece who is present with her today actually states that 2 [name] ago when she saw that it was significantly smaller than what she sees currently. Obviously this is of utmost concern as we do not want this to continue to get larger when arrested and get moving in the right direction. Fortunately there does not appear to be any signs of systemic infection though locally I think we probably do have some infection present. I would obtain a culture to see what we have going on here and then we will subsequently see where things go going forward. Fortunately I think that she is in the right place at this point being at the wound center we can definitely do something to try to get this moving in the right direction. Patient does have a history of chronic venous  insufficiency as well as coronary artery disease. In the past she has done well with compression wraps on the start with a 3 layer wrap I think she is probably can end up going to a 4-layer at some point but we will see how things do over the next week. She has been using wound cleanser along with she tells me a zinc and topical but again I am not sure exactly what that was. 05-29-2022 upon evaluation today patient appears to be doing well currently in regard to her wound. This actually is showing signs of improvement I am happy in that regard unfortunately her left leg has an area on the shin that has opened. This is the region where one of the areas at least we have previously taken care of. Nonetheless this is small hoping we get it under control before things worsen significantly. 06-05-2022 upon evaluation today patient appears to be doing well currently in regard  to her wounds. The actually seem to be fairly clean she is having still quite a bit of problem with pain at this point she would like to not have debridement today for all possible. With that being said I do believe that we are making some good progress I think the [facility] is doing a decent job here. 3/6; patient has had 3/6; patient with known severe chronic venous insufficiency. She apparently had a traumatic wound at home and has a reasonably substantial wound on the right lateral lower leg and a smaller one on the left anterior lower leg as well. We have been using Hydrofera Blue and Unna boots 3/13; patient presents for follow-up. We have been using Hydrofera Blue under Coflex to the legs bilaterally. She has no issues or complaints today. 06-26-2022 upon evaluation today patient appears to be doing well currently in regard to her wound. This is measuring a little bit larger however. Fortunately I do not see any signs of infection this is on the right side. Fortunately the left side is actually completely healed which is great  news. 07-03-2022 patient's wound unfortunately is continuing to show signs of being worse. Her compression which was the Tubigrip that I thought should be able to pull up actually slipped down and she was not able to pull that back up. With that being said that means that unfortunately her wound has continued to deteriorate since I last saw her. This is definitely not the direction that we are looking for. I discussed with her that I do believe we need to go ahead and see about getting her started on antibiotics and I subsequently would also like to go ahead and see about getting things moving forward with regard to a wound culture and making a change up her dressings as well but this can be changed more frequently than just once a week. 07-10-2022 upon evaluation today patient actually appears to be doing much better. I do believe that the antibiotics have been beneficial for her. Fortunately I do not see any signs of active infection locally nor systemically which is great news. No fevers, chills, nausea, vomiting, or diarrhea. 07-17-2022 upon evaluation today patient appears to be doing well currently in regard to her wound which is actually showing signs of improvement this is slow but nonetheless prevalent that we are seeing good improvement here. Fortunately I do not see any signs of active infection locally nor systemically which is great news. 07-24-2022 upon evaluation today patient appears to be doing well currently in regard to her wound although the wrap that we had on has been slipping down and she really does not have anybody to help her with this at home health is not going to be coming out we could not get anybody that would actually be able to do the dressing changes. Fortunately I do not see any signs of active infection locally nor systemically which is great news. 08-07-22 poorly in regard to her leg compared to what it was previous. Fortunately there does not appear to be any signs of  active infection locally nor systemically which is great news. No fevers, chills, nausea, vomiting, or diarrhea. With that being said it does appear that the patient may have some cellulitis in the leg which is not good. 08-14-2022 upon evaluation today patient appears to be doing somewhat better in regard to her wounds she still is having quite a bit of pain we are still not where we want to be as  far as healing is concerned completely. Fortunately I do not see any evidence of active infection locally nor systemically which is great news and I am very pleased in that regard. Jamie [name], Jamie [name] ([id number]) [id number] Page 3 of 10 08-21-23 upon evaluation today patient appears to be doing poorly currently in regard to her wound. She has been tolerating the dressing changes without complication. Fortunately there does not appear to be any signs of active infection locally nor systemically at this time. 08-28-2022 upon evaluation today patient appears to be doing poorly in regard to her wound this was not wrapped appropriately home health did not go up high enough on the wrap. This has caused some issues and I discussed that with the patient today. I do believe that she is going to require aggressive wrapping and treatment which she is getting the [facility] topical antibiotics today they can start using that at the next wrap on Friday. In the meantime they need to make sure that the rapid and appropriately we wrote very specific orders today. 09-04-2022 upon evaluation today patient appears to be doing well currently in regard to her wound which I think is making progress is still hurting her quite significantly. Fortunately I do not see any signs of active infection locally or systemically which is great news. No fevers, chills, nausea, vomiting, or diarrhea. 09-11-2022 upon evaluation today patient's wound actually is showing signs of being a little bit smaller and looking a  little bit better she still has a lot going on here however. Fortunately I do not see any evidence of active infection Worsening locally nor systemically which is great news. With that being said worsening still continue to use the topical [facility] antibiotics. 09-18-2022 upon evaluation today patient actually showing some signs of improvement. I am actually very pleased with where we stand compared to where we have been. I think that she is making good headway here. She is still having quite a bit of pain but it seems to be lessening compared to previous. 09-25-2022 upon evaluation patient's wound actually showing signs slowly but surely of improvement. Fortunately I do not see any signs of active infection at this time systemically and locally I feel like this is dramatically improved. She is doing well with the [facility] topical antibiotics. 10-02-2022 upon evaluation today patient appears to be doing better in regard to her wound overall. I am very pleased with where things stand from a visual standpoint I do not see any signs of active infection and in general I think that we are moving in the right direction here. 10-09-2022 upon evaluation today patient appears to be doing well currently in regard to her wound. She has been tolerating the dressing changes without complication. Fortunately I do not see any signs of active infection at this time which is great news. 10-16-2022 upon evaluation today patient continues to have quite a bit of pain in regard to her right leg. We have been attempting to debride little by little as we could but again was pretty limited by the fact that she is having significant discomfort. We do not actually have any signs of active infection going on at this point that the [facility] topical antibiotics have been helping quite readily. I been attempting to do as much debridement as I can but if she were to have pain medication this would actually help and in the past when she  did it was much more tolerable for her as far as taking the edge off.  Right now she has been without as she is in transition from her previous pain management physician to someone new to manage this. Electronic Signature(s) Signed: 10/16/2022 10:57:56 AM By: Jamie Derry PA-C Entered By: Jamie [name] on 10/16/2022 10:57:56 -------------------------------------------------------------------------------- Physical Exam Details Patient Name: Date of Service: Jamie Krill. 10/16/2022 10:15 A M Medical Record Number: [id number] Patient Account Number: [id number] Date of Birth/Sex: Treating RN: January 26, 1941 (82 y.o. F) Primary Care Provider: Delorise [name] Other Clinician: Referring Provider: Treating Provider/Extender: Jamie [name], Jamie [name] in Treatment: 21 Constitutional Well-nourished and well-hydrated in no acute distress. Respiratory normal breathing without difficulty. Psychiatric this patient is able to make decisions and demonstrates good insight into disease process. Alert and Oriented x 3. pleasant and cooperative. Notes Upon inspection patient's wound bed actually showed signs of some slight slough and biofilm buildup. With that being said I do not see any evidence of active infection locally nor systemically which is great news I think the [facility] topical antibiotics is helping to keep this under control which is excellent. With that being said I do believe that she would benefit from a continuation of therapy as such and I do believe that we will probably need to perform some debridement next week though I am giving her a break this week she is very thankful for that I am hopeful by next week she may have some pain medication to help with the more significant discomfort to take the edge off. Electronic Signature(s) Signed: 10/16/2022 10:58:23 AM By: Jamie Derry PA-C Entered By: Jamie [name] on 10/16/2022 10:58:23 [name]  ([id number]) [id number] Page 4 of 10 -------------------------------------------------------------------------------- Physician Orders Details Patient Name: Date of Service: Jamie [name], [name]. 10/16/2022 10:15 A M Medical Record Number: [id number] Patient Account Number: [id number] Date of Birth/Sex: Treating RN: 06/16/1940 (82 y.o. 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 Leonard Schwartz Wednesday Room 8 weekly Return A ppointment in 2 [name]. Leonard Schwartz Wednesday Room 8 weekly Return A Other: - home health to wash right leg and foot with soap (dial soap) and water with dressing changes. Patient to have soap and towels next to her when Home Health arrives at the house. *****[facility] compounding topical antibiotics. FOLLOW THE PHARMACY INSTRUCTIONS ON HOW TO MIX, and apply under the hydrofera blue.****** ****PLEASE APPLY ZINC OXIDE as needed AROUND THE PERIWOUND DUE TO MACERATION. ****Patient to bring in the [facility] topical antibiotics to each wound care appt as well.***** *****HOME HEALTH TO PLEASE WRAP FROM BASE OF TOES TO JUST BELOW KNEE. please appy first layer of unna boot at the bast of the toes and just below the knee to help hold the compression wrap in place.**** Anesthetic Wound #5 Right,Lateral Lower Leg (In  clinic) Topical Lidocaine 5% applied to wound bed (In clinic) Topical Lidocaine 4% applied to wound bed Bathing/ Shower/ Hygiene May shower and wash wound with soap and water. - with dressing changes. Edema Control - Lymphedema / SCD / Other Right  Lower Extremity Elevate legs to the level of the heart or above for 30 minutes daily and/or when sitting for 3-4 times a day throughout the day. Avoid standing for long periods of time. Exercise regularly Home Health Wound #5 Right,Lateral Lower Leg No change in wound care orders this week; continue Home Health for wound care. May utilize formulary equivalent dressing for wound treatment orders unless otherwise specified. Other Home Health Orders/Instructions: - [facility] in [place], [place] fax # [phone]. Wound Treatment Wound #5 - Lower Leg Wound Laterality: Right, Lateral Cleanser: Soap and Water 3 x Per Week/30 Days Discharge Instructions: May shower and wash wound with dial antibacterial soap and water prior to dressing change. Peri-Wound Care: Zinc Oxide Ointment 30g tube 3 x Per Week/30 Days Discharge Instructions: Apply Zinc Oxide to periwound with each dressing change TO ANY MACERATED SKIN. Peri-Wound Care: Sween Lotion (Moisturizing lotion) 3 x Per Week/30 Days Discharge Instructions: Apply moisturizing lotion as directed Topical: compounding topical antibiotics 3 x Per Week/30 Days Discharge Instructions: apply directly to wound bed under the hydrofera blue. Home Health start. MIX ACCORDING TO THE PHARMACY INSTRUCTIONS. Prim Dressing: Hydrofera Blue Ready Transfer Foam, 4x5 (in/in) (Generic) 3 x Per Week/30 Days ary Discharge Instructions: Apply over the topical antibiotics wound bed as instructed AFIYA, FERREBEE ([id number]) [id number] Page 5 of 10 Secondary Dressing: ABD Pad, 8x10 3 x Per Week/30 Days Discharge Instructions: Apply over primary dressing as directed. Secondary  Dressing: Woven Gauze Sponge, Non-Sterile 4x4 in 3 x Per Week/30 Days Discharge Instructions: Apply over primary dressing as directed. Secondary Dressing: Zetuvit Plus 4x8 in 3 x Per Week/30 Days Discharge Instructions: Or double ABD pad Apply over primary dressing as directed. Compression Wrap: Kerlix Roll 4.5x3.1 (in/yd) 3 x Per Week/30 Days Discharge Instructions: Apply Kerlix and Coban compression as directed. Compression Wrap: Coban Self-Adherent Wrap 4x5 (in/yd) 3 x Per Week/30 Days Discharge Instructions: Apply over Kerlix as directed. Compression Wrap: unna boot FIRST LAYER **** SEE INSTRUCTIONS 3 x Per Week/30 Days Discharge Instructions: APPLY FIRST LAYER UNNA BOOT AT BASES OF TOES AND JUST BELOW THE KNEE TO HOLD COMPRESSION WRAP IN PLACE. Electronic Signature(s) Signed: 10/16/2022 5:48:19 PM By: Jamie Derry PA-C Signed: 10/16/2022 6:00:37 PM By: Shawn Stall RN, BSN Entered By: Shawn Stall on 10/16/2022 10:49:33 -------------------------------------------------------------------------------- Problem List Details Patient Name: Date of Service: Jamie [name], [name] 10/16/2022 10:15 A M Medical Record Number: [id number] Patient Account Number: [id number] Date of Birth/Sex: Treating RN: October 31, 1940 (82 y.o. 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 05/22/2022 No Yes lower extremity L97.812 Non-pressure chronic ulcer of other part of right lower leg with fat layer 05/22/2022 No Yes exposed L97.822 Non-pressure chronic ulcer of other part of left lower leg with fat layer exposed2/21/2024 No Yes I25.10 Atherosclerotic heart disease of native coronary artery without angina pectoris  05/22/2022 No Yes Inactive Problems Resolved Problems Electronic Signature(s) Signed: 10/16/2022 10:28:12 AM By: Enis Slipper, Lysle Rubens ([id number]) [id number] Page 6 of 10 Entered By: Jamie [name] on 10/16/2022 10:28:12 -------------------------------------------------------------------------------- Progress Note Details Patient Name: Date of Service: Jamie [name], [name]. 10/16/2022 10:15 A M Medical Record Number: [id number] Patient Account Number: [id number] Date of Birth/Sex: Treating RN: 27-Jan-1941 (82 y.o. F) Primary Care Provider: Delorise [name] Other Clinician: Referring Provider: Treating  Provider/Extender: [name] [name] in Treatment: 21 Subjective Chief Complaint Information obtained from Patient Bilateral LE Ulcers History of Present Illness (HPI) ADMISSION 06/30/2020 Mrs. Charrette is an 82 year old woman who lives in [place]. She is here with her niece for review of wounds on the left medial lower leg and ankle. These have apparently been present for over a year and she followed with Jamie [name] at the [facility] in [place] for quite a period of time although it looks as though there was a initial consult wound from Jamie [name] on February 18 presumably there was therefore hiatus. At that point the wounds were described as being there for 3 months. She also tells me she was at the wound care center in [place] for a period of time with this. 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 She had venous studies that were negative for DVT ABIs on the right were 1.01 on the left 1.06. She has . had previous applications of puraply, compression which she does not tolerate very well. She has had several rounds of oral antibiotic therapy. She complains of unrelenting pain and she is seeing Jamie [name] of pain management apparently was on oxycodone  but that did not help. She also had a skin biopsy done by Jamie [name] although we do not have that result. She does not appear to have an arterial issue. I am not completely clear what she has been putting on the wounds lately. 3/31; this patient has a particularly nasty set of wounds on the left medial ankle in the middle of what looks to be hemosiderin deposition secondary to chronic venous insufficiency. She has a lot of pain followed by pain management. Jamie [name] apparently did a biopsy of something on the left leg last fall what I would like to get this result. She has a history of MRSA treatment. I do not believe she had reflux studies but she did have DVT rule out studies. Previous ABIs have not suggested arterial insufficiency 4/7; difficult wounds on the left medial ankle probably chronic venous insufficiency. With considerable effort on behalf of our case manager we were able to finally to speak to somebody at the hospital in [place] who indicated that no biopsy of this area have been done even though the patient describes this in some detail and is even able to point out where she thinks the biopsy was done. We have been using Sorbact. The PCR culture I did showed polymicrobial identification with Pseudomonas, staph aureus, Peptostreptococcus. All of this and low titers. Resistance genes identified were MRSA, staph virulence gene and tetracycline. We are going to send this to [facility] for a topical antibiotic which is something that we have had good success with recently in large venous ulcers with a lot of purulent drainage. There would not be an easy oral alternative here possibly line escalated and ciprofloxacin if we need to use systemic antibiotic 4/15; difficult area on the left medial ankle. Most likely chronic venous insufficiency. I think she will probably need venous reflux study I think she had DVT rule outs but not venous reflux studies. We have not yet obtained the topical  antibiotics. She has home health changing the dressing we have been using Sorbact for adherent fibrinous debris on the surface. Very difficult to remove 4/22; patient presents for 1 week follow-up. She has been using sore back under compression wraps and these are changed 3 times a week  with home health. She also had [facility] antibiotics sent to her house and brought them in today. She has no complaints or issues today. 5/2; patient is here for follow-up. She has been using Sorbact under compression. Very painful wound. She has been using [facility] antibiotics. Not much improvement although the more medial part of the wound has cleaned up nicely and the larger part of the wound about 50% slough covered. Part of the issue here is that she had stays at both [facility] wound care center, [facility] wound care center and now [place]. Not sure if she has had venous reflux studies. As far as we are able to tell she did not have a biopsy. My notes state that she did not have venous reflux studies just DVT rule outs. 5/16; patient goes for venous reflux studies this afternoon. She says that wound was biopsied which sounds like punch biopsies by Dr. Lynden [name] we do not have these results. We are using Sorbact. Very difficult wound to debride 5/23; patient presents for 1 week follow-up. She reports tolerating the wraps well with sorbact underneath. She had ABIs and venous reflux studies done. She has no issues or complaints today. She denies acute signs of infection. 6/6; I have reviewed the patient's vascular studies. Wounds are on the left medial and posterior calf. She had significant reflux in the greater saphenous vein in the in the mid thigh, distal thigh knee and the small saphenous vein in the popliteal fossa. The vein diameters do not look too impressive though. She is going to see the vascular surgeon on Wednesday. She also had venous reflux in the right common femoral vein. She did not have any evidence of a DVT  or SVT I am wondering whether there is an ablation procedure that would benefit her in the greater saphenous vein on the left. . She tells [place] that home health put the dressing on too tight and she took off 1 layer. The swelling in her left leg is a little worse as a result of this. Not much change in the wound measurements so the surface of the wound looks better Her arterial studies showed an ABI on the right of 1.14 with a triphasic waveform and a great toe pressure of 0.89. On the left her ABIs were noncompressible at 1.34 but with triphasic waveforms and a TBI of 0.97. Her greater toe pressure was 122. There was no evidence of significant bilateral arterial disease 6/20 patient went to see Dr. Durwin Nora. He did not think she had significant arterial disease. In terms of her venous duplex on the right side there was no evidence of a DVT or SVT there was deep venous reflux involving the common femoral vein no superficial vein reflux on the left side there was no DVT or SVT there was no deep vein graft reflux there was some reflux in the greater saphenous vein from the mid thigh to the knee but the vein here was not dilated. He thought these were venous wounds he prescribed a compression pump but I am not sure who we ordered it from The patient has been approved for Apligraf. Still using silver collagen this week 7/5; Apligraf #1 7/19 Apligraf #2. Decent improvement in the condition of the wound bed. Epithelialization distal Jamie [name], Jamie [name] ([id number]) [id number] Page 7 of 10 8/2 Apligraf #3. No issues or complaints. Denies signs of infection. 8/17 Apligraf #4. No issues or concerns. Some complaints of pruritus and the rash. Our intake nurse brought up the fact  that she had previously indicated possible cotton layer sensitivity we will therefore use kerlix in the bottom layer of the compression 8/30; the patient comes in with the area on her left medial leg just about  healed. There is a superficial area more towards the tibia and a smaller open area distally everything else is epithelialized. I do not think she requires another Apligraf 9/13; left medial leg is healed. 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 Her edema control is good Readmisstion: 05-22-2022 upon evaluation today patient presents for initial inspection here in our clinic concerning issues that she has been having with a wound of the right lateral lower extremity. This is an area of a previous skin graft she tells me. With that being said she unfortunately had a scrape on this that occurred around 10 January. Since that time she has noted that this has just continued to get bigger and bigger in her niece who is present with her today actually states that 2 [name] ago when she saw that it was significantly smaller than what she sees currently. Obviously this is of utmost concern as we do not want this to continue to get larger when arrested and get moving in the right direction. Fortunately there does not appear to be any signs of systemic infection though locally I think we probably do have some infection present. I would obtain a culture to see what we have going on here and then we will subsequently see where things go going forward. Fortunately I think that she is in the right place at this point being at the wound center we can definitely do something to try to get this moving in the right direction. Patient does have a history of chronic venous insufficiency as well as coronary artery disease. In the past she has done well with compression wraps on the start with a 3 layer wrap I think she is probably can end up going to a 4-layer at some point but we will see how things do over the next week. She has been using wound cleanser along with she tells me a zinc and topical but again I am not sure exactly what that was. 05-29-2022 upon  evaluation today patient appears to be doing well currently in regard to her wound. This actually is showing signs of improvement I am happy in that regard unfortunately her left leg has an area on the shin that has opened. This is the region where one of the areas at least we have previously taken care of. Nonetheless this is small hoping we get it under control before things worsen significantly. 06-05-2022 upon evaluation today patient appears to be doing well currently in regard to her wounds. The actually seem to be fairly clean she is having still quite a bit of problem with pain at this point she would like to not have debridement today for all possible. With that being said I do believe that we are making some good progress I think the [facility] is doing a decent job here. 3/6; patient has had 3/6; patient with known severe chronic venous insufficiency. She apparently had a traumatic wound at home and has a reasonably substantial wound on the right lateral lower leg and a smaller one on the left anterior lower leg as well. We have been using Hydrofera Blue and Unna boots 3/13; patient presents for follow-up. We have been using Hydrofera Blue under Coflex to the legs  bilaterally. She has no issues or complaints today. 06-26-2022 upon evaluation today patient appears to be doing well currently in regard to her wound. This is measuring a little bit larger however. Fortunately I do not see any signs of infection this is on the right side. Fortunately the left side is actually completely healed which is great news. 07-03-2022 patient's wound unfortunately is continuing to show signs of being worse. Her compression which was the Tubigrip that I thought should be able to pull up actually slipped down and she was not able to pull that back up. With that being said that means that unfortunately her wound has continued to deteriorate since I last saw her. This is definitely not the direction that we are  looking for. I discussed with her that I do believe we need to go ahead and see about getting her started on antibiotics and I subsequently would also like to go ahead and see about getting things moving forward with regard to a wound culture and making a change up her dressings as well but this can be changed more frequently than just once a week. 07-10-2022 upon evaluation today patient actually appears to be doing much better. I do believe that the antibiotics have been beneficial for her. Fortunately I do not see any signs of active infection locally nor systemically which is great news. No fevers, chills, nausea, vomiting, or diarrhea. 07-17-2022 upon evaluation today patient appears to be doing well currently in regard to her wound which is actually showing signs of improvement this is slow but nonetheless prevalent that we are seeing good improvement here. Fortunately I do not see any signs of active infection locally nor systemically which is great news. 07-24-2022 upon evaluation today patient appears to be doing well currently in regard to her wound although the wrap that we had on has been slipping down and she really does not have anybody to help her with this at home health is not going to be coming out we could not get anybody that would actually be able to do the dressing changes. Fortunately I do not see any signs of active infection locally nor systemically which is great news. 08-07-22 poorly in regard to her leg compared to what it was previous. Fortunately there does not appear to be any signs of active infection locally nor systemically which is great news. No fevers, chills, nausea, vomiting, or diarrhea. With that being said it does appear that the patient may have some cellulitis in the leg which is not good. 08-14-2022 upon evaluation today patient appears to be doing somewhat better in regard to her wounds she still is having quite a bit of pain we are still not where we want to be  as far as healing is concerned completely. Fortunately I do not see any evidence of active infection locally nor systemically which is great news and I am very pleased in that regard. 08-21-23 upon evaluation today patient appears to be doing poorly currently in regard to her wound. She has been tolerating the dressing changes without complication. Fortunately there does not appear to be any signs of active infection locally nor systemically at this time. 08-28-2022 upon evaluation today patient appears to be doing poorly in regard to her wound this was not wrapped appropriately home health did not go up high enough on the wrap. This has caused some issues and I discussed that with the patient today. I do believe that she is going to require aggressive wrapping  and treatment which she is getting the [facility] topical antibiotics today they can start using that at the next wrap on Friday. In the meantime they need to make sure that the rapid and appropriately we wrote very specific orders today. 09-04-2022 upon evaluation today patient appears to be doing well currently in regard to her wound which I think is making progress is still hurting her quite significantly. Fortunately I do not see any signs of active infection locally or systemically which is great news. No fevers, chills, nausea, vomiting, or diarrhea. 09-11-2022 upon evaluation today patient's wound actually is showing signs of being a little bit smaller and looking a little bit better she still has a lot going on here however. Fortunately I do not see any evidence of active infection Worsening locally nor systemically which is great news. With that being said worsening still continue to use the topical [facility] antibiotics. 09-18-2022 upon evaluation today patient actually showing some signs of improvement. I am actually very pleased with where we stand compared to where we have been. I think that she is making good headway here. She is still  having quite a bit of pain but it seems to be lessening compared to previous. 09-25-2022 upon evaluation patient's wound actually showing signs slowly but surely of improvement. Fortunately I do not see any signs of active infection at this time systemically and locally I feel like this is dramatically improved. She is doing well with the [facility] topical antibiotics. 10-02-2022 upon evaluation today patient appears to be doing better in regard to her wound overall. I am very pleased with where things stand from a visual standpoint I do not see any signs of active infection and in general I think that we are moving in the right direction here. 10-09-2022 upon evaluation today patient appears to be doing well currently in regard to her wound. She has been tolerating the dressing changes without complication. Fortunately I do not see any signs of active infection at this time which is great news. 10-16-2022 upon evaluation today patient continues to have quite a bit of pain in regard to her right leg. We have been attempting to debride little by little as we Jamie [name], Jamie [name] ([id number]) [id number] Page 8 of 10 could but again was pretty limited by the fact that she is having significant discomfort. We do not actually have any signs of active infection going on at this point that the [facility] topical antibiotics have been helping quite readily. I been attempting to do as much debridement as I can but if she were to have pain medication this would actually help and in the past when she did it was much more tolerable for her as far as taking the edge off. Right now she has been without as she is in transition from her previous pain management physician to someone new to manage this. Objective Constitutional Well-nourished and well-hydrated in no acute distress. Vitals Time Taken: 10:18 AM, Height: 62 in, Weight: 171 lbs, BMI: 31.3, Temperature: 97.9 F, Pulse: 58 bpm,  Respiratory Rate: 20 breaths/min, Blood Pressure: 109/71 mmHg. Respiratory normal breathing without difficulty. Psychiatric this patient is able to make decisions and demonstrates good insight into disease process. Alert and Oriented x 3. pleasant and cooperative. General Notes: Upon inspection patient's wound bed actually showed signs of some slight slough and biofilm buildup. With that being said I do not see any evidence of active infection locally nor systemically which is great news I think the [facility] topical  antibiotics is helping to keep this under control which is excellent. With that being said I do believe that she would benefit from a continuation of therapy as such and I do believe that we will probably need to perform some debridement next week though I am giving her a break this week she is very thankful for that I am hopeful by next week she may have some pain medication to help with the more significant discomfort to take the edge off. Integumentary (Hair, Skin) Wound #5 status is Open. Original cause of wound was Trauma. The date acquired was: 04/17/2022. The wound has been in treatment 21 [name]. The wound is located on the Right,Lateral Lower Leg. The wound measures 12.4cm length x 8.5cm width x 0.2cm depth; 82.781cm^2 area and 16.556cm^3 volume. There is Fat Layer (Subcutaneous Tissue) exposed. There is no tunneling or undermining noted. There is a medium amount of serosanguineous drainage noted. The wound margin is distinct with the outline attached to the wound base. There is large (67-100%) red granulation within the wound bed. There is a small (1-33%) amount of necrotic tissue within the wound bed including Adherent Slough. The periwound skin appearance exhibited: Scarring, Dry/Scaly, Hemosiderin Staining. The periwound skin appearance did not exhibit: Callus, Crepitus, Excoriation, Induration, Rash, Maceration, Atrophie Blanche, Cyanosis, Ecchymosis, Mottled, Pallor, Rubor,  Erythema. The periwound has tenderness on palpation. 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 [facility] Schwartz Wednesday Room 8 weekly Return Appointment in 2 [name]. Leonard Schwartz Wednesday Room 8 weekly Other: - home health to wash right leg and foot with soap (dial soap) and water with dressing changes. Patient to have soap and towels next to her when Home Health arrives at the house. *****[facility] compounding topical antibiotics. FOLLOW THE PHARMACY INSTRUCTIONS ON HOW TO MIX, and apply under the hydrofera blue.****** ****PLEASE APPLY ZINC OXIDE as needed AROUND THE PERIWOUND DUE TO MACERATION. ****Patient to bring in the [facility] topical antibiotics to each wound care appt as well.***** *****HOME HEALTH TO PLEASE WRAP FROM BASE OF TOES TO JUST BELOW KNEE. please appy first layer of unna boot at the bast of the toes and just below the knee to help hold the compression wrap in place.**** Anesthetic: Wound #5 Right,Lateral Lower Leg: (In clinic) Topical Lidocaine 5% applied to wound bed (In clinic) Topical Lidocaine 4% applied to wound bed Bathing/ Shower/ Hygiene: May shower and wash wound with soap and water. - with dressing changes. Edema Control - Lymphedema / SCD / Other: Elevate legs to the level of the heart or above for 30 minutes daily and/or when sitting for 3-4 times a day throughout the day. Avoid standing for long periods of time. Exercise regularly Home Health: Wound #5 Right,Lateral Lower Leg: No change in wound care orders this week; continue Home Health for wound care. May utilize formulary  equivalent dressing for wound treatment orders unless otherwise specified. Other Home Health Orders/Instructions: - [facility] [place], [place] fax # [phone]. WOUND #5: - Lower Leg Wound Laterality: Right, Lateral Jamie [name], Jamie [name] ([id number]) [id number] Page 9 of 10 Cleanser: Soap and Water 3 x Per Week/30 Days Discharge Instructions: May shower and wash wound with dial antibacterial  soap and water prior to dressing change. Peri-Wound Care: Zinc Oxide Ointment 30g tube 3 x Per Week/30 Days Discharge Instructions: Apply Zinc Oxide to periwound with each dressing change TO ANY MACERATED SKIN. Peri-Wound Care: Sween Lotion (Moisturizing lotion) 3 x Per Week/30 Days Discharge Instructions: Apply moisturizing lotion as directed Topical: compounding topical antibiotics 3 x Per Week/30 Days Discharge Instructions: apply directly to wound bed under the hydrofera blue. Home Health start. MIX ACCORDING TO THE PHARMACY INSTRUCTIONS. Prim Dressing: Hydrofera Blue Ready Transfer Foam, 4x5 (in/in) (Generic) 3 x Per Week/30 Days ary Discharge Instructions: Apply over the topical antibiotics wound bed as instructed Secondary Dressing: ABD Pad, 8x10 3 x Per Week/30 Days Discharge Instructions: Apply over primary dressing as directed. Secondary Dressing: Woven Gauze Sponge, Non-Sterile 4x4 in 3 x Per Week/30 Days Discharge Instructions: Apply over primary dressing as directed. Secondary Dressing: Zetuvit Plus 4x8 in 3 x Per Week/30 Days Discharge Instructions: Or double ABD pad Apply over primary dressing as directed. Com pression Wrap: Kerlix Roll 4.5x3.1 (in/yd) 3 x Per Week/30 Days Discharge Instructions: Apply Kerlix and Coban compression as directed. Com pression Wrap: Coban Self-Adherent Wrap 4x5 (in/yd) 3 x Per Week/30 Days Discharge Instructions: Apply over Kerlix as directed. Com pression Wrap: unna boot FIRST LAYER **** SEE INSTRUCTIONS 3 x  Per Week/30 Days Discharge Instructions: APPLY FIRST LAYER UNNA BOOT AT BASES OF TOES AND JUST BELOW THE KNEE TO HOLD COMPRESSION WRAP IN PLACE. 1. I would recommend that we have the patient continue to monitor for any evidence of infection or worsening. In general I think that she is making good headway towards closure which is great news. With that being said she still has quite significant discomfort which is to be expected with the extent of her wound which is quite large on the right lower extremity. 2. I am good recommend as well that the patient should continue with the [facility] topical antibiotics I think this is doing very well. 3. And also can recommend that the patient should continue to utilize the [facility] followed by the ABD pad and Zetuvit's to help catching the drainage although the drainage is improving this is good news. She is especially tender on the back of the leg. 4. I am good recommend as well that it would be beneficial for the patient have pain medicine in the past when she did this she was able to take the edge off and it was a little bit more tolerable for [place] to do the debridement. Right now it has been very difficult due to the fact that she is not doing nearly as good from a pain perspective since she does not have pain medication from her previous pain management provider. She does have some when she is seeing next week and if they could help with her pain levels especially in light of the debridements that we have to do along with just general wrapping and wound care this would be greatly appreciated. We will see patient back for reevaluation in 1 week here in the clinic. If anything worsens or changes patient will contact our office for additional recommendations. Electronic Signature(s) Signed: 10/16/2022 11:00:02 AM By: Jamie Derry PA-C Entered By: Jamie [name] on 10/16/2022  11:00:02 -------------------------------------------------------------------------------- SuperBill Details Patient Name: Date of Service: Jamie [name], Christean Grief. 10/16/2022 Medical Record Number: [id number] Patient Account Number: [id number] Date of Birth/Sex: Treating RN: 09-06-1940 (82 y.o. 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-pressure chronic ulcer of other part of left lower leg with fat layer exposed I25.10 Atherosclerotic heart disease of native coronary artery without angina pectoris Facility Procedures : CPT4 Code: [id number] Description: 19147 - WOUND CARE VISIT-LEV 4 EST PT Modifier: Quantity: 1 Physician Procedures Jamie [name], Jamie [name] ([id number]): CPT4 Code Description [id number] [phone] - WC PHYS LEVEL 3 - EST PT ICD-10 Diagnosis Description I87.331 Chronic venous hypertension (idiopathic) with ulcer and inflamma L97.812 Non-pressure chronic ulcer of other part of  right lower leg with L97.822 Non-pressure chronic ulcer of other part of left lower leg with I25.10 Atherosclerotic heart disease of native coronary artery without [id number] Page 10 of 10: Quantity Modifier 1 tion of right lower extremity fat layer exposed fat layer exposed angina pectoris Electronic Signature(s) Signed: 10/16/2022 11:00:31 AM By: Jamie Derry PA-C Entered By: Jamie [name] on 10/16/2022 11:00:30

## 2022-10-17 NOTE — Progress Notes (Signed)
Jamie [name] ([id number]) [id number] Page 1 of 8 Visit Report for 10/16/2022 Arrival Information Details Patient Name: Date of Service: Jamie [name], [name]. 10/16/2022 10:15 A M Medical Record Number: [id number] Patient Account Number: [id number] Date of Birth/Sex: Treating RN: 1941/01/12 (82 y.o. Jamie [name] Primary Care Jamie [name]: Jamie [name] Other Clinician: Referring Jamie [name]: Treating Jamie [name]: Jamie [name], Jamie [name] in Treatment: 21 Visit Information History Since Last Visit Added or deleted any medications: No Patient Arrived: Wheel Chair Any new allergies or adverse reactions: No Arrival Time: 10:16 Had a fall or experienced change in No Accompanied By: niece activities of daily living that may affect Transfer Assistance: Manual risk of falls: Patient Identification Verified: Yes Signs or symptoms of abuse/neglect since last visito No Secondary Verification Process Completed: Yes Hospitalized since last visit: No Patient Requires Transmission-Based Precautions: No Implantable device outside of the clinic excluding No Patient Has Alerts: No cellular tissue based products placed in the center since last visit: Has Dressing in Place as Prescribed: Yes Has Compression in Place as Prescribed: Yes Pain Present Now: Yes Electronic Signature(s) Signed: 10/16/2022 6:00:37 PM By: Jamie Stall RN, BSN Entered By: Jamie [name] on 10/16/2022 10:24:36 -------------------------------------------------------------------------------- Clinic Level of Care Assessment Details Patient Name: Date of Service: Jamie [name]. 10/16/2022 10:15 A M Medical Record Number: [id number] Patient Account Number: [id number] Date of Birth/Sex: Treating RN: 1940/11/02 (82 y.o. Jamie [name] Primary Care Trachelle Low: Jamie [name] Other Clinician: Referring Jamie [name]: Treating Jamie [name]/Extender: Jamie [name], Jamie [name] in Treatment: 21 Clinic Level of Care Assessment Items TOOL 4 Quantity Score X- 1 0 Use when only an EandM is performed on FOLLOW-UP visit ASSESSMENTS - Nursing Assessment / Reassessment X- 1 10 Reassessment of Co-morbidities (includes updates in patient status) X- 1 5 Reassessment of Adherence to Treatment Plan ASSESSMENTS - Wound and Skin A ssessment / Reassessment []  - 0 Simple Wound Assessment / Reassessment - one wound X- 1 5 Complex Wound Assessment / Reassessment - multiple wounds X- 1 10 Dermatologic / Skin Assessment (not related to wound area) ASSESSMENTS - Focused Assessment X- 1 5 Circumferential Edema Measurements - multi extremities []  - 0 Nutritional Assessment / Counseling / Intervention Jamie [name] ([id number]) [id number] Page 2 of 8 []  - 0 Lower Extremity Assessment (monofilament, tuning fork, pulses) []  - 0 Peripheral Arterial Disease Assessment (using hand held doppler) ASSESSMENTS - Ostomy and/or Continence Assessment and Care []  - 0 Incontinence Assessment and Management []  - 0 Ostomy Care Assessment and Management (repouching, etc.) PROCESS - Coordination of Care []  - 0 Simple Patient / Family Education for ongoing care X- 1 20 Complex (extensive) Patient / Family Education for ongoing care X- 1 10 Staff obtains Chiropractor, Records, T Results / Process Orders est X- 1 10 Staff telephones HHA, Nursing Homes / Clarify orders / etc []  - 0 Routine Transfer to another Facility (non-emergent condition) []  - 0 Routine Hospital Admission (non-emergent condition) []  - 0 New Admissions / Manufacturing engineer / Ordering NPWT Apligraf, etc. , []  - 0 Emergency Hospital Admission (emergent condition) []  - 0 Simple Discharge Coordination X- 1 15 Complex (extensive) Discharge Coordination PROCESS - Special Needs []  - 0 Pediatric / Minor Patient Management []  - 0 Isolation  Patient Management []  - 0 Hearing / Language / Visual special needs []  - 0 Assessment of Community assistance (transportation, D/C planning, etc.) []  - 0 Additional assistance / Altered mentation []  - 0 Support Surface(s) Assessment (bed, cushion, seat,  etc.) INTERVENTIONS - Wound Cleansing / Measurement []  - 0 Simple Wound Cleansing - one wound X- 1 5 Complex Wound Cleansing - multiple wounds X- 1 5 Wound Imaging (photographs - any number of wounds) []  - 0 Wound Tracing (instead of photographs) []  - 0 Simple Wound Measurement - one wound X- 1 5 Complex Wound Measurement - multiple wounds INTERVENTIONS - Wound Dressings []  - 0 Small Wound Dressing one or multiple wounds []  - 0 Medium Wound Dressing one or multiple wounds X- 1 20 Large Wound Dressing one or multiple wounds []  - 0 Application of Medications - topical []  - 0 Application of Medications - injection INTERVENTIONS - Miscellaneous []  - 0 External ear exam []  - 0 Specimen Collection (cultures, biopsies, blood, body fluids, etc.) []  - 0 Specimen(s) / Culture(s) sent or taken to Lab for analysis []  - 0 Patient Transfer (multiple staff / Nurse, adult / Similar devices) []  - 0 Simple Staple / Suture removal (25 or less) []  - 0 Complex Staple / Suture removal (26 or more) []  - 0 Hypo / Hyperglycemic Management (close monitor of Blood Glucose) Jamie [name] ([id number]) [id number] Page 3 of 8 []  - 0 Ankle / Brachial Index (ABI) - do not check if billed separately X- 1 5 Vital Signs Has the patient been seen at the hospital within the last three years: Yes Total Score: 130 Level Of Care: New/Established - Level 4 Electronic Signature(s) Signed: 10/16/2022 6:00:37 PM By: Jamie Stall RN, BSN Entered By: Jamie [name] on 10/16/2022 10:50:33 -------------------------------------------------------------------------------- Encounter Discharge Information Details Patient Name:  Date of Service: Jamie [name], Jamie [name] 10/16/2022 10:15 A M Medical Record Number: [id number] Patient Account Number: [id number] Date of Birth/Sex: Treating RN: 09/24/1940 (83 y.o. Jamie [name] Primary Care Jamie [name]: Jamie [name] Other Clinician: Referring Jamie [name]: Treating Jamie Ackman/Extender: Jamie [name], Billey Gosling in Treatment: 21 Encounter Discharge Information Items Discharge Condition: Stable Ambulatory Status: Wheelchair Discharge Destination: Home Transportation: Private Auto Accompanied By: niece Schedule Follow-up Appointment: Yes Clinical Summary of Care: Electronic Signature(s) Signed: 10/16/2022 6:00:37 PM By: Jamie Stall RN, BSN Entered By: Jamie [name] on 10/16/2022 10:51:03 -------------------------------------------------------------------------------- Lower Extremity Assessment Details Patient Name: Date of Service: Jamie [name]. 10/16/2022 10:15 A M Medical Record Number: [id number] Patient Account Number: [id number] Date of Birth/Sex: Treating RN: 1940-11-18 (82 y.o. Jamie [name] Primary Care Edie Darley: Jamie [name] Other Clinician: Referring Chilton Sallade: Treating Chaunce Winkels/Extender: Jamie [name], Jamie [name] in Treatment: 21 Edema Assessment Assessed: [Left: No] [Right: Yes] Edema: [Left: Ye] [Right: s] Calf Left: Right: Point of Measurement: From Medial Instep 34 cm Ankle Left: Right: Point of Measurement: From Medial Instep 27 cm Vascular Assessment Left: [[id number] Page 4 of 8Right:] Pulses: Dorsalis Pedis Palpable: [[id number] Page 4 of 8Yes] Extremity colors, hair growth, and conditions: Extremity Color: [[id number] Page 4 of 8Normal] Hair Growth on Extremity: [[id number].pdf Page 4 of 8No] Temperature of Extremity: [[id number].pdf Page  4 of 8Warm] Capillary Refill: [[id number].pdf Page 4 of 8< 3 seconds] Dependent Rubor: [[id number].pdf Page 4 of 8No] Blanched when Elevated: [[id number].pdf Page 4 of 8No No] Toe Nail Assessment Left: Right: Thick: Yes Discolored: Yes Deformed: No Improper Length and Hygiene: Yes Electronic Signature(s) Signed: 10/16/2022 6:00:37 PM By: Jamie Stall RN, BSN Entered By: Jamie [name] on 10/16/2022 10:25:21 -------------------------------------------------------------------------------- Multi-Disciplinary Care Plan Details Patient Name: Date of Service: Jamie [name], Jamie [name] 10/16/2022 10:15 A M Medical Record Number: [id number] Patient Account Number: [id number] Date of Birth/Sex: Treating RN: [birth year] (82  y.o. Jamie [name] Primary Care Vanisha Whiten: Jamie [name] Other Clinician: Referring Krystyl Cannell: Treating Minka Knight/Extender: [name] in Treatment: 21 Multidisciplinary Care Plan reviewed with physician Active Inactive Pain, Acute or Chronic Nursing Diagnoses: Pain Management - Cyclic Acute (Dressing Change Related) Pain Management - Non-cyclic Acute (Procedural) Pain, acute or chronic: actual or potential Goals: Patient will verbalize adequate pain control and receive pain control interventions during procedures as needed Date Initiated: 09/11/2022 Target Resolution Date: 02/06/2023 Goal Status: Active Patient/caregiver will verbalize adequate pain control between visits Date Initiated: 09/11/2022 Target Resolution Date: 02/06/2023 Goal Status: Active Patient/caregiver will verbalize comfort level met Date Initiated: 09/11/2022 Target Resolution Date: 02/06/2023 Goal Status: Active Interventions: Complete pain assessment as per visit requirements Encourage patient to take pain medications as prescribed Provide education on pain management Provision of  support: recognize patient pain, provide comfort and support as needed Reposition patient for comfort Treatment Activities: Administer pain control measures as ordered : 09/11/2022 HEIDY, MCCUBBIN ([id number]) [phone].pdf Page 5 of 8 Notes: Electronic Signature(s) Signed: 10/16/2022 6:00:37 PM By: Jamie Stall RN, BSN Entered By: Jamie [name] on 10/16/2022 10:32:24 -------------------------------------------------------------------------------- Pain Assessment Details Patient Name: Date of Service: Jamie [name], Jamie [name] 10/16/2022 10:15 A M Medical Record Number: [id number] Patient Account Number: [id number] Date of Birth/Sex: Treating RN: March 30, 1941 (82 y.o. Jamie [name] Primary Care Palmer Shorey: Jamie [name] Other Clinician: Referring Kevina Piloto: Treating Obdulio Mash/Extender: Jamie [name], Jamie [name] in Treatment: 21 Active Problems Location of Pain Severity and Description of Pain Patient Has Paino Yes Site Locations Pain Location: Generalized Pain, Pain in Ulcers Rate the pain. Current Pain Level: 4 Pain Management and Medication Current Pain Management: Electronic Signature(s) Signed: 10/16/2022 6:00:37 PM By: Jamie Stall RN, BSN Entered By: Jamie [name] on 10/16/2022 10:24:56 -------------------------------------------------------------------------------- Patient/Caregiver Education Details Patient Name: Date of Service: Jamie [name] 7/10/2024andnbsp10:15 A M Medical Record Number: [id number] Patient Account Number: [id number] Date of Birth/Gender: Treating RN: 04-Feb-1941 (82 y.o. Jamie [name] Primary Care Physician: Jamie [name] Other Clinician: Referring Physician: Treating Physician/Extender: Jamie [name], Billey Gosling in Treatment: [address] KENNEISHA, COCHRANE ([id number]) [id number].pdf Page 6 of 8 Education  Provided To: Patient Education Topics Provided Wound/Skin Impairment: Handouts: Caring for Your Ulcer Methods: Explain/Verbal Responses: Reinforcements needed Electronic Signature(s) Signed: 10/16/2022 6:00:37 PM By: Jamie Stall RN, BSN Entered By: Jamie [name] on 10/16/2022 10:32:40 -------------------------------------------------------------------------------- Wound Assessment Details Patient Name: Date of Service: Jamie Mart W. 10/16/2022 10:15 A M Medical Record Number: [id number] Patient Account Number: [id number] Date of Birth/Sex: Treating RN: 1940/08/10 (82 y.o. Debara Pickett, Yvonne Kendall Primary Care Everlina Gotts: Jamie [name] Other Clinician: Referring Margo Lama: Treating Caitlinn Klinker/Extender: Jamie [name], Jamie [name] in Treatment: 21 Wound Status Wound Number: 5 Primary Venous Leg Ulcer Etiology: Wound Location: Right, Lateral Lower Leg Wound Open Wounding Event: Trauma Status: Date Acquired: 04/17/2022 Comorbid Sleep Apnea, Hypertension, Peripheral Venous Disease, [name] Of Treatment: 21 History: Osteoarthritis, Neuropathy Clustered Wound: Yes Photos Wound Measurements Length: (cm) Width: (cm) Depth: (cm) Clustered Quantity: Area: (cm) Volume: (cm) 12.4 % Reduction in Area: -216.3% 8.5 % Reduction in Volume: -110.9% 0.2 Epithelialization: Small (1-33%) 4 Tunneling: No 82.781 Undermining: No 16.556 Wound Description Classification: Full Thickness Without Exposed Supp Wound Margin: Distinct, outline attached Exudate Amount: Medium Exudate Type: Serosanguineous Exudate Color: red, brown ort Structures Foul Odor After Cleansing: No Slough/Fibrino Yes Wound Bed Granulation Amount: Large (67-100%) Exposed Structure Granulation Quality: Red Fascia Exposed: No Necrotic Amount: Small (1-33%) Fat  Layer (Subcutaneous Tissue) Exposed: Yes Necrotic Quality: Adherent Slough Tendon Exposed: No Jamie [name], Jamie [name] ([id number])  [id number] Page 7 of 8 Muscle Exposed: No Joint Exposed: No Bone Exposed: No Periwound Skin Texture Texture Color No Abnormalities Noted: No No Abnormalities Noted: No Callus: No Atrophie Blanche: No Crepitus: No Cyanosis: No Excoriation: No Ecchymosis: No Induration: No Erythema: No Rash: No Hemosiderin Staining: Yes Scarring: Yes Mottled: No Pallor: No Moisture Rubor: No No Abnormalities Noted: No Dry / Scaly: Yes Temperature / Pain Maceration: No Tenderness on Palpation: Yes Treatment Notes Wound #5 (Lower Leg) Wound Laterality: Right, Lateral Cleanser Soap and Water Discharge Instruction: May shower and wash wound with dial antibacterial soap and water prior to dressing change. Peri-Wound Care Zinc Oxide Ointment 30g tube Discharge Instruction: Apply Zinc Oxide to periwound with each dressing change TO ANY MACERATED SKIN. Sween Lotion (Moisturizing lotion) Discharge Instruction: Apply moisturizing lotion as directed Topical compounding topical antibiotics Discharge Instruction: apply directly to wound bed under the hydrofera blue. Home Health start. MIX ACCORDING TO THE PHARMACY INSTRUCTIONS. Primary Dressing Hydrofera Blue Ready Transfer Foam, 4x5 (in/in) Discharge Instruction: Apply over the topical antibiotics wound bed as instructed Secondary Dressing ABD Pad, 8x10 Discharge Instruction: Apply over primary dressing as directed. Woven Gauze Sponge, Non-Sterile 4x4 in Discharge Instruction: Apply over primary dressing as directed. Zetuvit Plus 4x8 in Discharge Instruction: Or double ABD pad Apply over primary dressing as directed. Secured With Compression Wrap Kerlix Roll 4.5x3.1 (in/yd) Discharge Instruction: Apply Kerlix and Coban compression as directed. Coban Self-Adherent Wrap 4x5 (in/yd) Discharge Instruction: Apply over Kerlix as directed. unna boot FIRST LAYER **** SEE INSTRUCTIONS Discharge Instruction: APPLY FIRST  LAYER UNNA BOOT AT BASES OF TOES AND JUST BELOW THE KNEE TO HOLD COMPRESSION WRAP IN PLACE. Compression Stockings Add-Ons Electronic Signature(s) Signed: 10/16/2022 6:00:37 PM By: Jamie Stall RN, BSN Entered By: Jamie [name] on 10/16/2022 10:31:32 [name] ([id number]) [id number] Page 8 of 8 -------------------------------------------------------------------------------- Vitals Details Patient Name: Date of Service: Jamie [name], [name]. 10/16/2022 10:15 A M Medical Record Number: [id number] Patient Account Number: [id number] Date of Birth/Sex: Treating RN: 1941-02-20 (82 y.o. Jamie [name] Primary Care Alda Gaultney: Jamie [name] Other Clinician: Referring Madilyn Cephas: Treating Kerina Simoneau/Extender: Jamie [name], Jamie [name] in Treatment: 21 Vital Signs Time Taken: 10:18 Temperature (F): 97.9 Height (in): 62 Pulse (bpm): 58 Weight (lbs): 171 Respiratory Rate (breaths/min): 20 Body Mass Index (BMI): 31.3 Blood Pressure (mmHg): 109/71 Reference Range: 80 - 120 mg / dl Electronic Signature(s) Signed: 10/16/2022 6:00:37 PM By: Jamie Stall RN, BSN Entered By: Jamie [name] on 10/16/2022 10:24:49

## 2022-10-23 ENCOUNTER — Encounter (HOSPITAL_BASED_OUTPATIENT_CLINIC_OR_DEPARTMENT_OTHER): Payer: Medicare HMO | Admitting: Physician Assistant

## 2022-10-23 DIAGNOSIS — I87331 Chronic venous hypertension (idiopathic) with ulcer and inflammation of right lower extremity: Secondary | ICD-10-CM | POA: Diagnosis not present

## 2022-10-23 NOTE — Progress Notes (Addendum)
RYLEAH, MIRAMONTES ([id number]) [id number] Page 1 of 11 Visit Report for 10/23/2022 Chief Complaint Document Details Patient Name: Date of Service: [name], [name]. 10/23/2022 10:15 A M Medical Record Number: [id number] Patient Account Number: [id number] Date of Birth/Sex: Treating RN: [birth year] (82 y.o. F) Primary Care Provider: [name] Other Clinician: Referring Provider: Treating Provider/Extender: [name], [name] in Treatment: 22 Information Obtained from: Patient Chief Complaint Bilateral LE Ulcers Electronic Signature(s) Signed: 10/23/2022 10:07:44 AM By: Allen Derry PA-C Entered By: Allen Derry on 10/23/2022 10:07:44 -------------------------------------------------------------------------------- Debridement Details Patient Name: Date of Service: [name], [name] 10/23/2022 10:15 A M Medical Record Number: [id number] Patient Account Number: [id number] Date of Birth/Sex: Treating RN: [birth year] (82 y.o. Debara Pickett, [place] Primary Care Provider: [name] Other Clinician: Referring Provider: Treating Provider/Extender: [name], [name] in Treatment: 22 Debridement Performed for Assessment: Wound #5 Right,Lateral Lower Leg Performed By: Physician Lenda Kelp, PA Debridement Type: Debridement Severity of Tissue Pre Debridement: Fat layer exposed Level of Consciousness (Pre-procedure): Awake and Alert Pre-procedure Verification/Time Out Yes - 10:50 Taken: Start Time: 10:51 Pain Control: Lidocaine 4% T opical Solution Percent of Wound Bed Debrided: 75% T Area Debrided (cm): otal 59.82 Tissue and other material debrided: Viable, Non-Viable, Slough, Subcutaneous, Skin: Dermis , Skin: Epidermis, Biofilm, Slough Level: Skin/Subcutaneous Tissue Debridement Description: Excisional Instrument: Curette Bleeding: Minimum Hemostasis Achieved:  Pressure End Time: 10:57 Procedural Pain: 0 Post Procedural Pain: 0 Response to Treatment: Procedure was tolerated well Level of Consciousness (Post- Awake and Alert procedure): Post Debridement Measurements of Total Wound Length: (cm) 12.7 Width: (cm) 8 Depth: (cm) 0.2 Volume: (cm) 15.959 Character of Wound/Ulcer Post Debridement: Improved SAYLA, GOLONKA ([id number]) [id number] Page 2 of 11 Severity of Tissue Post Debridement: Fat layer exposed Post Procedure Diagnosis Same as Pre-procedure Electronic Signature(s) Signed: 10/23/2022 4:26:17 PM By: Allen Derry PA-C Signed: 10/23/2022 5:02:36 PM By: Shawn Stall RN, BSN Entered By: Shawn Stall on 10/23/2022 11:02:42 -------------------------------------------------------------------------------- HPI Details Patient Name: Date of Service: [name], [place] [name] 10/23/2022 10:15 A M Medical Record Number: [id number] Patient Account Number: [id number] Date of Birth/Sex: Treating RN: [birth year] (82 y.o. F) Primary Care Provider: [name] Other Clinician: Referring Provider: Treating Provider/Extender: [name], [name] in Treatment: 22 History of Present Illness HPI Description: ADMISSION 06/30/2020 Mrs. Basista is an 82 year old woman who lives in [place]. She is here with her niece for review of wounds on the left medial lower leg and ankle. These have apparently been present for over a year and she followed with Dr. Olegario Messier at the [facility] in [place] for quite a period of time although it looks as though there was a initial consult wound from Dr. Marcha Solders on February 18 presumably there was therefore hiatus. At that point the wounds were described as being there for 3 months. She also tells me she was at the wound care center in [place] for a period of time with this. 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 She had venous studies that were negative for DVT ABIs on the right were 1.01 on the left 1.06. She has . had previous applications of puraply, compression which she does not tolerate very well. She has had several rounds of oral antibiotic therapy. She complains of unrelenting pain and she is seeing Dr. Reece Agar V of pain management apparently was on oxycodone but that did not help. She also had  a skin biopsy done by Dr. Olegario Messier although we do not have that result. She does not appear to have an arterial issue. I am not completely clear what she has been putting on the wounds lately. 3/31; this patient has a particularly nasty set of wounds on the left medial ankle in the middle of what looks to be hemosiderin deposition secondary to chronic venous insufficiency. She has a lot of pain followed by pain management. Dr. Marcha Solders apparently did a biopsy of something on the left leg last fall what I would like to get this result. She has a history of MRSA treatment. I do not believe she had reflux studies but she did have DVT rule out studies. Previous ABIs have not suggested arterial insufficiency 4/7; difficult wounds on the left medial ankle probably chronic venous insufficiency. With considerable effort on behalf of our case manager we were able to finally to speak to somebody at the hospital in [place] who indicated that no biopsy of this area have been done even though the patient describes this in some detail and is even able to point out where she thinks the biopsy was done. We have been using Sorbact. The PCR culture I did showed polymicrobial identification with Pseudomonas, staph aureus, Peptostreptococcus. All of this and low titers. Resistance genes identified were MRSA, staph virulence gene and tetracycline. We are going to send this to [facility] for a topical antibiotic which is something that we have had good success with recently in large venous ulcers with a lot of purulent  drainage. There would not be an easy oral alternative here possibly line escalated and ciprofloxacin if we need to use systemic antibiotic 4/15; difficult area on the left medial ankle. Most likely chronic venous insufficiency. I think she will probably need venous reflux study I think she had DVT rule outs but not venous reflux studies. We have not yet obtained the topical antibiotics. She has home health changing the dressing we have been using Sorbact for adherent fibrinous debris on the surface. Very difficult to remove 4/22; patient presents for 1 week follow-up. She has been using sore back under compression wraps and these are changed 3 times a week with home health. She also had [facility] antibiotics sent to her house and brought them in today. She has no complaints or issues today. 5/2; patient is here for follow-up. She has been using Sorbact under compression. Very painful wound. She has been using [facility] antibiotics. Not much improvement although the more medial part of the wound has cleaned up nicely and the larger part of the wound about 50% slough covered. Part of the issue here is that she had stays at both [facility] wound care center, [facility] wound care center and now [place]. Not sure if she has had venous reflux studies. As far as we are able to tell she did not have a biopsy. My notes state that she did not have venous reflux studies just DVT rule outs. 5/16; patient goes for venous reflux studies this afternoon. She says that wound was biopsied which sounds like punch biopsies by Dr. Lynden Ang we do not have these results. We are using Sorbact. Very difficult wound to debride 5/23; patient presents for 1 week follow-up. She reports tolerating the wraps well with sorbact underneath. She had ABIs and venous reflux studies done. She has no issues or complaints today. She denies acute signs of infection. 6/6; I have reviewed the patient's vascular studies. Wounds are on the left medial and  posterior calf. She had significant reflux in the greater saphenous vein in the in the mid thigh, distal thigh knee and the small saphenous vein in the popliteal fossa. The vein diameters do not look too impressive though. She is going to see the vascular surgeon on Wednesday. She also had venous reflux in the right common femoral vein. She did not have any evidence of a DVT or SVT I am wondering whether there is an ablation procedure that would benefit her in the greater saphenous vein on the left. . She tells [place] that home health put the dressing on too tight and she took off 1 layer. The swelling in her left leg is a little worse as a result of this. Not much change in the wound measurements so the surface of the wound looks better Her arterial studies showed an ABI on the right of 1.14 with a triphasic waveform and a great toe pressure of 0.89. On the left her ABIs were noncompressible at 1.34 but with triphasic waveforms and a TBI of 0.97. Her greater toe pressure was 122. There was no evidence of significant bilateral arterial disease 6/20 patient went to see Dr. Durwin Nora. He did not think she had significant arterial disease. In terms of her venous duplex on the right side there was no evidence SREEJA, SPIES ([id number]) [id number] Page 3 of 11 of a DVT or SVT there was deep venous reflux involving the common femoral vein no superficial vein reflux on the left side there was no DVT or SVT there was no deep vein graft reflux there was some reflux in the greater saphenous vein from the mid thigh to the knee but the vein here was not dilated. He thought these were venous wounds he prescribed a compression pump but I am not sure who we ordered it from The patient has been approved for Apligraf. Still using silver collagen this week 7/5; Apligraf #1 7/19 Apligraf #2. Decent improvement in the condition of the wound bed. Epithelialization distal 8/2 Apligraf #3. No  issues or complaints. Denies signs of infection. 8/17 Apligraf #4. No issues or concerns. Some complaints of pruritus and the rash. Our intake nurse brought up the fact that she had previously indicated possible cotton layer sensitivity we will therefore use kerlix in the bottom layer of the compression 8/30; the patient comes in with the area on her left medial leg just about healed. There is a superficial area more towards the tibia and a smaller open area distally everything else is epithelialized. I do not think she requires another Apligraf 9/13; left medial leg is healed. 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 Her edema control is good Readmisstion: 05-22-2022 upon evaluation today patient presents for initial inspection here in our clinic concerning issues that she has been having with a wound of the right lateral lower extremity. This is an area of a previous skin graft she tells me. With that being said she unfortunately had a scrape on this that occurred around 10 January. Since that time she has noted that this has just continued to get bigger and bigger in her niece who is present with her today actually states that 2 weeks ago when she saw that it was significantly smaller than what she sees currently. Obviously this is of utmost concern as we do not want this to continue to get larger when arrested and get moving in the right direction. Fortunately there does not  appear to be any signs of systemic infection though locally I think we probably do have some infection present. I would obtain a culture to see what we have going on here and then we will subsequently see where things go going forward. Fortunately I think that she is in the right place at this point being at the wound center we can definitely do something to try to get this moving in the right direction. Patient does have a history of chronic venous insufficiency as  well as coronary artery disease. In the past she has done well with compression wraps on the start with a 3 layer wrap I think she is probably can end up going to a 4-layer at some point but we will see how things do over the next week. She has been using wound cleanser along with she tells me a zinc and topical but again I am not sure exactly what that was. 05-29-2022 upon evaluation today patient appears to be doing well currently in regard to her wound. This actually is showing signs of improvement I am happy in that regard unfortunately her left leg has an area on the shin that has opened. This is the region where one of the areas at least we have previously taken care of. Nonetheless this is small hoping we get it under control before things worsen significantly. 06-05-2022 upon evaluation today patient appears to be doing well currently in regard to her wounds. The actually seem to be fairly clean she is having still quite a bit of problem with pain at this point she would like to not have debridement today for all possible. With that being said I do believe that we are making some good progress I think the [facility] is doing a decent job here. 3/6; patient has had 3/6; patient with known severe chronic venous insufficiency. She apparently had a traumatic wound at home and has a reasonably substantial wound on the right lateral lower leg and a smaller one on the left anterior lower leg as well. We have been using Hydrofera Blue and Unna boots 3/13; patient presents for follow-up. We have been using Hydrofera Blue under Coflex to the legs bilaterally. She has no issues or complaints today. 06-26-2022 upon evaluation today patient appears to be doing well currently in regard to her wound. This is measuring a little bit larger however. Fortunately I do not see any signs of infection this is on the right side. Fortunately the left side is actually completely healed which is great news. 07-03-2022  patient's wound unfortunately is continuing to show signs of being worse. Her compression which was the Tubigrip that I thought should be able to pull up actually slipped down and she was not able to pull that back up. With that being said that means that unfortunately her wound has continued to deteriorate since I last saw her. This is definitely not the direction that we are looking for. I discussed with her that I do believe we need to go ahead and see about getting her started on antibiotics and I subsequently would also like to go ahead and see about getting things moving forward with regard to a wound culture and making a change up her dressings as well but this can be changed more frequently than just once a week. 07-10-2022 upon evaluation today patient actually appears to be doing much better. I do believe that the antibiotics have been beneficial for her. Fortunately I do not see any signs  of active infection locally nor systemically which is great news. No fevers, chills, nausea, vomiting, or diarrhea. 07-17-2022 upon evaluation today patient appears to be doing well currently in regard to her wound which is actually showing signs of improvement this is slow but nonetheless prevalent that we are seeing good improvement here. Fortunately I do not see any signs of active infection locally nor systemically which is great news. 07-24-2022 upon evaluation today patient appears to be doing well currently in regard to her wound although the wrap that we had on has been slipping down and she really does not have anybody to help her with this at home health is not going to be coming out we could not get anybody that would actually be able to do the dressing changes. Fortunately I do not see any signs of active infection locally nor systemically which is great news. 08-07-22 poorly in regard to her leg compared to what it was previous. Fortunately there does not appear to be any signs of active infection  locally nor systemically which is great news. No fevers, chills, nausea, vomiting, or diarrhea. With that being said it does appear that the patient may have some cellulitis in the leg which is not good. 08-14-2022 upon evaluation today patient appears to be doing somewhat better in regard to her wounds she still is having quite a bit of pain we are still not where we want to be as far as healing is concerned completely. Fortunately I do not see any evidence of active infection locally nor systemically which is great news and I am very pleased in that regard. 08-21-23 upon evaluation today patient appears to be doing poorly currently in regard to her wound. She has been tolerating the dressing changes without complication. Fortunately there does not appear to be any signs of active infection locally nor systemically at this time. 08-28-2022 upon evaluation today patient appears to be doing poorly in regard to her wound this was not wrapped appropriately home health did not go up high enough on the wrap. This has caused some issues and I discussed that with the patient today. I do believe that she is going to require aggressive wrapping and treatment which she is getting the [facility] topical antibiotics today they can start using that at the next wrap on Friday. In the meantime they need to make sure that the rapid and appropriately we wrote very specific orders today. 09-04-2022 upon evaluation today patient appears to be doing well currently in regard to her wound which I think is making progress is still hurting her quite significantly. Fortunately I do not see any signs of active infection locally or systemically which is great news. No fevers, chills, nausea, vomiting, or diarrhea. 09-11-2022 upon evaluation today patient's wound actually is showing signs of being a little bit smaller and looking a little bit better she still has a lot going on here however. Fortunately I do not see any evidence of  active infection Worsening locally nor systemically which is great news. With that being said worsening still continue to use the topical [facility] antibiotics. 09-18-2022 upon evaluation today patient actually showing some signs of improvement. I am actually very pleased with where we stand compared to where we have been. I think that she is making good headway here. She is still having quite a bit of pain but it seems to be lessening compared to previous. 09-25-2022 upon evaluation patient's wound actually showing signs slowly but surely of improvement. Fortunately I  do not see any signs of active infection at this time systemically and locally I feel like this is dramatically improved. She is doing well with the [facility] topical antibiotics. SONNIA, STRONG ([id number]) [id number] Page 4 of 11 10-02-2022 upon evaluation today patient appears to be doing better in regard to her wound overall. I am very pleased with where things stand from a visual standpoint I do not see any signs of active infection and in general I think that we are moving in the right direction here. 10-09-2022 upon evaluation today patient appears to be doing well currently in regard to her wound. She has been tolerating the dressing changes without complication. Fortunately I do not see any signs of active infection at this time which is great news. 10-16-2022 upon evaluation today patient continues to have quite a bit of pain in regard to her right leg. We have been attempting to debride little by little as we could but again was pretty limited by the fact that she is having significant discomfort. We do not actually have any signs of active infection going on at this point that the [facility] topical antibiotics have been helping quite readily. I been attempting to do as much debridement as I can but if she were to have pain medication this would actually help and in the past when she did it was much  more tolerable for her as far as taking the edge off. Right now she has been without as she is in transition from her previous pain management physician to someone new to manage this. 10-23-2022 upon evaluation patient appears to be doing excellent in regard to her wound. She has been tolerating the dressing changes without complication. Fortunately I do not see any evidence of active infection locally nor systemically which is great news. No fevers, chills, nausea, vomiting, or diarrhea. Electronic Signature(s) Signed: 10/23/2022 12:43:51 PM By: Allen Derry PA-C Entered By: Allen Derry on 10/23/2022 12:43:51 -------------------------------------------------------------------------------- Physical Exam Details Patient Name: Date of Service: [name] 10/23/2022 10:15 A M Medical Record Number: [id number] Patient Account Number: [id number] Date of Birth/Sex: Treating RN: 06/26/1940 (82 y.o. F) Primary Care Provider: [name] Other Clinician: Referring Provider: Treating Provider/Extender: [name], [name] in Treatment: 22 Constitutional Well-nourished and well-hydrated in no acute distress. Respiratory normal breathing without difficulty. Psychiatric this patient is able to make decisions and demonstrates good insight into disease process. Alert and Oriented x 3. pleasant and cooperative. Notes Upon inspection patient's wound actually showed signs of improvement. She seems to be doing extremely well and very pleased at this point with regard to her leg and how things appear. I do not see any signs of infection at this time which is great news. I think that the [facility] topical antibiotics is really doing a good job I did perform debridement today clearway necrotic debris and slough she tolerated that without complication postdebridement this is significantly improved. Electronic Signature(s) Signed: 10/23/2022 12:44:33 PM By: Allen Derry PA-C Entered By: Allen Derry on 10/23/2022 12:44:32 -------------------------------------------------------------------------------- Physician Orders Details Patient Name: Date of Service: [name], [name] 10/23/2022 10:15 A M Medical Record Number: [id number] Patient Account Number: [id number] Date of Birth/Sex: Treating RN: 04-06-1941 (82 y.o. [name] Primary Care Provider: [name] Other Clinician: Referring Provider: Treating Provider/Extender: [name], [name] in Treatment: 79 Verbal / Phone Orders: No Diagnosis Coding ICD-10 [address] 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 [facility] Schwartz Wednesday Room 8 weekly 10/30/2022 Return A ppointment in 2 weeks. Leonard Schwartz Wednesday Room 8 weekly 11/06/2022 Return A Other: - home health to wash right leg and foot with soap (dial soap) and water with dressing changes. Patient to have soap and towels next to her when Home Health arrives at the house. *****[facility] compounding topical antibiotics. FOLLOW THE PHARMACY INSTRUCTIONS ON HOW TO MIX, and apply under the hydrofera blue.****** ****PLEASE APPLY ZINC OXIDE as needed AROUND THE PERIWOUND DUE TO MACERATION. ****Patient to bring in the [facility] topical antibiotics to each wound care appt as well.***** *****HOME HEALTH TO PLEASE WRAP FROM BASE OF TOES TO JUST BELOW KNEE. please appy first layer of unna boot at the bast of the toes and just below the knee to  help hold the compression wrap in place.**** Anesthetic Wound #5 Right,Lateral Lower Leg (In clinic) Topical Lidocaine 5% applied to wound bed (In clinic) Topical Lidocaine 4% applied to wound bed Bathing/ Shower/ Hygiene May shower and wash wound with soap and water. - with dressing changes. Edema Control - Lymphedema / SCD / Other Right Lower Extremity Elevate legs to the level of the heart or above for 30 minutes daily and/or when sitting for 3-4 times a day throughout the day. Avoid standing for long periods of time. Exercise regularly Home Health Wound #5 Right,Lateral Lower Leg No change in wound care orders this week; continue Home Health for wound care. May utilize formulary equivalent dressing for wound treatment orders unless otherwise specified. Other Home Health Orders/Instructions: - [facility] [place], [place] fax # [phone]. Wound Treatment Wound #5 - Lower Leg Wound Laterality: Right, Lateral Cleanser: Soap and Water 3 x Per Week/30 Days Discharge Instructions: May shower and wash wound with dial antibacterial soap and water prior to dressing change. Peri-Wound Care: Zinc Oxide Ointment 30g tube 3 x Per Week/30 Days Discharge Instructions: Apply Zinc Oxide to periwound with each dressing change TO ANY MACERATED SKIN. Peri-Wound Care: Sween Lotion (Moisturizing lotion) 3 x Per Week/30 Days Discharge Instructions: Apply moisturizing lotion as directed Topical: compounding topical antibiotics 3 x Per Week/30 Days Discharge Instructions: apply directly to wound bed under the hydrofera blue. Home Health start. MIX ACCORDING TO THE PHARMACY INSTRUCTIONS. Prim Dressing: Hydrofera Blue Ready Transfer Foam, 4x5 (in/in) (Generic) 3 x Per Week/30 Days ary Discharge Instructions: Apply over the topical antibiotics wound bed as instructed Secondary Dressing: ABD Pad, 8x10 3 x Per Week/30 Days Discharge Instructions: Apply over primary dressing as  directed. Secondary Dressing: Woven Gauze Sponge, Non-Sterile 4x4 in 3 x Per Week/30 Days Discharge Instructions: Apply over primary dressing as directed. Secondary Dressing: Zetuvit Plus 4x8 in 3 x Per Week/30 Days Discharge Instructions: Or double ABD pad Apply over primary dressing as directed. Compression Wrap: Kerlix Roll 4.5x3.1 (in/yd) 3 x Per Week/30 Days Discharge Instructions: Apply Kerlix and Coban compression as directed. Compression Wrap: Coban Self-Adherent Wrap 4x5 (in/yd) 3 x Per Week/30 Days Discharge Instructions: Apply over Kerlix as directed. Compression Wrap: unna boot FIRST LAYER **** SEE INSTRUCTIONS 3 x Per Week/30 Days Discharge Instructions: APPLY FIRST LAYER UNNA BOOT AT BASES OF TOES  AND JUST BELOW THE KNEE TO HOLD COMPRESSION WRAP IN PLACE. TANINA, BARB ([id number]) [id number] Page 6 of 11 Electronic Signature(s) Signed: 10/23/2022 4:26:17 PM By: Allen Derry PA-C Signed: 10/23/2022 5:02:36 PM By: Shawn Stall RN, BSN Entered By: Shawn Stall on 10/23/2022 10:55:21 -------------------------------------------------------------------------------- Problem List Details Patient Name: Date of Service: [name], [name] 10/23/2022 10:15 A M Medical Record Number: [id number] Patient Account Number: [id number] Date of Birth/Sex: Treating RN: 08-22-1940 (82 y.o. 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 05/22/2022 No Yes lower extremity L97.812 Non-pressure chronic ulcer of other part of right lower leg with fat layer 05/22/2022 No Yes exposed L97.822 Non-pressure chronic ulcer of other part of left lower leg with fat layer  exposed2/21/2024 No Yes I25.10 Atherosclerotic heart disease of native coronary artery without angina pectoris 05/22/2022 No Yes Inactive Problems Resolved Problems Electronic Signature(s) Signed: 10/23/2022 10:07:38 AM By: Allen Derry PA-C Entered By: Allen Derry on 10/23/2022 10:07:38 -------------------------------------------------------------------------------- Progress Note Details Patient Name: Date of Service: [name], [name] 10/23/2022 10:15 A M Medical Record Number: [id number] Patient Account Number: [id number] Date of Birth/Sex: Treating RN: 01-06-1941 (82 y.o. F) Primary Care Provider: [name] Other Clinician: Referring Provider: Treating Provider/Extender: [name], [place] Weeks in Treatment: [address] [place], [place] W ([id number]) [id number] Page 7 of 11 Chief Complaint Information obtained from Patient Bilateral LE Ulcers History of Present Illness (HPI) ADMISSION 06/30/2020 Mrs. Buttrey is an 82 year old woman who lives in [place]. She is here with her niece for review of wounds on the left medial lower leg and ankle. These have apparently been present for over a year and she followed with Dr. Olegario Messier at the [facility] in [place] for quite a period of time although it looks as though there was a initial consult wound from Dr. Marcha Solders on February 18 presumably there was therefore hiatus. At that point the wounds were described as being there for 3 months. She also tells me she was at the wound care center in [place] for a period of time with this. 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 She had venous studies that were negative for DVT ABIs on the right were 1.01 on the left 1.06. She has . had previous applications of puraply, compression which she does not tolerate very well. She has had several rounds of oral antibiotic  therapy. She complains of unrelenting pain and she is seeing Dr. Reece Agar V of pain management apparently was on oxycodone but that did not help. She also had a skin biopsy done by Dr. Olegario Messier although we do not have that result. She does not appear to have an arterial issue. I am not completely clear what she has been putting on the wounds lately. 3/31; this patient has a particularly nasty set of wounds on the left medial ankle in the middle of what looks to be hemosiderin deposition secondary to chronic venous insufficiency. She has a lot of pain followed by pain management. Dr. Marcha Solders apparently did a biopsy of something on the left leg last fall what I would like to get this result. She has a history of MRSA treatment. I do not believe she had reflux studies but she did have DVT rule out studies. Previous ABIs  have not suggested arterial insufficiency 4/7; difficult wounds on the left medial ankle probably chronic venous insufficiency. With considerable effort on behalf of our case manager we were able to finally to speak to somebody at the hospital in [place] who indicated that no biopsy of this area have been done even though the patient describes this in some detail and is even able to point out where she thinks the biopsy was done. We have been using Sorbact. The PCR culture I did showed polymicrobial identification with Pseudomonas, staph aureus, Peptostreptococcus. All of this and low titers. Resistance genes identified were MRSA, staph virulence gene and tetracycline. We are going to send this to [facility] for a topical antibiotic which is something that we have had good success with recently in large venous ulcers with a lot of purulent drainage. There would not be an easy oral alternative here possibly line escalated and ciprofloxacin if we need to use systemic antibiotic 4/15; difficult area on the left medial ankle. Most likely chronic venous insufficiency. I think she will probably need venous  reflux study I think she had DVT rule outs but not venous reflux studies. We have not yet obtained the topical antibiotics. She has home health changing the dressing we have been using Sorbact for adherent fibrinous debris on the surface. Very difficult to remove 4/22; patient presents for 1 week follow-up. She has been using sore back under compression wraps and these are changed 3 times a week with home health. She also had [facility] antibiotics sent to her house and brought them in today. She has no complaints or issues today. 5/2; patient is here for follow-up. She has been using Sorbact under compression. Very painful wound. She has been using [facility] antibiotics. Not much improvement although the more medial part of the wound has cleaned up nicely and the larger part of the wound about 50% slough covered. Part of the issue here is that she had stays at both [facility] wound care center, [facility] wound care center and now [place]. Not sure if she has had venous reflux studies. As far as we are able to tell she did not have a biopsy. My notes state that she did not have venous reflux studies just DVT rule outs. 5/16; patient goes for venous reflux studies this afternoon. She says that wound was biopsied which sounds like punch biopsies by Dr. Lynden Ang we do not have these results. We are using Sorbact. Very difficult wound to debride 5/23; patient presents for 1 week follow-up. She reports tolerating the wraps well with sorbact underneath. She had ABIs and venous reflux studies done. She has no issues or complaints today. She denies acute signs of infection. 6/6; I have reviewed the patient's vascular studies. Wounds are on the left medial and posterior calf. She had significant reflux in the greater saphenous vein in the in the mid thigh, distal thigh knee and the small saphenous vein in the popliteal fossa. The vein diameters do not look too impressive though. She is going to see the vascular surgeon on  Wednesday. She also had venous reflux in the right common femoral vein. She did not have any evidence of a DVT or SVT I am wondering whether there is an ablation procedure that would benefit her in the greater saphenous vein on the left. . She tells [place] that home health put the dressing on too tight and she took off 1 layer. The swelling in her left leg is a little worse as a result of  this. Not much change in the wound measurements so the surface of the wound looks better Her arterial studies showed an ABI on the right of 1.14 with a triphasic waveform and a great toe pressure of 0.89. On the left her ABIs were noncompressible at 1.34 but with triphasic waveforms and a TBI of 0.97. Her greater toe pressure was 122. There was no evidence of significant bilateral arterial disease 6/20 patient went to see Dr. Durwin Nora. He did not think she had significant arterial disease. In terms of her venous duplex on the right side there was no evidence of a DVT or SVT there was deep venous reflux involving the common femoral vein no superficial vein reflux on the left side there was no DVT or SVT there was no deep vein graft reflux there was some reflux in the greater saphenous vein from the mid thigh to the knee but the vein here was not dilated. He thought these were venous wounds he prescribed a compression pump but I am not sure who we ordered it from The patient has been approved for Apligraf. Still using silver collagen this week 7/5; Apligraf #1 7/19 Apligraf #2. Decent improvement in the condition of the wound bed. Epithelialization distal 8/2 Apligraf #3. No issues or complaints. Denies signs of infection. 8/17 Apligraf #4. No issues or concerns. Some complaints of pruritus and the rash. Our intake nurse brought up the fact that she had previously indicated possible cotton layer sensitivity we will therefore use kerlix in the bottom layer of the compression 8/30; the patient comes in with the area on her  left medial leg just about healed. There is a superficial area more towards the tibia and a smaller open area distally everything else is epithelialized. I do not think she requires another Apligraf 9/13; left medial leg is healed. 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 Her edema control is good Readmisstion: 05-22-2022 upon evaluation today patient presents for initial inspection here in our clinic concerning issues that she has been having with a wound of the right lateral lower extremity. This is an area of a previous skin graft she tells me. With that being said she unfortunately had a scrape on this that occurred around 10 January. Since that time she has noted that this has just continued to get bigger and bigger in her niece who is present with her today actually states that 2 weeks ago when she saw that it was significantly smaller than what she sees currently. Obviously this is of utmost concern as we do not want this to continue to get larger when arrested and get moving in the right direction. Fortunately there does not appear to be any signs of systemic infection though locally I think we probably do have some infection present. I would obtain a culture to see what we have going on here and then we will subsequently see where things go going forward. Fortunately I think that she is in the right place at this point being at the wound center we can definitely do something to try to get this moving in the right direction. Patient does have a history of chronic venous insufficiency as well as coronary artery disease. In the past she has done well with compression wraps on the start with a 3 layer wrap I think she is probably can end up going to a 4-layer at some point but we will see how things do over the  next week. She has been using wound cleanser along with she tells me a zinc and topical but again I am not sure exactly what  that was. 05-29-2022 upon evaluation today patient appears to be doing well currently in regard to her wound. This actually is showing signs of improvement I am happy in that regard unfortunately her left leg has an area on the shin that has opened. This is the region where one of the areas at least we have previously taken care MALAYNA, NOORI ([id number]) [id number] Page 8 of 11 of. Nonetheless this is small hoping we get it under control before things worsen significantly. 06-05-2022 upon evaluation today patient appears to be doing well currently in regard to her wounds. The actually seem to be fairly clean she is having still quite a bit of problem with pain at this point she would like to not have debridement today for all possible. With that being said I do believe that we are making some good progress I think the [facility] is doing a decent job here. 3/6; patient has had 3/6; patient with known severe chronic venous insufficiency. She apparently had a traumatic wound at home and has a reasonably substantial wound on the right lateral lower leg and a smaller one on the left anterior lower leg as well. We have been using Hydrofera Blue and Unna boots 3/13; patient presents for follow-up. We have been using Hydrofera Blue under Coflex to the legs bilaterally. She has no issues or complaints today. 06-26-2022 upon evaluation today patient appears to be doing well currently in regard to her wound. This is measuring a little bit larger however. Fortunately I do not see any signs of infection this is on the right side. Fortunately the left side is actually completely healed which is great news. 07-03-2022 patient's wound unfortunately is continuing to show signs of being worse. Her compression which was the Tubigrip that I thought should be able to pull up actually slipped down and she was not able to pull that back up. With that being said that means that  unfortunately her wound has continued to deteriorate since I last saw her. This is definitely not the direction that we are looking for. I discussed with her that I do believe we need to go ahead and see about getting her started on antibiotics and I subsequently would also like to go ahead and see about getting things moving forward with regard to a wound culture and making a change up her dressings as well but this can be changed more frequently than just once a week. 07-10-2022 upon evaluation today patient actually appears to be doing much better. I do believe that the antibiotics have been beneficial for her. Fortunately I do not see any signs of active infection locally nor systemically which is great news. No fevers, chills, nausea, vomiting, or diarrhea. 07-17-2022 upon evaluation today patient appears to be doing well currently in regard to her wound which is actually showing signs of improvement this is slow but nonetheless prevalent that we are seeing good improvement here. Fortunately I do not see any signs of active infection locally nor systemically which is great news. 07-24-2022 upon evaluation today patient appears to be doing well currently in regard to her wound although the wrap that we had on has been slipping down and she really does not have anybody to help her with this at home health is not going to be coming out we could not get anybody  that would actually be able to do the dressing changes. Fortunately I do not see any signs of active infection locally nor systemically which is great news. 08-07-22 poorly in regard to her leg compared to what it was previous. Fortunately there does not appear to be any signs of active infection locally nor systemically which is great news. No fevers, chills, nausea, vomiting, or diarrhea. With that being said it does appear that the patient may have some cellulitis in the leg which is not good. 08-14-2022 upon evaluation today patient appears to be  doing somewhat better in regard to her wounds she still is having quite a bit of pain we are still not where we want to be as far as healing is concerned completely. Fortunately I do not see any evidence of active infection locally nor systemically which is great news and I am very pleased in that regard. 08-21-23 upon evaluation today patient appears to be doing poorly currently in regard to her wound. She has been tolerating the dressing changes without complication. Fortunately there does not appear to be any signs of active infection locally nor systemically at this time. 08-28-2022 upon evaluation today patient appears to be doing poorly in regard to her wound this was not wrapped appropriately home health did not go up high enough on the wrap. This has caused some issues and I discussed that with the patient today. I do believe that she is going to require aggressive wrapping and treatment which she is getting the [facility] topical antibiotics today they can start using that at the next wrap on Friday. In the meantime they need to make sure that the rapid and appropriately we wrote very specific orders today. 09-04-2022 upon evaluation today patient appears to be doing well currently in regard to her wound which I think is making progress is still hurting her quite significantly. Fortunately I do not see any signs of active infection locally or systemically which is great news. No fevers, chills, nausea, vomiting, or diarrhea. 09-11-2022 upon evaluation today patient's wound actually is showing signs of being a little bit smaller and looking a little bit better she still has a lot going on here however. Fortunately I do not see any evidence of active infection Worsening locally nor systemically which is great news. With that being said worsening still continue to use the topical [facility] antibiotics. 09-18-2022 upon evaluation today patient actually showing some signs of improvement. I am actually very  pleased with where we stand compared to where we have been. I think that she is making good headway here. She is still having quite a bit of pain but it seems to be lessening compared to previous. 09-25-2022 upon evaluation patient's wound actually showing signs slowly but surely of improvement. Fortunately I do not see any signs of active infection at this time systemically and locally I feel like this is dramatically improved. She is doing well with the [facility] topical antibiotics. 10-02-2022 upon evaluation today patient appears to be doing better in regard to her wound overall. I am very pleased with where things stand from a visual standpoint I do not see any signs of active infection and in general I think that we are moving in the right direction here. 10-09-2022 upon evaluation today patient appears to be doing well currently in regard to her wound. She has been tolerating the dressing changes without complication. Fortunately I do not see any signs of active infection at this time which is great news. 10-16-2022  upon evaluation today patient continues to have quite a bit of pain in regard to her right leg. We have been attempting to debride little by little as we could but again was pretty limited by the fact that she is having significant discomfort. We do not actually have any signs of active infection going on at this point that the [facility] topical antibiotics have been helping quite readily. I been attempting to do as much debridement as I can but if she were to have pain medication this would actually help and in the past when she did it was much more tolerable for her as far as taking the edge off. Right now she has been without as she is in transition from her previous pain management physician to someone new to manage this. 10-23-2022 upon evaluation patient appears to be doing excellent in regard to her wound. She has been tolerating the dressing changes without complication. Fortunately  I do not see any evidence of active infection locally nor systemically which is great news. No fevers, chills, nausea, vomiting, or diarrhea. Objective Constitutional Well-nourished and well-hydrated in no acute distress. Vitals Time Taken: 10:15 AM, Height: 62 in, Weight: 171 lbs, BMI: 31.3, Temperature: 98.4 F, Pulse: 55 bpm, Respiratory Rate: 18 breaths/min, Blood Pressure: 113/70 mmHg. LILLYANNA, GLANDON ([id number]) [id number] Page 9 of 11 Respiratory normal breathing without difficulty. Psychiatric this patient is able to make decisions and demonstrates good insight into disease process. Alert and Oriented x 3. pleasant and cooperative. General Notes: Upon inspection patient's wound actually showed signs of improvement. She seems to be doing extremely well and very pleased at this point with regard to her leg and how things appear. I do not see any signs of infection at this time which is great news. I think that the [facility] topical antibiotics is really doing a good job I did perform debridement today clearway necrotic debris and slough she tolerated that without complication postdebridement this is significantly improved. Integumentary (Hair, Skin) Wound #5 status is Open. Original cause of wound was Trauma. The date acquired was: 04/17/2022. The wound has been in treatment 22 weeks. The wound is located on the Right,Lateral Lower Leg. The wound measures 12.7cm length x 8cm width x 0.2cm depth; 79.796cm^2 area and 15.959cm^3 volume. There is Fat Layer (Subcutaneous Tissue) exposed. There is no tunneling or undermining noted. There is a medium amount of serosanguineous drainage noted. The wound margin is distinct with the outline attached to the wound base. There is medium (34-66%) red granulation within the wound bed. There is a medium (34-66%) amount of necrotic tissue within the wound bed including Adherent Slough. The periwound skin appearance exhibited:  Scarring, Dry/Scaly, Hemosiderin Staining. The periwound skin appearance did not exhibit: Callus, Crepitus, Excoriation, Induration, Rash, Maceration, Atrophie Blanche, Cyanosis, Ecchymosis, Mottled, Pallor, Rubor, Erythema. The periwound has tenderness on palpation. 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 There was a Excisional Skin/Subcutaneous Tissue Debridement with a total area of 59.82 sq cm performed by Lenda Kelp, PA. With the following instrument(s): Curette to remove Viable  and Non-Viable tissue/material. Material removed includes Subcutaneous Tissue, Slough, Skin: Dermis, Skin: Epidermis, and Biofilm after achieving pain control using Lidocaine 4% Topical Solution. A time out was conducted at 10:50, prior to the start of the procedure. A Minimum amount of bleeding was controlled with Pressure. The procedure was tolerated well with a pain level of 0 throughout and a pain level of 0 following the procedure. Post Debridement Measurements: 12.7cm length x 8cm width x 0.2cm depth; 15.959cm^3 volume. Character of Wound/Ulcer Post Debridement is improved. Severity of Tissue Post Debridement is: Fat layer exposed. Post procedure Diagnosis Wound #5: Same as Pre-Procedure Plan Follow-up Appointments: Return Appointment in 1 week. [facility] Wednesday Room 8 weekly 10/30/2022 Return Appointment in 2 weeks. [facility] Wednesday Room 8 weekly 11/06/2022 Other: - home health to wash right leg and foot  with soap (dial soap) and water with dressing changes. Patient to have soap and towels next to her when Home Health arrives at the house. *****[facility] compounding topical antibiotics. FOLLOW THE PHARMACY INSTRUCTIONS ON HOW TO MIX, and apply under the hydrofera blue.****** ****PLEASE APPLY ZINC OXIDE as needed AROUND THE PERIWOUND DUE TO MACERATION. ****Patient to bring in the [facility] topical antibiotics to each wound care appt as well.***** *****HOME HEALTH TO PLEASE WRAP FROM BASE OF TOES TO JUST BELOW KNEE. please appy first layer of unna boot at the bast of the toes and just below the knee to help hold the compression wrap in place.**** Anesthetic: Wound #5 Right,Lateral Lower Leg: (In clinic) Topical Lidocaine 5% applied to wound bed (In clinic) Topical Lidocaine 4% applied to wound bed Bathing/ Shower/ Hygiene: May shower and wash wound with soap and water. - with dressing changes. Edema Control - Lymphedema / SCD / Other: Elevate legs to the level of the heart or above for 30 minutes daily and/or when sitting for 3-4 times a day throughout the day. Avoid standing for long periods of time. Exercise regularly Home Health: Wound #5 Right,Lateral Lower Leg: No change in wound care orders this week; continue Home Health for wound care. May utilize formulary equivalent dressing for wound treatment orders unless otherwise specified. Other Home Health Orders/Instructions: - [facility] in [place], [place] fax # [phone]. WOUND #5: - Lower Leg Wound Laterality: Right, Lateral Cleanser: Soap and Water 3 x Per Week/30 Days Discharge Instructions: May shower and wash wound with dial antibacterial soap and water prior to dressing change. Peri-Wound Care: Zinc Oxide Ointment 30g tube 3 x Per Week/30 Days Discharge Instructions: Apply Zinc Oxide to periwound with each dressing change TO ANY MACERATED SKIN. TITA, TERHAAR ([id number])  [id number] Page 10 of 11 Peri-Wound Care: Sween Lotion (Moisturizing lotion) 3 x Per Week/30 Days Discharge Instructions: Apply moisturizing lotion as directed Topical: compounding topical antibiotics 3 x Per Week/30 Days Discharge Instructions: apply directly to wound bed under the hydrofera blue. Home Health start. MIX ACCORDING TO THE PHARMACY INSTRUCTIONS. Prim Dressing: Hydrofera Blue Ready Transfer Foam, 4x5 (in/in) (Generic) 3 x Per Week/30 Days ary Discharge Instructions: Apply over the topical antibiotics wound bed as instructed Secondary Dressing: ABD Pad, 8x10 3 x Per Week/30 Days Discharge Instructions: Apply over primary dressing as directed. Secondary Dressing: Woven Gauze Sponge, Non-Sterile 4x4 in 3 x Per Week/30 Days Discharge Instructions: Apply over primary dressing as directed. Secondary Dressing: Zetuvit Plus 4x8 in 3 x Per Week/30 Days Discharge Instructions: Or double ABD pad Apply over primary dressing as directed. Com pression Wrap: Kerlix Roll 4.5x3.1 (in/yd) 3 x Per  Week/30 Days Discharge Instructions: Apply Kerlix and Coban compression as directed. Com pression Wrap: Coban Self-Adherent Wrap 4x5 (in/yd) 3 x Per Week/30 Days Discharge Instructions: Apply over Kerlix as directed. Com pression Wrap: unna boot FIRST LAYER **** SEE INSTRUCTIONS 3 x Per Week/30 Days Discharge Instructions: APPLY FIRST LAYER UNNA BOOT AT BASES OF TOES AND JUST BELOW THE KNEE TO HOLD COMPRESSION WRAP IN PLACE. 1. I would recommend that we have the patient continue to monitor for any evidence of infection or worsening. Based on what I am seeing I do believe that we are making good headway towards closure. 2. I am also can recommend that we have the patient monitor for any signs of worsening. Right now though I really feel like she is making good improvement. We will see patient back for reevaluation in 1 week here in the clinic. If anything worsens or changes  patient will contact our office for additional recommendations. Electronic Signature(s) Signed: 10/23/2022 12:46:19 PM By: Allen Derry PA-C Entered By: Allen Derry on 10/23/2022 12:46:19 -------------------------------------------------------------------------------- SuperBill Details Patient Name: Date of Service: [name], [name] 10/23/2022 Medical Record Number: [id number] Patient Account Number: [id number] Date of Birth/Sex: Treating RN: 10/24/1940 (82 y.o. 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 Facility Procedures : CPT4 Code: [id number] Description: 11042 - DEB SUBQ TISSUE 20 SQ CM/< ICD-10 Diagnosis Description 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 I87.331 Chronic venous hypertension (idiopathic) with ulcer and inflammation of right  low Modifier: d er extremity Quantity: 1 : CPT4 Code: [id number] Description: 11045 - DEB SUBQ TISS EA ADDL 20CM ICD-10 Diagnosis Description 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 I87.331 Chronic venous hypertension (idiopathic) with ulcer and inflammation of right  low Modifier: d er extremity Quantity: 2 Physician Procedures : CPT4 Code Description Modifier FRADY, TADDEO ([id number]) [id number] Page 1 [id number] 11042 - WC PHYS SUBQ TISS 20 SQ CM  1 ICD-10 Diagnosis Description L97.812 Non-pressure chronic ulcer of other part of right lower leg  with fat layer exposed I87.331 Chronic venous hypertension (idiopathic) with ulcer and inflammation of right lower extremity Quantity: 1 of 11 : [id number] 11045 - WC PHYS SUBQ TISS EA ADDL 20 CM 2 ICD-10 Diagnosis Description 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d I87.331 Chronic venous hypertension (idiopathic) with ulcer and inflammation of  right lower extremity Quantity: Electronic Signature(s) Signed: 10/23/2022 12:47:38 PM By: Allen Derry PA-C Entered By: Allen Derry on 10/23/2022 12:47:38

## 2022-10-30 ENCOUNTER — Encounter (HOSPITAL_BASED_OUTPATIENT_CLINIC_OR_DEPARTMENT_OTHER): Payer: Medicare HMO | Admitting: Internal Medicine

## 2022-10-30 DIAGNOSIS — I87331 Chronic venous hypertension (idiopathic) with ulcer and inflammation of right lower extremity: Secondary | ICD-10-CM | POA: Diagnosis not present

## 2022-10-30 NOTE — Progress Notes (Signed)
Jamie, [name] ([id number]) [id number] Page 1 of 7 Visit Report for 05/29/2022 Arrival Information Details Patient Name: Date of Service: Jamie [name], [name]. 05/29/2022 1:30 PM Medical Record Number: [id number] Patient Account Number: [id number] Date of Birth/Sex: Treating RN: 1940-05-09 (82 y.o. F) Primary Care Arriah Wadle: [name] Other Clinician: Referring Kristyne Woodring: Treating Charliee Krenz/Extender: [name], [name] in Treatment: 1 Visit Information History Since Last Visit Added or deleted any medications: No Patient Arrived: Wheel Chair Any new allergies or adverse reactions: No Arrival Time: 13:44 Had a fall or experienced change in No Accompanied By: friend activities of daily living that may affect Transfer Assistance: Manual risk of falls: Patient Identification Verified: Yes Signs or symptoms of abuse/neglect since last visito No Secondary Verification Process Completed: Yes Hospitalized since last visit: No Implantable device outside of the clinic excluding No cellular tissue based products placed in the center since last visit: Has Dressing in Place as Prescribed: Yes Pain Present Now: Yes Electronic Signature(s) Signed: 10/30/2022 2:24:26 PM By: Karl Ito Entered By: Karl Ito on 05/29/2022 13:44:50 -------------------------------------------------------------------------------- Compression Therapy Details Patient Name: Date of Service: Gevena Mart W. 05/29/2022 1:30 PM Medical Record Number: [id number] Patient Account Number: [id number] Date of Birth/Sex: Treating RN: 1941-02-01 (82 y.o. [name] Primary Care Labib Cwynar: [name] Other Clinician: Referring Tayshun Gappa: Treating Ndea Kilroy/Extender: [name], [name] in Treatment: 1 Compression Therapy Performed for Wound Assessment: Wound #5 Right,Lateral Lower  Leg Performed By: Clinician Shawn Stall, RN Compression Type: Double Layer Post Procedure Diagnosis Same as Pre-procedure Electronic Signature(s) Signed: 05/29/2022 5:20:51 PM By: Shawn Stall RN, BSN Entered By: Shawn Stall on 05/29/2022 14:38:53 [name] ([id number]) [id number] Page 2 of 7 -------------------------------------------------------------------------------- Compression Therapy Details Patient Name: Date of Service: Jamie [name], [name]. 05/29/2022 1:30 PM Medical Record Number: [id number] Patient Account Number: [id number] Date of Birth/Sex: Treating RN: 11-16-1940 (82 y.o. [name] Primary Care Jaecob Lowden: [name] Other Clinician: Referring Royal Vandevoort: Treating Braedyn Riggle/Extender: [name], [name] in Treatment: 1 Compression Therapy Performed for Wound Assessment: Wound #6 Left,Anterior Lower Leg Performed By: Clinician Shawn Stall, RN Compression Type: Double Layer Post Procedure Diagnosis Same as Pre-procedure Electronic Signature(s) Signed: 05/29/2022 5:20:51 PM By: Shawn Stall RN, BSN Entered By: Shawn Stall on 05/29/2022 14:39:03 -------------------------------------------------------------------------------- Encounter Discharge Information Details Patient Name: Date of Service: Jamie [name], [name] 05/29/2022 1:30 PM Medical Record Number: [id number] Patient Account Number: [id number] Date of Birth/Sex: Treating RN: May 08, 1940 (82 y.o. [name] Primary Care Rontrell Moquin: [name] Other Clinician: Referring Davieon Stockham: Treating Zasha Belleau/Extender: [name], Billey Gosling in Treatment: 1 Encounter Discharge Information Items Discharge Condition: Stable Ambulatory Status: Wheelchair Discharge Destination: Home Transportation: Private Auto Accompanied By: daughter Schedule Follow-up Appointment: Yes Clinical Summary  of Care: Electronic Signature(s) Signed: 05/29/2022 5:20:51 PM By: Shawn Stall RN, BSN Entered By: Shawn Stall on 05/29/2022 14:40:08 -------------------------------------------------------------------------------- Lower Extremity Assessment Details Patient Name: Date of Service: Jamie [name]. 05/29/2022 1:30 PM Medical Record Number: [id number] Patient Account Number: [id number] Date of Birth/Sex: Treating RN: 16-Jun-1940 (82 y.o. F) Primary Care Tasnia Spegal: [name] Other Clinician: Referring Shawna Wearing: Treating Harsh Trulock/Extender: [name], [name] in Treatment: 1 Edema Assessment Assessed: [Left: No] [Right: No] Edema: [Left: Ye] [Right: s] Calf MYRAH, STRAWDERMAN ([id number]) [id number] Page 3 of 7 Left: Right: Point of Measurement: From Medial Instep 36 cm 37 cm Ankle Left: Right: Point of Measurement: From Medial Instep 23 cm  22.5 cm Vascular Assessment Pulses: Dorsalis Pedis Palpable: [Left:Yes] [Right:Yes] Electronic Signature(s) Signed: 05/29/2022 4:31:17 PM By: Thayer Dallas Entered By: Thayer Dallas on 05/29/2022 14:08:17 -------------------------------------------------------------------------------- Multi-Disciplinary Care Plan Details Patient Name: Date of Service: Jamie [name], [name] 05/29/2022 1:30 PM Medical Record Number: [id number] Patient Account Number: [id number] Date of Birth/Sex: Treating RN: October 29, 1940 (82 y.o. [name] Primary Care Ambry Dix: [name] Other Clinician: Referring Xaviera Flaten: Treating Armonie Mettler/Extender: [name], [name] in Treatment: 1 Active Inactive Venous Leg Ulcer Nursing Diagnoses: Actual venous Insuffiency (use after diagnosis is confirmed) Knowledge deficit related to disease process and management Goals: Patient will maintain optimal edema control Date Initiated: 05/22/2022 Target  Resolution Date: 06/26/2022 Goal Status: Active Patient/caregiver will verbalize understanding of disease process and disease management Date Initiated: 05/22/2022 Target Resolution Date: 06/19/2022 Goal Status: Active Interventions: Assess peripheral edema status every visit. Compression as ordered Provide education on venous insufficiency Notes: Wound/Skin Impairment Nursing Diagnoses: Impaired tissue integrity Knowledge deficit related to ulceration/compromised skin integrity Goals: Patient/caregiver will verbalize understanding of skin care regimen Date Initiated: 05/22/2022 Target Resolution Date: 06/26/2022 Goal Status: Active Ulcer/skin breakdown will have a volume reduction of 30% by week 4 Date Initiated: 05/22/2022 Target Resolution Date: 06/26/2022 Goal Status: Active Interventions: KATHALINA, OSTERMANN ([id number]) [id number] Page 4 of 7 Assess patient/caregiver ability to obtain necessary supplies Assess patient/caregiver ability to perform ulcer/skin care regimen upon admission and as needed Assess ulceration(s) every visit Provide education on ulcer and skin care Notes: Electronic Signature(s) Signed: 05/29/2022 5:20:51 PM By: Shawn Stall RN, BSN Entered By: Shawn Stall on 05/29/2022 14:33:28 -------------------------------------------------------------------------------- Pain Assessment Details Patient Name: Date of Service: Jamie [name], [name] 05/29/2022 1:30 PM Medical Record Number: [id number] Patient Account Number: [id number] Date of Birth/Sex: Treating RN: 1940/06/25 (82 y.o. F) Primary Care Rhonna Holster: [name] Other Clinician: Referring Tanashia Ciesla: Treating Kiyomi Pallo/Extender: [name], [name] in Treatment: 1 Active Problems Location of Pain Severity and Description of Pain Patient Has Paino Yes Site Locations Rate the pain. Current Pain Level: 10 Pain Management and  Medication Current Pain Management: Electronic Signature(s) Signed: 10/30/2022 2:24:26 PM By: Karl Ito Entered By: Karl Ito on 05/29/2022 13:45:14 -------------------------------------------------------------------------------- Patient/Caregiver Education Details Patient Name: Date of Service: Jamie [name] 2/21/2024andnbsp1:30 PM Medical Record Number: [id number] Patient Account Number: [id number] Date of Birth/Gender: Treating RN: [birth year] (82 y.o. [name] Primary Care Physician: [name] Other Clinician: Referring Physician: Treating Physician/Extender: [name], [place], [place] ([id number]) [id number] Page 5 of 7 Weeks in Treatment: 1 Education Assessment Education Provided To: Patient Education Topics Provided Wound/Skin Impairment: Handouts: Caring for Your Ulcer Methods: Explain/Verbal Responses: Reinforcements needed Electronic Signature(s) Signed: 05/29/2022 5:20:51 PM By: Shawn Stall RN, BSN Entered By: Shawn Stall on 05/29/2022 14:33:44 -------------------------------------------------------------------------------- Wound Assessment Details Patient Name: Date of Service: Jamie [name], [name] 05/29/2022 1:30 PM Medical Record Number: [id number] Patient Account Number: [id number] Date of Birth/Sex: Treating RN: 03-Feb-1941 (82 y.o. [name] Primary Care Doyne Ellinger: [name] Other Clinician: Referring Channa Hazelett: Treating Ciaira Natividad/Extender: [name], [name] in Treatment: 1 Wound Status Wound Number: 5 Primary Venous Leg Ulcer Etiology: Wound Location: Right, Lateral Lower Leg Wound Open Wounding Event: Trauma Status: Date Acquired: 04/17/2022 Comorbid Sleep Apnea, Hypertension, Peripheral Venous Disease, Weeks Of Treatment: 1 History: Osteoarthritis, Neuropathy Clustered Wound: No Photos Wound  Measurements Length: (cm) 8 Width: (cm) 5.1 Depth: (cm) 0.1 Area: (cm) 32.044 Volume: (cm) 3.204 % Reduction in Area: -  22.5% % Reduction in Volume: 59.2% Epithelialization: None Tunneling: No Undermining: No Wound Description Classification: Full Thickness Without Exposed Support Exudate Amount: Medium Exudate Type: Serosanguineous Exudate Color: red, brown Structures Foul Odor After Cleansing: No Slough/Fibrino Yes Wound Bed EVI, MCCOMB ([id number]) [phone].pdf Page 6 of 7 Granulation Amount: Small (1-33%) Exposed Structure Granulation Quality: Red Fascia Exposed: No Necrotic Amount: Large (67-100%) Fat Layer (Subcutaneous Tissue) Exposed: Yes Necrotic Quality: Adherent Slough Tendon Exposed: No Muscle Exposed: No Joint Exposed: No Bone Exposed: No Periwound Skin Texture Texture Color No Abnormalities Noted: No No Abnormalities Noted: No Callus: No Atrophie Blanche: No Crepitus: No Cyanosis: No Excoriation: No Ecchymosis: No Induration: No Erythema: No Rash: No Hemosiderin Staining: No Scarring: No Mottled: No Pallor: No Moisture Rubor: No No Abnormalities Noted: No Dry / Scaly: No Maceration: No Electronic Signature(s) Signed: 05/29/2022 4:31:17 PM By: Thayer Dallas Signed: 05/29/2022 5:20:51 PM By: Shawn Stall RN, BSN Entered By: Thayer Dallas on 05/29/2022 14:04:23 -------------------------------------------------------------------------------- Wound Assessment Details Patient Name: Date of Service: Jamie [name], [name] 05/29/2022 1:30 PM Medical Record Number: [id number] Patient Account Number: [id number] Date of Birth/Sex: Treating RN: [birth year] (82 y.o. Debara Pickett, Yvonne Kendall Primary Care Teaira Croft: [name] Other Clinician: Referring Olivene Cookston: Treating Abimelec Grochowski/Extender: [name], [name] in Treatment: 1 Wound Status Wound Number: 6 Primary Venous Leg  Ulcer Etiology: Wound Location: Left, Anterior Lower Leg Wound Open Wounding Event: Shear/Friction Status: Date Acquired: 05/22/2022 Comorbid Sleep Apnea, Hypertension, Peripheral Venous Disease, Weeks Of Treatment: 0 History: Osteoarthritis, Neuropathy Clustered Wound: No Photos Wound Measurements Length: (cm) 0.6 Width: (cm) 0.7 Depth: (cm) 0.1 Area: (cm) 0.33 Volume: (cm) 0.033 % Reduction in Area: % Reduction in Volume: Tunneling: No Undermining: No Wound Description ARRIYANA, RODELL ([id number]) Classification: Partial Thickness Exudate Amount: Medium Exudate Type: Serosanguineous Exudate Color: red, brown [phone].pdf Page 7 of 7 Foul Odor After Cleansing: No Slough/Fibrino Yes Wound Bed Granulation Amount: Large (67-100%) Granulation Quality: Red Necrotic Amount: Small (1-33%) Necrotic Quality: Eschar Periwound Skin Texture Texture Color No Abnormalities Noted: No No Abnormalities Noted: No Moisture No Abnormalities Noted: No Electronic Signature(s) Signed: 05/29/2022 4:31:17 PM By: Thayer Dallas Signed: 05/29/2022 5:20:51 PM By: Shawn Stall RN, BSN Entered By: Thayer Dallas on 05/29/2022 14:05:00 -------------------------------------------------------------------------------- Vitals Details Patient Name: Date of Service: Jamie Brink, MA [name] 05/29/2022 1:30 PM Medical Record Number: [id number] Patient Account Number: [id number] Date of Birth/Sex: Treating RN: [birth year] (82 y.o. F) Primary Care Blanka Rockholt: [name] Other Clinician: Referring Januel Doolan: Treating Laurann Mcmorris/Extender: [name], [name] in Treatment: 1 Vital Signs Time Taken: 13:44 Temperature (F): 98.0 Height (in): 62 Pulse (bpm): 58 Weight (lbs): 171 Respiratory Rate (breaths/min): 18 Body Mass Index (BMI): 31.3 Blood Pressure (mmHg): 107/63 Reference Range: 80 - 120 mg / dl Electronic Signature(s) Signed:  10/30/2022 2:24:26 PM By: Karl Ito Entered By: Karl Ito on 05/29/2022 13:45:06

## 2022-10-31 NOTE — Progress Notes (Signed)
Jamie, [name] ([id number]) [id number] Page 1 of 9 Visit Report for 10/30/2022 HPI Details Patient Name: Date of Service: Jamie [name], [name]. 10/30/2022 10:15 A M Medical Record Number: [id number] Patient Account Number: [id number] Date of Birth/Sex: Treating RN: 02-02-1941 (82 y.o. F) Primary Care Provider: [name] Other Clinician: Referring Provider: Treating Provider/Extender: Vivia Budge in Treatment: 23 History of Present Illness HPI Description: ADMISSION 06/30/2020 [name] is an 82 year old woman who lives in [place]. She is here with her niece for review of wounds on the left medial lower leg and ankle. These have apparently been present for over a year and she followed with Dr. Olegario Messier at the [facility] in [place] for quite a period of time although it looks as though there was a initial consult wound from Dr. Marcha Solders on February 18 presumably there was therefore hiatus. At that point the wounds were described as being there for 3 months. She also tells me she was at the wound care center in [place] for a period of time with this. 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 She had venous studies that were negative for DVT ABIs on the right were 1.01 on the left 1.06. She has . had previous applications of puraply, compression which she does not tolerate very well. She has had several rounds of oral antibiotic therapy. She complains of unrelenting pain and she is seeing Dr. Reece Agar V of pain management apparently was on oxycodone but that did not help. She also had a skin biopsy done by Dr. Olegario Messier although we do not have that result. She does not appear to have an arterial issue. I am not completely clear what she has been putting on the wounds lately. 3/31; this patient has a particularly nasty set of wounds on the left medial ankle in the  middle of what looks to be hemosiderin deposition secondary to chronic venous insufficiency. She has a lot of pain followed by pain management. Dr. Marcha Solders apparently did a biopsy of something on the left leg last fall what I would like to get this result. She has a history of MRSA treatment. I do not believe she had reflux studies but she did have DVT rule out studies. Previous ABIs have not suggested arterial insufficiency 4/7; difficult wounds on the left medial ankle probably chronic venous insufficiency. With considerable effort on behalf of our case manager we were able to finally to speak to somebody at the hospital in [place] who indicated that no biopsy of this area have been done even though the patient describes this in some detail and is even able to point out where she thinks the biopsy was done. We have been using Sorbact. The PCR culture I did showed polymicrobial identification with Pseudomonas, staph aureus, Peptostreptococcus. All of this and low titers. Resistance genes identified were MRSA, staph virulence gene and tetracycline. We are going to send this to [facility] for a topical antibiotic which is something that we have had good success with recently in large venous ulcers with a lot of purulent drainage. There would not be an easy oral alternative here possibly line escalated and ciprofloxacin if we need to use systemic antibiotic 4/15; difficult area on the left medial ankle. Most likely chronic venous insufficiency. I think she will probably need venous reflux study I think she had DVT rule outs but not venous reflux studies. We have not yet obtained the topical antibiotics. She has  home health changing the dressing we have been using Sorbact for adherent fibrinous debris on the surface. Very difficult to remove 4/22; patient presents for 1 week follow-up. She has been using sore back under compression wraps and these are changed 3 times a week with home health. She also had  Keystone antibiotics sent to her house and brought them in today. She has no complaints or issues today. 5/2; patient is here for follow-up. She has been using Sorbact under compression. Very painful wound. She has been using Keystone antibiotics. Not much improvement although the more medial part of the wound has cleaned up nicely and the larger part of the wound about 50% slough covered. Part of the issue here is that she had stays at both [facility] wound care center, [facility] wound care center and now [place]. Not sure if she has had venous reflux studies. As far as we are able to tell she did not have a biopsy. My notes state that she did not have venous reflux studies just DVT rule outs. 5/16; patient goes for venous reflux studies this afternoon. She says that wound was biopsied which sounds like punch biopsies by Dr. Lynden Ang we do not have these results. We are using Sorbact. Very difficult wound to debride 5/23; patient presents for 1 week follow-up. She reports tolerating the wraps well with sorbact underneath. She had ABIs and venous reflux studies done. She has no issues or complaints today. She denies acute signs of infection. 6/6; I have reviewed the patient's vascular studies. Wounds are on the left medial and posterior calf. She had significant reflux in the greater saphenous vein in the in the mid thigh, distal thigh knee and the small saphenous vein in the popliteal fossa. The vein diameters do not look too impressive though. She is going to see the vascular surgeon on Wednesday. She also had venous reflux in the right common femoral vein. She did not have any evidence of a DVT or SVT I am wondering whether there is an ablation procedure that would benefit her in the greater saphenous vein on the left. . She tells [place] that home health put the dressing on too tight and she took off 1 layer. The swelling in her left leg is a little worse as a result of this. Not much change in the wound  measurements so the surface of the wound looks better Her arterial studies showed an ABI on the right of 1.14 with a triphasic waveform and a great toe pressure of 0.89. On the left her ABIs were noncompressible at 1.34 but with triphasic waveforms and a TBI of 0.97. Her greater toe pressure was 122. There was no evidence of significant bilateral arterial disease 6/20 patient went to see Dr. Durwin Nora. He did not think she had significant arterial disease. In terms of her venous duplex on the right side there was no evidence of a DVT or SVT there was deep venous reflux involving the common femoral vein no superficial vein reflux on the left side there was no DVT or SVT there was no deep vein graft reflux there was some reflux in the greater saphenous vein from the mid thigh to the knee but the vein here was not dilated. He thought these were venous wounds he prescribed a compression pump but I am not sure who we ordered it from The patient has been approved for Apligraf. Still using silver collagen this week 7/5; Apligraf #1 7/19 Apligraf #2. Decent improvement in the condition of  the wound bed. Epithelialization distal 8/2 Apligraf #3. No issues or complaints. Denies signs of infection. 8/17 Apligraf #4. No issues or concerns. Some complaints of pruritus and the rash. Our intake nurse brought up the fact that she had previously indicated possible cotton layer sensitivity we will therefore use kerlix in the bottom layer of the compression 8/30; the patient comes in with the area on her left medial leg just about healed. There is a superficial area more towards the tibia and a smaller open area LINDZEE, GOUGE ([id number]) [id number] Page 2 of 9 distally everything else is epithelialized. I do not think she requires another Apligraf 9/13; left medial leg is healed. 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 Her  edema control is good Readmisstion: 05-22-2022 upon evaluation today patient presents for initial inspection here in our clinic concerning issues that she has been having with a wound of the right lateral lower extremity. This is an area of a previous skin graft she tells me. With that being said she unfortunately had a scrape on this that occurred around 10 January. Since that time she has noted that this has just continued to get bigger and bigger in her niece who is present with her today actually states that 2 weeks ago when she saw that it was significantly smaller than what she sees currently. Obviously this is of utmost concern as we do not want this to continue to get larger when arrested and get moving in the right direction. Fortunately there does not appear to be any signs of systemic infection though locally I think we probably do have some infection present. I would obtain a culture to see what we have going on here and then we will subsequently see where things go going forward. Fortunately I think that she is in the right place at this point being at the wound center we can definitely do something to try to get this moving in the right direction. Patient does have a history of chronic venous insufficiency as well as coronary artery disease. In the past she has done well with compression wraps on the start with a 3 layer wrap I think she is probably can end up going to a 4-layer at some point but we will see how things do over the next week. She has been using wound cleanser along with she tells me a zinc and topical but again I am not sure exactly what that was. 05-29-2022 upon evaluation today patient appears to be doing well currently in regard to her wound. This actually is showing signs of improvement I am happy in that regard unfortunately her left leg has an area on the shin that has opened. This is the region where one of the areas at least we have previously taken care of.  Nonetheless this is small hoping we get it under control before things worsen significantly. 06-05-2022 upon evaluation today patient appears to be doing well currently in regard to her wounds. The actually seem to be fairly clean she is having still quite a bit of problem with pain at this point she would like to not have debridement today for all possible. With that being said I do believe that we are making some good progress I think the [facility] is doing a decent job here. 3/6; patient has had 3/6; patient with known severe chronic venous insufficiency. She apparently had a traumatic wound at home and has a  reasonably substantial wound on the right lateral lower leg and a smaller one on the left anterior lower leg as well. We have been using Hydrofera Blue and Unna boots 3/13; patient presents for follow-up. We have been using Hydrofera Blue under Coflex to the legs bilaterally. She has no issues or complaints today. 06-26-2022 upon evaluation today patient appears to be doing well currently in regard to her wound. This is measuring a little bit larger however. Fortunately I do not see any signs of infection this is on the right side. Fortunately the left side is actually completely healed which is great news. 07-03-2022 patient's wound unfortunately is continuing to show signs of being worse. Her compression which was the Tubigrip that I thought should be able to pull up actually slipped down and she was not able to pull that back up. With that being said that means that unfortunately her wound has continued to deteriorate since I last saw her. This is definitely not the direction that we are looking for. I discussed with her that I do believe we need to go ahead and see about getting her started on antibiotics and I subsequently would also like to go ahead and see about getting things moving forward with regard to a wound culture and making a change up her dressings as well but this can be  changed more frequently than just once a week. 07-10-2022 upon evaluation today patient actually appears to be doing much better. I do believe that the antibiotics have been beneficial for her. Fortunately I do not see any signs of active infection locally nor systemically which is great news. No fevers, chills, nausea, vomiting, or diarrhea. 07-17-2022 upon evaluation today patient appears to be doing well currently in regard to her wound which is actually showing signs of improvement this is slow but nonetheless prevalent that we are seeing good improvement here. Fortunately I do not see any signs of active infection locally nor systemically which is great news. 07-24-2022 upon evaluation today patient appears to be doing well currently in regard to her wound although the wrap that we had on has been slipping down and she really does not have anybody to help her with this at home health is not going to be coming out we could not get anybody that would actually be able to do the dressing changes. Fortunately I do not see any signs of active infection locally nor systemically which is great news. 08-07-22 poorly in regard to her leg compared to what it was previous. Fortunately there does not appear to be any signs of active infection locally nor systemically which is great news. No fevers, chills, nausea, vomiting, or diarrhea. With that being said it does appear that the patient may have some cellulitis in the leg which is not good. 08-14-2022 upon evaluation today patient appears to be doing somewhat better in regard to her wounds she still is having quite a bit of pain we are still not where we want to be as far as healing is concerned completely. Fortunately I do not see any evidence of active infection locally nor systemically which is great news and I am very pleased in that regard. 08-21-23 upon evaluation today patient appears to be doing poorly currently in regard to her wound. She has been  tolerating the dressing changes without complication. Fortunately there does not appear to be any signs of active infection locally nor systemically at this time. 08-28-2022 upon evaluation today patient appears to be doing  poorly in regard to her wound this was not wrapped appropriately home health did not go up high enough on the wrap. This has caused some issues and I discussed that with the patient today. I do believe that she is going to require aggressive wrapping and treatment which she is getting the [facility] topical antibiotics today they can start using that at the next wrap on Friday. In the meantime they need to make sure that the rapid and appropriately we wrote very specific orders today. 09-04-2022 upon evaluation today patient appears to be doing well currently in regard to her wound which I think is making progress is still hurting her quite significantly. Fortunately I do not see any signs of active infection locally or systemically which is great news. No fevers, chills, nausea, vomiting, or diarrhea. 09-11-2022 upon evaluation today patient's wound actually is showing signs of being a little bit smaller and looking a little bit better she still has a lot going on here however. Fortunately I do not see any evidence of active infection Worsening locally nor systemically which is great news. With that being said worsening still continue to use the topical Keystone antibiotics. 09-18-2022 upon evaluation today patient actually showing some signs of improvement. I am actually very pleased with where we stand compared to where we have been. I think that she is making good headway here. She is still having quite a bit of pain but it seems to be lessening compared to previous. 09-25-2022 upon evaluation patient's wound actually showing signs slowly but surely of improvement. Fortunately I do not see any signs of active infection at this time systemically and locally I feel like this is  dramatically improved. She is doing well with the [facility] topical antibiotics. 10-02-2022 upon evaluation today patient appears to be doing better in regard to her wound overall. I am very pleased with where things stand from a visual standpoint I do not see any signs of active infection and in general I think that we are moving in the right direction here. 10-09-2022 upon evaluation today patient appears to be doing well currently in regard to her wound. She has been tolerating the dressing changes without complication. Fortunately I do not see any signs of active infection at this time which is great news. 10-16-2022 upon evaluation today patient continues to have quite a bit of pain in regard to her right leg. We have been attempting to debride little by little as we could but again was pretty limited by the fact that she is having significant discomfort. We do not actually have any signs of active infection going on at this point that the [facility] topical antibiotics have been helping quite readily. I been attempting to do as much debridement as I can but if she were to have pain medication this would actually help and in the past when she did it was much more tolerable for her as far as taking the edge off. Right now she has been without as she is in transition from her previous pain management physician to someone new to manage this. AHMYAH, GIDLEY ([id number]) [id number] Page 3 of 9 10-23-2022 upon evaluation patient appears to be doing excellent in regard to her wound. She has been tolerating the dressing changes without complication. Fortunately I do not see any evidence of active infection locally nor systemically which is great news. No fevers, chills, nausea, vomiting, or diarrhea. 7/24; patient with a wound secondary to chronic venous insufficiency on the right lateral  lower leg. We have been using Keystone antibiotic Hydrofera Blue under kerlix Coban. She  complains of a lot of pain after last week's debridement otherwise things appear to be improved per discussion with our intake nurse. Electronic Signature(s) Signed: 10/30/2022 4:12:50 PM By: [name] MD Entered By: [name] on 10/30/2022 11:06:19 -------------------------------------------------------------------------------- Physical Exam Details Patient Name: Date of Service: Jamie [name], Christean Grief. 10/30/2022 10:15 A M Medical Record Number: [id number] Patient Account Number: [id number] Date of Birth/Sex: Treating RN: Nov 08, 1940 (82 y.o. F) Primary Care Provider: [name] Other Clinician: Referring Provider: Treating Provider/Extender: Vivia Budge in Treatment: 23 Constitutional Patient is hypertensive.. Pulse regular and within target range for patient.Marland Kitchen Respirations regular, non-labored and within target range.. Temperature is normal and within the target range for the patient.Marland Kitchen Appears in no distress. Cardiovascular Pedal pulses on the right are intact. Notes Wound exam; lateral part of the right lower leg. 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 Above the mid part of her calf there is nonpitting edema up until the knee. The wound itself looks satisfactory vigorously washed off with wound cleanser and gauze. No evidence of infection. No mechanical debridement was done Electronic Signature(s) Signed: 10/30/2022 4:12:50 PM By: [name] MD Entered By: [name] on 10/30/2022 11:09:34 -------------------------------------------------------------------------------- Physician Orders Details Patient Name: Date of Service: Jamie [name], [name] 10/30/2022 10:15 A M Medical Record Number: [id number] Patient Account Number: [id number] Date of Birth/Sex: Treating RN: [birth year] (82 y.o. 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 [facility] Wednesday Room 8 weekly 11/06/2022 (already scheduled) Return A ppointment in 2 weeks. - 11/13/2022 [place] Wednesday (already scheduled) Return A ALEISHA, PAONE ([id number]) [id number] Page 4 of 9 Other: - home health to wash right leg and foot with soap (dial soap) and water with dressing changes. Patient to have soap and towels next to her when Home Health arrives at the house. *****[facility] compounding topical antibiotics. FOLLOW THE PHARMACY INSTRUCTIONS ON HOW TO MIX, and apply under the hydrofera blue.****** ****PLEASE APPLY ZINC OXIDE as needed AROUND THE PERIWOUND DUE TO MACERATION. ****Patient to bring in the [facility] topical antibiotics to each wound care appt as well.***** *****HOME HEALTH TO PLEASE WRAP FROM BASE OF TOES TO JUST BELOW KNEE. please appy first layer of unna boot at the bast of the toes and just below the knee to help hold the compression wrap in place.**** Anesthetic Wound #5 Right,Lateral Lower Leg (In clinic) Topical Lidocaine 5% applied to wound bed (In clinic) Topical Lidocaine 4% applied to wound bed Bathing/ Shower/ Hygiene May shower and wash wound with soap and water. - with dressing changes. Edema Control - Lymphedema / SCD / Other Right Lower Extremity Elevate legs to the level of the heart or  above for 30 minutes daily and/or when sitting for 3-4 times a day throughout the day. Avoid standing for long periods of time. Exercise regularly Home Health Wound #5 Right,Lateral Lower  Leg No change in wound care orders this week; continue Home Health for wound care. May utilize formulary equivalent dressing for wound treatment orders unless otherwise specified. Other Home Health Orders/Instructions: - [facility] [place], [place] fax # [phone]. Wound Treatment Wound #5 - Lower Leg Wound Laterality: Right, Lateral Cleanser: Soap and Water 3 x Per Week/30 Days Discharge Instructions: May shower and wash wound with dial antibacterial soap and water prior to dressing change. Peri-Wound Care: Zinc Oxide Ointment 30g tube 3 x Per Week/30 Days Discharge Instructions: Apply Zinc Oxide to periwound with each dressing change TO ANY MACERATED SKIN. Peri-Wound Care: Sween Lotion (Moisturizing lotion) 3 x Per Week/30 Days Discharge Instructions: Apply moisturizing lotion as directed Topical: compounding topical antibiotics 3 x Per Week/30 Days Discharge Instructions: apply directly to wound bed under the hydrofera blue. Home Health start. MIX ACCORDING TO THE PHARMACY INSTRUCTIONS. Prim Dressing: Hydrofera Blue Ready Transfer Foam, 4x5 (in/in) (Generic) 3 x Per Week/30 Days ary Discharge Instructions: Apply over the topical antibiotics wound bed as instructed Secondary Dressing: ABD Pad, 8x10 3 x Per Week/30 Days Discharge Instructions: Apply over primary dressing as directed. Secondary Dressing: Woven Gauze Sponge, Non-Sterile 4x4 in 3 x Per Week/30 Days Discharge Instructions: Apply over primary dressing as directed. Secondary Dressing: Zetuvit Plus 4x8 in 3 x Per Week/30 Days Discharge Instructions: Or double ABD pad Apply over primary dressing as directed. Compression Wrap: Kerlix Roll 4.5x3.1 (in/yd) 3 x Per Week/30 Days Discharge Instructions: Apply Kerlix and Coban  compression as directed. Compression Wrap: Coban Self-Adherent Wrap 4x5 (in/yd) 3 x Per Week/30 Days Discharge Instructions: Apply over Kerlix as directed. Compression Wrap: unna boot FIRST LAYER **** SEE INSTRUCTIONS 3 x Per Week/30 Days Discharge Instructions: APPLY FIRST LAYER UNNA BOOT AT BASES OF TOES AND JUST BELOW THE KNEE TO HOLD COMPRESSION WRAP IN PLACE. Electronic Signature(s) Signed: 10/30/2022 4:12:50 PM By: [name] MD Signed: 10/30/2022 6:08:49 PM By: Shawn Stall RN, BSN Entered By: Shawn Stall on 10/30/2022 10:59:02 [name] ([id number]) [id number] Page 5 of 9 -------------------------------------------------------------------------------- Problem List Details Patient Name: Date of Service: Jamie [name], [name]. 10/30/2022 10:15 A M Medical Record Number: [id number] Patient Account Number: [id number] Date of Birth/Sex: Treating RN: 1940-05-01 (82 y.o. 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 05/22/2022 No Yes lower extremity L97.812 Non-pressure chronic ulcer of other part of right lower leg with fat layer 05/22/2022 No Yes exposed L97.822 Non-pressure chronic ulcer of other part of left lower leg with fat layer exposed2/21/2024 No Yes I25.10 Atherosclerotic heart disease of native coronary artery without angina pectoris 05/22/2022 No Yes Inactive Problems Resolved Problems Electronic Signature(s) Signed: 10/30/2022 4:12:50 PM By: [name] MD Entered By: [name] on 10/30/2022 11:04:25 -------------------------------------------------------------------------------- Progress Note  Details Patient Name: Date of Service: Jamie [name], [name] 10/30/2022 10:15 A M Medical Record Number: [id number] Patient Account Number: [id number] Date of Birth/Sex: Treating RN: 03-Feb-1941 (82 y.o. F) Primary Care Provider: [name] Other Clinician: Referring Provider: Treating Provider/Extender: Vivia Budge in Treatment: 23 Subjective History of Present Illness (HPI) ADMISSION 06/30/2020 Mrs. Deshmukh is an 82 year old woman who lives in [place]. She is here with her niece for review of wounds on the left medial lower leg and ankle. These have apparently been  present for over a year and she followed with Dr. Olegario Messier at the [facility] in [place] for quite a period of time although it looks as though there was a initial consult wound from Dr. Marcha Solders on February 18 presumably there was therefore hiatus. At that point the wounds were described as being there for 3 months. She also tells me she was at the wound care center in [place] for a period of time with this. 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 She had venous studies that were negative for DVT ABIs on the right were 1.01 on the left 1.06. She has . ASHLEI, CHINCHILLA ([id number]) [id number] Page 6 of 9 had previous applications of puraply, compression which she does not tolerate very well. She has had several rounds of oral antibiotic therapy. She complains of unrelenting pain and she is seeing Dr. Reece Agar V of pain management apparently was on oxycodone but that did not help. She also had a skin biopsy done by Dr. Olegario Messier although we do not have that result. She does not appear to have an arterial issue. I am not completely clear what she has been putting on the wounds lately. 3/31; this patient has a particularly nasty set of wounds on the left medial ankle in the middle of what looks to be  hemosiderin deposition secondary to chronic venous insufficiency. She has a lot of pain followed by pain management. Dr. Marcha Solders apparently did a biopsy of something on the left leg last fall what I would like to get this result. She has a history of MRSA treatment. I do not believe she had reflux studies but she did have DVT rule out studies. Previous ABIs have not suggested arterial insufficiency 4/7; difficult wounds on the left medial ankle probably chronic venous insufficiency. With considerable effort on behalf of our case manager we were able to finally to speak to somebody at the hospital in [place] who indicated that no biopsy of this area have been done even though the patient describes this in some detail and is even able to point out where she thinks the biopsy was done. We have been using Sorbact. The PCR culture I did showed polymicrobial identification with Pseudomonas, staph aureus, Peptostreptococcus. All of this and low titers. Resistance genes identified were MRSA, staph virulence gene and tetracycline. We are going to send this to [facility] for a topical antibiotic which is something that we have had good success with recently in large venous ulcers with a lot of purulent drainage. There would not be an easy oral alternative here possibly line escalated and ciprofloxacin if we need to use systemic antibiotic 4/15; difficult area on the left medial ankle. Most likely chronic venous insufficiency. I think she will probably need venous reflux study I think she had DVT rule outs but not venous reflux studies. We have not yet obtained the topical antibiotics. She has home health changing the dressing we have been using Sorbact for adherent fibrinous debris on the surface. Very difficult to remove 4/22; patient presents for 1 week follow-up. She has been using sore back under compression wraps and these are changed 3 times a week with home health. She also had Keystone antibiotics sent to her  house and brought them in today. She has no complaints or issues today. 5/2; patient is here for follow-up. She has been using Sorbact under compression. Very painful wound. She has been using Keystone antibiotics. Not much improvement although the  more medial part of the wound has cleaned up nicely and the larger part of the wound about 50% slough covered. Part of the issue here is that she had stays at both [facility] wound care center, [facility] wound care center and now [place]. Not sure if she has had venous reflux studies. As far as we are able to tell she did not have a biopsy. My notes state that she did not have venous reflux studies just DVT rule outs. 5/16; patient goes for venous reflux studies this afternoon. She says that wound was biopsied which sounds like punch biopsies by Dr. Lynden Ang we do not have these results. We are using Sorbact. Very difficult wound to debride 5/23; patient presents for 1 week follow-up. She reports tolerating the wraps well with sorbact underneath. She had ABIs and venous reflux studies done. She has no issues or complaints today. She denies acute signs of infection. 6/6; I have reviewed the patient's vascular studies. Wounds are on the left medial and posterior calf. She had significant reflux in the greater saphenous vein in the in the mid thigh, distal thigh knee and the small saphenous vein in the popliteal fossa. The vein diameters do not look too impressive though. She is going to see the vascular surgeon on Wednesday. She also had venous reflux in the right common femoral vein. She did not have any evidence of a DVT or SVT I am wondering whether there is an ablation procedure that would benefit her in the greater saphenous vein on the left. . She tells [place] that home health put the dressing on too tight and she took off 1 layer. The swelling in her left leg is a little worse as a result of this. Not much change in the wound measurements so the surface of the wound  looks better Her arterial studies showed an ABI on the right of 1.14 with a triphasic waveform and a great toe pressure of 0.89. On the left her ABIs were noncompressible at 1.34 but with triphasic waveforms and a TBI of 0.97. Her greater toe pressure was 122. There was no evidence of significant bilateral arterial disease 6/20 patient went to see Dr. Durwin Nora. He did not think she had significant arterial disease. In terms of her venous duplex on the right side there was no evidence of a DVT or SVT there was deep venous reflux involving the common femoral vein no superficial vein reflux on the left side there was no DVT or SVT there was no deep vein graft reflux there was some reflux in the greater saphenous vein from the mid thigh to the knee but the vein here was not dilated. He thought these were venous wounds he prescribed a compression pump but I am not sure who we ordered it from The patient has been approved for Apligraf. Still using silver collagen this week 7/5; Apligraf #1 7/19 Apligraf #2. Decent improvement in the condition of the wound bed. Epithelialization distal 8/2 Apligraf #3. No issues or complaints. Denies signs of infection. 8/17 Apligraf #4. No issues or concerns. Some complaints of pruritus and the rash. Our intake nurse brought up the fact that she had previously indicated possible cotton layer sensitivity we will therefore use kerlix in the bottom layer of the compression 8/30; the patient comes in with the area on her left medial leg just about healed. There is a superficial area more towards the tibia and a smaller open area distally everything else is epithelialized. I do not think  she requires another Apligraf 9/13; left medial leg is healed. 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 Her edema control is good Readmisstion: 05-22-2022 upon evaluation today patient presents for initial inspection here in our clinic  concerning issues that she has been having with a wound of the right lateral lower extremity. This is an area of a previous skin graft she tells me. With that being said she unfortunately had a scrape on this that occurred around 10 January. Since that time she has noted that this has just continued to get bigger and bigger in her niece who is present with her today actually states that 2 weeks ago when she saw that it was significantly smaller than what she sees currently. Obviously this is of utmost concern as we do not want this to continue to get larger when arrested and get moving in the right direction. Fortunately there does not appear to be any signs of systemic infection though locally I think we probably do have some infection present. I would obtain a culture to see what we have going on here and then we will subsequently see where things go going forward. Fortunately I think that she is in the right place at this point being at the wound center we can definitely do something to try to get this moving in the right direction. Patient does have a history of chronic venous insufficiency as well as coronary artery disease. In the past she has done well with compression wraps on the start with a 3 layer wrap I think she is probably can end up going to a 4-layer at some point but we will see how things do over the next week. She has been using wound cleanser along with she tells me a zinc and topical but again I am not sure exactly what that was. 05-29-2022 upon evaluation today patient appears to be doing well currently in regard to her wound. This actually is showing signs of improvement I am happy in that regard unfortunately her left leg has an area on the shin that has opened. This is the region where one of the areas at least we have previously taken care of. Nonetheless this is small hoping we get it under control before things worsen significantly. 06-05-2022 upon evaluation today patient  appears to be doing well currently in regard to her wounds. The actually seem to be fairly clean she is having still quite a bit of problem with pain at this point she would like to not have debridement today for all possible. With that being said I do believe that we are making some good progress I think the [facility] is doing a decent job here. 3/6; patient has had 3/6; patient with known severe chronic venous insufficiency. She apparently had a traumatic wound at home and has a reasonably substantial wound on the right lateral lower leg and a smaller one on the left anterior lower leg as well. We have been using Hydrofera Blue and Unna boots 3/13; patient presents for follow-up. We have been using Hydrofera Blue under Coflex to the legs bilaterally. She has no issues or complaints today. 06-26-2022 upon evaluation today patient appears to be doing well currently in regard to her wound. This is measuring a little bit larger however. Fortunately I do not see any signs of infection this is on the right side. Fortunately the left side is actually completely healed which is great news. 07-03-2022  patient's wound unfortunately is continuing to show signs of being worse. Her compression which was the Tubigrip that I thought should be able to TIANN, SAHA ([id number]) [id number] Page 7 of 9 pull up actually slipped down and she was not able to pull that back up. With that being said that means that unfortunately her wound has continued to deteriorate since I last saw her. This is definitely not the direction that we are looking for. I discussed with her that I do believe we need to go ahead and see about getting her started on antibiotics and I subsequently would also like to go ahead and see about getting things moving forward with regard to a wound culture and making a change up her dressings as well but this can be changed more frequently than just once a  week. 07-10-2022 upon evaluation today patient actually appears to be doing much better. I do believe that the antibiotics have been beneficial for her. Fortunately I do not see any signs of active infection locally nor systemically which is great news. No fevers, chills, nausea, vomiting, or diarrhea. 07-17-2022 upon evaluation today patient appears to be doing well currently in regard to her wound which is actually showing signs of improvement this is slow but nonetheless prevalent that we are seeing good improvement here. Fortunately I do not see any signs of active infection locally nor systemically which is great news. 07-24-2022 upon evaluation today patient appears to be doing well currently in regard to her wound although the wrap that we had on has been slipping down and she really does not have anybody to help her with this at home health is not going to be coming out we could not get anybody that would actually be able to do the dressing changes. Fortunately I do not see any signs of active infection locally nor systemically which is great news. 08-07-22 poorly in regard to her leg compared to what it was previous. Fortunately there does not appear to be any signs of active infection locally nor systemically which is great news. No fevers, chills, nausea, vomiting, or diarrhea. With that being said it does appear that the patient may have some cellulitis in the leg which is not good. 08-14-2022 upon evaluation today patient appears to be doing somewhat better in regard to her wounds she still is having quite a bit of pain we are still not where we want to be as far as healing is concerned completely. Fortunately I do not see any evidence of active infection locally nor systemically which is great news and I am very pleased in that regard. 08-21-23 upon evaluation today patient appears to be doing poorly currently in regard to her wound. She has been tolerating the dressing changes  without complication. Fortunately there does not appear to be any signs of active infection locally nor systemically at this time. 08-28-2022 upon evaluation today patient appears to be doing poorly in regard to her wound this was not wrapped appropriately home health did not go up high enough on the wrap. This has caused some issues and I discussed that with the patient today. I do believe that she is going to require aggressive wrapping and treatment which she is getting the [facility] topical antibiotics today they can start using that at the next wrap on Friday. In the meantime they need to make sure that the rapid and appropriately we wrote very specific orders today. 09-04-2022 upon evaluation today patient appears to be doing well  currently in regard to her wound which I think is making progress is still hurting her quite significantly. Fortunately I do not see any signs of active infection locally or systemically which is great news. No fevers, chills, nausea, vomiting, or diarrhea. 09-11-2022 upon evaluation today patient's wound actually is showing signs of being a little bit smaller and looking a little bit better she still has a lot going on here however. Fortunately I do not see any evidence of active infection Worsening locally nor systemically which is great news. With that being said worsening still continue to use the topical Keystone antibiotics. 09-18-2022 upon evaluation today patient actually showing some signs of improvement. I am actually very pleased with where we stand compared to where we have been. I think that she is making good headway here. She is still having quite a bit of pain but it seems to be lessening compared to previous. 09-25-2022 upon evaluation patient's wound actually showing signs slowly but surely of improvement. Fortunately I do not see any signs of active infection at this time systemically and locally I feel like this is dramatically improved. She is doing well  with the [facility] topical antibiotics. 10-02-2022 upon evaluation today patient appears to be doing better in regard to her wound overall. I am very pleased with where things stand from a visual standpoint I do not see any signs of active infection and in general I think that we are moving in the right direction here. 10-09-2022 upon evaluation today patient appears to be doing well currently in regard to her wound. She has been tolerating the dressing changes without complication. Fortunately I do not see any signs of active infection at this time which is great news. 10-16-2022 upon evaluation today patient continues to have quite a bit of pain in regard to her right leg. We have been attempting to debride little by little as we could but again was pretty limited by the fact that she is having significant discomfort. We do not actually have any signs of active infection going on at this point that the [facility] topical antibiotics have been helping quite readily. I been attempting to do as much debridement as I can but if she were to have pain medication this would actually help and in the past when she did it was much more tolerable for her as far as taking the edge off. Right now she has been without as she is in transition from her previous pain management physician to someone new to manage this. 10-23-2022 upon evaluation patient appears to be doing excellent in regard to her wound. She has been tolerating the dressing changes without complication. Fortunately I do not see any evidence of active infection locally nor systemically which is great news. No fevers, chills, nausea, vomiting, or diarrhea. 7/24; patient with a wound secondary to chronic venous insufficiency on the right lateral lower leg. We have been using Keystone antibiotic Hydrofera Blue under kerlix Coban. She complains of a lot of pain after last week's debridement otherwise things appear to be improved per discussion with our intake  nurse. Objective Constitutional Patient is hypertensive.. Pulse regular and within target range for patient.Marland Kitchen Respirations regular, non-labored and within target range.. Temperature is normal and within the target range for the patient.Marland Kitchen Appears in no distress. Vitals Time Taken: 10:19 AM, Height: 62 in, Weight: 171 lbs, BMI: 31.3, Temperature: 97.7 F, Pulse: 54 bpm, Respiratory Rate: 18 breaths/min, Blood Pressure: 156/92 mmHg. Cardiovascular Pedal pulses on the right are  intact. General Notes: Wound exam; lateral part of the right lower leg. 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 Above the mid part of her calf there is nonpitting edema up until the knee. The wound itself looks satisfactory vigorously washed off with wound cleanser and gauze. No evidence of infection. No mechanical debridement was done [facility], Skin) ENCARNACION, BOLE ([id number]) [id number] Page 8 of 9 Wound #5 status is Open. Original cause of wound was Trauma. The date acquired was: 04/17/2022. The wound has been in treatment 23 weeks. The wound is located on the Right,Lateral Lower Leg. The wound measures 12.5cm length x 6cm width x 0.2cm depth; 58.905cm^2 area and 11.781cm^3 volume. There is Fat Layer (Subcutaneous Tissue) exposed. There is no tunneling or undermining noted. There is a medium amount of serosanguineous drainage noted. The wound margin is distinct with the outline attached to the wound base. There is medium (34-66%) red granulation within the wound bed. There is a medium (34-66%) amount of necrotic tissue within the wound bed including Adherent Slough. The periwound skin appearance exhibited: Scarring, Dry/Scaly, Hemosiderin Staining. The periwound skin appearance did not exhibit: Callus, Crepitus, Excoriation, Induration, Rash, Maceration, Atrophie Blanche, Cyanosis, Ecchymosis, Mottled, Pallor, Rubor,  Erythema. The periwound has tenderness on palpation. 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 [facility] Wednesday Room 8 weekly 11/06/2022 (already scheduled) Return Appointment in 2 weeks. - 11/13/2022 [facility] Wednesday (already scheduled) Other: - home health to wash right leg and foot with soap (dial soap) and water with dressing changes. Patient to have soap and towels next to her when Home Health arrives at the house. *****[facility] compounding topical antibiotics. FOLLOW THE PHARMACY INSTRUCTIONS ON HOW TO MIX, and apply under the hydrofera blue.****** ****PLEASE APPLY ZINC OXIDE as needed AROUND THE PERIWOUND DUE TO MACERATION. ****Patient to bring in the [facility] topical antibiotics to each wound care appt as well.***** *****HOME HEALTH TO PLEASE WRAP FROM BASE OF TOES TO JUST BELOW KNEE. please appy first layer of unna boot at the bast of the toes and just below the knee to help hold the compression wrap in place.**** Anesthetic: Wound #5 Right,Lateral Lower Leg: (In clinic) Topical Lidocaine 5% applied to wound bed (In clinic) Topical Lidocaine 4% applied to wound bed Bathing/ Shower/ Hygiene: May shower and wash wound with soap and water. - with dressing changes. Edema Control - Lymphedema / SCD / Other: Elevate legs to the level of the heart or above for 30 minutes daily and/or when sitting for 3-4 times a day throughout the day. Avoid standing for long periods of time. Exercise regularly Home Health: Wound #5 Right,Lateral Lower Leg: No change in wound care orders this week; continue Home  Health for wound care. May utilize formulary equivalent dressing for wound treatment orders unless otherwise specified. Other Home Health Orders/Instructions: - [facility] [place], [place] fax # [phone]. WOUND #5: - Lower Leg Wound Laterality: Right, Lateral Cleanser: Soap and Water 3 x Per Week/30 Days Discharge Instructions: May shower and wash wound with dial antibacterial soap and water prior to dressing change. Peri-Wound Care: Zinc Oxide Ointment 30g tube 3 x Per  Week/30 Days Discharge Instructions: Apply Zinc Oxide to periwound with each dressing change TO ANY MACERATED SKIN. Peri-Wound Care: Sween Lotion (Moisturizing lotion) 3 x Per Week/30 Days Discharge Instructions: Apply moisturizing lotion as directed Topical: compounding topical antibiotics 3 x Per Week/30 Days Discharge Instructions: apply directly to wound bed under the hydrofera blue. Home Health start. MIX ACCORDING TO THE PHARMACY INSTRUCTIONS. Prim Dressing: Hydrofera Blue Ready Transfer Foam, 4x5 (in/in) (Generic) 3 x Per Week/30 Days ary Discharge Instructions: Apply over the topical antibiotics wound bed as instructed Secondary Dressing: ABD Pad, 8x10 3 x Per Week/30 Days Discharge Instructions: Apply over primary dressing as directed. Secondary Dressing: Woven Gauze Sponge, Non-Sterile 4x4 in 3 x Per Week/30 Days Discharge Instructions: Apply over primary dressing as directed. Secondary Dressing: Zetuvit Plus 4x8 in 3 x Per Week/30 Days Discharge Instructions: Or double ABD pad Apply over primary dressing as directed. Com pression Wrap: Kerlix Roll 4.5x3.1 (in/yd) 3 x Per Week/30 Days Discharge Instructions: Apply Kerlix and Coban compression as directed. Com pression Wrap: Coban Self-Adherent Wrap 4x5 (in/yd) 3 x Per Week/30 Days Discharge Instructions: Apply over Kerlix as directed. Com pression Wrap: unna boot FIRST LAYER **** SEE INSTRUCTIONS 3 x Per Week/30 Days Discharge Instructions:  APPLY FIRST LAYER UNNA BOOT AT BASES OF TOES AND JUST BELOW THE KNEE TO HOLD COMPRESSION WRAP IN PLACE. 1. At this point I did not see any reason to change the primary dressing which is [facility] under kerlix Coban 2. Apparently TheraSkin was run through her insurance there is a $250 per application co-pay which I think may be beyond the patient's means. However the wound appears to be contracting after discussion with our intake nurse I did not go through with this for the moment 3. I was not really happy with the degree of swelling from the mid leg to the knee although this apparently was more than usually seen. There was no evidence of an acute DVT but a left knee wondering what whether she might need more aggressive compression. EUPHA, LOBB ([id number]) [id number] Page 9 of 9 Electronic Signature(s) Signed: 10/30/2022 4:12:50 PM By: [name] MD Entered By: [name] on 10/30/2022 11:11:05 -------------------------------------------------------------------------------- SuperBill Details Patient Name: Date of Service: Jamie [name], Christean Grief. 10/30/2022 Medical Record Number: [id number] Patient Account Number: [id number] Date of Birth/Sex: Treating RN: [birth year] (82 y.o. 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

## 2022-10-31 NOTE — Progress Notes (Signed)
AVREY, FLANAGIN ([id number]) [id number] Page 1 of 10 Visit Report for 10/30/2022 Arrival Information Details Patient Name: Date of Service: [name], [name]. 10/30/2022 10:15 A M Medical Record Number: [id number] Patient Account Number: [id number] Date of Birth/Sex: Treating RN: 06/30/1940 (82 y.o. F) Primary Care Dolores Ewing: [name] Other Clinician: Referring Manolo Bosket: Treating Fronnie Urton/Extender: Vivia Budge in Treatment: 23 Visit Information History Since Last Visit All ordered tests and consults were completed: No Patient Arrived: Wheel Chair Added or deleted any medications: No Arrival Time: 10:18 Any new allergies or adverse reactions: No Accompanied By: daughter Had a fall or experienced change in No Transfer Assistance: None activities of daily living that may affect Patient Identification Verified: Yes risk of falls: Secondary Verification Process Completed: Yes Signs or symptoms of abuse/neglect since last visito No Patient Requires Transmission-Based Precautions: No Hospitalized since last visit: No Patient Has Alerts: No Implantable device outside of the clinic excluding No cellular tissue based products placed in the center since last visit: Pain Present Now: No Electronic Signature(s) Signed: 10/30/2022 10:44:29 AM By: Dayton Scrape Entered By: Dayton Scrape on 10/30/2022 10:18:58 -------------------------------------------------------------------------------- Clinic Level of Care Assessment Details Patient Name: Date of Service: [name], [name]. 10/30/2022 10:15 A M Medical Record Number: [id number] Patient Account Number: [id number] Date of Birth/Sex: Treating RN: 08-Mar-1941 (82 y.o. [name] Primary Care Ishia Tenorio: [name] Other Clinician: Referring Majid Mccravy: Treating Jaqwan Wieber/Extender: Vivia Budge in Treatment:  23 Clinic Level of Care Assessment Items TOOL 4 Quantity Score X- 1 0 Use when only an EandM is performed on FOLLOW-UP visit ASSESSMENTS - Nursing Assessment / Reassessment X- 1 10 Reassessment of Co-morbidities (includes updates in patient status) X- 1 5 Reassessment of Adherence to Treatment Plan ASSESSMENTS - Wound and Skin A ssessment / Reassessment X - Simple Wound Assessment / Reassessment - one wound 1 5 []  - 0 Complex Wound Assessment / Reassessment - multiple wounds []  - 0 Dermatologic / Skin Assessment (not related to wound area) ASSESSMENTS - Focused Assessment X- 1 5 Circumferential Edema Measurements - multi extremities []  - 0 Nutritional Assessment / Counseling / Intervention SHAYNE, DEERMAN ([id number]) [phone].pdf Page 2 of 10 []  - 0 Lower Extremity Assessment (monofilament, tuning fork, pulses) []  - 0 Peripheral Arterial Disease Assessment (using hand held doppler) ASSESSMENTS - Ostomy and/or Continence Assessment and Care []  - 0 Incontinence Assessment and Management []  - 0 Ostomy Care Assessment and Management (repouching, etc.) PROCESS - Coordination of Care X - Simple Patient / Family Education for ongoing care 1 15 []  - 0 Complex (extensive) Patient / Family Education for ongoing care X- 1 10 Staff obtains Chiropractor, Records, T Results / Process Orders est X- 1 10 Staff telephones HHA, Nursing Homes / Clarify orders / etc []  - 0 Routine Transfer to another Facility (non-emergent condition) []  - 0 Routine Hospital Admission (non-emergent condition) []  - 0 New Admissions / Manufacturing engineer / Ordering NPWT Apligraf, etc. , []  - 0 Emergency Hospital Admission (emergent condition) X- 1 10 Simple Discharge Coordination []  - 0 Complex (extensive) Discharge Coordination PROCESS - Special Needs []  - 0 Pediatric / Minor Patient Management []  - 0 Isolation Patient Management []  - 0 Hearing / Language / Visual  special needs []  - 0 Assessment of Community assistance (transportation, D/C planning, etc.) []  - 0 Additional assistance / Altered mentation []  - 0 Support Surface(s) Assessment (bed, cushion, seat, etc.) INTERVENTIONS - Wound Cleansing / Measurement X - Simple  Wound Cleansing - one wound 1 5 []  - 0 Complex Wound Cleansing - multiple wounds X- 1 5 Wound Imaging (photographs - any number of wounds) []  - 0 Wound Tracing (instead of photographs) X- 1 5 Simple Wound Measurement - one wound []  - 0 Complex Wound Measurement - multiple wounds INTERVENTIONS - Wound Dressings []  - 0 Small Wound Dressing one or multiple wounds []  - 0 Medium Wound Dressing one or multiple wounds X- 1 20 Large Wound Dressing one or multiple wounds []  - 0 Application of Medications - topical []  - 0 Application of Medications - injection INTERVENTIONS - Miscellaneous []  - 0 External ear exam []  - 0 Specimen Collection (cultures, biopsies, blood, body fluids, etc.) []  - 0 Specimen(s) / Culture(s) sent or taken to Lab for analysis []  - 0 Patient Transfer (multiple staff / Nurse, adult / Similar devices) []  - 0 Simple Staple / Suture removal (25 or less) []  - 0 Complex Staple / Suture removal (26 or more) []  - 0 Hypo / Hyperglycemic Management (close monitor of Blood Glucose) RAPHAEL, ESPE ([id number]) [id number] Page 3 of 10 []  - 0 Ankle / Brachial Index (ABI) - do not check if billed separately X- 1 5 Vital Signs Has the patient been seen at the hospital within the last three years: Yes Total Score: 110 Level Of Care: New/Established - Level 3 Electronic Signature(s) Signed: 10/30/2022 6:08:49 PM By: Shawn Stall RN, BSN Entered By: Shawn Stall on 10/30/2022 11:00:02 -------------------------------------------------------------------------------- Encounter Discharge Information Details Patient Name: Date of Service: [name], [name] 10/30/2022 10:15  A M Medical Record Number: [id number] Patient Account Number: [id number] Date of Birth/Sex: Treating RN: Oct 09, 1940 (82 y.o. [name] Primary Care Louise Victory: [name] Other Clinician: Referring Sharrod Achille: Treating Jaysten Essner/Extender: Vivia Budge in Treatment: 23 Encounter Discharge Information Items Discharge Condition: Stable Ambulatory Status: Wheelchair Discharge Destination: Home Transportation: Private Auto Accompanied By: niece Schedule Follow-up Appointment: Yes Clinical Summary of Care: Electronic Signature(s) Signed: 10/30/2022 6:08:49 PM By: Shawn Stall RN, BSN Entered By: Shawn Stall on 10/30/2022 11:00:46 -------------------------------------------------------------------------------- Lower Extremity Assessment Details Patient Name: Date of Service: [name] 10/30/2022 10:15 A M Medical Record Number: [id number] Patient Account Number: [id number] Date of Birth/Sex: Treating RN: [birth year] (82 y.o. F) Primary Care Suleima Ohlendorf: [name] Other Clinician: Referring Daniil Labarge: Treating Dnya Hickle/Extender: [name] Weeks in Treatment: 23 Edema Assessment Assessed: [Left: No] [Right: No] Edema: [Left: Ye] [Right: s] Calf Left: Right: Point of Measurement: From Medial Instep 35 cm Ankle Left: Right: Point of Measurement: From Medial Instep 25 cm Vascular Assessment PATTYE, MEDA ([id number]) [Right:[id number] Page 4 of 10] Extremity colors, hair growth, and conditions: Extremity Color: [Right:Normal] Hair Growth on Extremity: [Right:No] Temperature of Extremity: [Right:Warm] Capillary Refill: [Right:< 3 seconds] Dependent Rubor: [Right:No No] Toe Nail Assessment Left: Right: Thick: Yes Discolored: Yes Deformed: Yes Improper Length and Hygiene: Yes Electronic Signature(s) Signed: 10/31/2022 4:36:58 PM By: Thayer Dallas Entered By: Thayer Dallas on 10/30/2022 10:43:02 -------------------------------------------------------------------------------- Multi Wound Chart Details Patient Name: Date of Service: [name], [name] 10/30/2022 10:15 A M Medical Record Number: [id number] Patient Account Number: [id number] Date of Birth/Sex: Treating RN: 07/21/1940 (82 y.o. F) Primary Care Holleigh Crihfield: [name] Other Clinician: Referring Bethsaida Siegenthaler: Treating Sunshyne Horvath/Extender: Vivia Budge in Treatment: 23 Vital Signs Height(in): 62 Pulse(bpm): 54 Weight(lbs): 171 Blood Pressure(mmHg): 156/92 Body Mass Index(BMI): 31.3 Temperature(F): 97.7 Respiratory Rate(breaths/min): 18 [5:Photos:] [N/A:N/A] Right, Lateral Lower Leg N/A N/A  Wound Location: Trauma N/A N/A Wounding Event: Venous Leg Ulcer N/A N/A Primary Etiology: Sleep Apnea, Hypertension, Peripheral N/A N/A Comorbid History: Venous Disease, Osteoarthritis, Neuropathy 04/17/2022 N/A N/A Date Acquired: 81 N/A N/A Weeks of Treatment: Open N/A N/A Wound Status: No N/A N/A Wound Recurrence: Yes N/A N/A Clustered Wound: 4 N/A N/A Clustered Quantity: 12.5x6x0.2 N/A N/A Measurements L x W x D (cm) 58.905 N/A N/A A (cm) : rea 11.781 N/A N/A Volume (cm) : -125.10% N/A N/A % Reduction in Area: -50.10% N/A N/A % Reduction in Volume: Full Thickness Without Exposed N/A N/A Classification: Support Structures Medium N/A N/A Exudate Amount: Serosanguineous N/A N/A Exudate Type: red, brown N/A N/A Exudate ColorANEIRA, CAVITT ([id number]) [id number] Page 5 of 10 Distinct, outline attached N/A N/A Wound Margin: Medium (34-66%) N/A N/A Granulation Amount: Red N/A N/A Granulation Quality: Medium (34-66%) N/A N/A Necrotic Amount: Fat Layer (Subcutaneous Tissue): Yes N/A N/A Exposed Structures: Fascia: No Tendon: No Muscle: No Joint: No Bone:  No Small (1-33%) N/A N/A Epithelialization: Scarring: Yes N/A N/A Periwound Skin Texture: Excoriation: No Induration: No Callus: No Crepitus: No Rash: No Dry/Scaly: Yes N/A N/A Periwound Skin Moisture: Maceration: No Hemosiderin Staining: Yes N/A N/A Periwound Skin Color: Atrophie Blanche: No Cyanosis: No Ecchymosis: No Erythema: No Mottled: No Pallor: No Rubor: No Yes N/A N/A Tenderness on Palpation: Treatment Notes Wound #5 (Lower Leg) Wound Laterality: Right, Lateral Cleanser Soap and Water Discharge Instruction: May shower and wash wound with dial antibacterial soap and water prior to dressing change. Peri-Wound Care Zinc Oxide Ointment 30g tube Discharge Instruction: Apply Zinc Oxide to periwound with each dressing change TO ANY MACERATED SKIN. Sween Lotion (Moisturizing lotion) Discharge Instruction: Apply moisturizing lotion as directed Topical compounding topical antibiotics Discharge Instruction: apply directly to wound bed under the hydrofera blue. Home Health start. MIX ACCORDING TO THE PHARMACY INSTRUCTIONS. Primary Dressing Hydrofera Blue Ready Transfer Foam, 4x5 (in/in) Discharge Instruction: Apply over the topical antibiotics wound bed as instructed Secondary Dressing ABD Pad, 8x10 Discharge Instruction: Apply over primary dressing as directed. Woven Gauze Sponge, Non-Sterile 4x4 in Discharge Instruction: Apply over primary dressing as directed. Zetuvit Plus 4x8 in Discharge Instruction: Or double ABD pad Apply over primary dressing as directed. Secured With Compression Wrap Kerlix Roll 4.5x3.1 (in/yd) Discharge Instruction: Apply Kerlix and Coban compression as directed. Coban Self-Adherent Wrap 4x5 (in/yd) Discharge Instruction: Apply over Kerlix as directed. unna boot FIRST LAYER **** SEE INSTRUCTIONS Discharge Instruction: APPLY FIRST LAYER UNNA BOOT AT BASES OF TOES AND JUST BELOW THE KNEE TO HOLD COMPRESSION WRAP IN PLACE. Compression  Stockings Add-Ons Electronic Signature(s) TRICIA, PLEDGER ([id number]) [id number] Page 6 of 10 Signed: 10/30/2022 4:12:50 PM By: [name] MD Entered By: [name] on 10/30/2022 11:04:32 -------------------------------------------------------------------------------- Multi-Disciplinary Care Plan Details Patient Name: Date of Service: [name], [place] W. 10/30/2022 10:15 A M Medical Record Number: [id number] Patient Account Number: [id number] Date of Birth/Sex: Treating RN: Aug 12, 1940 (82 y.o. [name] Primary Care Kiylah Loyer: [name] Other Clinician: Referring Dragon Thrush: Treating Roshad Hack/Extender: Vivia Budge in Treatment: 23 Multidisciplinary Care Plan reviewed with physician Active Inactive Pain, Acute or Chronic Nursing Diagnoses: Pain Management - Cyclic Acute (Dressing Change Related) Pain Management - Non-cyclic Acute (Procedural) Pain, acute or chronic: actual or potential Goals: Patient will verbalize adequate pain control and receive pain control interventions during procedures as needed Date Initiated: 09/11/2022 Target Resolution Date: 02/06/2023 Goal Status: Active Patient/caregiver will verbalize adequate pain control between visits Date Initiated: 09/11/2022  Target Resolution Date: 02/06/2023 Goal Status: Active Patient/caregiver will verbalize comfort level met Date Initiated: 09/11/2022 Target Resolution Date: 02/06/2023 Goal Status: Active Interventions: Complete pain assessment as per visit requirements Encourage patient to take pain medications as prescribed Provide education on pain management Provision of support: recognize patient pain, provide comfort and support as needed Reposition patient for comfort Treatment Activities: Administer pain control measures as ordered : 09/11/2022 Notes: Electronic Signature(s) Signed: 10/30/2022 6:08:49 PM By: Shawn Stall RN, BSN Entered By: Shawn Stall on 10/30/2022 10:38:38 -------------------------------------------------------------------------------- Pain Assessment Details Patient Name: Date of Service: [name], [name] 10/30/2022 10:15 A M Medical Record Number: [id number] Patient Account Number: [id number] Date of Birth/Sex: Treating RN: February 18, 1941 (82 y.o. F) Primary Care Manley Fason: [name] Other Clinician: Referring Cauy Melody: Treating Nakota Elsen/Extender: Vivia Budge in Treatment: [address] [place] W ([id number]) [id number] Page 7 of 10 Active Problems Location of Pain Severity and Description of Pain Patient Has Paino Yes Site Locations Rate the pain. Current Pain Level: 9 Worst Pain Level: 10 Least Pain Level: 0 Tolerable Pain Level: 2 Pain Management and Medication Current Pain Management: Electronic Signature(s) Signed: 10/30/2022 10:44:29 AM By: Dayton Scrape Entered By: Dayton Scrape on 10/30/2022 10:20:44 -------------------------------------------------------------------------------- Patient/Caregiver Education Details Patient Name: Date of Service: [name] 7/24/2024andnbsp10:15 A M Medical Record Number: [id number] Patient Account Number: [id number] Date of Birth/Gender: Treating RN: 10-31-1940 (82 y.o. [name] Primary Care Physician: [name] Other Clinician: Referring Physician: Treating Physician/Extender: Vivia Budge in Treatment: 23 Education Assessment Education Provided To: Patient Education Topics Provided Wound/Skin Impairment: Handouts: Caring for Your Ulcer Methods: Explain/Verbal Responses: Reinforcements needed Electronic Signature(s) Signed: 10/30/2022 6:08:49 PM By: Shawn Stall RN, BSN Entered By: Shawn Stall on 10/30/2022 10:38:50 [name] ([id number])  [id number] Page 8 of 10 -------------------------------------------------------------------------------- Wound Assessment Details Patient Name: Date of Service: [name], [name]. 10/30/2022 10:15 A M Medical Record Number: [id number] Patient Account Number: [id number] Date of Birth/Sex: Treating RN: October 23, 1940 (82 y.o. F) Primary Care Omarrion Carmer: [name] Other Clinician: Referring Keller Bounds: Treating Kamrin Spath/Extender: [name] Weeks in Treatment: 23 Wound Status Wound Number: 5 Primary Venous Leg Ulcer Etiology: Wound Location: Right, Lateral Lower Leg Wound Open Wounding Event: Trauma Status: Date Acquired: 04/17/2022 Comorbid Sleep Apnea, Hypertension, Peripheral Venous Disease, Weeks Of Treatment: 23 History: Osteoarthritis, Neuropathy Clustered Wound: Yes Photos Wound Measurements Length: (cm) Width: (cm) Depth: (cm) Clustered Quantity: Area: (cm) Volume: (cm) 12.5 % Reduction in Area: -125.1% 6 % Reduction in Volume: -50.1% 0.2 Epithelialization: Small (1-33%) 4 Tunneling: No 58.905 Undermining: No 11.781 Wound Description Classification: Full Thickness Without Exposed Supp Wound Margin: Distinct, outline attached Exudate Amount: Medium Exudate Type: Serosanguineous Exudate Color: red, brown ort Structures Foul Odor After Cleansing: No Slough/Fibrino Yes Wound Bed Granulation Amount: Medium (34-66%) Exposed Structure Granulation Quality: Red Fascia Exposed: No Necrotic Amount: Medium (34-66%) Fat Layer (Subcutaneous Tissue) Exposed: Yes Necrotic Quality: Adherent Slough Tendon Exposed: No Muscle Exposed: No Joint Exposed: No Bone Exposed: No Periwound Skin Texture Texture Color No Abnormalities Noted: No No Abnormalities Noted: No Callus: No Atrophie Blanche: No Crepitus: No Cyanosis: No Excoriation: No Ecchymosis: No Induration: No Erythema: No Rash: No Hemosiderin  Staining: Yes Scarring: Yes Mottled: No Pallor: No Moisture Rubor: No No Abnormalities Noted: No Dry / Scaly: Yes Temperature / Pain Maceration: No Tenderness on Palpation: Yes Treatment Notes Wound #5 (Lower Leg) Wound Laterality: Right, Lateral SOILA, PRINTUP ([id number]) [id number] Page 9  of 10 Cleanser Soap and Water Discharge Instruction: May shower and wash wound with dial antibacterial soap and water prior to dressing change. Peri-Wound Care Zinc Oxide Ointment 30g tube Discharge Instruction: Apply Zinc Oxide to periwound with each dressing change TO ANY MACERATED SKIN. Sween Lotion (Moisturizing lotion) Discharge Instruction: Apply moisturizing lotion as directed Topical compounding topical antibiotics Discharge Instruction: apply directly to wound bed under the hydrofera blue. Home Health start. MIX ACCORDING TO THE PHARMACY INSTRUCTIONS. Primary Dressing Hydrofera Blue Ready Transfer Foam, 4x5 (in/in) Discharge Instruction: Apply over the topical antibiotics wound bed as instructed Secondary Dressing ABD Pad, 8x10 Discharge Instruction: Apply over primary dressing as directed. Woven Gauze Sponge, Non-Sterile 4x4 in Discharge Instruction: Apply over primary dressing as directed. Zetuvit Plus 4x8 in Discharge Instruction: Or double ABD pad Apply over primary dressing as directed. Secured With Compression Wrap Kerlix Roll 4.5x3.1 (in/yd) Discharge Instruction: Apply Kerlix and Coban compression as directed. Coban Self-Adherent Wrap 4x5 (in/yd) Discharge Instruction: Apply over Kerlix as directed. unna boot FIRST LAYER **** SEE INSTRUCTIONS Discharge Instruction: APPLY FIRST LAYER UNNA BOOT AT BASES OF TOES AND JUST BELOW THE KNEE TO HOLD COMPRESSION WRAP IN PLACE. Compression Stockings Add-Ons Electronic Signature(s) Signed: 10/31/2022 4:36:58 PM By: Thayer Dallas Entered By: Thayer Dallas on 10/30/2022  10:40:52 -------------------------------------------------------------------------------- Vitals Details Patient Name: Date of Service: [name], [name] 10/30/2022 10:15 A M Medical Record Number: [id number] Patient Account Number: [id number] Date of Birth/Sex: Treating RN: 09/13/1940 (82 y.o. F) Primary Care Jerrold Haskell: [name] Other Clinician: Referring Quinntin Malter: Treating Ryen Rhames/Extender: Vivia Budge in Treatment: 23 Vital Signs Time Taken: 10:19 Temperature (F): 97.7 Height (in): 62 Pulse (bpm): 54 Weight (lbs): 171 Respiratory Rate (breaths/min): 18 Body Mass Index (BMI): 31.3 Blood Pressure (mmHg): 156/92 Reference Range: 80 - 120 mg / dl Electronic Signature(s) Signed: 10/30/2022 10:44:29 AM By: Darrick Meigs, Takayla W10:44:29 AM By: Dayton Scrape Signed: 10/30/2022 ([id number]) [id number] Page 10 of 10 Entered By: Dayton Scrape on 10/30/2022 10:20:23

## 2022-11-04 NOTE — Progress Notes (Signed)
Jamie [name], Jamie [name] ([id number]) [id number] Page 1 of 6 Visit Report for 10/23/2022 Arrival Information Details Patient Name: Date of Service: Jamie [name], [name]. 10/23/2022 10:15 A M Medical Record Number: [id number] Patient Account Number: [id number] Date of Birth/Sex: Treating RN: 1940/12/28 (82 y.o. F) Primary Care Stacia Feazell: [name] Other Clinician: Referring Mariadelaluz Guggenheim: Treating Berkleigh Beckles/Extender: [name], [name] in Treatment: 22 Visit Information History Since Last Visit Added or deleted any medications: No Patient Arrived: Wheel Chair Any new allergies or adverse reactions: No Arrival Time: 10:05 Had a fall or experienced change in No Accompanied By: niece activities of daily living that may affect Transfer Assistance: None risk of falls: Patient Identification Verified: Yes Signs or symptoms of abuse/neglect since last visito No Secondary Verification Process Completed: Yes Hospitalized since last visit: No Patient Requires Transmission-Based Precautions: No Implantable device outside of the clinic excluding No Patient Has Alerts: No cellular tissue based products placed in the center since last visit: Has Dressing in Place as Prescribed: Yes Has Compression in Place as Prescribed: Yes Pain Present Now: Yes Electronic Signature(s) Signed: 10/24/2022 4:50:33 PM By: Thayer Dallas Entered By: Thayer Dallas on 10/23/2022 10:15:23 -------------------------------------------------------------------------------- Complex / Palliative Patient Assessment Details Patient Name: Date of Service: Jamie Mart W. 10/23/2022 10:15 A M Medical Record Number: [id number] Patient Account Number: [id number] Date of Birth/Sex: Treating RN: 1940-10-26 (82 y.o. [name] Primary Care Beyla Loney: [name] Other Clinician: Referring Jakson Delpilar: Treating Cope Marte/Extender: [name], [name] in Treatment: 22 Complex Wound Management Criteria Patient has remarkable or complex co-morbidities requiring medications or treatments that extend wound healing times. Examples: Diabetes mellitus with chronic renal failure or end stage renal disease requiring dialysis Advanced or poorly controlled rheumatoid arthritis Diabetes mellitus and end stage chronic obstructive pulmonary disease Active cancer with current chemo- or radiation therapy SA, HTN, PVD, OA, neuropathy, anxiety, depression, chronic pain. Palliative Wound Management Criteria Care Approach Wound Care Plan: Complex Wound Management Electronic Signature(s) Signed: 10/24/2022 8:13:01 AM By: Shawn Stall RN, BSN Signed: 11/04/2022 4:49:37 PM By: Allen Derry PA-C Entered By: Shawn Stall on 10/24/2022 08:13:01 Jamie [name] ([id number]) [id number] Page 2 of 6 -------------------------------------------------------------------------------- Encounter Discharge Information Details Patient Name: Date of Service: Jamie [name], [name]. 10/23/2022 10:15 A M Medical Record Number: [id number] Patient Account Number: [id number] Date of Birth/Sex: Treating RN: 1940/07/17 (82 y.o. [name] Primary Care Angelica Frandsen: [name] Other Clinician: Referring Videl Nobrega: Treating Emaya Preston/Extender: [name], Billey Gosling in Treatment: 22 Encounter Discharge Information Items Discharge Condition: Stable Ambulatory Status: Wheelchair Discharge Destination: Home Transportation: Private Auto Accompanied By: Niece Schedule Follow-up Appointment: Yes Clinical Summary of Care: Electronic Signature(s) Signed: 10/23/2022 5:02:36 PM By: Shawn Stall RN, BSN Entered By: Shawn Stall on 10/23/2022 10:58:09 -------------------------------------------------------------------------------- Lower Extremity Assessment Details Patient Name:  Date of Service: Jamie [name]. 10/23/2022 10:15 A M Medical Record Number: [id number] Patient Account Number: [id number] Date of Birth/Sex: Treating RN: 15-Nov-1940 (82 y.o. F) Primary Care Madia Carvell: [name] Other Clinician: Referring Feras Gardella: Treating Diontae Route/Extender: [name], [name] in Treatment: 22 Edema Assessment Assessed: [Left: No] [Right: No] Edema: [Left: Ye] [Right: s] Calf Left: Right: Point of Measurement: From Medial Instep 35 cm Ankle Left: Right: Point of Measurement: From Medial Instep 25.5 cm Vascular Assessment Extremity colors, hair growth, and conditions: Extremity Color: [Right:Normal] Hair Growth on Extremity: [Right:No] Temperature of Extremity: [Right:Warm] Capillary Refill: [Right:< 3 seconds] Dependent Rubor: [Right:No No] Electronic Signature(s)  Signed: 10/24/2022 4:50:33 PM By: Roxanne Gates ([id number]) [id number] Page 3 of 6 Entered By: Thayer Dallas on 10/23/2022 10:22:39 -------------------------------------------------------------------------------- Multi-Disciplinary Care Plan Details Patient Name: Date of Service: Jamie [name], [name]. 10/23/2022 10:15 A M Medical Record Number: [id number] Patient Account Number: [id number] Date of Birth/Sex: Treating RN: January 30, 1941 (82 y.o. [name] Primary Care Mateen Franssen: [name] Other Clinician: Referring Zoeya Gramajo: Treating Nili Honda/Extender: [name] in Treatment: 22 Multidisciplinary Care Plan reviewed with physician Active Inactive Pain, Acute or Chronic Nursing Diagnoses: Pain Management - Cyclic Acute (Dressing Change Related) Pain Management - Non-cyclic Acute (Procedural) Pain, acute or chronic: actual or potential Goals: Patient will verbalize adequate pain control and receive pain control interventions during procedures as  needed Date Initiated: 09/11/2022 Target Resolution Date: 02/06/2023 Goal Status: Active Patient/caregiver will verbalize adequate pain control between visits Date Initiated: 09/11/2022 Target Resolution Date: 02/06/2023 Goal Status: Active Patient/caregiver will verbalize comfort level met Date Initiated: 09/11/2022 Target Resolution Date: 02/06/2023 Goal Status: Active Interventions: Complete pain assessment as per visit requirements Encourage patient to take pain medications as prescribed Provide education on pain management Provision of support: recognize patient pain, provide comfort and support as needed Reposition patient for comfort Treatment Activities: Administer pain control measures as ordered : 09/11/2022 Notes: Electronic Signature(s) Signed: 10/23/2022 5:02:36 PM By: Shawn Stall RN, BSN Entered By: Shawn Stall on 10/23/2022 10:55:30 -------------------------------------------------------------------------------- Pain Assessment Details Patient Name: Date of Service: Jamie [name], [name] 10/23/2022 10:15 A M Medical Record Number: [id number] Patient Account Number: [id number] Date of Birth/Sex: Treating RN: [birth year] (82 y.o. F) Primary Care Briell Paulette: [name] Other Clinician: Referring Gregg Holster: Treating Addalyn Speedy/Extender: [name], [place] in Treatment: [address] [place] W ([id number]) [id number] Page 4 of 6 Active Problems Location of Pain Severity and Description of Pain Patient Has Paino Yes Site Locations Pain Location: Pain in Ulcers Rate the pain. Current Pain Level: 9 Pain Management and Medication Current Pain Management: Electronic Signature(s) Signed: 10/24/2022 4:50:33 PM By: Thayer Dallas Entered By: Thayer Dallas on 10/23/2022 10:18:09 -------------------------------------------------------------------------------- Patient/Caregiver Education Details Patient Name:  Date of Service: Jamie [name] 7/17/2024andnbsp10:15 A M Medical Record Number: [id number] Patient Account Number: [id number] Date of Birth/Gender: Treating RN: Nov 03, 1940 (82 y.o. [name] Primary Care Physician: [name] Other Clinician: Referring Physician: Treating Physician/Extender: [name], Billey Gosling in Treatment: 22 Education Assessment Education Provided To: Patient Education Topics Provided Wound Debridement: Handouts: Wound Debridement Methods: Explain/Verbal Responses: Reinforcements needed Electronic Signature(s) Signed: 10/23/2022 5:02:36 PM By: Shawn Stall RN, BSN Entered By: Shawn Stall on 10/23/2022 10:56:14 Jamie [name] ([id number]) [id number] Page 5 of 6 -------------------------------------------------------------------------------- Wound Assessment Details Patient Name: Date of Service: Jamie [name], [name]. 10/23/2022 10:15 A M Medical Record Number: [id number] Patient Account Number: [id number] Date of Birth/Sex: Treating RN: 04-Oct-1940 (82 y.o. F) Primary Care Rigo Letts: [name] Other Clinician: Referring Hallee Mckenny: Treating Delan Ksiazek/Extender: [name], [name] in Treatment: 22 Wound Status Wound Number: 5 Primary Venous Leg Ulcer Etiology: Wound Location: Right, Lateral Lower Leg Wound Open Wounding Event: Trauma Status: Date Acquired: 04/17/2022 Comorbid Sleep Apnea, Hypertension, Peripheral Venous Disease, Weeks Of Treatment: 22 History: Osteoarthritis, Neuropathy Clustered Wound: Yes Photos Wound Measurements Length: (cm) 12.7 Width: (cm) 8 Depth: (cm) 0.2 Clustered Quantity: 4 Area: (cm) 79.796 Volume: (cm) 15.959 % Reduction in Area: -204.9% % Reduction in Volume: -103.3% Epithelialization: Small (1-33%) Tunneling: No Undermining: No Wound Description Classification: Full  Thickness Without  Exposed Supp Wound Margin: Distinct, outline attached Exudate Amount: Medium Exudate Type: Serosanguineous Exudate Color: red, brown ort Structures Foul Odor After Cleansing: No Slough/Fibrino Yes Wound Bed Granulation Amount: Medium (34-66%) Exposed Structure Granulation Quality: Red Fascia Exposed: No Necrotic Amount: Medium (34-66%) Fat Layer (Subcutaneous Tissue) Exposed: Yes Necrotic Quality: Adherent Slough Tendon Exposed: No Muscle Exposed: No Joint Exposed: No Bone Exposed: No Periwound Skin Texture Texture Color No Abnormalities Noted: No No Abnormalities Noted: No Callus: No Atrophie Blanche: No Crepitus: No Cyanosis: No Excoriation: No Ecchymosis: No Induration: No Erythema: No Rash: No Hemosiderin Staining: Yes Scarring: Yes Mottled: No Pallor: No Moisture Rubor: No No Abnormalities Noted: No Dry / Scaly: Yes Temperature / Pain Maceration: No Tenderness on Palpation: Yes Electronic Signature(s) Signed: 10/24/2022 4:50:33 PM By: Thayer Dallas Entered By: Thayer Dallas on 10/23/2022 10:23:49 Jamie [name] ([id number]) [id number] Page 6 of 6 -------------------------------------------------------------------------------- Vitals Details Patient Name: Date of Service: Jamie [name], [name]. 10/23/2022 10:15 A M Medical Record Number: [id number] Patient Account Number: [id number] Date of Birth/Sex: Treating RN: [birth year] (82 y.o. F) Primary Care Elaf Clauson: [name] Other Clinician: Referring Virgil Lightner: Treating Torrie Namba/Extender: [name], [name] in Treatment: 22 Vital Signs Time Taken: 10:15 Temperature (F): 98.4 Height (in): 62 Pulse (bpm): 55 Weight (lbs): 171 Respiratory Rate (breaths/min): 18 Body Mass Index (BMI): 31.3 Blood Pressure (mmHg): 113/70 Reference Range: 80 - 120 mg / dl Electronic Signature(s) Signed: 10/24/2022 4:50:33 PM By: Thayer Dallas Entered  By: Thayer Dallas on 10/23/2022 10:15:42

## 2022-11-06 ENCOUNTER — Encounter (HOSPITAL_BASED_OUTPATIENT_CLINIC_OR_DEPARTMENT_OTHER): Payer: Medicare HMO | Admitting: Physician Assistant

## 2022-11-06 DIAGNOSIS — I87331 Chronic venous hypertension (idiopathic) with ulcer and inflammation of right lower extremity: Secondary | ICD-10-CM | POA: Diagnosis not present

## 2022-11-06 NOTE — Progress Notes (Signed)
DAZJA, ONSTAD ([id number]) [id number] Page 1 of 4 Visit Report for 11/06/2022 Chief Complaint Document Details Patient Name: Date of Service: Jamie [name], [name]. 11/06/2022 10:15 A M Medical Record Number: [id number] Patient Account Number: [id number] Date of Birth/Sex: Treating RN: 25-Nov-1940 (82 y.o. F) Primary Care Provider: [name] Other Clinician: Referring Provider: Treating Provider/Extender: [name], [name] in Treatment: 24 Information Obtained from: Patient Chief Complaint Bilateral LE Ulcers Electronic Signature(s) Signed: 11/06/2022 10:30:32 AM By: Allen Derry PA-C Entered By: Allen Derry on 11/06/2022 10:30:32 -------------------------------------------------------------------------------- Debridement Details Patient Name: Date of Service: Jamie [name], [name] 11/06/2022 10:15 A M Medical Record Number: [id number] Patient Account Number: [id number] Date of Birth/Sex: Treating RN: Oct 13, 1940 (82 y.o. [name] Primary Care Provider: [name] Other Clinician: Referring Provider: Treating Provider/Extender: [name], [name] in Treatment: 24 Debridement Performed for Assessment: Wound #5 Right,Lateral Lower Leg Performed By: Physician Lenda Kelp, PA Debridement Type: Debridement Severity of Tissue Pre Debridement: Fat layer exposed Level of Consciousness (Pre-procedure): Awake and Alert Pre-procedure Verification/Time Out Yes - 11:29 Taken: Start Time: 11:29 Pain Control: Lidocaine 4% T opical Solution Percent of Wound Bed Debrided: 100% T Area Debrided (cm): otal 76.62 Tissue and other material debrided: Non-Viable, Slough, Slough Level: Non-Viable Tissue Debridement Description: Selective/Open Wound Instrument: Curette Bleeding: Minimum Hemostasis Achieved: Pressure End Time: 11:31 Procedural Pain: 0 Post  Procedural Pain: 0 Response to Treatment: Procedure was tolerated well Level of Consciousness (Post- Awake and Alert procedure): Post Debridement Measurements of Total Wound Length: (cm) 12.2 Width: (cm) 8 Depth: (cm) 0.2 Volume: (cm) 15.331 Character of Wound/Ulcer Post Debridement: Improved Jamie [name], Jamie [name] ([id number]) [id number] Page 2 of 4 Severity of Tissue Post Debridement: Fat layer exposed Post Procedure Diagnosis Same as Pre-procedure Notes Scribed for Allen Derry III PA-C, by [employer]) Unsigned Entered By: Karie Schwalbe on 11/06/2022 11:39:21 -------------------------------------------------------------------------------- Physician Orders Details Patient Name: Date of Service: Jamie Krill. 11/06/2022 10:15 A M Medical Record Number: [id number] Patient Account Number: [id number] Date of Birth/Sex: Treating RN: [birth year] (82 y.o. 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 Leonard Schwartz Wednesday Room 8 or 9 weekly 11/13/22 at 10:15am Return A ppointment in 2 weeks. Allen Derry PA-C Wednesday 11/20/2022 at 10:15am Return A Return appointment in 3 weeks. Allen Derry PA-C Room 8 or 9 11/27/22 at 10:15am Other: - home health to wash right leg and foot with soap (dial soap) and  water with dressing changes. Patient to have soap and towels next to her when Home Health arrives at the house. *****[facility] compounding topical antibiotics. FOLLOW THE PHARMACY INSTRUCTIONS ON HOW TO MIX, and apply under the hydrofera blue.****** ****PLEASE APPLY ZINC OXIDE as needed AROUND THE PERIWOUND DUE TO MACERATION. ****Patient to bring in the [facility] topical antibiotics to each wound care appt as well.***** *****HOME HEALTH TO PLEASE WRAP FROM BASE OF TOES TO JUST BELOW KNEE. please appy first layer of unna boot  at the bast of the toes and just below the knee to help hold the compression wrap in place.**** Anesthetic Wound #5 Right,Lateral Lower Leg (In clinic) Topical Lidocaine 5% applied to wound bed (In clinic) Topical Lidocaine 4% applied to wound bed Bathing/ Shower/ Hygiene May shower and wash wound with soap and water. - with dressing changes. Edema Control - Lymphedema / SCD / Other Right Lower Extremity Elevate legs to the level of the heart or above for 30 minutes daily and/or when sitting for 3-4 times a day throughout the day. Avoid standing for long periods of time. Exercise regularly Home Health Wound #5 Right,Lateral Lower Leg No change in wound care orders this week; continue Home Health for wound care. May utilize formulary equivalent dressing for wound treatment orders unless otherwise specified. Other Home Health Orders/Instructions: - [facility] in [place], [place] fax # [phone]. Jamie [name], Jamie [name] ([id number]) [id number] Page 3 of 4 Wound Treatment Wound #5 - Lower Leg Wound Laterality: Right, Lateral Cleanser: Soap and Water 3 x Per Week/30 Days Discharge Instructions: May shower and wash wound with dial antibacterial soap and water prior to dressing change. Peri-Wound Care: Zinc Oxide Ointment 30g tube 3 x Per Week/30 Days Discharge Instructions: Apply Zinc Oxide to periwound with each dressing  change TO ANY MACERATED SKIN. Peri-Wound Care: Sween Lotion (Moisturizing lotion) 3 x Per Week/30 Days Discharge Instructions: Apply moisturizing lotion as directed Topical: compounding topical antibiotics 3 x Per Week/30 Days Discharge Instructions: apply directly to wound bed under the hydrofera blue. Home Health start. MIX ACCORDING TO THE PHARMACY INSTRUCTIONS. Prim Dressing: Hydrofera Blue Ready Transfer Foam, 4x5 (in/in) (Generic) 3 x Per Week/30 Days ary Discharge Instructions: Apply over the topical antibiotics wound bed as instructed Secondary Dressing: ABD Pad, 8x10 3 x Per Week/30 Days Discharge Instructions: Apply over primary dressing as directed. Secondary Dressing: Woven Gauze Sponge, Non-Sterile 4x4 in 3 x Per Week/30 Days Discharge Instructions: Apply over primary dressing as directed. Secondary Dressing: Zetuvit Plus 4x8 in 3 x Per Week/30 Days Discharge Instructions: Or double ABD pad Apply over primary dressing as directed. Compression Wrap: Kerlix Roll 4.5x3.1 (in/yd) 3 x Per Week/30 Days Discharge Instructions: Apply Kerlix and Coban compression as directed. Compression Wrap: Coban Self-Adherent Wrap 4x5 (in/yd) 3 x Per Week/30 Days Discharge Instructions: Apply over Kerlix as directed. Compression Wrap: unna boot FIRST LAYER **** SEE INSTRUCTIONS 3 x Per Week/30 Days Discharge Instructions: APPLY FIRST LAYER UNNA BOOT AT BASES OF TOES AND JUST BELOW THE KNEE TO HOLD COMPRESSION WRAP IN PLACE. Electronic Signature(s) Unsigned Entered By: Karie Schwalbe on 11/06/2022 11:39:02 -------------------------------------------------------------------------------- Problem List Details Patient Name: Date of Service: Jamie [name], [name]. 11/06/2022 10:15 A M Medical Record Number: [id number] Patient Account Number: [id number] Date of Birth/Sex: Treating RN: 01-Mar-1941 (82 y.o. F) Primary Care Provider: [name] Other Clinician: Referring Provider: Treating  Provider/Extender: [name], [name] in Treatment: 24 Active Problems ICD-10 Encounter Code Description Active Date MDM Diagnosis I87.331 Chronic venous hypertension (idiopathic) with ulcer and inflammation of right 05/22/2022 No Yes lower extremity L97.812 Non-pressure chronic ulcer of other part of right lower leg with fat layer 05/22/2022 No Yes exposed Jamie [name], Jamie [name] ([id number]) [id number] Page 4 of 4 [phone] Non-pressure chronic ulcer of other part of left lower leg with fat layer exposed2/21/2024 No Yes I25.10 Atherosclerotic heart disease of native coronary artery without angina pectoris 05/22/2022 No Yes Inactive Problems Resolved Problems Electronic Signature(s) Signed: 11/06/2022 10:30:26 AM By: Allen Derry PA-C  Entered By: Allen Derry on 11/06/2022 10:30:25

## 2022-11-08 NOTE — Progress Notes (Signed)
Jamie, [name] ([id number]) [id number] Page 1 of 6 Visit Report for 11/06/2022 Arrival Information Details Patient Name: Date of Service: Jamie [name], [name]. 11/06/2022 10:15 A M Medical Record Number: [id number] Patient Account Number: [id number] Date of Birth/Sex: Treating RN: Aug 10, 1940 (82 y.o. F) Primary Care Jamie [name]: Jamie [name] Other Clinician: Referring Jamie [name]: Treating Jamie [name]/Extender: Jamie [name], Jamie [name] in Treatment: [name] Visit Information History Since Last Visit Added or deleted any medications: No Patient Arrived: Wheel Chair Any new allergies or adverse reactions: No Arrival Time: 10:25 Had a fall or experienced change in No Accompanied By: niece activities of daily living that may affect Transfer Assistance: None risk of falls: Patient Identification Verified: Yes Signs or symptoms of abuse/neglect since last visito No Secondary Verification Process Completed: Yes Hospitalized since last visit: No Patient Requires Transmission-Based Precautions: No Implantable device outside of the clinic excluding No Patient Has Alerts: No cellular tissue based products placed in the center since last visit: Has Dressing in Place as Prescribed: Yes Has Compression in Place as Prescribed: Yes Pain Present Now: Yes Electronic Signature(s) Signed: 11/08/2022 11:53:17 AM By: Jamie [name] Entered By: Jamie [name] on 11/06/2022 10:25:55 -------------------------------------------------------------------------------- Encounter Discharge Information Details Patient Name: Date of Service: Jamie [name], [name] 11/06/2022 10:15 A M Medical Record Number: [id number] Patient Account Number: [id number] Date of Birth/Sex: Treating RN: 23-Dec-1940 ([name] y.o. [name] Primary Care Jamie [name]: Jamie [name] Other Clinician: Referring Jamie [name]: Treating Kaylanni Ezelle/Extender: Jamie [name], Jamie [name] in Treatment: [name] Encounter Discharge Information Items Post Procedure Vitals Discharge Condition: Stable Temperature (F): 98.8 Ambulatory Status: Wheelchair Pulse (bpm): 58 Discharge Destination: Home Respiratory Rate (breaths/min): 18 Transportation: Private Auto Blood Pressure (mmHg): 129/74 Accompanied By: daughter Schedule Follow-up Appointment: Yes Clinical Summary of Care: Electronic Signature(s) Signed: 11/06/2022 6:06:03 PM By: Jamie Schwalbe RN Entered By: Jamie [name] on 11/06/2022 17:23:23 Jamie [name] ([id number]) [id number] Page 2 of 6 -------------------------------------------------------------------------------- Lower Extremity Assessment Details Patient Name: Date of Service: Jamie [name], [name]. 11/06/2022 10:15 A M Medical Record Number: [id number] Patient Account Number: [id number] Date of Birth/Sex: Treating RN: 1940-05-04 (82 y.o. F) Primary Care Jamie [name]: Jamie [name] Other Clinician: Referring Jamie [name]: Treating Jamie [name]/Extender: Jamie [name], Jamie [name] in Treatment: [name] Edema Assessment Assessed: [Left: No] [Right: No] Edema: [Left: Ye] [Right: s] Calf Left: Right: Point of Measurement: From Medial Instep 39.2 cm Ankle Left: Right: Point of Measurement: From Medial Instep 25.2 cm Vascular Assessment Extremity colors, hair growth, and conditions: Extremity Color: [Right:Normal] Hair Growth on Extremity: [Right:No] Temperature of Extremity: [Right:Warm] Capillary Refill: [Right:< 3 seconds] Dependent Rubor: [Right:No No] Electronic Signature(s) Signed: 11/08/2022 11:53:17 AM By: Jamie [name] Entered By: Jamie [name] on 11/06/2022 10:47:43 -------------------------------------------------------------------------------- Multi-Disciplinary Care Plan Details Patient Name: Date of Service: Jamie [name], [name] 11/06/2022 10:15 A  M Medical Record Number: [id number] Patient Account Number: [id number] Date of Birth/Sex: Treating RN: Jamie [name], 1942 (82 y.o. [name] Primary Care Valeriano Bain: Jamie [name] Other Clinician: Referring Vamsi Apfel: Treating Siddh Vandeventer/Extender: Jamie [name] [name] in Treatment: [name] Multidisciplinary Care Plan reviewed with physician Active Inactive Pain, Acute or Chronic Nursing Diagnoses: Pain Management - Cyclic Acute (Dressing Change Related) Pain Management - Non-cyclic Acute (Procedural) Pain, acute or chronic: actual or potential Goals: Patient will verbalize adequate pain control and receive pain control interventions during procedures as needed Jamie, [name] ([id number]) [phone].pdf Page 3 of 6 Date Initiated: 09/11/2022 Target Resolution Date: 02/06/2023 Goal Status: Active Patient/caregiver will verbalize adequate pain  control between visits Date Initiated: 09/11/2022 Target Resolution Date: 02/06/2023 Goal Status: Active Patient/caregiver will verbalize comfort level met Date Initiated: 09/11/2022 Target Resolution Date: 02/06/2023 Goal Status: Active Interventions: Complete pain assessment as per visit requirements Encourage patient to take pain medications as prescribed Provide education on pain management Provision of support: recognize patient pain, provide comfort and support as needed Reposition patient for comfort Treatment Activities: Administer pain control measures as ordered : 09/11/2022 Notes: Electronic Signature(s) Signed: 11/06/2022 6:06:03 PM By: Jamie Schwalbe RN Entered By: Jamie [name] on 11/06/2022 17:21:07 -------------------------------------------------------------------------------- Pain Assessment Details Patient Name: Date of Service: Jamie [name], [name] 11/06/2022 10:15 A M Medical Record Number: [id number] Patient Account Number: [id number] Date of Birth/Sex: Treating  RN: [birth year] (82 y.o. F) Primary Care Hansford Hirt: Jamie [name] Other Clinician: Referring Akeila Lana: Treating Shaira Sova/Extender: Jamie [name], Jamie [name] in Treatment: [name] Active Problems Location of Pain Severity and Description of Pain Patient Has Paino Yes Site Locations Pain Location: Pain in Ulcers Rate the pain. Current Pain Level: 8 Pain Management and Medication Current Pain Management: Electronic Signature(s) Signed: 11/08/2022 11:53:17 AM By: Jamie [name] Entered By: Jamie [name] on 11/06/2022 10:47:26 Jamie [name] ([id number]) [phone].pdf Page 4 of 6 -------------------------------------------------------------------------------- Patient/Caregiver Education Details Patient Name: Date of Service: Jamie [name], [place] 7/31/2024andnbsp10:15 A M Medical Record Number: [id number] Patient Account Number: [id number] Date of Birth/Gender: Treating RN: Sep 04, 1940 (82 y.o. [name] Primary Care Physician: Jamie [name] Other Clinician: Referring Physician: Treating Physician/Extender: Jamie [name], Billey Gosling in Treatment: [name] Education Assessment Education Provided To: Patient Education Topics Provided Wound/Skin Impairment: Methods: Explain/Verbal Responses: Return demonstration correctly Electronic Signature(s) Signed: 11/06/2022 6:06:03 PM By: Jamie Schwalbe RN Entered By: Jamie [name] on 11/06/2022 17:21:34 -------------------------------------------------------------------------------- Wound Assessment Details Patient Name: Date of Service: Jamie [name]. 11/06/2022 10:15 A M Medical Record Number: [id number] Patient Account Number: [id number] Date of Birth/Sex: Treating RN: 06/13/1940 (82 y.o. F) Primary Care Krissy Orebaugh: Jamie [name] Other Clinician: Referring Drako Maese: Treating Navina Wohlers/Extender: Jamie [name], Jamie [name] in Treatment: [name] Wound Status Wound Number: 5 Primary Venous Leg Ulcer Etiology: Wound Location: Right, Lateral Lower Leg Wound Open Wounding Event: Trauma Status: Date Acquired: 04/17/2022 Comorbid Sleep Apnea, Hypertension, Peripheral Venous Disease, [name] Of Treatment: [name] History: Osteoarthritis, Neuropathy Clustered Wound: Yes Photos Wound Measurements Length: (cm) Width: (cm) Jamie [name], Jamie [name] ([id number]) Depth: (cm) Clustered Quantity: Area: (cm) Volume: (cm) 12.2 % Reduction in Area: -192.9% 8 % Reduction in Volume: -95.3% [phone].pdf Page 5 of 6 0.2 Epithelialization: Small (1-33%) 4 Tunneling: No 76.655 Undermining: No 15.331 Wound Description Classification: Full Thickness Without Exposed Supp Wound Margin: Distinct, outline attached Exudate Amount: Medium Exudate Type: Serosanguineous Exudate Color: red, brown ort Structures Foul Odor After Cleansing: No Slough/Fibrino Yes Wound Bed Granulation Amount: Medium (34-66%) Exposed Structure Granulation Quality: Red Fascia Exposed: No Necrotic Amount: Medium (34-66%) Fat Layer (Subcutaneous Tissue) Exposed: Yes Necrotic Quality: Adherent Slough Tendon Exposed: No Muscle Exposed: No Joint Exposed: No Bone Exposed: No Periwound Skin Texture Texture Color No Abnormalities Noted: No No Abnormalities Noted: No Callus: No Atrophie Blanche: No Crepitus: No Cyanosis: No Excoriation: No Ecchymosis: No Induration: No Erythema: No Rash: No Hemosiderin Staining: Yes Scarring: Yes Mottled: No Pallor: No Moisture Rubor: No No Abnormalities Noted: No Dry / Scaly: Yes Temperature / Pain Maceration: No Temperature: No Abnormality Tenderness on Palpation: Yes Treatment Notes Wound #5 (Lower Leg) Wound Laterality: Right, Lateral Cleanser Soap and Water Discharge Instruction: May shower and  wash wound with dial antibacterial soap and water  prior to dressing change. Peri-Wound Care Zinc Oxide Ointment 30g tube Discharge Instruction: Apply Zinc Oxide to periwound with each dressing change TO ANY MACERATED SKIN. Sween Lotion (Moisturizing lotion) Discharge Instruction: Apply moisturizing lotion as directed Topical compounding topical antibiotics Discharge Instruction: apply directly to wound bed under the hydrofera blue. Home Health start. MIX ACCORDING TO THE PHARMACY INSTRUCTIONS. Primary Dressing Hydrofera Blue Ready Transfer Foam, 4x5 (in/in) Discharge Instruction: Apply over the topical antibiotics wound bed as instructed Secondary Dressing ABD Pad, 8x10 Discharge Instruction: Apply over primary dressing as directed. Woven Gauze Sponge, Non-Sterile 4x4 in Discharge Instruction: Apply over primary dressing as directed. Zetuvit Plus 4x8 in Discharge Instruction: Or double ABD pad Apply over primary dressing as directed. Secured With Compression Wrap Kerlix Roll 4.5x3.1 (in/yd) Discharge Instruction: Apply Kerlix and Coban compression as directed. [address] 4x5 (in/yd) Jamie [name], Jamie [name] ([id number]) [id number] Page 6 of 6 Discharge Instruction: Apply over Kerlix as directed. unna boot FIRST LAYER **** SEE INSTRUCTIONS Discharge Instruction: APPLY FIRST LAYER UNNA BOOT AT BASES OF TOES AND JUST BELOW THE KNEE TO HOLD COMPRESSION WRAP IN PLACE. Compression Stockings Add-Ons Electronic Signature(s) Signed: 11/08/2022 11:53:17 AM By: Jamie [name] Entered By: Jamie [name] on 11/06/2022 10:58:38 -------------------------------------------------------------------------------- Vitals Details Patient Name: Date of Service: Jamie Brink, [place] [name] 11/06/2022 10:15 A M Medical Record Number: [id number] Patient Account Number: [id number] Date of Birth/Sex: Treating RN: 03-13-1941 (82 y.o. F) Primary Care Hue Frick: Jamie [name] Other Clinician: Referring  Shahzain Kiester: Treating Kealan Buchan/Extender: Jamie [name], Jamie [name] in Treatment: [name] Vital Signs Time Taken: 10:46 Temperature (F): 98.8 Height (in): 62 Pulse (bpm): 58 Weight (lbs): 171 Respiratory Rate (breaths/min): 18 Body Mass Index (BMI): 31.3 Blood Pressure (mmHg): 129/74 Reference Range: 80 - 120 mg / dl Airway Pulse Oximetry (%): 96 Electronic Signature(s) Signed: 11/08/2022 11:53:17 AM By: Jamie [name] Entered By: Jamie [name] on 11/06/2022 10:46:29

## 2022-11-13 ENCOUNTER — Ambulatory Visit (HOSPITAL_BASED_OUTPATIENT_CLINIC_OR_DEPARTMENT_OTHER): Payer: Medicare HMO | Admitting: Physician Assistant

## 2022-11-14 ENCOUNTER — Ambulatory Visit (HOSPITAL_BASED_OUTPATIENT_CLINIC_OR_DEPARTMENT_OTHER): Payer: Medicare HMO | Admitting: Internal Medicine

## 2022-11-20 ENCOUNTER — Encounter (HOSPITAL_BASED_OUTPATIENT_CLINIC_OR_DEPARTMENT_OTHER): Payer: Medicare HMO | Attending: Physician Assistant | Admitting: Physician Assistant

## 2022-11-20 DIAGNOSIS — L97822 Non-pressure chronic ulcer of other part of left lower leg with fat layer exposed: Secondary | ICD-10-CM | POA: Insufficient documentation

## 2022-11-20 DIAGNOSIS — I2510 Atherosclerotic heart disease of native coronary artery without angina pectoris: Secondary | ICD-10-CM | POA: Diagnosis not present

## 2022-11-20 DIAGNOSIS — L97812 Non-pressure chronic ulcer of other part of right lower leg with fat layer exposed: Secondary | ICD-10-CM | POA: Diagnosis not present

## 2022-11-20 DIAGNOSIS — I87331 Chronic venous hypertension (idiopathic) with ulcer and inflammation of right lower extremity: Secondary | ICD-10-CM | POA: Diagnosis present

## 2022-11-20 NOTE — Progress Notes (Addendum)
[name], [name] ([id number]) [id number] Page 1 of 11 Visit Report for 11/20/2022 Chief Complaint Document Details Patient Name: Date of Service: Jamie [name], [name]. 11/20/2022 10:15 A M Medical Record Number: [id number] Patient Account Number: [id number] Date of Birth/Sex: Treating RN: 01-15-1941 (82 y.o. F) Primary Care Provider: Delorise [name] Other Clinician: Referring Provider: Treating Provider/Extender: Jamie [name], Jamie [name] in Treatment: 26 Information Obtained from: Patient Chief Complaint Bilateral LE Ulcers Electronic Signature(s) Signed: 11/20/2022 10:01:53 AM By: Jamie Derry PA-C Entered By: Jamie [name] on 11/20/2022 07:01:52 -------------------------------------------------------------------------------- Debridement Details Patient Name: Date of Service: Jamie [name], Harrell Gave [name]. 11/20/2022 10:15 A M Medical Record Number: [id number] Patient Account Number: [id number] Date of Birth/Sex: Treating RN: 05-02-1940 (82 y.o. [name] Primary Care Provider: Delorise [name] Other Clinician: Referring Provider: Treating Provider/Extender: Jamie [name], Jamie [name] in Treatment: 26 Debridement Performed for Assessment: Wound #5 Right,Lateral Lower Leg Performed By: Physician Jamie Kelp, PA Debridement Type: Debridement Severity of Tissue Pre Debridement: Fat layer exposed Level of Consciousness (Pre-procedure): Awake and Alert Pre-procedure Verification/Time Out Yes - 10:51 Taken: Start Time: 10:51 Pain Control: Lidocaine 4% T opical Solution Percent of Wound Bed Debrided: 50% T Area Debrided (cm): otal 38.62 Tissue and other material debrided: Viable, Non-Viable, Slough, Subcutaneous, Slough Level: Skin/Subcutaneous Tissue Debridement Description: Excisional Instrument: Curette Bleeding: Minimum Hemostasis Achieved: Pressure End Time: 10:53 Procedural  Pain: 0 Post Procedural Pain: 0 Response to Treatment: Procedure was tolerated well Level of Consciousness (Post- Awake and Alert procedure): Post Debridement Measurements of Total Wound Length: (cm) 12.3 Width: (cm) 8 Depth: (cm) 0.2 Volume: (cm) 15.457 Character of Wound/Ulcer Post Debridement: Improved Jamie, [name] ([id number]) [id number] Page 2 of 11 Severity of Tissue Post Debridement: Fat layer exposed Post Procedure Diagnosis Same as Pre-procedure Notes Scribed for Jamie [name] III PA-C, by Jamie [name]) Signed: 11/20/2022 5:04:24 PM By: Jamie Derry PA-C Signed: 11/20/2022 5:18:59 PM By: Jamie Schwalbe RN Entered By: Jamie [name] on 11/20/2022 07:55:05 -------------------------------------------------------------------------------- HPI Details Patient Name: Date of Service: Jamie [name], Jamie [name]. 11/20/2022 10:15 A M Medical Record Number: [id number] Patient Account Number: [id number] Date of Birth/Sex: Treating RN: 02-11-1941 (82 y.o. F) Primary Care Provider: Delorise [name] Other Clinician: Referring Provider: Treating Provider/Extender: Jamie [name], Jamie [name] in Treatment: 26 History of Present Illness HPI Description: ADMISSION 06/30/2020 Jamie [name] is an 82 year old woman who lives in [place]. She is here with her niece for review of wounds on the left medial lower leg and ankle. These have apparently been present for over a year and she followed with Jamie [name] at the [facility] in [place] for quite a period of time although it looks as though there was a initial consult wound from Jamie [name] on February 18 presumably there was therefore hiatus. At that point the wounds were described as being there for 3 months. She also tells me she was at the wound care center in [address] for a period of time with this. 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 She had venous studies that were negative for DVT ABIs on the right were 1.01 on the left 1.06. She has . had previous applications of puraply, compression which she does not tolerate very well. She has had several rounds of oral antibiotic therapy. She complains of unrelenting pain and she is seeing Jamie [name] of pain management apparently was on oxycodone but that did not help. She  also had a skin biopsy done by Jamie [name] although we do not have that result. She does not appear to have an arterial issue. I am not completely clear what she has been putting on the wounds lately. 3/31; this patient has a particularly nasty set of wounds on the left medial ankle in the middle of what looks to be hemosiderin deposition secondary to chronic venous insufficiency. She has a lot of pain followed by pain management. Jamie [name] apparently did a biopsy of something on the left leg last fall what I would like to get this result. She has a history of MRSA treatment. I do not believe she had reflux studies but she did have DVT rule out studies. Previous ABIs have not suggested arterial insufficiency 4/7; difficult wounds on the left medial ankle probably chronic venous insufficiency. With considerable effort on behalf of our case manager we were able to finally to speak to somebody at the hospital in [place] who indicated that no biopsy of this area have been done even though the patient describes this in some detail and is even able to point out where she thinks the biopsy was done. We have been using Sorbact. The PCR culture I did showed polymicrobial identification with Pseudomonas, staph aureus, Peptostreptococcus. All of this and low titers. Resistance genes identified were MRSA, staph virulence gene and tetracycline. We are going to send this to [facility] for a topical antibiotic which is something that we have had good success with recently in large venous ulcers with a lot of  purulent drainage. There would not be an easy oral alternative here possibly line escalated and ciprofloxacin if we need to use systemic antibiotic 4/15; difficult area on the left medial ankle. Most likely chronic venous insufficiency. I think she will probably need venous reflux study I think she had DVT rule outs but not venous reflux studies. We have not yet obtained the topical antibiotics. She has home health changing the dressing we have been using Sorbact for adherent fibrinous debris on the surface. Very difficult to remove 4/22; patient presents for 1 week follow-up. She has been using sore back under compression wraps and these are changed 3 times a week with home health. She also had Keystone antibiotics sent to her house and brought them in today. She has no complaints or issues today. 5/2; patient is here for follow-up. She has been using Sorbact under compression. Very painful wound. She has been using Keystone antibiotics. Not much improvement although the more medial part of the wound has cleaned up nicely and the larger part of the wound about 50% slough covered. Part of the issue here is that she had stays at both [facility] wound care center, [facility] wound care center and now [place]. Not sure if she has had venous reflux studies. As far as we are able to tell she did not have a biopsy. My notes state that she did not have venous reflux studies just DVT rule outs. 5/16; patient goes for venous reflux studies this afternoon. She says that wound was biopsied which sounds like punch biopsies by Dr. Lynden Ang we do not have these results. We are using Sorbact. Very difficult wound to debride 5/23; patient presents for 1 week follow-up. She reports tolerating the wraps well with sorbact underneath. She had ABIs and venous reflux studies done. She has no issues or complaints today. She denies acute signs of infection. 6/6; I have reviewed the patient's vascular studies. Wounds are on the left  medial and posterior calf. She had significant reflux in the greater saphenous vein in the in the mid thigh, distal thigh knee and the small saphenous vein in the popliteal fossa. The vein diameters do not look too impressive though. She is going to see the vascular surgeon on Wednesday. She also had venous reflux in the right common femoral vein. She did not have any evidence of a DVT or SVT I am wondering whether there is an ablation procedure that would benefit her in the greater saphenous vein on the left. . She tells [place] that home health put the dressing on too tight and she took off 1 layer. The swelling in her left leg is a little worse as a result of this. Not much change in the wound measurements so the surface of the wound looks better Jamie [name], Jamie [name] ([id number]) [id number] Page 3 of 11 Her arterial studies showed an ABI on the right of 1.14 with a triphasic waveform and a great toe pressure of 0.89. On the left her ABIs were noncompressible at 1.34 but with triphasic waveforms and a TBI of 0.97. Her greater toe pressure was 122. There was no evidence of significant bilateral arterial disease 6/20 patient went to see Dr. Durwin Nora. He did not think she had significant arterial disease. In terms of her venous duplex on the right side there was no evidence of a DVT or SVT there was deep venous reflux involving the common femoral vein no superficial vein reflux on the left side there was no DVT or SVT there was no deep vein graft reflux there was some reflux in the greater saphenous vein from the mid thigh to the knee but the vein here was not dilated. He thought these were venous wounds he prescribed a compression pump but I am not sure who we ordered it from The patient has been approved for Apligraf. Still using silver collagen this week 7/5; Apligraf #1 7/19 Apligraf #2. Decent improvement in the condition of the wound bed. Epithelialization distal 8/2  Apligraf #3. No issues or complaints. Denies signs of infection. 8/17 Apligraf #4. No issues or concerns. Some complaints of pruritus and the rash. Our intake nurse brought up the fact that she had previously indicated possible cotton layer sensitivity we will therefore use kerlix in the bottom layer of the compression 8/30; the patient comes in with the area on her left medial leg just about healed. There is a superficial area more towards the tibia and a smaller open area distally everything else is epithelialized. I do not think she requires another Apligraf 9/13; left medial leg is healed. 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 Her edema control is good Readmisstion: 05-22-2022 upon evaluation today patient presents for initial inspection here in our clinic concerning issues that she has been having with a wound of the right lateral lower extremity. This is an area of a previous skin graft she tells me. With that being said she unfortunately had a scrape on this that occurred around 10 January. Since that time she has noted that this has just continued to get bigger and bigger in her niece who is present with her today actually states that 2 [name] ago when she saw that it was significantly smaller than what she sees currently. Obviously this is of utmost concern as we do not want this to continue to get larger when arrested and get moving in the right direction. Fortunately there  does not appear to be any signs of systemic infection though locally I think we probably do have some infection present. I would obtain a culture to see what we have going on here and then we will subsequently see where things go going forward. Fortunately I think that she is in the right place at this point being at the wound center we can definitely do something to try to get this moving in the right direction. Patient does have a history of chronic venous  insufficiency as well as coronary artery disease. In the past she has done well with compression wraps on the start with a 3 layer wrap I think she is probably can end up going to a 4-layer at some point but we will see how things do over the next week. She has been using wound cleanser along with she tells me a zinc and topical but again I am not sure exactly what that was. 05-29-2022 upon evaluation today patient appears to be doing well currently in regard to her wound. This actually is showing signs of improvement I am happy in that regard unfortunately her left leg has an area on the shin that has opened. This is the region where one of the areas at least we have previously taken care of. Nonetheless this is small hoping we get it under control before things worsen significantly. 06-05-2022 upon evaluation today patient appears to be doing well currently in regard to her wounds. The actually seem to be fairly clean she is having still quite a bit of problem with pain at this point she would like to not have debridement today for all possible. With that being said I do believe that we are making some good progress I think the [facility] is doing a decent job here. 3/6; patient has had 3/6; patient with known severe chronic venous insufficiency. She apparently had a traumatic wound at home and has a reasonably substantial wound on the right lateral lower leg and a smaller one on the left anterior lower leg as well. We have been using Hydrofera Blue and Unna boots 3/13; patient presents for follow-up. We have been using Hydrofera Blue under Coflex to the legs bilaterally. She has no issues or complaints today. 06-26-2022 upon evaluation today patient appears to be doing well currently in regard to her wound. This is measuring a little bit larger however. Fortunately I do not see any signs of infection this is on the right side. Fortunately the left side is actually completely healed which is great  news. 07-03-2022 patient's wound unfortunately is continuing to show signs of being worse. Her compression which was the Tubigrip that I thought should be able to pull up actually slipped down and she was not able to pull that back up. With that being said that means that unfortunately her wound has continued to deteriorate since I last saw her. This is definitely not the direction that we are looking for. I discussed with her that I do believe we need to go ahead and see about getting her started on antibiotics and I subsequently would also like to go ahead and see about getting things moving forward with regard to a wound culture and making a change up her dressings as well but this can be changed more frequently than just once a week. 07-10-2022 upon evaluation today patient actually appears to be doing much better. I do believe that the antibiotics have been beneficial for her. Fortunately I do not see  any signs of active infection locally nor systemically which is great news. No fevers, chills, nausea, vomiting, or diarrhea. 07-17-2022 upon evaluation today patient appears to be doing well currently in regard to her wound which is actually showing signs of improvement this is slow but nonetheless prevalent that we are seeing good improvement here. Fortunately I do not see any signs of active infection locally nor systemically which is great news. 07-24-2022 upon evaluation today patient appears to be doing well currently in regard to her wound although the wrap that we had on has been slipping down and she really does not have anybody to help her with this at home health is not going to be coming out we could not get anybody that would actually be able to do the dressing changes. Fortunately I do not see any signs of active infection locally nor systemically which is great news. 08-07-22 poorly in regard to her leg compared to what it was previous. Fortunately there does not appear to be any signs of  active infection locally nor systemically which is great news. No fevers, chills, nausea, vomiting, or diarrhea. With that being said it does appear that the patient may have some cellulitis in the leg which is not good. 08-14-2022 upon evaluation today patient appears to be doing somewhat better in regard to her wounds she still is having quite a bit of pain we are still not where we want to be as far as healing is concerned completely. Fortunately I do not see any evidence of active infection locally nor systemically which is great news and I am very pleased in that regard. 08-21-23 upon evaluation today patient appears to be doing poorly currently in regard to her wound. She has been tolerating the dressing changes without complication. Fortunately there does not appear to be any signs of active infection locally nor systemically at this time. 08-28-2022 upon evaluation today patient appears to be doing poorly in regard to her wound this was not wrapped appropriately home health did not go up high enough on the wrap. This has caused some issues and I discussed that with the patient today. I do believe that she is going to require aggressive wrapping and treatment which she is getting the [facility] topical antibiotics today they can start using that at the next wrap on Friday. In the meantime they need to make sure that the rapid and appropriately we wrote very specific orders today. 09-04-2022 upon evaluation today patient appears to be doing well currently in regard to her wound which I think is making progress is still hurting her quite significantly. Fortunately I do not see any signs of active infection locally or systemically which is great news. No fevers, chills, nausea, vomiting, or diarrhea. 09-11-2022 upon evaluation today patient's wound actually is showing signs of being a little bit smaller and looking a little bit better she still has a lot going on here however. Fortunately I do not see any  evidence of active infection Worsening locally nor systemically which is great news. With that being said worsening still continue to use the topical Keystone antibiotics. 09-18-2022 upon evaluation today patient actually showing some signs of improvement. I am actually very pleased with where we stand compared to where we have been. I think that she is making good headway here. She is still having quite a bit of pain but it seems to be lessening compared to previous. Jamie [name], Jamie [name] ([id number]) [id number] Page 4 of 11 09-25-2022 upon evaluation patient's  wound actually showing signs slowly but surely of improvement. Fortunately I do not see any signs of active infection at this time systemically and locally I feel like this is dramatically improved. She is doing well with the [facility] topical antibiotics. 10-02-2022 upon evaluation today patient appears to be doing better in regard to her wound overall. I am very pleased with where things stand from a visual standpoint I do not see any signs of active infection and in general I think that we are moving in the right direction here. 10-09-2022 upon evaluation today patient appears to be doing well currently in regard to her wound. She has been tolerating the dressing changes without complication. Fortunately I do not see any signs of active infection at this time which is great news. 10-16-2022 upon evaluation today patient continues to have quite a bit of pain in regard to her right leg. We have been attempting to debride little by little as we could but again was pretty limited by the fact that she is having significant discomfort. We do not actually have any signs of active infection going on at this point that the [facility] topical antibiotics have been helping quite readily. I been attempting to do as much debridement as I can but if she were to have pain medication this would actually help and in the past when she did it  was much more tolerable for her as far as taking the edge off. Right now she has been without as she is in transition from her previous pain management physician to someone new to manage this. 10-23-2022 upon evaluation patient appears to be doing excellent in regard to her wound. She has been tolerating the dressing changes without complication. Fortunately I do not see any evidence of active infection locally nor systemically which is great news. No fevers, chills, nausea, vomiting, or diarrhea. 7/24; patient with a wound secondary to chronic venous insufficiency on the right lateral lower leg. We have been using Keystone antibiotic Hydrofera Blue under kerlix Coban. She complains of a lot of pain after last week's debridement otherwise things appear to be improved per discussion with our intake nurse. 11-06-2022 upon evaluation today patient appears to be doing excellent at this point in regard to her wound. She has been tolerating the dressing changes without complication and to be honest she is making excellent progress towards complete closure. I am actually very pleased with where we stand I think that she is doing excellent as far as the overall appearance of the wound is concerned as well. She is going require some debridement however. 11-20-2022 upon evaluation today patient appears to be doing well currently in regard to her wound. She has been tolerating the dressing changes without complication. Fortunately there does not appear to be any signs of active infection locally or systemically which is great news and in general I do believe that we are moving in the right direction here. No fevers, chills, nausea, vomiting, or diarrhea. Electronic Signature(s) Signed: 11/20/2022 11:17:15 AM By: Jamie Derry PA-C Entered By: Jamie [name] on 11/20/2022 08:17:15 -------------------------------------------------------------------------------- Physical Exam Details Patient Name: Date of Service: Jamie Krill. 11/20/2022 10:15 A M Medical Record Number: [id number] Patient Account Number: [id number] Date of Birth/Sex: Treating RN: June 26, 1940 (82 y.o. F) Primary Care Provider: Delorise [name] Other Clinician: Referring Provider: Treating Provider/Extender: Jamie [name], Jamie [name] in Treatment: 26 Constitutional Well-nourished and well-hydrated in no acute distress. Respiratory normal breathing without difficulty. Psychiatric this patient is  able to make decisions and demonstrates good insight into disease process. Alert and Oriented x 3. pleasant and cooperative. Notes Upon inspection patient's wound bed actually showed signs of good granulation and epithelization at this point. Fortunately I do not see any signs of infection or worsening overall and I do believe that the patient is making excellent headway towards complete closure which is great news. Nonetheless I think with her drainage slowing down quite significantly at this point it is a good time to switch over to a silver collagen dressing to see how this will do. Electronic Signature(s) Signed: 11/20/2022 11:18:13 AM By: Jamie Derry PA-C Entered By: Jamie [name] on 11/20/2022 08:18:13 Physician Orders Details -------------------------------------------------------------------------------- [name] ([id number]) [id number] Page 5 of 11 Patient Name: Date of Service: Jamie [name], [name]. 11/20/2022 10:15 A M Medical Record Number: [id number] Patient Account Number: [id number] Date of Birth/Sex: Treating RN: 1940-07-15 (82 y.o. 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 Leonard Schwartz Wednesday Room 8 or 9 weekly 11/27/22 at 10:15am Return A ppointment in 2 [name]. Leonard Schwartz Wednesday Room 8 or 9 weekly 12/04/22 at 10:15am Return A Other: - home health to wash right leg and foot with soap (dial soap) and water with dressing changes. Patient to have soap and towels next to her when Home Health arrives at the house. *****[facility] compounding topical antibiotics. FOLLOW THE PHARMACY INSTRUCTIONS ON HOW TO MIX, and apply under the Collagen.****** ****PLEASE APPLY ZINC OXIDE as needed AROUND THE PERIWOUND DUE TO MACERATION. (when macerated) ****Patient to bring in the [facility] topical antibiotics to each wound care appt as well.***** *****HOME HEALTH TO PLEASE WRAP FROM BASE OF TOES TO JUST BELOW KNEE. please appy first layer of unna boot at the bast of the toes and just below the knee to help hold the compression wrap in place.**** Anesthetic Wound #5 Right,Lateral Lower Leg (In clinic) Topical Lidocaine 5% applied to wound bed (In clinic) Topical Lidocaine 4% applied to wound bed Bathing/ Shower/ Hygiene May shower and wash wound with soap and water. - with dressing changes. Edema Control - Lymphedema / SCD / Other Right Lower Extremity Elevate legs to the level of the heart or above for 30 minutes daily and/or when sitting for 3-4 times a day throughout the day. Avoid standing for long periods of time. Exercise regularly Home Health Wound #5 Right,Lateral Lower Leg New wound care orders this week; continue Home Health for wound care. May utilize formulary equivalent dressing for wound treatment orders  unless otherwise specified. - Primary dressing is Collagen Other Home Health Orders/Instructions: - [facility] [place], [place] fax # [phone]. Wound Treatment Wound #5 - Lower Leg Wound Laterality: Right, Lateral Cleanser: Soap and Water 3 x Per Week/30 Days Discharge Instructions: May shower and wash wound with dial antibacterial soap and water prior to dressing change. Peri-Wound Care: Zinc Oxide Ointment 30g tube 3 x Per Week/30 Days Discharge Instructions: Apply Zinc Oxide to periwound with each dressing change  TO ANY MACERATED SKIN. Peri-Wound Care: Sween Lotion (Moisturizing lotion) 3 x Per Week/30 Days Discharge Instructions: Apply moisturizing lotion as directed Topical: compounding topical antibiotics 3 x Per Week/30 Days Discharge Instructions: apply directly to wound bed under the hydrofera blue. Home Health start. MIX ACCORDING TO THE PHARMACY INSTRUCTIONS. Prim Dressing: Fibracol Plus Dressing, 4x4.38 in (collagen) 3 x Per Week/30 Days ary Discharge Instructions: Moisten collagen with saline or hydrogel Secondary Dressing: ABD Pad, 8x10 3 x Per Week/30 Days Discharge Instructions: Apply over primary dressing as directed. Secondary Dressing: Woven Gauze Sponge, Non-Sterile 4x4 in 3 x Per Week/30 Days Discharge Instructions: Apply over primary dressing as directed. Secondary Dressing: Zetuvit Plus 4x8 in 3 x Per Week/30 Days Jamie [name], Jamie [name] ([id number]) [id number] Page 6 of 11 Discharge Instructions: Or double ABD pad Apply over primary dressing as directed. Compression Wrap: Kerlix Roll 4.5x3.1 (in/yd) 3 x Per Week/30 Days Discharge Instructions: Apply Kerlix and Coban compression as directed. Compression Wrap: Coban Self-Adherent Wrap 4x5 (in/yd) 3 x Per Week/30 Days Discharge Instructions: Apply over Kerlix as directed. Compression Wrap: unna boot FIRST LAYER **** SEE INSTRUCTIONS 3 x Per Week/30 Days Discharge  Instructions: APPLY FIRST LAYER UNNA BOOT AT BASES OF TOES AND JUST BELOW THE KNEE TO HOLD COMPRESSION WRAP IN PLACE. Electronic Signature(s) Signed: 11/20/2022 5:04:24 PM By: Jamie Derry PA-C Signed: 11/20/2022 5:18:59 PM By: Jamie Schwalbe RN Entered By: Jamie [name] on 11/20/2022 08:31:39 -------------------------------------------------------------------------------- Problem List Details Patient Name: Date of Service: Jamie [name], Harrell Gave [name]. 11/20/2022 10:15 A M Medical Record Number: [id number] Patient Account Number: [id number] Date of Birth/Sex: Treating RN: 07/01/1940 (82 y.o. 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 05/22/2022 No Yes lower extremity L97.812 Non-pressure chronic ulcer of other part of right lower leg with fat layer 05/22/2022 No Yes exposed I25.10 Atherosclerotic heart disease of native coronary artery without angina pectoris 05/22/2022 No Yes Inactive Problems Resolved Problems Electronic Signature(s) Signed: 11/20/2022 11:26:12 AM By: Jamie Derry PA-C Previous Signature: 11/20/2022 10:01:43 AM Version By: Jamie Derry PA-C Entered By: Jamie [name] on 11/20/2022 08:26:11 Progress Note Details -------------------------------------------------------------------------------- [name] ([id number]) [id number] Page 7 of 11 Patient Name: Date of Service: Jamie [name], [name]. 11/20/2022 10:15 A M Medical Record Number: [id number] Patient Account Number: [id number] Date of Birth/Sex: Treating RN: [birth year] (82 y.o. F) Primary Care Provider: Delorise [name] Other Clinician: Referring Provider: Treating  Provider/Extender: Jamie [name], Jamie [name] in Treatment: 26 Subjective Chief Complaint Information obtained from Patient Bilateral LE Ulcers History of Present Illness (HPI) ADMISSION 06/30/2020 Mrs. Conkling is an 82 year old woman who lives in [place]. She is here with her niece for review of wounds on the left medial lower leg and ankle. These have apparently been present for over a year and she followed with Jamie [name] at the [facility] in [place] for quite a period of time although it looks as though there was a initial consult wound from Jamie [name] on February 18 presumably there was therefore hiatus. At that point the wounds were described as being there for 3 months. She also tells me she was at the wound care center in [place] for a period of time with this. 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 She had venous studies  that were negative for DVT ABIs on the right were 1.01 on the left 1.06. She has . had previous applications of puraply, compression which she does not tolerate very well. She has had several rounds of oral antibiotic therapy. She complains of unrelenting pain and she is seeing Jamie [name] of pain management apparently was on oxycodone but that did not help. She also had a skin biopsy done by Jamie [name] although we do not have that result. She does not appear to have an arterial issue. I am not completely clear what she has been putting on the wounds lately. 3/31; this patient has a particularly nasty set of wounds on the left medial ankle in the middle of what looks to be hemosiderin deposition secondary to chronic venous insufficiency. She has a lot of pain followed by pain management. Jamie [name] apparently did a biopsy of something on the left leg last fall what I would like to get this result. She has a history of MRSA treatment. I do not believe she had reflux studies but she did have  DVT rule out studies. Previous ABIs have not suggested arterial insufficiency 4/7; difficult wounds on the left medial ankle probably chronic venous insufficiency. With considerable effort on behalf of our case manager we were able to finally to speak to somebody at the hospital in [place] who indicated that no biopsy of this area have been done even though the patient describes this in some detail and is even able to point out where she thinks the biopsy was done. We have been using Sorbact. The PCR culture I did showed polymicrobial identification with Pseudomonas, staph aureus, Peptostreptococcus. All of this and low titers. Resistance genes identified were MRSA, staph virulence gene and tetracycline. We are going to send this to [facility] for a topical antibiotic which is something that we have had good success with recently in large venous ulcers with a lot of purulent drainage. There would not be an easy oral alternative here possibly line escalated and ciprofloxacin if we need to use systemic antibiotic 4/15; difficult area on the left medial ankle. Most likely chronic venous insufficiency. I think she will probably need venous reflux study I think she had DVT rule outs but not venous reflux studies. We have not yet obtained the topical antibiotics. She has home health changing the dressing we have been using Sorbact for adherent fibrinous debris on the surface. Very difficult to remove 4/22; patient presents for 1 week follow-up. She has been using sore back under compression wraps and these are changed 3 times a week with home health. She also had [facility] antibiotics sent to her house and brought them in today. She has no complaints or issues today. 5/2; patient is here for follow-up. She has been using Sorbact under compression. Very painful wound. She has been using [facility] antibiotics. Not much improvement although the more medial part of the wound has cleaned up nicely and the larger part  of the wound about 50% slough covered. Part of the issue here is that she had stays at both [facility] wound care center, [facility] wound care center and now [place]. Not sure if she has had venous reflux studies. As far as we are able to tell she did not have a biopsy. My notes state that she did not have venous reflux studies just DVT rule outs. 5/16; patient goes for venous reflux studies this afternoon. She says that wound was biopsied which sounds like punch biopsies by Dr. Lynden Ang  we do not have these results. We are using Sorbact. Very difficult wound to debride 5/23; patient presents for 1 week follow-up. She reports tolerating the wraps well with sorbact underneath. She had ABIs and venous reflux studies done. She has no issues or complaints today. She denies acute signs of infection. 6/6; I have reviewed the patient's vascular studies. Wounds are on the left medial and posterior calf. She had significant reflux in the greater saphenous vein in the in the mid thigh, distal thigh knee and the small saphenous vein in the popliteal fossa. The vein diameters do not look too impressive though. She is going to see the vascular surgeon on Wednesday. She also had venous reflux in the right common femoral vein. She did not have any evidence of a DVT or SVT I am wondering whether there is an ablation procedure that would benefit her in the greater saphenous vein on the left. . She tells [place] that home health put the dressing on too tight and she took off 1 layer. The swelling in her left leg is a little worse as a result of this. Not much change in the wound measurements so the surface of the wound looks better Her arterial studies showed an ABI on the right of 1.14 with a triphasic waveform and a great toe pressure of 0.89. On the left her ABIs were noncompressible at 1.34 but with triphasic waveforms and a TBI of 0.97. Her greater toe pressure was 122. There was no evidence of significant bilateral arterial  disease 6/20 patient went to see Dr. Durwin Nora. He did not think she had significant arterial disease. In terms of her venous duplex on the right side there was no evidence of a DVT or SVT there was deep venous reflux involving the common femoral vein no superficial vein reflux on the left side there was no DVT or SVT there was no deep vein graft reflux there was some reflux in the greater saphenous vein from the mid thigh to the knee but the vein here was not dilated. He thought these were venous wounds he prescribed a compression pump but I am not sure who we ordered it from The patient has been approved for Apligraf. Still using silver collagen this week 7/5; Apligraf #1 7/19 Apligraf #2. Decent improvement in the condition of the wound bed. Epithelialization distal 8/2 Apligraf #3. No issues or complaints. Denies signs of infection. 8/17 Apligraf #4. No issues or concerns. Some complaints of pruritus and the rash. Our intake nurse brought up the fact that she had previously indicated possible cotton layer sensitivity we will therefore use kerlix in the bottom layer of the compression 8/30; the patient comes in with the area on her left medial leg just about healed. There is a superficial area more towards the tibia and a smaller open area distally everything else is epithelialized. I do not think she requires another Apligraf 9/13; left medial leg is healed. 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 Her edema control is good Readmisstion: 05-22-2022 upon evaluation today patient presents for initial inspection here in our clinic concerning issues that she has been having with a wound of the right lateral lower extremity. This is an area of a previous skin graft she tells me. With that being said she unfortunately had a scrape on this that occurred around 10 January. Since that time she has noted that this has just continued to get bigger  and  bigger in her niece who is present with her today actually states that 2 [name] ago when she saw that it was significantly smaller than what she sees currently. Obviously this is of utmost concern as we do not want this to continue to get larger when arrested and get moving in the right direction. Fortunately there does not appear to be any signs of systemic infection though locally I think Jamie [name], Jamie [name] ([id number]) [id number] Page 8 of 11 we probably do have some infection present. I would obtain a culture to see what we have going on here and then we will subsequently see where things go going forward. Fortunately I think that she is in the right place at this point being at the wound center we can definitely do something to try to get this moving in the right direction. Patient does have a history of chronic venous insufficiency as well as coronary artery disease. In the past she has done well with compression wraps on the start with a 3 layer wrap I think she is probably can end up going to a 4-layer at some point but we will see how things do over the next week. She has been using wound cleanser along with she tells me a zinc and topical but again I am not sure exactly what that was. 05-29-2022 upon evaluation today patient appears to be doing well currently in regard to her wound. This actually is showing signs of improvement I am happy in that regard unfortunately her left leg has an area on the shin that has opened. This is the region where one of the areas at least we have previously taken care of. Nonetheless this is small hoping we get it under control before things worsen significantly. 06-05-2022 upon evaluation today patient appears to be doing well currently in regard to her wounds. The actually seem to be fairly clean she is having still quite a bit of problem with pain at this point she would like to not have debridement today for all possible. With  that being said I do believe that we are making some good progress I think the [facility] is doing a decent job here. 3/6; patient has had 3/6; patient with known severe chronic venous insufficiency. She apparently had a traumatic wound at home and has a reasonably substantial wound on the right lateral lower leg and a smaller one on the left anterior lower leg as well. We have been using Hydrofera Blue and Unna boots 3/13; patient presents for follow-up. We have been using Hydrofera Blue under Coflex to the legs bilaterally. She has no issues or complaints today. 06-26-2022 upon evaluation today patient appears to be doing well currently in regard to her wound. This is measuring a little bit larger however. Fortunately I do not see any signs of infection this is on the right side. Fortunately the left side is actually completely healed which is great news. 07-03-2022 patient's wound unfortunately is continuing to show signs of being worse. Her compression which was the Tubigrip that I thought should be able to pull up actually slipped down and she was not able to pull that back up. With that being said that means that unfortunately her wound has continued to deteriorate since I last saw her. This is definitely not the direction that we are looking for. I discussed with her that I do believe we need to go ahead and see about getting her started on antibiotics and I subsequently would also like  to go ahead and see about getting things moving forward with regard to a wound culture and making a change up her dressings as well but this can be changed more frequently than just once a week. 07-10-2022 upon evaluation today patient actually appears to be doing much better. I do believe that the antibiotics have been beneficial for her. Fortunately I do not see any signs of active infection locally nor systemically which is great news. No fevers, chills, nausea, vomiting, or diarrhea. 07-17-2022 upon evaluation  today patient appears to be doing well currently in regard to her wound which is actually showing signs of improvement this is slow but nonetheless prevalent that we are seeing good improvement here. Fortunately I do not see any signs of active infection locally nor systemically which is great news. 07-24-2022 upon evaluation today patient appears to be doing well currently in regard to her wound although the wrap that we had on has been slipping down and she really does not have anybody to help her with this at home health is not going to be coming out we could not get anybody that would actually be able to do the dressing changes. Fortunately I do not see any signs of active infection locally nor systemically which is great news. 08-07-22 poorly in regard to her leg compared to what it was previous. Fortunately there does not appear to be any signs of active infection locally nor systemically which is great news. No fevers, chills, nausea, vomiting, or diarrhea. With that being said it does appear that the patient may have some cellulitis in the leg which is not good. 08-14-2022 upon evaluation today patient appears to be doing somewhat better in regard to her wounds she still is having quite a bit of pain we are still not where we want to be as far as healing is concerned completely. Fortunately I do not see any evidence of active infection locally nor systemically which is great news and I am very pleased in that regard. 08-21-23 upon evaluation today patient appears to be doing poorly currently in regard to her wound. She has been tolerating the dressing changes without complication. Fortunately there does not appear to be any signs of active infection locally nor systemically at this time. 08-28-2022 upon evaluation today patient appears to be doing poorly in regard to her wound this was not wrapped appropriately home health did not go up high enough on the wrap. This has caused some issues and I  discussed that with the patient today. I do believe that she is going to require aggressive wrapping and treatment which she is getting the [facility] topical antibiotics today they can start using that at the next wrap on Friday. In the meantime they need to make sure that the rapid and appropriately we wrote very specific orders today. 09-04-2022 upon evaluation today patient appears to be doing well currently in regard to her wound which I think is making progress is still hurting her quite significantly. Fortunately I do not see any signs of active infection locally or systemically which is great news. No fevers, chills, nausea, vomiting, or diarrhea. 09-11-2022 upon evaluation today patient's wound actually is showing signs of being a little bit smaller and looking a little bit better she still has a lot going on here however. Fortunately I do not see any evidence of active infection Worsening locally nor systemically which is great news. With that being said worsening still continue to use the topical [facility] antibiotics. 09-18-2022  upon evaluation today patient actually showing some signs of improvement. I am actually very pleased with where we stand compared to where we have been. I think that she is making good headway here. She is still having quite a bit of pain but it seems to be lessening compared to previous. 09-25-2022 upon evaluation patient's wound actually showing signs slowly but surely of improvement. Fortunately I do not see any signs of active infection at this time systemically and locally I feel like this is dramatically improved. She is doing well with the [facility] topical antibiotics. 10-02-2022 upon evaluation today patient appears to be doing better in regard to her wound overall. I am very pleased with where things stand from a visual standpoint I do not see any signs of active infection and in general I think that we are moving in the right direction here. 10-09-2022 upon  evaluation today patient appears to be doing well currently in regard to her wound. She has been tolerating the dressing changes without complication. Fortunately I do not see any signs of active infection at this time which is great news. 10-16-2022 upon evaluation today patient continues to have quite a bit of pain in regard to her right leg. We have been attempting to debride little by little as we could but again was pretty limited by the fact that she is having significant discomfort. We do not actually have any signs of active infection going on at this point that the [facility] topical antibiotics have been helping quite readily. I been attempting to do as much debridement as I can but if she were to have pain medication this would actually help and in the past when she did it was much more tolerable for her as far as taking the edge off. Right now she has been without as she is in transition from her previous pain management physician to someone new to manage this. 10-23-2022 upon evaluation patient appears to be doing excellent in regard to her wound. She has been tolerating the dressing changes without complication. Fortunately I do not see any evidence of active infection locally nor systemically which is great news. No fevers, chills, nausea, vomiting, or diarrhea. 7/24; patient with a wound secondary to chronic venous insufficiency on the right lateral lower leg. We have been using [facility] antibiotic Hydrofera Blue under kerlix Coban. She complains of a lot of pain after last week's debridement otherwise things appear to be improved per discussion with our intake nurse. 11-06-2022 upon evaluation today patient appears to be doing excellent at this point in regard to her wound. She has been tolerating the dressing changes without complication and to be honest she is making excellent progress towards complete closure. I am actually very pleased with where we stand I think that she is doing  excellent as far as the overall appearance of the wound is concerned as well. She is going require some debridement however. Jamie [name], Jamie [name] ([id number]) [id number] Page 9 of 11 11-20-2022 upon evaluation today patient appears to be doing well currently in regard to her wound. She has been tolerating the dressing changes without complication. Fortunately there does not appear to be any signs of active infection locally or systemically which is great news and in general I do believe that we are moving in the right direction here. No fevers, chills, nausea, vomiting, or diarrhea. Objective Constitutional Well-nourished and well-hydrated in no acute distress. Vitals Time Taken: 10:16 AM, Height: 62 in, Weight: 171 lbs, BMI: 31.3, Temperature: 97.8  F, Pulse: 54 bpm, Respiratory Rate: 18 breaths/min, Blood Pressure: 137/79 mmHg. Respiratory normal breathing without difficulty. Psychiatric this patient is able to make decisions and demonstrates good insight into disease process. Alert and Oriented x 3. pleasant and cooperative. General Notes: Upon inspection patient's wound bed actually showed signs of good granulation and epithelization at this point. Fortunately I do not see any signs of infection or worsening overall and I do believe that the patient is making excellent headway towards complete closure which is great news. Nonetheless I think with her drainage slowing down quite significantly at this point it is a good time to switch over to a silver collagen dressing to see how this will do. Integumentary (Hair, Skin) Wound #5 status is Open. Original cause of wound was Trauma. The date acquired was: 04/17/2022. The wound has been in treatment 26 [name]. The wound is located on the Right,Lateral Lower Leg. The wound measures 12.3cm length x 8cm width x 0.2cm depth; 77.283cm^2 area and 15.457cm^3 volume. There is Fat Layer (Subcutaneous Tissue) exposed. There is no  tunneling or undermining noted. There is a medium amount of serosanguineous drainage noted. The wound margin is distinct with the outline attached to the wound base. There is medium (34-66%) red granulation within the wound bed. There is a medium (34-66%) amount of necrotic tissue within the wound bed including Eschar and Adherent Slough. The periwound skin appearance exhibited: Scarring, Dry/Scaly, Hemosiderin Staining. The periwound skin appearance did not exhibit: Callus, Crepitus, Excoriation, Induration, Rash, Maceration, Atrophie Blanche, Cyanosis, Ecchymosis, Mottled, Pallor, Rubor, Erythema. Periwound temperature was noted as No Abnormality. The periwound has tenderness on palpation. 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 There was a Excisional Skin/Subcutaneous Tissue Debridement with a total area of 38.62 sq cm performed by Jamie Kelp, PA. With the following instrument(s): Curette to remove Viable and Non-Viable tissue/material. Material removed includes Subcutaneous Tissue and Slough and after achieving pain control using Lidocaine 4% T opical Solution. No specimens were taken. A time out was conducted at 10:51, prior to the start of the procedure. A Minimum amount of bleeding was controlled with Pressure. The procedure was tolerated well with a pain level of 0 throughout and a pain level of 0 following the procedure. Post Debridement Measurements: 12.3cm length x 8cm width x 0.2cm depth; 15.457cm^3 volume. Character of Wound/Ulcer Post Debridement is improved.  Severity of Tissue Post Debridement is: Fat layer exposed. Post procedure Diagnosis Wound #5: Same as Pre-Procedure General Notes: Scribed for [employer] PA-C, by J.Scotton. Plan Follow-up Appointments: Return Appointment in 1 week. Leonard Schwartz Wednesday Room 8 or 9 weekly 11/27/22 at 10:15am Return Appointment in 2 [name]. Leonard Schwartz Wednesday Room 8 or 9 weekly 12/04/22 at 10:15am Other: - home health to wash right leg and foot with soap (dial soap) and water with dressing changes. Patient to have soap and towels next to her when Home Health arrives at the house. *****[facility] compounding topical antibiotics. FOLLOW THE PHARMACY INSTRUCTIONS ON HOW TO MIX, and apply under the Collagen.****** ****PLEASE APPLY ZINC OXIDE as needed AROUND THE PERIWOUND DUE TO MACERATION. (when macerated) ****Patient to bring in  the [facility] topical antibiotics to each wound care appt as well.***** *****HOME HEALTH TO PLEASE WRAP FROM BASE OF TOES TO JUST BELOW KNEE. please appy first layer of unna boot at the bast of the toes and just below the knee to help hold the compression wrap in place.**** Anesthetic: Jamie [name], Jamie [name] ([id number]) [id number] Page 10 of 11 Wound #5 Right,Lateral Lower Leg: (In clinic) Topical Lidocaine 5% applied to wound bed (In clinic) Topical Lidocaine 4% applied to wound bed Bathing/ Shower/ Hygiene: May shower and wash wound with soap and water. - with dressing changes. Edema Control - Lymphedema / SCD / Other: Elevate legs to the level of the heart or above for 30 minutes daily and/or when sitting for 3-4 times a day throughout the day. Avoid standing for long periods of time. Exercise regularly Home Health: Wound #5 Right,Lateral Lower Leg: New wound care orders this week; continue Home Health for wound care. May utilize formulary equivalent dressing for wound treatment orders unless otherwise specified. - Primary dressing is Collagen Other Home  Health Orders/Instructions: - [facility] in [place], [place] fax # [phone]. WOUND #5: - Lower Leg Wound Laterality: Right, Lateral Cleanser: Soap and Water 3 x Per Week/30 Days Discharge Instructions: May shower and wash wound with dial antibacterial soap and water prior to dressing change. Peri-Wound Care: Zinc Oxide Ointment 30g tube 3 x Per Week/30 Days Discharge Instructions: Apply Zinc Oxide to periwound with each dressing change TO ANY MACERATED SKIN. Peri-Wound Care: Sween Lotion (Moisturizing lotion) 3 x Per Week/30 Days Discharge Instructions: Apply moisturizing lotion as directed Topical: compounding topical antibiotics 3 x Per Week/30 Days Discharge Instructions: apply directly to wound bed under the hydrofera blue. Home Health start. MIX ACCORDING TO THE PHARMACY INSTRUCTIONS. Prim Dressing: Fibracol Plus Dressing, 4x4.38 in (collagen) 3 x Per Week/30 Days ary Discharge Instructions: Moisten collagen with saline or hydrogel Secondary Dressing: ABD Pad, 8x10 3 x Per Week/30 Days Discharge Instructions: Apply over primary dressing as directed. Secondary Dressing: Woven Gauze Sponge, Non-Sterile 4x4 in 3 x Per Week/30 Days Discharge Instructions: Apply over primary dressing as directed. Secondary Dressing: Zetuvit Plus 4x8 in 3 x Per Week/30 Days Discharge Instructions: Or double ABD pad Apply over primary dressing as directed. Com pression Wrap: Kerlix Roll 4.5x3.1 (in/yd) 3 x Per Week/30 Days Discharge Instructions: Apply Kerlix and Coban compression as directed. Com pression Wrap: Coban Self-Adherent Wrap 4x5 (in/yd) 3 x Per Week/30 Days Discharge Instructions: Apply over Kerlix as directed. Com pression Wrap: unna boot FIRST LAYER **** SEE INSTRUCTIONS 3 x Per Week/30 Days Discharge Instructions: APPLY FIRST LAYER UNNA BOOT AT BASES OF TOES AND JUST BELOW THE KNEE TO HOLD COMPRESSION WRAP IN PLACE. 1. Based on what I am seeing I do believe that the patient  would benefit from a continuation of therapy with the compression wrapping which is doing very well for her. 2. I am going to recommend as well the patient should continue with the [facility] topical antibiotics which I think she is doing very well with at this point. 3. I am also going to recommend she should continue with the dressings although we will get a switch from [facility] to collagen to see if we can speed things up from a healing perspective the patient is in agreement to give this a try. We will see patient back for reevaluation in 1 week here in the clinic. If anything worsens or changes patient will contact our office for additional recommendations. Electronic Signature(s) Signed:  11/24/2022 10:48:24 AM By: Shawn Stall RN, BSN Signed: 11/29/2022 1:58:20 PM By: Jamie Derry PA-C Previous Signature: 11/20/2022 11:29:33 AM Version By: Jamie Derry PA-C Previous Signature: 11/20/2022 11:18:55 AM Version By: Jamie Derry PA-C Entered By: Shawn Stall on 11/24/2022 07:47:52 -------------------------------------------------------------------------------- SuperBill Details Patient Name: Date of Service: Jamie [name], Jamie [name]. 11/20/2022 Medical Record Number: [id number] Patient Account Number: [id number] Date of Birth/Sex: Treating RN: 1941-02-12 (82 y.o. F) Primary Care Provider: Delorise [name] Other Clinician: Referring Provider: Treating Provider/Extender: Jamie [name], Jamie [name] in Treatment: 26 Diagnosis Coding ICD-10 Codes Code Description [phone] Chronic venous hypertension (idiopathic) with ulcer and inflammation of right lower extremity 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d I25.10 Atherosclerotic heart disease of native coronary artery without angina pectoris MERCEDESE, [employer] ([id number]) [id number] Page 11 of 11 Facility Procedures : CPT4 Code:  [id number] Description: 11042 - DEB SUBQ TISSUE 20 SQ CM/< ICD-10 Diagnosis Description L97.812 Non-pressure chronic ulcer of other part of right lower leg with fat layer expo Modifier: sed Quantity: 1 : CPT4 Code: [id number] Description: 11045 - DEB SUBQ TISS EA ADDL 20CM ICD-10 Diagnosis Description L97.812 Non-pressure chronic ulcer of other part of right lower leg with fat layer expo Modifier: sed Quantity: 1 Physician Procedures : CPT4 Code Description Modifier [id number] 11042 - WC PHYS SUBQ TISS 20 SQ CM ICD-10 Diagnosis Description 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d Quantity: 1 : [id number] 11045 - WC PHYS SUBQ TISS EA ADDL 20 CM ICD-10 Diagnosis Description 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d Quantity: 1 Electronic Signature(s) Signed: 11/20/2022 11:33:21 AM By: Jamie Derry PA-C Previous Signature: 11/20/2022 11:21:25 AM Version By: Jamie Derry PA-C Entered By: Jamie [name] on 11/20/2022 08:33:20

## 2022-11-22 NOTE — Progress Notes (Signed)
Jamie, [name] ([id number]) [id number] Page 1 of 6 Visit Report for 11/20/2022 Arrival Information Details Patient Name: Date of Service: Jamie [name], [name]. 11/20/2022 10:15 A M Medical Record Number: [id number] Patient Account Number: [id number] Date of Birth/Sex: Treating RN: [birth year] (82 y.o. F) Primary Care Jamie [name]: Jamie [name] Other Clinician: Referring Jamie [name]: Treating Jamie [name]/Extender: Jamie [name], Jamie [name] in Treatment: 26 Visit Information History Since Last Visit Added or deleted any medications: No Patient Arrived: Wheel Chair Any new allergies or adverse reactions: No Arrival Time: 10:00 Had a fall or experienced change in No Accompanied By: niece activities of daily living that may affect Transfer Assistance: None risk of falls: Patient Identification Verified: Yes Signs or symptoms of abuse/neglect since last visito No Secondary Verification Process Completed: Yes Hospitalized since last visit: No Patient Requires Transmission-Based Precautions: No Implantable device outside of the clinic excluding No Patient Has Alerts: No cellular tissue based products placed in the center since last visit: Has Dressing in Place as Prescribed: Yes Has Compression in Place as Prescribed: Yes Pain Present Now: Yes Electronic Signature(s) Signed: 11/22/2022 12:35:38 PM By: Jamie [name] Entered By: Jamie [name] on 11/20/2022 10:16:21 -------------------------------------------------------------------------------- Encounter Discharge Information Details Patient Name: Date of Service: Jamie [name], Jamie [name] 11/20/2022 10:15 A M Medical Record Number: [id number] Patient Account Number: [id number] Date of Birth/Sex: Treating RN: May 09, 1940 (82 y.o. [name] Primary Care Brysan Mcevoy: Jamie [name] Other Clinician: Referring Miller Limehouse: Treating Leauna Sharber/Extender: Jamie [name], Jamie [name] in Treatment: 26 Encounter Discharge Information Items Post Procedure Vitals Discharge Condition: Stable Temperature (F): 97.8 Ambulatory Status: Wheelchair Pulse (bpm): 54 Discharge Destination: Home Respiratory Rate (breaths/min): 18 Transportation: Private Auto Blood Pressure (mmHg): 137/79 Accompanied By: Daughter Schedule Follow-up Appointment: Yes Clinical Summary of Care: Patient Declined Electronic Signature(s) Signed: 11/20/2022 5:18:59 PM By: Jamie Schwalbe RN Entered By: Jamie [name] on 11/20/2022 17:17:30 Jamie [name] ([id number]) [id number] Page 2 of 6 -------------------------------------------------------------------------------- Lower Extremity Assessment Details Patient Name: Date of Service: Jamie [name], [name]. 11/20/2022 10:15 A M Medical Record Number: [id number] Patient Account Number: [id number] Date of Birth/Sex: Treating RN: Dec 14, 1940 (82 y.o. F) Primary Care Athina Fahey: Jamie [name] Other Clinician: Referring Kambrie Eddleman: Treating Tonantzin Mimnaugh/Extender: Jamie [name], Jamie [name] in Treatment: 26 Edema Assessment Assessed: [Left: No] [Right: No] Edema: [Left: Ye] [Right: s] Calf Left: Right: Point of Measurement: From Medial Instep 37.5 cm Ankle Left: Right: Point of Measurement: From Medial Instep 25.6 cm Vascular Assessment Extremity colors, hair growth, and conditions: Extremity Color: [Right:Normal] Hair Growth on Extremity: [Right:No] Temperature of Extremity: [Right:Warm] Capillary Refill: [Right:< 3 seconds] Dependent Rubor: [Right:No No] Electronic Signature(s) Signed: 11/22/2022 12:35:38 PM By: Jamie [name] Entered By: Jamie [name] on 11/20/2022 10:17:15 -------------------------------------------------------------------------------- Multi-Disciplinary Care Plan Details Patient Name: Date of Service: Jamie Brink, MA Jamie W.  11/20/2022 10:15 A M Medical Record Number: [id number] Patient Account Number: [id number] Date of Birth/Sex: Treating RN: 03-23-1941 (82 y.o. [name] Primary Care Justyn Langham: Jamie [name] Other Clinician: Referring Jamie [name]: Treating Jamie [name]/Extender: Jamie [name] in Treatment: 26 Multidisciplinary Care Plan reviewed with physician Active Inactive Pain, Acute or Chronic Nursing Diagnoses: Pain Management - Cyclic Acute (Dressing Change Related) Pain Management - Non-cyclic Acute (Procedural) Pain, acute or chronic: actual or potential Goals: Patient will verbalize adequate pain control and receive pain control interventions during procedures as needed Jamie [name], Jamie [name] ([id number]) [id number] Page 3 of 6 Date Initiated: 09/11/2022 Target Resolution Date: 02/06/2023 Goal Status: Active Patient/caregiver will verbalize  adequate pain control between visits Date Initiated: 09/11/2022 Target Resolution Date: 02/06/2023 Goal Status: Active Patient/caregiver will verbalize comfort level met Date Initiated: 09/11/2022 Target Resolution Date: 02/06/2023 Goal Status: Active Interventions: Complete pain assessment as per visit requirements Encourage patient to take pain medications as prescribed Provide education on pain management Provision of support: recognize patient pain, provide comfort and support as needed Reposition patient for comfort Treatment Activities: Administer pain control measures as ordered : 09/11/2022 Notes: Electronic Signature(s) Signed: 11/20/2022 5:18:59 PM By: Jamie Schwalbe RN Entered By: Jamie [name] on 11/20/2022 17:15:49 -------------------------------------------------------------------------------- Pain Assessment Details Patient Name: Date of Service: Jamie [name], Jamie [name] 11/20/2022 10:15 A M Medical Record Number: [id number] Patient Account Number: [id number] Date of  Birth/Sex: Treating RN: [birth year] (82 y.o. F) Primary Care Lorn Butcher: Jamie [name] Other Clinician: Referring Demon Volante: Treating Kazaria Gaertner/Extender: Jamie [name], Jamie [name] in Treatment: 26 Active Problems Location of Pain Severity and Description of Pain Patient Has Paino Yes Site Locations Pain Location: Generalized Pain, Pain in Ulcers Rate the pain. Current Pain Level: 10 Pain Management and Medication Current Pain Management: Electronic Signature(s) Signed: 11/22/2022 12:35:38 PM By: Jamie [name] Entered By: Jamie [name] on 11/20/2022 10:16:55 Jamie [name] ([id number]) [id number] Page 4 of 6 -------------------------------------------------------------------------------- Patient/Caregiver Education Details Patient Name: Date of Service: Jamie [name], [place] 8/14/2024andnbsp10:15 A M Medical Record Number: [id number] Patient Account Number: [id number] Date of Birth/Gender: Treating RN: 1940/07/14 (82 y.o. [name] Primary Care Physician: Jamie [name] Other Clinician: Referring Physician: Treating Physician/Extender: Jamie [name], Billey Gosling in Treatment: 47 Education Assessment Education Provided To: Patient Education Topics Provided Wound/Skin Impairment: Methods: Explain/Verbal Responses: Return demonstration correctly Electronic Signature(s) Signed: 11/20/2022 5:18:59 PM By: Jamie Schwalbe RN Entered By: Jamie [name] on 11/20/2022 17:16:05 -------------------------------------------------------------------------------- Wound Assessment Details Patient Name: Date of Service: Jamie Krill. 11/20/2022 10:15 A M Medical Record Number: [id number] Patient Account Number: [id number] Date of Birth/Sex: Treating RN: 10/01/1940 (82 y.o. F) Primary Care Maciah Feeback: Jamie [name] Other Clinician: Referring Lyndel Sarate: Treating  Videl Nobrega/Extender: Jamie [name], Jamie [name] in Treatment: 26 Wound Status Wound Number: 5 Primary Venous Leg Ulcer Etiology: Wound Location: Right, Lateral Lower Leg Wound Open Wounding Event: Trauma Status: Date Acquired: 04/17/2022 Comorbid Sleep Apnea, Hypertension, Peripheral Venous Disease, [name] Of Treatment: 26 History: Osteoarthritis, Neuropathy Clustered Wound: Yes Photos Wound Measurements Length: (cm) 12.3 Width: (cm) 8 Jamie [name], Jamie [name] ([id number]) Depth: (cm) 0.2 Clustered Quantity: 3 Area: (cm) 77.283 Volume: (cm) 15.457 % Reduction in Area: -195.3% % Reduction in Volume: -96.9% [id number] Page 5 of 6 Epithelialization: Small (1-33%) Tunneling: No Undermining: No Wound Description Classification: Full Thickness Without Exposed Supp Wound Margin: Distinct, outline attached Exudate Amount: Medium Exudate Type: Serosanguineous Exudate Color: red, brown ort Structures Foul Odor After Cleansing: No Slough/Fibrino Yes Wound Bed Granulation Amount: Medium (34-66%) Exposed Structure Granulation Quality: Red Fascia Exposed: No Necrotic Amount: Medium (34-66%) Fat Layer (Subcutaneous Tissue) Exposed: Yes Necrotic Quality: Eschar, Adherent Slough Tendon Exposed: No Muscle Exposed: No Joint Exposed: No Bone Exposed: No Periwound Skin Texture Texture Color No Abnormalities Noted: No No Abnormalities Noted: No Callus: No Atrophie Blanche: No Crepitus: No Cyanosis: No Excoriation: No Ecchymosis: No Induration: No Erythema: No Rash: No Hemosiderin Staining: Yes Scarring: Yes Mottled: No Pallor: No Moisture Rubor: No No Abnormalities Noted: No Dry / Scaly: Yes Temperature / Pain Maceration: No Temperature: No Abnormality Tenderness on Palpation: Yes Treatment Notes Wound #5 (Lower Leg) Wound Laterality: Right, Lateral Cleanser Soap and Water  Discharge Instruction: May shower and wash wound  with dial antibacterial soap and water prior to dressing change. Peri-Wound Care Zinc Oxide Ointment 30g tube Discharge Instruction: Apply Zinc Oxide to periwound with each dressing change TO ANY MACERATED SKIN. Sween Lotion (Moisturizing lotion) Discharge Instruction: Apply moisturizing lotion as directed Topical compounding topical antibiotics Discharge Instruction: apply directly to wound bed under the hydrofera blue. Home Health start. MIX ACCORDING TO THE PHARMACY INSTRUCTIONS. Primary Dressing Fibracol Plus Dressing, 4x4.38 in (collagen) Discharge Instruction: Moisten collagen with saline or hydrogel Secondary Dressing ABD Pad, 8x10 Discharge Instruction: Apply over primary dressing as directed. Woven Gauze Sponge, Non-Sterile 4x4 in Discharge Instruction: Apply over primary dressing as directed. Zetuvit Plus 4x8 in Discharge Instruction: Or double ABD pad Apply over primary dressing as directed. Secured With Compression Wrap Kerlix Roll 4.5x3.1 (in/yd) Discharge Instruction: Apply Kerlix and Coban compression as directed. Coban Self-Adherent Wrap 4x5 (in/yd) MASIAH, Jamie [name] ([id number]) [id number] Page 6 of 6 Discharge Instruction: Apply over Kerlix as directed. unna boot FIRST LAYER **** SEE INSTRUCTIONS Discharge Instruction: APPLY FIRST LAYER UNNA BOOT AT BASES OF TOES AND JUST BELOW THE KNEE TO HOLD COMPRESSION WRAP IN PLACE. Compression Stockings Add-Ons Electronic Signature(s) Signed: 11/20/2022 5:18:59 PM By: Jamie Schwalbe RN Entered By: Jamie [name] on 11/20/2022 10:27:25 -------------------------------------------------------------------------------- Vitals Details Patient Name: Date of Service: Jamie Brink, MA Jamie W. 11/20/2022 10:15 A M Medical Record Number: [id number] Patient Account Number: [id number] Date of Birth/Sex: Treating RN: [birth year] (82 y.o. F) Primary Care Lipa Knauff: Jamie [name] Other  Clinician: Referring Alvis Edgell: Treating Sherry Rogus/Extender: Jamie [name], Jamie [name] in Treatment: 26 Vital Signs Time Taken: 10:16 Temperature (F): 97.8 Height (in): 62 Pulse (bpm): 54 Weight (lbs): 171 Respiratory Rate (breaths/min): 18 Body Mass Index (BMI): 31.3 Blood Pressure (mmHg): 137/79 Reference Range: 80 - 120 mg / dl Electronic Signature(s) Signed: 11/22/2022 12:35:38 PM By: Jamie [name] Entered By: Jamie [name] on 11/20/2022 10:16:40

## 2022-11-27 ENCOUNTER — Encounter (HOSPITAL_BASED_OUTPATIENT_CLINIC_OR_DEPARTMENT_OTHER): Payer: Medicare HMO | Admitting: Physician Assistant

## 2022-11-27 DIAGNOSIS — I87331 Chronic venous hypertension (idiopathic) with ulcer and inflammation of right lower extremity: Secondary | ICD-10-CM | POA: Diagnosis not present

## 2022-11-27 NOTE — Progress Notes (Addendum)
Jamie [name], Jamie [name] ([id number]) [id number] Page 1 of 10 Visit Report for 11/27/2022 Chief Complaint Document Details Patient Name: Date of Service: Jamie [name], [name]. 11/27/2022 10:15 A M Medical Record Number: [id number] Patient Account Number: [id number] Date of Birth/Sex: Treating RN: April 22, 1940 (82 y.o. F) Primary Care Provider: Delorise [name] Other Clinician: Referring Provider: Treating Provider/Extender: Jamie [name], Jamie [name] in Treatment: 27 Information Obtained from: Patient Chief Complaint Right LE Ulcer Electronic Signature(s) Signed: 11/27/2022 10:07:26 AM By: Jamie Derry PA-C Previous Signature: 11/27/2022 10:07:03 AM Version By: Jamie Derry PA-C Entered By: Jamie [name] on 11/27/2022 07:07:26 -------------------------------------------------------------------------------- HPI Details Patient Name: Date of Service: Jamie [name], [name] 11/27/2022 10:15 A M Medical Record Number: [id number] Patient Account Number: [id number] Date of Birth/Sex: Treating RN: 04/19/1940 (82 y.o. F) Primary Care Provider: Delorise [name] Other Clinician: Referring Provider: Treating Provider/Extender: Jamie [name], Jamie [name] in Treatment: 27 History of Present Illness HPI Description: ADMISSION 06/30/2020 Jamie [name] is an 82 year old woman who lives in [place]. She is here with her niece for review of wounds on the left medial lower leg and ankle. These have apparently been present for over a year and she followed with Jamie [name] at the [facility] in [place] for quite a period of time although it looks as though there was a initial consult wound from Jamie [name] on February 18 presumably there was therefore hiatus. At that point the wounds were described as being there for 3 months. She also tells me she was at the wound care center in [place] for a period of time with this.  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 She had venous studies that were negative for DVT ABIs on the right were 1.01 on the left 1.06. She has . had previous applications of puraply, compression which she does not tolerate very well. She has had several rounds of oral antibiotic therapy. She complains of unrelenting pain and she is seeing Jamie [name] V of pain management apparently was on oxycodone but that did not help. She also had a skin biopsy done by Jamie [name] although we do not have that result. She does not appear to have an arterial issue. I am not completely clear what she has been putting on the wounds lately. 3/31; this patient has a particularly nasty set of wounds on the left medial ankle in the middle of what looks to be hemosiderin deposition secondary to chronic venous insufficiency. She has a lot of pain followed by pain management. Jamie [name] apparently did a biopsy of something on the left leg last fall what I would like to get this result. She has a history of MRSA treatment. I do not believe she had reflux studies but she did have DVT rule out studies. Previous ABIs have not suggested arterial insufficiency 4/7; difficult wounds on the left medial ankle probably chronic venous insufficiency. With considerable effort on behalf of our case manager we were able to finally to speak to somebody at the hospital in [place] who indicated that no biopsy of this area have been done even though the patient describes this in some detail and is even able to point out where she thinks the biopsy was done. We have been using Sorbact. The PCR culture I did showed polymicrobial identification with Pseudomonas, staph aureus, Peptostreptococcus. All of this and low titers. Resistance genes identified were MRSA, staph virulence gene and tetracycline. We are going to send  this to [facility] for a topical antibiotic which is something that we have had good  success with recently in large venous ulcers with a lot of purulent drainage. There would not be an easy oral alternative here possibly line escalated and ciprofloxacin if we need to use systemic antibiotic 4/15; difficult area on the left medial ankle. Most likely chronic venous insufficiency. I think she will probably need venous reflux study I think she had DVT rule outs but not venous reflux studies. We have not yet obtained the topical antibiotics. She has home health changing the dressing we have been using Sorbact for adherent fibrinous debris on the surface. Very difficult to remove Jamie [name] ([id number]) [id number] Page 2 of 10 4/22; patient presents for 1 week follow-up. She has been using sore back under compression wraps and these are changed 3 times a week with home health. She also had Keystone antibiotics sent to her house and brought them in today. She has no complaints or issues today. 5/2; patient is here for follow-up. She has been using Sorbact under compression. Very painful wound. She has been using Keystone antibiotics. Not much improvement although the more medial part of the wound has cleaned up nicely and the larger part of the wound about 50% slough covered. Part of the issue here is that she had stays at both [facility] wound care center, [facility] wound care center and now [place]. Not sure if she has had venous reflux studies. As far as we are able to tell she did not have a biopsy. My notes state that she did not have venous reflux studies just DVT rule outs. 5/16; patient goes for venous reflux studies this afternoon. She says that wound was biopsied which sounds like punch biopsies by Dr. Lynden Ang we do not have these results. We are using Sorbact. Very difficult wound to debride 5/23; patient presents for 1 week follow-up. She reports tolerating the wraps well with sorbact underneath. She had ABIs and venous reflux studies done. She has no  issues or complaints today. She denies acute signs of infection. 6/6; I have reviewed the patient's vascular studies. Wounds are on the left medial and posterior calf. She had significant reflux in the greater saphenous vein in the in the mid thigh, distal thigh knee and the small saphenous vein in the popliteal fossa. The vein diameters do not look too impressive though. She is going to see the vascular surgeon on Wednesday. She also had venous reflux in the right common femoral vein. She did not have any evidence of a DVT or SVT I am wondering whether there is an ablation procedure that would benefit her in the greater saphenous vein on the left. . She tells [place] that home health put the dressing on too tight and she took off 1 layer. The swelling in her left leg is a little worse as a result of this. Not much change in the wound measurements so the surface of the wound looks better Her arterial studies showed an ABI on the right of 1.14 with a triphasic waveform and a great toe pressure of 0.89. On the left her ABIs were noncompressible at 1.34 but with triphasic waveforms and a TBI of 0.97. Her greater toe pressure was 122. There was no evidence of significant bilateral arterial disease 6/20 patient went to see Dr. Durwin Nora. He did not think she had significant arterial disease. In terms of her venous duplex on the right side there was no evidence of a DVT  or SVT there was deep venous reflux involving the common femoral vein no superficial vein reflux on the left side there was no DVT or SVT there was no deep vein graft reflux there was some reflux in the greater saphenous vein from the mid thigh to the knee but the vein here was not dilated. He thought these were venous wounds he prescribed a compression pump but I am not sure who we ordered it from The patient has been approved for Apligraf. Still using silver collagen this week 7/5; Apligraf #1 7/19 Apligraf #2. Decent improvement in the condition  of the wound bed. Epithelialization distal 8/2 Apligraf #3. No issues or complaints. Denies signs of infection. 8/17 Apligraf #4. No issues or concerns. Some complaints of pruritus and the rash. Our intake nurse brought up the fact that she had previously indicated possible cotton layer sensitivity we will therefore use kerlix in the bottom layer of the compression 8/30; the patient comes in with the area on her left medial leg just about healed. There is a superficial area more towards the tibia and a smaller open area distally everything else is epithelialized. I do not think she requires another Apligraf 9/13; left medial leg is healed. 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 Her edema control is good Readmisstion: 05-22-2022 upon evaluation today patient presents for initial inspection here in our clinic concerning issues that she has been having with a wound of the right lateral lower extremity. This is an area of a previous skin graft she tells me. With that being said she unfortunately had a scrape on this that occurred around 10 January. Since that time she has noted that this has just continued to get bigger and bigger in her niece who is present with her today actually states that 2 [name] ago when she saw that it was significantly smaller than what she sees currently. Obviously this is of utmost concern as we do not want this to continue to get larger when arrested and get moving in the right direction. Fortunately there does not appear to be any signs of systemic infection though locally I think we probably do have some infection present. I would obtain a culture to see what we have going on here and then we will subsequently see where things go going forward. Fortunately I think that she is in the right place at this point being at the wound center we can definitely do something to try to get this moving in the right  direction. Patient does have a history of chronic venous insufficiency as well as coronary artery disease. In the past she has done well with compression wraps on the start with a 3 layer wrap I think she is probably can end up going to a 4-layer at some point but we will see how things do over the next week. She has been using wound cleanser along with she tells me a zinc and topical but again I am not sure exactly what that was. 05-29-2022 upon evaluation today patient appears to be doing well currently in regard to her wound. This actually is showing signs of improvement I am happy in that regard unfortunately her left leg has an area on the shin that has opened. This is the region where one of the areas at least we have previously taken care of. Nonetheless this is small hoping we get it under control before things worsen significantly. 06-05-2022 upon evaluation  today patient appears to be doing well currently in regard to her wounds. The actually seem to be fairly clean she is having still quite a bit of problem with pain at this point she would like to not have debridement today for all possible. With that being said I do believe that we are making some good progress I think the [facility] is doing a decent job here. 3/6; patient has had 3/6; patient with known severe chronic venous insufficiency. She apparently had a traumatic wound at home and has a reasonably substantial wound on the right lateral lower leg and a smaller one on the left anterior lower leg as well. We have been using Hydrofera Blue and Unna boots 3/13; patient presents for follow-up. We have been using Hydrofera Blue under Coflex to the legs bilaterally. She has no issues or complaints today. 06-26-2022 upon evaluation today patient appears to be doing well currently in regard to her wound. This is measuring a little bit larger however. Fortunately I do not see any signs of infection this is on the right side. Fortunately the  left side is actually completely healed which is great news. 07-03-2022 patient's wound unfortunately is continuing to show signs of being worse. Her compression which was the Tubigrip that I thought should be able to pull up actually slipped down and she was not able to pull that back up. With that being said that means that unfortunately her wound has continued to deteriorate since I last saw her. This is definitely not the direction that we are looking for. I discussed with her that I do believe we need to go ahead and see about getting her started on antibiotics and I subsequently would also like to go ahead and see about getting things moving forward with regard to a wound culture and making a change up her dressings as well but this can be changed more frequently than just once a week. 07-10-2022 upon evaluation today patient actually appears to be doing much better. I do believe that the antibiotics have been beneficial for her. Fortunately I do not see any signs of active infection locally nor systemically which is great news. No fevers, chills, nausea, vomiting, or diarrhea. 07-17-2022 upon evaluation today patient appears to be doing well currently in regard to her wound which is actually showing signs of improvement this is slow but nonetheless prevalent that we are seeing good improvement here. Fortunately I do not see any signs of active infection locally nor systemically which is great news. 07-24-2022 upon evaluation today patient appears to be doing well currently in regard to her wound although the wrap that we had on has been slipping down and she really does not have anybody to help her with this at home health is not going to be coming out we could not get anybody that would actually be able to do the dressing changes. Fortunately I do not see any signs of active infection locally nor systemically which is great news. 08-07-22 poorly in regard to her leg compared to what it was previous.  Fortunately there does not appear to be any signs of active infection locally nor systemically which is great news. No fevers, chills, nausea, vomiting, or diarrhea. With that being said it does appear that the patient may have some cellulitis in the leg which is not good. 08-14-2022 upon evaluation today patient appears to be doing somewhat better in regard to her wounds she still is having quite a bit of pain  we are still not where we want to be as far as healing is concerned completely. Fortunately I do not see any evidence of active infection locally nor systemically which is great news Jamie [name], Jamie [name] ([id number]) [id number] Page 3 of 10 and I am very pleased in that regard. 08-21-23 upon evaluation today patient appears to be doing poorly currently in regard to her wound. She has been tolerating the dressing changes without complication. Fortunately there does not appear to be any signs of active infection locally nor systemically at this time. 08-28-2022 upon evaluation today patient appears to be doing poorly in regard to her wound this was not wrapped appropriately home health did not go up high enough on the wrap. This has caused some issues and I discussed that with the patient today. I do believe that she is going to require aggressive wrapping and treatment which she is getting the [facility] topical antibiotics today they can start using that at the next wrap on Friday. In the meantime they need to make sure that the rapid and appropriately we wrote very specific orders today. 09-04-2022 upon evaluation today patient appears to be doing well currently in regard to her wound which I think is making progress is still hurting her quite significantly. Fortunately I do not see any signs of active infection locally or systemically which is great news. No fevers, chills, nausea, vomiting, or diarrhea. 09-11-2022 upon evaluation today patient's wound actually is showing  signs of being a little bit smaller and looking a little bit better she still has a lot going on here however. Fortunately I do not see any evidence of active infection Worsening locally nor systemically which is great news. With that being said worsening still continue to use the topical Keystone antibiotics. 09-18-2022 upon evaluation today patient actually showing some signs of improvement. I am actually very pleased with where we stand compared to where we have been. I think that she is making good headway here. She is still having quite a bit of pain but it seems to be lessening compared to previous. 09-25-2022 upon evaluation patient's wound actually showing signs slowly but surely of improvement. Fortunately I do not see any signs of active infection at this time systemically and locally I feel like this is dramatically improved. She is doing well with the [facility] topical antibiotics. 10-02-2022 upon evaluation today patient appears to be doing better in regard to her wound overall. I am very pleased with where things stand from a visual standpoint I do not see any signs of active infection and in general I think that we are moving in the right direction here. 10-09-2022 upon evaluation today patient appears to be doing well currently in regard to her wound. She has been tolerating the dressing changes without complication. Fortunately I do not see any signs of active infection at this time which is great news. 10-16-2022 upon evaluation today patient continues to have quite a bit of pain in regard to her right leg. We have been attempting to debride little by little as we could but again was pretty limited by the fact that she is having significant discomfort. We do not actually have any signs of active infection going on at this point that the [facility] topical antibiotics have been helping quite readily. I been attempting to do as much debridement as I can but if she were to have pain medication  this would actually help and in the past when she did it was much more  tolerable for her as far as taking the edge off. Right now she has been without as she is in transition from her previous pain management physician to someone new to manage this. 10-23-2022 upon evaluation patient appears to be doing excellent in regard to her wound. She has been tolerating the dressing changes without complication. Fortunately I do not see any evidence of active infection locally nor systemically which is great news. No fevers, chills, nausea, vomiting, or diarrhea. 7/24; patient with a wound secondary to chronic venous insufficiency on the right lateral lower leg. We have been using Keystone antibiotic Hydrofera Blue under kerlix Coban. She complains of a lot of pain after last week's debridement otherwise things appear to be improved per discussion with our intake nurse. 11-06-2022 upon evaluation today patient appears to be doing excellent at this point in regard to her wound. She has been tolerating the dressing changes without complication and to be honest she is making excellent progress towards complete closure. I am actually very pleased with where we stand I think that she is doing excellent as far as the overall appearance of the wound is concerned as well. She is going require some debridement however. 11-20-2022 upon evaluation today patient appears to be doing well currently in regard to her wound. She has been tolerating the dressing changes without complication. Fortunately there does not appear to be any signs of active infection locally or systemically which is great news and in general I do believe that we are moving in the right direction here. No fevers, chills, nausea, vomiting, or diarrhea. 11-27-2022 upon evaluation today patient appears to be doing a little better in regards to the overall appearance of her wound but unfortunately she is having increased pain. The collagen seems to be getting  very dry and stuck to the wound bed which is causing her some issues here. Fortunately I do not see any signs of active infection locally nor systemically at this time. Fortunately I think that her wound is better but unfortunately her pain has not. For that reason I am going to avoid any debridement today since the patient is very upset about the pain and how bad this is hurting at this point. I think we can have to try something a little bit different I am thinking PolyMem may be a good option. Electronic Signature(s) Signed: 11/27/2022 11:26:06 AM By: Jamie Derry PA-C Entered By: Jamie [name] on 11/27/2022 08:26:06 -------------------------------------------------------------------------------- Physical Exam Details Patient Name: Date of Service: Jamie [name]. 11/27/2022 10:15 A M Medical Record Number: [id number] Patient Account Number: [id number] Date of Birth/Sex: Treating RN: Sep 03, 1940 (82 y.o. F) Primary Care Provider: Delorise [name] Other Clinician: Referring Provider: Treating Provider/Extender: Jamie [name], Jamie [name] in Treatment: 63 Constitutional Well-nourished and well-hydrated in no acute distress. Respiratory normal breathing without difficulty. Psychiatric Jamie [name], Jamie [name] ([id number]) [id number] Page 4 of 10 this patient is able to make decisions and demonstrates good insight into disease process. Alert and Oriented x 3. pleasant and cooperative. Notes Upon inspection patient's wound bed actually showed signs of good granulation and epithelization at this point. Fortunately I do not see any signs of active infection locally or systemically which is great news and in general I do believe that we are making good headway towards complete closure. Electronic Signature(s) Signed: 11/27/2022 11:26:21 AM By: Jamie Derry PA-C Entered By: Jamie [name] on 11/27/2022  08:26:21 -------------------------------------------------------------------------------- Physician Orders Details Patient Name: Date of Service: Jamie Brink, MA [name] 11/27/2022  10:15 A M Medical Record Number: [id number] Patient Account Number: [id number] Date of Birth/Sex: Treating RN: 1940/11/14 (82 y.o. 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 Leonard Schwartz Wednesday Room 8 or 9 weekly 12/04/22 at 10:15am Return A ppointment in 2 [name]. Leonard Schwartz Wednesday Room 8 or 9 weekly 12/11/22 at 10:15am Return A Return appointment in 3 [name]. Leonard Schwartz Wednesday Room 8 12/18/22 at 10:15am Other: - home health to wash right leg and foot with soap (dial soap) and water with dressing changes. Patient to have soap and towels next to her when Home Health arrives at the house. *****[facility] compounding topical antibiotics. FOLLOW THE PHARMACY INSTRUCTIONS ON HOW TO MIX, and apply under the Collagen.****** ****PLEASE APPLY ZINC OXIDE as needed AROUND THE PERIWOUND DUE TO MACERATION. (when macerated) ****Patient to bring in the [facility] topical antibiotics to each wound care appt as well.***** *****HOME HEALTH TO PLEASE WRAP FROM BASE OF TOES TO JUST BELOW KNEE. please appy first layer of unna boot at the bast of the toes and just below the knee to help hold the compression wrap in place.**** Anesthetic Wound #5 Right,Lateral Lower Leg (In clinic) Topical  Lidocaine 5% applied to wound bed (In clinic) Topical Lidocaine 4% applied to wound bed Bathing/ Shower/ Hygiene May shower and wash wound with soap and water. - with dressing changes. Edema Control - Lymphedema / SCD / Other Right Lower Extremity Elevate legs to the level of the heart or above for 30 minutes daily and/or when sitting for 3-4 times a day throughout the day. Avoid standing for long periods of time. Exercise regularly Home Health Wound #5 Right,Lateral Lower Leg New wound care orders this week; continue Home Health for wound care. May utilize formulary equivalent dressing for wound treatment orders unless otherwise specified. - Primary dressing is POLYMEM max Other Home Health Orders/Instructions: - [facility] [place], [place] fax # [phone]. Wound Treatment Wound #5 - Lower Leg Wound Laterality: Right, Lateral Cleanser: Soap and Water 3 x Per Week/30 Days Discharge Instructions: May shower and wash wound with dial antibacterial soap and water prior to dressing change. Jamie [name], Jamie [name] ([id number]) [id number] Page 5 of 10 Peri-Wound Care: Zinc Oxide Ointment 30g tube 3 x Per Week/30 Days Discharge Instructions: Apply Zinc Oxide to periwound with each dressing change TO ANY MACERATED SKIN. Peri-Wound Care: Sween Lotion (Moisturizing lotion) 3 x Per Week/30 Days Discharge Instructions: Apply moisturizing lotion as directed Topical: compounding topical antibiotics 3 x Per Week/30 Days Discharge Instructions: apply Keystone directly to wound bed under the hydrofera blue. Home Health start. MIX ACCORDING TO THE PHARMACY INSTRUCTIONS. Prim Dressing: PolyMem Non-Adhesive Dressing, 4x4 in 3 x Per Week/30 Days ary Discharge Instructions: Apply to wound bed as instructed Secondary Dressing: ABD Pad, 8x10 3 x Per Week/30 Days Discharge Instructions: Apply over primary dressing as directed. Secondary Dressing: Woven Gauze Sponge,  Non-Sterile 4x4 in 3 x Per Week/30 Days Discharge Instructions: Apply over primary dressing as directed. Secondary Dressing: Zetuvit Plus 4x8 in 3 x Per Week/30 Days Discharge Instructions: Or double ABD pad Apply over  primary dressing as directed. Compression Wrap: Kerlix Roll 4.5x3.1 (in/yd) 3 x Per Week/30 Days Discharge Instructions: Apply Kerlix and Coban compression as directed. Compression Wrap: Coban Self-Adherent Wrap 4x5 (in/yd) 3 x Per Week/30 Days Discharge Instructions: Apply over Kerlix as directed. Compression Wrap: unna boot FIRST LAYER **** SEE INSTRUCTIONS 3 x Per Week/30 Days Discharge Instructions: APPLY FIRST LAYER UNNA BOOT AT BASES OF TOES AND JUST BELOW THE KNEE TO HOLD COMPRESSION WRAP IN PLACE. Electronic Signature(s) Signed: 11/27/2022 12:42:57 PM By: Karie Schwalbe RN Signed: 11/27/2022 5:16:52 PM By: Jamie Derry PA-C Entered By: Karie Schwalbe on 11/27/2022 08:02:15 -------------------------------------------------------------------------------- Problem List Details Patient Name: Date of Service: Jamie [name], [name] 11/27/2022 10:15 A M Medical Record Number: [id number] Patient Account Number: [id number] Date of Birth/Sex: Treating RN: 1941/01/19 (82 y.o. 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 05/22/2022 No Yes lower extremity L97.812 Non-pressure chronic ulcer of other part of right lower leg with fat layer 05/22/2022 No Yes exposed I25.10 Atherosclerotic heart disease of native coronary artery without angina pectoris 05/22/2022 No Yes Jamie [name], Jamie [name] ([id number]) [id number] Page 6 of 10 Inactive Problems Resolved  Problems Electronic Signature(s) Signed: 11/27/2022 10:06:53 AM By: Jamie Derry PA-C Entered By: Jamie [name] on 11/27/2022 07:06:53 -------------------------------------------------------------------------------- Progress Note Details Patient Name: Date of Service: Jamie [name], [name] 11/27/2022 10:15 A M Medical Record Number: [id number] Patient Account Number: [id number] Date of Birth/Sex: Treating RN: [birth year] (82 y.o. F) Primary Care Provider: Delorise [name] Other Clinician: Referring Provider: Treating Provider/Extender: Jamie [name], Jamie [name] in Treatment: 27 Subjective Chief Complaint Information obtained from Patient Right LE Ulcer History of Present Illness (HPI) ADMISSION 06/30/2020 Mrs. Wessner is an 82 year old woman who lives in [place]. She is here with her niece for review of wounds on the left medial lower leg and ankle. These have apparently been present for over a year and she followed with Jamie [name] at the [facility] in [place] for quite a period of time although it looks as though there was a initial consult wound from Jamie [name] on February 18 presumably there was therefore hiatus. At that point the wounds were described as being there for 3 months. She also tells me she was at the wound care center in [place] for a period of time with this. 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 She had venous studies that were negative for DVT ABIs on the right were 1.01 on the left 1.06. She has . had previous applications of puraply, compression which she does not tolerate very well. She has had several rounds of oral antibiotic therapy. She complains of unrelenting pain and she is seeing Jamie [name] V of pain management apparently was on oxycodone but that did not help. She also had a skin biopsy done by Jamie [name] although we do not have that result. She does not appear to have an  arterial issue. I am not completely clear what she has been putting on the wounds lately. 3/31; this patient has a particularly nasty set of wounds on the left medial ankle in the middle of what looks to be hemosiderin deposition secondary to chronic venous insufficiency. She has a lot of pain followed by pain management. Jamie [name] apparently did a biopsy  of something on the left leg last fall what I would like to get this result. She has a history of MRSA treatment. I do not believe she had reflux studies but she did have DVT rule out studies. Previous ABIs have not suggested arterial insufficiency 4/7; difficult wounds on the left medial ankle probably chronic venous insufficiency. With considerable effort on behalf of our case manager we were able to finally to speak to somebody at the hospital in [place] who indicated that no biopsy of this area have been done even though the patient describes this in some detail and is even able to point out where she thinks the biopsy was done. We have been using Sorbact. The PCR culture I did showed polymicrobial identification with Pseudomonas, staph aureus, Peptostreptococcus. All of this and low titers. Resistance genes identified were MRSA, staph virulence gene and tetracycline. We are going to send this to [facility] for a topical antibiotic which is something that we have had good success with recently in large venous ulcers with a lot of purulent drainage. There would not be an easy oral alternative here possibly line escalated and ciprofloxacin if we need to use systemic antibiotic 4/15; difficult area on the left medial ankle. Most likely chronic venous insufficiency. I think she will probably need venous reflux study I think she had DVT rule outs but not venous reflux studies. We have not yet obtained the topical antibiotics. She has home health changing the dressing we have been using Sorbact for adherent fibrinous debris on the surface. Very difficult  to remove 4/22; patient presents for 1 week follow-up. She has been using sore back under compression wraps and these are changed 3 times a week with home health. She also had Keystone antibiotics sent to her house and brought them in today. She has no complaints or issues today. 5/2; patient is here for follow-up. She has been using Sorbact under compression. Very painful wound. She has been using Keystone antibiotics. Not much improvement although the more medial part of the wound has cleaned up nicely and the larger part of the wound about 50% slough covered. Part of the issue here is that she had stays at both [facility] wound care center, [facility] wound care center and now [place]. Not sure if she has had venous reflux studies. As far as we are able to tell she did not have a biopsy. My notes state that she did not have venous reflux studies just DVT rule outs. 5/16; patient goes for venous reflux studies this afternoon. She says that wound was biopsied which sounds like punch biopsies by Dr. Lynden Ang we do not have these results. We are using Sorbact. Very difficult wound to debride 5/23; patient presents for 1 week follow-up. She reports tolerating the wraps well with sorbact underneath. She had ABIs and venous reflux studies done. She has no issues or complaints today. She denies acute signs of infection. 6/6; I have reviewed the patient's vascular studies. Wounds are on the left medial and posterior calf. She had significant reflux in the greater saphenous vein in the in the mid thigh, distal thigh knee and the small saphenous vein in the popliteal fossa. The vein diameters do not look too impressive though. She is going to see the vascular surgeon on Wednesday. She also had venous reflux in the right common femoral vein. She did not have any evidence of a DVT or SVT I am wondering whether there is an ablation procedure that would benefit her in  the greater saphenous vein on the left. . She tells [place]  that home health put the dressing on too tight and she took off 1 layer. The swelling in her left leg is a little worse as a result of this. Not much change in the wound measurements so the surface of the wound looks better Jamie [name], Jamie [name] ([id number]) [id number] Page 7 of 10 Her arterial studies showed an ABI on the right of 1.14 with a triphasic waveform and a great toe pressure of 0.89. On the left her ABIs were noncompressible at 1.34 but with triphasic waveforms and a TBI of 0.97. Her greater toe pressure was 122. There was no evidence of significant bilateral arterial disease 6/20 patient went to see Dr. Durwin Nora. He did not think she had significant arterial disease. In terms of her venous duplex on the right side there was no evidence of a DVT or SVT there was deep venous reflux involving the common femoral vein no superficial vein reflux on the left side there was no DVT or SVT there was no deep vein graft reflux there was some reflux in the greater saphenous vein from the mid thigh to the knee but the vein here was not dilated. He thought these were venous wounds he prescribed a compression pump but I am not sure who we ordered it from The patient has been approved for Apligraf. Still using silver collagen this week 7/5; Apligraf #1 7/19 Apligraf #2. Decent improvement in the condition of the wound bed. Epithelialization distal 8/2 Apligraf #3. No issues or complaints. Denies signs of infection. 8/17 Apligraf #4. No issues or concerns. Some complaints of pruritus and the rash. Our intake nurse brought up the fact that she had previously indicated possible cotton layer sensitivity we will therefore use kerlix in the bottom layer of the compression 8/30; the patient comes in with the area on her left medial leg just about healed. There is a superficial area more towards the tibia and a smaller open area distally everything else is epithelialized. I do not think  she requires another Apligraf 9/13; left medial leg is healed. 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 Her edema control is good Readmisstion: 05-22-2022 upon evaluation today patient presents for initial inspection here in our clinic concerning issues that she has been having with a wound of the right lateral lower extremity. This is an area of a previous skin graft she tells me. With that being said she unfortunately had a scrape on this that occurred around 10 January. Since that time she has noted that this has just continued to get bigger and bigger in her niece who is present with her today actually states that 2 [name] ago when she saw that it was significantly smaller than what she sees currently. Obviously this is of utmost concern as we do not want this to continue to get larger when arrested and get moving in the right direction. Fortunately there does not appear to be any signs of systemic infection though locally I think we probably do have some infection present. I would obtain a culture to see what we have going on here and then we will subsequently see where things go going forward. Fortunately I think that she is in the right place at this point being at the wound center we can definitely do something to try to get this moving in the right direction. Patient does have a history of chronic  venous insufficiency as well as coronary artery disease. In the past she has done well with compression wraps on the start with a 3 layer wrap I think she is probably can end up going to a 4-layer at some point but we will see how things do over the next week. She has been using wound cleanser along with she tells me a zinc and topical but again I am not sure exactly what that was. 05-29-2022 upon evaluation today patient appears to be doing well currently in regard to her wound. This actually is showing signs of improvement I am happy  in that regard unfortunately her left leg has an area on the shin that has opened. This is the region where one of the areas at least we have previously taken care of. Nonetheless this is small hoping we get it under control before things worsen significantly. 06-05-2022 upon evaluation today patient appears to be doing well currently in regard to her wounds. The actually seem to be fairly clean she is having still quite a bit of problem with pain at this point she would like to not have debridement today for all possible. With that being said I do believe that we are making some good progress I think the [facility] is doing a decent job here. 3/6; patient has had 3/6; patient with known severe chronic venous insufficiency. She apparently had a traumatic wound at home and has a reasonably substantial wound on the right lateral lower leg and a smaller one on the left anterior lower leg as well. We have been using Hydrofera Blue and Unna boots 3/13; patient presents for follow-up. We have been using Hydrofera Blue under Coflex to the legs bilaterally. She has no issues or complaints today. 06-26-2022 upon evaluation today patient appears to be doing well currently in regard to her wound. This is measuring a little bit larger however. Fortunately I do not see any signs of infection this is on the right side. Fortunately the left side is actually completely healed which is great news. 07-03-2022 patient's wound unfortunately is continuing to show signs of being worse. Her compression which was the Tubigrip that I thought should be able to pull up actually slipped down and she was not able to pull that back up. With that being said that means that unfortunately her wound has continued to deteriorate since I last saw her. This is definitely not the direction that we are looking for. I discussed with her that I do believe we need to go ahead and see about getting her started on antibiotics and I subsequently  would also like to go ahead and see about getting things moving forward with regard to a wound culture and making a change up her dressings as well but this can be changed more frequently than just once a week. 07-10-2022 upon evaluation today patient actually appears to be doing much better. I do believe that the antibiotics have been beneficial for her. Fortunately I do not see any signs of active infection locally nor systemically which is great news. No fevers, chills, nausea, vomiting, or diarrhea. 07-17-2022 upon evaluation today patient appears to be doing well currently in regard to her wound which is actually showing signs of improvement this is slow but nonetheless prevalent that we are seeing good improvement here. Fortunately I do not see any signs of active infection locally nor systemically which is great news. 07-24-2022 upon evaluation today patient appears to be doing well currently in regard  to her wound although the wrap that we had on has been slipping down and she really does not have anybody to help her with this at home health is not going to be coming out we could not get anybody that would actually be able to do the dressing changes. Fortunately I do not see any signs of active infection locally nor systemically which is great news. 08-07-22 poorly in regard to her leg compared to what it was previous. Fortunately there does not appear to be any signs of active infection locally nor systemically which is great news. No fevers, chills, nausea, vomiting, or diarrhea. With that being said it does appear that the patient may have some cellulitis in the leg which is not good. 08-14-2022 upon evaluation today patient appears to be doing somewhat better in regard to her wounds she still is having quite a bit of pain we are still not where we want to be as far as healing is concerned completely. Fortunately I do not see any evidence of active infection locally nor systemically which is great  news and I am very pleased in that regard. 08-21-23 upon evaluation today patient appears to be doing poorly currently in regard to her wound. She has been tolerating the dressing changes without complication. Fortunately there does not appear to be any signs of active infection locally nor systemically at this time. 08-28-2022 upon evaluation today patient appears to be doing poorly in regard to her wound this was not wrapped appropriately home health did not go up high enough on the wrap. This has caused some issues and I discussed that with the patient today. I do believe that she is going to require aggressive wrapping and treatment which she is getting the [name] topical antibiotics today they can start using that at the next wrap on Friday. In the meantime they need to make sure that the rapid and appropriately we wrote very specific orders today. 09-04-2022 upon evaluation today patient appears to be doing well currently in regard to her wound which I think is making progress is still hurting her quite significantly. Fortunately I do not see any signs of active infection locally or systemically which is great news. No fevers, chills, nausea, vomiting, or diarrhea. 09-11-2022 upon evaluation today patient's wound actually is showing signs of being a little bit smaller and looking a little bit better she still has a lot going on here however. Fortunately I do not see any evidence of active infection Worsening locally nor systemically which is great news. With that being said worsening still continue to use the topical Keystone antibiotics. 09-18-2022 upon evaluation today patient actually showing some signs of improvement. I am actually very pleased with where we stand compared to where we have been. I think that she is making good headway here. She is still having quite a bit of pain but it seems to be lessening compared to previous. Jamie [name], Jamie [name] ([id number])  [id number] Page 8 of 10 09-25-2022 upon evaluation patient's wound actually showing signs slowly but surely of improvement. Fortunately I do not see any signs of active infection at this time systemically and locally I feel like this is dramatically improved. She is doing well with the [facility] topical antibiotics. 10-02-2022 upon evaluation today patient appears to be doing better in regard to her wound overall. I am very pleased with where things stand from a visual standpoint I do not see any signs of active infection and in general I think that we  are moving in the right direction here. 10-09-2022 upon evaluation today patient appears to be doing well currently in regard to her wound. She has been tolerating the dressing changes without complication. Fortunately I do not see any signs of active infection at this time which is great news. 10-16-2022 upon evaluation today patient continues to have quite a bit of pain in regard to her right leg. We have been attempting to debride little by little as we could but again was pretty limited by the fact that she is having significant discomfort. We do not actually have any signs of active infection going on at this point that the [facility] topical antibiotics have been helping quite readily. I been attempting to do as much debridement as I can but if she were to have pain medication this would actually help and in the past when she did it was much more tolerable for her as far as taking the edge off. Right now she has been without as she is in transition from her previous pain management physician to someone new to manage this. 10-23-2022 upon evaluation patient appears to be doing excellent in regard to her wound. She has been tolerating the dressing changes without complication. Fortunately I do not see any evidence of active infection locally nor systemically which is great news. No fevers, chills, nausea, vomiting, or  diarrhea. 7/24; patient with a wound secondary to chronic venous insufficiency on the right lateral lower leg. We have been using Keystone antibiotic Hydrofera Blue under kerlix Coban. She complains of a lot of pain after last week's debridement otherwise things appear to be improved per discussion with our intake nurse. 11-06-2022 upon evaluation today patient appears to be doing excellent at this point in regard to her wound. She has been tolerating the dressing changes without complication and to be honest she is making excellent progress towards complete closure. I am actually very pleased with where we stand I think that she is doing excellent as far as the overall appearance of the wound is concerned as well. She is going require some debridement however. 11-20-2022 upon evaluation today patient appears to be doing well currently in regard to her wound. She has been tolerating the dressing changes without complication. Fortunately there does not appear to be any signs of active infection locally or systemically which is great news and in general I do believe that we are moving in the right direction here. No fevers, chills, nausea, vomiting, or diarrhea. 11-27-2022 upon evaluation today patient appears to be doing a little better in regards to the overall appearance of her wound but unfortunately she is having increased pain. The collagen seems to be getting very dry and stuck to the wound bed which is causing her some issues here. Fortunately I do not see any signs of active infection locally nor systemically at this time. Fortunately I think that her wound is better but unfortunately her pain has not. For that reason I am going to avoid any debridement today since the patient is very upset about the pain and how bad this is hurting at this point. I think we can have to try something a little bit different I am thinking PolyMem may be a good option. Objective Constitutional Well-nourished and  well-hydrated in no acute distress. Vitals Time Taken: 10:42 AM, Height: 62 in, Weight: 171 lbs, BMI: 31.3, Temperature: 98.6 F, Pulse: 50 bpm, Respiratory Rate: 16 breaths/min, Blood Pressure: 104/64 mmHg. Respiratory normal breathing without difficulty. Psychiatric this patient is  able to make decisions and demonstrates good insight into disease process. Alert and Oriented x 3. pleasant and cooperative. General Notes: Upon inspection patient's wound bed actually showed signs of good granulation and epithelization at this point. Fortunately I do not see any signs of active infection locally or systemically which is great news and in general I do believe that we are making good headway towards complete closure. Integumentary (Hair, Skin) Wound #5 status is Open. Original cause of wound was Trauma. The date acquired was: 04/17/2022. The wound has been in treatment 27 [name]. The wound is located on the Right,Lateral Lower Leg. The wound measures 12cm length x 7.5cm width x 0.2cm depth; 70.686cm^2 area and 14.137cm^3 volume. There is Fat Layer (Subcutaneous Tissue) exposed. There is no tunneling or undermining noted. There is a medium amount of serosanguineous drainage noted. The wound margin is distinct with the outline attached to the wound base. There is medium (34-66%) red granulation within the wound bed. There is a medium (34-66%) amount of necrotic tissue within the wound bed including Eschar and Adherent Slough. The periwound skin appearance exhibited: Scarring, Dry/Scaly, Hemosiderin Staining. The periwound skin appearance did not exhibit: Callus, Crepitus, Excoriation, Induration, Rash, Maceration, Atrophie Blanche, Cyanosis, Ecchymosis, Mottled, Pallor, Rubor, Erythema. Periwound temperature was noted as No Abnormality. The periwound has tenderness on palpation. 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 Leonard Schwartz Wednesday Room 8 or 9 weekly 12/04/22 at 10:15am Return Appointment in 2 [name]. Leonard Schwartz Wednesday Room 8 or 9 weekly 12/11/22 at 10:15am Return appointment in 3 [name]. Leonard Schwartz Wednesday Room 8 12/18/22 at 10:15am Other: - home health to wash right leg and foot with soap (dial soap) and water with dressing changes. Patient to have soap and towels next to her when Home Health arrives at the house. *****[facility] compounding topical antibiotics. FOLLOW THE PHARMACY INSTRUCTIONS ON HOW TO MIX, and apply under the Collagen.****** ****PLEASE APPLY ZINC OXIDE as needed AROUND THE PERIWOUND DUE TO MACERATION. (when macerated) ****Patient to bring in the [facility] topical antibiotics to each wound care appt as well.***** *****HOME HEALTH TO PLEASE WRAP FROM BASE OF TOES TO JUST BELOW KNEE. please appy first layer of unna boot at the bast of the toes and just below the knee to help hold the compression wrap in place.**** Anesthetic: Wound #5 Right,Lateral Lower Leg: (In clinic) Topical Lidocaine 5% applied to wound bed (In clinic) Topical Lidocaine 4% applied to wound bed Bathing/ Shower/ Hygiene: May shower and wash wound with soap and water. - with dressing changes. Edema Control - Lymphedema / SCD / Other: Elevate legs to the level of the heart or above for 30 minutes daily and/or when sitting for 3-4 times a day throughout the day. Avoid standing for long periods of time. Exercise regularly Home Health: Wound #5 Right,Lateral Lower Leg: New wound care orders this week; continue Home Health for wound care. May utilize formulary equivalent dressing for wound treatment orders  unless otherwise specified. - Primary dressing is POLYMEM max Other Home Health Orders/Instructions: - [facility] in [place], [place] fax # [phone]. WOUND #5: - Lower Leg Wound Laterality: Right, Lateral Cleanser: Soap and Water 3 x Per Week/30 Days Discharge  Instructions: May shower and wash wound with dial antibacterial soap and water prior to dressing change. Peri-Wound Care: Zinc Oxide Ointment 30g tube 3 x Per Week/30 Days Discharge Instructions: Apply Zinc Oxide to periwound with each dressing change TO ANY MACERATED SKIN. Peri-Wound Care: Sween Lotion (Moisturizing lotion) 3 x Per Week/30 Days Discharge Instructions: Apply moisturizing lotion as directed Topical: compounding topical antibiotics 3 x Per Week/30 Days Discharge Instructions: apply Keystone directly to wound bed under the hydrofera blue. Home Health start. MIX ACCORDING TO THE PHARMACY INSTRUCTIONS. Prim Dressing: PolyMem Non-Adhesive Dressing, 4x4 in 3 x Per Week/30 Days ary Discharge Instructions: Apply to wound bed as instructed Secondary Dressing: ABD Pad, 8x10 3 x Per Week/30 Days Discharge Instructions: Apply over primary dressing as directed. Secondary Dressing: Woven Gauze Sponge, Non-Sterile 4x4 in 3 x Per Week/30 Days Discharge Instructions: Apply over primary dressing as directed. Secondary Dressing: Zetuvit Plus 4x8 in 3 x Per Week/30 Days Discharge Instructions: Or double ABD pad Apply over primary dressing as directed. Com pression Wrap: Kerlix Roll 4.5x3.1 (in/yd) 3 x Per Week/30 Days Discharge Instructions: Apply Kerlix and Coban compression as directed. Com pression Wrap: Coban Self-Adherent Wrap 4x5 (in/yd) 3 x Per Week/30 Days Discharge Instructions: Apply over Kerlix as directed. Com pression Wrap: unna boot FIRST LAYER **** SEE INSTRUCTIONS 3 x Per Week/30 Days Discharge Instructions: APPLY FIRST LAYER UNNA BOOT AT BASES OF TOES AND JUST BELOW THE KNEE TO HOLD COMPRESSION WRAP IN  PLACE. 1. I am going to recommend that we have the patient continue to monitor for any signs of infection or worsening. Based on what I am seeing at I do believe that we should switch over to [facility] to see how this does. She is in agreement with that plan. 2. I am also can recommend that the patient should continue with an ABD pad to cover followed by the Unna boot wrap at this point to help with edema control. 3 muscle can recommend she should continue to elevate her legs much as possible more that she can do the better in my opinion. We will see patient back for reevaluation in 1 week here in the clinic. If anything worsens or changes patient will contact our office for additional recommendations. Electronic Signature(s) Signed: 11/27/2022 11:27:19 AM By: Jamie Derry PA-C Entered By: Jamie [name] on 11/27/2022 08:27:18 -------------------------------------------------------------------------------- SuperBill Details Patient Name: Date of Service: Jamie [name], Christean Grief. 11/27/2022 Medical Record Number: [id number] Patient Account Number: [id number] Date of Birth/Sex: Treating RN: March 28, 1941 (82 y.o. F) Primary Care Provider: Delorise [name] Other Clinician: Referring Provider: Treating Provider/Extender: Jamie [name], [name] in Treatment: [address] [place], [place] W ([id number]) [id number] Page 10 of 10 ICD-10 Codes Code Description I87.331 Chronic venous hypertension (idiopathic) with ulcer and inflammation of right lower extremity 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d I25.10 Atherosclerotic heart disease of native coronary artery without angina pectoris Facility Procedures : CPT4 Code: 86578469 Description: 99213 - WOUND CARE VISIT-LEV 3 EST PT Modifier: Quantity: 1 Physician Procedures : CPT4 Code Description Modifier [id number] - WC PHYS LEVEL  3 - EST PT ICD-10 Diagnosis Description I87.331 Chronic venous hypertension (idiopathic) with ulcer and inflammation of right lower extremity L97.812 Non-pressure chronic ulcer of other part  of right lower leg with fat layer exposed I25.10 Atherosclerotic heart disease of native coronary artery without angina pectoris Quantity: 1 Electronic Signature(s) Signed: 11/27/2022 12:42:57 PM By: Karie Schwalbe RN Signed:  11/27/2022 5:16:52 PM By: Jamie Derry PA-C Previous Signature: 11/27/2022 11:28:06 AM Version By: Jamie Derry PA-C Entered By: Karie Schwalbe on 11/27/2022 09:37:01

## 2022-11-28 NOTE — Progress Notes (Signed)
Jamie [name] ([id number]) [id number] Page 1 of 8 Visit Report for 11/27/2022 Arrival Information Details Patient Name: Date of Service: Jamie [name], [name]. 11/27/2022 10:15 A M Medical Record Number: [id number] Patient Account Number: [id number] Date of Birth/Sex: Treating RN: 11/26/1940 (82 y.o. F) Primary Care Bolivar Koranda: [name] Other Clinician: Referring Shellyann Wandrey: Treating Jaleesa Cervi/Extender: [name], [name] in Treatment: 27 Visit Information History Since Last Visit Added or deleted any medications: No Patient Arrived: Wheel Chair Any new allergies or adverse reactions: No Arrival Time: 10:19 Had a fall or experienced change in No Accompanied By: niece activities of daily living that may affect Transfer Assistance: None risk of falls: Patient Identification Verified: Yes Signs or symptoms of abuse/neglect since last visito No Secondary Verification Process Completed: Yes Hospitalized since last visit: No Patient Requires Transmission-Based Precautions: No Implantable device outside of the clinic excluding No Patient Has Alerts: No cellular tissue based products placed in the center since last visit: Has Dressing in Place as Prescribed: Yes Has Compression in Place as Prescribed: Yes Pain Present Now: Yes Electronic Signature(s) Signed: 11/28/2022 5:02:38 PM By: Thayer Dallas Entered By: Thayer Dallas on 11/27/2022 07:29:53 -------------------------------------------------------------------------------- Clinic Level of Care Assessment Details Patient Name: Date of Service: Jamie [name], [name]. 11/27/2022 10:15 A M Medical Record Number: [id number] Patient Account Number: [id number] Date of Birth/Sex: Treating RN: [birth year] (82 y.o. Jamie [name] Primary Care Marquasia Schmieder: [name] Other Clinician: Referring Joycelynn Fritsche: Treating Dreya Buhrman/Extender: [name], [name] in Treatment: 27 Clinic Level of Care Assessment Items TOOL 4 Quantity Score X- 1 0 Use when only an EandM is performed on FOLLOW-UP visit ASSESSMENTS - Nursing Assessment / Reassessment X- 1 10 Reassessment of Co-morbidities (includes updates in patient status) X- 1 5 Reassessment of Adherence to Treatment Plan ASSESSMENTS - Wound and Skin A ssessment / Reassessment X - Simple Wound Assessment / Reassessment - one wound 1 5 []  - 0 Complex Wound Assessment / Reassessment - multiple wounds []  - 0 Dermatologic / Skin Assessment (not related to wound area) ASSESSMENTS - Focused Assessment []  - 0 Circumferential Edema Measurements - multi extremities []  - 0 Nutritional Assessment / Counseling / Intervention LOREEN, TYMA ([id number]) [id number] Page 2 of 8 []  - 0 Lower Extremity Assessment (monofilament, tuning fork, pulses) []  - 0 Peripheral Arterial Disease Assessment (using hand held doppler) ASSESSMENTS - Ostomy and/or Continence Assessment and Care []  - 0 Incontinence Assessment and Management []  - 0 Ostomy Care Assessment and Management (repouching, etc.) PROCESS - Coordination of Care X - Simple Patient / Family Education for ongoing care 1 15 []  - 0 Complex (extensive) Patient / Family Education for ongoing care X- 1 10 Staff obtains Chiropractor, Records, T Results / Process Orders est X- 1 10 Staff telephones HHA, Nursing Homes / Clarify orders / etc []  - 0 Routine Transfer to another Facility (non-emergent condition) []  - 0 Routine Hospital Admission (non-emergent condition) []  - 0 New Admissions / Manufacturing engineer / Ordering NPWT Apligraf, etc. , []  - 0 Emergency Hospital Admission (emergent condition) X- 1 10 Simple Discharge Coordination []  - 0 Complex (extensive) Discharge Coordination PROCESS - Special Needs []  - 0 Pediatric / Minor Patient Management []  - 0 Isolation  Patient Management []  - 0 Hearing / Language / Visual special needs []  - 0 Assessment of Community assistance (transportation, D/C planning, etc.) []  - 0 Additional assistance / Altered mentation []  - 0 Support Surface(s) Assessment (bed, cushion, seat, etc.) INTERVENTIONS -  Wound Cleansing / Measurement X - Simple Wound Cleansing - one wound 1 5 []  - 0 Complex Wound Cleansing - multiple wounds X- 1 5 Wound Imaging (photographs - any number of wounds) []  - 0 Wound Tracing (instead of photographs) X- 1 5 Simple Wound Measurement - one wound []  - 0 Complex Wound Measurement - multiple wounds INTERVENTIONS - Wound Dressings X - Small Wound Dressing one or multiple wounds 1 10 []  - 0 Medium Wound Dressing one or multiple wounds []  - 0 Large Wound Dressing one or multiple wounds []  - 0 Application of Medications - topical []  - 0 Application of Medications - injection INTERVENTIONS - Miscellaneous []  - 0 External ear exam []  - 0 Specimen Collection (cultures, biopsies, blood, body fluids, etc.) []  - 0 Specimen(s) / Culture(s) sent or taken to Lab for analysis []  - 0 Patient Transfer (multiple staff / Nurse, adult / Similar devices) []  - 0 Simple Staple / Suture removal (25 or less) []  - 0 Complex Staple / Suture removal (26 or more) []  - 0 Hypo / Hyperglycemic Management (close monitor of Blood Glucose) JOLANDA, AZIZI ([id number]) [id number] Page 3 of 8 []  - 0 Ankle / Brachial Index (ABI) - do not check if billed separately X- 1 5 Vital Signs Has the patient been seen at the hospital within the last three years: Yes Total Score: 95 Level Of Care: New/Established - Level 3 Electronic Signature(s) Signed: 11/27/2022 12:42:57 PM By: Karie Schwalbe RN Entered By: Karie Schwalbe on 11/27/2022 09:36:50 -------------------------------------------------------------------------------- Encounter Discharge Information Details Patient Name: Date  of Service: Jamie [name], [name] 11/27/2022 10:15 A M Medical Record Number: [id number] Patient Account Number: [id number] Date of Birth/Sex: Treating RN: [birth year] (82 y.o. Jamie [name] Primary Care [name]: [name] Other Clinician: Referring Vernon Ariel: Treating Shady Bradish/Extender: [name], Billey Gosling in Treatment: 27 Encounter Discharge Information Items Discharge Condition: Stable Ambulatory Status: Wheelchair Discharge Destination: Home Transportation: Private Auto Accompanied By: caregiver Schedule Follow-up Appointment: Yes Clinical Summary of Care: Patient Declined Electronic Signature(s) Signed: 11/27/2022 12:42:57 PM By: Karie Schwalbe RN Entered By: Karie Schwalbe on 11/27/2022 09:38:06 -------------------------------------------------------------------------------- Lower Extremity Assessment Details Patient Name: Date of Service: Jamie [name], [name]. 11/27/2022 10:15 A M Medical Record Number: [id number] Patient Account Number: [id number] Date of Birth/Sex: Treating RN: 1940/07/14 (82 y.o. F) Primary Care Donnalee Cellucci: [name] Other Clinician: Referring Caprice Mccaffrey: Treating Knoah Nedeau/Extender: [name], [name] in Treatment: 27 Edema Assessment Assessed: [Left: No] [Right: No] Edema: [Left: Ye] [Right: s] Calf Left: Right: Point of Measurement: From Medial Instep 37 cm Ankle Left: Right: Point of Measurement: From Medial Instep 26 cm Vascular Assessment Left: [id number] Page 4 of 8Right:] Pulses: Dorsalis Pedis Palpable: [id number] Page 4 of 8Yes] Extremity colors, hair growth, and conditions: Extremity Color: [id number] Page 4 of 8Normal] Hair Growth on Extremity: [id number] Page 4 of 8No] Temperature of Extremity: [[id number]  Page 4 of 8Warm] Capillary Refill: [id number] Page 4 of 8< 3 seconds] Dependent Rubor: [[id number] Page 4 of 8No No] Toe Nail Assessment Left: Right: Thick: Yes Discolored: Yes Deformed: Yes Improper Length and Hygiene: Yes Electronic Signature(s) Signed: 11/27/2022 12:42:57 PM By: Karie Schwalbe RN Entered By: Karie Schwalbe on 11/27/2022 07:59:34 -------------------------------------------------------------------------------- Multi-Disciplinary Care Plan Details Patient Name: Date of Service: Jamie [name], [name] 11/27/2022 10:15 A M Medical Record Number: [id number] Patient Account Number: [id number] Date of Birth/Sex: Treating RN: Dec 04, 1940 (82 y.o. Jamie [name] Primary Care Yaseen Gilberg: [name] Other Clinician: Referring  Brittay Mogle: Treating Wayde Gopaul/Extender: [name] Weeks in Treatment: 27 Multidisciplinary Care Plan reviewed with physician Active Inactive Pain, Acute or Chronic Nursing Diagnoses: Pain Management - Cyclic Acute (Dressing Change Related) Pain Management - Non-cyclic Acute (Procedural) Pain, acute or chronic: actual or potential Goals: Patient will verbalize adequate pain control and receive pain control interventions during procedures as needed Date Initiated: 09/11/2022 Target Resolution Date: 02/06/2023 Goal Status: Active Patient/caregiver will verbalize adequate pain control between visits Date Initiated: 09/11/2022 Target Resolution Date: 02/06/2023 Goal Status: Active Patient/caregiver will verbalize comfort level met Date Initiated: 09/11/2022 Target Resolution Date: 02/06/2023 Goal Status: Active Interventions: Complete pain assessment as per visit requirements Encourage patient to take pain medications as prescribed Provide education on pain management Provision of support: recognize patient pain, provide comfort and support as  needed Reposition patient for comfort Treatment Activities: Administer pain control measures as ordered : 09/11/2022 Notes: SHYANE, DATTILO ([id number]) [phone].pdf Page 5 of 8 Electronic Signature(s) Signed: 11/27/2022 12:42:57 PM By: Karie Schwalbe RN Entered By: Karie Schwalbe on 11/27/2022 09:35:07 -------------------------------------------------------------------------------- Pain Assessment Details Patient Name: Date of Service: [name] 11/27/2022 10:15 A M Medical Record Number: [id number] Patient Account Number: [id number] Date of Birth/Sex: Treating RN: Jun 15, 1940 (82 y.o. F) Primary Care Vendetta Pittinger: [name] Other Clinician: Referring Candice Lunney: Treating Key Cen/Extender: [name], [name] in Treatment: 27 Active Problems Location of Pain Severity and Description of Pain Patient Has Paino Yes Site Locations Pain Location: Generalized Pain With Dressing Change: Yes Duration of the Pain. Constant / Intermittento Constant Rate the pain. Current Pain Level: 10 Worst Pain Level: Insensate Least Pain Level: 3 Tolerable Pain Level: 7 Character of Pain Describe the Pain: Difficult to Pinpoint Pain Management and Medication Current Pain Management: Medication: Yes Cold Application: No Rest: Yes Massage: No Activity: No T.E.N.S.: No Heat Application: No Leg drop or elevation: No Is the Current Pain Management Adequate: Adequate How does your wound impact your activities of daily livingo Sleep: No Bathing: No Appetite: No Relationship With Others: No Bladder Continence: No Emotions: No Bowel Continence: No Work: No Toileting: No Drive: No Dressing: No Hobbies: No Electronic Signature(s) Signed: 11/27/2022 12:42:57 PM By: Karie Schwalbe RN Entered By: Karie Schwalbe on 11/27/2022 07:59:09 [name] ([id number]) [id number] Page 6 of  8 -------------------------------------------------------------------------------- Patient/Caregiver Education Details Patient Name: Date of Service: Jamie [name], [place] 8/21/2024andnbsp10:15 A M Medical Record Number: [id number] Patient Account Number: [id number] Date of Birth/Gender: Treating RN: 06-Feb-1941 (82 y.o. Jamie [name] Primary Care Physician: [name] Other Clinician: Referring Physician: Treating Physician/Extender: [name], Billey Gosling in Treatment: 41 Education Assessment Education Provided To: Patient Education Topics Provided Wound/Skin Impairment: Methods: Explain/Verbal Responses: Return demonstration correctly Electronic Signature(s) Signed: 11/27/2022 12:42:57 PM By: Karie Schwalbe RN Entered By: Karie Schwalbe on 11/27/2022 09:35:22 -------------------------------------------------------------------------------- Wound Assessment Details Patient Name: Date of Service: [name] 11/27/2022 10:15 A M Medical Record Number: [id number] Patient Account Number: [id number] Date of Birth/Sex: Treating RN: 04-04-1941 (82 y.o. F) Primary Care Scout Guyett: [name] Other Clinician: Referring Cindee Mclester: Treating Marilyn Wing/Extender: [name], [name] in Treatment: 27 Wound Status Wound Number: 5 Primary Venous Leg Ulcer Etiology: Wound Location: Right, Lateral Lower Leg Wound Open Wounding Event: Trauma Status: Date Acquired: 04/17/2022 Comorbid Sleep Apnea, Hypertension, Peripheral Venous Disease, Weeks Of Treatment: 27 History: Osteoarthritis, Neuropathy Clustered Wound: Yes Photos Wound Measurements Length: (cm) 12 Width: (cm) 7.5 GLEDA, CANTONE ([id number]) Depth: (cm) 0.2  Clustered Quantity: 3 Area: (cm) 70.686 Volume: (cm) 14.137 % Reduction in Area: -170.1% % Reduction in Volume: -80.1% [id number] Page 7 of  8 Epithelialization: Small (1-33%) Tunneling: No Undermining: No Wound Description Classification: Full Thickness Without Exposed Supp Wound Margin: Distinct, outline attached Exudate Amount: Medium Exudate Type: Serosanguineous Exudate Color: red, brown ort Structures Foul Odor After Cleansing: No Slough/Fibrino Yes Wound Bed Granulation Amount: Medium (34-66%) Exposed Structure Granulation Quality: Red Fascia Exposed: No Necrotic Amount: Medium (34-66%) Fat Layer (Subcutaneous Tissue) Exposed: Yes Necrotic Quality: Eschar, Adherent Slough Tendon Exposed: No Muscle Exposed: No Joint Exposed: No Bone Exposed: No Periwound Skin Texture Texture Color No Abnormalities Noted: No No Abnormalities Noted: No Callus: No Atrophie Blanche: No Crepitus: No Cyanosis: No Excoriation: No Ecchymosis: No Induration: No Erythema: No Rash: No Hemosiderin Staining: Yes Scarring: Yes Mottled: No Pallor: No Moisture Rubor: No No Abnormalities Noted: No Dry / Scaly: Yes Temperature / Pain Maceration: No Temperature: No Abnormality Tenderness on Palpation: Yes Treatment Notes Wound #5 (Lower Leg) Wound Laterality: Right, Lateral Cleanser Soap and Water Discharge Instruction: May shower and wash wound with dial antibacterial soap and water prior to dressing change. Peri-Wound Care Zinc Oxide Ointment 30g tube Discharge Instruction: Apply Zinc Oxide to periwound with each dressing change TO ANY MACERATED SKIN. Sween Lotion (Moisturizing lotion) Discharge Instruction: Apply moisturizing lotion as directed Topical compounding topical antibiotics Discharge Instruction: apply Keystone directly to wound bed under the hydrofera blue. Home Health start. MIX ACCORDING TO THE PHARMACY INSTRUCTIONS. Primary Dressing PolyMem Non-Adhesive Dressing, 4x4 in Discharge Instruction: Apply to wound bed as instructed Secondary Dressing ABD Pad, 8x10 Discharge Instruction: Apply over primary  dressing as directed. Woven Gauze Sponge, Non-Sterile 4x4 in Discharge Instruction: Apply over primary dressing as directed. Zetuvit Plus 4x8 in Discharge Instruction: Or double ABD pad Apply over primary dressing as directed. Secured With Compression Wrap Kerlix Roll 4.5x3.1 (in/yd) Discharge Instruction: Apply Kerlix and Coban compression as directed. [address] 4x5 (in/yd) JAMILLE, SAVOIA ([id number]) [id number] Page 8 of 8 Discharge Instruction: Apply over Kerlix as directed. unna boot FIRST LAYER **** SEE INSTRUCTIONS Discharge Instruction: APPLY FIRST LAYER UNNA BOOT AT BASES OF TOES AND JUST BELOW THE KNEE TO HOLD COMPRESSION WRAP IN PLACE. Compression Stockings Add-Ons Electronic Signature(s) Signed: 11/27/2022 12:42:57 PM By: Karie Schwalbe RN Entered By: Karie Schwalbe on 11/27/2022 07:59:44 -------------------------------------------------------------------------------- Vitals Details Patient Name: Date of Service: Jamie Brink, MA [name] 11/27/2022 10:15 A M Medical Record Number: [id number] Patient Account Number: [id number] Date of Birth/Sex: Treating RN: 1941-01-06 (82 y.o. F) Primary Care Camry Robello: [name] Other Clinician: Referring Dominico Rod: Treating Deona Novitski/Extender: [name], [name] in Treatment: 27 Vital Signs Time Taken: 10:42 Temperature (F): 98.6 Height (in): 62 Pulse (bpm): 50 Weight (lbs): 171 Respiratory Rate (breaths/min): 16 Body Mass Index (BMI): 31.3 Blood Pressure (mmHg): 104/64 Reference Range: 80 - 120 mg / dl Electronic Signature(s) Signed: 11/28/2022 5:02:38 PM By: Thayer Dallas Entered By: Thayer Dallas on 11/27/2022 07:42:59

## 2022-12-04 ENCOUNTER — Encounter (HOSPITAL_BASED_OUTPATIENT_CLINIC_OR_DEPARTMENT_OTHER): Payer: Medicare HMO | Admitting: Internal Medicine

## 2022-12-04 DIAGNOSIS — I87331 Chronic venous hypertension (idiopathic) with ulcer and inflammation of right lower extremity: Secondary | ICD-10-CM | POA: Diagnosis not present

## 2022-12-05 NOTE — Progress Notes (Signed)
Jamie [name], Jamie [name] ([id number]) [id number] Page 1 of 10 Visit Report for 12/04/2022 Debridement Details Patient Name: Date of Service: Jamie [name], [name]. 12/04/2022 10:15 A M Medical Record Number: [id number] Patient Account Number: [id number] Date of Birth/Sex: Treating RN: Aug 03, 1940 (82 y.o. F) Primary Care Provider: [name] Other Clinician: Referring Provider: Treating Provider/Extender: Vivia Budge in Treatment: 28 Debridement Performed for Assessment: Wound #5 Right,Lateral Lower Leg Performed By: Physician Maxwell Caul., MD Debridement Type: Debridement Severity of Tissue Pre Debridement: Fat layer exposed Level of Consciousness (Pre-procedure): Awake and Alert Pre-procedure Verification/Time Out Yes - 11:10 Taken: Start Time: 11:11 Pain Control: Lidocaine 4% T opical Solution Percent of Wound Bed Debrided: 70% T Area Debrided (cm): otal 41.21 Tissue and other material debrided: Viable, Non-Viable, Slough, Slough Level: Non-Viable Tissue Debridement Description: Selective/Open Wound Instrument: Curette Bleeding: Minimum Hemostasis Achieved: Pressure End Time: 11:15 Procedural Pain: 0 Post Procedural Pain: 0 Response to Treatment: Procedure was tolerated well Level of Consciousness (Post- Awake and Alert procedure): Post Debridement Measurements of Total Wound Length: (cm) 10 Width: (cm) 7.5 Depth: (cm) 0.2 Volume: (cm) 11.781 Character of Wound/Ulcer Post Debridement: Improved Severity of Tissue Post Debridement: Fat layer exposed Post Procedure Diagnosis Same as Pre-procedure Electronic Signature(s) Signed: 12/04/2022 5:08:25 PM By: [name] MD Entered By: [name] on 12/04/2022 11:30:09 -------------------------------------------------------------------------------- HPI Details Patient Name: Date of Service: Jamie Brink, [name] 12/04/2022 10:15 A  M Medical Record Number: [id number] Patient Account Number: [id number] Date of Birth/Sex: Treating RN: December 18, 1940 (82 y.o. F) Primary Care Provider: [name] Other Clinician: Referring Provider: Treating Provider/Extender: Vivia Budge in Treatment: [address], [place] W ([id number]) [id number] Page 2 of 10 History of Present Illness HPI Description: ADMISSION 06/30/2020 Jamie [name] is an 82 year old woman who lives in [place]. She is here with her niece for review of wounds on the left medial lower leg and ankle. These have apparently been present for over a year and she followed with Dr. Olegario Messier at the [facility] in [place] for quite a period of time although it looks as though there was a initial consult wound from Dr. Marcha Solders on February 18 presumably there was therefore hiatus. At that point the wounds were described as being there for 3 months. She also tells me she was at the wound care center in [place] for a period of time with this. 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 She had venous studies that were negative for DVT ABIs on the right were 1.01 on the left 1.06. She has . had previous applications of puraply, compression which she does not tolerate very well. She has had several rounds of oral antibiotic therapy. She complains of unrelenting pain and she is seeing Dr. Reece Agar V of pain management apparently was on oxycodone but that did not help. She also had a skin biopsy done by Dr. Olegario Messier although we do not have that result. She does not appear to have an arterial issue. I am not completely clear what she has been putting on the wounds lately. 3/31; this patient has a particularly nasty set of wounds on the left medial ankle in the middle of what looks to be hemosiderin deposition secondary to chronic venous insufficiency. She has a lot of  pain followed by pain management. Dr. Marcha Solders apparently did a biopsy of something on the left leg last fall what I would like to get this result. She has  a history of MRSA treatment. I do not believe she had reflux studies but she did have DVT rule out studies. Previous ABIs have not suggested arterial insufficiency 4/7; difficult wounds on the left medial ankle probably chronic venous insufficiency. With considerable effort on behalf of our case manager we were able to finally to speak to somebody at the hospital in [place] who indicated that no biopsy of this area have been done even though the patient describes this in some detail and is even able to point out where she thinks the biopsy was done. We have been using Sorbact. The PCR culture I did showed polymicrobial identification with Pseudomonas, staph aureus, Peptostreptococcus. All of this and low titers. Resistance genes identified were MRSA, staph virulence gene and tetracycline. We are going to send this to [facility] for a topical antibiotic which is something that we have had good success with recently in large venous ulcers with a lot of purulent drainage. There would not be an easy oral alternative here possibly line escalated and ciprofloxacin if we need to use systemic antibiotic 4/15; difficult area on the left medial ankle. Most likely chronic venous insufficiency. I think she will probably need venous reflux study I think she had DVT rule outs but not venous reflux studies. We have not yet obtained the topical antibiotics. She has home health changing the dressing we have been using Sorbact for adherent fibrinous debris on the surface. Very difficult to remove 4/22; patient presents for 1 week follow-up. She has been using sore back under compression wraps and these are changed 3 times a week with home health. She also had Keystone antibiotics sent to her house and brought them in today. She has no complaints or issues today. 5/2;  patient is here for follow-up. She has been using Sorbact under compression. Very painful wound. She has been using Keystone antibiotics. Not much improvement although the more medial part of the wound has cleaned up nicely and the larger part of the wound about 50% slough covered. Part of the issue here is that she had stays at both [facility] wound care center, [facility] wound care center and now [place]. Not sure if she has had venous reflux studies. As far as we are able to tell she did not have a biopsy. My notes state that she did not have venous reflux studies just DVT rule outs. 5/16; patient goes for venous reflux studies this afternoon. She says that wound was biopsied which sounds like punch biopsies by Dr. Lynden Ang we do not have these results. We are using Sorbact. Very difficult wound to debride 5/23; patient presents for 1 week follow-up. She reports tolerating the wraps well with sorbact underneath. She had ABIs and venous reflux studies done. She has no issues or complaints today. She denies acute signs of infection. 6/6; I have reviewed the patient's vascular studies. Wounds are on the left medial and posterior calf. She had significant reflux in the greater saphenous vein in the in the mid thigh, distal thigh knee and the small saphenous vein in the popliteal fossa. The vein diameters do not look too impressive though. She is going to see the vascular surgeon on Wednesday. She also had venous reflux in the right common femoral vein. She did not have any evidence of a DVT or SVT I am wondering whether there is an ablation procedure that would benefit her in the greater saphenous vein on the left. . She tells [place] that home health put the dressing on  too tight and she took off 1 layer. The swelling in her left leg is a little worse as a result of this. Not much change in the wound measurements so the surface of the wound looks better Her arterial studies showed an ABI on the right of 1.14 with a  triphasic waveform and a great toe pressure of 0.89. On the left her ABIs were noncompressible at 1.34 but with triphasic waveforms and a TBI of 0.97. Her greater toe pressure was 122. There was no evidence of significant bilateral arterial disease 6/20 patient went to see Dr. Durwin Nora. He did not think she had significant arterial disease. In terms of her venous duplex on the right side there was no evidence of a DVT or SVT there was deep venous reflux involving the common femoral vein no superficial vein reflux on the left side there was no DVT or SVT there was no deep vein graft reflux there was some reflux in the greater saphenous vein from the mid thigh to the knee but the vein here was not dilated. He thought these were venous wounds he prescribed a compression pump but I am not sure who we ordered it from The patient has been approved for Apligraf. Still using silver collagen this week 7/5; Apligraf #1 7/19 Apligraf #2. Decent improvement in the condition of the wound bed. Epithelialization distal 8/2 Apligraf #3. No issues or complaints. Denies signs of infection. 8/17 Apligraf #4. No issues or concerns. Some complaints of pruritus and the rash. Our intake nurse brought up the fact that she had previously indicated possible cotton layer sensitivity we will therefore use kerlix in the bottom layer of the compression 8/30; the patient comes in with the area on her left medial leg just about healed. There is a superficial area more towards the tibia and a smaller open area distally everything else is epithelialized. I do not think she requires another Apligraf 9/13; left medial leg is healed. 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 Her edema control is good Readmisstion: 05-22-2022 upon evaluation today patient presents for initial inspection here in our clinic concerning issues that she has been having with a wound of the right lateral  lower extremity. This is an area of a previous skin graft she tells me. With that being said she unfortunately had a scrape on this that occurred around 10 January. Since that time she has noted that this has just continued to get bigger and bigger in her niece who is present with her today actually states that 2 weeks ago when she saw that it was significantly smaller than what she sees currently. Obviously this is of utmost concern as we do not want this to continue to get larger when arrested and get moving in the right direction. Fortunately there does not appear to be any signs of systemic infection though locally I think we probably do have some infection present. I would obtain a culture to see what we have going on here and then we will subsequently see where things go going forward. Fortunately I think that she is in the right place at this point being at the wound center we can definitely do something to try to get this moving in the right direction. Patient does have a history of chronic venous insufficiency as well as coronary artery disease. In the past she has done well with compression wraps on the start with a 3 layer wrap I  think she is probably can end up going to a 4-layer at some point but we will see how things do over the next week. She has been using wound cleanser along with she tells me a zinc and topical but again I am not sure exactly what that was. 05-29-2022 upon evaluation today patient appears to be doing well currently in regard to her wound. This actually is showing signs of improvement I am happy in that regard unfortunately her left leg has an area on the shin that has opened. This is the region where one of the areas at least we have previously taken care of. Nonetheless this is small hoping we get it under control before things worsen significantly. 06-05-2022 upon evaluation today patient appears to be doing well currently in regard to her wounds. The actually seem to  be fairly clean she is having still quite a bit of problem with pain at this point she would like to not have debridement today for all possible. With that being said I do believe that we are making some Jamie [name], Jamie [name] ([id number]) [id number] Page 3 of 10 good progress I think the [facility] is doing a decent job here. 3/6; patient has had 3/6; patient with known severe chronic venous insufficiency. She apparently had a traumatic wound at home and has a reasonably substantial wound on the right lateral lower leg and a smaller one on the left anterior lower leg as well. We have been using Hydrofera Blue and Unna boots 3/13; patient presents for follow-up. We have been using Hydrofera Blue under Coflex to the legs bilaterally. She has no issues or complaints today. 06-26-2022 upon evaluation today patient appears to be doing well currently in regard to her wound. This is measuring a little bit larger however. Fortunately I do not see any signs of infection this is on the right side. Fortunately the left side is actually completely healed which is great news. 07-03-2022 patient's wound unfortunately is continuing to show signs of being worse. Her compression which was the Tubigrip that I thought should be able to pull up actually slipped down and she was not able to pull that back up. With that being said that means that unfortunately her wound has continued to deteriorate since I last saw her. This is definitely not the direction that we are looking for. I discussed with her that I do believe we need to go ahead and see about getting her started on antibiotics and I subsequently would also like to go ahead and see about getting things moving forward with regard to a wound culture and making a change up her dressings as well but this can be changed more frequently than just once a week. 07-10-2022 upon evaluation today patient actually appears to be doing much better. I  do believe that the antibiotics have been beneficial for her. Fortunately I do not see any signs of active infection locally nor systemically which is great news. No fevers, chills, nausea, vomiting, or diarrhea. 07-17-2022 upon evaluation today patient appears to be doing well currently in regard to her wound which is actually showing signs of improvement this is slow but nonetheless prevalent that we are seeing good improvement here. Fortunately I do not see any signs of active infection locally nor systemically which is great news. 07-24-2022 upon evaluation today patient appears to be doing well currently in regard to her wound although the wrap that we had on has been slipping down and she really does  not have anybody to help her with this at home health is not going to be coming out we could not get anybody that would actually be able to do the dressing changes. Fortunately I do not see any signs of active infection locally nor systemically which is great news. 08-07-22 poorly in regard to her leg compared to what it was previous. Fortunately there does not appear to be any signs of active infection locally nor systemically which is great news. No fevers, chills, nausea, vomiting, or diarrhea. With that being said it does appear that the patient may have some cellulitis in the leg which is not good. 08-14-2022 upon evaluation today patient appears to be doing somewhat better in regard to her wounds she still is having quite a bit of pain we are still not where we want to be as far as healing is concerned completely. Fortunately I do not see any evidence of active infection locally nor systemically which is great news and I am very pleased in that regard. 08-21-23 upon evaluation today patient appears to be doing poorly currently in regard to her wound. She has been tolerating the dressing changes without complication. Fortunately there does not appear to be any signs of active infection locally nor  systemically at this time. 08-28-2022 upon evaluation today patient appears to be doing poorly in regard to her wound this was not wrapped appropriately home health did not go up high enough on the wrap. This has caused some issues and I discussed that with the patient today. I do believe that she is going to require aggressive wrapping and treatment which she is getting the [facility] topical antibiotics today they can start using that at the next wrap on Friday. In the meantime they need to make sure that the rapid and appropriately we wrote very specific orders today. 09-04-2022 upon evaluation today patient appears to be doing well currently in regard to her wound which I think is making progress is still hurting her quite significantly. Fortunately I do not see any signs of active infection locally or systemically which is great news. No fevers, chills, nausea, vomiting, or diarrhea. 09-11-2022 upon evaluation today patient's wound actually is showing signs of being a little bit smaller and looking a little bit better she still has a lot going on here however. Fortunately I do not see any evidence of active infection Worsening locally nor systemically which is great news. With that being said worsening still continue to use the topical Keystone antibiotics. 09-18-2022 upon evaluation today patient actually showing some signs of improvement. I am actually very pleased with where we stand compared to where we have been. I think that she is making good headway here. She is still having quite a bit of pain but it seems to be lessening compared to previous. 09-25-2022 upon evaluation patient's wound actually showing signs slowly but surely of improvement. Fortunately I do not see any signs of active infection at this time systemically and locally I feel like this is dramatically improved. She is doing well with the [facility] topical antibiotics. 10-02-2022 upon evaluation today patient appears to be doing  better in regard to her wound overall. I am very pleased with where things stand from a visual standpoint I do not see any signs of active infection and in general I think that we are moving in the right direction here. 10-09-2022 upon evaluation today patient appears to be doing well currently in regard to her wound. She has been tolerating  the dressing changes without complication. Fortunately I do not see any signs of active infection at this time which is great news. 10-16-2022 upon evaluation today patient continues to have quite a bit of pain in regard to her right leg. We have been attempting to debride little by little as we could but again was pretty limited by the fact that she is having significant discomfort. We do not actually have any signs of active infection going on at this point that the [facility] topical antibiotics have been helping quite readily. I been attempting to do as much debridement as I can but if she were to have pain medication this would actually help and in the past when she did it was much more tolerable for her as far as taking the edge off. Right now she has been without as she is in transition from her previous pain management physician to someone new to manage this. 10-23-2022 upon evaluation patient appears to be doing excellent in regard to her wound. She has been tolerating the dressing changes without complication. Fortunately I do not see any evidence of active infection locally nor systemically which is great news. No fevers, chills, nausea, vomiting, or diarrhea. 7/24; patient with a wound secondary to chronic venous insufficiency on the right lateral lower leg. We have been using Keystone antibiotic Hydrofera Blue under kerlix Coban. She complains of a lot of pain after last week's debridement otherwise things appear to be improved per discussion with our intake nurse. 11-06-2022 upon evaluation today patient appears to be doing excellent at this point in regard  to her wound. She has been tolerating the dressing changes without complication and to be honest she is making excellent progress towards complete closure. I am actually very pleased with where we stand I think that she is doing excellent as far as the overall appearance of the wound is concerned as well. She is going require some debridement however. 11-20-2022 upon evaluation today patient appears to be doing well currently in regard to her wound. She has been tolerating the dressing changes without complication. Fortunately there does not appear to be any signs of active infection locally or systemically which is great news and in general I do believe that we are moving in the right direction here. No fevers, chills, nausea, vomiting, or diarrhea. 11-27-2022 upon evaluation today patient appears to be doing a little better in regards to the overall appearance of her wound but unfortunately she is having increased pain. The collagen seems to be getting very dry and stuck to the wound bed which is causing her some issues here. Fortunately I do not see any signs of active infection locally nor systemically at this time. Fortunately I think that her wound is better but unfortunately her pain has not. For that reason I am going to avoid any debridement today since the patient is very upset about the pain and how bad this is hurting at this point. I think we can have to try something a little bit different I am thinking PolyMem may be a good option. 8/28; this patient has difficult venous wounds on the right lateral lower leg and ankle in the setting of chronic venous insufficiency. We have been using polymen under compression with kerlix Coban. ANALICE, HOLTZ ([id number]) [id number] Page 4 of 10 Electronic Signature(s) Signed: 12/04/2022 5:08:25 PM By: [name] MD Entered By: [name] on 12/04/2022  11:31:07 -------------------------------------------------------------------------------- Physical Exam Details Patient Name: Date of Service: Jamie Brink, [name]  12/04/2022 10:15 A M Medical Record Number: [id number] Patient Account Number: [id number] Date of Birth/Sex: Treating RN: 1940/05/03 (82 y.o. F) Primary Care Provider: [name] Other Clinician: Referring Provider: Treating Provider/Extender: Vivia Budge in Treatment: 28 Constitutional Sitting or standing Blood Pressure is within target range for patient.. Pulse regular and within target range for patient.Marland Kitchen Respirations regular, non-labored and within target range.. Temperature is normal and within the target range for the patient.Marland Kitchen Appears in no distress. Notes Wound exam; this patient has a improved wound on the right lateral lower leg. However under illumination there is very adherent surface material. I tried to use a scoop curette to remove some of this although it is hard because of pain issues. Fortunately no evidence of surrounding infection. Her edema is well- controlled with kerlix Coban. Electronic Signature(s) Signed: 12/04/2022 5:08:25 PM By: [name] MD Entered By: [name] on 12/04/2022 11:32:08 -------------------------------------------------------------------------------- Physician Orders Details Patient Name: Date of Service: Jamie [name], Christean Grief. 12/04/2022 10:15 A M Medical Record Number: [id number] Patient Account Number: [id number] Date of Birth/Sex: Treating RN: 1940/11/10 (82 y.o. 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 Leonard Schwartz Wednesday Room 8 or 9 weekly 12/11/22 at 10:15am Return A ppointment in 2 weeks. Leonard Schwartz Wednesday Room 8 12/18/22 at 10:15am Return A Return appointment in 3 weeks. Leonard Schwartz Wednesday 12/25/2022 1015 Other: - home health to wash right leg and foot with soap (dial soap) and water with dressing changes. Patient to have soap and towels next to her when Home Health arrives at the house. *****[facility] compounding topical antibiotics. FOLLOW THE PHARMACY INSTRUCTIONS ON HOW TO MIX, and apply under the Collagen.****** ****PLEASE APPLY ZINC OXIDE as needed AROUND THE PERIWOUND DUE TO MACERATION. (when macerated) ****Patient to bring in the [facility] topical antibiotics to each wound care appt as well.***** *****HOME HEALTH TO PLEASE WRAP FROM BASE OF TOES TO JUST BELOW KNEE. please appy first layer of unna boot at the bast of the toes and just below the knee to help hold the compression wrap in place.**** [name] ([id number]) [id number] Page 5 of 10 Anesthetic Wound #5 Right,Lateral Lower Leg (In clinic) Topical Lidocaine 4% applied to wound bed Bathing/ Shower/ Hygiene May shower and wash wound with soap and water. - with dressing changes. Edema Control - Lymphedema / SCD / Other Right Lower Extremity Elevate legs to the level of the heart or above for 30 minutes daily and/or when sitting for 3-4 times a day throughout the day. Avoid standing for long periods of time. Exercise regularly Home Health Wound #5 Right,Lateral Lower Leg No change in wound care orders this week; continue Home Health for wound care. May utilize formulary equivalent dressing for wound treatment orders unless otherwise specified. -  Primary dressing is POLYMEM max Other Home Health Orders/Instructions: - [facility] [place], [place] fax # [phone]. Wound Treatment Wound #5 - Lower Leg Wound Laterality: Right, Lateral Cleanser: Soap and Water 3 x Per Week/30 Days Discharge Instructions: May shower and wash wound with dial antibacterial soap and water  prior to dressing change. Peri-Wound Care: Zinc Oxide Ointment 30g tube 3 x Per Week/30 Days Discharge Instructions: Apply Zinc Oxide to periwound with each dressing change TO ANY MACERATED SKIN. Peri-Wound Care: Sween Lotion (Moisturizing lotion) 3 x Per Week/30 Days Discharge Instructions: Apply moisturizing lotion as directed Topical: compounding topical antibiotics 3 x Per Week/30 Days Discharge Instructions: apply Keystone directly to wound bed under the hydrofera blue. Home Health start. MIX ACCORDING TO THE PHARMACY INSTRUCTIONS. Prim Dressing: PolyMem Non-Adhesive Dressing, 4x4 in 3 x Per Week/30 Days ary Discharge Instructions: Apply to wound bed as instructed Secondary Dressing: ABD Pad, 8x10 3 x Per Week/30 Days Discharge Instructions: Apply over primary dressing as directed. Secondary Dressing: Woven Gauze Sponge, Non-Sterile 4x4 in 3 x Per Week/30 Days Discharge Instructions: Apply over primary dressing as directed. Secondary Dressing: Zetuvit Plus 4x8 in 3 x Per Week/30 Days Discharge Instructions: Or double ABD pad Apply over primary dressing as directed. Compression Wrap: Kerlix Roll 4.5x3.1 (in/yd) 3 x Per Week/30 Days Discharge Instructions: Apply Kerlix and Coban compression as directed. Compression Wrap: Coban Self-Adherent Wrap 4x5 (in/yd) 3 x Per Week/30 Days Discharge Instructions: Apply over Kerlix as directed. Compression Wrap: unna boot FIRST LAYER **** SEE INSTRUCTIONS 3 x Per Week/30 Days Discharge Instructions: APPLY FIRST LAYER UNNA BOOT AT BASES OF TOES AND JUST BELOW THE KNEE TO HOLD COMPRESSION WRAP IN  PLACE. Electronic Signature(s) Signed: 12/04/2022 5:08:25 PM By: [name] MD Signed: 12/04/2022 6:19:00 PM By: Shawn Stall RN, BSN Entered By: Shawn Stall on 12/04/2022 11:17:02 -------------------------------------------------------------------------------- Problem List Details Patient Name: Date of Service: Jamie [name], [name] 12/04/2022 10:15 A M Medical Record Number: [id number] Patient Account Number: [id number] Date of Birth/Sex: Treating RN: 03/04/1941 (82 y.o. 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 12/04/2022 10:15 A M Medical Record Number: [id number] Patient Account Number: [id number] Date of Birth/Sex: Treating RN: [birth year] (81 y.o. F) Primary Care Provider: [name] Other Clinician: Referring Provider: Treating Provider/Extender: Vivia Budge in Treatment: 28 Subjective History of Present Illness (HPI) ADMISSION 06/30/2020 Mrs. Lebeck is an  82 year old woman who lives in [place]. She is here with her niece for review of wounds on the left medial lower leg and ankle. These have apparently been present for over a year and she followed with Dr. Olegario Messier at the [facility] in [place] for quite a period of time although it looks as though there was a initial consult wound from Dr. Marcha Solders on February 18 presumably there was therefore hiatus. At that point the wounds were described as being there for 3 months. She also tells me she was at the wound care center in [place] for a period of time with this. 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 She had venous studies  that were negative for DVT ABIs on the right were 1.01 on the left 1.06. She has . had previous applications of puraply, compression which she does not tolerate very well. She has had several rounds of oral antibiotic therapy. She complains of unrelenting pain and she is seeing Dr. Reece Agar V of pain management apparently was on oxycodone but that did not help. She also had a skin biopsy done by Dr. Olegario Messier although we do not have that result. She does not appear to have an arterial issue. I am not completely clear what she has been putting on the wounds lately. 3/31; this patient has a particularly nasty set of wounds on the left medial ankle in the middle of what looks to be hemosiderin deposition secondary to chronic venous insufficiency. She has a lot of pain followed by pain management. Dr. Marcha Solders apparently did a biopsy of something on the left leg last fall what I would like to get this result. She has a history of MRSA treatment. I do not believe she had reflux studies but she did have DVT rule out studies. Previous ABIs have not suggested arterial insufficiency 4/7; difficult wounds on the left medial ankle probably chronic venous insufficiency. With considerable effort on behalf of our case manager we were able to finally  to speak to somebody at the hospital in [place] who indicated that no biopsy of this area have been done even though the patient describes this in some detail and is even able to point out where she thinks the biopsy was done. We have been using Sorbact. The PCR culture I did showed polymicrobial identification with Pseudomonas, staph aureus, Peptostreptococcus. All of this and low titers. Resistance genes identified were MRSA, staph virulence gene and tetracycline. We are going to send this to [facility] for a topical antibiotic which is something that we have had good success with recently in large venous ulcers with a lot of purulent drainage. There would not be an easy oral alternative here possibly line escalated and ciprofloxacin if we need to use systemic antibiotic 4/15; difficult area on the left medial ankle. Most likely chronic venous insufficiency. I think she will probably need venous reflux study I think she had DVT Jamie [name], Jamie [name] ([id number]) [id number] Page 7 of 10 rule outs but not venous reflux studies. We have not yet obtained the topical antibiotics. She has home health changing the dressing we have been using Sorbact for adherent fibrinous debris on the surface. Very difficult to remove 4/22; patient presents for 1 week follow-up. She has been using sore back under compression wraps and these are changed 3 times a week with home health. She also had Keystone antibiotics sent to her house and brought them in today. She has no complaints or issues today. 5/2; patient is here for follow-up. She has been using Sorbact under compression. Very painful wound. She has been using Keystone antibiotics. Not much improvement although the more medial part of the wound has cleaned up nicely and the larger part of the wound about 50% slough covered. Part of the issue here is that she had stays at both [facility] wound care center, [facility] wound care center and now [place].  Not sure if she has had venous reflux studies. As far as we are able to tell she did not have a biopsy. My notes state that she did not have venous reflux studies just DVT rule outs. 5/16; patient goes for venous reflux studies this afternoon. She says that wound was  biopsied which sounds like punch biopsies by Dr. Lynden Ang we do not have these results. We are using Sorbact. Very difficult wound to debride 5/23; patient presents for 1 week follow-up. She reports tolerating the wraps well with sorbact underneath. She had ABIs and venous reflux studies done. She has no issues or complaints today. She denies acute signs of infection. 6/6; I have reviewed the patient's vascular studies. Wounds are on the left medial and posterior calf. She had significant reflux in the greater saphenous vein in the in the mid thigh, distal thigh knee and the small saphenous vein in the popliteal fossa. The vein diameters do not look too impressive though. She is going to see the vascular surgeon on Wednesday. She also had venous reflux in the right common femoral vein. She did not have any evidence of a DVT or SVT I am wondering whether there is an ablation procedure that would benefit her in the greater saphenous vein on the left. . She tells [place] that home health put the dressing on too tight and she took off 1 layer. The swelling in her left leg is a little worse as a result of this. Not much change in the wound measurements so the surface of the wound looks better Her arterial studies showed an ABI on the right of 1.14 with a triphasic waveform and a great toe pressure of 0.89. On the left her ABIs were noncompressible at 1.34 but with triphasic waveforms and a TBI of 0.97. Her greater toe pressure was 122. There was no evidence of significant bilateral arterial disease 6/20 patient went to see Dr. Durwin Nora. He did not think she had significant arterial disease. In terms of her venous duplex on the right side there was no  evidence of a DVT or SVT there was deep venous reflux involving the common femoral vein no superficial vein reflux on the left side there was no DVT or SVT there was no deep vein graft reflux there was some reflux in the greater saphenous vein from the mid thigh to the knee but the vein here was not dilated. He thought these were venous wounds he prescribed a compression pump but I am not sure who we ordered it from The patient has been approved for Apligraf. Still using silver collagen this week 7/5; Apligraf #1 7/19 Apligraf #2. Decent improvement in the condition of the wound bed. Epithelialization distal 8/2 Apligraf #3. No issues or complaints. Denies signs of infection. 8/17 Apligraf #4. No issues or concerns. Some complaints of pruritus and the rash. Our intake nurse brought up the fact that she had previously indicated possible cotton layer sensitivity we will therefore use kerlix in the bottom layer of the compression 8/30; the patient comes in with the area on her left medial leg just about healed. There is a superficial area more towards the tibia and a smaller open area distally everything else is epithelialized. I do not think she requires another Apligraf 9/13; left medial leg is healed. 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 Her edema control is good Readmisstion: 05-22-2022 upon evaluation today patient presents for initial inspection here in our clinic concerning issues that she has been having with a wound of the right lateral lower extremity. This is an area of a previous skin graft she tells me. With that being said she unfortunately had a scrape on this that occurred around 10 January. Since that time she has noted that  this has just continued to get bigger and bigger in her niece who is present with her today actually states that 2 weeks ago when she saw that it was significantly smaller than what she sees  currently. Obviously this is of utmost concern as we do not want this to continue to get larger when arrested and get moving in the right direction. Fortunately there does not appear to be any signs of systemic infection though locally I think we probably do have some infection present. I would obtain a culture to see what we have going on here and then we will subsequently see where things go going forward. Fortunately I think that she is in the right place at this point being at the wound center we can definitely do something to try to get this moving in the right direction. Patient does have a history of chronic venous insufficiency as well as coronary artery disease. In the past she has done well with compression wraps on the start with a 3 layer wrap I think she is probably can end up going to a 4-layer at some point but we will see how things do over the next week. She has been using wound cleanser along with she tells me a zinc and topical but again I am not sure exactly what that was. 05-29-2022 upon evaluation today patient appears to be doing well currently in regard to her wound. This actually is showing signs of improvement I am happy in that regard unfortunately her left leg has an area on the shin that has opened. This is the region where one of the areas at least we have previously taken care of. Nonetheless this is small hoping we get it under control before things worsen significantly. 06-05-2022 upon evaluation today patient appears to be doing well currently in regard to her wounds. The actually seem to be fairly clean she is having still quite a bit of problem with pain at this point she would like to not have debridement today for all possible. With that being said I do believe that we are making some good progress I think the [facility] is doing a decent job here. 3/6; patient has had 3/6; patient with known severe chronic venous insufficiency. She apparently had a traumatic  wound at home and has a reasonably substantial wound on the right lateral lower leg and a smaller one on the left anterior lower leg as well. We have been using Hydrofera Blue and Unna boots 3/13; patient presents for follow-up. We have been using Hydrofera Blue under Coflex to the legs bilaterally. She has no issues or complaints today. 06-26-2022 upon evaluation today patient appears to be doing well currently in regard to her wound. This is measuring a little bit larger however. Fortunately I do not see any signs of infection this is on the right side. Fortunately the left side is actually completely healed which is great news. 07-03-2022 patient's wound unfortunately is continuing to show signs of being worse. Her compression which was the Tubigrip that I thought should be able to pull up actually slipped down and she was not able to pull that back up. With that being said that means that unfortunately her wound has continued to deteriorate since I last saw her. This is definitely not the direction that we are looking for. I discussed with her that I do believe we need to go ahead and see about getting her started on antibiotics and I subsequently would also like  to go ahead and see about getting things moving forward with regard to a wound culture and making a change up her dressings as well but this can be changed more frequently than just once a week. 07-10-2022 upon evaluation today patient actually appears to be doing much better. I do believe that the antibiotics have been beneficial for her. Fortunately I do not see any signs of active infection locally nor systemically which is great news. No fevers, chills, nausea, vomiting, or diarrhea. 07-17-2022 upon evaluation today patient appears to be doing well currently in regard to her wound which is actually showing signs of improvement this is slow but nonetheless prevalent that we are seeing good improvement here. Fortunately I do not see any signs  of active infection locally nor systemically which is great news. 07-24-2022 upon evaluation today patient appears to be doing well currently in regard to her wound although the wrap that we had on has been slipping down and she really does not have anybody to help her with this at home health is not going to be coming out we could not get anybody that would actually be able to do the dressing changes. Fortunately I do not see any signs of active infection locally nor systemically which is great news. 08-07-22 poorly in regard to her leg compared to what it was previous. Fortunately there does not appear to be any signs of active infection locally nor systemically which is great news. No fevers, chills, nausea, vomiting, or diarrhea. With that being said it does appear that the patient may have some cellulitis in the leg which is not good. Jamie [name], Jamie [name] ([id number]) [id number] Page 8 of 10 08-14-2022 upon evaluation today patient appears to be doing somewhat better in regard to her wounds she still is having quite a bit of pain we are still not where we want to be as far as healing is concerned completely. Fortunately I do not see any evidence of active infection locally nor systemically which is great news and I am very pleased in that regard. 08-21-23 upon evaluation today patient appears to be doing poorly currently in regard to her wound. She has been tolerating the dressing changes without complication. Fortunately there does not appear to be any signs of active infection locally nor systemically at this time. 08-28-2022 upon evaluation today patient appears to be doing poorly in regard to her wound this was not wrapped appropriately home health did not go up high enough on the wrap. This has caused some issues and I discussed that with the patient today. I do believe that she is going to require aggressive wrapping and treatment which she is getting the [facility]  topical antibiotics today they can start using that at the next wrap on Friday. In the meantime they need to make sure that the rapid and appropriately we wrote very specific orders today. 09-04-2022 upon evaluation today patient appears to be doing well currently in regard to her wound which I think is making progress is still hurting her quite significantly. Fortunately I do not see any signs of active infection locally or systemically which is great news. No fevers, chills, nausea, vomiting, or diarrhea. 09-11-2022 upon evaluation today patient's wound actually is showing signs of being a little bit smaller and looking a little bit better she still has a lot going on here however. Fortunately I do not see any evidence of active infection Worsening locally nor systemically which is great news. With that being said worsening  still continue to use the topical Keystone antibiotics. 09-18-2022 upon evaluation today patient actually showing some signs of improvement. I am actually very pleased with where we stand compared to where we have been. I think that she is making good headway here. She is still having quite a bit of pain but it seems to be lessening compared to previous. 09-25-2022 upon evaluation patient's wound actually showing signs slowly but surely of improvement. Fortunately I do not see any signs of active infection at this time systemically and locally I feel like this is dramatically improved. She is doing well with the [facility] topical antibiotics. 10-02-2022 upon evaluation today patient appears to be doing better in regard to her wound overall. I am very pleased with where things stand from a visual standpoint I do not see any signs of active infection and in general I think that we are moving in the right direction here. 10-09-2022 upon evaluation today patient appears to be doing well currently in regard to her wound. She has been tolerating the dressing changes without complication.  Fortunately I do not see any signs of active infection at this time which is great news. 10-16-2022 upon evaluation today patient continues to have quite a bit of pain in regard to her right leg. We have been attempting to debride little by little as we could but again was pretty limited by the fact that she is having significant discomfort. We do not actually have any signs of active infection going on at this point that the [facility] topical antibiotics have been helping quite readily. I been attempting to do as much debridement as I can but if she were to have pain medication this would actually help and in the past when she did it was much more tolerable for her as far as taking the edge off. Right now she has been without as she is in transition from her previous pain management physician to someone new to manage this. 10-23-2022 upon evaluation patient appears to be doing excellent in regard to her wound. She has been tolerating the dressing changes without complication. Fortunately I do not see any evidence of active infection locally nor systemically which is great news. No fevers, chills, nausea, vomiting, or diarrhea. 7/24; patient with a wound secondary to chronic venous insufficiency on the right lateral lower leg. We have been using Keystone antibiotic Hydrofera Blue under kerlix Coban. She complains of a lot of pain after last week's debridement otherwise things appear to be improved per discussion with our intake nurse. 11-06-2022 upon evaluation today patient appears to be doing excellent at this point in regard to her wound. She has been tolerating the dressing changes without complication and to be honest she is making excellent progress towards complete closure. I am actually very pleased with where we stand I think that she is doing excellent as far as the overall appearance of the wound is concerned as well. She is going require some debridement however. 11-20-2022 upon evaluation today  patient appears to be doing well currently in regard to her wound. She has been tolerating the dressing changes without complication. Fortunately there does not appear to be any signs of active infection locally or systemically which is great news and in general I do believe that we are moving in the right direction here. No fevers, chills, nausea, vomiting, or diarrhea. 11-27-2022 upon evaluation today patient appears to be doing a little better in regards to the overall appearance of her wound but unfortunately she  is having increased pain. The collagen seems to be getting very dry and stuck to the wound bed which is causing her some issues here. Fortunately I do not see any signs of active infection locally nor systemically at this time. Fortunately I think that her wound is better but unfortunately her pain has not. For that reason I am going to avoid any debridement today since the patient is very upset about the pain and how bad this is hurting at this point. I think we can have to try something a little bit different I am thinking PolyMem may be a good option. 8/28; this patient has difficult venous wounds on the right lateral lower leg and ankle in the setting of chronic venous insufficiency. We have been using polymen under compression with kerlix Coban. Objective Constitutional Sitting or standing Blood Pressure is within target range for patient.. Pulse regular and within target range for patient.Marland Kitchen Respirations regular, non-labored and within target range.. Temperature is normal and within the target range for the patient.Marland Kitchen Appears in no distress. Vitals Time Taken: 10:47 AM, Height: 62 in, Weight: 171 lbs, BMI: 31.3, Pulse: 53 bpm, Respiratory Rate: 18 breaths/min, Blood Pressure: 113/63 mmHg. General Notes: Wound exam; this patient has a improved wound on the right lateral lower leg. However under illumination there is very adherent surface material. I tried to use a scoop curette to  remove some of this although it is hard because of pain issues. Fortunately no evidence of surrounding infection. Her edema is well-controlled with kerlix Coban. Integumentary (Hair, Skin) Wound #5 status is Open. Original cause of wound was Trauma. The date acquired was: 04/17/2022. The wound has been in treatment 28 weeks. The wound is located on the Right,Lateral Lower Leg. The wound measures 10cm length x 7.5cm width x 0.2cm depth; 58.905cm^2 area and 11.781cm^3 volume. There is Fat Layer (Subcutaneous Tissue) exposed. There is no tunneling or undermining noted. There is a medium amount of serosanguineous drainage noted. The wound margin is distinct with the outline attached to the wound base. There is medium (34-66%) red granulation within the wound bed. There is a medium (34-66%) amount of necrotic tissue within the wound bed including Adherent Slough. The periwound skin appearance exhibited: Scarring, Dry/Scaly, Hemosiderin Staining. The periwound skin appearance did not exhibit: Callus, Crepitus, Excoriation, Induration, Rash, Maceration, Atrophie Blanche, Cyanosis, Ecchymosis, Mottled, Pallor, Rubor, Erythema. Periwound temperature was noted as No Abnormality. The periwound has tenderness on palpation. 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 There was a Selective/Open Wound Non-Viable Tissue Debridement with a total area of 41.21 sq cm performed by Maxwell Caul., MD. With the  following instrument(s): Curette to remove Viable and Non-Viable tissue/material. Material removed includes [facility] after achieving pain control using Lidocaine 4% Topical Solution. A time out was conducted at 11:10, prior to the start of the procedure. A Minimum amount of bleeding was controlled with Pressure. The procedure was tolerated well with a pain level of 0 throughout and a pain level of 0 following the procedure. Post Debridement Measurements: 10cm length x 7.5cm width x 0.2cm depth; 11.781cm^3  volume. Character of Wound/Ulcer Post Debridement is improved. Severity of Tissue Post Debridement is: Fat layer exposed. Post procedure Diagnosis Wound #5: Same as Pre-Procedure Plan Follow-up Appointments: Return Appointment in 1 week. [facility] Wednesday Room 8 or 9 weekly 12/11/22 at 10:15am Return Appointment in 2 weeks. [facility] Wednesday Room 8 12/18/22 at 10:15am Return appointment in 3 weeks. [facility] Wednesday 12/25/2022 1015 Other: - home health to wash right leg and foot with soap (dial soap) and water with dressing changes. Patient to have soap and towels next to her when Home Health arrives at the house. *****[facility] compounding topical antibiotics. FOLLOW THE PHARMACY INSTRUCTIONS ON HOW TO MIX, and apply under the Collagen.****** ****PLEASE APPLY ZINC OXIDE as needed AROUND THE PERIWOUND DUE TO MACERATION. (when macerated) ****Patient to bring in the [facility] topical antibiotics to each wound care appt as well.***** *****HOME HEALTH TO PLEASE WRAP FROM BASE OF TOES TO JUST BELOW KNEE. please appy first layer of unna boot at the bast of the toes and just below the knee to help hold the compression wrap in place.**** Anesthetic: Wound #5 Right,Lateral Lower Leg: (In clinic) Topical Lidocaine 4% applied to wound bed Bathing/ Shower/ Hygiene: May shower and wash wound with soap and water. - with dressing changes. Edema Control - Lymphedema / SCD / Other: Elevate legs to the level of  the heart or above for 30 minutes daily and/or when sitting for 3-4 times a day throughout the day. Avoid standing for long periods of time. Exercise regularly Home Health: Wound #5 Right,Lateral Lower Leg: No change in wound care orders this week; continue Home Health for wound care. May utilize formulary equivalent dressing for wound treatment orders unless otherwise specified. - Primary dressing is POLYMEM max Other Home Health Orders/Instructions: - [facility] in [place], [place] fax # [phone]. WOUND #5: - Lower Leg Wound Laterality: Right, Lateral Cleanser: Soap and Water 3 x Per Week/30 Days Discharge Instructions: May shower and wash wound with dial antibacterial soap and water prior to dressing change. Peri-Wound Care: Zinc Oxide Ointment 30g tube 3 x Per Week/30 Days Discharge Instructions: Apply Zinc Oxide to periwound with each dressing change TO ANY MACERATED SKIN. Peri-Wound Care: Sween Lotion (Moisturizing lotion) 3 x Per Week/30 Days Discharge Instructions: Apply moisturizing lotion as directed Topical: compounding topical antibiotics 3 x Per Week/30 Days Discharge Instructions: apply Keystone directly to wound bed under the hydrofera blue. Home Health start. MIX ACCORDING TO THE PHARMACY INSTRUCTIONS. Prim Dressing: PolyMem Non-Adhesive Dressing, 4x4 in 3 x Per Week/30 Days ary Discharge Instructions: Apply to wound bed as instructed Secondary Dressing: ABD Pad, 8x10 3 x Per Week/30 Days Discharge Instructions: Apply over primary dressing as directed. Secondary Dressing: Woven Gauze Sponge, Non-Sterile 4x4 in 3 x Per Week/30 Days Discharge Instructions: Apply over primary dressing as directed. Secondary Dressing: Zetuvit Plus 4x8 in 3 x Per Week/30 Days Discharge Instructions: Or double ABD pad Apply over primary dressing as directed. Com pression Wrap: Kerlix Roll 4.5x3.1 (in/yd) 3 x Per Week/30 Days Discharge Instructions: Apply Kerlix and Coban  compression as directed. Com pression Wrap: Coban Self-Adherent Wrap 4x5 (in/yd) 3 x Per Week/30 Days Discharge Instructions: Apply over Kerlix as directed. Com pression Wrap: unna boot FIRST LAYER **** SEE INSTRUCTIONS 3 x Per Week/30 Days Discharge Instructions: APPLY FIRST LAYER UNNA BOOT AT BASES OF TOES AND JUST BELOW THE KNEE TO HOLD COMPRESSION WRAP IN PLACE. Jamie [name], Jamie [name] ([id number]) [id number] Page 10 of 10 1. Unfortunately this patient  is likely to require further debridements which she has a lot of trouble tolerating. 2. I continued with the polymen and Keystone. 3. Her edema is well-controlled I see no other issues. Electronic Signature(s) Signed: 12/04/2022 5:08:25 PM By: [name] MD Entered By: [name] on 12/04/2022 11:32:58 -------------------------------------------------------------------------------- SuperBill Details Patient Name: Date of Service: Jamie [name], Christean Grief. 12/04/2022 Medical Record Number: [id number] Patient Account Number: [id number] Date of Birth/Sex: Treating RN: [birth year] (82 y.o. Debara Pickett, Yvonne Kendall Primary Care Provider: [name] Other Clinician: Referring Provider: Treating Provider/Extender: Vivia Budge in Treatment: 28 Diagnosis Coding ICD-10 Codes Code Description [phone] Chronic venous hypertension (idiopathic) with ulcer and inflammation of right lower extremity 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d I25.10 Atherosclerotic heart disease of native coronary artery without angina pectoris Facility Procedures : CPT4 Code: [id number] Description: 97597 - DEBRIDE WOUND 1ST 20 SQ CM OR < ICD-10 Diagnosis Description 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d I87.331 Chronic venous hypertension (idiopathic) with ulcer and inflammation of  right  lower Modifier: extremity Quantity: 1 : CPT4 Code: [id number] Description: 97598 - DEBRIDE WOUND EA ADDL 20 SQ CM ICD-10 Diagnosis Description 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d I87.331 Chronic venous hypertension (idiopathic) with ulcer and inflammation of  right lower Modifier: extremity Quantity: 2 Physician Procedures : CPT4 Code Description Modifier [id number] 97597 - WC PHYS DEBR WO ANESTH 20 SQ CM ICD-10 Diagnosis Description 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d I87.331 Chronic venous hypertension (idiopathic) with  ulcer and inflammation of right lower extremity Quantity: 1 : [id number] 97598 - WC PHYS DEBR WO ANESTH EA ADD 20 CM ICD-10 Diagnosis Description 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d I87.331 Chronic venous hypertension (idiopathic) with ulcer and inflammation of  right lower extremity Quantity: 2 Electronic Signature(s) Signed: 12/04/2022 5:08:25 PM By: [name] MD Entered By: [name] on 12/04/2022 11:33:07

## 2022-12-05 NOTE — Progress Notes (Signed)
TYONNA, MINETTI ([id number]) [id number] Page 1 of 8 Visit Report for 12/04/2022 Arrival Information Details Patient Name: Date of Service: Jamie [name], [name]. 12/04/2022 10:15 A M Medical Record Number: [id number] Patient Account Number: [id number] Date of Birth/Sex: Treating RN: 06-21-1940 (82 y.o. F) Primary Care Jamie [name]: Jamie [name] Other Clinician: Referring Jamie [name]: Treating Jamie [name]/Extender: Jamie [name] in Treatment: 28 Visit Information History Since Last Visit All ordered tests and consults were completed: No Patient Arrived: Wheel Chair Added or deleted any medications: No Arrival Time: 10:47 Any new allergies or adverse reactions: No Accompanied By: daughter Had a fall or experienced change in No Transfer Assistance: Manual activities of daily living that may affect Patient Identification Verified: Yes risk of falls: Secondary Verification Process Completed: Yes Signs or symptoms of abuse/neglect since last visito No Patient Requires Transmission-Based Precautions: No Hospitalized since last visit: No Patient Has Alerts: No Implantable device outside of the clinic excluding No cellular tissue based products placed in the center since last visit: Pain Present Now: No Electronic Signature(s) Signed: 12/04/2022 1:34:00 PM By: Jamie [name] Entered By: Jamie [name] on 12/04/2022 10:47:43 -------------------------------------------------------------------------------- Encounter Discharge Information Details Patient Name: Date of Service: Jamie [name], [name] 12/04/2022 10:15 A M Medical Record Number: [id number] Patient Account Number: [id number] Date of Birth/Sex: Treating RN: 1940/07/29 (82 y.o. [name] Primary Care Jamie [name]: Jamie [name] Other Clinician: Referring Jamie [name]: Treating Jamie [name]/Extender: Jamie [name] in Treatment:  28 Encounter Discharge Information Items Post Procedure Vitals Discharge Condition: Stable Pulse (bpm): 53 Ambulatory Status: Wheelchair Respiratory Rate (breaths/min): 18 Discharge Destination: Home Blood Pressure (mmHg): 113/63 Transportation: Private Auto Unable to obtain vitals Reason: drinking water unable to obtain temperature. Accompanied By: niece Schedule Follow-up Appointment: Yes Clinical Summary of Care: Electronic Signature(s) Signed: 12/04/2022 6:19:00 PM By: Jamie Stall RN, BSN Entered By: Jamie [name] on 12/04/2022 11:19:12 Jamie [name] ([id number]) [id number] Page 2 of 8 -------------------------------------------------------------------------------- Lower Extremity Assessment Details Patient Name: Date of Service: Jamie [name], [place] 12/04/2022 10:15 A M Medical Record Number: [id number] Patient Account Number: [id number] Date of Birth/Sex: Treating RN: Feb 28, 1941 (82 y.o. [name] Primary Care Rudy Luhmann: Jamie [name] Other Clinician: Referring Jamie [name]: Treating Jamie [name]/Extender: Jamie [name] in Treatment: 28 Edema Assessment Assessed: [Left: No] [Right: Yes] Edema: [Left: Ye] [Right: s] Calf Left: Right: Point of Measurement: From Medial Instep 37 cm Ankle Left: Right: Point of Measurement: From Medial Instep 26 cm Vascular Assessment Pulses: Dorsalis Pedis Palpable: [Right:Yes] Extremity colors, hair growth, and conditions: Extremity Color: [Right:Normal] Hair Growth on Extremity: [Right:No] Temperature of Extremity: [Right:Warm] Capillary Refill: [Right:< 3 seconds] Dependent Rubor: [Right:No] Blanched when Elevated: [Right:No No] Toe Nail Assessment Left: Right: Thick: Yes Discolored: Yes Deformed: Yes Improper Length and Hygiene: No Electronic Signature(s) Signed: 12/04/2022 6:19:00 PM By: Jamie Stall RN, BSN Entered By: Jamie [name] on  12/04/2022 11:00:14 -------------------------------------------------------------------------------- Multi Wound Chart Details Patient Name: Date of Service: Jamie [name], [name] 12/04/2022 10:15 A M Medical Record Number: [id number] Patient Account Number: [id number] Date of Birth/Sex: Treating RN: 03/18/1941 (82 y.o. F) Primary Care Winda Summerall: Jamie [name] Other Clinician: Referring Carlee Tesfaye: Treating Niamh Rada/Extender: Jamie [name] in Treatment: 28 Vital Signs Height(in): 62 Pulse(bpm): 53 Weight(lbs): 171 Blood Pressure(mmHg): 113/63 Jamie [name], Jamie [name] ([id number]) [id number] Page 3 of 8 Body Mass Index(BMI): 31.3 Temperature(F): Respiratory Rate(breaths/min): 18 [5:Photos:] [N/A:N/A] Right, Lateral Lower Leg N/A N/A Wound Location: Trauma N/A N/A Wounding Event: Venous Leg Ulcer N/A N/A Primary  Etiology: Sleep Apnea, Hypertension, Peripheral N/A N/A Comorbid History: Venous Disease, Osteoarthritis, Neuropathy 04/17/2022 N/A N/A Date Acquired: 31 N/A N/A [name] of Treatment: Open N/A N/A Wound Status: No N/A N/A Wound Recurrence: Yes N/A N/A Clustered Wound: 3 N/A N/A Clustered Quantity: 10x7.5x0.2 N/A N/A Measurements L x W x D (cm) 58.905 N/A N/A A (cm) : rea 11.781 N/A N/A Volume (cm) : -125.10% N/A N/A % Reduction in A rea: -50.10% N/A N/A % Reduction in Volume: Full Thickness Without Exposed N/A N/A Classification: Support Structures Medium N/A N/A Exudate A mount: Serosanguineous N/A N/A Exudate Type: red, brown N/A N/A Exudate Color: Distinct, outline attached N/A N/A Wound Margin: Medium (34-66%) N/A N/A Granulation A mount: Red N/A N/A Granulation Quality: Medium (34-66%) N/A N/A Necrotic A mount: Fat Layer (Subcutaneous Tissue): Yes N/A N/A Exposed Structures: Fascia: No Tendon: No Muscle: No Joint: No Bone: No Medium (34-66%) N/A  N/A Epithelialization: Debridement - Selective/Open Wound N/A N/A Debridement: Pre-procedure Verification/Time Out 11:10 N/A N/A Taken: Lidocaine 4% Topical Solution N/A N/A Pain Control: Slough N/A N/A Tissue Debrided: Non-Viable Tissue N/A N/A Level: 41.21 N/A N/A Debridement A (sq cm): rea Curette N/A N/A Instrument: Minimum N/A N/A Bleeding: Pressure N/A N/A Hemostasis A chieved: 0 N/A N/A Procedural Pain: 0 N/A N/A Post Procedural Pain: Procedure was tolerated well N/A N/A Debridement Treatment Response: 10x7.5x0.2 N/A N/A Post Debridement Measurements L x W x D (cm) 11.781 N/A N/A Post Debridement Volume: (cm) Scarring: Yes N/A N/A Periwound Skin Texture: Excoriation: No Induration: No Callus: No Crepitus: No Rash: No Dry/Scaly: Yes N/A N/A Periwound Skin Moisture: Maceration: No Hemosiderin Staining: Yes N/A N/A Periwound Skin Color: Atrophie Blanche: No Cyanosis: No Ecchymosis: No Erythema: No Mottled: No Pallor: No Rubor: No No Abnormality N/A N/A Temperature: Yes N/A N/A Tenderness on Palpation: Debridement N/A N/A Procedures Performed: IRVA, MORALAS ([id number]) [id number] Page 4 of 8 Treatment Notes Wound #5 (Lower Leg) Wound Laterality: Right, Lateral Cleanser Soap and Water Discharge Instruction: May shower and wash wound with dial antibacterial soap and water prior to dressing change. Peri-Wound Care Zinc Oxide Ointment 30g tube Discharge Instruction: Apply Zinc Oxide to periwound with each dressing change TO ANY MACERATED SKIN. Sween Lotion (Moisturizing lotion) Discharge Instruction: Apply moisturizing lotion as directed Topical compounding topical antibiotics Discharge Instruction: apply Keystone directly to wound bed under the hydrofera blue. Home Health start. MIX ACCORDING TO THE PHARMACY INSTRUCTIONS. Primary Dressing PolyMem Non-Adhesive Dressing, 4x4 in Discharge Instruction: Apply to  wound bed as instructed Secondary Dressing ABD Pad, 8x10 Discharge Instruction: Apply over primary dressing as directed. Woven Gauze Sponge, Non-Sterile 4x4 in Discharge Instruction: Apply over primary dressing as directed. Zetuvit Plus 4x8 in Discharge Instruction: Or double ABD pad Apply over primary dressing as directed. Secured With Compression Wrap Kerlix Roll 4.5x3.1 (in/yd) Discharge Instruction: Apply Kerlix and Coban compression as directed. Coban Self-Adherent Wrap 4x5 (in/yd) Discharge Instruction: Apply over Kerlix as directed. unna boot FIRST LAYER **** SEE INSTRUCTIONS Discharge Instruction: APPLY FIRST LAYER UNNA BOOT AT BASES OF TOES AND JUST BELOW THE KNEE TO HOLD COMPRESSION WRAP IN PLACE. Compression Stockings Add-Ons Electronic Signature(s) Signed: 12/04/2022 5:08:25 PM By: [name] MD Entered By: [name] on 12/04/2022 11:29:59 -------------------------------------------------------------------------------- Multi-Disciplinary Care Plan Details Patient Name: Date of Service: Jamie [name], [place] W. 12/04/2022 10:15 A M Medical Record Number: [id number] Patient Account Number: [id number] Date of Birth/Sex: Treating RN: 1940-12-13 (82 y.o. [name] Primary Care Demitra Danley: Jamie [name] Other Clinician: Referring Janaiyah Blackard: Treating  Trae Bovenzi/Extender: Jamie [name] in Treatment: 28 Multidisciplinary Care Plan reviewed with physician [address] Jamie [name], Jamie [name] ([id number]) [id number] Page 5 of 8 Pain, Acute or Chronic Nursing Diagnoses: Pain Management - Cyclic Acute (Dressing Change Related) Pain Management - Non-cyclic Acute (Procedural) Pain, acute or chronic: actual or potential Goals: Patient will verbalize adequate pain control and receive pain control interventions during procedures as needed Date Initiated: 09/11/2022 Target Resolution Date:  02/06/2023 Goal Status: Active Patient/caregiver will verbalize adequate pain control between visits Date Initiated: 09/11/2022 Target Resolution Date: 02/06/2023 Goal Status: Active Patient/caregiver will verbalize comfort level met Date Initiated: 09/11/2022 Target Resolution Date: 02/06/2023 Goal Status: Active Interventions: Complete pain assessment as per visit requirements Encourage patient to take pain medications as prescribed Provide education on pain management Provision of support: recognize patient pain, provide comfort and support as needed Reposition patient for comfort Treatment Activities: Administer pain control measures as ordered : 09/11/2022 Notes: Electronic Signature(s) Signed: 12/04/2022 6:19:00 PM By: Jamie Stall RN, BSN Entered By: Jamie [name] on 12/04/2022 11:05:50 -------------------------------------------------------------------------------- Pain Assessment Details Patient Name: Date of Service: Jamie Mart W. 12/04/2022 10:15 A M Medical Record Number: [id number] Patient Account Number: [id number] Date of Birth/Sex: Treating RN: 09-11-1940 (82 y.o. F) Primary Care Teighlor Korson: Jamie [name] Other Clinician: Referring Pecola Haxton: Treating Jatavis Malek/Extender: Jamie [name] in Treatment: 28 Active Problems Location of Pain Severity and Description of Pain Patient Has Paino Yes Site Locations Rate the pain. Current Pain Level: 4 Worst Pain Level: 10 Least Pain Level: 0 Tolerable Pain Level: 1 Pain Management and Medication Jamie [name], Jamie [name] ([id number]) [id number] Page 6 of 8 Current Pain Management: Electronic Signature(s) Signed: 12/04/2022 1:34:00 PM By: Jamie [name] Entered By: Jamie [name] on 12/04/2022 10:48:31 -------------------------------------------------------------------------------- Patient/Caregiver Education Details Patient Name: Date of Service: Jamie [name] 8/28/2024andnbsp10:15 A M Medical Record Number: [id number] Patient Account Number: [id number] Date of Birth/Gender: Treating RN: Jul 05, 1940 (82 y.o. [name] Primary Care Physician: Jamie [name] Other Clinician: Referring Physician: Treating Physician/Extender: Jamie [name] in Treatment: 28 Education Assessment Education Provided To: Patient Education Topics Provided Wound/Skin Impairment: Handouts: Caring for Your Ulcer Methods: Explain/Verbal Responses: Reinforcements needed Electronic Signature(s) Signed: 12/04/2022 6:19:00 PM By: Jamie Stall RN, BSN Entered By: Jamie [name] on 12/04/2022 11:06:08 -------------------------------------------------------------------------------- Wound Assessment Details Patient Name: Date of Service: Jamie [name]. 12/04/2022 10:15 A M Medical Record Number: [id number] Patient Account Number: [id number] Date of Birth/Sex: Treating RN: 1941/03/19 (82 y.o. F) Primary Care Jasin Brazel: Jamie [name] Other Clinician: Referring Custer Pimenta: Treating Creek Gan/Extender: Jamie [name] in Treatment: 28 Wound Status Wound Number: 5 Primary Venous Leg Ulcer Etiology: Wound Location: Right, Lateral Lower Leg Wound Open Wounding Event: Trauma Status: Date Acquired: 04/17/2022 Comorbid Sleep Apnea, Hypertension, Peripheral Venous Disease, [name] Of Treatment: 28 History: Osteoarthritis, Neuropathy Clustered Wound: Yes Photos Jamie [name], Jamie [name] ([id number]) [id number] Page 7 of 8 Wound Measurements Length: (cm) Width: (cm) Depth: (cm) Clustered Quantity: Area: (cm) Volume: (cm) 10 % Reduction in Area: -125.1% 7.5 % Reduction in Volume: -50.1% 0.2 Epithelialization: Medium (34-66%) 3 Tunneling: No 58.905 Undermining: No 11.781 Wound Description Classification: Full Thickness Without Exposed  Supp Wound Margin: Distinct, outline attached Exudate Amount: Medium Exudate Type: Serosanguineous Exudate Color: red, brown ort Structures Foul Odor After Cleansing: No Slough/Fibrino Yes Wound Bed Granulation Amount: Medium (34-66%) Exposed Structure Granulation Quality: Red Fascia Exposed: No Necrotic Amount: Medium (34-66%) Fat Layer (Subcutaneous Tissue) Exposed: Yes Necrotic Quality: Adherent Slough  Tendon Exposed: No Muscle Exposed: No Joint Exposed: No Bone Exposed: No Periwound Skin Texture Texture Color No Abnormalities Noted: No No Abnormalities Noted: No Callus: No Atrophie Blanche: No Crepitus: No Cyanosis: No Excoriation: No Ecchymosis: No Induration: No Erythema: No Rash: No Hemosiderin Staining: Yes Scarring: Yes Mottled: No Pallor: No Moisture Rubor: No No Abnormalities Noted: No Dry / Scaly: Yes Temperature / Pain Maceration: No Temperature: No Abnormality Tenderness on Palpation: Yes Treatment Notes Wound #5 (Lower Leg) Wound Laterality: Right, Lateral Cleanser Soap and Water Discharge Instruction: May shower and wash wound with dial antibacterial soap and water prior to dressing change. Peri-Wound Care Zinc Oxide Ointment 30g tube Discharge Instruction: Apply Zinc Oxide to periwound with each dressing change TO ANY MACERATED SKIN. Sween Lotion (Moisturizing lotion) Discharge Instruction: Apply moisturizing lotion as directed Topical compounding topical antibiotics Discharge Instruction: apply Keystone directly to wound bed under the hydrofera blue. Home Health start. MIX ACCORDING TO THE PHARMACY INSTRUCTIONS. Primary Dressing PolyMem Non-Adhesive Dressing, 4x4 in Discharge Instruction: Apply to wound bed as instructed Jamie [name], Jamie [name] ([id number]) [id number] Page 8 of 8 Secondary Dressing ABD Pad, 8x10 Discharge Instruction: Apply over primary dressing as directed. Woven Gauze Sponge, Non-Sterile 4x4  in Discharge Instruction: Apply over primary dressing as directed. Zetuvit Plus 4x8 in Discharge Instruction: Or double ABD pad Apply over primary dressing as directed. Secured With Compression Wrap Kerlix Roll 4.5x3.1 (in/yd) Discharge Instruction: Apply Kerlix and Coban compression as directed. Coban Self-Adherent Wrap 4x5 (in/yd) Discharge Instruction: Apply over Kerlix as directed. unna boot FIRST LAYER **** SEE INSTRUCTIONS Discharge Instruction: APPLY FIRST LAYER UNNA BOOT AT BASES OF TOES AND JUST BELOW THE KNEE TO HOLD COMPRESSION WRAP IN PLACE. Compression Stockings Add-Ons Electronic Signature(s) Signed: 12/04/2022 6:19:00 PM By: Jamie Stall RN, BSN Entered By: Jamie [name] on 12/04/2022 11:05:19 -------------------------------------------------------------------------------- Vitals Details Patient Name: Date of Service: Jamie [name], [name] 12/04/2022 10:15 A M Medical Record Number: [id number] Patient Account Number: [id number] Date of Birth/Sex: Treating RN: Dec 15, 1940 (81 y.o. F) Primary Care Lewi Drost: Jamie [name] Other Clinician: Referring Nataliah Hatlestad: Treating Makiah Clauson/Extender: Jamie [name] in Treatment: 28 Vital Signs Time Taken: 10:47 Pulse (bpm): 53 Height (in): 62 Respiratory Rate (breaths/min): 18 Weight (lbs): 171 Blood Pressure (mmHg): 113/63 Body Mass Index (BMI): 31.3 Reference Range: 80 - 120 mg / dl Electronic Signature(s) Signed: 12/04/2022 1:34:00 PM By: Jamie [name] Entered By: Jamie [name] on 12/04/2022 10:48:07

## 2022-12-11 ENCOUNTER — Encounter (HOSPITAL_BASED_OUTPATIENT_CLINIC_OR_DEPARTMENT_OTHER): Payer: Medicare HMO | Attending: Physician Assistant | Admitting: Physician Assistant

## 2022-12-11 DIAGNOSIS — I872 Venous insufficiency (chronic) (peripheral): Secondary | ICD-10-CM | POA: Insufficient documentation

## 2022-12-11 DIAGNOSIS — I2510 Atherosclerotic heart disease of native coronary artery without angina pectoris: Secondary | ICD-10-CM | POA: Insufficient documentation

## 2022-12-11 DIAGNOSIS — M1990 Unspecified osteoarthritis, unspecified site: Secondary | ICD-10-CM | POA: Diagnosis not present

## 2022-12-11 DIAGNOSIS — I87331 Chronic venous hypertension (idiopathic) with ulcer and inflammation of right lower extremity: Secondary | ICD-10-CM | POA: Insufficient documentation

## 2022-12-11 DIAGNOSIS — L97812 Non-pressure chronic ulcer of other part of right lower leg with fat layer exposed: Secondary | ICD-10-CM | POA: Insufficient documentation

## 2022-12-11 DIAGNOSIS — Z8614 Personal history of Methicillin resistant Staphylococcus aureus infection: Secondary | ICD-10-CM | POA: Insufficient documentation

## 2022-12-11 NOTE — Progress Notes (Addendum)
Jamie, [name] ([id number]) [id number] Page 1 of 11 Visit Report for 12/11/2022 Chief Complaint Document Details Patient Name: Date of Service: Jamie [name], [name]. 12/11/2022 10:15 A M Medical Record Number: [id number] Patient Account Number: [id number] Date of Birth/Sex: Treating RN: 18-Dec-1940 (82 y.o. F) Primary Care Provider: [name] Other Clinician: Referring Provider: Treating Provider/Extender: [name], [name] in Treatment: 29 Information Obtained from: Patient Chief Complaint Right LE Ulcer Electronic Signature(s) Signed: 12/11/2022 10:07:41 AM By: Allen Derry PA-C Entered By: Allen Derry on 12/11/2022 10:07:41 -------------------------------------------------------------------------------- Debridement Details Patient Name: Date of Service: Jamie [name], [name] 12/11/2022 10:15 A M Medical Record Number: [id number] Patient Account Number: [id number] Date of Birth/Sex: Treating RN: 1940-07-24 (82 y.o. Jamie [name] Primary Care Provider: [name] Other Clinician: Referring Provider: Treating Provider/Extender: [name], [name] in Treatment: 29 Debridement Performed for Assessment: Wound #5 Right,Lateral Lower Leg Performed By: Physician Lenda Kelp, PA Debridement Type: Debridement Severity of Tissue Pre Debridement: Fat layer exposed Level of Consciousness (Pre-procedure): Awake and Alert Pre-procedure Verification/Time Out Yes - 11:12 Taken: Start Time: 11:12 Pain Control: Lidocaine 4% T opical Solution Percent of Wound Bed Debrided: 100% T Area Debrided (cm): otal 63.39 Tissue and other material debrided: Viable, Non-Viable, Slough, Subcutaneous, Biofilm, Slough Level: Skin/Subcutaneous Tissue Debridement Description: Excisional Instrument: Curette Bleeding: Large Hemostasis Achieved: Pressure End Time: 11:14 Procedural Pain:  0 Response to Treatment: Procedure was tolerated well Level of Consciousness (Post- Awake and Alert procedure): Post Debridement Measurements of Total Wound Length: (cm) 9.5 Width: (cm) 8.5 Depth: (cm) 0.1 Volume: (cm) 6.342 Character of Wound/Ulcer Post Debridement: Improved Severity of Tissue Post Debridement: Fat layer exposed Jamie, [name] ([id number]) [id number] Page 2 of 11 Post Procedure Diagnosis Same as Pre-procedure Notes Scribed for Allen Derry III PA-C, by [employer]) Signed: 12/11/2022 1:48:54 PM By: Allen Derry PA-C Signed: 12/11/2022 4:01:32 PM By: Karie Schwalbe RN Entered By: Karie Schwalbe on 12/11/2022 11:21:59 -------------------------------------------------------------------------------- HPI Details Patient Name: Date of Service: Jamie Brink, [place] [name] 12/11/2022 10:15 A M Medical Record Number: [id number] Patient Account Number: [id number] Date of Birth/Sex: Treating RN: [birth year] (82 y.o. F) Primary Care Provider: [name] Other Clinician: Referring Provider: Treating Provider/Extender: [name], [name] in Treatment: 29 History of Present Illness HPI Description: ADMISSION 06/30/2020 Jamie [name] is an 82 year old woman who lives in [place]. She is here with her niece for review of wounds on the left medial lower leg and ankle. These have apparently been present for over a year and she followed with Dr. Olegario Messier at the [facility] in [place] for quite a period of time although it looks as though there was a initial consult wound from Dr. Marcha Solders on February 18 presumably there was therefore hiatus. At that point the wounds were described as being there for 3 months. She also tells me she was at the wound care center in [place] for a period of time with this. 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 She  had venous studies that were negative for DVT ABIs on the right were 1.01 on the left 1.06. She has . had previous applications of puraply, compression which she does not tolerate very well. She has had several rounds of oral antibiotic therapy. She complains of unrelenting pain and she is seeing Dr. Reece Agar V of pain management apparently was on oxycodone but that did not help. She also had a  skin biopsy done by Dr. Olegario Messier although we do not have that result. She does not appear to have an arterial issue. I am not completely clear what she has been putting on the wounds lately. 3/31; this patient has a particularly nasty set of wounds on the left medial ankle in the middle of what looks to be hemosiderin deposition secondary to chronic venous insufficiency. She has a lot of pain followed by pain management. Dr. Marcha Solders apparently did a biopsy of something on the left leg last fall what I would like to get this result. She has a history of MRSA treatment. I do not believe she had reflux studies but she did have DVT rule out studies. Previous ABIs have not suggested arterial insufficiency 4/7; difficult wounds on the left medial ankle probably chronic venous insufficiency. With considerable effort on behalf of our case manager we were able to finally to speak to somebody at the hospital in [place] who indicated that no biopsy of this area have been done even though the patient describes this in some detail and is even able to point out where she thinks the biopsy was done. We have been using Sorbact. The PCR culture I did showed polymicrobial identification with Pseudomonas, staph aureus, Peptostreptococcus. All of this and low titers. Resistance genes identified were MRSA, staph virulence gene and tetracycline. We are going to send this to [facility] for a topical antibiotic which is something that we have had good success with recently in large venous ulcers with a lot of purulent drainage. There would not be  an easy oral alternative here possibly line escalated and ciprofloxacin if we need to use systemic antibiotic 4/15; difficult area on the left medial ankle. Most likely chronic venous insufficiency. I think she will probably need venous reflux study I think she had DVT rule outs but not venous reflux studies. We have not yet obtained the topical antibiotics. She has home health changing the dressing we have been using Sorbact for adherent fibrinous debris on the surface. Very difficult to remove 4/22; patient presents for 1 week follow-up. She has been using sore back under compression wraps and these are changed 3 times a week with home health. She also had Keystone antibiotics sent to her house and brought them in today. She has no complaints or issues today. 5/2; patient is here for follow-up. She has been using Sorbact under compression. Very painful wound. She has been using Keystone antibiotics. Not much improvement although the more medial part of the wound has cleaned up nicely and the larger part of the wound about 50% slough covered. Part of the issue here is that she had stays at both [facility] wound care center, [facility] wound care center and now [place]. Not sure if she has had venous reflux studies. As far as we are able to tell she did not have a biopsy. My notes state that she did not have venous reflux studies just DVT rule outs. 5/16; patient goes for venous reflux studies this afternoon. She says that wound was biopsied which sounds like punch biopsies by Dr. Lynden Ang we do not have these results. We are using Sorbact. Very difficult wound to debride 5/23; patient presents for 1 week follow-up. She reports tolerating the wraps well with sorbact underneath. She had ABIs and venous reflux studies done. She has no issues or complaints today. She denies acute signs of infection. 6/6; I have reviewed the patient's vascular studies. Wounds are on the left medial and posterior calf.  She had  significant reflux in the greater saphenous vein in the in the mid thigh, distal thigh knee and the small saphenous vein in the popliteal fossa. The vein diameters do not look too impressive though. She is going to see the vascular surgeon on Wednesday. She also had venous reflux in the right common femoral vein. She did not have any evidence of a DVT or SVT I am wondering whether there is an ablation procedure that would benefit her in the greater saphenous vein on the left. . She tells [place] that home health put the dressing on too tight and she took off 1 layer. The swelling in her left leg is a little worse as a result of this. Not much change in the wound measurements so the surface of the wound looks better Her arterial studies showed an ABI on the right of 1.14 with a triphasic waveform and a great toe pressure of 0.89. On the left her ABIs were noncompressible AVALIN, BRILEY ([id number]) [id number] Page 3 of 11 at 1.34 but with triphasic waveforms and a TBI of 0.97. Her greater toe pressure was 122. There was no evidence of significant bilateral arterial disease 6/20 patient went to see Dr. Durwin Nora. He did not think she had significant arterial disease. In terms of her venous duplex on the right side there was no evidence of a DVT or SVT there was deep venous reflux involving the common femoral vein no superficial vein reflux on the left side there was no DVT or SVT there was no deep vein graft reflux there was some reflux in the greater saphenous vein from the mid thigh to the knee but the vein here was not dilated. He thought these were venous wounds he prescribed a compression pump but I am not sure who we ordered it from The patient has been approved for Apligraf. Still using silver collagen this week 7/5; Apligraf #1 7/19 Apligraf #2. Decent improvement in the condition of the wound bed. Epithelialization distal 8/2 Apligraf #3. No issues or complaints.  Denies signs of infection. 8/17 Apligraf #4. No issues or concerns. Some complaints of pruritus and the rash. Our intake nurse brought up the fact that she had previously indicated possible cotton layer sensitivity we will therefore use kerlix in the bottom layer of the compression 8/30; the patient comes in with the area on her left medial leg just about healed. There is a superficial area more towards the tibia and a smaller open area distally everything else is epithelialized. I do not think she requires another Apligraf 9/13; left medial leg is healed. 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 Her edema control is good Readmisstion: 05-22-2022 upon evaluation today patient presents for initial inspection here in our clinic concerning issues that she has been having with a wound of the right lateral lower extremity. This is an area of a previous skin graft she tells me. With that being said she unfortunately had a scrape on this that occurred around 10 January. Since that time she has noted that this has just continued to get bigger and bigger in her niece who is present with her today actually states that 2 weeks ago when she saw that it was significantly smaller than what she sees currently. Obviously this is of utmost concern as we do not want this to continue to get larger when arrested and get moving in the right direction. Fortunately there does not appear  to be any signs of systemic infection though locally I think we probably do have some infection present. I would obtain a culture to see what we have going on here and then we will subsequently see where things go going forward. Fortunately I think that she is in the right place at this point being at the wound center we can definitely do something to try to get this moving in the right direction. Patient does have a history of chronic venous insufficiency as well as coronary  artery disease. In the past she has done well with compression wraps on the start with a 3 layer wrap I think she is probably can end up going to a 4-layer at some point but we will see how things do over the next week. She has been using wound cleanser along with she tells me a zinc and topical but again I am not sure exactly what that was. 05-29-2022 upon evaluation today patient appears to be doing well currently in regard to her wound. This actually is showing signs of improvement I am happy in that regard unfortunately her left leg has an area on the shin that has opened. This is the region where one of the areas at least we have previously taken care of. Nonetheless this is small hoping we get it under control before things worsen significantly. 06-05-2022 upon evaluation today patient appears to be doing well currently in regard to her wounds. The actually seem to be fairly clean she is having still quite a bit of problem with pain at this point she would like to not have debridement today for all possible. With that being said I do believe that we are making some good progress I think the [facility] is doing a decent job here. 3/6; patient has had 3/6; patient with known severe chronic venous insufficiency. She apparently had a traumatic wound at home and has a reasonably substantial wound on the right lateral lower leg and a smaller one on the left anterior lower leg as well. We have been using Hydrofera Blue and Unna boots 3/13; patient presents for follow-up. We have been using Hydrofera Blue under Coflex to the legs bilaterally. She has no issues or complaints today. 06-26-2022 upon evaluation today patient appears to be doing well currently in regard to her wound. This is measuring a little bit larger however. Fortunately I do not see any signs of infection this is on the right side. Fortunately the left side is actually completely healed which is great news. 07-03-2022 patient's wound  unfortunately is continuing to show signs of being worse. Her compression which was the Tubigrip that I thought should be able to pull up actually slipped down and she was not able to pull that back up. With that being said that means that unfortunately her wound has continued to deteriorate since I last saw her. This is definitely not the direction that we are looking for. I discussed with her that I do believe we need to go ahead and see about getting her started on antibiotics and I subsequently would also like to go ahead and see about getting things moving forward with regard to a wound culture and making a change up her dressings as well but this can be changed more frequently than just once a week. 07-10-2022 upon evaluation today patient actually appears to be doing much better. I do believe that the antibiotics have been beneficial for her. Fortunately I do not see any signs of  active infection locally nor systemically which is great news. No fevers, chills, nausea, vomiting, or diarrhea. 07-17-2022 upon evaluation today patient appears to be doing well currently in regard to her wound which is actually showing signs of improvement this is slow but nonetheless prevalent that we are seeing good improvement here. Fortunately I do not see any signs of active infection locally nor systemically which is great news. 07-24-2022 upon evaluation today patient appears to be doing well currently in regard to her wound although the wrap that we had on has been slipping down and she really does not have anybody to help her with this at home health is not going to be coming out we could not get anybody that would actually be able to do the dressing changes. Fortunately I do not see any signs of active infection locally nor systemically which is great news. 08-07-22 poorly in regard to her leg compared to what it was previous. Fortunately there does not appear to be any signs of active infection locally  nor systemically which is great news. No fevers, chills, nausea, vomiting, or diarrhea. With that being said it does appear that the patient may have some cellulitis in the leg which is not good. 08-14-2022 upon evaluation today patient appears to be doing somewhat better in regard to her wounds she still is having quite a bit of pain we are still not where we want to be as far as healing is concerned completely. Fortunately I do not see any evidence of active infection locally nor systemically which is great news and I am very pleased in that regard. 08-21-23 upon evaluation today patient appears to be doing poorly currently in regard to her wound. She has been tolerating the dressing changes without complication. Fortunately there does not appear to be any signs of active infection locally nor systemically at this time. 08-28-2022 upon evaluation today patient appears to be doing poorly in regard to her wound this was not wrapped appropriately home health did not go up high enough on the wrap. This has caused some issues and I discussed that with the patient today. I do believe that she is going to require aggressive wrapping and treatment which she is getting the [facility] topical antibiotics today they can start using that at the next wrap on Friday. In the meantime they need to make sure that the rapid and appropriately we wrote very specific orders today. 09-04-2022 upon evaluation today patient appears to be doing well currently in regard to her wound which I think is making progress is still hurting her quite significantly. Fortunately I do not see any signs of active infection locally or systemically which is great news. No fevers, chills, nausea, vomiting, or diarrhea. 09-11-2022 upon evaluation today patient's wound actually is showing signs of being a little bit smaller and looking a little bit better she still has a lot going on here however. Fortunately I do not see any evidence of active  infection Worsening locally nor systemically which is great news. With that being said worsening still continue to use the topical Keystone antibiotics. 09-18-2022 upon evaluation today patient actually showing some signs of improvement. I am actually very pleased with where we stand compared to where we have been. I think that she is making good headway here. She is still having quite a bit of pain but it seems to be lessening compared to previous. 09-25-2022 upon evaluation patient's wound actually showing signs slowly but surely of improvement. Fortunately I do  not see any signs of active infection at DELLENE, MCGROARTY ([id number]) [phone].pdf Page 4 of 11 this time systemically and locally I feel like this is dramatically improved. She is doing well with the [facility] topical antibiotics. 10-02-2022 upon evaluation today patient appears to be doing better in regard to her wound overall. I am very pleased with where things stand from a visual standpoint I do not see any signs of active infection and in general I think that we are moving in the right direction here. 10-09-2022 upon evaluation today patient appears to be doing well currently in regard to her wound. She has been tolerating the dressing changes without complication. Fortunately I do not see any signs of active infection at this time which is great news. 10-16-2022 upon evaluation today patient continues to have quite a bit of pain in regard to her right leg. We have been attempting to debride little by little as we could but again was pretty limited by the fact that she is having significant discomfort. We do not actually have any signs of active infection going on at this point that the [facility] topical antibiotics have been helping quite readily. I been attempting to do as much debridement as I can but if she were to have pain medication this would actually help and in the past when she did it was much more  tolerable for her as far as taking the edge off. Right now she has been without as she is in transition from her previous pain management physician to someone new to manage this. 10-23-2022 upon evaluation patient appears to be doing excellent in regard to her wound. She has been tolerating the dressing changes without complication. Fortunately I do not see any evidence of active infection locally nor systemically which is great news. No fevers, chills, nausea, vomiting, or diarrhea. 7/24; patient with a wound secondary to chronic venous insufficiency on the right lateral lower leg. We have been using Keystone antibiotic Hydrofera Blue under kerlix Coban. She complains of a lot of pain after last week's debridement otherwise things appear to be improved per discussion with our intake nurse. 11-06-2022 upon evaluation today patient appears to be doing excellent at this point in regard to her wound. She has been tolerating the dressing changes without complication and to be honest she is making excellent progress towards complete closure. I am actually very pleased with where we stand I think that she is doing excellent as far as the overall appearance of the wound is concerned as well. She is going require some debridement however. 11-20-2022 upon evaluation today patient appears to be doing well currently in regard to her wound. She has been tolerating the dressing changes without complication. Fortunately there does not appear to be any signs of active infection locally or systemically which is great news and in general I do believe that we are moving in the right direction here. No fevers, chills, nausea, vomiting, or diarrhea. 11-27-2022 upon evaluation today patient appears to be doing a little better in regards to the overall appearance of her wound but unfortunately she is having increased pain. The collagen seems to be getting very dry and stuck to the wound bed which is causing her some issues here.  Fortunately I do not see any signs of active infection locally nor systemically at this time. Fortunately I think that her wound is better but unfortunately her pain has not. For that reason I am going to avoid any debridement today since the  patient is very upset about the pain and how bad this is hurting at this point. I think we can have to try something a little bit different I am thinking PolyMem may be a good option. 8/28; this patient has difficult venous wounds on the right lateral lower leg and ankle in the setting of chronic venous insufficiency. We have been using polymen under compression with kerlix Coban. 12-11-2022 upon evaluation today patient appears to be doing well currently in regard to her wound. She has been tolerating the dressing changes without complication and actually appears to be doing much better this week compared to when I saw her 2 weeks ago. The wound is measuring significantly smaller. We are using PolyMem along with Kerlix and Coban. Electronic Signature(s) Signed: 12/11/2022 11:28:16 AM By: Allen Derry PA-C Entered By: Allen Derry on 12/11/2022 11:28:16 -------------------------------------------------------------------------------- Physical Exam Details Patient Name: Date of Service: [name] 12/11/2022 10:15 A M Medical Record Number: [id number] Patient Account Number: [id number] Date of Birth/Sex: Treating RN: Aug 10, 1940 (82 y.o. F) Primary Care Provider: [name] Other Clinician: Referring Provider: Treating Provider/Extender: [name], [name] in Treatment: 24 Constitutional Well-nourished and well-hydrated in no acute distress. Respiratory normal breathing without difficulty. Psychiatric this patient is able to make decisions and demonstrates good insight into disease process. Alert and Oriented x 3. pleasant and cooperative. Notes Upon inspection patient's wound bed actually showed signs of  good granulation epithelization at this point. Fortunately there does not appear to be any signs of active infection at this time. The good news is I feel like the patient is making good headway towards complete closure. Electronic Signature(s) Signed: 12/11/2022 11:29:04 AM By: Allen Derry PA-C Entered By: Allen Derry on 12/11/2022 11:29:04 [name] ([id number]) [id number] Page 5 of 11 -------------------------------------------------------------------------------- Physician Orders Details Patient Name: Date of Service: Jamie [name], [name]. 12/11/2022 10:15 A M Medical Record Number: [id number] Patient Account Number: [id number] Date of Birth/Sex: Treating RN: 05-06-1940 (82 y.o. 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 Leonard Schwartz Wednesday Room 8 12/18/22 at 10:15am Return A ppointment in 2 weeks. Leonard Schwartz Wednesday Room 8 12/25/22 at 10:15am Return A Return appointment in 3 weeks. Leonard Schwartz Wednesday Other: - home health to wash right leg and foot with soap (dial soap) and water with dressing changes. Patient to have soap and towels next to her when Home Health arrives at the house. *****[facility] compounding topical antibiotics. FOLLOW THE PHARMACY INSTRUCTIONS ON HOW TO MIX, and apply under the Collagen.****** ****PLEASE APPLY ZINC OXIDE as needed AROUND THE PERIWOUND DUE TO MACERATION. (when  macerated) ****Patient to bring in the [facility] topical antibiotics to each wound care appt as well.***** *****HOME HEALTH TO PLEASE WRAP FROM BASE OF TOES TO JUST BELOW KNEE. please appy first layer of unna boot at the bast of the toes and just below the knee to help hold the compression wrap in place.**** Anesthetic Wound #5 Right,Lateral Lower Leg (In clinic) Topical Lidocaine 4% applied to wound bed Bathing/ Shower/ Hygiene May shower and wash wound  with soap and water. - with dressing changes. Edema Control - Lymphedema / SCD / Other Right Lower Extremity Elevate legs to the level of the heart or above for 30 minutes daily and/or when sitting for 3-4 times a day throughout the day. Avoid standing for long periods of time. Exercise regularly Home Health Wound #5 Right,Lateral Lower Leg No change in wound care orders this week; continue Home Health for wound care. May utilize formulary equivalent dressing for wound treatment orders unless otherwise specified. - Keystone Antibiotics then Primary dressing is POLYMEM max Other Home Health Orders/Instructions: - [facility] in [place], [place] fax # [phone]. Wound Treatment Wound #5 - Lower Leg Wound Laterality: Right, Lateral Cleanser: Soap and Water 3 x Per Week/30 Days Discharge Instructions: May shower and wash wound with dial antibacterial soap and water prior to dressing change. Peri-Wound Care: Zinc Oxide Ointment 30g tube 3 x Per Week/30 Days Discharge Instructions: Apply Zinc Oxide to periwound with each dressing change TO ANY MACERATED SKIN. Peri-Wound Care: Sween Lotion (Moisturizing lotion) 3 x Per Week/30 Days Discharge Instructions: Apply moisturizing lotion as directed Topical: compounding topical antibiotics 3 x Per Week/30 Days Discharge Instructions: apply Keystone directly to wound bed under the hydrofera blue. Home Health start. MIX ACCORDING TO THE PHARMACY INSTRUCTIONS. Prim Dressing: PolyMem  Non-Adhesive Dressing, 4x4 in 3 x Per Week/30 Days ary Discharge Instructions: Apply to wound bed as instructed NOMA, QUIJAS ([id number]) [phone].pdf Page 6 of 11 Secondary Dressing: ABD Pad, 8x10 3 x Per Week/30 Days Discharge Instructions: Apply over primary dressing as directed. Secondary Dressing: Woven Gauze Sponge, Non-Sterile 4x4 in 3 x Per Week/30 Days Discharge Instructions: Apply over primary dressing as directed. Secondary Dressing: Zetuvit Plus 4x8 in 3 x Per Week/30 Days Discharge Instructions: Or double ABD pad Apply over primary dressing as directed. Compression Wrap: Kerlix Roll 4.5x3.1 (in/yd) 3 x Per Week/30 Days Discharge Instructions: Apply Kerlix and Coban compression as directed. Compression Wrap: Coban Self-Adherent Wrap 4x5 (in/yd) 3 x Per Week/30 Days Discharge Instructions: Apply over Kerlix as directed. Compression Wrap: unna boot FIRST LAYER **** SEE INSTRUCTIONS 3 x Per Week/30 Days Discharge Instructions: APPLY FIRST LAYER UNNA BOOT AT BASES OF TOES AND JUST BELOW THE KNEE TO HOLD COMPRESSION WRAP IN PLACE. Electronic Signature(s) Signed: 12/11/2022 1:48:54 PM By: Allen Derry PA-C Signed: 12/11/2022 4:01:32 PM By: Karie Schwalbe RN Entered By: Karie Schwalbe on 12/11/2022 13:26:22 -------------------------------------------------------------------------------- Problem List Details Patient Name: Date of Service: Jamie [name], [name] 12/11/2022 10:15 A M Medical Record Number: [id number] Patient Account Number: [id number] Date of Birth/Sex: Treating RN: 09-27-1940 (82 y.o. F) Primary Care Provider: [name] Other Clinician: Referring Provider: Treating Provider/Extender: [name], [name] in Treatment: 29 Active Problems ICD-10 Encounter Code Description Active Date MDM Diagnosis I87.331 Chronic venous hypertension (idiopathic) with ulcer and inflammation of right 05/22/2022  No Yes lower extremity L97.812 Non-pressure chronic ulcer of other part of right lower leg with fat layer 05/22/2022 No Yes exposed I25.10 Atherosclerotic heart disease of native coronary artery without angina pectoris 05/22/2022 No Yes Inactive Problems Resolved Problems Electronic Signature(s) Signed: 12/11/2022 10:07:17 AM By: Allen Derry PA-C Entered By: Allen Derry on 12/11/2022 10:07:17 [name] ([id number]) [id number] Page 7 of 11 -------------------------------------------------------------------------------- Progress Note Details Patient Name: Date of Service: Jamie [name], [name]. 12/11/2022 10:15 A M Medical Record Number: [id number] Patient Account Number: [id number] Date of Birth/Sex: Treating RN: [birth year] (82 y.o. F) Primary Care Provider: [name] Other Clinician:  Referring Provider: Treating Provider/Extender: [name], [name] in Treatment: 29 Subjective Chief Complaint Information obtained from Patient Right LE Ulcer History of Present Illness (HPI) ADMISSION 06/30/2020 Mrs. Hector is an 82 year old woman who lives in [place]. She is here with her niece for review of wounds on the left medial lower leg and ankle. These have apparently been present for over a year and she followed with Dr. Olegario Messier at the [facility] in [place] for quite a period of time although it looks as though there was a initial consult wound from Dr. Marcha Solders on February 18 presumably there was therefore hiatus. At that point the wounds were described as being there for 3 months. She also tells me she was at the wound care center in [place] for a period of time with this. 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 She had venous studies that were negative for DVT ABIs on the right were 1.01 on the left 1.06. She has . had previous applications of puraply,  compression which she does not tolerate very well. She has had several rounds of oral antibiotic therapy. She complains of unrelenting pain and she is seeing Dr. Reece Agar V of pain management apparently was on oxycodone but that did not help. She also had a skin biopsy done by Dr. Olegario Messier although we do not have that result. She does not appear to have an arterial issue. I am not completely clear what she has been putting on the wounds lately. 3/31; this patient has a particularly nasty set of wounds on the left medial ankle in the middle of what looks to be hemosiderin deposition secondary to chronic venous insufficiency. She has a lot of pain followed by pain management. Dr. Marcha Solders apparently did a biopsy of something on the left leg last fall what I would like to get this result. She has a history of MRSA treatment. I do not believe she had reflux studies but she did have DVT rule out studies. Previous ABIs have not suggested arterial insufficiency 4/7; difficult wounds on the left medial ankle probably chronic venous insufficiency. With considerable effort on behalf of our case manager we were able to finally to speak to somebody at the hospital in [place] who indicated that no biopsy of this area have been done even though the patient describes this in some detail and is even able to point out where she thinks the biopsy was done. We have been using Sorbact. The PCR culture I did showed polymicrobial identification with Pseudomonas, staph aureus, Peptostreptococcus. All of this and low titers. Resistance genes identified were MRSA, staph virulence gene and tetracycline. We are going to send this to [facility] for a topical antibiotic which is something that we have had good success with recently in large venous ulcers with a lot of purulent drainage. There would not be an easy oral alternative here possibly line escalated and ciprofloxacin if we need to use systemic antibiotic 4/15; difficult area on the  left medial ankle. Most likely chronic venous insufficiency. I think she will probably need venous reflux study I think she had DVT rule outs but not venous reflux studies. We have not yet obtained the topical antibiotics. She has home health changing the dressing we have been using Sorbact for adherent fibrinous debris on the surface. Very difficult to remove 4/22; patient presents for 1 week follow-up. She has been using sore back under compression wraps and these are changed 3  times a week with home health. She also had Keystone antibiotics sent to her house and brought them in today. She has no complaints or issues today. 5/2; patient is here for follow-up. She has been using Sorbact under compression. Very painful wound. She has been using Keystone antibiotics. Not much improvement although the more medial part of the wound has cleaned up nicely and the larger part of the wound about 50% slough covered. Part of the issue here is that she had stays at both [facility] wound care center, [facility] wound care center and now [place]. Not sure if she has had venous reflux studies. As far as we are able to tell she did not have a biopsy. My notes state that she did not have venous reflux studies just DVT rule outs. 5/16; patient goes for venous reflux studies this afternoon. She says that wound was biopsied which sounds like punch biopsies by Dr. Lynden Ang we do not have these results. We are using Sorbact. Very difficult wound to debride 5/23; patient presents for 1 week follow-up. She reports tolerating the wraps well with sorbact underneath. She had ABIs and venous reflux studies done. She has no issues or complaints today. She denies acute signs of infection. 6/6; I have reviewed the patient's vascular studies. Wounds are on the left medial and posterior calf. She had significant reflux in the greater saphenous vein in the in the mid thigh, distal thigh knee and the small saphenous vein in the popliteal fossa.  The vein diameters do not look too impressive though. She is going to see the vascular surgeon on Wednesday. She also had venous reflux in the right common femoral vein. She did not have any evidence of a DVT or SVT I am wondering whether there is an ablation procedure that would benefit her in the greater saphenous vein on the left. . She tells [place] that home health put the dressing on too tight and she took off 1 layer. The swelling in her left leg is a little worse as a result of this. Not much change in the wound measurements so the surface of the wound looks better Her arterial studies showed an ABI on the right of 1.14 with a triphasic waveform and a great toe pressure of 0.89. On the left her ABIs were noncompressible at 1.34 but with triphasic waveforms and a TBI of 0.97. Her greater toe pressure was 122. There was no evidence of significant bilateral arterial disease 6/20 patient went to see Dr. Durwin Nora. He did not think she had significant arterial disease. In terms of her venous duplex on the right side there was no evidence of a DVT or SVT there was deep venous reflux involving the common femoral vein no superficial vein reflux on the left side there was no DVT or SVT there was no deep vein graft reflux there was some reflux in the greater saphenous vein from the mid thigh to the knee but the vein here was not dilated. He thought these were venous wounds he prescribed a compression pump but I am not sure who we ordered it from The patient has been approved for Apligraf. Still using silver collagen this week 7/5; Apligraf #1 7/19 Apligraf #2. Decent improvement in the condition of the wound bed. Epithelialization distal 8/2 Apligraf #3. No issues or complaints. Denies signs of infection. 8/17 Apligraf #4. No issues or concerns. Some complaints of pruritus and the rash. Our intake nurse brought up the fact that she had previously indicated possible  cotton layer sensitivity we will therefore  use kerlix in the bottom layer of the compression 8/30; the patient comes in with the area on her left medial leg just about healed. There is a superficial area more towards the tibia and a smaller open area TALEYA, WHITCHER ([id number]) [id number] Page 8 of 11 distally everything else is epithelialized. I do not think she requires another Apligraf 9/13; left medial leg is healed. 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 Her edema control is good Readmisstion: 05-22-2022 upon evaluation today patient presents for initial inspection here in our clinic concerning issues that she has been having with a wound of the right lateral lower extremity. This is an area of a previous skin graft she tells me. With that being said she unfortunately had a scrape on this that occurred around 10 January. Since that time she has noted that this has just continued to get bigger and bigger in her niece who is present with her today actually states that 2 weeks ago when she saw that it was significantly smaller than what she sees currently. Obviously this is of utmost concern as we do not want this to continue to get larger when arrested and get moving in the right direction. Fortunately there does not appear to be any signs of systemic infection though locally I think we probably do have some infection present. I would obtain a culture to see what we have going on here and then we will subsequently see where things go going forward. Fortunately I think that she is in the right place at this point being at the wound center we can definitely do something to try to get this moving in the right direction. Patient does have a history of chronic venous insufficiency as well as coronary artery disease. In the past she has done well with compression wraps on the start with a 3 layer wrap I think she is probably can end up going to a  4-layer at some point but we will see how things do over the next week. She has been using wound cleanser along with she tells me a zinc and topical but again I am not sure exactly what that was. 05-29-2022 upon evaluation today patient appears to be doing well currently in regard to her wound. This actually is showing signs of improvement I am happy in that regard unfortunately her left leg has an area on the shin that has opened. This is the region where one of the areas at least we have previously taken care of. Nonetheless this is small hoping we get it under control before things worsen significantly. 06-05-2022 upon evaluation today patient appears to be doing well currently in regard to her wounds. The actually seem to be fairly clean she is having still quite a bit of problem with pain at this point she would like to not have debridement today for all possible. With that being said I do believe that we are making some good progress I think the [facility] is doing a decent job here. 3/6; patient has had 3/6; patient with known severe chronic venous insufficiency. She apparently had a traumatic wound at home and has a reasonably substantial wound on the right lateral lower leg and a smaller one on the left anterior lower leg as well. We have been using Hydrofera Blue and Unna boots 3/13; patient presents for follow-up. We have been using Hydrofera Blue under Coflex  to the legs bilaterally. She has no issues or complaints today. 06-26-2022 upon evaluation today patient appears to be doing well currently in regard to her wound. This is measuring a little bit larger however. Fortunately I do not see any signs of infection this is on the right side. Fortunately the left side is actually completely healed which is great news. 07-03-2022 patient's wound unfortunately is continuing to show signs of being worse. Her compression which was the Tubigrip that I thought should be able to pull up actually  slipped down and she was not able to pull that back up. With that being said that means that unfortunately her wound has continued to deteriorate since I last saw her. This is definitely not the direction that we are looking for. I discussed with her that I do believe we need to go ahead and see about getting her started on antibiotics and I subsequently would also like to go ahead and see about getting things moving forward with regard to a wound culture and making a change up her dressings as well but this can be changed more frequently than just once a week. 07-10-2022 upon evaluation today patient actually appears to be doing much better. I do believe that the antibiotics have been beneficial for her. Fortunately I do not see any signs of active infection locally nor systemically which is great news. No fevers, chills, nausea, vomiting, or diarrhea. 07-17-2022 upon evaluation today patient appears to be doing well currently in regard to her wound which is actually showing signs of improvement this is slow but nonetheless prevalent that we are seeing good improvement here. Fortunately I do not see any signs of active infection locally nor systemically which is great news. 07-24-2022 upon evaluation today patient appears to be doing well currently in regard to her wound although the wrap that we had on has been slipping down and she really does not have anybody to help her with this at home health is not going to be coming out we could not get anybody that would actually be able to do the dressing changes. Fortunately I do not see any signs of active infection locally nor systemically which is great news. 08-07-22 poorly in regard to her leg compared to what it was previous. Fortunately there does not appear to be any signs of active infection locally nor systemically which is great news. No fevers, chills, nausea, vomiting, or diarrhea. With that being said it does appear that the patient may have some  cellulitis in the leg which is not good. 08-14-2022 upon evaluation today patient appears to be doing somewhat better in regard to her wounds she still is having quite a bit of pain we are still not where we want to be as far as healing is concerned completely. Fortunately I do not see any evidence of active infection locally nor systemically which is great news and I am very pleased in that regard. 08-21-23 upon evaluation today patient appears to be doing poorly currently in regard to her wound. She has been tolerating the dressing changes without complication. Fortunately there does not appear to be any signs of active infection locally nor systemically at this time. 08-28-2022 upon evaluation today patient appears to be doing poorly in regard to her wound this was not wrapped appropriately home health did not go up high enough on the wrap. This has caused some issues and I discussed that with the patient today. I do believe that she is going to  require aggressive wrapping and treatment which she is getting the [facility] topical antibiotics today they can start using that at the next wrap on Friday. In the meantime they need to make sure that the rapid and appropriately we wrote very specific orders today. 09-04-2022 upon evaluation today patient appears to be doing well currently in regard to her wound which I think is making progress is still hurting her quite significantly. Fortunately I do not see any signs of active infection locally or systemically which is great news. No fevers, chills, nausea, vomiting, or diarrhea. 09-11-2022 upon evaluation today patient's wound actually is showing signs of being a little bit smaller and looking a little bit better she still has a lot going on here however. Fortunately I do not see any evidence of active infection Worsening locally nor systemically which is great news. With that being said worsening still continue to use the topical Keystone  antibiotics. 09-18-2022 upon evaluation today patient actually showing some signs of improvement. I am actually very pleased with where we stand compared to where we have been. I think that she is making good headway here. She is still having quite a bit of pain but it seems to be lessening compared to previous. 09-25-2022 upon evaluation patient's wound actually showing signs slowly but surely of improvement. Fortunately I do not see any signs of active infection at this time systemically and locally I feel like this is dramatically improved. She is doing well with the [facility] topical antibiotics. 10-02-2022 upon evaluation today patient appears to be doing better in regard to her wound overall. I am very pleased with where things stand from a visual standpoint I do not see any signs of active infection and in general I think that we are moving in the right direction here. 10-09-2022 upon evaluation today patient appears to be doing well currently in regard to her wound. She has been tolerating the dressing changes without complication. Fortunately I do not see any signs of active infection at this time which is great news. 10-16-2022 upon evaluation today patient continues to have quite a bit of pain in regard to her right leg. We have been attempting to debride little by little as we could but again was pretty limited by the fact that she is having significant discomfort. We do not actually have any signs of active infection going on at this point that the [facility] topical antibiotics have been helping quite readily. I been attempting to do as much debridement as I can but if she were to have pain medication this would actually help and in the past when she did it was much more tolerable for her as far as taking the edge off. Right now she has been without as she is in transition from her previous pain management physician to someone new to manage this. ANGELEAH, LABRAKE ([id number])  [id number] Page 9 of 11 10-23-2022 upon evaluation patient appears to be doing excellent in regard to her wound. She has been tolerating the dressing changes without complication. Fortunately I do not see any evidence of active infection locally nor systemically which is great news. No fevers, chills, nausea, vomiting, or diarrhea. 7/24; patient with a wound secondary to chronic venous insufficiency on the right lateral lower leg. We have been using Keystone antibiotic Hydrofera Blue under kerlix Coban. She complains of a lot of pain after last week's debridement otherwise things appear to be improved per discussion with our intake nurse. 11-06-2022 upon evaluation today patient appears  to be doing excellent at this point in regard to her wound. She has been tolerating the dressing changes without complication and to be honest she is making excellent progress towards complete closure. I am actually very pleased with where we stand I think that she is doing excellent as far as the overall appearance of the wound is concerned as well. She is going require some debridement however. 11-20-2022 upon evaluation today patient appears to be doing well currently in regard to her wound. She has been tolerating the dressing changes without complication. Fortunately there does not appear to be any signs of active infection locally or systemically which is great news and in general I do believe that we are moving in the right direction here. No fevers, chills, nausea, vomiting, or diarrhea. 11-27-2022 upon evaluation today patient appears to be doing a little better in regards to the overall appearance of her wound but unfortunately she is having increased pain. The collagen seems to be getting very dry and stuck to the wound bed which is causing her some issues here. Fortunately I do not see any signs of active infection locally nor systemically at this time. Fortunately I think that her  wound is better but unfortunately her pain has not. For that reason I am going to avoid any debridement today since the patient is very upset about the pain and how bad this is hurting at this point. I think we can have to try something a little bit different I am thinking PolyMem may be a good option. 8/28; this patient has difficult venous wounds on the right lateral lower leg and ankle in the setting of chronic venous insufficiency. We have been using polymen under compression with kerlix Coban. 12-11-2022 upon evaluation today patient appears to be doing well currently in regard to her wound. She has been tolerating the dressing changes without complication and actually appears to be doing much better this week compared to when I saw her 2 weeks ago. The wound is measuring significantly smaller. We are using PolyMem along with Kerlix and Coban. Objective Constitutional Well-nourished and well-hydrated in no acute distress. Vitals Time Taken: 10:40 AM, Height: 62 in, Weight: 171 lbs, BMI: 31.3, Temperature: 98.6 F, Pulse: 50 bpm, Respiratory Rate: 18 breaths/min, Blood Pressure: 103/61 mmHg. Respiratory normal breathing without difficulty. Psychiatric this patient is able to make decisions and demonstrates good insight into disease process. Alert and Oriented x 3. pleasant and cooperative. General Notes: Upon inspection patient's wound bed actually showed signs of good granulation epithelization at this point. Fortunately there does not appear to be any signs of active infection at this time. The good news is I feel like the patient is making good headway towards complete closure. Integumentary (Hair, Skin) Wound #5 status is Open. Original cause of wound was Trauma. The date acquired was: 04/17/2022. The wound has been in treatment 29 weeks. The wound is located on the Right,Lateral Lower Leg. The wound measures 9.5cm length x 8.5cm width x 0.1cm depth; 63.421cm^2 area and 6.342cm^3 volume.  There is Fat Layer (Subcutaneous Tissue) exposed. There is no tunneling or undermining noted. There is a medium amount of serosanguineous drainage noted. The wound margin is distinct with the outline attached to the wound base. There is medium (34-66%) red granulation within the wound bed. There is a medium (34-66%) amount of necrotic tissue within the wound bed including Adherent Slough. The periwound skin appearance exhibited: Scarring, Dry/Scaly, Hemosiderin Staining. The periwound skin appearance did not exhibit: Callus,  Crepitus, Excoriation, Induration, Rash, Maceration, Atrophie Blanche, Cyanosis, Ecchymosis, Mottled, Pallor, Rubor, Erythema. Periwound temperature was noted as No Abnormality. The periwound has tenderness on palpation. 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 There was a Excisional Skin/Subcutaneous Tissue Debridement with a total area of 63.39 sq cm performed by Lenda Kelp, PA. With the following instrument(s): Curette to remove Viable and Non-Viable tissue/material. Material removed includes Subcutaneous Tissue, Slough, and Biofilm after achieving pain control using Lidocaine 4% Topical Solution. No specimens were taken. A time out was conducted at 11:12, prior to the start of the procedure. A Large amount of bleeding was controlled with Pressure. The procedure was tolerated well with a pain level of 0 throughout. Post Debridement Measurements: OCEANNA, ARRUDA ([id number]) [id number] Page 10 of 11 length x  8.5cm width x 0.1cm depth; 6.342cm^3 volume. Character of Wound/Ulcer Post Debridement is improved. Severity of Tissue Post Debridement is: Fat layer exposed. Post procedure Diagnosis Wound #5: Same as Pre-Procedure General Notes: Scribed for [employer] PA-C, by J.Scotton. Plan Follow-up Appointments: Return Appointment in 1 week. [facility] Schwartz Wednesday Room 8 12/18/22 at 10:15am Return Appointment in 2 weeks. Leonard Schwartz Wednesday Room 8 12/25/22 at 10:15am Return appointment in 3 weeks. Leonard Schwartz Wednesday Other: - home health to wash right leg and foot with soap (dial soap) and water with dressing changes. Patient to have soap and towels next to her when Home Health arrives at the house. *****[facility] compounding topical antibiotics. FOLLOW THE PHARMACY INSTRUCTIONS ON HOW TO MIX, and apply under the Collagen.****** ****PLEASE APPLY ZINC OXIDE as needed AROUND THE PERIWOUND DUE TO MACERATION. (when macerated) ****Patient to bring in the [facility] topical antibiotics to each wound care appt as well.***** *****HOME HEALTH TO PLEASE WRAP FROM BASE OF TOES TO JUST BELOW KNEE. please appy first layer of unna boot at the bast of the toes and just below the knee to help hold the compression wrap in place.**** Anesthetic: Wound #5 Right,Lateral Lower Leg: (In clinic) Topical Lidocaine 4% applied to wound bed Bathing/ Shower/ Hygiene: May shower and wash wound with soap and water. - with dressing changes. Edema Control - Lymphedema / SCD / Other: Elevate legs to the level of the heart or above for 30 minutes daily and/or when sitting for 3-4 times a day throughout the day. Avoid standing for long periods of time. Exercise regularly Home Health: Wound #5 Right,Lateral Lower Leg: No change in wound care orders this week; continue Home Health for wound care. May utilize formulary equivalent dressing for wound treatment orders unless otherwise specified. - Keystone Antibiotics then Primary dressing is  POLYMEM max Other Home Health Orders/Instructions: - [facility] in [place], [place] fax # [phone]. WOUND #5: - Lower Leg Wound Laterality: Right, Lateral Cleanser: Soap and Water 3 x Per Week/30 Days Discharge Instructions: May shower and wash wound with dial antibacterial soap and water prior to dressing change. Peri-Wound Care: Zinc Oxide Ointment 30g tube 3 x Per Week/30 Days Discharge Instructions: Apply Zinc Oxide to periwound with each dressing change TO ANY MACERATED SKIN. Peri-Wound Care: Sween Lotion (Moisturizing lotion) 3 x Per Week/30 Days  Discharge Instructions: Apply moisturizing lotion as directed Topical: compounding topical antibiotics 3 x Per Week/30 Days Discharge Instructions: apply Keystone directly to wound bed under the hydrofera blue. Home Health start. MIX ACCORDING TO THE PHARMACY INSTRUCTIONS. Prim Dressing: PolyMem Non-Adhesive Dressing, 4x4 in 3 x Per Week/30 Days ary Discharge Instructions: Apply to wound bed as instructed Secondary Dressing: ABD Pad, 8x10 3 x Per Week/30 Days Discharge Instructions: Apply over primary dressing as directed. Secondary Dressing: Woven Gauze Sponge, Non-Sterile 4x4 in 3 x Per Week/30 Days Discharge Instructions: Apply over primary dressing as directed. Secondary Dressing: Zetuvit Plus 4x8 in 3 x Per Week/30 Days Discharge Instructions: Or double ABD pad Apply over primary dressing as directed. Com pression Wrap: Kerlix Roll 4.5x3.1 (in/yd) 3 x Per Week/30 Days Discharge Instructions: Apply Kerlix and Coban compression as directed. Com pression Wrap: Coban Self-Adherent Wrap 4x5 (in/yd) 3 x Per Week/30 Days Discharge Instructions: Apply over Kerlix as directed. Com pression Wrap: unna boot FIRST LAYER **** SEE INSTRUCTIONS 3 x Per Week/30 Days Discharge Instructions: APPLY FIRST LAYER UNNA BOOT AT BASES OF TOES AND JUST BELOW THE KNEE TO HOLD COMPRESSION WRAP IN PLACE. 1. I would recommend currently that after  the debridement today the patient's wound looked to be doing much better. I would recommend that we continue with the wound care measures as before. 2. I am going to recommend as well as patient should continue with the PolyMem followed by the Curlex and Coban wrap which I do believe is doing well. 3. I am also going to recommend that the patient should continue to elevate her legs much as possible to help with edema control obviously the more that she can do the better. We will see patient back for reevaluation in 1 week here in the clinic. If anything worsens or changes patient will contact our office for additional recommendations. Electronic Signature(s) Signed: 12/13/2022 2:31:04 PM By: Shawn Stall RN, BSN Signed: 01/07/2023 2:40:07 PM By: Allen Derry PA-C Previous Signature: 12/11/2022 11:29:36 AM Version By: Allen Derry PA-C Entered By: Shawn Stall on 12/13/2022 14:30:38 [name] ([id number]) [id number] Page 11 of 11 -------------------------------------------------------------------------------- SuperBill Details Patient Name: Date of Service: Jamie [name], [place] 12/11/2022 Medical Record Number: [id number] Patient Account Number: [id number] Date of Birth/Sex: Treating RN: [birth year] (82 y.o. F) Primary Care Provider: [name] Other Clinician: Referring Provider: Treating Provider/Extender: [name], [name] in Treatment: 29 Diagnosis Coding ICD-10 Codes Code Description I87.331 Chronic venous hypertension (idiopathic) with ulcer and inflammation of right lower extremity 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d I25.10 Atherosclerotic heart disease of native coronary artery without angina pectoris Facility Procedures : CPT4 Code: 78295621 Description: 11042 - DEB SUBQ TISSUE 20 SQ CM/< ICD-10 Diagnosis Description L97.812 Non-pressure chronic  ulcer of other part of right lower leg with fat layer expo Modifier: sed Quantity: 1 : CPT4 Code: 30865784 Description: 11045 - DEB SUBQ TISS EA ADDL 20CM ICD-10 Diagnosis Description L97.812 Non-pressure chronic ulcer of other part of right lower leg with fat layer expo Modifier: sed Quantity: 3 Physician Procedures : CPT4 Code Description Modifier [id number] 11042 - WC PHYS SUBQ TISS 20 SQ CM ICD-10 Diagnosis Description 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d Quantity: 1 : [id number] 11045 - WC PHYS SUBQ TISS EA ADDL 20 CM ICD-10 Diagnosis Description L97.812 Non-pressure chronic ulcer of other part of right lower leg with fat layer  exposed Quantity: 3 Electronic Signature(s) Signed: 12/11/2022 11:29:55 AM By: Allen Derry PA-C Entered By: Allen Derry on 12/11/2022 11:29:54

## 2022-12-18 ENCOUNTER — Encounter (HOSPITAL_BASED_OUTPATIENT_CLINIC_OR_DEPARTMENT_OTHER): Payer: Medicare HMO | Admitting: Physician Assistant

## 2022-12-18 DIAGNOSIS — I87331 Chronic venous hypertension (idiopathic) with ulcer and inflammation of right lower extremity: Secondary | ICD-10-CM | POA: Diagnosis not present

## 2022-12-18 NOTE — Progress Notes (Signed)
CHAELYN, MONTOY ([id number]) [id number] Page 1 of 2 Visit Report for 12/18/2022 Chief Complaint Document Details Patient Name: Date of Service: Jamie [name], [name]. 12/18/2022 10:15 A M Medical Record Number: [id number] Patient Account Number: [id number] Date of Birth/Sex: Treating RN: 22-Aug-1940 (82 y.o. F) Primary Care Provider: [name] Other Clinician: Referring Provider: Treating Provider/Extender: [name], [name] in Treatment: 30 Information Obtained from: Patient Chief Complaint Right LE Ulcer Electronic Signature(s) Signed: 12/18/2022 10:09:39 AM By: Allen Derry PA-C Entered By: Allen Derry on 12/18/2022 10:09:39 -------------------------------------------------------------------------------- Problem List Details Patient Name: Date of Service: Jamie [name], Jamie [name] 12/18/2022 10:15 A M Medical Record Number: [id number] Patient Account Number: [id number] Date of Birth/Sex: Treating RN: 1940/11/23 (82 y.o. F) Primary Care Provider: [name] Other Clinician: Referring Provider: Treating Provider/Extender: [name], [name] in Treatment: 30 Active Problems ICD-10 Encounter Code Description Active Date MDM Diagnosis I87.331 Chronic venous hypertension (idiopathic) with ulcer and inflammation 05/22/2022 No Yes of right lower extremity L97.812 Non-pressure chronic ulcer of other part of right lower leg with fat 05/22/2022 No Yes layer exposed I25.10 Atherosclerotic heart disease of native coronary artery without angina 05/22/2022 No Yes pectoris Inactive Problems Resolved Problems Electronic Signature(s) Signed: 12/18/2022 10:09:31 AM By: Enis Slipper, Jamie [name] ([id number]) [id number] Page 2 of 2 Entered By: Allen Derry on 12/18/2022 10:09:31

## 2022-12-18 NOTE — Progress Notes (Signed)
Jamie, [name] ([id number]) [id number] Page 1 of 6 Visit Report for 12/11/2022 Arrival Information Details Patient Name: Date of Service: Jamie [name], [name]. 12/11/2022 10:15 A M Medical Record Number: [id number] Patient Account Number: [id number] Date of Birth/Sex: Treating RN: 14-Nov-1940 (82 y.o. F) Primary Care Saagar Tortorella: [name] Other Clinician: Referring Saje Gallop: Treating Sharlette Jansma/Extender: [name], [name] in Treatment: 29 Visit Information History Since Last Visit Added or deleted any medications: No Patient Arrived: Wheel Chair Any new allergies or adverse reactions: No Arrival Time: 10:21 Had a fall or experienced change in No Accompanied By: niece activities of daily living that may affect Transfer Assistance: None risk of falls: Patient Identification Verified: Yes Signs or symptoms of abuse/neglect since last visito No Secondary Verification Process Completed: Yes Hospitalized since last visit: No Patient Requires Transmission-Based Precautions: No Implantable device outside of the clinic excluding No Patient Has Alerts: No cellular tissue based products placed in the center since last visit: Has Dressing in Place as Prescribed: Yes Has Compression in Place as Prescribed: Yes Pain Present Now: Yes Electronic Signature(s) Signed: 12/18/2022 4:44:01 PM By: Thayer Dallas Entered By: Thayer Dallas on 12/11/2022 10:40:04 -------------------------------------------------------------------------------- Encounter Discharge Information Details Patient Name: Date of Service: Jamie [name], [name] 12/11/2022 10:15 A M Medical Record Number: [id number] Patient Account Number: [id number] Date of Birth/Sex: Treating RN: Jan 15, 1941 (82 y.o. Jamie [name] Primary Care Alaa Eyerman: [name] Other Clinician: Referring Kyson Kupper: Treating Aleph Nickson/Extender: [name], [name] in Treatment: 29 Encounter Discharge Information Items Post Procedure Vitals Discharge Condition: Stable Temperature (F): 98.6 Ambulatory Status: Wheelchair Pulse (bpm): 50 Discharge Destination: Home Respiratory Rate (breaths/min): 18 Transportation: Private Auto Blood Pressure (mmHg): 103/61 Accompanied By: self Schedule Follow-up Appointment: Yes Clinical Summary of Care: Patient Declined Electronic Signature(s) Signed: 12/11/2022 4:01:32 PM By: Karie Schwalbe RN Entered By: Karie Schwalbe on 12/11/2022 15:40:54 Jamie [name] ([id number]) [id number] Page 2 of 6 -------------------------------------------------------------------------------- Lower Extremity Assessment Details Patient Name: Date of Service: Jamie [name], [name]. 12/11/2022 10:15 A M Medical Record Number: [id number] Patient Account Number: [id number] Date of Birth/Sex: Treating RN: [birth year] (82 y.o. F) Primary Care Ruthel Martine: [name] Other Clinician: Referring Earma Nicolaou: Treating Shakia Sebastiano/Extender: [name], [name] in Treatment: 29 Edema Assessment Assessed: [Left: No] [Right: No] Edema: [Left: Ye] [Right: s] Calf Left: Right: Point of Measurement: From Medial Instep 36.7 cm Ankle Left: Right: Point of Measurement: From Medial Instep 25.6 cm Vascular Assessment Extremity colors, hair growth, and conditions: Extremity Color: [Right:Normal] Hair Growth on Extremity: [Right:No] Temperature of Extremity: [Right:Warm] Capillary Refill: [Right:< 3 seconds] Dependent Rubor: [Right:No No] Toe Nail Assessment Left: Right: Thick: Yes Discolored: Yes Deformed: Yes Improper Length and Hygiene: Yes Electronic Signature(s) Signed: 12/18/2022 4:44:01 PM By: Thayer Dallas Entered By: Thayer Dallas on 12/11/2022  10:41:26 -------------------------------------------------------------------------------- Multi-Disciplinary Care Plan Details Patient Name: Date of Service: Jamie [name], [name] 12/11/2022 10:15 A M Medical Record Number: [id number] Patient Account Number: [id number] Date of Birth/Sex: Treating RN: Sep 01, 1940 (82 y.o. Jamie [name] Primary Care Dent Plantz: [name] Other Clinician: Referring Bridgid Printz: Treating Hermine Feria/Extender: [name] in Treatment: 29 Multidisciplinary Care Plan reviewed with physician Active Inactive Pain, Acute or Chronic ORILLA, ROGEL ([id number]) [id number] Page 3 of 6 Nursing Diagnoses: Pain Management - Cyclic Acute (Dressing Change Related) Pain Management - Non-cyclic Acute (Procedural) Pain, acute or chronic: actual or potential Goals: Patient will verbalize adequate pain control and receive pain control interventions during  procedures as needed Date Initiated: 09/11/2022 Target Resolution Date: 02/06/2023 Goal Status: Active Patient/caregiver will verbalize adequate pain control between visits Date Initiated: 09/11/2022 Target Resolution Date: 02/06/2023 Goal Status: Active Patient/caregiver will verbalize comfort level met Date Initiated: 09/11/2022 Target Resolution Date: 02/06/2023 Goal Status: Active Interventions: Complete pain assessment as per visit requirements Encourage patient to take pain medications as prescribed Provide education on pain management Provision of support: recognize patient pain, provide comfort and support as needed Reposition patient for comfort Treatment Activities: Administer pain control measures as ordered : 09/11/2022 Notes: Electronic Signature(s) Signed: 12/11/2022 4:01:32 PM By: Karie Schwalbe RN Entered By: Karie Schwalbe on 12/11/2022  15:38:36 -------------------------------------------------------------------------------- Pain Assessment Details Patient Name: Date of Service: Jamie [name], [name] 12/11/2022 10:15 A M Medical Record Number: [id number] Patient Account Number: [id number] Date of Birth/Sex: Treating RN: [birth year] (82 y.o. F) Primary Care Timithy Arons: [name] Other Clinician: Referring Chanler Mendonca: Treating Dardan Shelton/Extender: [name], [name] in Treatment: 29 Active Problems Location of Pain Severity and Description of Pain Patient Has Paino Yes Site Locations Pain Location: Pain in Ulcers Rate the pain. Current Pain Level: 7 Pain Management and Medication Current Pain Management: REKHA, SEPPALA ([id number]) [id number] Page 4 of 6 Electronic Signature(s) Signed: 12/18/2022 4:44:01 PM By: Thayer Dallas Entered By: Thayer Dallas on 12/11/2022 10:31:05 -------------------------------------------------------------------------------- Patient/Caregiver Education Details Patient Name: Date of Service: [name] 9/4/2024andnbsp10:15 A M Medical Record Number: [id number] Patient Account Number: [id number] Date of Birth/Gender: Treating RN: 02-Feb-1941 (82 y.o. Jamie [name] Primary Care Physician: [name] Other Clinician: Referring Physician: Treating Physician/Extender: [name], Billey Gosling in Treatment: 29 Education Assessment Education Provided To: Patient Education Topics Provided Wound/Skin Impairment: Methods: Explain/Verbal Responses: Return demonstration correctly Electronic Signature(s) Signed: 12/11/2022 4:01:32 PM By: Karie Schwalbe RN Entered By: Karie Schwalbe on 12/11/2022 15:39:07 -------------------------------------------------------------------------------- Wound Assessment Details Patient Name: Date of Service: [name]  12/11/2022 10:15 A M Medical Record Number: [id number] Patient Account Number: [id number] Date of Birth/Sex: Treating RN: [birth year] (82 y.o. F) Primary Care Blimy Napoleon: [name] Other Clinician: Referring Raad Clayson: Treating Berklie Dethlefs/Extender: [name], [name] in Treatment: 29 Wound Status Wound Number: 5 Primary Venous Leg Ulcer Etiology: Wound Location: Right, Lateral Lower Leg Wound Open Wounding Event: Trauma Status: Date Acquired: 04/17/2022 Comorbid Sleep Apnea, Hypertension, Peripheral Venous Disease, Weeks Of Treatment: 29 History: Osteoarthritis, Neuropathy Clustered Wound: [address] LILEANA, HUTSELL ([id number]) [id number] Page 5 of 6 Wound Measurements Length: (cm) Width: (cm) Depth: (cm) Clustered Quantity: Area: (cm) Volume: (cm) 9.5 % Reduction in Area: -142.4% 8.5 % Reduction in Volume: 19.2% 0.1 Epithelialization: Medium (34-66%) 2 Tunneling: No 63.421 Undermining: No 6.342 Wound Description Classification: Full Thickness Without Exposed Supp Wound Margin: Distinct, outline attached Exudate Amount: Medium Exudate Type: Serosanguineous Exudate Color: red, brown ort Structures Foul Odor After Cleansing: No Slough/Fibrino Yes Wound Bed Granulation Amount: Medium (34-66%) Exposed Structure Granulation Quality: Red Fascia Exposed: No Necrotic Amount: Medium (34-66%) Fat Layer (Subcutaneous Tissue) Exposed: Yes Necrotic Quality: Adherent Slough Tendon Exposed: No Muscle Exposed: No Joint Exposed: No Bone Exposed: No Periwound Skin Texture Texture Color No Abnormalities Noted: No No Abnormalities Noted: No Callus: No Atrophie Blanche: No Crepitus: No Cyanosis: No Excoriation: No Ecchymosis: No Induration: No Erythema: No Rash: No Hemosiderin Staining: Yes Scarring: Yes Mottled: No Pallor: No Moisture Rubor: No No Abnormalities Noted: No Dry / Scaly: Yes  Temperature / Pain Maceration: No Temperature: No Abnormality Tenderness on Palpation: Yes  Electronic Signature(s) Signed: 12/18/2022 4:44:01 PM By: Thayer Dallas Entered By: Thayer Dallas on 12/11/2022 10:48:50 -------------------------------------------------------------------------------- Vitals Details Patient Name: Date of Service: Jamie Brink, MA [name] 12/11/2022 10:15 A M Medical Record Number: [id number] Patient Account Number: [id number] Date of Birth/Sex: Treating RN: [birth year] (82 y.o. F) Primary Care Kameshia Madruga: [name] Other Clinician: Referring Raea Magallon: Treating Zaveon Gillen/Extender: [name], [place] Weeks in Treatment: [address] Annalucia, Nacke Ashkum W ([id number]) [id number] Page 6 of 6 Time Taken: 10:40 Temperature (F): 98.6 Height (in): 62 Pulse (bpm): 50 Weight (lbs): 171 Respiratory Rate (breaths/min): 18 Body Mass Index (BMI): 31.3 Blood Pressure (mmHg): 103/61 Reference Range: 80 - 120 mg / dl Electronic Signature(s) Signed: 12/18/2022 4:44:01 PM By: Thayer Dallas Entered By: Thayer Dallas on 12/11/2022 10:40:44

## 2022-12-18 NOTE — Progress Notes (Signed)
Jamie [name], Jamie [name] ([id number]) [id number] Page 1 of 8 Visit Report for 12/18/2022 Arrival Information Details Patient Name: Date of Service: Jamie [name], [name]. 12/18/2022 10:15 A M Medical Record Number: [id number] Patient Account Number: [id number] Date of Birth/Sex: Treating RN: 1941/03/18 (82 y.o. F) Primary Care Inessa Wardrop: [name] Other Clinician: Referring Javonne Louissaint: Treating Davon Abdelaziz/Extender: [name], [name] in Treatment: 30 Visit Information History Since Last Visit Added or deleted any medications: No Patient Arrived: Wheel Chair Any new allergies or adverse reactions: No Arrival Time: 10:26 Had a fall or experienced change in No Accompanied By: niece activities of daily living that may affect Transfer Assistance: None risk of falls: Patient Identification Verified: Yes Signs or symptoms of abuse/neglect since last visito No Secondary Verification Process Completed: Yes Hospitalized since last visit: No Patient Requires Transmission-Based Precautions: No Implantable device outside of the clinic excluding No Patient Has Alerts: No cellular tissue based products placed in the center since last visit: Has Dressing in Place as Prescribed: Yes Has Compression in Place as Prescribed: Yes Pain Present Now: Yes Electronic Signature(s) Signed: 12/18/2022 4:43:35 PM By: Thayer Dallas Entered By: Thayer Dallas on 12/18/2022 10:26:57 -------------------------------------------------------------------------------- Clinic Level of Care Assessment Details Patient Name: Date of Service: Jamie [name], [name]. 12/18/2022 10:15 A M Medical Record Number: [id number] Patient Account Number: [id number] Date of Birth/Sex: Treating RN: Dec 30, 1940 (82 y.o. Jamie [name] Primary Care Gracieann Stannard: [name] Other Clinician: Referring Alizzon Dioguardi: Treating Jazari Ober/Extender: [name], [name] in Treatment: 30 Clinic Level of Care Assessment Items TOOL 4 Quantity Score X- 1 0 Use when only an EandM is performed on FOLLOW-UP visit ASSESSMENTS - Nursing Assessment / Reassessment X- 1 10 Reassessment of Co-morbidities (includes updates in patient status) X- 1 5 Reassessment of Adherence to Treatment Plan ASSESSMENTS - Wound and Skin A ssessment / Reassessment X - Simple Wound Assessment / Reassessment - one wound 1 5 []  - 0 Complex Wound Assessment / Reassessment - multiple wounds []  - 0 Dermatologic / Skin Assessment (not related to wound area) ASSESSMENTS - Focused Assessment X- 1 5 Circumferential Edema Measurements - multi extremities []  - 0 Nutritional Assessment / Counseling / Intervention Jamie [name], Jamie [name] ([id number]) [phone].pdf Page 2 of 8 []  - 0 Lower Extremity Assessment (monofilament, tuning fork, pulses) []  - 0 Peripheral Arterial Disease Assessment (using hand held doppler) ASSESSMENTS - Ostomy and/or Continence Assessment and Care []  - 0 Incontinence Assessment and Management []  - 0 Ostomy Care Assessment and Management (repouching, etc.) PROCESS - Coordination of Care X - Simple Patient / Family Education for ongoing care 1 15 []  - 0 Complex (extensive) Patient / Family Education for ongoing care X- 1 10 Staff obtains Chiropractor, Records, T Results / Process Orders est []  - 0 Staff telephones HHA, Nursing Homes / Clarify orders / etc []  - 0 Routine Transfer to another Facility (non-emergent condition) []  - 0 Routine Hospital Admission (non-emergent condition) []  - 0 New Admissions / Manufacturing engineer / Ordering NPWT Apligraf, etc. , []  - 0 Emergency Hospital Admission (emergent condition) []  - 0 Simple Discharge Coordination []  - 0 Complex (extensive) Discharge Coordination PROCESS - Special Needs []  - 0 Pediatric / Minor Patient Management []  - 0 Isolation Patient  Management []  - 0 Hearing / Language / Visual special needs []  - 0 Assessment of Community assistance (transportation, D/C planning, etc.) []  - 0 Additional assistance / Altered mentation []  - 0 Support Surface(s) Assessment (bed, cushion, seat, etc.) INTERVENTIONS -  Wound Cleansing / Measurement X - Simple Wound Cleansing - one wound 1 5 []  - 0 Complex Wound Cleansing - multiple wounds X- 1 5 Wound Imaging (photographs - any number of wounds) []  - 0 Wound Tracing (instead of photographs) X- 1 5 Simple Wound Measurement - one wound []  - 0 Complex Wound Measurement - multiple wounds INTERVENTIONS - Wound Dressings []  - 0 Small Wound Dressing one or multiple wounds []  - 0 Medium Wound Dressing one or multiple wounds X- 1 20 Large Wound Dressing one or multiple wounds X- 1 5 Application of Medications - topical []  - 0 Application of Medications - injection INTERVENTIONS - Miscellaneous []  - 0 External ear exam []  - 0 Specimen Collection (cultures, biopsies, blood, body fluids, etc.) []  - 0 Specimen(s) / Culture(s) sent or taken to Lab for analysis []  - 0 Patient Transfer (multiple staff / Nurse, adult / Similar devices) []  - 0 Simple Staple / Suture removal (25 or less) []  - 0 Complex Staple / Suture removal (26 or more) []  - 0 Hypo / Hyperglycemic Management (close monitor of Blood Glucose) Jamie [name], Jamie [name] ([id number]) [phone].pdf Page 3 of 8 []  - 0 Ankle / Brachial Index (ABI) - do not check if billed separately X- 1 5 Vital Signs Has the patient been seen at the hospital within the last three years: Yes Total Score: 95 Level Of Care: New/Established - Level 3 Electronic Signature(s) Signed: 12/18/2022 2:26:34 PM By: Redmond Pulling RN, BSN Entered By: Redmond Pulling on 12/18/2022 12:05:25 -------------------------------------------------------------------------------- Encounter Discharge Information Details Patient Name: Date of  Service: Jamie [name], [name] 12/18/2022 10:15 A M Medical Record Number: [id number] Patient Account Number: [id number] Date of Birth/Sex: Treating RN: 06/01/1940 (82 y.o. Jamie [name] Primary Care Indria Bishara: [name] Other Clinician: Referring Logyn Kendrick: Treating Chaney Maclaren/Extender: [name], Billey Gosling in Treatment: 30 Encounter Discharge Information Items Discharge Condition: Stable Ambulatory Status: Ambulatory Discharge Destination: Home Transportation: Private Auto Accompanied By: daughter Schedule Follow-up Appointment: Yes Clinical Summary of Care: Patient Declined Electronic Signature(s) Signed: 12/18/2022 2:26:34 PM By: Redmond Pulling RN, BSN Entered By: Redmond Pulling on 12/18/2022 12:07:03 -------------------------------------------------------------------------------- Lower Extremity Assessment Details Patient Name: Date of Service: Jamie [name], [name] 12/18/2022 10:15 A M Medical Record Number: [id number] Patient Account Number: [id number] Date of Birth/Sex: Treating RN: 06-02-1940 (82 y.o. F) Primary Care Rosina Cressler: [name] Other Clinician: Referring Young Brim: Treating Raychell Holcomb/Extender: [name], [name] in Treatment: 30 Edema Assessment Assessed: [Left: No] [Right: No] Edema: [Left: Ye] [Right: s] Calf Left: Right: Point of Measurement: From Medial Instep 36.5 cm Ankle Left: Right: Point of Measurement: From Medial Instep 26 cm Vascular Assessment Left: [[id number] Page 4 of 8Right:] Extremity colors, hair growth, and conditions: Extremity Color: [[id number] Page 4 of 8Normal] Hair Growth on Extremity: [phone].pdf Page 4 of 8No] Temperature of Extremity: [phone].pdf Page 4 of 8Warm] Capillary Refill: [id number] Page 4 of 8< 3  seconds] Dependent Rubor: [[id number].pdf Page 4 of 8No No] Electronic Signature(s) Signed: 12/18/2022 4:43:35 PM By: Thayer Dallas Entered By: Thayer Dallas on 12/18/2022 10:30:50 -------------------------------------------------------------------------------- Multi-Disciplinary Care Plan Details Patient Name: Date of Service: Jamie Brink, MA [name] 12/18/2022 10:15 A M Medical Record Number: [id number] Patient Account Number: [id number] Date of Birth/Sex: Treating RN: Sep 06, 1940 (82 y.o. Jamie [name] Primary Care Shakendra Griffeth: [name] Other Clinician: Referring Nola Botkins: Treating Kelcee Bjorn/Extender: [name] in Treatment: 30 Multidisciplinary Care Plan reviewed with physician Active Inactive Pain, Acute or Chronic Nursing  Diagnoses: Pain Management - Cyclic Acute (Dressing Change Related) Pain Management - Non-cyclic Acute (Procedural) Pain, acute or chronic: actual or potential Goals: Patient will verbalize adequate pain control and receive pain control interventions during procedures as needed Date Initiated: 09/11/2022 Target Resolution Date: 02/06/2023 Goal Status: Active Patient/caregiver will verbalize adequate pain control between visits Date Initiated: 09/11/2022 Target Resolution Date: 02/06/2023 Goal Status: Active Patient/caregiver will verbalize comfort level met Date Initiated: 09/11/2022 Target Resolution Date: 02/06/2023 Goal Status: Active Interventions: Complete pain assessment as per visit requirements Encourage patient to take pain medications as prescribed Provide education on pain management Provision of support: recognize patient pain, provide comfort and support as needed Reposition patient for comfort Treatment Activities: Administer pain control measures as ordered : 09/11/2022 Notes: Electronic Signature(s) Signed: 12/18/2022 2:26:34 PM By: Redmond Pulling RN, BSN Entered  By: Redmond Pulling on 12/18/2022 11:10:55 Jamie [name] ([id number]) [id number] Page 5 of 8 -------------------------------------------------------------------------------- Pain Assessment Details Patient Name: Date of Service: Jamie [name], [name]. 12/18/2022 10:15 A M Medical Record Number: [id number] Patient Account Number: [id number] Date of Birth/Sex: Treating RN: [birth year] (82 y.o. F) Primary Care Mariaha Ellington: [name] Other Clinician: Referring Jamorris Ndiaye: Treating Hutch Rhett/Extender: [name], [name] in Treatment: 30 Active Problems Location of Pain Severity and Description of Pain Patient Has Paino Yes Site Locations Pain Location: Generalized Pain, Pain in Ulcers Rate the pain. Current Pain Level: 10 Character of Pain Describe the Pain: Stabbing Pain Management and Medication Current Pain Management: Electronic Signature(s) Signed: 12/18/2022 4:43:35 PM By: Thayer Dallas Entered By: Thayer Dallas on 12/18/2022 10:30:26 -------------------------------------------------------------------------------- Patient/Caregiver Education Details Patient Name: Date of Service: Jamie [name] 9/11/2024andnbsp10:15 A M Medical Record Number: [id number] Patient Account Number: [id number] Date of Birth/Gender: Treating RN: 1940-07-25 (82 y.o. Jamie [name] Primary Care Physician: [name] Other Clinician: Referring Physician: Treating Physician/Extender: [name], Billey Gosling in Treatment: 30 Education Assessment Education Provided To: Patient Education Topics Provided Wound/Skin ImpairmentIYUNNA, Jamie [name] ([id number]) [phone].pdf Page 6 of 8 Methods: Explain/Verbal Responses: State content correctly Electronic Signature(s) Signed: 12/18/2022 2:26:34 PM By: Redmond Pulling RN, BSN Entered By: Redmond Pulling on  12/18/2022 11:22:50 -------------------------------------------------------------------------------- Wound Assessment Details Patient Name: Date of Service: Jamie [name], [name] 12/18/2022 10:15 A M Medical Record Number: [id number] Patient Account Number: [id number] Date of Birth/Sex: Treating RN: Jan 22, 1941 (82 y.o. F) Primary Care Nevaeha Finerty: [name] Other Clinician: Referring Aron Inge: Treating Yechezkel Fertig/Extender: [name], [name] in Treatment: 30 Wound Status Wound Number: 5 Primary Venous Leg Ulcer Etiology: Wound Location: Right, Lateral Lower Leg Wound Open Wounding Event: Trauma Status: Date Acquired: 04/17/2022 Comorbid Sleep Apnea, Hypertension, Peripheral Venous Disease, Weeks Of Treatment: 30 History: Osteoarthritis, Neuropathy Clustered Wound: Yes Photos Wound Measurements Length: (cm) Width: (cm) Depth: (cm) Clustered Quantity: Area: (cm) Volume: (cm) 11.4 % Reduction in Area: -221.6% 9.4 % Reduction in Volume: -7.2% 0.1 Epithelialization: Medium (34-66%) 2 Tunneling: No 84.163 Undermining: No 8.416 Wound Description Classification: Full Thickness Without Exposed Supp Wound Margin: Distinct, outline attached Exudate Amount: Medium Exudate Type: Serosanguineous Exudate Color: red, brown ort Structures Foul Odor After Cleansing: No Slough/Fibrino Yes Wound Bed Granulation Amount: Medium (34-66%) Exposed Structure Granulation Quality: Red Fascia Exposed: No Necrotic Amount: Medium (34-66%) Fat Layer (Subcutaneous Tissue) Exposed: Yes Tendon Exposed: No Muscle Exposed: No Joint Exposed: No Bone Exposed: No [address] SHAHED, Jamie [name] ([id number]) [id number] Page 7 of 8 No Abnormalities Noted: No No Abnormalities Noted: No Callus:  No Atrophie Blanche: No Crepitus: No Cyanosis: No Excoriation: No Ecchymosis: No Induration: No Erythema:  No Rash: No Hemosiderin Staining: Yes Scarring: Yes Mottled: No Pallor: No Moisture Rubor: No No Abnormalities Noted: No Dry / Scaly: Yes Temperature / Pain Maceration: No Temperature: No Abnormality Tenderness on Palpation: Yes Treatment Notes Wound #5 (Lower Leg) Wound Laterality: Right, Lateral Cleanser Soap and Water Discharge Instruction: May shower and wash wound with dial antibacterial soap and water prior to dressing change. Peri-Wound Care Zinc Oxide Ointment 30g tube Discharge Instruction: Apply Zinc Oxide to periwound with each dressing change TO ANY MACERATED SKIN. Sween Lotion (Moisturizing lotion) Discharge Instruction: Apply moisturizing lotion as directed Topical compounding topical antibiotics Discharge Instruction: apply Keystone directly to wound bed under the hydrofera blue. Home Health start. MIX ACCORDING TO THE PHARMACY INSTRUCTIONS. Primary Dressing PolyMem Non-Adhesive Dressing, 4x4 in Discharge Instruction: Apply to wound bed as instructed Secondary Dressing ABD Pad, 8x10 Discharge Instruction: Apply over primary dressing as directed. Woven Gauze Sponge, Non-Sterile 4x4 in Discharge Instruction: Apply over primary dressing as directed. Zetuvit Plus 4x8 in Discharge Instruction: Or double ABD pad Apply over primary dressing as directed. Secured With Compression Wrap Kerlix Roll 4.5x3.1 (in/yd) Discharge Instruction: Apply Kerlix and Coban compression as directed. Coban Self-Adherent Wrap 4x5 (in/yd) Discharge Instruction: Apply over Kerlix as directed. unna boot FIRST LAYER **** SEE INSTRUCTIONS Discharge Instruction: APPLY FIRST LAYER UNNA BOOT AT BASES OF TOES AND JUST BELOW THE KNEE TO HOLD COMPRESSION WRAP IN PLACE. Compression Stockings Add-Ons Electronic Signature(s) Signed: 12/18/2022 2:47:21 PM By: Tommie Ard RN Entered By: Tommie Ard on 12/18/2022 10:35:55 Vitals  Details -------------------------------------------------------------------------------- Jamie [name] ([id number]) [id number] Page 8 of 8 Patient Name: Date of Service: Jamie [name], [name]. 12/18/2022 10:15 A M Medical Record Number: [id number] Patient Account Number: [id number] Date of Birth/Sex: Treating RN: June 12, 1940 (82 y.o. F) Primary Care Keyly Baldonado: [name] Other Clinician: Referring Chella Chapdelaine: Treating Dhara Schepp/Extender: [name], [name] in Treatment: 30 Vital Signs Time Taken: 10:27 Temperature (F): 98 Height (in): 62 Pulse (bpm): 58 Weight (lbs): 171 Respiratory Rate (breaths/min): 18 Body Mass Index (BMI): 31.3 Blood Pressure (mmHg): 133/62 Reference Range: 80 - 120 mg / dl Electronic Signature(s) Signed: 12/18/2022 4:43:35 PM By: Thayer Dallas Entered By: Thayer Dallas on 12/18/2022 10:29:47

## 2022-12-25 ENCOUNTER — Encounter (HOSPITAL_BASED_OUTPATIENT_CLINIC_OR_DEPARTMENT_OTHER): Payer: Medicare HMO | Admitting: Physician Assistant

## 2022-12-25 DIAGNOSIS — I87331 Chronic venous hypertension (idiopathic) with ulcer and inflammation of right lower extremity: Secondary | ICD-10-CM | POA: Diagnosis not present

## 2022-12-25 NOTE — Progress Notes (Signed)
RUHANI, MEIJER ([id number]) [id number] Page 1 of 2 Visit Report for 12/25/2022 Chief Complaint Document Details Patient Name: Date of Service: Jamie [name], [name]. 12/25/2022 10:15 A M Medical Record Number: [id number] Patient Account Number: [id number] Date of Birth/Sex: Treating RN: 31-Jul-1940 (82 y.o. F) Primary Care Provider: [name] Other Clinician: Referring Provider: Treating Provider/Extender: [name], [name] in Treatment: 31 Information Obtained from: Patient Chief Complaint Right LE Ulcer Electronic Signature(s) Signed: 12/25/2022 10:26:50 AM By: Allen Derry PA-C Entered By: Allen Derry on 12/25/2022 07:26:50 -------------------------------------------------------------------------------- Problem List Details Patient Name: Date of Service: Jamie [name], [name] 12/25/2022 10:15 A M Medical Record Number: [id number] Patient Account Number: [id number] Date of Birth/Sex: Treating RN: 01/26/1941 (82 y.o. 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

## 2022-12-27 NOTE — Progress Notes (Signed)
EDEE, VALERO ([id number]) [id number] Page 1 of 6 Visit Report for 12/25/2022 Arrival Information Details Patient Name: Date of Service: [name], [name]. 12/25/2022 10:15 A M Medical Record Number: [id number] Patient Account Number: [id number] Date of Birth/Sex: Treating RN: 1940/09/10 (82 y.o. F) Primary Care Seung Nidiffer: [name] Other Clinician: Referring Corin Tilly: Treating Cadience Bradfield/Extender: [name], [name] in Treatment: 31 Visit Information History Since Last Visit Added or deleted any medications: No Patient Arrived: Wheel Chair Any new allergies or adverse reactions: No Arrival Time: 10:23 Had a fall or experienced change in No Accompanied By: Daughter activities of daily living that may affect Transfer Assistance: None risk of falls: Patient Identification Verified: Yes Signs or symptoms of abuse/neglect since last visito No Secondary Verification Process Completed: Yes Hospitalized since last visit: No Patient Requires Transmission-Based Precautions: No Implantable device outside of the clinic excluding No Patient Has Alerts: No cellular tissue based products placed in the center since last visit: Has Dressing in Place as Prescribed: Yes Has Compression in Place as Prescribed: No Pain Present Now: Yes Notes compression wrap was inappropriately wrapped from foot to toe. Coban last layer was not covering the heel, and only half wrapped to mid calf with silk tape to kerlix. Coban was not overlapping correctly when applied. Trey Gulbranson made aware. Will call home health to explained the wrap was not appropriately applied. Electronic Signature(s) Signed: 12/25/2022 5:41:52 PM By: Shawn Stall RN, BSN Entered By: Shawn Stall on 12/25/2022 08:08:41 -------------------------------------------------------------------------------- Encounter Discharge Information Details Patient Name: Date of  Service: [name], [name] 12/25/2022 10:15 A M Medical Record Number: [id number] Patient Account Number: [id number] Date of Birth/Sex: Treating RN: 11-30-1940 (82 y.o. [name] Primary Care Mabelle Mungin: [name] Other Clinician: Referring Majorie Santee: Treating Samanta Gal/Extender: [name], [name] in Treatment: 31 Encounter Discharge Information Items Post Procedure Vitals Discharge Condition: Stable Temperature (F): 98 Ambulatory Status: Wheelchair Pulse (bpm): 52 Discharge Destination: Home Respiratory Rate (breaths/min): 18 Transportation: Private Auto Blood Pressure (mmHg): 143/78 Accompanied By: niece Schedule Follow-up Appointment: Yes Clinical Summary of Care: Electronic Signature(s) Signed: 12/25/2022 5:41:52 PM By: Shawn Stall RN, BSN Entered By: Shawn Stall on 12/25/2022 08:15:04 [name] ([id number]) [id number] Page 2 of 6 -------------------------------------------------------------------------------- Lower Extremity Assessment Details Patient Name: Date of Service: [name], [name]. 12/25/2022 10:15 A M Medical Record Number: [id number] Patient Account Number: [id number] Date of Birth/Sex: Treating RN: 08-14-1940 (82 y.o. F) Primary Care Arvine Clayburn: [name] Other Clinician: Referring Lagena Strand: Treating Dakarai Mcglocklin/Extender: [name], [name] in Treatment: 31 Edema Assessment Assessed: [Left: No] [Right: No] Edema: [Left: Ye] [Right: s] Calf Left: Right: Point of Measurement: From Medial Instep 36.5 cm Ankle Left: Right: Point of Measurement: From Medial Instep 26 cm Vascular Assessment Pulses: Dorsalis Pedis Palpable: [Right:Yes] Extremity colors, hair growth, and conditions: Extremity Color: [Right:Normal] Hair Growth on Extremity: [Right:No] Temperature of Extremity: [Right:Warm] Capillary Refill: [Right:< 3  seconds] Dependent Rubor: [Right:No No] Electronic Signature(s) Signed: 12/27/2022 12:48:07 PM By: Thayer Dallas Entered By: Thayer Dallas on 12/25/2022 07:27:56 -------------------------------------------------------------------------------- Multi-Disciplinary Care Plan Details Patient Name: Date of Service: [name], MA [name] 12/25/2022 10:15 A M Medical Record Number: [id number] Patient Account Number: [id number] Date of Birth/Sex: Treating RN: Apr 02, 1941 (82 y.o. [name] Primary Care Adam Sanjuan: [name] Other Clinician: Referring Jessiah Steinhart: Treating Klaudia Beirne/Extender: [name] in Treatment: 31 Multidisciplinary Care Plan reviewed with physician Active Inactive Pain, Acute or Chronic Nursing Diagnoses: Pain  Induration: No Erythema: No Rash: No Hemosiderin Staining: Yes Scarring: Yes Mottled: No Pallor: No Moisture Rubor: No No Abnormalities Noted:  No Dry / Scaly: Yes Temperature / Pain Maceration: No Temperature: No Abnormality Tenderness on Palpation: Yes Treatment Notes Wound #5 (Lower Leg) Wound Laterality: Right, Lateral Cleanser Soap and Water Discharge Instruction: May shower and wash wound with dial antibacterial soap and water prior to dressing change. Peri-Wound Care Zinc Oxide Ointment 30g tube Discharge Instruction: Apply Zinc Oxide to periwound with each dressing change TO ANY MACERATED SKIN. Sween Lotion (Moisturizing lotion) Discharge Instruction: Apply moisturizing lotion as directed Topical compounding topical antibiotics Discharge Instruction: apply Keystone directly to wound bed under the hydrofera blue. Home Health start. MIX ACCORDING TO THE PHARMACY INSTRUCTIONS. Primary Dressing PolyMem Non-Adhesive Dressing, 4x4 in Discharge Instruction: ******CUT SLITS INTO THE POLYMEM.***** Apply to wound bed as instructed Secondary Dressing ABD Pad, 8x10 Discharge Instruction: Apply over primary dressing as directed. Woven Gauze Sponge, Non-Sterile 4x4 in Discharge Instruction: Apply over primary dressing as directed. Zetuvit Plus 4x8 in Discharge Instruction: Or double ABD pad Apply over primary dressing as directed. Secured With ASHLAND, HUTH ([id number]) [id number] Page 6 of 6 Compression Wrap Kerlix Roll 4.5x3.1 (in/yd) Discharge Instruction: Apply Kerlix and Coban compression as directed. Coban Self-Adherent Wrap 4x5 (in/yd) Discharge Instruction: Apply over Kerlix as directed. unna boot FIRST LAYER **** SEE INSTRUCTIONS Discharge Instruction: APPLY FIRST LAYER UNNA BOOT AT BASES OF TOES AND JUST BELOW THE KNEE TO HOLD COMPRESSION WRAP IN PLACE. Compression Stockings Add-Ons Electronic Signature(s) Signed: 12/27/2022 12:48:07 PM By: Thayer Dallas Entered By: Thayer Dallas on 12/25/2022  07:21:12 -------------------------------------------------------------------------------- Vitals Details Patient Name: Date of Service: [name], MA [name] 12/25/2022 10:15 A M Medical Record Number: [id number] Patient Account Number: [id number] Date of Birth/Sex: Treating RN: 09-18-1940 (82 y.o. F) Primary Care Pierrette Scheu: [name] Other Clinician: Referring Adeja Sarratt: Treating Jamiracle Avants/Extender: [name], [name] in Treatment: 31 Vital Signs Time Taken: 10:22 Temperature (F): 98.0 Height (in): 62 Pulse (bpm): 52 Weight (lbs): 171 Respiratory Rate (breaths/min): 18 Body Mass Index (BMI): 31.3 Blood Pressure (mmHg): 143/78 Reference Range: 80 - 120 mg / dl Electronic Signature(s) Signed: 12/27/2022 12:48:07 PM By: Thayer Dallas Entered By: Thayer Dallas on 12/25/2022 07:23:34  EDEE, VALERO ([id number]) [id number] Page 1 of 6 Visit Report for 12/25/2022 Arrival Information Details Patient Name: Date of Service: [name], [name]. 12/25/2022 10:15 A M Medical Record Number: [id number] Patient Account Number: [id number] Date of Birth/Sex: Treating RN: 1940/09/10 (82 y.o. F) Primary Care Seung Nidiffer: [name] Other Clinician: Referring Corin Tilly: Treating Cadience Bradfield/Extender: [name], [name] in Treatment: 31 Visit Information History Since Last Visit Added or deleted any medications: No Patient Arrived: Wheel Chair Any new allergies or adverse reactions: No Arrival Time: 10:23 Had a fall or experienced change in No Accompanied By: Daughter activities of daily living that may affect Transfer Assistance: None risk of falls: Patient Identification Verified: Yes Signs or symptoms of abuse/neglect since last visito No Secondary Verification Process Completed: Yes Hospitalized since last visit: No Patient Requires Transmission-Based Precautions: No Implantable device outside of the clinic excluding No Patient Has Alerts: No cellular tissue based products placed in the center since last visit: Has Dressing in Place as Prescribed: Yes Has Compression in Place as Prescribed: No Pain Present Now: Yes Notes compression wrap was inappropriately wrapped from foot to toe. Coban last layer was not covering the heel, and only half wrapped to mid calf with silk tape to kerlix. Coban was not overlapping correctly when applied. Trey Gulbranson made aware. Will call home health to explained the wrap was not appropriately applied. Electronic Signature(s) Signed: 12/25/2022 5:41:52 PM By: Shawn Stall RN, BSN Entered By: Shawn Stall on 12/25/2022 08:08:41 -------------------------------------------------------------------------------- Encounter Discharge Information Details Patient Name: Date of  Service: [name], [name] 12/25/2022 10:15 A M Medical Record Number: [id number] Patient Account Number: [id number] Date of Birth/Sex: Treating RN: 11-30-1940 (82 y.o. [name] Primary Care Mabelle Mungin: [name] Other Clinician: Referring Majorie Santee: Treating Samanta Gal/Extender: [name], [name] in Treatment: 31 Encounter Discharge Information Items Post Procedure Vitals Discharge Condition: Stable Temperature (F): 98 Ambulatory Status: Wheelchair Pulse (bpm): 52 Discharge Destination: Home Respiratory Rate (breaths/min): 18 Transportation: Private Auto Blood Pressure (mmHg): 143/78 Accompanied By: niece Schedule Follow-up Appointment: Yes Clinical Summary of Care: Electronic Signature(s) Signed: 12/25/2022 5:41:52 PM By: Shawn Stall RN, BSN Entered By: Shawn Stall on 12/25/2022 08:15:04 [name] ([id number]) [id number] Page 2 of 6 -------------------------------------------------------------------------------- Lower Extremity Assessment Details Patient Name: Date of Service: [name], [name]. 12/25/2022 10:15 A M Medical Record Number: [id number] Patient Account Number: [id number] Date of Birth/Sex: Treating RN: 08-14-1940 (82 y.o. F) Primary Care Arvine Clayburn: [name] Other Clinician: Referring Lagena Strand: Treating Dakarai Mcglocklin/Extender: [name], [name] in Treatment: 31 Edema Assessment Assessed: [Left: No] [Right: No] Edema: [Left: Ye] [Right: s] Calf Left: Right: Point of Measurement: From Medial Instep 36.5 cm Ankle Left: Right: Point of Measurement: From Medial Instep 26 cm Vascular Assessment Pulses: Dorsalis Pedis Palpable: [Right:Yes] Extremity colors, hair growth, and conditions: Extremity Color: [Right:Normal] Hair Growth on Extremity: [Right:No] Temperature of Extremity: [Right:Warm] Capillary Refill: [Right:< 3  seconds] Dependent Rubor: [Right:No No] Electronic Signature(s) Signed: 12/27/2022 12:48:07 PM By: Thayer Dallas Entered By: Thayer Dallas on 12/25/2022 07:27:56 -------------------------------------------------------------------------------- Multi-Disciplinary Care Plan Details Patient Name: Date of Service: [name], MA [name] 12/25/2022 10:15 A M Medical Record Number: [id number] Patient Account Number: [id number] Date of Birth/Sex: Treating RN: Apr 02, 1941 (82 y.o. [name] Primary Care Adam Sanjuan: [name] Other Clinician: Referring Jessiah Steinhart: Treating Klaudia Beirne/Extender: [name] in Treatment: 31 Multidisciplinary Care Plan reviewed with physician Active Inactive Pain, Acute or Chronic Nursing Diagnoses: Pain

## 2023-01-01 ENCOUNTER — Encounter (HOSPITAL_BASED_OUTPATIENT_CLINIC_OR_DEPARTMENT_OTHER): Payer: Medicare HMO | Admitting: Physician Assistant

## 2023-01-01 DIAGNOSIS — I87331 Chronic venous hypertension (idiopathic) with ulcer and inflammation of right lower extremity: Secondary | ICD-10-CM | POA: Diagnosis not present

## 2023-01-01 NOTE — Progress Notes (Signed)
KARAH, SHOMPER ([id number]) [id number] Page 1 of 2 Visit Report for 01/01/2023 Chief Complaint Document Details Patient Name: Date of Service: Jamie [name], [name]. 01/01/2023 10:15 A M Medical Record Number: [id number] Patient Account Number: [id number] Date of Birth/Sex: Treating RN: [birth year] (82 y.o. F) Primary Care Provider: [name] Other Clinician: Referring Provider: Treating Provider/Extender: [name], [name] Information Obtained from: Patient Chief Complaint Right LE Ulcer Electronic Signature(s) Signed: 01/01/2023 11:04:25 AM By: Allen Derry PA-C Entered By: Allen Derry on 01/01/2023 08:04:25 -------------------------------------------------------------------------------- Problem List Details Patient Name: Date of Service: Jamie [name]. 01/01/2023 10:15 A M Medical Record Number: [id number] Patient Account Number: [id number] Date of Birth/Sex: Treating RN: 02-06-1941 (82 y.o. 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

## 2023-01-02 NOTE — Progress Notes (Signed)
KILEY, FOWLER ([id number]) [id number] Page 1 of 8 Visit Report for 01/01/2023 Arrival Information Details Patient Name: Date of Service: Jamie [name], [name]. 01/01/2023 10:15 A M Medical Record Number: [id number] Patient Account Number: [id number] Date of Birth/Sex: Treating RN: 1940/06/03 (82 y.o. Jamie [name], Jamie [name] Primary Care [name]: Jamie [name] Other Clinician: Referring Jamie [name]: Treating Jamie [name]/Extender: Jamie [name], Jamie [name] in Treatment: 32 Visit Information History Since Last Visit All ordered tests and consults were completed: No Patient Arrived: Wheel Chair Added or deleted any medications: No Arrival Time: 10:57 Any new allergies or adverse reactions: No Accompanied By: niece Had a fall or experienced change in No Transfer Assistance: Other activities of daily living that may affect Patient Identification Verified: Yes risk of falls: Secondary Verification Process Completed: Yes Signs or symptoms of abuse/neglect since last visito No Patient Requires Transmission-Based Precautions: No Hospitalized since last visit: No Patient Has Alerts: No Implantable device outside of the clinic excluding No cellular tissue based products placed in the center since last visit: Has Dressing in Place as Prescribed: Yes Has Compression in Place as Prescribed: Yes Pain Present Now: Yes Electronic Signature(s) Signed: 01/02/2023 4:13:22 PM By: Hunt Oris Entered By: Hunt Oris on 01/01/2023 10:57:49 -------------------------------------------------------------------------------- Encounter Discharge Information Details Patient Name: Date of Service: Jamie [name], Jamie [name] 01/01/2023 10:15 A M Medical Record Number: [id number] Patient Account Number: [id number] Date of Birth/Sex: Treating RN: May 18, 1940 (82 y.o. Jamie [name] Primary Care Jamie [name]: Jamie [name] Other Clinician: Referring  Jamie [name]: Treating Jamie [name]/Extender: Jamie [name] [name] in Treatment: 32 Encounter Discharge Information Items Post Procedure Vitals Discharge Condition: Stable Temperature (F): 97.9 Ambulatory Status: Wheelchair Pulse (bpm): 62 Discharge Destination: Home Respiratory Rate (breaths/min): 18 Transportation: Private Auto Blood Pressure (mmHg): 123/78 Accompanied By: niece Schedule Follow-up Appointment: Yes Clinical Summary of Care: Patient Declined Electronic Signature(s) Signed: 01/02/2023 4:13:22 PM By: Hunt Oris Entered By: Hunt Oris on 01/01/2023 11:25:25 [name] ([id number]) [id number] Page 2 of 8 -------------------------------------------------------------------------------- Lower Extremity Assessment Details Patient Name: Date of Service: Jamie [name], [name]. 01/01/2023 10:15 A M Medical Record Number: [id number] Patient Account Number: [id number] Date of Birth/Sex: Treating RN: 09-10-1940 (82 y.o. Jamie [name] Primary Care Jamie [name]: Jamie [name] Other Clinician: Referring Kaidan Spengler: Treating Jamie [name]/Extender: Jamie [name], Jamie [name] in Treatment: 32 Edema Assessment Assessed: [Left: No] [Right: No] Edema: [Left: Ye] [Right: s] Calf Left: Right: Point of Measurement: From Medial Instep 35.5 cm Ankle Left: Right: Point of Measurement: From Medial Instep 24.5 cm Vascular Assessment Pulses: Dorsalis Pedis Palpable: [Left:Yes] [Right:Yes] Extremity colors, hair growth, and conditions: Extremity Color: [Right:Normal] Hair Growth on Extremity: [Right:No] Temperature of Extremity: [Right:Warm] Capillary Refill: [Right:< 3 seconds] Dependent Rubor: [Right:No No] Toe Nail Assessment Left: Right: Thick: Yes Discolored: No Deformed: No Improper Length and Hygiene: No Electronic Signature(s) Signed: 01/02/2023 4:13:22 PM By: Hunt Oris Entered By: Hunt Oris on 01/01/2023 11:02:31 -------------------------------------------------------------------------------- Multi-Disciplinary Care Plan Details Patient Name: Date of Service: Jamie [name], Jamie [name] 01/01/2023 10:15 A M Medical Record Number: [id number] Patient Account Number: [id number] Date of Birth/Sex: Treating RN: [birth year] (82 y.o. Jamie [name], Jamie [name] Primary Care Jamie [name]: Jamie [name] Other Clinician: Referring Jamie [name]: Treating Jamie [name]/Extender: Jamie [name] in Treatment: [address] Multidisciplinary Care Plan reviewed with physician Jamie [name] ([id number]) [id number] Page 3 of 8 Pain, Acute or Chronic Nursing Diagnoses: Pain Management - Cyclic Acute (Dressing Change Related) Pain Management - Non-cyclic Acute (Procedural) Pain, acute or  chronic: actual or potential Goals: Patient will verbalize adequate pain control and receive pain control interventions during procedures as needed Date Initiated: 09/11/2022 Target Resolution Date: 02/06/2023 Goal Status: Active Patient/caregiver will verbalize adequate pain control between visits Date Initiated: 09/11/2022 Target Resolution Date: 02/06/2023 Goal Status: Active Patient/caregiver will verbalize comfort level met Date Initiated: 09/11/2022 Target Resolution Date: 02/06/2023 Goal Status: Active Interventions: Complete pain assessment as per visit requirements Encourage patient to take pain medications as prescribed Provide education on pain management Provision of support: recognize patient pain, provide comfort and support as needed Reposition patient for comfort Treatment Activities: Administer pain control measures as ordered : 09/11/2022 Notes: Electronic Signature(s) Signed: 01/02/2023 4:13:22 PM By: Hunt Oris Entered By: Hunt Oris on 01/01/2023  11:11:58 -------------------------------------------------------------------------------- Pain Assessment Details Patient Name: Date of Service: Jamie [name]. 01/01/2023 10:15 A M Medical Record Number: [id number] Patient Account Number: [id number] Date of Birth/Sex: Treating RN: 1941-03-06 (82 y.o. Jamie [name] Primary Care Brelyn Woehl: Jamie [name] Other Clinician: Referring [name]: Treating [name]/Extender: Jamie [name], Jamie [name] in Treatment: 32 Active Problems Location of Pain Severity and Description of Pain Patient Has Paino Yes Site Locations Pain Location: Pain in Ulcers With Dressing Change: Yes Duration of the Pain. Constant / Intermittento Intermittent Rate the pain. Current Pain Level: [address] ([id number]) [id number] Page 4 of 8 Character of Pain Describe the Pain: Tender, Throbbing Pain Management and Medication Current Pain Management: Medication: Yes How does your wound impact your activities of daily livingo Sleep: Yes Electronic Signature(s) Signed: 01/02/2023 4:13:22 PM By: Hunt Oris Entered By: Hunt Oris on 01/01/2023 11:01:34 -------------------------------------------------------------------------------- Patient/Caregiver Education Details Patient Name: Date of Service: Jamie [name] 9/25/2024andnbsp10:15 A M Medical Record Number: [id number] Patient Account Number: [id number] Date of Birth/Gender: Treating RN: 1940/12/05 (82 y.o. Jamie [name] Primary Care Physician: Jamie [name] Other Clinician: Referring Physician: Treating Physician/Extender: Jamie [name], Billey Gosling in Treatment: 22 Education Assessment Education Provided To: Patient and Caregiver Education Topics Provided Pain: Handouts: A Guide to Pain Control Methods: Explain/Verbal Responses: Reinforcements needed Electronic Signature(s) Signed:  01/02/2023 4:13:22 PM By: Hunt Oris Entered By: Hunt Oris on 01/01/2023 11:12:34 -------------------------------------------------------------------------------- Wound Assessment Details Patient Name: Date of Service: Jamie [name]. 01/01/2023 10:15 A M Medical Record Number: [id number] Patient Account Number: [id number] Date of Birth/Sex: Treating RN: 1941/02/07 (82 y.o. Jamie [name] Primary Care Sarahy Creedon: Jamie [name] Other Clinician: Referring Giavonni Cizek: Treating Lerry Cordrey/Extender: Jamie [name], Jamie [name] in Treatment: 32 Wound Status Wound Number: 5 Primary Venous Leg Ulcer Etiology: Wound Location: Right, Lateral Lower Leg Wound Open Wounding Event: Trauma Status: Date Acquired: 04/17/2022 Comorbid Sleep Apnea, Hypertension, Peripheral Venous Disease, [name] Of Treatment: 32 History: Osteoarthritis, Neuropathy Clustered Wound: Yes Jamie, [name] ([id number]) [id number] Page 5 of 8 Photos Wound Measurements Length: (cm) Width: (cm) Depth: (cm) Clustered Quantity: Area: (cm) Volume: (cm) 6.7 % Reduction in Area: 9.5% 4.5 % Reduction in Volume: 69.8% 0.1 Epithelialization: Medium (34-66%) 1 Tunneling: No 23.68 Undermining: No 2.368 Wound Description Classification: Full Thickness Without Exposed Supp Wound Margin: Distinct, outline attached Exudate Amount: Medium Exudate Type: Serosanguineous Exudate Color: red, brown ort Structures Foul Odor After Cleansing: No Slough/Fibrino Yes Wound Bed Granulation Amount: Large (67-100%) Exposed Structure Granulation Quality: Red Fascia Exposed: No Necrotic Amount: Small (1-33%) Fat Layer (Subcutaneous Tissue) Exposed: Yes Necrotic Quality: Adherent Slough Tendon Exposed: No Muscle Exposed: No Joint Exposed: No Bone Exposed: No Periwound Skin Texture Texture Color No  Abnormalities Noted: No No Abnormalities Noted: No Callus: No Atrophie  Blanche: No Crepitus: No Cyanosis: No Excoriation: No Ecchymosis: No Induration: No Erythema: No Rash: No Hemosiderin Staining: Yes Scarring: Yes Mottled: No Pallor: No Moisture Rubor: No No Abnormalities Noted: No Dry / Scaly: Yes Temperature / Pain Maceration: No Temperature: No Abnormality Tenderness on Palpation: Yes Assessment Notes Separating the wounds Treatment Notes Wound #5 (Lower Leg) Wound Laterality: Right, Lateral Cleanser Soap and Water Discharge Instruction: May shower and wash wound with dial antibacterial soap and water prior to dressing change. Peri-Wound Care Zinc Oxide Ointment 30g tube Discharge Instruction: Apply Zinc Oxide to periwound with each dressing change TO ANY MACERATED SKIN. Sween Lotion (Moisturizing lotion) Discharge Instruction: Apply moisturizing lotion as directed Topical compounding topical antibiotics Jamie, [name] ([id number]) [id number] Page 6 of 8 Discharge Instruction: apply Jodie Echevaria directly to wound bed under the hydrofera blue. Home Health start. MIX ACCORDING TO THE PHARMACY INSTRUCTIONS. Primary Dressing PolyMem Non-Adhesive Dressing, 4x4 in Discharge Instruction: ******CUT SLITS INTO THE POLYMEM.***** Apply to wound bed as instructed Secondary Dressing ABD Pad, 8x10 Discharge Instruction: Apply over primary dressing as directed. Woven Gauze Sponge, Non-Sterile 4x4 in Discharge Instruction: Apply over primary dressing as directed. Zetuvit Plus 4x8 in Discharge Instruction: Or double ABD pad Apply over primary dressing as directed. Secured With Compression Wrap Kerlix Roll 4.5x3.1 (in/yd) Discharge Instruction: Apply Kerlix and Coban compression as directed. Coban Self-Adherent Wrap 4x5 (in/yd) Discharge Instruction: Apply over Kerlix as directed. unna boot FIRST LAYER **** SEE INSTRUCTIONS Discharge Instruction: APPLY FIRST LAYER UNNA BOOT AT BASES OF TOES AND JUST BELOW THE KNEE TO  HOLD COMPRESSION WRAP IN PLACE. Compression Stockings Add-Ons Electronic Signature(s) Signed: 01/01/2023 7:24:00 PM By: Shawn Stall RN, BSN Signed: 01/02/2023 4:13:22 PM By: Hunt Oris Entered By: Shawn Stall on 01/01/2023 11:09:33 -------------------------------------------------------------------------------- Wound Assessment Details Patient Name: Date of Service: Jamie [name], Jamie [name] 01/01/2023 10:15 A M Medical Record Number: [id number] Patient Account Number: [id number] Date of Birth/Sex: Treating RN: 01-05-1941 (82 y.o. Jamie [name] Primary Care Aaban Griep: Jamie [name] Other Clinician: Referring Jeyren Danowski: Treating Sahib Pella/Extender: Jamie [name], Jamie [name] in Treatment: 32 Wound Status Wound Number: 7 Primary Venous Leg Ulcer Etiology: Wound Location: Right, Posterior Lower Leg Wound Open Wounding Event: Gradually Appeared Status: Date Acquired: 01/01/2023 Comorbid Sleep Apnea, Hypertension, Peripheral Venous Disease, [name] Of Treatment: 0 History: Osteoarthritis, Neuropathy Clustered Wound: No Photos Jamie, [name] ([id number]) [id number] Page 7 of 8 Wound Measurements Length: (cm) 4.4 Width: (cm) 3.4 Depth: (cm) 0.2 Area: (cm) 11.75 Volume: (cm) 2.35 % Reduction in Area: % Reduction in Volume: Epithelialization: Medium (34-66%) Tunneling: No Undermining: No Wound Description Classification: Full Thickness Without Exposed Support Structures Wound Margin: Distinct, outline attached Exudate Amount: Medium Exudate Type: Serosanguineous Exudate Color: red, brown Foul Odor After Cleansing: No Slough/Fibrino Yes Wound Bed Granulation Amount: Small (1-33%) Exposed Structure Granulation Quality: Red, Pink Fascia Exposed: No Necrotic Amount: Large (67-100%) Fat Layer (Subcutaneous Tissue) Exposed: Yes Necrotic Quality: Adherent Slough Tendon Exposed: No Muscle Exposed: No Joint Exposed:  No Bone Exposed: No Periwound Skin Texture Texture Color No Abnormalities Noted: No No Abnormalities Noted: No Callus: No Atrophie Blanche: No Crepitus: No Cyanosis: No Excoriation: No Ecchymosis: No Induration: No Erythema: No Rash: No Hemosiderin Staining: Yes Scarring: Yes Mottled: No Pallor: No Moisture Rubor: No No Abnormalities Noted: No Dry / Scaly: No Temperature / Pain Maceration: No Temperature: No Abnormality Tenderness on Palpation: Yes Assessment Notes Separated wound from # 5 Treatment Notes  Wound #7 (Lower Leg) Wound Laterality: Right, Posterior Cleanser Soap and Water Discharge Instruction: May shower and wash wound with dial antibacterial soap and water prior to dressing change. Peri-Wound Care Zinc Oxide Ointment 30g tube Discharge Instruction: Apply Zinc Oxide to periwound with each dressing change TO ANY MACERATED SKIN. Sween Lotion (Moisturizing lotion) Discharge Instruction: Apply moisturizing lotion as directed Topical compounding topical antibiotics Discharge Instruction: apply [place] directly to wound bed under the hydrofera blue. Home Health start. MIX ACCORDING TO THE PHARMACY INSTRUCTIONS. Primary Dressing PolyMem Non-Adhesive Dressing, 4x4 in Discharge Instruction: ******CUT SLITS INTO THE POLYMEM.***** Apply to wound bed as instructed Secondary Dressing ABD Pad, 8x10 Discharge Instruction: Apply over primary dressing as directed. Woven Gauze Sponge, Non-Sterile 4x4 in Discharge Instruction: Apply over primary dressing as directed. Zetuvit Plus 4x8 in Discharge Instruction: Or double ABD pad Apply over primary dressing as directed. Jamie, [name] ([id number]) [id number] Page 8 of 8 Secured With Compression Wrap Kerlix Roll 4.5x3.1 (in/yd) Discharge Instruction: Apply Kerlix and Coban compression as directed. Coban Self-Adherent Wrap 4x5 (in/yd) Discharge Instruction: Apply over Kerlix as  directed. unna boot FIRST LAYER **** SEE INSTRUCTIONS Discharge Instruction: APPLY FIRST LAYER UNNA BOOT AT BASES OF TOES AND JUST BELOW THE KNEE TO HOLD COMPRESSION WRAP IN PLACE. Compression Stockings Add-Ons Electronic Signature(s) Signed: 01/01/2023 7:24:00 PM By: Shawn Stall RN, BSN Signed: 01/02/2023 4:13:22 PM By: Hunt Oris Entered By: Shawn Stall on 01/01/2023 11:10:34 -------------------------------------------------------------------------------- Vitals Details Patient Name: Date of Service: Jamie Brink, [name] 01/01/2023 10:15 A M Medical Record Number: [id number] Patient Account Number: [id number] Date of Birth/Sex: Treating RN: 1940-11-17 (82 y.o. Jamie [name], Jamie [name] Primary Care Chaske Paskett: Jamie [name] Other Clinician: Referring Sevastian Witczak: Treating Shela Esses/Extender: Jamie [name], Jamie [name] in Treatment: 32 Vital Signs Time Taken: 10:58 Temperature (F): 97.9 Height (in): 62 Pulse (bpm): 62 Weight (lbs): 171 Respiratory Rate (breaths/min): 18 Body Mass Index (BMI): 31.3 Blood Pressure (mmHg): 123/78 Reference Range: 80 - 120 mg / dl Electronic Signature(s) Signed: 01/02/2023 4:13:22 PM By: Hunt Oris Entered By: Hunt Oris on 01/01/2023 11:00:54

## 2023-01-08 ENCOUNTER — Encounter (HOSPITAL_BASED_OUTPATIENT_CLINIC_OR_DEPARTMENT_OTHER): Payer: Medicare HMO | Attending: Physician Assistant | Admitting: Physician Assistant

## 2023-01-08 DIAGNOSIS — I872 Venous insufficiency (chronic) (peripheral): Secondary | ICD-10-CM | POA: Diagnosis present

## 2023-01-08 DIAGNOSIS — L97812 Non-pressure chronic ulcer of other part of right lower leg with fat layer exposed: Secondary | ICD-10-CM | POA: Diagnosis present

## 2023-01-08 DIAGNOSIS — I2510 Atherosclerotic heart disease of native coronary artery without angina pectoris: Secondary | ICD-10-CM | POA: Diagnosis not present

## 2023-01-08 DIAGNOSIS — L299 Pruritus, unspecified: Secondary | ICD-10-CM | POA: Diagnosis not present

## 2023-01-09 NOTE — Progress Notes (Signed)
ALYLAH, BLAKNEY ([id number]) [id number] Page 1 of 9 Visit Report for 01/08/2023 Arrival Information Details Patient Name: Date of Service: Jamie [name], [name]. 01/08/2023 10:30 A M Medical Record Number: [id number] Patient Account Number: [id number] Date of Birth/Sex: Treating RN: 04-12-1940 (82 y.o. F) Primary Care Abygail Galeno: [name] Other Clinician: Referring Laryn Venning: Treating Hanny Elsberry/Extender: [name], [name] in Treatment: 29 Visit Information History Since Last Visit Added or deleted any medications: No Patient Arrived: Wheel Chair Any new allergies or adverse reactions: No Arrival Time: 11:07 Had a fall or experienced change in No Accompanied By: niece activities of daily living that may affect Transfer Assistance: None risk of falls: Patient Identification Verified: Yes Signs or symptoms of abuse/neglect since last visito No Secondary Verification Process Completed: Yes Hospitalized since last visit: No Patient Requires Transmission-Based Precautions: No Implantable device outside of the clinic excluding No Patient Has Alerts: No cellular tissue based products placed in the center since last visit: Has Dressing in Place as Prescribed: Yes Has Compression in Place as Prescribed: Yes Pain Present Now: Yes Electronic Signature(s) Signed: 01/09/2023 3:10:19 PM By: Karie Schwalbe RN Signed: 01/09/2023 3:52:59 PM By: Thayer Dallas Entered By: Thayer Dallas on 01/08/2023 08:09:36 -------------------------------------------------------------------------------- Clinic Level of Care Assessment Details Patient Name: Date of Service: Jamie [name], [name] 01/08/2023 10:30 A M Medical Record Number: [id number] Patient Account Number: [id number] Date of Birth/Sex: Treating RN: May 26, 1940 (82 y.o. [name] Primary Care Oberia Beaudoin: [name] Other Clinician: Referring  Taneasha Fuqua: Treating Starlyn Droge/Extender: [name], [name] in Treatment: 33 Clinic Level of Care Assessment Items TOOL 4 Quantity Score X- 1 0 Use when only an EandM is performed on FOLLOW-UP visit ASSESSMENTS - Nursing Assessment / Reassessment X- 1 10 Reassessment of Co-morbidities (includes updates in patient status) X- 1 5 Reassessment of Adherence to Treatment Plan ASSESSMENTS - Wound and Skin A ssessment / Reassessment []  - 0 Simple Wound Assessment / Reassessment - one wound X- 2 5 Complex Wound Assessment / Reassessment - multiple wounds []  - 0 Dermatologic / Skin Assessment (not related to wound area) ASSESSMENTS - Focused Assessment X- 1 5 Circumferential Edema Measurements - multi extremities Jamie [name], Jamie [name] ([id number]) [phone].pdf Page 2 of 9 []  - 0 Nutritional Assessment / Counseling / Intervention []  - 0 Lower Extremity Assessment (monofilament, tuning fork, pulses) []  - 0 Peripheral Arterial Disease Assessment (using hand held doppler) ASSESSMENTS - Ostomy and/or Continence Assessment and Care []  - 0 Incontinence Assessment and Management []  - 0 Ostomy Care Assessment and Management (repouching, etc.) PROCESS - Coordination of Care []  - 0 Simple Patient / Family Education for ongoing care X- 1 20 Complex (extensive) Patient / Family Education for ongoing care X- 1 10 Staff obtains Chiropractor, Records, T Results / Process Orders est X- 1 10 Staff telephones HHA, Nursing Homes / Clarify orders / etc []  - 0 Routine Transfer to another Facility (non-emergent condition) []  - 0 Routine Hospital Admission (non-emergent condition) []  - 0 New Admissions / Manufacturing engineer / Ordering NPWT Apligraf, etc. , []  - 0 Emergency Hospital Admission (emergent condition) []  - 0 Simple Discharge Coordination X- 1 15 Complex (extensive) Discharge Coordination PROCESS - Special Needs []  - 0 Pediatric /  Minor Patient Management []  - 0 Isolation Patient Management []  - 0 Hearing / Language / Visual special needs []  - 0 Assessment of Community assistance (transportation, D/C planning, etc.) []  - 0 Additional assistance / Altered mentation []  - 0 Support Surface(s)  Assessment (bed, cushion, seat, etc.) INTERVENTIONS - Wound Cleansing / Measurement []  - 0 Simple Wound Cleansing - one wound X- 2 5 Complex Wound Cleansing - multiple wounds X- 1 5 Wound Imaging (photographs - any number of wounds) []  - 0 Wound Tracing (instead of photographs) []  - 0 Simple Wound Measurement - one wound X- 2 5 Complex Wound Measurement - multiple wounds INTERVENTIONS - Wound Dressings []  - 0 Small Wound Dressing one or multiple wounds []  - 0 Medium Wound Dressing one or multiple wounds X- 1 20 Large Wound Dressing one or multiple wounds []  - 0 Application of Medications - topical []  - 0 Application of Medications - injection INTERVENTIONS - Miscellaneous []  - 0 External ear exam []  - 0 Specimen Collection (cultures, biopsies, blood, body fluids, etc.) []  - 0 Specimen(s) / Culture(s) sent or taken to Lab for analysis []  - 0 Patient Transfer (multiple staff / Michiel Sites Lift / Similar devices) []  - 0 Simple Staple / Suture removal (25 or less) []  - 0 Complex Staple / Suture removal (26 or more) Jamie [name], Jamie [name] ([id number]) [phone].pdf Page 3 of 9 []  - 0 Hypo / Hyperglycemic Management (close monitor of Blood Glucose) []  - 0 Ankle / Brachial Index (ABI) - do not check if billed separately X- 1 5 Vital Signs Has the patient been seen at the hospital within the last three years: Yes Total Score: 135 Level Of Care: New/Established - Level 4 Electronic Signature(s) Signed: 01/08/2023 1:07:27 PM By: Hunt Oris Entered By: Hunt Oris on 01/08/2023 09:04:19 -------------------------------------------------------------------------------- Encounter Discharge  Information Details Patient Name: Date of Service: Jamie [name], [name] 01/08/2023 10:30 A M Medical Record Number: [id number] Patient Account Number: [id number] Date of Birth/Sex: Treating RN: 03/13/1941 (82 y.o. [name] Primary Care Hodari Chuba: [name] Other Clinician: Referring Bellami Farrelly: Treating Ula Couvillon/Extender: [name], Billey Gosling in Treatment: 73 Encounter Discharge Information Items Discharge Condition: Stable Ambulatory Status: Wheelchair Discharge Destination: Home Transportation: Private Auto Accompanied By: niece Schedule Follow-up Appointment: Yes Clinical Summary of Care: Electronic Signature(s) Signed: 01/08/2023 1:07:27 PM By: Hunt Oris Entered By: Hunt Oris on 01/08/2023 09:05:29 -------------------------------------------------------------------------------- Lower Extremity Assessment Details Patient Name: Date of Service: Jamie [name]. 01/08/2023 10:30 A M Medical Record Number: [id number] Patient Account Number: [id number] Date of Birth/Sex: Treating RN: 1940/12/23 (82 y.o. F) Primary Care Israella Hubert: [name] Other Clinician: Referring Nelvin Tomb: Treating Kaycee Mcgaugh/Extender: [name], [name] in Treatment: 33 Edema Assessment Assessed: [Left: No] [Right: No] Edema: [Left: Ye] [Right: s] Calf Left: Right: Point of Measurement: From Medial Instep 36.5 cm Ankle Left: Right: Point of Measurement: From Medial Instep 25 cm Vascular Assessment Left: [[id number] Page 4 of 9Right:] Extremity colors, hair growth, and conditions: Extremity Color: [[id number].pdf Page 4 of 9Normal] Hair Growth on Extremity: [phone].pdf Page 4 of 9No] Temperature of Extremity: [phone].pdf Page 4 of 9Warm] Capillary Refill: [phone].pdf Page 4 of 9<  3 seconds] Dependent Rubor: [phone].pdf Page 4 of 9No No] Electronic Signature(s) Signed: 01/09/2023 3:10:19 PM By: Karie Schwalbe RN Signed: 01/09/2023 3:52:59 PM By: Thayer Dallas Entered By: Thayer Dallas on 01/08/2023 08:21:25 -------------------------------------------------------------------------------- Multi-Disciplinary Care Plan Details Patient Name: Date of Service: Jamie [name], [name] 01/08/2023 10:30 A M Medical Record Number: [id number] Patient Account Number: [id number] Date of Birth/Sex: Treating RN: 12-02-1940 (82 y.o. [name], [name] Primary Care Labresha Mellor: [name] Other Clinician: Referring Cailyn Houdek: Treating Orlo Brickle/Extender: [name] in Treatment: 85 Multidisciplinary Care Plan reviewed with  physician Active Inactive Pain, Acute or Chronic Nursing Diagnoses: Pain Management - Cyclic Acute (Dressing Change Related) Pain Management - Non-cyclic Acute (Procedural) Pain, acute or chronic: actual or potential Goals: Patient will verbalize adequate pain control and receive pain control interventions during procedures as needed Date Initiated: 09/11/2022 Target Resolution Date: 02/06/2023 Goal Status: Active Patient/caregiver will verbalize adequate pain control between visits Date Initiated: 09/11/2022 Target Resolution Date: 02/06/2023 Goal Status: Active Patient/caregiver will verbalize comfort level met Date Initiated: 09/11/2022 Target Resolution Date: 02/06/2023 Goal Status: Active Interventions: Complete pain assessment as per visit requirements Encourage patient to take pain medications as prescribed Provide education on pain management Provision of support: recognize patient pain, provide comfort and support as needed Reposition patient for comfort Treatment Activities: Administer pain control measures as ordered : 09/11/2022 Notes: Electronic Signature(s) Signed:  01/08/2023 1:07:27 PM By: Hunt Oris Entered By: Hunt Oris on 01/08/2023 08:47:13 [name] ([id number]) [id number] Page 5 of 9 -------------------------------------------------------------------------------- Pain Assessment Details Patient Name: Date of Service: Jamie [name], [name]. 01/08/2023 10:30 A M Medical Record Number: [id number] Patient Account Number: [id number] Date of Birth/Sex: Treating RN: 1941-01-10 (82 y.o. F) Primary Care Adelee Hannula: [name] Other Clinician: Referring Lin Glazier: Treating Ernesto Zukowski/Extender: [name], [name] in Treatment: 33 Active Problems Location of Pain Severity and Description of Pain Patient Has Paino Yes Site Locations Pain Location: Pain in Ulcers Rate the pain. Current Pain Level: 10 Pain Management and Medication Current Pain Management: Electronic Signature(s) Signed: 01/09/2023 3:10:19 PM By: Karie Schwalbe RN Signed: 01/09/2023 3:52:59 PM By: Thayer Dallas Entered By: Thayer Dallas on 01/08/2023 08:25:38 -------------------------------------------------------------------------------- Patient/Caregiver Education Details Patient Name: Date of Service: Jamie [name] 10/2/2024andnbsp10:30 A M Medical Record Number: [id number] Patient Account Number: [id number] Date of Birth/Gender: Treating RN: 1941/01/30 (82 y.o. [name] Primary Care Physician: [name] Other Clinician: Referring Physician: Treating Physician/Extender: [name] in Treatment: 38 Education Assessment Education Provided To: Patient and Caregiver Education Topics Provided Venous: Methods: Explain/Verbal Responses: Reinforcements needed Jamie [name], Jamie [name] ([id number]) [phone].pdf Page 6 of 9 Electronic Signature(s) Signed: 01/08/2023 1:07:27 PM By: Hunt Oris Entered By: Hunt Oris  on 01/08/2023 08:47:52 -------------------------------------------------------------------------------- Wound Assessment Details Patient Name: Date of Service: Jamie Mart W. 01/08/2023 10:30 A M Medical Record Number: [id number] Patient Account Number: [id number] Date of Birth/Sex: Treating RN: 1940-11-07 (82 y.o. F) Primary Care Terence Bart: [name] Other Clinician: Referring Nile Dorning: Treating Adylin Hankey/Extender: [name], [name] in Treatment: 33 Wound Status Wound Number: 5 Primary Venous Leg Ulcer Etiology: Wound Location: Right, Lateral Lower Leg Wound Open Wounding Event: Trauma Status: Date Acquired: 04/17/2022 Comorbid Sleep Apnea, Hypertension, Peripheral Venous Disease, Weeks Of Treatment: 33 History: Osteoarthritis, Neuropathy Clustered Wound: Yes Photos Wound Measurements Length: (cm) Width: (cm) Depth: (cm) Clustered Quantity: Area: (cm) Volume: (cm) 7.5 % Reduction in Area: -1.3% 4.5 % Reduction in Volume: 66.2% 0.1 Epithelialization: Medium (34-66%) 2 Tunneling: No 26.507 Undermining: No 2.651 Wound Description Classification: Full Thickness Without Exposed Supp Wound Margin: Distinct, outline attached Exudate Amount: Medium Exudate Type: Serosanguineous Exudate Color: red, brown ort Structures Foul Odor After Cleansing: No Slough/Fibrino Yes Wound Bed Granulation Amount: Large (67-100%) Exposed Structure Granulation Quality: Red Fascia Exposed: No Necrotic Amount: Small (1-33%) Fat Layer (Subcutaneous Tissue) Exposed: Yes Necrotic Quality: Adherent Slough Tendon Exposed: No Muscle Exposed: No Joint Exposed: No Bone Exposed: No Periwound Skin Texture Texture Color No Abnormalities Noted: No No Abnormalities Noted: No Callus: No Atrophie  [address]ALLIS, Jamie [name] ([id number]) [id number] Page 7 of 9 Crepitus: No Cyanosis: No Excoriation: No Ecchymosis:  No Induration: No Erythema: No Rash: No Hemosiderin Staining: Yes Scarring: Yes Mottled: No Pallor: No Moisture Rubor: No No Abnormalities Noted: No Dry / Scaly: Yes Temperature / Pain Maceration: No Temperature: No Abnormality Tenderness on Palpation: Yes Treatment Notes Wound #5 (Lower Leg) Wound Laterality: Right, Lateral Cleanser Soap and Water Discharge Instruction: May shower and wash wound with dial antibacterial soap and water prior to dressing change. Peri-Wound Care Zinc Oxide Ointment 30g tube Discharge Instruction: Apply Zinc Oxide to periwound with each dressing change TO ANY MACERATED SKIN. Sween Lotion (Moisturizing lotion) Discharge Instruction: Apply moisturizing lotion as directed Topical compounding topical antibiotics Discharge Instruction: apply Keystone directly to wound bed under the hydrofera blue. Home Health start. MIX ACCORDING TO THE PHARMACY INSTRUCTIONS. Primary Dressing PolyMem Non-Adhesive Dressing, 4x4 in Discharge Instruction: ******CUT SLITS INTO THE POLYMEM.***** Apply to wound bed as instructed Secondary Dressing ABD Pad, 8x10 Discharge Instruction: Apply over primary dressing as directed. Woven Gauze Sponge, Non-Sterile 4x4 in Discharge Instruction: Apply over primary dressing as directed. Zetuvit Plus 4x8 in Discharge Instruction: Or double ABD pad Apply over primary dressing as directed. Secured With Compression Wrap Kerlix Roll 4.5x3.1 (in/yd) Discharge Instruction: Apply Kerlix and Coban compression as directed. Coban Self-Adherent Wrap 4x5 (in/yd) Discharge Instruction: Apply over Kerlix as directed. unna boot FIRST LAYER **** SEE INSTRUCTIONS Discharge Instruction: APPLY FIRST LAYER UNNA BOOT AT BASES OF TOES AND JUST BELOW THE KNEE TO HOLD COMPRESSION WRAP IN PLACE. Compression Stockings Add-Ons Electronic Signature(s) Signed: 01/09/2023 3:10:19 PM By: Karie Schwalbe RN Signed: 01/09/2023 3:52:59 PM By: Thayer Dallas Entered By: Thayer Dallas on 01/08/2023 62:95:28 -------------------------------------------------------------------------------- Wound Assessment Details Patient Name: Date of Service: Jamie [name], [name] 01/08/2023 10:30 A M Medical Record Number: [id number] Patient Account Number: [id number] Jamie [name], Jamie [name] ([id number]) [phone].pdf Page 8 of 9 Date of Birth/Sex: Treating RN: 07/19/1940 (82 y.o. F) Primary Care Beryle Bagsby: [name] Other Clinician: Referring Deniss Wormley: Treating Zeva Leber/Extender: [name], [name] in Treatment: 33 Wound Status Wound Number: 7 Primary Venous Leg Ulcer Etiology: Wound Location: Right, Posterior Lower Leg Wound Open Wounding Event: Gradually Appeared Status: Date Acquired: 01/01/2023 Comorbid Sleep Apnea, Hypertension, Peripheral Venous Disease, Weeks Of Treatment: 1 History: Osteoarthritis, Neuropathy Clustered Wound: No Photos Wound Measurements Length: (cm) 4 Width: (cm) 4 Depth: (cm) 0.2 Area: (cm) 12.566 Volume: (cm) 2.513 % Reduction in Area: -6.9% % Reduction in Volume: -6.9% Epithelialization: Medium (34-66%) Tunneling: No Undermining: No Wound Description Classification: Full Thickness Without Exposed Support Structures Wound Margin: Distinct, outline attached Exudate Amount: Medium Exudate Type: Serosanguineous Exudate Color: red, brown Foul Odor After Cleansing: No Slough/Fibrino Yes Wound Bed Granulation Amount: Small (1-33%) Exposed Structure Granulation Quality: Red, Pink Fascia Exposed: No Necrotic Amount: Large (67-100%) Fat Layer (Subcutaneous Tissue) Exposed: Yes Necrotic Quality: Adherent Slough Tendon Exposed: No Muscle Exposed: No Joint Exposed: No Bone Exposed: No Periwound Skin Texture Texture Color No Abnormalities Noted: No No Abnormalities Noted: No Callus: No Atrophie Blanche: No Crepitus: No Cyanosis:  No Excoriation: No Ecchymosis: No Induration: No Erythema: No Rash: No Hemosiderin Staining: Yes Scarring: Yes Mottled: No Pallor: No Moisture Rubor: No No Abnormalities Noted: No Dry / Scaly: No Temperature / Pain Maceration: No Temperature: No Abnormality Tenderness on Palpation: Yes Treatment Notes Wound #7 (Lower Leg) Wound Laterality: Right, Posterior Cleanser Soap and Water Discharge Instruction: May shower and wash wound with dial antibacterial soap  and water prior to dressing change. Jamie [name], Jamie [name] ([id number]) [id number] Page 9 of 9 Peri-Wound Care Zinc Oxide Ointment 30g tube Discharge Instruction: Apply Zinc Oxide to periwound with each dressing change TO ANY MACERATED SKIN. Sween Lotion (Moisturizing lotion) Discharge Instruction: Apply moisturizing lotion as directed Topical compounding topical antibiotics Discharge Instruction: apply Keystone directly to wound bed under the hydrofera blue. Home Health start. MIX ACCORDING TO THE PHARMACY INSTRUCTIONS. Primary Dressing PolyMem Non-Adhesive Dressing, 4x4 in Discharge Instruction: ******CUT SLITS INTO THE POLYMEM.***** Apply to wound bed as instructed Secondary Dressing ABD Pad, 8x10 Discharge Instruction: Apply over primary dressing as directed. Woven Gauze Sponge, Non-Sterile 4x4 in Discharge Instruction: Apply over primary dressing as directed. Zetuvit Plus 4x8 in Discharge Instruction: Or double ABD pad Apply over primary dressing as directed. Secured With Compression Wrap Kerlix Roll 4.5x3.1 (in/yd) Discharge Instruction: Apply Kerlix and Coban compression as directed. Coban Self-Adherent Wrap 4x5 (in/yd) Discharge Instruction: Apply over Kerlix as directed. unna boot FIRST LAYER **** SEE INSTRUCTIONS Discharge Instruction: APPLY FIRST LAYER UNNA BOOT AT BASES OF TOES AND JUST BELOW THE KNEE TO HOLD COMPRESSION WRAP IN PLACE. Compression Stockings Add-Ons Electronic  Signature(s) Signed: 01/09/2023 3:10:19 PM By: Karie Schwalbe RN Signed: 01/09/2023 3:52:59 PM By: Thayer Dallas Entered By: Thayer Dallas on 01/08/2023 08:30:15 -------------------------------------------------------------------------------- Vitals Details Patient Name: Date of Service: Jamie Brink, [name] 01/08/2023 10:30 A M Medical Record Number: [id number] Patient Account Number: [id number] Date of Birth/Sex: Treating RN: 10-23-1940 (82 y.o. F) Primary Care Chaquetta Schlottman: [name] Other Clinician: Referring Marty Uy: Treating Eneida Evers/Extender: [name], [name] in Treatment: 33 Vital Signs Time Taken: 11:25 Temperature (F): 98.3 Height (in): 62 Pulse (bpm): 46 Weight (lbs): 171 Respiratory Rate (breaths/min): 18 Body Mass Index (BMI): 31.3 Blood Pressure (mmHg): 137/73 Reference Range: 80 - 120 mg / dl Electronic Signature(s) Signed: 01/09/2023 3:10:19 PM By: Karie Schwalbe RN Signed: 01/09/2023 3:52:59 PM By: Thayer Dallas Entered By: Thayer Dallas on 01/08/2023 08:26:05

## 2023-01-09 NOTE — Progress Notes (Signed)
BAILIE, CHRISTENBURY ([id number]) [id number] Page 1 of 11 Visit Report for 01/08/2023 Chief Complaint Document Details Patient Name: Date of Service: Jamie [name], [name]. 01/08/2023 10:30 A M Medical Record Number: [id number] Patient Account Number: [id number] Date of Birth/Sex: Treating RN: 01/20/1941 (82 y.o. F) Primary Care Provider: Delorise [name] Other Clinician: Referring Provider: Treating Provider/Extender: Jamie [name], Jamie [name] in Treatment: 58 Information Obtained from: Patient Chief Complaint Right LE Ulcer Electronic Signature(s) Signed: 01/08/2023 11:43:45 AM By: Jamie Derry PA-C Entered By: Jamie [name] on 01/08/2023 08:43:45 -------------------------------------------------------------------------------- HPI Details Patient Name: Date of Service: Jamie [name], [name] 01/08/2023 10:30 A M Medical Record Number: [id number] Patient Account Number: [id number] Date of Birth/Sex: Treating RN: Nov 11, 1940 (82 y.o. F) Primary Care Provider: Delorise [name] Other Clinician: Referring Provider: Treating Provider/Extender: Jamie [name], Jamie [name] in Treatment: 12 History of Present Illness HPI Description: ADMISSION 06/30/2020 Jamie [name] is an 82 year old woman who lives in [place]. She is here with her niece for review of wounds on the left medial lower leg and ankle. These have apparently been present for over a year and she followed with Jamie [name] at the [facility] in [place] for quite a period of time although it looks as though there was a initial consult wound from Jamie [name] on February 18 presumably there was therefore hiatus. At that point the wounds were described as being there for 3 months. She also tells me she was at the wound care center in [place] for a period of time with this. 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 She had venous studies that were negative for DVT ABIs on the right were 1.01 on the left 1.06. She has . had previous applications of puraply, compression which she does not tolerate very well. She has had several rounds of oral antibiotic therapy. She complains of unrelenting pain and she is seeing Jamie [name] of pain management apparently was on oxycodone but that did not help. She also had a skin biopsy done by Jamie [name] although we do not have that result. She does not appear to have an arterial issue. I am not completely clear what she has been putting on the wounds lately. 3/31; this patient has a particularly nasty set of wounds on the left medial ankle in the middle of what looks to be hemosiderin deposition secondary to chronic venous insufficiency. She has a lot of pain followed by pain management. Jamie [name] apparently did a biopsy of something on the left leg last fall what I would like to get this result. She has a history of MRSA treatment. I do not believe she had reflux studies but she did have DVT rule out studies. Previous ABIs have not suggested arterial insufficiency 4/7; difficult wounds on the left medial ankle probably chronic venous insufficiency. With considerable effort on behalf of our case manager we were able to finally to speak to somebody at the hospital in [place] who indicated that no biopsy of this area have been done even though the patient describes this in some detail and is even able to point out where she thinks the biopsy was done. We have been using Sorbact. The PCR culture I did showed polymicrobial identification with Pseudomonas, staph aureus, Peptostreptococcus. All of this and low titers. Resistance genes identified were MRSA, staph virulence gene and tetracycline. We are going to send this to [facility] for a topical antibiotic which is something  that we have had good success with recently in large venous ulcers with a lot of purulent  drainage. There would not be an easy oral alternative here possibly line escalated and ciprofloxacin if we need to use systemic antibiotic 4/15; difficult area on the left medial ankle. Most likely chronic venous insufficiency. I think she will probably need venous reflux study I think she had DVT rule outs but not venous reflux studies. We have not yet obtained the topical antibiotics. She has home health changing the dressing we have been using Sorbact for adherent fibrinous debris on the surface. Very difficult to remove Jamie [name] ([id number]) [id number] Page 2 of 11 4/22; patient presents for 1 week follow-up. She has been using sore back under compression wraps and these are changed 3 times a week with home health. She also had Keystone antibiotics sent to her house and brought them in today. She has no complaints or issues today. 5/2; patient is here for follow-up. She has been using Sorbact under compression. Very painful wound. She has been using Keystone antibiotics. Not much improvement although the more medial part of the wound has cleaned up nicely and the larger part of the wound about 50% slough covered. Part of the issue here is that she had stays at both [facility] wound care center, [facility] wound care center and now [place]. Not sure if she has had venous reflux studies. As far as we are able to tell she did not have a biopsy. My notes state that she did not have venous reflux studies just DVT rule outs. 5/16; patient goes for venous reflux studies this afternoon. She says that wound was biopsied which sounds like punch biopsies by Dr. Lynden Ang we do not have these results. We are using Sorbact. Very difficult wound to debride 5/23; patient presents for 1 week follow-up. She reports tolerating the wraps well with sorbact underneath. She had ABIs and venous reflux studies done. She has no issues or complaints today. She denies acute signs of  infection. 6/6; I have reviewed the patient's vascular studies. Wounds are on the left medial and posterior calf. She had significant reflux in the greater saphenous vein in the in the mid thigh, distal thigh knee and the small saphenous vein in the popliteal fossa. The vein diameters do not look too impressive though. She is going to see the vascular surgeon on Wednesday. She also had venous reflux in the right common femoral vein. She did not have any evidence of a DVT or SVT I am wondering whether there is an ablation procedure that would benefit her in the greater saphenous vein on the left. . She tells [place] that home health put the dressing on too tight and she took off 1 layer. The swelling in her left leg is a little worse as a result of this. Not much change in the wound measurements so the surface of the wound looks better Her arterial studies showed an ABI on the right of 1.14 with a triphasic waveform and a great toe pressure of 0.89. On the left her ABIs were noncompressible at 1.34 but with triphasic waveforms and a TBI of 0.97. Her greater toe pressure was 122. There was no evidence of significant bilateral arterial disease 6/20 patient went to see Dr. Durwin Nora. He did not think she had significant arterial disease. In terms of her venous duplex on the right side there was no evidence of a DVT or SVT there was deep venous reflux involving the common  femoral vein no superficial vein reflux on the left side there was no DVT or SVT there was no deep vein graft reflux there was some reflux in the greater saphenous vein from the mid thigh to the knee but the vein here was not dilated. He thought these were venous wounds he prescribed a compression pump but I am not sure who we ordered it from The patient has been approved for Apligraf. Still using silver collagen this week 7/5; Apligraf #1 7/19 Apligraf #2. Decent improvement in the condition of the wound bed. Epithelialization distal 8/2  Apligraf #3. No issues or complaints. Denies signs of infection. 8/17 Apligraf #4. No issues or concerns. Some complaints of pruritus and the rash. Our intake nurse brought up the fact that she had previously indicated possible cotton layer sensitivity we will therefore use kerlix in the bottom layer of the compression 8/30; the patient comes in with the area on her left medial leg just about healed. There is a superficial area more towards the tibia and a smaller open area distally everything else is epithelialized. I do not think she requires another Apligraf 9/13; left medial leg is healed. 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 Her edema control is good Readmisstion: 05-22-2022 upon evaluation today patient presents for initial inspection here in our clinic concerning issues that she has been having with a wound of the right lateral lower extremity. This is an area of a previous skin graft she tells me. With that being said she unfortunately had a scrape on this that occurred around 10 January. Since that time she has noted that this has just continued to get bigger and bigger in her niece who is present with her today actually states that 2 [name] ago when she saw that it was significantly smaller than what she sees currently. Obviously this is of utmost concern as we do not want this to continue to get larger when arrested and get moving in the right direction. Fortunately there does not appear to be any signs of systemic infection though locally I think we probably do have some infection present. I would obtain a culture to see what we have going on here and then we will subsequently see where things go going forward. Fortunately I think that she is in the right place at this point being at the wound center we can definitely do something to try to get this moving in the right direction. Patient does have a history of chronic venous  insufficiency as well as coronary artery disease. In the past she has done well with compression wraps on the start with a 3 layer wrap I think she is probably can end up going to a 4-layer at some point but we will see how things do over the next week. She has been using wound cleanser along with she tells me a zinc and topical but again I am not sure exactly what that was. 05-29-2022 upon evaluation today patient appears to be doing well currently in regard to her wound. This actually is showing signs of improvement I am happy in that regard unfortunately her left leg has an area on the shin that has opened. This is the region where one of the areas at least we have previously taken care of. Nonetheless this is small hoping we get it under control before things worsen significantly. 06-05-2022 upon evaluation today patient appears to be doing well currently in regard  to her wounds. The actually seem to be fairly clean she is having still quite a bit of problem with pain at this point she would like to not have debridement today for all possible. With that being said I do believe that we are making some good progress I think the [facility] is doing a decent job here. 3/6; patient has had 3/6; patient with known severe chronic venous insufficiency. She apparently had a traumatic wound at home and has a reasonably substantial wound on the right lateral lower leg and a smaller one on the left anterior lower leg as well. We have been using Hydrofera Blue and Unna boots 3/13; patient presents for follow-up. We have been using Hydrofera Blue under Coflex to the legs bilaterally. She has no issues or complaints today. 06-26-2022 upon evaluation today patient appears to be doing well currently in regard to her wound. This is measuring a little bit larger however. Fortunately I do not see any signs of infection this is on the right side. Fortunately the left side is actually completely healed which is great  news. 07-03-2022 patient's wound unfortunately is continuing to show signs of being worse. Her compression which was the Tubigrip that I thought should be able to pull up actually slipped down and she was not able to pull that back up. With that being said that means that unfortunately her wound has continued to deteriorate since I last saw her. This is definitely not the direction that we are looking for. I discussed with her that I do believe we need to go ahead and see about getting her started on antibiotics and I subsequently would also like to go ahead and see about getting things moving forward with regard to a wound culture and making a change up her dressings as well but this can be changed more frequently than just once a week. 07-10-2022 upon evaluation today patient actually appears to be doing much better. I do believe that the antibiotics have been beneficial for her. Fortunately I do not see any signs of active infection locally nor systemically which is great news. No fevers, chills, nausea, vomiting, or diarrhea. 07-17-2022 upon evaluation today patient appears to be doing well currently in regard to her wound which is actually showing signs of improvement this is slow but nonetheless prevalent that we are seeing good improvement here. Fortunately I do not see any signs of active infection locally nor systemically which is great news. 07-24-2022 upon evaluation today patient appears to be doing well currently in regard to her wound although the wrap that we had on has been slipping down and she really does not have anybody to help her with this at home health is not going to be coming out we could not get anybody that would actually be able to do the dressing changes. Fortunately I do not see any signs of active infection locally nor systemically which is great news. 08-07-22 poorly in regard to her leg compared to what it was previous. Fortunately there does not appear to be any signs of  active infection locally nor systemically which is great news. No fevers, chills, nausea, vomiting, or diarrhea. With that being said it does appear that the patient may have some cellulitis in the leg which is not good. 08-14-2022 upon evaluation today patient appears to be doing somewhat better in regard to her wounds she still is having quite a bit of pain we are still not where we want to be as  far as healing is concerned completely. Fortunately I do not see any evidence of active infection locally nor systemically which is great news and I am very pleased in that regard. Jamie [name], Jamie [name] ([id number]) [id number] Page 3 of 11 08-21-23 upon evaluation today patient appears to be doing poorly currently in regard to her wound. She has been tolerating the dressing changes without complication. Fortunately there does not appear to be any signs of active infection locally nor systemically at this time. 08-28-2022 upon evaluation today patient appears to be doing poorly in regard to her wound this was not wrapped appropriately home health did not go up high enough on the wrap. This has caused some issues and I discussed that with the patient today. I do believe that she is going to require aggressive wrapping and treatment which she is getting the [facility] topical antibiotics today they can start using that at the next wrap on Friday. In the meantime they need to make sure that the rapid and appropriately we wrote very specific orders today. 09-04-2022 upon evaluation today patient appears to be doing well currently in regard to her wound which I think is making progress is still hurting her quite significantly. Fortunately I do not see any signs of active infection locally or systemically which is great news. No fevers, chills, nausea, vomiting, or diarrhea. 09-11-2022 upon evaluation today patient's wound actually is showing signs of being a little bit smaller and looking a  little bit better she still has a lot going on here however. Fortunately I do not see any evidence of active infection Worsening locally nor systemically which is great news. With that being said worsening still continue to use the topical Keystone antibiotics. 09-18-2022 upon evaluation today patient actually showing some signs of improvement. I am actually very pleased with where we stand compared to where we have been. I think that she is making good headway here. She is still having quite a bit of pain but it seems to be lessening compared to previous. 09-25-2022 upon evaluation patient's wound actually showing signs slowly but surely of improvement. Fortunately I do not see any signs of active infection at this time systemically and locally I feel like this is dramatically improved. She is doing well with the [facility] topical antibiotics. 10-02-2022 upon evaluation today patient appears to be doing better in regard to her wound overall. I am very pleased with where things stand from a visual standpoint I do not see any signs of active infection and in general I think that we are moving in the right direction here. 10-09-2022 upon evaluation today patient appears to be doing well currently in regard to her wound. She has been tolerating the dressing changes without complication. Fortunately I do not see any signs of active infection at this time which is great news. 10-16-2022 upon evaluation today patient continues to have quite a bit of pain in regard to her right leg. We have been attempting to debride little by little as we could but again was pretty limited by the fact that she is having significant discomfort. We do not actually have any signs of active infection going on at this point that the [facility] topical antibiotics have been helping quite readily. I been attempting to do as much debridement as I can but if she were to have pain medication this would actually help and in the past when she  did it was much more tolerable for her as far as taking the edge off.  Right now she has been without as she is in transition from her previous pain management physician to someone new to manage this. 10-23-2022 upon evaluation patient appears to be doing excellent in regard to her wound. She has been tolerating the dressing changes without complication. Fortunately I do not see any evidence of active infection locally nor systemically which is great news. No fevers, chills, nausea, vomiting, or diarrhea. 7/24; patient with a wound secondary to chronic venous insufficiency on the right lateral lower leg. We have been using Keystone antibiotic Hydrofera Blue under kerlix Coban. She complains of a lot of pain after last week's debridement otherwise things appear to be improved per discussion with our intake nurse. 11-06-2022 upon evaluation today patient appears to be doing excellent at this point in regard to her wound. She has been tolerating the dressing changes without complication and to be honest she is making excellent progress towards complete closure. I am actually very pleased with where we stand I think that she is doing excellent as far as the overall appearance of the wound is concerned as well. She is going require some debridement however. 11-20-2022 upon evaluation today patient appears to be doing well currently in regard to her wound. She has been tolerating the dressing changes without complication. Fortunately there does not appear to be any signs of active infection locally or systemically which is great news and in general I do believe that we are moving in the right direction here. No fevers, chills, nausea, vomiting, or diarrhea. 11-27-2022 upon evaluation today patient appears to be doing a little better in regards to the overall appearance of her wound but unfortunately she is having increased pain. The collagen seems to be getting very dry and stuck to the wound bed which is causing  her some issues here. Fortunately I do not see any signs of active infection locally nor systemically at this time. Fortunately I think that her wound is better but unfortunately her pain has not. For that reason I am going to avoid any debridement today since the patient is very upset about the pain and how bad this is hurting at this point. I think we can have to try something a little bit different I am thinking PolyMem may be a good option. 8/28; this patient has difficult venous wounds on the right lateral lower leg and ankle in the setting of chronic venous insufficiency. We have been using polymen under compression with kerlix Coban. 12-11-2022 upon evaluation today patient appears to be doing well currently in regard to her wound. She has been tolerating the dressing changes without complication and actually appears to be doing much better this week compared to when I saw her 2 [name] ago. The wound is measuring significantly smaller. We are using PolyMem along with Kerlix and Coban. 12-18-2022 upon evaluation today patient appears to be doing well currently in regard to her wound. This is actually showing signs of improvement is measuring a little bit smaller and looking better. Fortunately I do not see any evidence of worsening overall and I believe that we will making good headway with the PolyMem and the [facility] topical antibiotics. 12-25-2022 upon evaluation today patient appears to be doing well currently in regard to her wound. She has been tolerating the dressing changes without complication. Fortunately I do not see any evidence of infection locally or systemically which is great news and in general I do believe that we will make an excellent headway towards complete closure also excellent  news. 01-01-2023 upon evaluation patient's wound actually showing signs of improvement the wrap was actually put on properly this week and this is good news. Overall I am extremely happy with where  things stand and how this appears today I do not see any signs of worsening overall and I believe that the patient is making excellent headway towards complete closure which is great news. 01/08/2023 upon evaluation today patient appears to be doing well currently in regard to her leg. She is actually draining much less unfortunately home health is just not doing very well getting the supplies necessary in order to continue to take care of the patient. They are now telling me they cannot get the PolyMem which is something that has been doing really well for her. That really does not make sense to me you came to get PolyMem for a hospice patient. Nonetheless either way I think we may need to just discontinue treatment with home health and just manage the patient here at the clinic. Electronic Signature(s) Signed: 01/08/2023 5:27:55 PM By: Jamie Derry PA-C Entered By: Jamie [name] on 01/08/2023 14:27:55 Jamie [name] ([id number]) [id number] Page 4 of 11 -------------------------------------------------------------------------------- Physical Exam Details Patient Name: Date of Service: Jamie [name], [name]. 01/08/2023 10:30 A M Medical Record Number: [id number] Patient Account Number: [id number] Date of Birth/Sex: Treating RN: Mar 31, 1941 (82 y.o. F) Primary Care Provider: Delorise [name] Other Clinician: Referring Provider: Treating Provider/Extender: Jamie [name], Jamie [name] in Treatment: 47 Constitutional Well-nourished and well-hydrated in no acute distress. Respiratory normal breathing without difficulty. Psychiatric this patient is able to make decisions and demonstrates good insight into disease process. Alert and Oriented x 3. pleasant and cooperative. Notes Upon inspection patient's wound bed actually looked pretty clean she was having a lot of pain today so I did opt not to do any debridement since there was not a  significant amount of buildup of slough or biofilm at this point she was very pleased with this will see where things stand next week I may need to perform some debridement next week. Electronic Signature(s) Signed: 01/08/2023 5:28:11 PM By: Jamie Derry PA-C Entered By: Jamie [name] on 01/08/2023 14:28:11 -------------------------------------------------------------------------------- Physician Orders Details Patient Name: Date of Service: Jamie [name], [name] 01/08/2023 10:30 A M Medical Record Number: [id number] Patient Account Number: [id number] Date of Birth/Sex: Treating RN: December 18, 1940 (82 y.o. [name] Primary Care Provider: Delorise [name] Other Clinician: Referring Provider: Treating Provider/Extender: Jamie [name], Jamie [name] in Treatment: 83 The following information was scribed by: Hunt Oris The information was scribed for: Lenda Kelp Verbal / Phone Orders: No Diagnosis Coding Follow-up Appointments ppointment in 1 week. Leonard Schwartz Wednesday Room 8 01/15/23 at 10:15am Return A ppointment in 2 [name]. Leonard Schwartz Wednesday Room 8 Return A Return appointment in 3 [name]. Leonard Schwartz Wednesday Other: - Discharge home health Anesthetic Wound #5 Right,Lateral Lower Leg (In clinic) Topical Lidocaine 4% applied to wound bed Bathing/ Shower/ Hygiene May shower and wash wound with soap and water. - with dressing changes. Edema Control - Lymphedema / SCD / Other Right Lower Extremity Elevate legs to the level of the heart or above for 30 minutes daily and/or when sitting for 3-4 times a day throughout the day. Avoid standing for long periods of time. Exercise regularly [facility] [place] ([id number]) [id number] Page 5 of 11 Wound #5 Right,Lateral Lower Leg Discontinue home health for wound care. Other Home Health Orders/Instructions: - [facility]  [place], [place] fax #  [phone]. Wound Treatment Wound #5 - Lower Leg Wound Laterality: Right, Lateral Cleanser: Soap and Water 1 x Per Week/30 Days Discharge Instructions: May shower and wash wound with dial antibacterial soap and water prior to dressing change. Peri-Wound Care: Zinc Oxide Ointment 30g tube 1 x Per Week/30 Days Discharge Instructions: Apply Zinc Oxide to periwound with each dressing change TO ANY MACERATED SKIN. Peri-Wound Care: Sween Lotion (Moisturizing lotion) 1 x Per Week/30 Days Discharge Instructions: Apply moisturizing lotion as directed Topical: compounding topical antibiotics 1 x Per Week/30 Days Discharge Instructions: apply Keystone directly to wound bed under the hydrofera blue. Home Health start. MIX ACCORDING TO THE PHARMACY INSTRUCTIONS. Prim Dressing: PolyMem Non-Adhesive Dressing, 4x4 in 1 x Per Week/30 Days ary Discharge Instructions: ******CUT SLITS INTO THE POLYMEM.***** Apply to wound bed as instructed Secondary Dressing: ABD Pad, 8x10 1 x Per Week/30 Days Discharge Instructions: Apply over primary dressing as directed. Secondary Dressing: Woven Gauze Sponge, Non-Sterile 4x4 in 1 x Per Week/30 Days Discharge Instructions: Apply over primary dressing as directed. Secondary Dressing: Zetuvit Plus 4x8 in 1 x Per Week/30 Days Discharge Instructions: Or double ABD pad Apply over primary dressing as directed. Compression Wrap: Kerlix Roll 4.5x3.1 (in/yd) 1 x Per Week/30 Days Discharge Instructions: Apply Kerlix and Coban compression as directed. Compression Wrap: Coban Self-Adherent Wrap 4x5 (in/yd) 1 x Per Week/30 Days Discharge Instructions: Apply over Kerlix as directed. Compression Wrap: unna boot FIRST LAYER **** SEE INSTRUCTIONS 1 x Per Week/30 Days Discharge Instructions: APPLY FIRST LAYER UNNA BOOT AT BASES OF TOES AND JUST BELOW THE KNEE TO HOLD COMPRESSION WRAP IN PLACE. Wound #7 - Lower Leg Wound Laterality: Right, Posterior Cleanser: Soap and Water 1 x Per  Week/30 Days Discharge Instructions: May shower and wash wound with dial antibacterial soap and water prior to dressing change. Peri-Wound Care: Zinc Oxide Ointment 30g tube 1 x Per Week/30 Days Discharge Instructions: Apply Zinc Oxide to periwound with each dressing change TO ANY MACERATED SKIN. Peri-Wound Care: Sween Lotion (Moisturizing lotion) 1 x Per Week/30 Days Discharge Instructions: Apply moisturizing lotion as directed Topical: compounding topical antibiotics 1 x Per Week/30 Days Discharge Instructions: apply Keystone directly to wound bed under the hydrofera blue. Home Health start. MIX ACCORDING TO THE PHARMACY INSTRUCTIONS. Prim Dressing: PolyMem Non-Adhesive Dressing, 4x4 in 1 x Per Week/30 Days ary Discharge Instructions: ******CUT SLITS INTO THE POLYMEM.***** Apply to wound bed as instructed Secondary Dressing: ABD Pad, 8x10 1 x Per Week/30 Days Discharge Instructions: Apply over primary dressing as directed. Secondary Dressing: Woven Gauze Sponge, Non-Sterile 4x4 in 1 x Per Week/30 Days Discharge Instructions: Apply over primary dressing as directed. Secondary Dressing: Zetuvit Plus 4x8 in 1 x Per Week/30 Days Discharge Instructions: Or double ABD pad Apply over primary dressing as directed. Compression Wrap: Kerlix Roll 4.5x3.1 (in/yd) 1 x Per Week/30 Days Discharge Instructions: Apply Kerlix and Coban compression as directed. Compression Wrap: Coban Self-Adherent Wrap 4x5 (in/yd) 1 x Per Week/30 Days Discharge Instructions: Apply over Kerlix as directed. Compression Wrap: unna boot FIRST LAYER **** SEE INSTRUCTIONS 1 x Per Week/30 Days Discharge Instructions: APPLY FIRST LAYER UNNA BOOT AT BASES OF TOES AND JUST BELOW THE KNEE TO HOLD COMPRESSION WRAP IN PLACE. Jamie [name], Jamie [name] ([id number]) [id number] Page 6 of 11 Electronic Signature(s) Signed: 01/08/2023 1:07:27 PM By: Hunt Oris Signed: 01/08/2023 5:35:47 PM By: Jamie Derry  PA-C Entered By: Hunt Oris on 01/08/2023 08:50:16 -------------------------------------------------------------------------------- Problem List Details Patient Name: Date of Service: Jamie Brink,  MA [name] 01/08/2023 10:30 A M Medical Record Number: [id number] Patient Account Number: [id number] Date of Birth/Sex: Treating RN: December 11, 1940 (82 y.o. 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 01/08/2023 10:30 A M Medical Record Number: [id number] Patient Account Number: [id number] Date of Birth/Sex: Treating RN: [birth year] (82 y.o. F) Primary Care Provider: Delorise [name] Other Clinician: Referring Provider: Treating Provider/Extender: Jamie [name], Jamie [name] in Treatment: 66 Subjective Chief Complaint Information obtained from Patient Right LE Ulcer Jamie [name], Jamie [name] ([id number]) [id number] Page 7 of 11 History of Present Illness (HPI) ADMISSION 06/30/2020 Mrs. Cislo is an 82 year old woman who lives in [place]. She is here with her niece for review of  wounds on the left medial lower leg and ankle. These have apparently been present for over a year and she followed with Jamie [name] at the [facility] in [place] for quite a period of time although it looks as though there was a initial consult wound from Jamie [name] on February 18 presumably there was therefore hiatus. At that point the wounds were described as being there for 3 months. She also tells me she was at the wound care center in [place] for a period of time with this. 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 She had venous studies that were negative for DVT ABIs on the right were 1.01 on the left 1.06. She has . had previous applications of puraply, compression which she does not tolerate very well. She has had several rounds of oral antibiotic therapy. She complains of unrelenting pain and she is seeing Jamie [name] of pain management apparently was on oxycodone but that did not help. She also had a skin biopsy done by Jamie [name] although we do not have that result. She does not appear to have an arterial issue. I am not completely clear what she has been putting on the wounds lately. 3/31; this patient has a particularly nasty set of wounds on the left medial ankle in the middle of what looks to be hemosiderin deposition secondary to chronic venous insufficiency. She has a lot of pain followed by pain management. Jamie [name] apparently did a biopsy of something on the left leg last fall what I would like to get this result. She has a history of MRSA treatment. I do not believe she had reflux studies but she did have DVT rule out studies. Previous ABIs have not suggested arterial insufficiency 4/7; difficult wounds on the left medial ankle probably chronic venous insufficiency. With considerable effort on behalf of our case manager we were able to finally to speak to somebody at the hospital in [place] who indicated that no biopsy of this area have  been done even though the patient describes this in some detail and is even able to point out where she thinks the biopsy was done.  We have been using Sorbact. The PCR culture I did showed polymicrobial identification with Pseudomonas, staph aureus, Peptostreptococcus. All of this and low titers. Resistance genes identified were MRSA, staph virulence gene and tetracycline. We are going to send this to [facility] for a topical antibiotic which is something that we have had good success with recently in large venous ulcers with a lot of purulent drainage. There would not be an easy oral alternative here possibly line escalated and ciprofloxacin if we need to use systemic antibiotic 4/15; difficult area on the left medial ankle. Most likely chronic venous insufficiency. I think she will probably need venous reflux study I think she had DVT rule outs but not venous reflux studies. We have not yet obtained the topical antibiotics. She has home health changing the dressing we have been using Sorbact for adherent fibrinous debris on the surface. Very difficult to remove 4/22; patient presents for 1 week follow-up. She has been using sore back under compression wraps and these are changed 3 times a week with home health. She also had Keystone antibiotics sent to her house and brought them in today. She has no complaints or issues today. 5/2; patient is here for follow-up. She has been using Sorbact under compression. Very painful wound. She has been using Keystone antibiotics. Not much improvement although the more medial part of the wound has cleaned up nicely and the larger part of the wound about 50% slough covered. Part of the issue here is that she had stays at both [facility] wound care center, [facility] wound care center and now [place]. Not sure if she has had venous reflux studies. As far as we are able to tell she did not have a biopsy. My notes state that she did not have venous reflux studies just DVT rule  outs. 5/16; patient goes for venous reflux studies this afternoon. She says that wound was biopsied which sounds like punch biopsies by Dr. Lynden Ang we do not have these results. We are using Sorbact. Very difficult wound to debride 5/23; patient presents for 1 week follow-up. She reports tolerating the wraps well with sorbact underneath. She had ABIs and venous reflux studies done. She has no issues or complaints today. She denies acute signs of infection. 6/6; I have reviewed the patient's vascular studies. Wounds are on the left medial and posterior calf. She had significant reflux in the greater saphenous vein in the in the mid thigh, distal thigh knee and the small saphenous vein in the popliteal fossa. The vein diameters do not look too impressive though. She is going to see the vascular surgeon on Wednesday. She also had venous reflux in the right common femoral vein. She did not have any evidence of a DVT or SVT I am wondering whether there is an ablation procedure that would benefit her in the greater saphenous vein on the left. . She tells [place] that home health put the dressing on too tight and she took off 1 layer. The swelling in her left leg is a little worse as a result of this. Not much change in the wound measurements so the surface of the wound looks better Her arterial studies showed an ABI on the right of 1.14 with a triphasic waveform and a great toe pressure of 0.89. On the left her ABIs were noncompressible at 1.34 but with triphasic waveforms and a TBI of 0.97. Her greater toe pressure was 122. There was no evidence of significant bilateral arterial disease 6/20 patient went to  see Dr. Durwin Nora. He did not think she had significant arterial disease. In terms of her venous duplex on the right side there was no evidence of a DVT or SVT there was deep venous reflux involving the common femoral vein no superficial vein reflux on the left side there was no DVT or SVT there was no deep vein  graft reflux there was some reflux in the greater saphenous vein from the mid thigh to the knee but the vein here was not dilated. He thought these were venous wounds he prescribed a compression pump but I am not sure who we ordered it from The patient has been approved for Apligraf. Still using silver collagen this week 7/5; Apligraf #1 7/19 Apligraf #2. Decent improvement in the condition of the wound bed. Epithelialization distal 8/2 Apligraf #3. No issues or complaints. Denies signs of infection. 8/17 Apligraf #4. No issues or concerns. Some complaints of pruritus and the rash. Our intake nurse brought up the fact that she had previously indicated possible cotton layer sensitivity we will therefore use kerlix in the bottom layer of the compression 8/30; the patient comes in with the area on her left medial leg just about healed. There is a superficial area more towards the tibia and a smaller open area distally everything else is epithelialized. I do not think she requires another Apligraf 9/13; left medial leg is healed. 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 Her edema control is good Readmisstion: 05-22-2022 upon evaluation today patient presents for initial inspection here in our clinic concerning issues that she has been having with a wound of the right lateral lower extremity. This is an area of a previous skin graft she tells me. With that being said she unfortunately had a scrape on this that occurred around 10 January. Since that time she has noted that this has just continued to get bigger and bigger in her niece who is present with her today actually states that 2 [name] ago when she saw that it was significantly smaller than what she sees currently. Obviously this is of utmost concern as we do not want this to continue to get larger when arrested and get moving in the right direction. Fortunately there does not appear to be  any signs of systemic infection though locally I think we probably do have some infection present. I would obtain a culture to see what we have going on here and then we will subsequently see where things go going forward. Fortunately I think that she is in the right place at this point being at the wound center we can definitely do something to try to get this moving in the right direction. Patient does have a history of chronic venous insufficiency as well as coronary artery disease. In the past she has done well with compression wraps on the start with a 3 layer wrap I think she is probably can end up going to a 4-layer at some point but we will see how things do over the next week. She has been using wound cleanser along with she tells me a zinc and topical but again I am not sure exactly what that was. 05-29-2022 upon evaluation today patient appears to be doing well currently in regard to her wound. This actually is showing signs of improvement I am happy in that regard unfortunately her left leg has an area on the shin that has opened. This is the region where  one of the areas at least we have previously taken care of. Nonetheless this is small hoping we get it under control before things worsen significantly. 06-05-2022 upon evaluation today patient appears to be doing well currently in regard to her wounds. The actually seem to be fairly clean she is having still quite a bit of problem with pain at this point she would like to not have debridement today for all possible. With that being said I do believe that we are making some good progress I think the [facility] is doing a decent job here. Jamie [name], Jamie [name] ([id number]) [id number] Page 8 of 11 3/6; patient has had 3/6; patient with known severe chronic venous insufficiency. She apparently had a traumatic wound at home and has a reasonably substantial wound on the right lateral lower leg and a smaller one on  the left anterior lower leg as well. We have been using Hydrofera Blue and Unna boots 3/13; patient presents for follow-up. We have been using Hydrofera Blue under Coflex to the legs bilaterally. She has no issues or complaints today. 06-26-2022 upon evaluation today patient appears to be doing well currently in regard to her wound. This is measuring a little bit larger however. Fortunately I do not see any signs of infection this is on the right side. Fortunately the left side is actually completely healed which is great news. 07-03-2022 patient's wound unfortunately is continuing to show signs of being worse. Her compression which was the Tubigrip that I thought should be able to pull up actually slipped down and she was not able to pull that back up. With that being said that means that unfortunately her wound has continued to deteriorate since I last saw her. This is definitely not the direction that we are looking for. I discussed with her that I do believe we need to go ahead and see about getting her started on antibiotics and I subsequently would also like to go ahead and see about getting things moving forward with regard to a wound culture and making a change up her dressings as well but this can be changed more frequently than just once a week. 07-10-2022 upon evaluation today patient actually appears to be doing much better. I do believe that the antibiotics have been beneficial for her. Fortunately I do not see any signs of active infection locally nor systemically which is great news. No fevers, chills, nausea, vomiting, or diarrhea. 07-17-2022 upon evaluation today patient appears to be doing well currently in regard to her wound which is actually showing signs of improvement this is slow but nonetheless prevalent that we are seeing good improvement here. Fortunately I do not see any signs of active infection locally nor systemically which is great news. 07-24-2022 upon evaluation today  patient appears to be doing well currently in regard to her wound although the wrap that we had on has been slipping down and she really does not have anybody to help her with this at home health is not going to be coming out we could not get anybody that would actually be able to do the dressing changes. Fortunately I do not see any signs of active infection locally nor systemically which is great news. 08-07-22 poorly in regard to her leg compared to what it was previous. Fortunately there does not appear to be any signs of active infection locally nor systemically which is great news. No fevers, chills, nausea, vomiting, or diarrhea. With that being said it does appear that  the patient may have some cellulitis in the leg which is not good. 08-14-2022 upon evaluation today patient appears to be doing somewhat better in regard to her wounds she still is having quite a bit of pain we are still not where we want to be as far as healing is concerned completely. Fortunately I do not see any evidence of active infection locally nor systemically which is great news and I am very pleased in that regard. 08-21-23 upon evaluation today patient appears to be doing poorly currently in regard to her wound. She has been tolerating the dressing changes without complication. Fortunately there does not appear to be any signs of active infection locally nor systemically at this time. 08-28-2022 upon evaluation today patient appears to be doing poorly in regard to her wound this was not wrapped appropriately home health did not go up high enough on the wrap. This has caused some issues and I discussed that with the patient today. I do believe that she is going to require aggressive wrapping and treatment which she is getting the [facility] topical antibiotics today they can start using that at the next wrap on Friday. In the meantime they need to make sure that the rapid and appropriately we wrote very specific orders  today. 09-04-2022 upon evaluation today patient appears to be doing well currently in regard to her wound which I think is making progress is still hurting her quite significantly. Fortunately I do not see any signs of active infection locally or systemically which is great news. No fevers, chills, nausea, vomiting, or diarrhea. 09-11-2022 upon evaluation today patient's wound actually is showing signs of being a little bit smaller and looking a little bit better she still has a lot going on here however. Fortunately I do not see any evidence of active infection Worsening locally nor systemically which is great news. With that being said worsening still continue to use the topical Keystone antibiotics. 09-18-2022 upon evaluation today patient actually showing some signs of improvement. I am actually very pleased with where we stand compared to where we have been. I think that she is making good headway here. She is still having quite a bit of pain but it seems to be lessening compared to previous. 09-25-2022 upon evaluation patient's wound actually showing signs slowly but surely of improvement. Fortunately I do not see any signs of active infection at this time systemically and locally I feel like this is dramatically improved. She is doing well with the [facility] topical antibiotics. 10-02-2022 upon evaluation today patient appears to be doing better in regard to her wound overall. I am very pleased with where things stand from a visual standpoint I do not see any signs of active infection and in general I think that we are moving in the right direction here. 10-09-2022 upon evaluation today patient appears to be doing well currently in regard to her wound. She has been tolerating the dressing changes without complication. Fortunately I do not see any signs of active infection at this time which is great news. 10-16-2022 upon evaluation today patient continues to have quite a bit of pain in regard to her  right leg. We have been attempting to debride little by little as we could but again was pretty limited by the fact that she is having significant discomfort. We do not actually have any signs of active infection going on at this point that the [facility] topical antibiotics have been helping quite readily. I been attempting to do  as much debridement as I can but if she were to have pain medication this would actually help and in the past when she did it was much more tolerable for her as far as taking the edge off. Right now she has been without as she is in transition from her previous pain management physician to someone new to manage this. 10-23-2022 upon evaluation patient appears to be doing excellent in regard to her wound. She has been tolerating the dressing changes without complication. Fortunately I do not see any evidence of active infection locally nor systemically which is great news. No fevers, chills, nausea, vomiting, or diarrhea. 7/24; patient with a wound secondary to chronic venous insufficiency on the right lateral lower leg. We have been using Keystone antibiotic Hydrofera Blue under kerlix Coban. She complains of a lot of pain after last week's debridement otherwise things appear to be improved per discussion with our intake nurse. 11-06-2022 upon evaluation today patient appears to be doing excellent at this point in regard to her wound. She has been tolerating the dressing changes without complication and to be honest she is making excellent progress towards complete closure. I am actually very pleased with where we stand I think that she is doing excellent as far as the overall appearance of the wound is concerned as well. She is going require some debridement however. 11-20-2022 upon evaluation today patient appears to be doing well currently in regard to her wound. She has been tolerating the dressing changes without complication. Fortunately there does not appear to be any signs  of active infection locally or systemically which is great news and in general I do believe that we are moving in the right direction here. No fevers, chills, nausea, vomiting, or diarrhea. 11-27-2022 upon evaluation today patient appears to be doing a little better in regards to the overall appearance of her wound but unfortunately she is having increased pain. The collagen seems to be getting very dry and stuck to the wound bed which is causing her some issues here. Fortunately I do not see any signs of active infection locally nor systemically at this time. Fortunately I think that her wound is better but unfortunately her pain has not. For that reason I am going to avoid any debridement today since the patient is very upset about the pain and how bad this is hurting at this point. I think we can have to try something a little bit different I am thinking PolyMem may be a good option. 8/28; this patient has difficult venous wounds on the right lateral lower leg and ankle in the setting of chronic venous insufficiency. We have been using polymen under compression with kerlix Coban. 12-11-2022 upon evaluation today patient appears to be doing well currently in regard to her wound. She has been tolerating the dressing changes without Jamie [name], Jamie [name] ([id number]) [id number] Page 9 of 11 complication and actually appears to be doing much better this week compared to when I saw her 2 [name] ago. The wound is measuring significantly smaller. We are using PolyMem along with Kerlix and Coban. 12-18-2022 upon evaluation today patient appears to be doing well currently in regard to her wound. This is actually showing signs of improvement is measuring a little bit smaller and looking better. Fortunately I do not see any evidence of worsening overall and I believe that we will making good headway with the PolyMem and the [facility] topical antibiotics. 12-25-2022 upon evaluation today  patient appears to be doing well  currently in regard to her wound. She has been tolerating the dressing changes without complication. Fortunately I do not see any evidence of infection locally or systemically which is great news and in general I do believe that we will make an excellent headway towards complete closure also excellent news. 01-01-2023 upon evaluation patient's wound actually showing signs of improvement the wrap was actually put on properly this week and this is good news. Overall I am extremely happy with where things stand and how this appears today I do not see any signs of worsening overall and I believe that the patient is making excellent headway towards complete closure which is great news. 01/08/2023 upon evaluation today patient appears to be doing well currently in regard to her leg. She is actually draining much less unfortunately home health is just not doing very well getting the supplies necessary in order to continue to take care of the patient. They are now telling me they cannot get the PolyMem which is something that has been doing really well for her. That really does not make sense to me you came to get PolyMem for a hospice patient. Nonetheless either way I think we may need to just discontinue treatment with home health and just manage the patient here at the clinic. Objective Constitutional Well-nourished and well-hydrated in no acute distress. Vitals Time Taken: 11:25 AM, Height: 62 in, Weight: 171 lbs, BMI: 31.3, Temperature: 98.3 F, Pulse: 46 bpm, Respiratory Rate: 18 breaths/min, Blood Pressure: 137/73 mmHg. Respiratory normal breathing without difficulty. Psychiatric this patient is able to make decisions and demonstrates good insight into disease process. Alert and Oriented x 3. pleasant and cooperative. General Notes: Upon inspection patient's wound bed actually looked pretty clean she was having a lot of pain today so I did opt not to do any  debridement since there was not a significant amount of buildup of slough or biofilm at this point she was very pleased with this will see where things stand next week I may need to perform some debridement next week. Integumentary (Hair, Skin) Wound #5 status is Open. Original cause of wound was Trauma. The date acquired was: 04/17/2022. The wound has been in treatment 33 [name]. The wound is located on the Right,Lateral Lower Leg. The wound measures 7.5cm length x 4.5cm width x 0.1cm depth; 26.507cm^2 area and 2.651cm^3 volume. There is Fat Layer (Subcutaneous Tissue) exposed. There is no tunneling or undermining noted. There is a medium amount of serosanguineous drainage noted. The wound margin is distinct with the outline attached to the wound base. There is large (67-100%) red granulation within the wound bed. There is a small (1-33%) amount of necrotic tissue within the wound bed including Adherent Slough. The periwound skin appearance exhibited: Scarring, Dry/Scaly, Hemosiderin Staining. The periwound skin appearance did not exhibit: Callus, Crepitus, Excoriation, Induration, Rash, Maceration, Atrophie Blanche, Cyanosis, Ecchymosis, Mottled, Pallor, Rubor, Erythema. Periwound temperature was noted as No Abnormality. The periwound has tenderness on palpation. Wound #7 status is Open. Original cause of wound was Gradually Appeared. The date acquired was: 01/01/2023. The wound has been in treatment 1 [name]. The wound is located on the Right,Posterior Lower Leg. The wound measures 4cm length x 4cm width x 0.2cm depth; 12.566cm^2 area and 2.513cm^3 volume. There is Fat Layer (Subcutaneous Tissue) exposed. There is no tunneling or undermining noted. There is a medium amount of serosanguineous drainage noted. The wound margin is distinct with the outline attached to the wound base. There is small (1-33%) red,  pink granulation within the wound bed. There is a large (67- 100%) amount of necrotic tissue  within the wound bed including Adherent Slough. The periwound skin appearance exhibited: Scarring, Hemosiderin Staining. The periwound skin appearance did not exhibit: Callus, Crepitus, Excoriation, Induration, Rash, Dry/Scaly, Maceration, Atrophie Blanche, Cyanosis, Ecchymosis, Mottled, Pallor, Rubor, Erythema. Periwound temperature was noted as No Abnormality. The periwound has tenderness on palpation. 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 Leonard Schwartz Wednesday Room 8 01/15/23 at 10:15am Return Appointment in 2 [name]. Leonard Schwartz Wednesday Room 8 Return appointment in 3 [name]. Leonard Schwartz Wednesday ENZA, SHONE ([id number]) [id number] Page 10 of 11 Other: - Discharge home health Anesthetic: Wound #5 Right,Lateral Lower Leg: (In clinic) Topical Lidocaine 4% applied to wound bed Bathing/ Shower/ Hygiene: May shower and wash wound with soap and water. - with dressing changes. Edema Control - Lymphedema / SCD / Other: Elevate legs to the level of the heart or above for 30 minutes daily and/or when sitting for 3-4 times a day throughout the day. Avoid standing for long periods of time. Exercise regularly Home Health: Wound #5 Right,Lateral Lower Leg: Discontinue home health for wound care. Other Home Health Orders/Instructions: - [facility] in [place], [place] fax # [phone]. WOUND #5: - Lower Leg Wound Laterality: Right, Lateral Cleanser: Soap and Water 1 x Per Week/30 Days Discharge Instructions: May shower and wash wound with dial antibacterial soap and water prior to dressing change. Peri-Wound Care: Zinc Oxide Ointment 30g tube 1 x Per  Week/30 Days Discharge Instructions: Apply Zinc Oxide to periwound with each dressing change TO ANY MACERATED SKIN. Peri-Wound Care: Sween Lotion (Moisturizing lotion) 1 x Per Week/30 Days Discharge Instructions: Apply moisturizing lotion as directed Topical: compounding topical antibiotics 1 x Per Week/30 Days Discharge Instructions: apply Keystone directly to wound bed under the hydrofera blue. Home Health start. MIX ACCORDING TO THE PHARMACY INSTRUCTIONS. Prim Dressing: PolyMem Non-Adhesive Dressing, 4x4 in 1 x Per Week/30 Days ary Discharge Instructions: ******CUT SLITS INTO THE POLYMEM.***** Apply to wound bed as instructed Secondary Dressing: ABD Pad, 8x10 1 x Per Week/30 Days Discharge Instructions: Apply over primary dressing as directed. Secondary Dressing: Woven Gauze Sponge, Non-Sterile 4x4 in 1 x Per Week/30 Days Discharge Instructions: Apply over primary dressing as directed. Secondary Dressing: Zetuvit Plus 4x8 in 1 x Per Week/30 Days Discharge Instructions: Or double ABD pad Apply over primary dressing as directed. Com pression Wrap: Kerlix Roll 4.5x3.1 (in/yd) 1 x Per Week/30 Days Discharge Instructions: Apply Kerlix and Coban compression as directed. Com pression Wrap: Coban Self-Adherent Wrap 4x5 (in/yd) 1 x Per Week/30 Days Discharge Instructions: Apply over Kerlix as directed. Com pression Wrap: unna boot FIRST LAYER **** SEE INSTRUCTIONS 1 x Per Week/30 Days Discharge Instructions: APPLY FIRST LAYER UNNA BOOT AT BASES OF TOES AND JUST BELOW THE KNEE TO HOLD COMPRESSION WRAP IN PLACE. WOUND #7: - Lower Leg Wound Laterality: Right, Posterior Cleanser: Soap and Water 1 x Per Week/30 Days Discharge Instructions: May shower and wash wound with dial antibacterial soap and water prior to dressing change. Peri-Wound Care: Zinc Oxide Ointment 30g tube 1 x Per Week/30 Days Discharge Instructions: Apply Zinc Oxide to periwound with each dressing change TO ANY MACERATED  SKIN. Peri-Wound Care: Sween Lotion (Moisturizing  lotion) 1 x Per Week/30 Days Discharge Instructions: Apply moisturizing lotion as directed Topical: compounding topical antibiotics 1 x Per Week/30 Days Discharge Instructions: apply Keystone directly to wound bed under the hydrofera blue. Home Health start. MIX ACCORDING TO THE PHARMACY INSTRUCTIONS. Prim Dressing: PolyMem Non-Adhesive Dressing, 4x4 in 1 x Per Week/30 Days ary Discharge Instructions: ******CUT SLITS INTO THE POLYMEM.***** Apply to wound bed as instructed Secondary Dressing: ABD Pad, 8x10 1 x Per Week/30 Days Discharge Instructions: Apply over primary dressing as directed. Secondary Dressing: Woven Gauze Sponge, Non-Sterile 4x4 in 1 x Per Week/30 Days Discharge Instructions: Apply over primary dressing as directed. Secondary Dressing: Zetuvit Plus 4x8 in 1 x Per Week/30 Days Discharge Instructions: Or double ABD pad Apply over primary dressing as directed. Com pression Wrap: Kerlix Roll 4.5x3.1 (in/yd) 1 x Per Week/30 Days Discharge Instructions: Apply Kerlix and Coban compression as directed. Com pression Wrap: Coban Self-Adherent Wrap 4x5 (in/yd) 1 x Per Week/30 Days Discharge Instructions: Apply over Kerlix as directed. Com pression Wrap: unna boot FIRST LAYER **** SEE INSTRUCTIONS 1 x Per Week/30 Days Discharge Instructions: APPLY FIRST LAYER UNNA BOOT AT BASES OF TOES AND JUST BELOW THE KNEE TO HOLD COMPRESSION WRAP IN PLACE. 1. I would recommend that the patient should continue to monitor for any signs of infection or worsening. Based on what I am seeing I do believe that she is actually doing quite well I think we are sure to complete closure but were not there yet. 2. I would recommend that we continue with the PolyMem every week getting with the AandD ointment around the edges of the wound and subsequently working to continue with Unna boot wrap which I think is doing quite well. We will see patient back for  reevaluation in 1 week here in the clinic. If anything worsens or changes patient will contact our office for additional recommendations. Electronic Signature(s) Signed: 01/08/2023 5:28:41 PM By: Jamie Derry PA-C Entered By: Jamie [name] on 01/08/2023 14:28:41 Jamie [name] ([id number]) [id number] Page 11 of 11 -------------------------------------------------------------------------------- SuperBill Details Patient Name: Date of Service: Jamie [name], [place] 01/08/2023 Medical Record Number: [id number] Patient Account Number: [id number] Date of Birth/Sex: Treating RN: 1940-11-24 (82 y.o. [name], [name] Primary Care Provider: Delorise [name] Other Clinician: Referring Provider: Treating Provider/Extender: Jamie [name], Jamie [name] in Treatment: 33 Diagnosis Coding ICD-10 Codes Code Description [phone] Chronic venous hypertension (idiopathic) with ulcer and inflammation of right lower extremity 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d I25.10 Atherosclerotic heart disease of native coronary artery without angina pectoris Facility Procedures : CPT4 Code: [id number] Description: 99214 - WOUND CARE VISIT-LEV 4 EST PT Modifier: Quantity: 1 Physician Procedures : CPT4 Code Description Modifier [id number] 99213 - WC PHYS LEVEL 3 - EST PT ICD-10 Diagnosis Description I87.331 Chronic venous hypertension (idiopathic) with ulcer and inflammation of right lower extremity L97.812 Non-pressure chronic ulcer of other part  of right lower leg with fat layer exposed I25.10 Atherosclerotic heart disease of native coronary artery without angina pectoris Quantity: 1 Electronic Signature(s) Signed: 01/08/2023 5:29:19 PM By: Jamie Derry PA-C Previous Signature: 01/08/2023 1:07:27 PM Version By: Hunt Oris Entered By: Jamie [name] on 01/08/2023 14:29:19

## 2023-01-15 ENCOUNTER — Encounter (HOSPITAL_BASED_OUTPATIENT_CLINIC_OR_DEPARTMENT_OTHER): Payer: Medicare HMO | Admitting: Physician Assistant

## 2023-01-15 DIAGNOSIS — L97812 Non-pressure chronic ulcer of other part of right lower leg with fat layer exposed: Secondary | ICD-10-CM | POA: Diagnosis not present

## 2023-01-15 NOTE — Progress Notes (Signed)
BETANIA, DIZON ([id number]) [id number] Page 1 of 2 Visit Report for 01/15/2023 Chief Complaint Document Details Patient Name: Date of Service: [name], [name]. 01/15/2023 10:15 A M Medical Record Number: [id number] Patient Account Number: [id number] Date of Birth/Sex: Treating RN: 08/15/1940 (82 y.o. F) Primary Care Provider: [name] Other Clinician: Referring Provider: Treating Provider/Extender: [name], [name] in Treatment: 77 Information Obtained from: Patient Chief Complaint Right LE Ulcer Electronic Signature(s) Signed: 01/15/2023 10:11:38 AM By: Allen Derry PA-C Entered By: Allen Derry on 01/15/2023 07:11:38 -------------------------------------------------------------------------------- Problem List Details Patient Name: Date of Service: [name], [name] 01/15/2023 10:15 A M Medical Record Number: [id number] Patient Account Number: [id number] Date of Birth/Sex: Treating RN: 10/12/1940 (83 y.o. F) Primary Care Provider: [name] Other Clinician: Referring Provider: Treating Provider/Extender: [name], [name] in Treatment: 34 Active Problems ICD-10 Encounter Code Description Active Date MDM Diagnosis I87.331 Chronic venous hypertension (idiopathic) with ulcer and inflammation 05/22/2022 No Yes of right lower extremity L97.812 Non-pressure chronic ulcer of other part of right lower leg with fat 05/22/2022 No Yes layer exposed I25.10 Atherosclerotic heart disease of native coronary artery without angina 05/22/2022 No Yes pectoris Inactive Problems Resolved Problems Electronic Signature(s) Signed: 01/15/2023 10:11:32 AM By: Enis Slipper, Lysle Rubens ([id number]) [id number] Page 2 of 2 Entered By: Allen Derry on 01/15/2023 07:11:32

## 2023-01-16 NOTE — Progress Notes (Signed)
Jamie, [name] ([id number]) [id number] Page 1 of 8 Visit Report for 01/15/2023 Arrival Information Details Patient Name: Date of Service: Jamie [name], [name]. 01/15/2023 10:15 A M Medical Record Number: [id number] Patient Account Number: [id number] Date of Birth/Sex: Treating RN: [birth year] (82 y.o. F) Primary Care Adriel Desrosier: [name] Other Clinician: Referring Felcia Huebert: Treating Chemika Nightengale/Extender: [name], [name] in Treatment: 34 Visit Information History Since Last Visit Added or deleted any medications: No Patient Arrived: Wheel Chair Any new allergies or adverse reactions: No Arrival Time: 10:16 Had a fall or experienced change in No Accompanied By: niece activities of daily living that may affect Transfer Assistance: None risk of falls: Patient Identification Verified: Yes Signs or symptoms of abuse/neglect since last visito No Secondary Verification Process Completed: Yes Hospitalized since last visit: No Patient Requires Transmission-Based Precautions: No Implantable device outside of the clinic excluding No Patient Has Alerts: No cellular tissue based products placed in the center since last visit: Has Dressing in Place as Prescribed: Yes Has Compression in Place as Prescribed: Yes Pain Present Now: Yes Electronic Signature(s) Signed: 01/15/2023 5:41:53 PM By: Thayer Dallas Entered By: Thayer Dallas on 01/15/2023 07:21:39 -------------------------------------------------------------------------------- Encounter Discharge Information Details Patient Name: Date of Service: Jamie [name], [name] 01/15/2023 10:15 A M Medical Record Number: [id number] Patient Account Number: [id number] Date of Birth/Sex: Treating RN: 1941/03/14 (82 y.o. [name] Primary Care [name]: [name] Other Clinician: Referring Asiah Browder: Treating Kirtis Challis/Extender: [name], [name] in Treatment: 29 Encounter Discharge Information Items Post Procedure Vitals Discharge Condition: Stable Temperature (F): 97.8 Ambulatory Status: Wheelchair Pulse (bpm): 51 Discharge Destination: Home Respiratory Rate (breaths/min): 18 Transportation: Private Auto Blood Pressure (mmHg): 132/77 Accompanied By: niece Schedule Follow-up Appointment: Yes Clinical Summary of Care: Electronic Signature(s) Signed: 01/15/2023 5:49:45 PM By: Shawn Stall RN, BSN Entered By: Shawn Stall on 01/15/2023 08:11:41 [name] ([id number]) [id number] Page 2 of 8 -------------------------------------------------------------------------------- Lower Extremity Assessment Details Patient Name: Date of Service: Jamie [name], [name]. 01/15/2023 10:15 A M Medical Record Number: [id number] Patient Account Number: [id number] Date of Birth/Sex: Treating RN: [birth year] (82 y.o. F) Primary Care [name]: [name] Other Clinician: Referring [name]: Treating [name]/Extender: [name], [name] in Treatment: 34 Edema Assessment Assessed: [Left: No] [Right: No] Edema: [Left: Ye] [Right: s] Calf Left: Right: Point of Measurement: From Medial Instep 38 cm Ankle Left: Right: Point of Measurement: From Medial Instep 25 cm Vascular Assessment Extremity colors, hair growth, and conditions: Extremity Color: [Right:Normal] Hair Growth on Extremity: [Right:No] Temperature of Extremity: [Right:Warm] Capillary Refill: [Right:< 3 seconds] Dependent Rubor: [Right:No No] Electronic Signature(s) Signed: 01/15/2023 5:41:53 PM By: Thayer Dallas Entered By: Thayer Dallas on 01/15/2023 07:37:32 -------------------------------------------------------------------------------- Multi-Disciplinary Care Plan Details Patient Name: Date of Service: Jamie [name], [name] 01/15/2023 10:15 A M Medical  Record Number: [id number] Patient Account Number: [id number] Date of Birth/Sex: Treating RN: Jul 01, 1940 (82 y.o. [name] Primary Care Collen Hostler: [name] Other Clinician: Referring Sevag Shearn: Treating Leonardo Plaia/Extender: [name] in Treatment: 92 Multidisciplinary Care Plan reviewed with physician Active Inactive Pain, Acute or Chronic Nursing Diagnoses: Pain Management - Cyclic Acute (Dressing Change Related) Pain Management - Non-cyclic Acute (Procedural) Pain, acute or chronic: actual or potential Goals: Patient will verbalize adequate pain control and receive pain control interventions during procedures as needed [name], [name] ([id number]) [id number] Page 3 of 8 Date Initiated: 09/11/2022 Target Resolution Date: 02/06/2023 Goal Status: Active Patient/caregiver will verbalize adequate  pain control between visits Date Initiated: 09/11/2022 Target Resolution Date: 02/06/2023 Goal Status: Active Patient/caregiver will verbalize comfort level met Date Initiated: 09/11/2022 Target Resolution Date: 02/06/2023 Goal Status: Active Interventions: Complete pain assessment as per visit requirements Encourage patient to take pain medications as prescribed Provide education on pain management Provision of support: recognize patient pain, provide comfort and support as needed Reposition patient for comfort Treatment Activities: Administer pain control measures as ordered : 09/11/2022 Notes: Electronic Signature(s) Signed: 01/15/2023 5:49:45 PM By: Shawn Stall RN, BSN Entered By: Shawn Stall on 01/15/2023 08:04:43 -------------------------------------------------------------------------------- Pain Assessment Details Patient Name: Date of Service: Jamie [name], [name] 01/15/2023 10:15 A M Medical Record Number: [id number] Patient Account Number: [id number] Date of Birth/Sex: Treating  RN: 14-Mar-1941 (82 y.o. F) Primary Care [name]: [name] Other Clinician: Referring [name]: Treating [name]/Extender: [name], [name] in Treatment: 24 Active Problems Location of Pain Severity and Description of Pain Patient Has Paino Yes Site Locations Pain Location: Pain in Ulcers Rate the pain. Current Pain Level: 7 Pain Management and Medication Current Pain Management: Electronic Signature(s) Signed: 01/15/2023 5:41:53 PM By: Thayer Dallas Entered By: Thayer Dallas on 01/15/2023 07:49:42 [name] ([id number]) [id number] Page 4 of 8 -------------------------------------------------------------------------------- Patient/Caregiver Education Details Patient Name: Date of Service: Jamie [name], [place] 10/9/2024andnbsp10:15 A M Medical Record Number: [id number] Patient Account Number: [id number] Date of Birth/Gender: Treating RN: 06-23-1940 (82 y.o. [name] Primary Care Physician: [name] Other Clinician: Referring Physician: Treating Physician/Extender: [name], Billey Gosling in Treatment: 30 Education Assessment Education Provided To: Patient and Caregiver Education Topics Provided Wound/Skin Impairment: Handouts: Caring for Your Ulcer Methods: Explain/Verbal Responses: Reinforcements needed Electronic Signature(s) Signed: 01/15/2023 5:49:45 PM By: Shawn Stall RN, BSN Entered By: Shawn Stall on 01/15/2023 08:04:56 -------------------------------------------------------------------------------- Wound Assessment Details Patient Name: Date of Service: Gevena Mart W. 01/15/2023 10:15 A M Medical Record Number: [id number] Patient Account Number: [id number] Date of Birth/Sex: Treating RN: 03-10-1941 (82 y.o. F) Primary Care Deshannon Hinchliffe: [name] Other Clinician: Referring Seldon Barrell: Treating  Acey Woodfield/Extender: [name], [name] in Treatment: 34 Wound Status Wound Number: 5 Primary Venous Leg Ulcer Etiology: Wound Location: Right, Lateral Lower Leg Wound Open Wounding Event: Trauma Status: Date Acquired: 04/17/2022 Comorbid Sleep Apnea, Hypertension, Peripheral Venous Disease, Weeks Of Treatment: 34 History: Osteoarthritis, Neuropathy Clustered Wound: Yes Photos Wound Measurements Length: (cm) FABIANNA, KEATS ([id number]) Width: (cm) Depth: (cm) Clustered Quantity: Area: (cm) Volume: (cm) 6.5 % Reduction in Area: 22% [id number] Page 5 of 8 4 % Reduction in Volume: 74% 0.1 Epithelialization: Medium (34-66%) 1 Tunneling: No 20.42 Undermining: No 2.042 Wound Description Classification: Full Thickness Without Exposed Supp Wound Margin: Distinct, outline attached Exudate Amount: Medium Exudate Type: Serosanguineous Exudate Color: red, brown ort Structures Foul Odor After Cleansing: No Slough/Fibrino Yes Wound Bed Granulation Amount: Medium (34-66%) Exposed Structure Granulation Quality: Red Fascia Exposed: No Necrotic Amount: Medium (34-66%) Fat Layer (Subcutaneous Tissue) Exposed: Yes Necrotic Quality: Adherent Slough Tendon Exposed: No Muscle Exposed: No Joint Exposed: No Bone Exposed: No Periwound Skin Texture Texture Color No Abnormalities Noted: No No Abnormalities Noted: No Callus: No Atrophie Blanche: No Crepitus: No Cyanosis: No Excoriation: No Ecchymosis: No Induration: No Erythema: No Rash: No Hemosiderin Staining: Yes Scarring: Yes Mottled: No Pallor: No Moisture Rubor: No No Abnormalities Noted: No Dry / Scaly: Yes Temperature / Pain Maceration: No Temperature: No Abnormality Tenderness on Palpation: Yes Treatment Notes Wound #5 (Lower Leg) Wound Laterality: Right, Lateral  Cleanser Soap and Water Discharge Instruction: May shower and wash wound with dial  antibacterial soap and water prior to dressing change. Peri-Wound Care Zinc Oxide Ointment 30g tube Discharge Instruction: Apply Zinc Oxide to periwound with each dressing change TO ANY MACERATED SKIN. Sween Lotion (Moisturizing lotion) Discharge Instruction: Apply moisturizing lotion as directed Topical compounding topical antibiotics Discharge Instruction: apply Keystone directly to wound bed under the hydrofera blue. Home Health start. MIX ACCORDING TO THE PHARMACY INSTRUCTIONS. Primary Dressing PolyMem Non-Adhesive Dressing, 4x4 in Discharge Instruction: ******CUT SLITS INTO THE POLYMEM.***** Apply to wound bed as instructed Secondary Dressing ABD Pad, 8x10 Discharge Instruction: Apply over primary dressing as directed. Woven Gauze Sponge, Non-Sterile 4x4 in Discharge Instruction: Apply over primary dressing as directed. Zetuvit Plus 4x8 in Discharge Instruction: Or double ABD pad Apply over primary dressing as directed. Secured With Compression Wrap Kerlix Roll 4.5x3.1 (in/yd) Discharge Instruction: Apply Kerlix and Coban compression as directed. COLLEENA, KURTENBACH ([id number]) [id number] Page 6 of 8 Coban Self-Adherent Wrap 4x5 (in/yd) Discharge Instruction: Apply over Kerlix as directed. unna boot FIRST LAYER **** SEE INSTRUCTIONS Discharge Instruction: APPLY FIRST LAYER UNNA BOOT AT BASES OF TOES AND JUST BELOW THE KNEE TO HOLD COMPRESSION WRAP IN PLACE. Compression Stockings Add-Ons Electronic Signature(s) Signed: 01/15/2023 5:41:53 PM By: Thayer Dallas Entered By: Thayer Dallas on 01/15/2023 07:46:43 -------------------------------------------------------------------------------- Wound Assessment Details Patient Name: Date of Service: Jamie [name], Christean Grief. 01/15/2023 10:15 A M Medical Record Number: [id number] Patient Account Number: [id number] Date of Birth/Sex: Treating RN: 09-06-1940 (82 y.o. F) Primary Care Valeta Paz: [name] Other Clinician: Referring Kaelem Brach: Treating Hasina Kreager/Extender: [name], [name] in Treatment: 34 Wound Status Wound Number: 7 Primary Venous Leg Ulcer Etiology: Wound Location: Right, Posterior Lower Leg Wound Open Wounding Event: Gradually Appeared Status: Date Acquired: 01/01/2023 Comorbid Sleep Apnea, Hypertension, Peripheral Venous Disease, Weeks Of Treatment: 2 History: Osteoarthritis, Neuropathy Clustered Wound: No Photos Wound Measurements Length: (cm) 3.6 Width: (cm) 3.5 Depth: (cm) 0.2 Area: (cm) 9.896 Volume: (cm) 1.979 % Reduction in Area: 15.8% % Reduction in Volume: 15.8% Epithelialization: Small (1-33%) Tunneling: No Undermining: No Wound Description Classification: Full Thickness Without Exposed Suppor Wound Margin: Distinct, outline attached Exudate Amount: Medium Exudate Type: Serosanguineous Exudate Color: red, brown t Structures Foul Odor After Cleansing: No Slough/Fibrino Yes Wound Bed Granulation Amount: Small (1-33%) Exposed Structure Granulation Quality: Red, Pink Fascia Exposed: No Necrotic Amount: Large (67-100%) Fat Layer (Subcutaneous Tissue) Exposed: Yes Necrotic Quality: Adherent Slough Tendon Exposed: No Muscle Exposed: No SUMMIT, BORCHARDT ([id number]) [id number] Page 7 of 8 Joint Exposed: No Bone Exposed: No Periwound Skin Texture Texture Color No Abnormalities Noted: No No Abnormalities Noted: No Callus: No Atrophie Blanche: No Crepitus: No Cyanosis: No Excoriation: No Ecchymosis: No Induration: No Erythema: No Rash: No Hemosiderin Staining: Yes Scarring: Yes Mottled: No Pallor: No Moisture Rubor: No No Abnormalities Noted: No Dry / Scaly: No Temperature / Pain Maceration: No Temperature: No Abnormality Tenderness on Palpation: Yes Treatment Notes Wound #7 (Lower Leg) Wound Laterality: Right, Posterior Cleanser Soap and Water Discharge  Instruction: May shower and wash wound with dial antibacterial soap and water prior to dressing change. Peri-Wound Care Zinc Oxide Ointment 30g tube Discharge Instruction: Apply Zinc Oxide to periwound with each dressing change TO ANY MACERATED SKIN. Sween Lotion (Moisturizing lotion) Discharge Instruction: Apply moisturizing lotion as directed Topical compounding topical antibiotics Discharge Instruction: apply Keystone directly to wound bed under the hydrofera blue. Home Health start. MIX ACCORDING TO THE PHARMACY INSTRUCTIONS. Primary  Dressing PolyMem Non-Adhesive Dressing, 4x4 in Discharge Instruction: ******CUT SLITS INTO THE POLYMEM.***** Apply to wound bed as instructed Secondary Dressing ABD Pad, 8x10 Discharge Instruction: Apply over primary dressing as directed. Woven Gauze Sponge, Non-Sterile 4x4 in Discharge Instruction: Apply over primary dressing as directed. Zetuvit Plus 4x8 in Discharge Instruction: Or double ABD pad Apply over primary dressing as directed. Secured With Compression Wrap Kerlix Roll 4.5x3.1 (in/yd) Discharge Instruction: Apply Kerlix and Coban compression as directed. Coban Self-Adherent Wrap 4x5 (in/yd) Discharge Instruction: Apply over Kerlix as directed. unna boot FIRST LAYER **** SEE INSTRUCTIONS Discharge Instruction: APPLY FIRST LAYER UNNA BOOT AT BASES OF TOES AND JUST BELOW THE KNEE TO HOLD COMPRESSION WRAP IN PLACE. Compression Stockings Add-Ons Electronic Signature(s) Signed: 01/15/2023 5:41:53 PM By: Thayer Dallas Entered By: Thayer Dallas on 01/15/2023 07:47:25 [name] ([id number]) [id number] Page 8 of 8 -------------------------------------------------------------------------------- Vitals Details Patient Name: Date of Service: Jamie [name], [name]. 01/15/2023 10:15 A M Medical Record Number: [id number] Patient Account Number: [id number] Date of Birth/Sex: Treating RN: [birth year] (82 y.o.  F) Primary Care Riah Kehoe: [name] Other Clinician: Referring Dane Kopke: Treating Berkley Cronkright/Extender: [name], [name] in Treatment: 34 Vital Signs Time Taken: 10:36 Temperature (F): 97.8 Height (in): 62 Pulse (bpm): 51 Weight (lbs): 171 Respiratory Rate (breaths/min): 18 Body Mass Index (BMI): 31.3 Blood Pressure (mmHg): 132/77 Reference Range: 80 - 120 mg / dl Electronic Signature(s) Signed: 01/15/2023 5:41:53 PM By: Thayer Dallas Entered By: Thayer Dallas on 01/15/2023 07:36:32

## 2023-01-22 ENCOUNTER — Encounter (HOSPITAL_BASED_OUTPATIENT_CLINIC_OR_DEPARTMENT_OTHER): Payer: Medicare HMO | Admitting: Physician Assistant

## 2023-01-22 DIAGNOSIS — L97812 Non-pressure chronic ulcer of other part of right lower leg with fat layer exposed: Secondary | ICD-10-CM | POA: Diagnosis not present

## 2023-01-22 NOTE — Progress Notes (Addendum)
Jamie [name], Jamie [name] ([id number]) [id number] Page 1 of 11 Visit Report for 01/22/2023 Chief Complaint Document Details Patient Name: Date of Service: Jamie [name], [name]. 01/22/2023 10:30 A M Medical Record Number: [id number] Patient Account Number: [id number] Date of Birth/Sex: Treating RN: 1940-05-28 (82 y.o. F) Primary Care Provider: Belva Agee Other Clinician: Referring Provider: Treating Provider/Extender: [name], [name] in Treatment: 35 Information Obtained from: Patient Chief Complaint Right LE Ulcer Electronic Signature(s) Signed: 01/22/2023 10:39:24 AM By: Allen Derry PA-C Entered By: Allen Derry on 01/22/2023 10:39:24 -------------------------------------------------------------------------------- HPI Details Patient Name: Date of Service: Jamie [name], [name] 01/22/2023 10:30 A M Medical Record Number: [id number] Patient Account Number: [id number] Date of Birth/Sex: Treating RN: 08-27-1940 (82 y.o. F) Primary Care Provider: Belva Agee Other Clinician: Referring Provider: Treating Provider/Extender: [name], [name] in Treatment: 35 History of Present Illness HPI Description: ADMISSION 06/30/2020 Mrs. Odenthal is an 82 year old woman who lives in [place]. She is here with her niece for review of wounds on the left medial lower leg and ankle. These have apparently been present for over a year and she followed with Dr. Olegario Messier at the [facility] in [place] for quite a period of time although it looks as though there was a initial consult wound from Dr. Marcha Solders on February 18 presumably there was therefore hiatus. At that point the wounds were described as being there for 3 months. She also tells me she was at the wound care center in [place] for a period of time with this. 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 She had  venous studies that were negative for DVT ABIs on the right were 1.01 on the left 1.06. She has . had previous applications of puraply, compression which she does not tolerate very well. She has had several rounds of oral antibiotic therapy. She complains of unrelenting pain and she is seeing Dr. Reece Agar V of pain management apparently was on oxycodone but that did not help. She also had a skin biopsy done by Dr. Olegario Messier although we do not have that result. She does not appear to have an arterial issue. I am not completely clear what she has been putting on the wounds lately. 3/31; this patient has a particularly nasty set of wounds on the left medial ankle in the middle of what looks to be hemosiderin deposition secondary to chronic venous insufficiency. She has a lot of pain followed by pain management. Dr. Marcha Solders apparently did a biopsy of something on the left leg last fall what I would like to get this result. She has a history of MRSA treatment. I do not believe she had reflux studies but she did have DVT rule out studies. Previous ABIs have not suggested arterial insufficiency 4/7; difficult wounds on the left medial ankle probably chronic venous insufficiency. With considerable effort on behalf of our case manager we were able to finally to speak to somebody at the hospital in [place] who indicated that no biopsy of this area have been done even though the patient describes this in some detail and is even able to point out where she thinks the biopsy was done. We have been using Sorbact. The PCR culture I did showed polymicrobial identification with Pseudomonas, staph aureus, Peptostreptococcus. All of this and low titers. Resistance genes identified were MRSA, staph virulence gene and tetracycline. We are going to send this to [facility] for a topical antibiotic which is something  that we have had good success with recently in large venous ulcers with a lot of purulent drainage. There would not be an  easy oral alternative here possibly line escalated and ciprofloxacin if we need to use systemic antibiotic 4/15; difficult area on the left medial ankle. Most likely chronic venous insufficiency. I think she will probably need venous reflux study I think she had DVT rule outs but not venous reflux studies. We have not yet obtained the topical antibiotics. She has home health changing the dressing we have been using Sorbact for adherent fibrinous debris on the surface. Very difficult to remove Jamie [name], Jamie [name] ([id number]) [id number] Page 2 of 11 4/22; patient presents for 1 week follow-up. She has been using sore back under compression wraps and these are changed 3 times a week with home health. She also had Keystone antibiotics sent to her house and brought them in today. She has no complaints or issues today. 5/2; patient is here for follow-up. She has been using Sorbact under compression. Very painful wound. She has been using Keystone antibiotics. Not much improvement although the more medial part of the wound has cleaned up nicely and the larger part of the wound about 50% slough covered. Part of the issue here is that she had stays at both [facility] wound care center, [facility] wound care center and now [place]. Not sure if she has had venous reflux studies. As far as we are able to tell she did not have a biopsy. My notes state that she did not have venous reflux studies just DVT rule outs. 5/16; patient goes for venous reflux studies this afternoon. She says that wound was biopsied which sounds like punch biopsies by Dr. Lynden Ang we do not have these results. We are using Sorbact. Very difficult wound to debride 5/23; patient presents for 1 week follow-up. She reports tolerating the wraps well with sorbact underneath. She had ABIs and venous reflux studies done. She has no issues or complaints today. She denies acute signs of infection. 6/6; I have reviewed the patient's  vascular studies. Wounds are on the left medial and posterior calf. She had significant reflux in the greater saphenous vein in the in the mid thigh, distal thigh knee and the small saphenous vein in the popliteal fossa. The vein diameters do not look too impressive though. She is going to see the vascular surgeon on Wednesday. She also had venous reflux in the right common femoral vein. She did not have any evidence of a DVT or SVT I am wondering whether there is an ablation procedure that would benefit her in the greater saphenous vein on the left. . She tells [place] that home health put the dressing on too tight and she took off 1 layer. The swelling in her left leg is a little worse as a result of this. Not much change in the wound measurements so the surface of the wound looks better Her arterial studies showed an ABI on the right of 1.14 with a triphasic waveform and a great toe pressure of 0.89. On the left her ABIs were noncompressible at 1.34 but with triphasic waveforms and a TBI of 0.97. Her greater toe pressure was 122. There was no evidence of significant bilateral arterial disease 6/20 patient went to see Dr. Durwin Nora. He did not think she had significant arterial disease. In terms of her venous duplex on the right side there was no evidence of a DVT or SVT there was deep venous reflux involving the common  femoral vein no superficial vein reflux on the left side there was no DVT or SVT there was no deep vein graft reflux there was some reflux in the greater saphenous vein from the mid thigh to the knee but the vein here was not dilated. He thought these were venous wounds he prescribed a compression pump but I am not sure who we ordered it from The patient has been approved for Apligraf. Still using silver collagen this week 7/5; Apligraf #1 7/19 Apligraf #2. Decent improvement in the condition of the wound bed. Epithelialization distal 8/2 Apligraf #3. No issues or complaints. Denies signs of  infection. 8/17 Apligraf #4. No issues or concerns. Some complaints of pruritus and the rash. Our intake nurse brought up the fact that she had previously indicated possible cotton layer sensitivity we will therefore use kerlix in the bottom layer of the compression 8/30; the patient comes in with the area on her left medial leg just about healed. There is a superficial area more towards the tibia and a smaller open area distally everything else is epithelialized. I do not think she requires another Apligraf 9/13; left medial leg is healed. 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 Her edema control is good Readmisstion: 05-22-2022 upon evaluation today patient presents for initial inspection here in our clinic concerning issues that she has been having with a wound of the right lateral lower extremity. This is an area of a previous skin graft she tells me. With that being said she unfortunately had a scrape on this that occurred around 10 January. Since that time she has noted that this has just continued to get bigger and bigger in her niece who is present with her today actually states that 2 weeks ago when she saw that it was significantly smaller than what she sees currently. Obviously this is of utmost concern as we do not want this to continue to get larger when arrested and get moving in the right direction. Fortunately there does not appear to be any signs of systemic infection though locally I think we probably do have some infection present. I would obtain a culture to see what we have going on here and then we will subsequently see where things go going forward. Fortunately I think that she is in the right place at this point being at the wound center we can definitely do something to try to get this moving in the right direction. Patient does have a history of chronic venous insufficiency as well as coronary artery disease. In  the past she has done well with compression wraps on the start with a 3 layer wrap I think she is probably can end up going to a 4-layer at some point but we will see how things do over the next week. She has been using wound cleanser along with she tells me a zinc and topical but again I am not sure exactly what that was. 05-29-2022 upon evaluation today patient appears to be doing well currently in regard to her wound. This actually is showing signs of improvement I am happy in that regard unfortunately her left leg has an area on the shin that has opened. This is the region where one of the areas at least we have previously taken care of. Nonetheless this is small hoping we get it under control before things worsen significantly. 06-05-2022 upon evaluation today patient appears to be doing well currently in regard  to her wounds. The actually seem to be fairly clean she is having still quite a bit of problem with pain at this point she would like to not have debridement today for all possible. With that being said I do believe that we are making some good progress I think the [facility] is doing a decent job here. 3/6; patient has had 3/6; patient with known severe chronic venous insufficiency. She apparently had a traumatic wound at home and has a reasonably substantial wound on the right lateral lower leg and a smaller one on the left anterior lower leg as well. We have been using Hydrofera Blue and Unna boots 3/13; patient presents for follow-up. We have been using Hydrofera Blue under Coflex to the legs bilaterally. She has no issues or complaints today. 06-26-2022 upon evaluation today patient appears to be doing well currently in regard to her wound. This is measuring a little bit larger however. Fortunately I do not see any signs of infection this is on the right side. Fortunately the left side is actually completely healed which is great news. 07-03-2022 patient's wound unfortunately is  continuing to show signs of being worse. Her compression which was the Tubigrip that I thought should be able to pull up actually slipped down and she was not able to pull that back up. With that being said that means that unfortunately her wound has continued to deteriorate since I last saw her. This is definitely not the direction that we are looking for. I discussed with her that I do believe we need to go ahead and see about getting her started on antibiotics and I subsequently would also like to go ahead and see about getting things moving forward with regard to a wound culture and making a change up her dressings as well but this can be changed more frequently than just once a week. 07-10-2022 upon evaluation today patient actually appears to be doing much better. I do believe that the antibiotics have been beneficial for her. Fortunately I do not see any signs of active infection locally nor systemically which is great news. No fevers, chills, nausea, vomiting, or diarrhea. 07-17-2022 upon evaluation today patient appears to be doing well currently in regard to her wound which is actually showing signs of improvement this is slow but nonetheless prevalent that we are seeing good improvement here. Fortunately I do not see any signs of active infection locally nor systemically which is great news. 07-24-2022 upon evaluation today patient appears to be doing well currently in regard to her wound although the wrap that we had on has been slipping down and she really does not have anybody to help her with this at home health is not going to be coming out we could not get anybody that would actually be able to do the dressing changes. Fortunately I do not see any signs of active infection locally nor systemically which is great news. 08-07-22 poorly in regard to her leg compared to what it was previous. Fortunately there does not appear to be any signs of active infection locally nor systemically which is  great news. No fevers, chills, nausea, vomiting, or diarrhea. With that being said it does appear that the patient may have some cellulitis in the leg which is not good. 08-14-2022 upon evaluation today patient appears to be doing somewhat better in regard to her wounds she still is having quite a bit of pain we are still not where we want to be as  far as healing is concerned completely. Fortunately I do not see any evidence of active infection locally nor systemically which is great news and I am very pleased in that regard. Jamie [name], Jamie [name] ([id number]) [id number] Page 3 of 11 08-21-23 upon evaluation today patient appears to be doing poorly currently in regard to her wound. She has been tolerating the dressing changes without complication. Fortunately there does not appear to be any signs of active infection locally nor systemically at this time. 08-28-2022 upon evaluation today patient appears to be doing poorly in regard to her wound this was not wrapped appropriately home health did not go up high enough on the wrap. This has caused some issues and I discussed that with the patient today. I do believe that she is going to require aggressive wrapping and treatment which she is getting the [facility] topical antibiotics today they can start using that at the next wrap on Friday. In the meantime they need to make sure that the rapid and appropriately we wrote very specific orders today. 09-04-2022 upon evaluation today patient appears to be doing well currently in regard to her wound which I think is making progress is still hurting her quite significantly. Fortunately I do not see any signs of active infection locally or systemically which is great news. No fevers, chills, nausea, vomiting, or diarrhea. 09-11-2022 upon evaluation today patient's wound actually is showing signs of being a little bit smaller and looking a little bit better she still has a lot going on  here however. Fortunately I do not see any evidence of active infection Worsening locally nor systemically which is great news. With that being said worsening still continue to use the topical Keystone antibiotics. 09-18-2022 upon evaluation today patient actually showing some signs of improvement. I am actually very pleased with where we stand compared to where we have been. I think that she is making good headway here. She is still having quite a bit of pain but it seems to be lessening compared to previous. 09-25-2022 upon evaluation patient's wound actually showing signs slowly but surely of improvement. Fortunately I do not see any signs of active infection at this time systemically and locally I feel like this is dramatically improved. She is doing well with the [facility] topical antibiotics. 10-02-2022 upon evaluation today patient appears to be doing better in regard to her wound overall. I am very pleased with where things stand from a visual standpoint I do not see any signs of active infection and in general I think that we are moving in the right direction here. 10-09-2022 upon evaluation today patient appears to be doing well currently in regard to her wound. She has been tolerating the dressing changes without complication. Fortunately I do not see any signs of active infection at this time which is great news. 10-16-2022 upon evaluation today patient continues to have quite a bit of pain in regard to her right leg. We have been attempting to debride little by little as we could but again was pretty limited by the fact that she is having significant discomfort. We do not actually have any signs of active infection going on at this point that the [facility] topical antibiotics have been helping quite readily. I been attempting to do as much debridement as I can but if she were to have pain medication this would actually help and in the past when she did it was much more tolerable for her as far as  taking the edge off.  Right now she has been without as she is in transition from her previous pain management physician to someone new to manage this. 10-23-2022 upon evaluation patient appears to be doing excellent in regard to her wound. She has been tolerating the dressing changes without complication. Fortunately I do not see any evidence of active infection locally nor systemically which is great news. No fevers, chills, nausea, vomiting, or diarrhea. 7/24; patient with a wound secondary to chronic venous insufficiency on the right lateral lower leg. We have been using Keystone antibiotic Hydrofera Blue under kerlix Coban. She complains of a lot of pain after last week's debridement otherwise things appear to be improved per discussion with our intake nurse. 11-06-2022 upon evaluation today patient appears to be doing excellent at this point in regard to her wound. She has been tolerating the dressing changes without complication and to be honest she is making excellent progress towards complete closure. I am actually very pleased with where we stand I think that she is doing excellent as far as the overall appearance of the wound is concerned as well. She is going require some debridement however. 11-20-2022 upon evaluation today patient appears to be doing well currently in regard to her wound. She has been tolerating the dressing changes without complication. Fortunately there does not appear to be any signs of active infection locally or systemically which is great news and in general I do believe that we are moving in the right direction here. No fevers, chills, nausea, vomiting, or diarrhea. 11-27-2022 upon evaluation today patient appears to be doing a little better in regards to the overall appearance of her wound but unfortunately she is having increased pain. The collagen seems to be getting very dry and stuck to the wound bed which is causing her some issues here. Fortunately I do not see  any signs of active infection locally nor systemically at this time. Fortunately I think that her wound is better but unfortunately her pain has not. For that reason I am going to avoid any debridement today since the patient is very upset about the pain and how bad this is hurting at this point. I think we can have to try something a little bit different I am thinking PolyMem may be a good option. 8/28; this patient has difficult venous wounds on the right lateral lower leg and ankle in the setting of chronic venous insufficiency. We have been using polymen under compression with kerlix Coban. 12-11-2022 upon evaluation today patient appears to be doing well currently in regard to her wound. She has been tolerating the dressing changes without complication and actually appears to be doing much better this week compared to when I saw her 2 weeks ago. The wound is measuring significantly smaller. We are using PolyMem along with Kerlix and Coban. 12-18-2022 upon evaluation today patient appears to be doing well currently in regard to her wound. This is actually showing signs of improvement is measuring a little bit smaller and looking better. Fortunately I do not see any evidence of worsening overall and I believe that we will making good headway with the PolyMem and the [facility] topical antibiotics. 12-25-2022 upon evaluation today patient appears to be doing well currently in regard to her wound. She has been tolerating the dressing changes without complication. Fortunately I do not see any evidence of infection locally or systemically which is great news and in general I do believe that we will make an excellent headway towards complete closure also excellent  news. 01-01-2023 upon evaluation patient's wound actually showing signs of improvement the wrap was actually put on properly this week and this is good news. Overall I am extremely happy with where things stand and how this appears today I do not  see any signs of worsening overall and I believe that the patient is making excellent headway towards complete closure which is great news. 01/08/2023 upon evaluation today patient appears to be doing well currently in regard to her leg. She is actually draining much less unfortunately home health is just not doing very well getting the supplies necessary in order to continue to take care of the patient. They are now telling me they cannot get the PolyMem which is something that has been doing really well for her. That really does not make sense to me you came to get PolyMem for a hospice patient. Nonetheless either way I think we may need to just discontinue treatment with home health and just manage the patient here at the clinic. 01-15-2023 upon evaluation today patient appears to be doing well currently in regard to her wound. She has been tolerating the dressing changes without complication. Fortunately I do not see any signs of infection I think she is doing quite well and very pleased with where we stand today. No fevers, chills, nausea, vomiting, or diarrhea. 01-22-2023 upon evaluation today patient appears to be doing well currently in regard to her wound. She has been tolerating the dressing changes without complication and both wounds are measuring smaller today and look to be doing excellent. I am actually very pleased with what ever standing here and I think that she is making really good headway towards closure which is great news. Electronic Signature(s) Signed: 01/22/2023 11:45:46 AM By: Allen Derry PA-C Entered By: Allen Derry on 01/22/2023 11:45:46 Jamie [name] ([id number]) [id number] Page 4 of 11 -------------------------------------------------------------------------------- Physical Exam Details Patient Name: Date of Service: Jamie [name], [name]. 01/22/2023 10:30 A M Medical Record Number: [id number] Patient Account Number: [id number] Date  of Birth/Sex: Treating RN: Sep 11, 1940 (82 y.o. F) Primary Care Provider: Belva Agee Other Clinician: Referring Provider: Treating Provider/Extender: [name], [name] in Treatment: 35 Constitutional Well-nourished and well-hydrated in no acute distress. Respiratory normal breathing without difficulty. Psychiatric this patient is able to make decisions and demonstrates good insight into disease process. Alert and Oriented x 3. pleasant and cooperative. Notes Upon inspection patient's wound bed actually showed signs of good granulation and epithelization at this point. Fortunately I see no evidence of worsening overall and I do believe that the patient is making good headway towards complete closure which is great news. Electronic Signature(s) Signed: 01/22/2023 11:46:22 AM By: Allen Derry PA-C Entered By: Allen Derry on 01/22/2023 11:46:22 -------------------------------------------------------------------------------- Physician Orders Details Patient Name: Date of Service: Jamie [name], [name] 01/22/2023 10:30 A M Medical Record Number: [id number] Patient Account Number: [id number] Date of Birth/Sex: Treating RN: 1940-09-11 (82 y.o. 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 Leonard Schwartz Wednesday 01/29/2023 1030 (already scheduled) Return A ppointment in 2 weeks. Leonard Schwartz Wednesday (  already scheduled) Return A Return  appointment in 3 weeks. Leonard Schwartz Wednesday (already scheduled) Return appointment in 1 month. Leonard Schwartz Wednesday (already scheduled) Anesthetic Wound #5 Right,Lateral Lower Leg (In clinic) Topical Lidocaine 4% applied to wound bed Bathing/ Shower/ Hygiene May shower and wash wound with soap and water. - with dressing changes. MAKINZEY, SIMPKINS ([id number]) [id number] Page 5 of 11 Edema Control - Lymphedema / SCD / Other Right Lower Extremity Elevate legs to the level of the heart or above for 30 minutes daily and/or when sitting for 3-4 times a day throughout the day. Avoid standing for long periods of time. Exercise regularly Wound Treatment Wound #5 - Lower Leg Wound Laterality: Right, Lateral Cleanser: Soap and Water 1 x Per Week/30 Days Discharge Instructions: May shower and wash wound with dial antibacterial soap and water prior to dressing change. Peri-Wound Care: Zinc Oxide Ointment 30g tube 1 x Per Week/30 Days Discharge Instructions: Apply Zinc Oxide to periwound with each dressing change TO ANY MACERATED SKIN. Peri-Wound Care: Sween Lotion (Moisturizing lotion) 1 x Per Week/30 Days Discharge Instructions: Apply moisturizing lotion as directed Topical: compounding topical antibiotics 1 x Per Week/30 Days Discharge Instructions: apply Keystone directly to wound bed under the hydrofera blue. Home Health start. MIX ACCORDING TO THE PHARMACY INSTRUCTIONS. Prim Dressing: PolyMem Non-Adhesive Dressing, 4x4 in 1 x Per Week/30 Days ary Discharge Instructions: ******CUT SLITS INTO THE POLYMEM.***** Apply to wound bed as instructed Secondary Dressing: ABD Pad, 8x10 1 x Per Week/30 Days Discharge Instructions: Apply over primary dressing as directed. Secondary Dressing: Woven Gauze Sponge, Non-Sterile 4x4 in 1 x Per Week/30 Days Discharge Instructions: Apply over primary dressing as directed. Secondary Dressing: Zetuvit Plus 4x8 in 1 x Per Week/30  Days Discharge Instructions: Or double ABD pad Apply over primary dressing as directed. Compression Wrap: Kerlix Roll 4.5x3.1 (in/yd) 1 x Per Week/30 Days Discharge Instructions: Apply Kerlix and Coban compression as directed. Compression Wrap: Coban Self-Adherent Wrap 4x5 (in/yd) 1 x Per Week/30 Days Discharge Instructions: Apply over Kerlix as directed. Compression Wrap: unna boot FIRST LAYER **** SEE INSTRUCTIONS 1 x Per Week/30 Days Discharge Instructions: APPLY FIRST LAYER UNNA BOOT AT BASES OF TOES AND JUST BELOW THE KNEE TO HOLD COMPRESSION WRAP IN PLACE. Wound #7 - Lower Leg Wound Laterality: Right, Posterior Cleanser: Soap and Water 1 x Per Week/30 Days Discharge Instructions: May shower and wash wound with dial antibacterial soap and water prior to dressing change. Peri-Wound Care: Zinc Oxide Ointment 30g tube 1 x Per Week/30 Days Discharge Instructions: Apply Zinc Oxide to periwound with each dressing change TO ANY MACERATED SKIN. Peri-Wound Care: Sween Lotion (Moisturizing lotion) 1 x Per Week/30 Days Discharge Instructions: Apply moisturizing lotion as directed Topical: compounding topical antibiotics 1 x Per Week/30 Days Discharge Instructions: apply Keystone directly to wound bed under the hydrofera blue. Home Health start. MIX ACCORDING TO THE PHARMACY INSTRUCTIONS. Prim Dressing: PolyMem Non-Adhesive Dressing, 4x4 in 1 x Per Week/30 Days ary Discharge Instructions: ******CUT SLITS INTO THE POLYMEM.***** Apply to wound bed as instructed Secondary Dressing: ABD Pad, 8x10 1 x Per Week/30 Days Discharge Instructions: Apply over primary dressing as directed. Secondary Dressing: Woven Gauze Sponge, Non-Sterile 4x4 in 1 x Per Week/30 Days Discharge Instructions: Apply over primary dressing as directed. Secondary Dressing: Zetuvit Plus 4x8 in 1 x Per Week/30 Days Discharge Instructions: Or double ABD pad Apply over primary dressing as directed. Compression Wrap: Kerlix Roll  4.5x3.1 (in/yd) 1 x Per Week/30 Days Discharge Instructions: Apply Kerlix  and Coban compression as directed. Compression Wrap: Coban Self-Adherent Wrap 4x5 (in/yd) 1 x Per Week/30 Days Discharge Instructions: Apply over Kerlix as directed. Compression Wrap: unna boot FIRST LAYER **** SEE INSTRUCTIONS 1 x Per Week/30 Days Discharge Instructions: APPLY FIRST LAYER UNNA BOOT AT BASES OF TOES AND JUST BELOW THE KNEE TO HOLD COMPRESSION WRAP IN PLACE. Jamie [name], Jamie [name] ([id number]) [id number] Page 6 of 11 Electronic Signature(s) Signed: 01/22/2023 4:26:00 PM By: Allen Derry PA-C Signed: 02/03/2023 4:05:37 PM By: Redmond Pulling RN, BSN Entered By: Redmond Pulling on 01/22/2023 11:41:54 -------------------------------------------------------------------------------- Problem List Details Patient Name: Date of Service: Jamie [name], [name] 01/22/2023 10:30 A M Medical Record Number: [id number] Patient Account Number: [id number] Date of Birth/Sex: Treating RN: [birth year] (82 y.o. F) Primary Care Provider: Belva Agee Other Clinician: Referring Provider: Treating Provider/Extender: [name], [name] in Treatment: 35 Active Problems ICD-10 Encounter Code Description Active Date MDM Diagnosis I87.331 Chronic venous hypertension (idiopathic) with ulcer and inflammation of right 05/22/2022 No Yes lower extremity L97.812 Non-pressure chronic ulcer of other part of right lower leg with fat layer 05/22/2022 No Yes exposed I25.10 Atherosclerotic heart disease of native coronary artery without angina pectoris 05/22/2022 No Yes Inactive Problems Resolved Problems Electronic Signature(s) Signed: 01/22/2023 10:39:09 AM By: Allen Derry PA-C Entered By: Allen Derry on 01/22/2023 10:39:09 -------------------------------------------------------------------------------- Progress Note Details Patient Name: Date of Service: Jamie [name], [name]  01/22/2023 10:30 A M Medical Record Number: [id number] Patient Account Number: [id number] Date of Birth/Sex: Treating RN: [birth year] (82 y.o. F) Primary Care Provider: Belva Agee Other Clinician: Referring Provider: Treating Provider/Extender: [name], [name] in Treatment: 35 Subjective Chief Complaint Information obtained from Patient FILZA, STEFFENS ([id number]) [id number] Page 7 of 11 Right LE Ulcer History of Present Illness (HPI) ADMISSION 06/30/2020 Mrs. Pilkenton is an 82 year old woman who lives in [place]. She is here with her niece for review of wounds on the left medial lower leg and ankle. These have apparently been present for over a year and she followed with Dr. Olegario Messier at the [facility] in [place] for quite a period of time although it looks as though there was a initial consult wound from Dr. Marcha Solders on February 18 presumably there was therefore hiatus. At that point the wounds were described as being there for 3 months. She also tells me she was at the wound care center in [place] for a period of time with this. 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 She had venous studies that were negative for DVT ABIs on the right were 1.01 on the left 1.06. She has . had previous applications of puraply, compression which she does not tolerate very well. She has had several rounds of oral antibiotic therapy. She complains of unrelenting pain and she is seeing Dr. Reece Agar V of pain management apparently was on oxycodone but that did not help. She also had a skin biopsy done by Dr. Olegario Messier although we do not have that result. She does not appear to have an arterial issue. I am not completely clear what she has been putting on the wounds lately. 3/31; this patient has a particularly nasty set of wounds on the left medial ankle in the middle of what looks to be hemosiderin  deposition secondary to chronic venous insufficiency. She has a lot of pain followed by pain management. Dr. Marcha Solders apparently did a biopsy of something on the left leg last fall what  I would like to get this result. She has a history of MRSA treatment. I do not believe she had reflux studies but she did have DVT rule out studies. Previous ABIs have not suggested arterial insufficiency 4/7; difficult wounds on the left medial ankle probably chronic venous insufficiency. With considerable effort on behalf of our case manager we were able to finally to speak to somebody at the hospital in [place] who indicated that no biopsy of this area have been done even though the patient describes this in some detail and is even able to point out where she thinks the biopsy was done. We have been using Sorbact. The PCR culture I did showed polymicrobial identification with Pseudomonas, staph aureus, Peptostreptococcus. All of this and low titers. Resistance genes identified were MRSA, staph virulence gene and tetracycline. We are going to send this to [facility] for a topical antibiotic which is something that we have had good success with recently in large venous ulcers with a lot of purulent drainage. There would not be an easy oral alternative here possibly line escalated and ciprofloxacin if we need to use systemic antibiotic 4/15; difficult area on the left medial ankle. Most likely chronic venous insufficiency. I think she will probably need venous reflux study I think she had DVT rule outs but not venous reflux studies. We have not yet obtained the topical antibiotics. She has home health changing the dressing we have been using Sorbact for adherent fibrinous debris on the surface. Very difficult to remove 4/22; patient presents for 1 week follow-up. She has been using sore back under compression wraps and these are changed 3 times a week with home health. She also had Keystone antibiotics sent to her house and  brought them in today. She has no complaints or issues today. 5/2; patient is here for follow-up. She has been using Sorbact under compression. Very painful wound. She has been using Keystone antibiotics. Not much improvement although the more medial part of the wound has cleaned up nicely and the larger part of the wound about 50% slough covered. Part of the issue here is that she had stays at both [facility] wound care center, [facility] wound care center and now [place]. Not sure if she has had venous reflux studies. As far as we are able to tell she did not have a biopsy. My notes state that she did not have venous reflux studies just DVT rule outs. 5/16; patient goes for venous reflux studies this afternoon. She says that wound was biopsied which sounds like punch biopsies by Dr. Lynden Ang we do not have these results. We are using Sorbact. Very difficult wound to debride 5/23; patient presents for 1 week follow-up. She reports tolerating the wraps well with sorbact underneath. She had ABIs and venous reflux studies done. She has no issues or complaints today. She denies acute signs of infection. 6/6; I have reviewed the patient's vascular studies. Wounds are on the left medial and posterior calf. She had significant reflux in the greater saphenous vein in the in the mid thigh, distal thigh knee and the small saphenous vein in the popliteal fossa. The vein diameters do not look too impressive though. She is going to see the vascular surgeon on Wednesday. She also had venous reflux in the right common femoral vein. She did not have any evidence of a DVT or SVT I am wondering whether there is an ablation procedure that would benefit her in the greater saphenous vein on the left. . She  tells [place] that home health put the dressing on too tight and she took off 1 layer. The swelling in her left leg is a little worse as a result of this. Not much change in the wound measurements so the surface of the wound looks  better Her arterial studies showed an ABI on the right of 1.14 with a triphasic waveform and a great toe pressure of 0.89. On the left her ABIs were noncompressible at 1.34 but with triphasic waveforms and a TBI of 0.97. Her greater toe pressure was 122. There was no evidence of significant bilateral arterial disease 6/20 patient went to see Dr. Durwin Nora. He did not think she had significant arterial disease. In terms of her venous duplex on the right side there was no evidence of a DVT or SVT there was deep venous reflux involving the common femoral vein no superficial vein reflux on the left side there was no DVT or SVT there was no deep vein graft reflux there was some reflux in the greater saphenous vein from the mid thigh to the knee but the vein here was not dilated. He thought these were venous wounds he prescribed a compression pump but I am not sure who we ordered it from The patient has been approved for Apligraf. Still using silver collagen this week 7/5; Apligraf #1 7/19 Apligraf #2. Decent improvement in the condition of the wound bed. Epithelialization distal 8/2 Apligraf #3. No issues or complaints. Denies signs of infection. 8/17 Apligraf #4. No issues or concerns. Some complaints of pruritus and the rash. Our intake nurse brought up the fact that she had previously indicated possible cotton layer sensitivity we will therefore use kerlix in the bottom layer of the compression 8/30; the patient comes in with the area on her left medial leg just about healed. There is a superficial area more towards the tibia and a smaller open area distally everything else is epithelialized. I do not think she requires another Apligraf 9/13; left medial leg is healed. 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 Her edema control is good Readmisstion: 05-22-2022 upon evaluation today patient presents for initial inspection here in our clinic  concerning issues that she has been having with a wound of the right lateral lower extremity. This is an area of a previous skin graft she tells me. With that being said she unfortunately had a scrape on this that occurred around 10 January. Since that time she has noted that this has just continued to get bigger and bigger in her niece who is present with her today actually states that 2 weeks ago when she saw that it was significantly smaller than what she sees currently. Obviously this is of utmost concern as we do not want this to continue to get larger when arrested and get moving in the right direction. Fortunately there does not appear to be any signs of systemic infection though locally I think we probably do have some infection present. I would obtain a culture to see what we have going on here and then we will subsequently see where things go going forward. Fortunately I think that she is in the right place at this point being at the wound center we can definitely do something to try to get this moving in the right direction. Patient does have a history of chronic venous insufficiency as well as coronary artery disease. In the past she has done well with compression wraps  on the start with a 3 layer wrap I think she is probably can end up going to a 4-layer at some point but we will see how things do over the next week. She has been using wound cleanser along with she tells me a zinc and topical but again I am not sure exactly what that was. 05-29-2022 upon evaluation today patient appears to be doing well currently in regard to her wound. This actually is showing signs of improvement I am happy in that regard unfortunately her left leg has an area on the shin that has opened. This is the region where one of the areas at least we have previously taken care of. Nonetheless this is small hoping we get it under control before things worsen significantly. 06-05-2022 upon evaluation today patient  appears to be doing well currently in regard to her wounds. The actually seem to be fairly clean she is having still quite Jamie [name], Jamie [name] ([id number]) [id number] Page 8 of 11 a bit of problem with pain at this point she would like to not have debridement today for all possible. With that being said I do believe that we are making some good progress I think the [facility] is doing a decent job here. 3/6; patient has had 3/6; patient with known severe chronic venous insufficiency. She apparently had a traumatic wound at home and has a reasonably substantial wound on the right lateral lower leg and a smaller one on the left anterior lower leg as well. We have been using Hydrofera Blue and Unna boots 3/13; patient presents for follow-up. We have been using Hydrofera Blue under Coflex to the legs bilaterally. She has no issues or complaints today. 06-26-2022 upon evaluation today patient appears to be doing well currently in regard to her wound. This is measuring a little bit larger however. Fortunately I do not see any signs of infection this is on the right side. Fortunately the left side is actually completely healed which is great news. 07-03-2022 patient's wound unfortunately is continuing to show signs of being worse. Her compression which was the Tubigrip that I thought should be able to pull up actually slipped down and she was not able to pull that back up. With that being said that means that unfortunately her wound has continued to deteriorate since I last saw her. This is definitely not the direction that we are looking for. I discussed with her that I do believe we need to go ahead and see about getting her started on antibiotics and I subsequently would also like to go ahead and see about getting things moving forward with regard to a wound culture and making a change up her dressings as well but this can be changed more frequently than just once a  week. 07-10-2022 upon evaluation today patient actually appears to be doing much better. I do believe that the antibiotics have been beneficial for her. Fortunately I do not see any signs of active infection locally nor systemically which is great news. No fevers, chills, nausea, vomiting, or diarrhea. 07-17-2022 upon evaluation today patient appears to be doing well currently in regard to her wound which is actually showing signs of improvement this is slow but nonetheless prevalent that we are seeing good improvement here. Fortunately I do not see any signs of active infection locally nor systemically which is great news. 07-24-2022 upon evaluation today patient appears to be doing well currently in regard to her wound although the wrap that we had  on has been slipping down and she really does not have anybody to help her with this at home health is not going to be coming out we could not get anybody that would actually be able to do the dressing changes. Fortunately I do not see any signs of active infection locally nor systemically which is great news. 08-07-22 poorly in regard to her leg compared to what it was previous. Fortunately there does not appear to be any signs of active infection locally nor systemically which is great news. No fevers, chills, nausea, vomiting, or diarrhea. With that being said it does appear that the patient may have some cellulitis in the leg which is not good. 08-14-2022 upon evaluation today patient appears to be doing somewhat better in regard to her wounds she still is having quite a bit of pain we are still not where we want to be as far as healing is concerned completely. Fortunately I do not see any evidence of active infection locally nor systemically which is great news and I am very pleased in that regard. 08-21-23 upon evaluation today patient appears to be doing poorly currently in regard to her wound. She has been tolerating the dressing changes  without complication. Fortunately there does not appear to be any signs of active infection locally nor systemically at this time. 08-28-2022 upon evaluation today patient appears to be doing poorly in regard to her wound this was not wrapped appropriately home health did not go up high enough on the wrap. This has caused some issues and I discussed that with the patient today. I do believe that she is going to require aggressive wrapping and treatment which she is getting the [facility] topical antibiotics today they can start using that at the next wrap on Friday. In the meantime they need to make sure that the rapid and appropriately we wrote very specific orders today. 09-04-2022 upon evaluation today patient appears to be doing well currently in regard to her wound which I think is making progress is still hurting her quite significantly. Fortunately I do not see any signs of active infection locally or systemically which is great news. No fevers, chills, nausea, vomiting, or diarrhea. 09-11-2022 upon evaluation today patient's wound actually is showing signs of being a little bit smaller and looking a little bit better she still has a lot going on here however. Fortunately I do not see any evidence of active infection Worsening locally nor systemically which is great news. With that being said worsening still continue to use the topical Keystone antibiotics. 09-18-2022 upon evaluation today patient actually showing some signs of improvement. I am actually very pleased with where we stand compared to where we have been. I think that she is making good headway here. She is still having quite a bit of pain but it seems to be lessening compared to previous. 09-25-2022 upon evaluation patient's wound actually showing signs slowly but surely of improvement. Fortunately I do not see any signs of active infection at this time systemically and locally I feel like this is dramatically improved. She is doing well  with the [facility] topical antibiotics. 10-02-2022 upon evaluation today patient appears to be doing better in regard to her wound overall. I am very pleased with where things stand from a visual standpoint I do not see any signs of active infection and in general I think that we are moving in the right direction here. 10-09-2022 upon evaluation today patient appears to be doing well currently  in regard to her wound. She has been tolerating the dressing changes without complication. Fortunately I do not see any signs of active infection at this time which is great news. 10-16-2022 upon evaluation today patient continues to have quite a bit of pain in regard to her right leg. We have been attempting to debride little by little as we could but again was pretty limited by the fact that she is having significant discomfort. We do not actually have any signs of active infection going on at this point that the [facility] topical antibiotics have been helping quite readily. I been attempting to do as much debridement as I can but if she were to have pain medication this would actually help and in the past when she did it was much more tolerable for her as far as taking the edge off. Right now she has been without as she is in transition from her previous pain management physician to someone new to manage this. 10-23-2022 upon evaluation patient appears to be doing excellent in regard to her wound. She has been tolerating the dressing changes without complication. Fortunately I do not see any evidence of active infection locally nor systemically which is great news. No fevers, chills, nausea, vomiting, or diarrhea. 7/24; patient with a wound secondary to chronic venous insufficiency on the right lateral lower leg. We have been using Keystone antibiotic Hydrofera Blue under kerlix Coban. She complains of a lot of pain after last week's debridement otherwise things appear to be improved per discussion with our intake  nurse. 11-06-2022 upon evaluation today patient appears to be doing excellent at this point in regard to her wound. She has been tolerating the dressing changes without complication and to be honest she is making excellent progress towards complete closure. I am actually very pleased with where we stand I think that she is doing excellent as far as the overall appearance of the wound is concerned as well. She is going require some debridement however. 11-20-2022 upon evaluation today patient appears to be doing well currently in regard to her wound. She has been tolerating the dressing changes without complication. Fortunately there does not appear to be any signs of active infection locally or systemically which is great news and in general I do believe that we are moving in the right direction here. No fevers, chills, nausea, vomiting, or diarrhea. 11-27-2022 upon evaluation today patient appears to be doing a little better in regards to the overall appearance of her wound but unfortunately she is having increased pain. The collagen seems to be getting very dry and stuck to the wound bed which is causing her some issues here. Fortunately I do not see any signs of active infection locally nor systemically at this time. Fortunately I think that her wound is better but unfortunately her pain has not. For that reason I am going to avoid any debridement today since the patient is very upset about the pain and how bad this is hurting at this point. I think we can have to try something a little bit different I am thinking PolyMem may be a good option. 8/28; this patient has difficult venous wounds on the right lateral lower leg and ankle in the setting of chronic venous insufficiency. We have been using polymen under compression with kerlix Coban. Jamie [name], Jamie [name] ([id number]) [id number] Page 9 of 11 12-11-2022 upon evaluation today patient appears to be doing well currently in  regard to her wound. She has been tolerating the dressing  changes without complication and actually appears to be doing much better this week compared to when I saw her 2 weeks ago. The wound is measuring significantly smaller. We are using PolyMem along with Kerlix and Coban. 12-18-2022 upon evaluation today patient appears to be doing well currently in regard to her wound. This is actually showing signs of improvement is measuring a little bit smaller and looking better. Fortunately I do not see any evidence of worsening overall and I believe that we will making good headway with the PolyMem and the [facility] topical antibiotics. 12-25-2022 upon evaluation today patient appears to be doing well currently in regard to her wound. She has been tolerating the dressing changes without complication. Fortunately I do not see any evidence of infection locally or systemically which is great news and in general I do believe that we will make an excellent headway towards complete closure also excellent news. 01-01-2023 upon evaluation patient's wound actually showing signs of improvement the wrap was actually put on properly this week and this is good news. Overall I am extremely happy with where things stand and how this appears today I do not see any signs of worsening overall and I believe that the patient is making excellent headway towards complete closure which is great news. 01/08/2023 upon evaluation today patient appears to be doing well currently in regard to her leg. She is actually draining much less unfortunately home health is just not doing very well getting the supplies necessary in order to continue to take care of the patient. They are now telling me they cannot get the PolyMem which is something that has been doing really well for her. That really does not make sense to me you came to get PolyMem for a hospice patient. Nonetheless either way I think we may need to just discontinue treatment with  home health and just manage the patient here at the clinic. 01-15-2023 upon evaluation today patient appears to be doing well currently in regard to her wound. She has been tolerating the dressing changes without complication. Fortunately I do not see any signs of infection I think she is doing quite well and very pleased with where we stand today. No fevers, chills, nausea, vomiting, or diarrhea. 01-22-2023 upon evaluation today patient appears to be doing well currently in regard to her wound. She has been tolerating the dressing changes without complication and both wounds are measuring smaller today and look to be doing excellent. I am actually very pleased with what ever standing here and I think that she is making really good headway towards closure which is great news. Objective Constitutional Well-nourished and well-hydrated in no acute distress. Vitals Time Taken: 11:18 AM, Height: 62 in, Weight: 171 lbs, BMI: 31.3, Temperature: 98 F, Pulse: 62 bpm, Respiratory Rate: 18 breaths/min, Blood Pressure: 113/67 mmHg. Respiratory normal breathing without difficulty. Psychiatric this patient is able to make decisions and demonstrates good insight into disease process. Alert and Oriented x 3. pleasant and cooperative. General Notes: Upon inspection patient's wound bed actually showed signs of good granulation and epithelization at this point. Fortunately I see no evidence of worsening overall and I do believe that the patient is making good headway towards complete closure which is great news. Integumentary (Hair, Skin) Wound #5 status is Open. Original cause of wound was Trauma. The date acquired was: 04/17/2022. The wound has been in treatment 35 weeks. The wound is located on the Right,Lateral Lower Leg. The wound measures 5cm length x 3cm width  x 0.1cm depth; 11.781cm^2 area and 1.178cm^3 volume. There is Fat Layer (Subcutaneous Tissue) exposed. There is no tunneling or undermining noted.  There is a medium amount of serosanguineous drainage noted. The wound margin is distinct with the outline attached to the wound base. There is medium (34-66%) red granulation within the wound bed. There is a medium (34-66%) amount of necrotic tissue within the wound bed including Adherent Slough. The periwound skin appearance exhibited: Scarring, Dry/Scaly, Hemosiderin Staining. The periwound skin appearance did not exhibit: Callus, Crepitus, Excoriation, Induration, Rash, Maceration, Atrophie Blanche, Cyanosis, Ecchymosis, Mottled, Pallor, Rubor, Erythema. Periwound temperature was noted as No Abnormality. The periwound has tenderness on palpation. Wound #7 status is Open. Original cause of wound was Gradually Appeared. The date acquired was: 01/01/2023. The wound has been in treatment 3 weeks. The wound is located on the Right,Posterior Lower Leg. The wound measures 3cm length x 3.5cm width x 0.2cm depth; 8.247cm^2 area and 1.649cm^3 volume. There is Fat Layer (Subcutaneous Tissue) exposed. There is no tunneling or undermining noted. There is a medium amount of serosanguineous drainage noted. The wound margin is distinct with the outline attached to the wound base. There is small (1-33%) red, pink granulation within the wound bed. There is a large (67- 100%) amount of necrotic tissue within the wound bed including Adherent Slough. The periwound skin appearance exhibited: Scarring, Hemosiderin Staining. The periwound skin appearance did not exhibit: Callus, Crepitus, Excoriation, Induration, Rash, Dry/Scaly, Maceration, Atrophie Blanche, Cyanosis, Ecchymosis, Mottled, Pallor, Rubor, Erythema. Periwound temperature was noted as No Abnormality. The periwound has tenderness on palpation. 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 Leonard Schwartz Wednesday 01/29/2023 1030 (already scheduled) Return Appointment in 2 weeks. Leonard Schwartz Wednesday (already scheduled) Return appointment in 3 weeks. Leonard Schwartz Wednesday (already scheduled) Return appointment in 1 month. Leonard Schwartz Wednesday (already scheduled) Anesthetic: Wound #5 Right,Lateral Lower Leg: (In clinic) Topical Lidocaine 4% applied to wound bed Bathing/ Shower/ Hygiene: May shower and wash wound with soap and water. - with dressing changes. Edema Control - Lymphedema / SCD / Other: Elevate legs to the level of the heart or above for 30 minutes daily and/or when sitting for 3-4 times a day throughout the day. Avoid standing for long periods of time. Exercise regularly WOUND #5: - Lower Leg Wound Laterality: Right, Lateral Cleanser: Soap and Water 1 x Per Week/30 Days Discharge Instructions: May shower and wash wound with dial antibacterial soap and water prior to dressing change. Peri-Wound Care: Zinc Oxide Ointment 30g tube 1 x Per Week/30 Days Discharge Instructions: Apply Zinc Oxide to periwound with each dressing change TO ANY MACERATED SKIN. Peri-Wound Care: Sween Lotion (Moisturizing lotion) 1 x Per Week/30 Days Discharge Instructions: Apply moisturizing lotion as directed Topical: compounding topical antibiotics 1 x Per Week/30 Days Discharge Instructions: apply Keystone directly to wound bed under the hydrofera blue. Home Health start. MIX ACCORDING TO THE PHARMACY INSTRUCTIONS. Prim Dressing: PolyMem Non-Adhesive Dressing, 4x4 in 1 x Per Week/30 Days ary Discharge Instructions: ******CUT SLITS INTO THE POLYMEM.***** Apply to wound bed as instructed Secondary Dressing: ABD Pad, 8x10 1 x Per Week/30 Days Discharge Instructions: Apply over  primary dressing as directed. Secondary Dressing: Woven Gauze Sponge, Non-Sterile  4x4 in 1 x Per Week/30 Days Discharge Instructions: Apply over primary dressing as directed. Secondary Dressing: Zetuvit Plus 4x8 in 1 x Per Week/30 Days Discharge Instructions: Or double ABD pad Apply over primary dressing as directed. Com pression Wrap: Kerlix Roll 4.5x3.1 (in/yd) 1 x Per Week/30 Days Discharge Instructions: Apply Kerlix and Coban compression as directed. Com pression Wrap: Coban Self-Adherent Wrap 4x5 (in/yd) 1 x Per Week/30 Days Discharge Instructions: Apply over Kerlix as directed. Com pression Wrap: unna boot FIRST LAYER **** SEE INSTRUCTIONS 1 x Per Week/30 Days Discharge Instructions: APPLY FIRST LAYER UNNA BOOT AT BASES OF TOES AND JUST BELOW THE KNEE TO HOLD COMPRESSION WRAP IN PLACE. WOUND #7: - Lower Leg Wound Laterality: Right, Posterior Cleanser: Soap and Water 1 x Per Week/30 Days Discharge Instructions: May shower and wash wound with dial antibacterial soap and water prior to dressing change. Peri-Wound Care: Zinc Oxide Ointment 30g tube 1 x Per Week/30 Days Discharge Instructions: Apply Zinc Oxide to periwound with each dressing change TO ANY MACERATED SKIN. Peri-Wound Care: Sween Lotion (Moisturizing lotion) 1 x Per Week/30 Days Discharge Instructions: Apply moisturizing lotion as directed Topical: compounding topical antibiotics 1 x Per Week/30 Days Discharge Instructions: apply Keystone directly to wound bed under the hydrofera blue. Home Health start. MIX ACCORDING TO THE PHARMACY INSTRUCTIONS. Prim Dressing: PolyMem Non-Adhesive Dressing, 4x4 in 1 x Per Week/30 Days ary Discharge Instructions: ******CUT SLITS INTO THE POLYMEM.***** Apply to wound bed as instructed Secondary Dressing: ABD Pad, 8x10 1 x Per Week/30 Days Discharge Instructions: Apply over primary dressing as directed. Secondary Dressing: Woven Gauze Sponge, Non-Sterile 4x4 in 1 x Per Week/30 Days Discharge  Instructions: Apply over primary dressing as directed. Secondary Dressing: Zetuvit Plus 4x8 in 1 x Per Week/30 Days Discharge Instructions: Or double ABD pad Apply over primary dressing as directed. Com pression Wrap: Kerlix Roll 4.5x3.1 (in/yd) 1 x Per Week/30 Days Discharge Instructions: Apply Kerlix and Coban compression as directed. Com pression Wrap: Coban Self-Adherent Wrap 4x5 (in/yd) 1 x Per Week/30 Days Discharge Instructions: Apply over Kerlix as directed. Com pression Wrap: unna boot FIRST LAYER **** SEE INSTRUCTIONS 1 x Per Week/30 Days Discharge Instructions: APPLY FIRST LAYER UNNA BOOT AT BASES OF TOES AND JUST BELOW THE KNEE TO HOLD COMPRESSION WRAP IN PLACE. 1. I am going to recommend that we have the patient continue to monitor for any signs of infection or worsening. Overall I do believe that we are doing well here and I am very pleased with where things stand. 2. I am going to recommend that the patient should continue with the Zetuvit bordered foam dressing along with the roll gauze to secure in place. We then utilizing the Curlex and Coban wrap which is doing a really good job. This is all over top of the PolyMem which I think is doing a really good job with the [facility] topical antibiotics. We will see patient back for reevaluation in 1 week here in the clinic. If anything worsens or changes patient will contact our office for additional recommendations. Electronic Signature(s) Signed: 01/22/2023 11:47:08 AM By: Allen Derry PA-C Entered By: Allen Derry on 01/22/2023 11:47:08 Jamie [name] ([id number]) [id number] Page 11 of 11 -------------------------------------------------------------------------------- SuperBill Details Patient Name: Date of Service: Jamie [name], [place] 01/22/2023 Medical Record Number: [id number] Patient Account Number: [id number] Date of Birth/Sex: Treating RN: 01/04/1941 (82 y.o. F) Primary Care Provider:  Belva Agee Other Clinician: Referring Provider: Treating Provider/Extender: [name], [name] in Treatment:  35 Diagnosis Coding ICD-10 Codes Code Description I87.331 Chronic venous hypertension (idiopathic) with ulcer and inflammation of right lower extremity 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d I25.10 Atherosclerotic heart disease of native coronary artery without angina pectoris Facility Procedures : CPT4 Code: 16109604 Description: 99213 - WOUND CARE VISIT-LEV 3 EST PT Modifier: Quantity: 1 Physician Procedures : CPT4 Code Description Modifier [id number] - WC PHYS LEVEL 3 - EST PT ICD-10 Diagnosis Description I87.331 Chronic venous hypertension (idiopathic) with ulcer and inflammation of right lower extremity L97.812 Non-pressure chronic ulcer of other part  of right lower leg with fat layer exposed I25.10 Atherosclerotic heart disease of native coronary artery without angina pectoris Quantity: 1 Electronic Signature(s) Signed: 01/22/2023 4:26:00 PM By: Allen Derry PA-C Signed: 02/03/2023 4:05:37 PM By: Redmond Pulling RN, BSN Previous Signature: 01/22/2023 11:47:20 AM Version By: Allen Derry PA-C Entered By: Redmond Pulling on 01/22/2023 11:59:18

## 2023-01-29 ENCOUNTER — Encounter (HOSPITAL_BASED_OUTPATIENT_CLINIC_OR_DEPARTMENT_OTHER): Payer: Medicare HMO | Admitting: Physician Assistant

## 2023-01-29 DIAGNOSIS — L97812 Non-pressure chronic ulcer of other part of right lower leg with fat layer exposed: Secondary | ICD-10-CM | POA: Diagnosis not present

## 2023-01-29 NOTE — Progress Notes (Addendum)
Jamie [name] ([id number]) [id number] Page 1 of 11 Visit Report for 01/29/2023 Chief Complaint Document Details Patient Name: Date of Service: Jamie [name], [name]. 01/29/2023 10:30 A M Medical Record Number: [id number] Patient Account Number: [id number] Date of Birth/Sex: Treating RN: 03-Mar-1941 (82 y.o. F) Primary Care Provider: Belva Agee Other Clinician: Referring Provider: Treating Provider/Extender: [name], [name] in Treatment: 36 Information Obtained from: Patient Chief Complaint Right LE Ulcer Electronic Signature(s) Signed: 01/29/2023 10:40:26 AM By: Allen Derry PA-C Entered By: Allen Derry on 01/29/2023 07:40:26 -------------------------------------------------------------------------------- HPI Details Patient Name: Date of Service: Jamie [name], Jamie [name] 01/29/2023 10:30 A M Medical Record Number: [id number] Patient Account Number: [id number] Date of Birth/Sex: Treating RN: May 18, 1940 (82 y.o. F) Primary Care Provider: Belva Agee Other Clinician: Referring Provider: Treating Provider/Extender: [name], [name] in Treatment: 36 History of Present Illness HPI Description: ADMISSION 06/30/2020 Jamie [name] is an 82 year old woman who lives in [place]. She is here with her niece for review of wounds on the left medial lower leg and ankle. These have apparently been present for over a year and she followed with Dr. Olegario Messier at the [facility] in [place] for quite a period of time although it looks as though there was a initial consult wound from Dr. Marcha Solders on February 18 presumably there was therefore hiatus. At that point the wounds were described as being there for 3 months. She also tells me she was at the wound care center in [place] for a period of time with this. 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 She had  venous studies that were negative for DVT ABIs on the right were 1.01 on the left 1.06. She has . had previous applications of puraply, compression which she does not tolerate very well. She has had several rounds of oral antibiotic therapy. She complains of unrelenting pain and she is seeing Dr. Reece Agar V of pain management apparently was on oxycodone but that did not help. She also had a skin biopsy done by Dr. Olegario Messier although we do not have that result. She does not appear to have an arterial issue. I am not completely clear what she has been putting on the wounds lately. 3/31; this patient has a particularly nasty set of wounds on the left medial ankle in the middle of what looks to be hemosiderin deposition secondary to chronic venous insufficiency. She has a lot of pain followed by pain management. Dr. Marcha Solders apparently did a biopsy of something on the left leg last fall what I would like to get this result. She has a history of MRSA treatment. I do not believe she had reflux studies but she did have DVT rule out studies. Previous ABIs have not suggested arterial insufficiency 4/7; difficult wounds on the left medial ankle probably chronic venous insufficiency. With considerable effort on behalf of our case manager we were able to finally to speak to somebody at the hospital in [place] who indicated that no biopsy of this area have been done even though the patient describes this in some detail and is even able to point out where she thinks the biopsy was done. We have been using Sorbact. The PCR culture I did showed polymicrobial identification with Pseudomonas, staph aureus, Peptostreptococcus. All of this and low titers. Resistance genes identified were MRSA, staph virulence gene and tetracycline. We are going to send this to [facility] for a topical antibiotic which is something  that we have had good success with recently in large venous ulcers with a lot of purulent drainage. There would not be an  easy oral alternative here possibly line escalated and ciprofloxacin if we need to use systemic antibiotic 4/15; difficult area on the left medial ankle. Most likely chronic venous insufficiency. I think she will probably need venous reflux study I think she had DVT rule outs but not venous reflux studies. We have not yet obtained the topical antibiotics. She has home health changing the dressing we have been using Sorbact for adherent fibrinous debris on the surface. Very difficult to remove ADEANA, JAGLOWSKI ([id number]) [id number] Page 2 of 11 4/22; patient presents for 1 week follow-up. She has been using sore back under compression wraps and these are changed 3 times a week with home health. She also had Keystone antibiotics sent to her house and brought them in today. She has no complaints or issues today. 5/2; patient is here for follow-up. She has been using Sorbact under compression. Very painful wound. She has been using Keystone antibiotics. Not much improvement although the more medial part of the wound has cleaned up nicely and the larger part of the wound about 50% slough covered. Part of the issue here is that she had stays at both [facility] wound care center, [facility] wound care center and now [place]. Not sure if she has had venous reflux studies. As far as we are able to tell she did not have a biopsy. My notes state that she did not have venous reflux studies just DVT rule outs. 5/16; patient goes for venous reflux studies this afternoon. She says that wound was biopsied which sounds like punch biopsies by Dr. Lynden Ang we do not have these results. We are using Sorbact. Very difficult wound to debride 5/23; patient presents for 1 week follow-up. She reports tolerating the wraps well with sorbact underneath. She had ABIs and venous reflux studies done. She has no issues or complaints today. She denies acute signs of infection. 6/6; I have reviewed the patient's  vascular studies. Wounds are on the left medial and posterior calf. She had significant reflux in the greater saphenous vein in the in the mid thigh, distal thigh knee and the small saphenous vein in the popliteal fossa. The vein diameters do not look too impressive though. She is going to see the vascular surgeon on Wednesday. She also had venous reflux in the right common femoral vein. She did not have any evidence of a DVT or SVT I am wondering whether there is an ablation procedure that would benefit her in the greater saphenous vein on the left. . She tells [place] that home health put the dressing on too tight and she took off 1 layer. The swelling in her left leg is a little worse as a result of this. Not much change in the wound measurements so the surface of the wound looks better Her arterial studies showed an ABI on the right of 1.14 with a triphasic waveform and a great toe pressure of 0.89. On the left her ABIs were noncompressible at 1.34 but with triphasic waveforms and a TBI of 0.97. Her greater toe pressure was 122. There was no evidence of significant bilateral arterial disease 6/20 patient went to see Dr. Durwin Nora. He did not think she had significant arterial disease. In terms of her venous duplex on the right side there was no evidence of a DVT or SVT there was deep venous reflux involving the common  femoral vein no superficial vein reflux on the left side there was no DVT or SVT there was no deep vein graft reflux there was some reflux in the greater saphenous vein from the mid thigh to the knee but the vein here was not dilated. He thought these were venous wounds he prescribed a compression pump but I am not sure who we ordered it from The patient has been approved for Apligraf. Still using silver collagen this week 7/5; Apligraf #1 7/19 Apligraf #2. Decent improvement in the condition of the wound bed. Epithelialization distal 8/2 Apligraf #3. No issues or complaints. Denies signs of  infection. 8/17 Apligraf #4. No issues or concerns. Some complaints of pruritus and the rash. Our intake nurse brought up the fact that she had previously indicated possible cotton layer sensitivity we will therefore use kerlix in the bottom layer of the compression 8/30; the patient comes in with the area on her left medial leg just about healed. There is a superficial area more towards the tibia and a smaller open area distally everything else is epithelialized. I do not think she requires another Apligraf 9/13; left medial leg is healed. 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 Her edema control is good Readmisstion: 05-22-2022 upon evaluation today patient presents for initial inspection here in our clinic concerning issues that she has been having with a wound of the right lateral lower extremity. This is an area of a previous skin graft she tells me. With that being said she unfortunately had a scrape on this that occurred around 10 January. Since that time she has noted that this has just continued to get bigger and bigger in her niece who is present with her today actually states that 2 weeks ago when she saw that it was significantly smaller than what she sees currently. Obviously this is of utmost concern as we do not want this to continue to get larger when arrested and get moving in the right direction. Fortunately there does not appear to be any signs of systemic infection though locally I think we probably do have some infection present. I would obtain a culture to see what we have going on here and then we will subsequently see where things go going forward. Fortunately I think that she is in the right place at this point being at the wound center we can definitely do something to try to get this moving in the right direction. Patient does have a history of chronic venous insufficiency as well as coronary artery disease. In  the past she has done well with compression wraps on the start with a 3 layer wrap I think she is probably can end up going to a 4-layer at some point but we will see how things do over the next week. She has been using wound cleanser along with she tells me a zinc and topical but again I am not sure exactly what that was. 05-29-2022 upon evaluation today patient appears to be doing well currently in regard to her wound. This actually is showing signs of improvement I am happy in that regard unfortunately her left leg has an area on the shin that has opened. This is the region where one of the areas at least we have previously taken care of. Nonetheless this is small hoping we get it under control before things worsen significantly. 06-05-2022 upon evaluation today patient appears to be doing well currently in regard  to her wounds. The actually seem to be fairly clean she is having still quite a bit of problem with pain at this point she would like to not have debridement today for all possible. With that being said I do believe that we are making some good progress I think the [facility] is doing a decent job here. 3/6; patient has had 3/6; patient with known severe chronic venous insufficiency. She apparently had a traumatic wound at home and has a reasonably substantial wound on the right lateral lower leg and a smaller one on the left anterior lower leg as well. We have been using Hydrofera Blue and Unna boots 3/13; patient presents for follow-up. We have been using Hydrofera Blue under Coflex to the legs bilaterally. She has no issues or complaints today. 06-26-2022 upon evaluation today patient appears to be doing well currently in regard to her wound. This is measuring a little bit larger however. Fortunately I do not see any signs of infection this is on the right side. Fortunately the left side is actually completely healed which is great news. 07-03-2022 patient's wound unfortunately is  continuing to show signs of being worse. Her compression which was the Tubigrip that I thought should be able to pull up actually slipped down and she was not able to pull that back up. With that being said that means that unfortunately her wound has continued to deteriorate since I last saw her. This is definitely not the direction that we are looking for. I discussed with her that I do believe we need to go ahead and see about getting her started on antibiotics and I subsequently would also like to go ahead and see about getting things moving forward with regard to a wound culture and making a change up her dressings as well but this can be changed more frequently than just once a week. 07-10-2022 upon evaluation today patient actually appears to be doing much better. I do believe that the antibiotics have been beneficial for her. Fortunately I do not see any signs of active infection locally nor systemically which is great news. No fevers, chills, nausea, vomiting, or diarrhea. 07-17-2022 upon evaluation today patient appears to be doing well currently in regard to her wound which is actually showing signs of improvement this is slow but nonetheless prevalent that we are seeing good improvement here. Fortunately I do not see any signs of active infection locally nor systemically which is great news. 07-24-2022 upon evaluation today patient appears to be doing well currently in regard to her wound although the wrap that we had on has been slipping down and she really does not have anybody to help her with this at home health is not going to be coming out we could not get anybody that would actually be able to do the dressing changes. Fortunately I do not see any signs of active infection locally nor systemically which is great news. 08-07-22 poorly in regard to her leg compared to what it was previous. Fortunately there does not appear to be any signs of active infection locally nor systemically which is  great news. No fevers, chills, nausea, vomiting, or diarrhea. With that being said it does appear that the patient may have some cellulitis in the leg which is not good. 08-14-2022 upon evaluation today patient appears to be doing somewhat better in regard to her wounds she still is having quite a bit of pain we are still not where we want to be as  far as healing is concerned completely. Fortunately I do not see any evidence of active infection locally nor systemically which is great news and I am very pleased in that regard. REXANN, MARCHESANI ([id number]) [id number] Page 3 of 11 08-21-23 upon evaluation today patient appears to be doing poorly currently in regard to her wound. She has been tolerating the dressing changes without complication. Fortunately there does not appear to be any signs of active infection locally nor systemically at this time. 08-28-2022 upon evaluation today patient appears to be doing poorly in regard to her wound this was not wrapped appropriately home health did not go up high enough on the wrap. This has caused some issues and I discussed that with the patient today. I do believe that she is going to require aggressive wrapping and treatment which she is getting the [facility] topical antibiotics today they can start using that at the next wrap on Friday. In the meantime they need to make sure that the rapid and appropriately we wrote very specific orders today. 09-04-2022 upon evaluation today patient appears to be doing well currently in regard to her wound which I think is making progress is still hurting her quite significantly. Fortunately I do not see any signs of active infection locally or systemically which is great news. No fevers, chills, nausea, vomiting, or diarrhea. 09-11-2022 upon evaluation today patient's wound actually is showing signs of being a little bit smaller and looking a little bit better she still has a lot going on  here however. Fortunately I do not see any evidence of active infection Worsening locally nor systemically which is great news. With that being said worsening still continue to use the topical Keystone antibiotics. 09-18-2022 upon evaluation today patient actually showing some signs of improvement. I am actually very pleased with where we stand compared to where we have been. I think that she is making good headway here. She is still having quite a bit of pain but it seems to be lessening compared to previous. 09-25-2022 upon evaluation patient's wound actually showing signs slowly but surely of improvement. Fortunately I do not see any signs of active infection at this time systemically and locally I feel like this is dramatically improved. She is doing well with the [facility] topical antibiotics. 10-02-2022 upon evaluation today patient appears to be doing better in regard to her wound overall. I am very pleased with where things stand from a visual standpoint I do not see any signs of active infection and in general I think that we are moving in the right direction here. 10-09-2022 upon evaluation today patient appears to be doing well currently in regard to her wound. She has been tolerating the dressing changes without complication. Fortunately I do not see any signs of active infection at this time which is great news. 10-16-2022 upon evaluation today patient continues to have quite a bit of pain in regard to her right leg. We have been attempting to debride little by little as we could but again was pretty limited by the fact that she is having significant discomfort. We do not actually have any signs of active infection going on at this point that the [facility] topical antibiotics have been helping quite readily. I been attempting to do as much debridement as I can but if she were to have pain medication this would actually help and in the past when she did it was much more tolerable for her as far as  taking the edge off.  Right now she has been without as she is in transition from her previous pain management physician to someone new to manage this. 10-23-2022 upon evaluation patient appears to be doing excellent in regard to her wound. She has been tolerating the dressing changes without complication. Fortunately I do not see any evidence of active infection locally nor systemically which is great news. No fevers, chills, nausea, vomiting, or diarrhea. 7/24; patient with a wound secondary to chronic venous insufficiency on the right lateral lower leg. We have been using Keystone antibiotic Hydrofera Blue under kerlix Coban. She complains of a lot of pain after last week's debridement otherwise things appear to be improved per discussion with our intake nurse. 11-06-2022 upon evaluation today patient appears to be doing excellent at this point in regard to her wound. She has been tolerating the dressing changes without complication and to be honest she is making excellent progress towards complete closure. I am actually very pleased with where we stand I think that she is doing excellent as far as the overall appearance of the wound is concerned as well. She is going require some debridement however. 11-20-2022 upon evaluation today patient appears to be doing well currently in regard to her wound. She has been tolerating the dressing changes without complication. Fortunately there does not appear to be any signs of active infection locally or systemically which is great news and in general I do believe that we are moving in the right direction here. No fevers, chills, nausea, vomiting, or diarrhea. 11-27-2022 upon evaluation today patient appears to be doing a little better in regards to the overall appearance of her wound but unfortunately she is having increased pain. The collagen seems to be getting very dry and stuck to the wound bed which is causing her some issues here. Fortunately I do not see  any signs of active infection locally nor systemically at this time. Fortunately I think that her wound is better but unfortunately her pain has not. For that reason I am going to avoid any debridement today since the patient is very upset about the pain and how bad this is hurting at this point. I think we can have to try something a little bit different I am thinking PolyMem may be a good option. 8/28; this patient has difficult venous wounds on the right lateral lower leg and ankle in the setting of chronic venous insufficiency. We have been using polymen under compression with kerlix Coban. 12-11-2022 upon evaluation today patient appears to be doing well currently in regard to her wound. She has been tolerating the dressing changes without complication and actually appears to be doing much better this week compared to when I saw her 2 weeks ago. The wound is measuring significantly smaller. We are using PolyMem along with Kerlix and Coban. 12-18-2022 upon evaluation today patient appears to be doing well currently in regard to her wound. This is actually showing signs of improvement is measuring a little bit smaller and looking better. Fortunately I do not see any evidence of worsening overall and I believe that we will making good headway with the PolyMem and the [facility] topical antibiotics. 12-25-2022 upon evaluation today patient appears to be doing well currently in regard to her wound. She has been tolerating the dressing changes without complication. Fortunately I do not see any evidence of infection locally or systemically which is great news and in general I do believe that we will make an excellent headway towards complete closure also excellent  news. 01-01-2023 upon evaluation patient's wound actually showing signs of improvement the wrap was actually put on properly this week and this is good news. Overall I am extremely happy with where things stand and how this appears today I do not  see any signs of worsening overall and I believe that the patient is making excellent headway towards complete closure which is great news. 01/08/2023 upon evaluation today patient appears to be doing well currently in regard to her leg. She is actually draining much less unfortunately home health is just not doing very well getting the supplies necessary in order to continue to take care of the patient. They are now telling me they cannot get the PolyMem which is something that has been doing really well for her. That really does not make sense to me you came to get PolyMem for a hospice patient. Nonetheless either way I think we may need to just discontinue treatment with home health and just manage the patient here at the clinic. 01-15-2023 upon evaluation today patient appears to be doing well currently in regard to her wound. She has been tolerating the dressing changes without complication. Fortunately I do not see any signs of infection I think she is doing quite well and very pleased with where we stand today. No fevers, chills, nausea, vomiting, or diarrhea. 01-22-2023 upon evaluation today patient appears to be doing well currently in regard to her wound. She has been tolerating the dressing changes without complication and both wounds are measuring smaller today and look to be doing excellent. I am actually very pleased with what ever standing here and I think that she is making really good headway towards closure which is great news. 01-29-2023 upon evaluation today patient actually tells me she has been having some increased pain. Subsequently it took a little bit of drilling down but in the end I realized that we actually ended up putting a full Unna boot on her last week as opposed to just using a good anchor at the top and bottom. I think this is what wound up with her having increased discomfort because she tells me as she was doing well when she came in last time and by the time she  left she tells me this was already getting significantly worse. And she hurt most of the week. With that being said I do believe that the patient is doing quite well at this point with regard to her wounds and the wrap did okay it was not a problem other than the increased pain I think we may benefit from a Urgo K2 light compression JASEMINE, FORNI ([id number]) [id number] Page 4 of 11 wrap as this will be much more comfortable I think compared to what we have been doing with just the Kerlix Coban anyway. Electronic Signature(s) Signed: 01/29/2023 11:24:59 AM By: Allen Derry PA-C Entered By: Allen Derry on 01/29/2023 08:24:58 -------------------------------------------------------------------------------- Physical Exam Details Patient Name: Date of Service: [name] 01/29/2023 10:30 A M Medical Record Number: [id number] Patient Account Number: [id number] Date of Birth/Sex: Treating RN: [birth year] (82 y.o. F) Primary Care Provider: Belva Agee Other Clinician: Referring Provider: Treating Provider/Extender: [name], [name] in Treatment: 30 Constitutional Well-nourished and well-hydrated in no acute distress. Respiratory normal breathing without difficulty. Psychiatric this patient is able to make decisions and demonstrates good insight into disease process. Alert and Oriented x 3. pleasant and cooperative. Notes Upon inspection patient's wound bed actually showed signs of good granulation  epithelization at this point. Fortunately I do not see any signs of active infection locally or systemically which is great news and in general I do think there were making good headway towards closure. No fevers, chills, nausea, vomiting, or diarrhea. Electronic Signature(s) Signed: 01/29/2023 11:25:17 AM By: Allen Derry PA-C Entered By: Allen Derry on 01/29/2023  08:25:17 -------------------------------------------------------------------------------- Physician Orders Details Patient Name: Date of Service: Jamie [name], Jamie [name] 01/29/2023 10:30 A M Medical Record Number: [id number] Patient Account Number: [id number] Date of Birth/Sex: Treating RN: April 12, 1940 (82 y.o. 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 Leonard Schwartz Wednesday 02/05/2023 10:30am (already scheduled) Return A ppointment in 2 weeks. Leonard Schwartz Wednesday (already scheduled) Return A Return appointment in 3 weeks. Leonard Schwartz Wednesday (already scheduled) Return appointment in 1 month. Leonard Schwartz Wednesday (already scheduled) REAGYN, PIAZZA ([id number]) [id number] Page 5 of 11 Anesthetic Wound #5 Right,Lateral Lower Leg (In clinic) Topical Lidocaine 4% applied to wound bed Bathing/ Shower/ Hygiene May shower and wash wound with soap and water. - with dressing changes. Edema Control - Lymphedema / SCD / Other Right Lower Extremity Elevate legs to the level of the heart or above for 30 minutes daily and/or when sitting for 3-4 times a day throughout the day. Avoid standing for long periods of time. Exercise regularly Wound Treatment Wound #5 - Lower Leg Wound Laterality: Right, Lateral Cleanser: Soap and Water 1 x Per Week/30 Days Discharge Instructions: May shower and wash wound with dial antibacterial soap and water prior  to dressing change. Peri-Wound Care: Sween Lotion (Moisturizing lotion) 1 x Per Week/30 Days Discharge Instructions: Apply moisturizing lotion as directed Topical: compounding topical antibiotics 1 x Per Week/30 Days Discharge Instructions: apply Keystone directly to wound bed under the hydrofera blue. Home Health start. MIX ACCORDING TO THE PHARMACY INSTRUCTIONS. Prim Dressing: PolyMem Non-Adhesive Dressing, 4x4 in 1 x Per Week/30 Days ary Discharge Instructions: ******CUT SLITS INTO THE POLYMEM.***** Apply to wound bed as instructed Secondary Dressing: ABD Pad, 8x10 1 x Per Week/30 Days Discharge Instructions: Apply over primary dressing as directed. Secondary Dressing: Woven Gauze Sponge, Non-Sterile 4x4 in 1 x Per Week/30 Days Discharge Instructions: Apply over primary dressing as directed. Secondary Dressing: Zetuvit Plus 4x8 in 1 x Per Week/30 Days Discharge Instructions: Or double ABD pad Apply over primary dressing as directed. Compression Wrap: Urgo K2 Lite, (equivalent to a 3 layer) two layer compression system, regular 1 x Per Week/30 Days Discharge Instructions: Apply Urgo K2 Lite as directed (alternative to 3 layer compression). Compression Wrap: unna boot FIRST LAYER **** SEE INSTRUCTIONS 1 x Per Week/30 Days Discharge Instructions: APPLY FIRST LAYER UNNA BOOT AT BASES OF TOES AND JUST BELOW THE KNEE TO HOLD COMPRESSION WRAP IN PLACE. Wound #7 - Lower Leg Wound Laterality: Right, Posterior Cleanser: Soap and Water 1 x Per Week/30 Days Discharge Instructions: May shower and wash wound with dial antibacterial soap and water prior to dressing change. Peri-Wound Care: Sween Lotion (Moisturizing lotion) 1 x Per Week/30 Days Discharge Instructions: Apply moisturizing lotion as directed Topical: compounding topical antibiotics 1 x Per  Week/30 Days Discharge Instructions: apply Jodie Echevaria directly to wound bed under the hydrofera blue. Home Health start. MIX ACCORDING TO THE  PHARMACY INSTRUCTIONS. Prim Dressing: PolyMem Non-Adhesive Dressing, 4x4 in 1 x Per Week/30 Days ary Discharge Instructions: ******CUT SLITS INTO THE POLYMEM.***** Apply to wound bed as instructed Secondary Dressing: ABD Pad, 8x10 1 x Per Week/30 Days Discharge Instructions: Apply over primary dressing as directed. Secondary Dressing: Woven Gauze Sponge, Non-Sterile 4x4 in 1 x Per Week/30 Days Discharge Instructions: Apply over primary dressing as directed. Secondary Dressing: Zetuvit Plus 4x8 in 1 x Per Week/30 Days Discharge Instructions: Or double ABD pad Apply over primary dressing as directed. Compression Wrap: Urgo K2 Lite, (equivalent to a 3 layer) two layer compression system, regular 1 x Per Week/30 Days Discharge Instructions: Apply Urgo K2 Lite as directed (alternative to 3 layer compression). Compression Wrap: unna boot FIRST LAYER **** SEE INSTRUCTIONS 1 x Per Week/30 Days Discharge Instructions: APPLY FIRST LAYER UNNA BOOT AT BASES OF TOES AND JUST BELOW THE KNEE TO HOLD COMPRESSION WRAP IN PLACE. Electronic Signature(s) ELAYNAH, ARING ([id number]) [id number] Page 6 of 11 Signed: 01/29/2023 2:51:19 PM By: Karie Schwalbe RN Signed: 01/29/2023 4:45:44 PM By: Allen Derry PA-C Entered By: Karie Schwalbe on 01/29/2023 08:15:39 -------------------------------------------------------------------------------- Problem List Details Patient Name: Date of Service: Jamie [name], Jamie [name] 01/29/2023 10:30 A M Medical Record Number: [id number] Patient Account Number: [id number] Date of Birth/Sex: Treating RN: 05-Sep-1940 (82 y.o. 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 01/29/2023 10:30 A M Medical Record Number: [id number] Patient Account Number: [id number] Date of Birth/Sex: Treating RN: Oct 02, 1940 (82 y.o. F) Primary Care Provider: Belva Agee Other Clinician: Referring Provider: Treating Provider/Extender: [name], [name] in Treatment: 36 Subjective Chief Complaint Information obtained from Patient Right LE Ulcer History of Present Illness (HPI) ADMISSION ADAUGO, KONOPACKI ([id number]) [id number] Page 7 of 11 06/30/2020 Mrs. Hadorn is an 82 year old woman who lives in [place]. She is here with her niece for review of wounds on the left medial lower leg and ankle. These have apparently been present for over a year and she followed with Dr. Olegario Messier at the [facility] in [place] for quite a period of time although it looks as though there was a initial consult wound from Dr. Marcha Solders on February 18 presumably there was therefore hiatus. At that point the wounds were described as being there for 3 months. She also tells me she was at the wound care center in [place] for a period of time with this. 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 She had venous studies that were negative for DVT ABIs on the right were 1.01 on the left 1.06. She has . had previous applications of  puraply, compression which she does not tolerate very well. She has had several rounds of oral antibiotic  therapy. She complains of unrelenting pain and she is seeing Dr. Reece Agar V of pain management apparently was on oxycodone but that did not help. She also had a skin biopsy done by Dr. Olegario Messier although we do not have that result. She does not appear to have an arterial issue. I am not completely clear what she has been putting on the wounds lately. 3/31; this patient has a particularly nasty set of wounds on the left medial ankle in the middle of what looks to be hemosiderin deposition secondary to chronic venous insufficiency. She has a lot of pain followed by pain management. Dr. Marcha Solders apparently did a biopsy of something on the left leg last fall what I would like to get this result. She has a history of MRSA treatment. I do not believe she had reflux studies but she did have DVT rule out studies. Previous ABIs have not suggested arterial insufficiency 4/7; difficult wounds on the left medial ankle probably chronic venous insufficiency. With considerable effort on behalf of our case manager we were able to finally to speak to somebody at the hospital in [place] who indicated that no biopsy of this area have been done even though the patient describes this in some detail and is even able to point out where she thinks the biopsy was done. We have been using Sorbact. The PCR culture I did showed polymicrobial identification with Pseudomonas, staph aureus, Peptostreptococcus. All of this and low titers. Resistance genes identified were MRSA, staph virulence gene and tetracycline. We are going to send this to [facility] for a topical antibiotic which is something that we have had good success with recently in large venous ulcers with a lot of purulent drainage. There would not be an easy oral alternative here possibly line escalated and ciprofloxacin if we need to use systemic antibiotic 4/15; difficult area  on the left medial ankle. Most likely chronic venous insufficiency. I think she will probably need venous reflux study I think she had DVT rule outs but not venous reflux studies. We have not yet obtained the topical antibiotics. She has home health changing the dressing we have been using Sorbact for adherent fibrinous debris on the surface. Very difficult to remove 4/22; patient presents for 1 week follow-up. She has been using sore back under compression wraps and these are changed 3 times a week with home health. She also had Keystone antibiotics sent to her house and brought them in today. She has no complaints or issues today. 5/2; patient is here for follow-up. She has been using Sorbact under compression. Very painful wound. She has been using Keystone antibiotics. Not much improvement although the more medial part of the wound has cleaned up nicely and the larger part of the wound about 50% slough covered. Part of the issue here is that she had stays at both [facility] wound care center, [facility] wound care center and now [place]. Not sure if she has had venous reflux studies. As far as we are able to tell she did not have a biopsy. My notes state that she did not have venous reflux studies just DVT rule outs. 5/16; patient goes for venous reflux studies this afternoon. She says that wound was biopsied which sounds like punch biopsies by Dr. Lynden Ang we do not have these results. We are using Sorbact. Very difficult wound to debride 5/23; patient presents for 1 week follow-up. She reports tolerating the wraps well with sorbact underneath. She had ABIs and venous reflux studies done. She  has no issues or complaints today. She denies acute signs of infection. 6/6; I have reviewed the patient's vascular studies. Wounds are on the left medial and posterior calf. She had significant reflux in the greater saphenous vein in the in the mid thigh, distal thigh knee and the small saphenous vein in the popliteal  fossa. The vein diameters do not look too impressive though. She is going to see the vascular surgeon on Wednesday. She also had venous reflux in the right common femoral vein. She did not have any evidence of a DVT or SVT I am wondering whether there is an ablation procedure that would benefit her in the greater saphenous vein on the left. . She tells [place] that home health put the dressing on too tight and she took off 1 layer. The swelling in her left leg is a little worse as a result of this. Not much change in the wound measurements so the surface of the wound looks better Her arterial studies showed an ABI on the right of 1.14 with a triphasic waveform and a great toe pressure of 0.89. On the left her ABIs were noncompressible at 1.34 but with triphasic waveforms and a TBI of 0.97. Her greater toe pressure was 122. There was no evidence of significant bilateral arterial disease 6/20 patient went to see Dr. Durwin Nora. He did not think she had significant arterial disease. In terms of her venous duplex on the right side there was no evidence of a DVT or SVT there was deep venous reflux involving the common femoral vein no superficial vein reflux on the left side there was no DVT or SVT there was no deep vein graft reflux there was some reflux in the greater saphenous vein from the mid thigh to the knee but the vein here was not dilated. He thought these were venous wounds he prescribed a compression pump but I am not sure who we ordered it from The patient has been approved for Apligraf. Still using silver collagen this week 7/5; Apligraf #1 7/19 Apligraf #2. Decent improvement in the condition of the wound bed. Epithelialization distal 8/2 Apligraf #3. No issues or complaints. Denies signs of infection. 8/17 Apligraf #4. No issues or concerns. Some complaints of pruritus and the rash. Our intake nurse brought up the fact that she had previously indicated possible cotton layer sensitivity we will  therefore use kerlix in the bottom layer of the compression 8/30; the patient comes in with the area on her left medial leg just about healed. There is a superficial area more towards the tibia and a smaller open area distally everything else is epithelialized. I do not think she requires another Apligraf 9/13; left medial leg is healed. 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 Her edema control is good Readmisstion: 05-22-2022 upon evaluation today patient presents for initial inspection here in our clinic concerning issues that she has been having with a wound of the right lateral lower extremity. This is an area of a previous skin graft she tells me. With that being said she unfortunately had a scrape on this that occurred around 10 January. Since that time she has noted that this has just continued to get bigger and bigger in her niece who is present with her today actually states that 2 weeks ago when she saw that it was significantly smaller than what she sees currently. Obviously this is of utmost concern as we do not want  this to continue to get larger when arrested and get moving in the right direction. Fortunately there does not appear to be any signs of systemic infection though locally I think we probably do have some infection present. I would obtain a culture to see what we have going on here and then we will subsequently see where things go going forward. Fortunately I think that she is in the right place at this point being at the wound center we can definitely do something to try to get this moving in the right direction. Patient does have a history of chronic venous insufficiency as well as coronary artery disease. In the past she has done well with compression wraps on the start with a 3 layer wrap I think she is probably can end up going to a 4-layer at some point but we will see how things do over the next week. She has  been using wound cleanser along with she tells me a zinc and topical but again I am not sure exactly what that was. 05-29-2022 upon evaluation today patient appears to be doing well currently in regard to her wound. This actually is showing signs of improvement I am happy in that regard unfortunately her left leg has an area on the shin that has opened. This is the region where one of the areas at least we have previously taken care of. Nonetheless this is small hoping we get it under control before things worsen significantly. 06-05-2022 upon evaluation today patient appears to be doing well currently in regard to her wounds. The actually seem to be fairly clean she is having still quite a bit of problem with pain at this point she would like to not have debridement today for all possible. With that being said I do believe that we are making some good progress I think the [facility] is doing a decent job here. 3/6; patient has had 3/6; patient with known severe chronic venous insufficiency. She apparently had a traumatic wound at home and has a reasonably substantial wound on the right lateral lower leg and a smaller one on the left anterior lower leg as well. We have been using [address] and [employer] SASKIA, AVETISYAN ([id number]) [id number] Page 8 of 11 3/13; patient presents for follow-up. We have been using Hydrofera Blue under Coflex to the legs bilaterally. She has no issues or complaints today. 06-26-2022 upon evaluation today patient appears to be doing well currently in regard to her wound. This is measuring a little bit larger however. Fortunately I do not see any signs of infection this is on the right side. Fortunately the left side is actually completely healed which is great news. 07-03-2022 patient's wound unfortunately is continuing to show signs of being worse. Her compression which was the Tubigrip that I thought should be able to pull up  actually slipped down and she was not able to pull that back up. With that being said that means that unfortunately her wound has continued to deteriorate since I last saw her. This is definitely not the direction that we are looking for. I discussed with her that I do believe we need to go ahead and see about getting her started on antibiotics and I subsequently would also like to go ahead and see about getting things moving forward with regard to a wound culture and making a change up her dressings as well but this can be changed more frequently than just once a week. 07-10-2022 upon  evaluation today patient actually appears to be doing much better. I do believe that the antibiotics have been beneficial for her. Fortunately I do not see any signs of active infection locally nor systemically which is great news. No fevers, chills, nausea, vomiting, or diarrhea. 07-17-2022 upon evaluation today patient appears to be doing well currently in regard to her wound which is actually showing signs of improvement this is slow but nonetheless prevalent that we are seeing good improvement here. Fortunately I do not see any signs of active infection locally nor systemically which is great news. 07-24-2022 upon evaluation today patient appears to be doing well currently in regard to her wound although the wrap that we had on has been slipping down and she really does not have anybody to help her with this at home health is not going to be coming out we could not get anybody that would actually be able to do the dressing changes. Fortunately I do not see any signs of active infection locally nor systemically which is great news. 08-07-22 poorly in regard to her leg compared to what it was previous. Fortunately there does not appear to be any signs of active infection locally nor systemically which is great news. No fevers, chills, nausea, vomiting, or diarrhea. With that being said it does appear that the patient may have  some cellulitis in the leg which is not good. 08-14-2022 upon evaluation today patient appears to be doing somewhat better in regard to her wounds she still is having quite a bit of pain we are still not where we want to be as far as healing is concerned completely. Fortunately I do not see any evidence of active infection locally nor systemically which is great news and I am very pleased in that regard. 08-21-23 upon evaluation today patient appears to be doing poorly currently in regard to her wound. She has been tolerating the dressing changes without complication. Fortunately there does not appear to be any signs of active infection locally nor systemically at this time. 08-28-2022 upon evaluation today patient appears to be doing poorly in regard to her wound this was not wrapped appropriately home health did not go up high enough on the wrap. This has caused some issues and I discussed that with the patient today. I do believe that she is going to require aggressive wrapping and treatment which she is getting the [facility] topical antibiotics today they can start using that at the next wrap on Friday. In the meantime they need to make sure that the rapid and appropriately we wrote very specific orders today. 09-04-2022 upon evaluation today patient appears to be doing well currently in regard to her wound which I think is making progress is still hurting her quite significantly. Fortunately I do not see any signs of active infection locally or systemically which is great news. No fevers, chills, nausea, vomiting, or diarrhea. 09-11-2022 upon evaluation today patient's wound actually is showing signs of being a little bit smaller and looking a little bit better she still has a lot going on here however. Fortunately I do not see any evidence of active infection Worsening locally nor systemically which is great news. With that being said worsening still continue to use the topical Keystone  antibiotics. 09-18-2022 upon evaluation today patient actually showing some signs of improvement. I am actually very pleased with where we stand compared to where we have been. I think that she is making good headway here. She is still having quite  a bit of pain but it seems to be lessening compared to previous. 09-25-2022 upon evaluation patient's wound actually showing signs slowly but surely of improvement. Fortunately I do not see any signs of active infection at this time systemically and locally I feel like this is dramatically improved. She is doing well with the [facility] topical antibiotics. 10-02-2022 upon evaluation today patient appears to be doing better in regard to her wound overall. I am very pleased with where things stand from a visual standpoint I do not see any signs of active infection and in general I think that we are moving in the right direction here. 10-09-2022 upon evaluation today patient appears to be doing well currently in regard to her wound. She has been tolerating the dressing changes without complication. Fortunately I do not see any signs of active infection at this time which is great news. 10-16-2022 upon evaluation today patient continues to have quite a bit of pain in regard to her right leg. We have been attempting to debride little by little as we could but again was pretty limited by the fact that she is having significant discomfort. We do not actually have any signs of active infection going on at this point that the [facility] topical antibiotics have been helping quite readily. I been attempting to do as much debridement as I can but if she were to have pain medication this would actually help and in the past when she did it was much more tolerable for her as far as taking the edge off. Right now she has been without as she is in transition from her previous pain management physician to someone new to manage this. 10-23-2022 upon evaluation patient appears to be  doing excellent in regard to her wound. She has been tolerating the dressing changes without complication. Fortunately I do not see any evidence of active infection locally nor systemically which is great news. No fevers, chills, nausea, vomiting, or diarrhea. 7/24; patient with a wound secondary to chronic venous insufficiency on the right lateral lower leg. We have been using Keystone antibiotic Hydrofera Blue under kerlix Coban. She complains of a lot of pain after last week's debridement otherwise things appear to be improved per discussion with our intake nurse. 11-06-2022 upon evaluation today patient appears to be doing excellent at this point in regard to her wound. She has been tolerating the dressing changes without complication and to be honest she is making excellent progress towards complete closure. I am actually very pleased with where we stand I think that she is doing excellent as far as the overall appearance of the wound is concerned as well. She is going require some debridement however. 11-20-2022 upon evaluation today patient appears to be doing well currently in regard to her wound. She has been tolerating the dressing changes without complication. Fortunately there does not appear to be any signs of active infection locally or systemically which is great news and in general I do believe that we are moving in the right direction here. No fevers, chills, nausea, vomiting, or diarrhea. 11-27-2022 upon evaluation today patient appears to be doing a little better in regards to the overall appearance of her wound but unfortunately she is having increased pain. The collagen seems to be getting very dry and stuck to the wound bed which is causing her some issues here. Fortunately I do not see any signs of active infection locally nor systemically at this time. Fortunately I think that her wound is better  but unfortunately her pain has not. For that reason I am going to avoid any debridement  today since the patient is very upset about the pain and how bad this is hurting at this point. I think we can have to try something a little bit different I am thinking PolyMem may be a good option. 8/28; this patient has difficult venous wounds on the right lateral lower leg and ankle in the setting of chronic venous insufficiency. We have been using polymen under compression with kerlix Coban. 12-11-2022 upon evaluation today patient appears to be doing well currently in regard to her wound. She has been tolerating the dressing changes without complication and actually appears to be doing much better this week compared to when I saw her 2 weeks ago. The wound is measuring significantly smaller. We are using PolyMem along with Kerlix and Coban. YASENIA, RIZZUTO ([id number]) [id number] Page 9 of 11 12-18-2022 upon evaluation today patient appears to be doing well currently in regard to her wound. This is actually showing signs of improvement is measuring a little bit smaller and looking better. Fortunately I do not see any evidence of worsening overall and I believe that we will making good headway with the PolyMem and the [facility] topical antibiotics. 12-25-2022 upon evaluation today patient appears to be doing well currently in regard to her wound. She has been tolerating the dressing changes without complication. Fortunately I do not see any evidence of infection locally or systemically which is great news and in general I do believe that we will make an excellent headway towards complete closure also excellent news. 01-01-2023 upon evaluation patient's wound actually showing signs of improvement the wrap was actually put on properly this week and this is good news. Overall I am extremely happy with where things stand and how this appears today I do not see any signs of worsening overall and I believe that the patient is making excellent headway towards complete closure  which is great news. 01/08/2023 upon evaluation today patient appears to be doing well currently in regard to her leg. She is actually draining much less unfortunately home health is just not doing very well getting the supplies necessary in order to continue to take care of the patient. They are now telling me they cannot get the PolyMem which is something that has been doing really well for her. That really does not make sense to me you came to get PolyMem for a hospice patient. Nonetheless either way I think we may need to just discontinue treatment with home health and just manage the patient here at the clinic. 01-15-2023 upon evaluation today patient appears to be doing well currently in regard to her wound. She has been tolerating the dressing changes without complication. Fortunately I do not see any signs of infection I think she is doing quite well and very pleased with where we stand today. No fevers, chills, nausea, vomiting, or diarrhea. 01-22-2023 upon evaluation today patient appears to be doing well currently in regard to her wound. She has been tolerating the dressing changes without complication and both wounds are measuring smaller today and look to be doing excellent. I am actually very pleased with what ever standing here and I think that she is making really good headway towards closure which is great news. 01-29-2023 upon evaluation today patient actually tells me she has been having some increased pain. Subsequently it took a little bit of drilling down but in the end I realized that we  actually ended up putting a full Unna boot on her last week as opposed to just using a good anchor at the top and bottom. I think this is what wound up with her having increased discomfort because she tells me as she was doing well when she came in last time and by the time she left she tells me this was already getting significantly worse. And she hurt most of the week. With that being said I do  believe that the patient is doing quite well at this point with regard to her wounds and the wrap did okay it was not a problem other than the increased pain I think we may benefit from a Urgo K2 light compression wrap as this will be much more comfortable I think compared to what we have been doing with just the Kerlix Coban anyway. Objective Constitutional Well-nourished and well-hydrated in no acute distress. Vitals Time Taken: 10:56 AM, Height: 62 in, Weight: 171 lbs, BMI: 31.3, Temperature: 97.8 F, Pulse: 61 bpm, Respiratory Rate: 18 breaths/min, Blood Pressure: 119/66 mmHg. Respiratory normal breathing without difficulty. Psychiatric this patient is able to make decisions and demonstrates good insight into disease process. Alert and Oriented x 3. pleasant and cooperative. General Notes: Upon inspection patient's wound bed actually showed signs of good granulation epithelization at this point. Fortunately I do not see any signs of active infection locally or systemically which is great news and in general I do think there were making good headway towards closure. No fevers, chills, nausea, vomiting, or diarrhea. Integumentary (Hair, Skin) Wound #5 status is Open. Original cause of wound was Trauma. The date acquired was: 04/17/2022. The wound has been in treatment 36 weeks. The wound is located on the Right,Lateral Lower Leg. The wound measures 3.7cm length x 2.5cm width x 0.1cm depth; 7.265cm^2 area and 0.726cm^3 volume. There is Fat Layer (Subcutaneous Tissue) exposed. There is no tunneling or undermining noted. There is a medium amount of serosanguineous drainage noted. The wound margin is distinct with the outline attached to the wound base. There is medium (34-66%) red granulation within the wound bed. There is a medium (34-66%) amount of necrotic tissue within the wound bed including Adherent Slough. The periwound skin appearance exhibited: Scarring, Dry/Scaly, Hemosiderin  Staining. The periwound skin appearance did not exhibit: Callus, Crepitus, Excoriation, Induration, Rash, Maceration, Atrophie Blanche, Cyanosis, Ecchymosis, Mottled, Pallor, Rubor, Erythema. Periwound temperature was noted as No Abnormality. The periwound has tenderness on palpation. Wound #7 status is Open. Original cause of wound was Gradually Appeared. The date acquired was: 01/01/2023. The wound has been in treatment 4 weeks. The wound is located on the Right,Posterior Lower Leg. The wound measures 3.4cm length x 3.5cm width x 0.1cm depth; 9.346cm^2 area and 0.935cm^3 volume. There is Fat Layer (Subcutaneous Tissue) exposed. There is no tunneling or undermining noted. There is a medium amount of serosanguineous drainage noted. The wound margin is distinct with the outline attached to the wound base. There is small (1-33%) red, pink granulation within the wound bed. There is a large (67- 100%) amount of necrotic tissue within the wound bed including Adherent Slough. The periwound skin appearance exhibited: Scarring, Hemosiderin Staining. The periwound skin appearance did not exhibit: Callus, Crepitus, Excoriation, Induration, Rash, Dry/Scaly, Maceration, Atrophie Blanche, Cyanosis, Ecchymosis, Mottled, Pallor, Rubor, Erythema. Periwound temperature was noted as No Abnormality. The periwound has tenderness on palpation. 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 There was a Three Layer Compression Therapy Procedure by Karie Schwalbe, RN. Post  procedure Diagnosis Wound #5: Same as Pre-Procedure Wound #7 Pre-procedure diagnosis of Wound #7 is a Venous Leg Ulcer located on the Right,Posterior Lower Leg . There was a Three Layer Compression Therapy Procedure by Karie Schwalbe, RN. Post procedure Diagnosis Wound #7: Same as Pre-Procedure Plan Follow-up Appointments: Return Appointment in 1 week. Leonard Schwartz Wednesday 02/05/2023 10:30am (already scheduled) Return Appointment in 2 weeks. Leonard Schwartz Wednesday (already scheduled) Return appointment in 3 weeks. Leonard Schwartz Wednesday (already scheduled) Return appointment in 1 month. Leonard Schwartz Wednesday (already scheduled) Anesthetic: Wound #5 Right,Lateral Lower Leg: (In clinic) Topical Lidocaine 4% applied to wound bed Bathing/ Shower/ Hygiene: May shower and wash wound with soap and water. - with dressing changes. Edema Control - Lymphedema / SCD / Other: Elevate legs to the level of the heart or above for 30 minutes daily and/or when sitting for 3-4 times a day throughout the day. Avoid standing for long periods of time. Exercise regularly WOUND #5: - Lower Leg Wound Laterality: Right, Lateral Cleanser: Soap and Water 1 x Per Week/30 Days Discharge Instructions: May shower and wash wound with dial antibacterial soap and water prior to dressing change. Peri-Wound Care: Sween Lotion (Moisturizing lotion) 1 x Per Week/30 Days Discharge Instructions: Apply moisturizing lotion as directed Topical: compounding topical antibiotics 1 x Per Week/30 Days Discharge Instructions: apply Keystone directly to wound bed under the hydrofera blue. Home Health start. MIX ACCORDING TO THE PHARMACY INSTRUCTIONS. Prim Dressing: PolyMem Non-Adhesive Dressing, 4x4 in 1 x Per Week/30 Days ary Discharge Instructions: ******CUT SLITS INTO THE POLYMEM.***** Apply to wound bed as instructed Secondary Dressing: ABD Pad, 8x10 1 x Per Week/30 Days Discharge Instructions: Apply over primary dressing as directed. Secondary  Dressing: Woven Gauze Sponge, Non-Sterile 4x4 in 1 x Per Week/30 Days Discharge Instructions: Apply over primary dressing as directed. Secondary Dressing: Zetuvit Plus 4x8 in 1 x Per Week/30 Days Discharge Instructions: Or double ABD pad Apply over primary dressing as directed. Com pression Wrap: Urgo K2 Lite, (equivalent to a 3 layer) two layer compression system, regular 1 x Per Week/30 Days Discharge Instructions: Apply Urgo K2 Lite as directed (alternative to 3 layer compression). Com pression Wrap: unna boot FIRST LAYER **** SEE INSTRUCTIONS 1 x Per Week/30 Days Discharge Instructions: APPLY FIRST LAYER UNNA BOOT AT BASES OF TOES AND JUST BELOW THE KNEE TO HOLD COMPRESSION WRAP IN PLACE. WOUND #7: - Lower Leg Wound Laterality: Right, Posterior Cleanser: Soap and Water 1 x Per Week/30 Days Discharge Instructions: May shower and wash wound with dial antibacterial soap and water prior to dressing change. Peri-Wound Care: Sween Lotion (Moisturizing lotion) 1 x Per Week/30 Days Discharge Instructions: Apply moisturizing lotion as directed Topical: compounding topical antibiotics 1 x Per Week/30 Days Discharge Instructions: apply Keystone directly to wound bed under the hydrofera blue. Home Health start. MIX ACCORDING TO THE PHARMACY INSTRUCTIONS. Prim Dressing: PolyMem Non-Adhesive Dressing, 4x4 in 1 x Per Week/30 Days ary Discharge Instructions: ******CUT SLITS INTO THE POLYMEM.***** Apply to wound bed as instructed Secondary Dressing: ABD Pad, 8x10 1 x Per Week/30 Days Discharge Instructions: Apply over primary dressing as directed. Secondary Dressing: Woven Gauze  Sponge, Non-Sterile 4x4 in 1 x Per Week/30 Days Discharge Instructions: Apply over primary dressing as directed. Secondary Dressing: Zetuvit Plus 4x8 in 1 x Per Week/30 Days Discharge Instructions: Or double ABD pad Apply over primary dressing as directed. Com pression Wrap: Urgo K2 Lite, (equivalent to a 3 layer) two layer  compression system, regular 1 x Per Week/30 Days Discharge Instructions: Apply Urgo K2 Lite as directed (alternative to 3 layer compression). Com pression Wrap: unna boot FIRST LAYER **** SEE INSTRUCTIONS 1 x Per Week/30 Days Discharge Instructions: APPLY FIRST LAYER UNNA BOOT AT BASES OF TOES AND JUST BELOW THE KNEE TO HOLD COMPRESSION WRAP IN PLACE. 1. I would recommend based on what we are seeing that we have the patient continue to monitor for any signs of infection or worsening. Based on what I see I do think that she is making excellent headway towards closure and I am very pleased with where we stand I think that we can switch the Urgo K2 light compression wrap. 2. I am going to recommend that we use the units anchor just at the top and bottom obviously not the whole leg. 3. And also can recommend the patient should utilize the Zetuvit over top of the PolyMem which I think is doing awesome. 4. She will continue with the [facility] topical antibiotics. JOSELY, DINELLI ([id number]) [id number] Page 11 of 11 We will see patient back for reevaluation in 1 week here in the clinic. If anything worsens or changes patient will contact our office for additional recommendations. Electronic Signature(s) Signed: 01/29/2023 11:25:58 AM By: Allen Derry PA-C Entered By: Allen Derry on 01/29/2023 08:25:58 -------------------------------------------------------------------------------- SuperBill Details Patient Name: Date of Service: Jamie [name], Jamie [name]. 01/29/2023 Medical Record Number: [id number] Patient Account Number: [id number] Date of Birth/Sex: Treating RN: 1940/09/01 (82 y.o. F) Primary Care Provider: Belva Agee Other Clinician: Referring Provider: Treating Provider/Extender: [name], [name] in Treatment: 36 Diagnosis Coding ICD-10 Codes Code Description [phone] Chronic venous hypertension (idiopathic) with ulcer and  inflammation of right lower extremity 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d I25.10 Atherosclerotic heart disease of native coronary artery without angina pectoris Facility Procedures : CPT4 Code: [id number] Description: (Facility Use Only) [phone] - APPLY MULTLAY COMPRS LWR RT LEG ICD-10 Diagnosis Description 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d I87.331 Chronic venous hypertension (idiopathic) with ulcer  and inflammation of right lower ex I25.10 Atherosclerotic heart disease of native coronary artery without angina pectoris Modifier: tremity Quantity: 1 Physician Procedures : CPT4 Code Description Modifier [id number] - WC PHYS LEVEL 3 - EST PT ICD-10 Diagnosis Description I87.331 Chronic venous hypertension (idiopathic) with ulcer and inflammation of right lower extremity L97.812 Non-pressure chronic ulcer of other part  of right lower leg with fat layer exposed I25.10 Atherosclerotic heart disease of native coronary artery without angina pectoris Quantity: 1 Electronic Signature(s) Signed: 02/05/2023 2:32:10 PM By: Pearletha Alfred Signed: 02/05/2023 4:33:53 PM By: Allen Derry PA-C Previous Signature: 01/29/2023 11:28:45 AM Version By: Allen Derry PA-C Entered By: Pearletha Alfred on 02/05/2023 11:32:10

## 2023-01-30 NOTE — Progress Notes (Signed)
SAMICA, BEMBRY ([id number]) [id number] Page 1 of 8 Visit Report for 01/29/2023 Arrival Information Details Patient Name: Date of Service: Jamie [name], [name]. 01/29/2023 10:30 A M Medical Record Number: [id number] Patient Account Number: [id number] Date of Birth/Sex: Treating RN: 29-Apr-1940 (82 y.o. F) Primary Care Sebastian Dzik: Belva Agee Other Clinician: Referring Teng Decou: Treating Rowen Hur/Extender: [name], [name] in Treatment: 36 Visit Information History Since Last Visit Added or deleted any medications: No Patient Arrived: Wheel Chair Any new allergies or adverse reactions: No Arrival Time: 10:19 Had a fall or experienced change in No Accompanied By: niece activities of daily living that may affect Transfer Assistance: None risk of falls: Patient Identification Verified: Yes Signs or symptoms of abuse/neglect since last visito No Secondary Verification Process Completed: Yes Hospitalized since last visit: No Patient Requires Transmission-Based Precautions: No Implantable device outside of the clinic excluding No Patient Has Alerts: No cellular tissue based products placed in the center since last visit: Has Dressing in Place as Prescribed: Yes Has Compression in Place as Prescribed: Yes Pain Present Now: Yes Electronic Signature(s) Signed: 01/30/2023 4:59:00 PM By: Thayer Dallas Entered By: Thayer Dallas on 01/29/2023 10:56:07 -------------------------------------------------------------------------------- Compression Therapy Details Patient Name: Date of Service: Jamie Mart W. 01/29/2023 10:30 A M Medical Record Number: [id number] Patient Account Number: [id number] Date of Birth/Sex: Treating RN: [birth year] (82 y.o. Jamie [name] Primary Care Jesicca Dipierro: Belva Agee Other Clinician: Referring Nettie Wyffels: Treating Justinn Welter/Extender: [name], [name] in  Treatment: 36 Compression Therapy Performed for Wound Assessment: Wound #5 Right,Lateral Lower Leg Performed By: Clinician Karie Schwalbe, RN Compression Type: Three Layer Post Procedure Diagnosis Same as Pre-procedure Electronic Signature(s) Signed: 01/29/2023 2:51:19 PM By: Karie Schwalbe RN Entered By: Karie Schwalbe on 01/29/2023 11:14:06 [name] ([id number]) [id number] Page 2 of 8 -------------------------------------------------------------------------------- Compression Therapy Details Patient Name: Date of Service: Jamie [name], [name]. 01/29/2023 10:30 A M Medical Record Number: [id number] Patient Account Number: [id number] Date of Birth/Sex: Treating RN: 21-Sep-1940 (82 y.o. Jamie [name] Primary Care Marianny Goris: Belva Agee Other Clinician: Referring Arrow Tomko: Treating Aniyia Rane/Extender: [name], [name] in Treatment: 36 Compression Therapy Performed for Wound Assessment: Wound #7 Right,Posterior Lower Leg Performed By: Clinician Karie Schwalbe, RN Compression Type: Three Layer Post Procedure Diagnosis Same as Pre-procedure Electronic Signature(s) Signed: 01/29/2023 2:51:19 PM By: Karie Schwalbe RN Entered By: Karie Schwalbe on 01/29/2023 11:14:07 -------------------------------------------------------------------------------- Encounter Discharge Information Details Patient Name: Date of Service: Jamie [name], [name] 01/29/2023 10:30 A M Medical Record Number: [id number] Patient Account Number: [id number] Date of Birth/Sex: Treating RN: 12-07-1940 (82 y.o. Jamie [name] Primary Care Ronan Duecker: Belva Agee Other Clinician: Referring Sephora Boyar: Treating [place] Severs/Extender: [name], Billey Gosling in Treatment: 36 Encounter Discharge Information Items Discharge Condition: Stable Ambulatory Status: Wheelchair Discharge Destination: Home Transportation: Private  Auto Accompanied By: daughter Schedule Follow-up Appointment: No Clinical Summary of Care: Patient Declined Electronic Signature(s) Signed: 01/29/2023 2:51:19 PM By: Karie Schwalbe RN Entered By: Karie Schwalbe on 01/29/2023 13:09:13 -------------------------------------------------------------------------------- Lower Extremity Assessment Details Patient Name: Date of Service: Jamie [name], [name]. 01/29/2023 10:30 A M Medical Record Number: [id number] Patient Account Number: [id number] Date of Birth/Sex: Treating RN: 1940-09-19 (82 y.o. F) Primary Care Terrill Alperin: Belva Agee Other Clinician: Referring Vimal Derego: Treating Berit Raczkowski/Extender: [name], [name] in Treatment: 36 Edema Assessment Assessed: [Left: No] [Right: No] Edema: [Left: Ye] [Right: s] [address] Jamie [name], Jamie [name] ([id number]) [phone].pdf Page 3 of 8 Left: Right: Point  of Measurement: From Medial Instep 37.5 cm Ankle Left: Right: Point of Measurement: From Medial Instep 24.5 cm Vascular Assessment Extremity colors, hair growth, and conditions: Extremity Color: [Right:Normal] Hair Growth on Extremity: [Right:No] Temperature of Extremity: [Right:Warm] Capillary Refill: [Right:< 3 seconds] Dependent Rubor: [Right:No No] Electronic Signature(s) Signed: 01/30/2023 4:59:00 PM By: Thayer Dallas Entered By: Thayer Dallas on 01/29/2023 10:57:44 -------------------------------------------------------------------------------- Multi-Disciplinary Care Plan Details Patient Name: Date of Service: Jamie [name], [name] 01/29/2023 10:30 A M Medical Record Number: [id number] Patient Account Number: [id number] Date of Birth/Sex: Treating RN: [birth year] (82 y.o. Jamie [name] Primary Care Brylon Brenning: Belva Agee Other Clinician: Referring Hassie Mandt: Treating Reese Stockman/Extender: [name] in Treatment: 36 Multidisciplinary Care  Plan reviewed with physician Active Inactive Pain, Acute or Chronic Nursing Diagnoses: Pain Management - Cyclic Acute (Dressing Change Related) Pain Management - Non-cyclic Acute (Procedural) Pain, acute or chronic: actual or potential Goals: Patient will verbalize adequate pain control and receive pain control interventions during procedures as needed Date Initiated: 09/11/2022 Target Resolution Date: 04/08/2023 Goal Status: Active Patient/caregiver will verbalize adequate pain control between visits Date Initiated: 09/11/2022 Target Resolution Date: 03/07/2023 Goal Status: Active Patient/caregiver will verbalize comfort level met Date Initiated: 09/11/2022 Target Resolution Date: 04/08/2023 Goal Status: Active Interventions: Complete pain assessment as per visit requirements Encourage patient to take pain medications as prescribed Provide education on pain management Provision of support: recognize patient pain, provide comfort and support as needed Reposition patient for comfort Treatment Activities: Administer pain control measures as ordered : 09/11/2022 Notes: Jamie [name], Jamie [name] ([id number]) [phone].pdf Page 4 of 8 Electronic Signature(s) Signed: 01/29/2023 2:51:19 PM By: Karie Schwalbe RN Entered By: Karie Schwalbe on 01/29/2023 13:05:18 -------------------------------------------------------------------------------- Pain Assessment Details Patient Name: Date of Service: Jamie [name]. 01/29/2023 10:30 A M Medical Record Number: [id number] Patient Account Number: [id number] Date of Birth/Sex: Treating RN: 07-Jun-1940 (82 y.o. F) Primary Care Ardie Dragoo: Belva Agee Other Clinician: Referring Alycia Cooperwood: Treating Nicholle Falzon/Extender: [name], [name] in Treatment: 36 Active Problems Location of Pain Severity and Description of Pain Patient Has Paino Yes Site Locations Rate the pain. Current Pain Level: 9 Pain  Management and Medication Current Pain Management: Electronic Signature(s) Signed: 01/30/2023 4:59:00 PM By: Thayer Dallas Entered By: Thayer Dallas on 01/29/2023 10:57:26 -------------------------------------------------------------------------------- Patient/Caregiver Education Details Patient Name: Date of Service: Jamie [name] 10/23/2024andnbsp10:30 A M Medical Record Number: [id number] Patient Account Number: [id number] Date of Birth/Gender: Treating RN: 03/01/1941 (82 y.o. Jamie [name] Primary Care Physician: Belva Agee Other Clinician: Referring Physician: Treating Physician/Extender: [name], Billey Gosling in Treatment: 36 Education Assessment Education Provided To: Patient Jamie [name], Jamie [name] ([id number]) [id number] Page 5 of 8 Education Topics Provided Wound/Skin Impairment: Methods: Explain/Verbal Responses: State content correctly Electronic Signature(s) Signed: 01/29/2023 2:51:19 PM By: Karie Schwalbe RN Entered By: Karie Schwalbe on 01/29/2023 13:07:11 -------------------------------------------------------------------------------- Wound Assessment Details Patient Name: Date of Service: Jamie [name]. 01/29/2023 10:30 A M Medical Record Number: [id number] Patient Account Number: [id number] Date of Birth/Sex: Treating RN: Jan 18, 1941 (82 y.o. F) Primary Care Omauri Boeve: Belva Agee Other Clinician: Referring Deyton Ellenbecker: Treating Basil Buffin/Extender: [name], [name] in Treatment: 36 Wound Status Wound Number: 5 Primary Venous Leg Ulcer Etiology: Wound Location: Right, Lateral Lower Leg Wound Open Wounding Event: Trauma Status: Date Acquired: 04/17/2022 Comorbid Sleep Apnea, Hypertension, Peripheral Venous Disease, Weeks Of Treatment: 36 History: Osteoarthritis, Neuropathy Clustered Wound: Yes Photos Wound Measurements Length: (cm) Width:  (cm) Depth: (cm) Clustered  Quantity: Area: (cm) Volume: (cm) 3.7 % Reduction in Area: 72.2% 2.5 % Reduction in Volume: 90.8% 0.1 Epithelialization: Medium (34-66%) 1 Tunneling: No 7.265 Undermining: No 0.726 Wound Description Classification: Full Thickness Without Exposed Supp Wound Margin: Distinct, outline attached Exudate Amount: Medium Exudate Type: Serosanguineous Exudate Color: red, brown ort Structures Foul Odor After Cleansing: No Slough/Fibrino Yes Wound Bed Granulation Amount: Medium (34-66%) Exposed Structure Granulation Quality: Red Fascia Exposed: No Necrotic Amount: Medium (34-66%) Fat Layer (Subcutaneous Tissue) Exposed: Yes Necrotic Quality: Adherent Slough Tendon Exposed: No Muscle Exposed: No Joint Exposed: No Bone Exposed: No Jamie [name], Jamie [name] ([id number]) [id number] Page 6 of 8 Periwound Skin Texture Texture Color No Abnormalities Noted: No No Abnormalities Noted: No Callus: No Atrophie Blanche: No Crepitus: No Cyanosis: No Excoriation: No Ecchymosis: No Induration: No Erythema: No Rash: No Hemosiderin Staining: Yes Scarring: Yes Mottled: No Pallor: No Moisture Rubor: No No Abnormalities Noted: No Dry / Scaly: Yes Temperature / Pain Maceration: No Temperature: No Abnormality Tenderness on Palpation: Yes Treatment Notes Wound #5 (Lower Leg) Wound Laterality: Right, Lateral Cleanser Soap and Water Discharge Instruction: May shower and wash wound with dial antibacterial soap and water prior to dressing change. Peri-Wound Care Sween Lotion (Moisturizing lotion) Discharge Instruction: Apply moisturizing lotion as directed Topical compounding topical antibiotics Discharge Instruction: apply Keystone directly to wound bed under the hydrofera blue. Home Health start. MIX ACCORDING TO THE PHARMACY INSTRUCTIONS. Primary Dressing PolyMem Non-Adhesive Dressing, 4x4 in Discharge Instruction: ******CUT SLITS INTO  THE POLYMEM.***** Apply to wound bed as instructed Secondary Dressing ABD Pad, 8x10 Discharge Instruction: Apply over primary dressing as directed. Woven Gauze Sponge, Non-Sterile 4x4 in Discharge Instruction: Apply over primary dressing as directed. Zetuvit Plus 4x8 in Discharge Instruction: Or double ABD pad Apply over primary dressing as directed. Secured With Compression Wrap Urgo K2 Lite, (equivalent to a 3 layer) two layer compression system, regular Discharge Instruction: Apply Urgo K2 Lite as directed (alternative to 3 layer compression). unna boot FIRST LAYER **** SEE INSTRUCTIONS Discharge Instruction: APPLY FIRST LAYER UNNA BOOT AT BASES OF TOES AND JUST BELOW THE KNEE TO HOLD COMPRESSION WRAP IN PLACE. Compression Stockings Add-Ons Electronic Signature(s) Signed: 01/29/2023 2:51:19 PM By: Karie Schwalbe RN Entered By: Karie Schwalbe on 01/29/2023 11:02:21 -------------------------------------------------------------------------------- Wound Assessment Details Patient Name: Date of Service: Jamie [name]. 01/29/2023 10:30 A M Medical Record Number: [id number] Patient Account Number: [id number] Date of Birth/Sex: Treating RN: 26-Oct-1940 (82 y.o. Jamie [name], Jamie [name] ([id number]) [id number] Page 7 of 8 Primary Care Waylon Hershey: Belva Agee Other Clinician: Referring Judyth Demarais: Treating Joelie Schou/Extender: [name], [name] in Treatment: 36 Wound Status Wound Number: 7 Primary Venous Leg Ulcer Etiology: Wound Location: Right, Posterior Lower Leg Wound Open Wounding Event: Gradually Appeared Status: Date Acquired: 01/01/2023 Comorbid Sleep Apnea, Hypertension, Peripheral Venous Disease, Weeks Of Treatment: 4 History: Osteoarthritis, Neuropathy Clustered Wound: No Photos Wound Measurements Length: (cm) 3.4 Width: (cm) 3.5 Depth: (cm) 0.1 Area: (cm) 9.346 Volume: (cm) 0.935 % Reduction in Area:  20.5% % Reduction in Volume: 60.2% Epithelialization: Small (1-33%) Tunneling: No Undermining: No Wound Description Classification: Full Thickness Without Exposed Support Structures Wound Margin: Distinct, outline attached Exudate Amount: Medium Exudate Type: Serosanguineous Exudate Color: red, brown Foul Odor After Cleansing: No Slough/Fibrino Yes Wound Bed Granulation Amount: Small (1-33%) Exposed Structure Granulation Quality: Red, Pink Fascia Exposed: No Necrotic Amount: Large (67-100%) Fat Layer (Subcutaneous Tissue) Exposed: Yes Necrotic Quality: Adherent Slough Tendon Exposed: No Muscle Exposed: No Joint Exposed: No Bone Exposed: No  Periwound Skin Texture Texture Color No Abnormalities Noted: No No Abnormalities Noted: No Callus: No Atrophie Blanche: No Crepitus: No Cyanosis: No Excoriation: No Ecchymosis: No Induration: No Erythema: No Rash: No Hemosiderin Staining: Yes Scarring: Yes Mottled: No Pallor: No Moisture Rubor: No No Abnormalities Noted: No Dry / Scaly: No Temperature / Pain Maceration: No Temperature: No Abnormality Tenderness on Palpation: Yes Treatment Notes Wound #7 (Lower Leg) Wound Laterality: Right, Posterior Cleanser Soap and Water Discharge Instruction: May shower and wash wound with dial antibacterial soap and water prior to dressing change. Peri-Wound Care Jamie [name], Jamie [name] ([id number]) [id number] Page 8 of 8 Sween Lotion (Moisturizing lotion) Discharge Instruction: Apply moisturizing lotion as directed Topical compounding topical antibiotics Discharge Instruction: apply Jodie Echevaria directly to wound bed under the hydrofera blue. Home Health start. MIX ACCORDING TO THE PHARMACY INSTRUCTIONS. Primary Dressing PolyMem Non-Adhesive Dressing, 4x4 in Discharge Instruction: ******CUT SLITS INTO THE POLYMEM.***** Apply to wound bed as instructed Secondary Dressing ABD Pad, 8x10 Discharge Instruction:  Apply over primary dressing as directed. Woven Gauze Sponge, Non-Sterile 4x4 in Discharge Instruction: Apply over primary dressing as directed. Zetuvit Plus 4x8 in Discharge Instruction: Or double ABD pad Apply over primary dressing as directed. Secured With Compression Wrap Urgo K2 Lite, (equivalent to a 3 layer) two layer compression system, regular Discharge Instruction: Apply Urgo K2 Lite as directed (alternative to 3 layer compression). unna boot FIRST LAYER **** SEE INSTRUCTIONS Discharge Instruction: APPLY FIRST LAYER UNNA BOOT AT BASES OF TOES AND JUST BELOW THE KNEE TO HOLD COMPRESSION WRAP IN PLACE. Compression Stockings Add-Ons Electronic Signature(s) Signed: 01/29/2023 2:51:19 PM By: Karie Schwalbe RN Entered By: Karie Schwalbe on 01/29/2023 11:03:34 -------------------------------------------------------------------------------- Vitals Details Patient Name: Date of Service: Jamie Brink, [name] 01/29/2023 10:30 A M Medical Record Number: [id number] Patient Account Number: [id number] Date of Birth/Sex: Treating RN: 1940-07-04 (82 y.o. F) Primary Care Maila Dukes: Belva Agee Other Clinician: Referring Jameia Makris: Treating Cordon Gassett/Extender: [name], [name] in Treatment: 36 Vital Signs Time Taken: 10:56 Temperature (F): 97.8 Height (in): 62 Pulse (bpm): 61 Weight (lbs): 171 Respiratory Rate (breaths/min): 18 Body Mass Index (BMI): 31.3 Blood Pressure (mmHg): 119/66 Reference Range: 80 - 120 mg / dl Electronic Signature(s) Signed: 01/30/2023 4:59:00 PM By: Thayer Dallas Entered By: Thayer Dallas on 01/29/2023 10:56:25

## 2023-02-03 NOTE — Progress Notes (Signed)
Jamie [name], Jamie [name] ([id number]) [id number] Page 1 of 8 Visit Report for 01/22/2023 Arrival Information Details Patient Name: Date of Service: Jamie [name], [name]. 01/22/2023 10:30 A M Medical Record Number: [id number] Patient Account Number: [id number] Date of Birth/Sex: Treating RN: 01-Mar-1941 (82 y.o. F) Primary Care Jamie [name]: Jamie [name] Other Clinician: Referring Jamie [name]: Treating Jamie [name]/Extender: Jamie [name], Jamie [name] in Treatment: 35 Visit Information History Since Last Visit Added or deleted any medications: No Patient Arrived: Wheel Chair Any new allergies or adverse reactions: No Arrival Time: 11:17 Had a fall or experienced change in No Accompanied By: niece activities of daily living that may affect Transfer Assistance: None risk of falls: Patient Identification Verified: Yes Signs or symptoms of abuse/neglect since last visito No Secondary Verification Process Completed: Yes Hospitalized since last visit: No Patient Requires Transmission-Based Precautions: No Implantable device outside of the clinic excluding No Patient Has Alerts: No cellular tissue based products placed in the center since last visit: Has Dressing in Place as Prescribed: Yes Has Compression in Place as Prescribed: Yes Pain Present Now: Yes Electronic Signature(s) Signed: 01/22/2023 4:39:33 PM By: Jamie [name] Entered By: Jamie [name] on 01/22/2023 08:18:14 -------------------------------------------------------------------------------- Clinic Level of Care Assessment Details Patient Name: Date of Service: Jamie [name], [name]. 01/22/2023 10:30 A M Medical Record Number: [id number] Patient Account Number: [id number] Date of Birth/Sex: Treating RN: 1940/10/26 (82 y.o. Jamie [name] Primary Care Osric Klopf: Jamie [name] Other Clinician: Referring Jamie [name]: Treating Jamie [name]/Extender: Jamie [name],  Jamie [name] in Treatment: 35 Clinic Level of Care Assessment Items TOOL 4 Quantity Score X- 1 0 Use when only an EandM is performed on FOLLOW-UP visit ASSESSMENTS - Nursing Assessment / Reassessment X- 1 10 Reassessment of Co-morbidities (includes updates in patient status) X- 1 5 Reassessment of Adherence to Treatment Plan ASSESSMENTS - Wound and Skin A ssessment / Reassessment X - Simple Wound Assessment / Reassessment - one wound 1 5 []  - 0 Complex Wound Assessment / Reassessment - multiple wounds []  - 0 Dermatologic / Skin Assessment (not related to wound area) ASSESSMENTS - Focused Assessment X- 1 5 Circumferential Edema Measurements - multi extremities []  - 0 Nutritional Assessment / Counseling / Intervention Jamie [name] ([id number]) [id number] Page 2 of 8 []  - 0 Lower Extremity Assessment (monofilament, tuning fork, pulses) []  - 0 Peripheral Arterial Disease Assessment (using hand held doppler) ASSESSMENTS - Ostomy and/or Continence Assessment and Care []  - 0 Incontinence Assessment and Management []  - 0 Ostomy Care Assessment and Management (repouching, etc.) PROCESS - Coordination of Care X - Simple Patient / Family Education for ongoing care 1 15 []  - 0 Complex (extensive) Patient / Family Education for ongoing care X- 1 10 Staff obtains Chiropractor, Records, T Results / Process Orders est []  - 0 Staff telephones HHA, Nursing Homes / Clarify orders / etc []  - 0 Routine Transfer to another Facility (non-emergent condition) []  - 0 Routine Hospital Admission (non-emergent condition) []  - 0 New Admissions / Manufacturing engineer / Ordering NPWT Apligraf, etc. , []  - 0 Emergency Hospital Admission (emergent condition) []  - 0 Simple Discharge Coordination []  - 0 Complex (extensive) Discharge Coordination PROCESS - Special Needs []  - 0 Pediatric / Minor Patient Management []  - 0 Isolation Patient Management []  -  0 Hearing / Language / Visual special needs []  - 0 Assessment of Community assistance (transportation, D/C planning, etc.) []  - 0 Additional assistance / Altered mentation []  - 0 Support Surface(s) Assessment (bed, cushion, seat, etc.) INTERVENTIONS -  Wound Cleansing / Measurement []  - 0 Simple Wound Cleansing - one wound X- 2 5 Complex Wound Cleansing - multiple wounds X- 1 5 Wound Imaging (photographs - any number of wounds) []  - 0 Wound Tracing (instead of photographs) X- 1 5 Simple Wound Measurement - one wound []  - 0 Complex Wound Measurement - multiple wounds INTERVENTIONS - Wound Dressings []  - 0 Small Wound Dressing one or multiple wounds []  - 0 Medium Wound Dressing one or multiple wounds X- 1 20 Large Wound Dressing one or multiple wounds X- 1 5 Application of Medications - topical []  - 0 Application of Medications - injection INTERVENTIONS - Miscellaneous []  - 0 External ear exam []  - 0 Specimen Collection (cultures, biopsies, blood, body fluids, etc.) []  - 0 Specimen(s) / Culture(s) sent or taken to Lab for analysis []  - 0 Patient Transfer (multiple staff / Nurse, adult / Similar devices) []  - 0 Simple Staple / Suture removal (25 or less) []  - 0 Complex Staple / Suture removal (26 or more) []  - 0 Hypo / Hyperglycemic Management (close monitor of Blood Glucose) Jamie [name], Jamie [name] ([id number]) [id number] Page 3 of 8 []  - 0 Ankle / Brachial Index (ABI) - do not check if billed separately X- 1 5 Vital Signs Has the patient been seen at the hospital within the last three years: Yes Total Score: 100 Level Of Care: New/Established - Level 3 Electronic Signature(s) Signed: 02/03/2023 4:05:37 PM By: Jamie Pulling RN, BSN Entered By: Jamie [name] on 01/22/2023 08:59:05 -------------------------------------------------------------------------------- Encounter Discharge Information Details Patient Name: Date of Service: Jamie [name], Jamie [name]. 01/22/2023 10:30 A M Medical Record Number: [id number] Patient Account Number: [id number] Date of Birth/Sex: Treating RN: 1941-01-17 (82 y.o. Jamie [name] Primary Care Usiel Astarita: Jamie [name] Other Clinician: Referring Abbagail Scaff: Treating Shamarcus Hoheisel/Extender: Jamie [name], Billey Gosling in Treatment: 35 Encounter Discharge Information Items Discharge Condition: Stable Ambulatory Status: Wheelchair Discharge Destination: Home Transportation: Private Auto Accompanied By: daughter Schedule Follow-up Appointment: Yes Clinical Summary of Care: Patient Declined Electronic Signature(s) Signed: 02/03/2023 4:05:37 PM By: Jamie Pulling RN, BSN Entered By: Jamie [name] on 01/22/2023 09:00:18 -------------------------------------------------------------------------------- Lower Extremity Assessment Details Patient Name: Date of Service: Jamie Mart [name]. 01/22/2023 10:30 A M Medical Record Number: [id number] Patient Account Number: [id number] Date of Birth/Sex: Treating RN: [birth year] (82 y.o. F) Primary Care Salathiel Ferrara: Jamie [name] Other Clinician: Referring Belvia Gotschall: Treating Haeleigh Streiff/Extender: Jamie [name], Jamie [name] in Treatment: 35 Edema Assessment Assessed: [Left: No] [Right: No] Edema: [Left: Ye] [Right: s] Calf Left: Right: Point of Measurement: From Medial Instep 34.5 cm Ankle Left: Right: Point of Measurement: From Medial Instep 24.2 cm Vascular Assessment Left: [[id number] Page 4 of 8Right:] Extremity colors, hair growth, and conditions: Extremity Color: [[id number] Page 4 of 8Normal] Hair Growth on Extremity: [[id number] Page 4 of 8No] Temperature of Extremity: [[id number] Page 4 of 8Warm] Capillary Refill: [id number] Page 4 of 8< 3 seconds] Dependent Rubor:  [[id number] Page 4 of 8No No] Electronic Signature(s) Signed: 01/22/2023 4:39:33 PM By: Jamie [name] Entered By: Jamie [name] on 01/22/2023 08:23:56 -------------------------------------------------------------------------------- Multi-Disciplinary Care Plan Details Patient Name: Date of Service: Jamie [name], Jamie [name]. 01/22/2023 10:30 A M Medical Record Number: [id number] Patient Account Number: [id number] Date of Birth/Sex: Treating RN: 07/24/1940 (82 y.o. Jamie [name] Primary Care Kortni Hasten: Jamie [name] Other Clinician: Referring Earnest Thalman: Treating Britain Anagnos/Extender: [name] in Treatment: 35 Multidisciplinary Care Plan reviewed with physician Active Inactive Pain, Acute or Chronic Nursing Diagnoses:  Pain Management - Cyclic Acute (Dressing Change Related) Pain Management - Non-cyclic Acute (Procedural) Pain, acute or chronic: actual or potential Goals: Patient will verbalize adequate pain control and receive pain control interventions during procedures as needed Date Initiated: 09/11/2022 Target Resolution Date: 02/06/2023 Goal Status: Active Patient/caregiver will verbalize adequate pain control between visits Date Initiated: 09/11/2022 Target Resolution Date: 02/06/2023 Goal Status: Active Patient/caregiver will verbalize comfort level met Date Initiated: 09/11/2022 Target Resolution Date: 02/06/2023 Goal Status: Active Interventions: Complete pain assessment as per visit requirements Encourage patient to take pain medications as prescribed Provide education on pain management Provision of support: recognize patient pain, provide comfort and support as needed Reposition patient for comfort Treatment Activities: Administer pain control measures as ordered : 09/11/2022 Notes: Electronic Signature(s) Signed: 02/03/2023 4:05:37 PM By: Jamie Pulling RN, BSN Entered By: Jamie [name] on 01/22/2023  08:40:56 [name] ([id number]) [id number] Page 5 of 8 -------------------------------------------------------------------------------- Pain Assessment Details Patient Name: Date of Service: Jamie [name], [name]. 01/22/2023 10:30 A M Medical Record Number: [id number] Patient Account Number: [id number] Date of Birth/Sex: Treating RN: [birth year] (82 y.o. F) Primary Care Annaleese Guier: Jamie [name] Other Clinician: Referring Sharline Lehane: Treating Carmelina Balducci/Extender: Jamie [name], Jamie [name] in Treatment: 35 Active Problems Location of Pain Severity and Description of Pain Patient Has Paino Yes Site Locations Pain Location: Pain in Ulcers Rate the pain. Current Pain Level: 7 Pain Management and Medication Current Pain Management: Electronic Signature(s) Signed: 01/22/2023 4:39:33 PM By: Jamie [name] Entered By: Jamie [name] on 01/22/2023 08:34:02 -------------------------------------------------------------------------------- Patient/Caregiver Education Details Patient Name: Date of Service: Jamie [name] 10/16/2024andnbsp10:30 A M Medical Record Number: [id number] Patient Account Number: [id number] Date of Birth/Gender: Treating RN: Jan 06, 1941 (82 y.o. Jamie [name] Primary Care Physician: Jamie [name] Other Clinician: Referring Physician: Treating Physician/Extender: Jamie [name], Billey Gosling in Treatment: 30 Education Assessment Education Provided To: Patient Education Topics Provided Wound/Skin Impairment: Methods: Explain/Verbal Responses: State content correctly Jamie [name], Jamie [name] ([id number]) [id number] Page 6 of 8 Electronic Signature(s) Signed: 02/03/2023 4:05:37 PM By: Jamie Pulling RN, BSN Entered By: Jamie [name] on 01/22/2023 08:41:16 -------------------------------------------------------------------------------- Wound Assessment  Details Patient Name: Date of Service: Jamie Mart [name]. 01/22/2023 10:30 A M Medical Record Number: [id number] Patient Account Number: [id number] Date of Birth/Sex: Treating RN: 06-30-1940 (82 y.o. F) Primary Care Daphnee Preiss: Jamie [name] Other Clinician: Referring Jaymari Cromie: Treating Kampbell Holaway/Extender: Jamie [name], Jamie [name] in Treatment: 35 Wound Status Wound Number: [id number] Primary Venous Leg Ulcer Etiology: Wound Location: Right, Lateral Lower Leg Wound Open Wounding Event: Trauma Status: Date Acquired: 04/17/2022 Comorbid Sleep Apnea, Hypertension, Peripheral Venous Disease, [name] Of Treatment: 35 History: Osteoarthritis, Neuropathy Clustered Wound: Yes Photos Wound Measurements Length: (cm) Width: (cm) Depth: (cm) Clustered Quantity: Area: (cm) Volume: (cm) 5 % Reduction in Area: 55% 3 % Reduction in Volume: 85% 0.1 Epithelialization: Medium (34-66%) 1 Tunneling: No 11.781 Undermining: No 1.178 Wound Description Classification: Full Thickness Without Exposed Supp Wound Margin: Distinct, outline attached Exudate Amount: Medium Exudate Type: Serosanguineous Exudate Color: red, brown ort Structures Foul Odor After Cleansing: No Slough/Fibrino Yes Wound Bed Granulation Amount: Medium (34-66%) Exposed Structure Granulation Quality: Red Fascia Exposed: No Necrotic Amount: Medium (34-66%) Fat Layer (Subcutaneous Tissue) Exposed: Yes Necrotic Quality: Adherent Slough Tendon Exposed: No Muscle Exposed: No Joint Exposed: No Bone Exposed: No Periwound Skin Texture Texture Color No Abnormalities Noted: No No Abnormalities Noted: No Callus: No Atrophie Blanche: No Crepitus: No Cyanosis: No Jamie [name], Jamie [name] ([id number]) [id number] Page 7  of 8 Excoriation: No Ecchymosis: No Induration: No Erythema: No Rash: No Hemosiderin Staining: Yes Scarring: Yes Mottled: No Pallor: No Moisture Rubor: No No  Abnormalities Noted: No Dry / Scaly: Yes Temperature / Pain Maceration: No Temperature: No Abnormality Tenderness on Palpation: Yes Electronic Signature(s) Signed: 01/22/2023 4:39:33 PM By: Jamie [name] Entered By: Jamie [name] on 01/22/2023 08:26:17 -------------------------------------------------------------------------------- Wound Assessment Details Patient Name: Date of Service: Jamie Mart [name]. 01/22/2023 10:30 A M Medical Record Number: [id number] Patient Account Number: [id number] Date of Birth/Sex: Treating RN: 1940-06-20 (82 y.o. F) Primary Care Rihanna Marseille: Jamie [name] Other Clinician: Referring Ellisa Devivo: Treating Dameshia Seybold/Extender: Jamie [name], Jamie [name] in Treatment: 35 Wound Status Wound Number: 7 Primary Venous Leg Ulcer Etiology: Wound Location: Right, Posterior Lower Leg Wound Open Wounding Event: Gradually Appeared Status: Date Acquired: 01/01/2023 Comorbid Sleep Apnea, Hypertension, Peripheral Venous Disease, [name] Of Treatment: 3 History: Osteoarthritis, Neuropathy Clustered Wound: No Photos Wound Measurements Length: (cm) 3 Width: (cm) 3.5 Depth: (cm) 0.2 Area: (cm) 8.247 Volume: (cm) 1.649 % Reduction in Area: 29.8% % Reduction in Volume: 29.8% Epithelialization: Small (1-33%) Tunneling: No Undermining: No Wound Description Classification: Full Thickness Without Exposed Support Wound Margin: Distinct, outline attached Exudate Amount: Medium Exudate Type: Serosanguineous Exudate Color: red, brown Structures Foul Odor After Cleansing: No Slough/Fibrino Yes Wound Bed Granulation Amount: Small (1-33%) Exposed Structure Granulation Quality: Red, Pink Fascia Exposed: No Necrotic Amount: Large (67-100%) Fat Layer (Subcutaneous Tissue) Exposed: Yes Necrotic Quality: Adherent Slough Tendon Exposed: No Muscle Exposed: No Jamie [name], Jamie [name] ([id number]) [id number] Page 8 of 8 Joint  Exposed: No Bone Exposed: No Periwound Skin Texture Texture Color No Abnormalities Noted: No No Abnormalities Noted: No Callus: No Atrophie Blanche: No Crepitus: No Cyanosis: No Excoriation: No Ecchymosis: No Induration: No Erythema: No Rash: No Hemosiderin Staining: Yes Scarring: Yes Mottled: No Pallor: No Moisture Rubor: No No Abnormalities Noted: No Dry / Scaly: No Temperature / Pain Maceration: No Temperature: No Abnormality Tenderness on Palpation: Yes Electronic Signature(s) Signed: 01/22/2023 4:39:33 PM By: Jamie [name] Entered By: Jamie [name] on 01/22/2023 08:26:49 -------------------------------------------------------------------------------- Vitals Details Patient Name: Date of Service: Jamie Brink, MA RGUERITE [name]. 01/22/2023 10:30 A M Medical Record Number: [id number] Patient Account Number: [id number] Date of Birth/Sex: Treating RN: [birth year] (82 y.o. F) Primary Care Sophi Calligan: Jamie [name] Other Clinician: Referring Drina Jobst: Treating Angeligue Bowne/Extender: Jamie [name], Jamie [name] in Treatment: 35 Vital Signs Time Taken: 11:18 Temperature (F): 98 Height (in): 62 Pulse (bpm): 62 Weight (lbs): 171 Respiratory Rate (breaths/min): 18 Body Mass Index (BMI): 31.3 Blood Pressure (mmHg): 113/67 Reference Range: 80 - 120 mg / dl Electronic Signature(s) Signed: 01/22/2023 4:39:33 PM By: Jamie [name] Entered By: Jamie [name] on 01/22/2023 08:21:00

## 2023-02-05 ENCOUNTER — Encounter (HOSPITAL_BASED_OUTPATIENT_CLINIC_OR_DEPARTMENT_OTHER): Payer: Medicare HMO | Admitting: Physician Assistant

## 2023-02-05 DIAGNOSIS — L97812 Non-pressure chronic ulcer of other part of right lower leg with fat layer exposed: Secondary | ICD-10-CM | POA: Diagnosis not present

## 2023-02-05 NOTE — Progress Notes (Addendum)
BRITINI, GARCILAZO ([id number]) [id number] Page 1 of 13 Visit Report for 02/05/2023 Chief Complaint Document Details Patient Name: Date of Service: Jamie [name], [name]. 02/05/2023 10:30 A M Medical Record Number: [id number] Patient Account Number: [id number] Date of Birth/Sex: Treating RN: [birth year] (82 y.o. F) Primary Care Provider: Belva [name] Other Clinician: Referring Provider: Treating Provider/Extender: Jamie [name], Jamie [name] in Treatment: 37 Information Obtained from: Patient Chief Complaint Right LE Ulcer Electronic Signature(s) Signed: 02/05/2023 9:47:46 AM By: Jamie Derry PA-C Entered By: Jamie [name] on 02/05/2023 09:47:46 -------------------------------------------------------------------------------- Debridement Details Patient Name: Date of Service: Jamie [name], Jamie [name] 02/05/2023 10:30 A M Medical Record Number: [id number] Patient Account Number: [id number] Date of Birth/Sex: Treating RN: [birth year] (82 y.o. [name] Primary Care Provider: Belva [name] Other Clinician: Referring Provider: Treating Provider/Extender: Jamie [name], Jamie [name] in Treatment: 37 Debridement Performed for Assessment: Wound #5 Right,Lateral Lower Leg Performed By: Physician Jamie Kelp, PA The following information was scribed by: Jamie [name] The information was scribed for: Jamie [name] Debridement Type: Debridement Severity of Tissue Pre Debridement: Fat layer exposed Level of Consciousness (Pre-procedure): Awake and Alert Pre-procedure Verification/Time Out Yes - 10:25 Taken: Start Time: 10:26 Pain Control: Lidocaine 4% T opical Solution Percent of Wound Bed Debrided: 100% T Area Debrided (cm): otal 4.62 Tissue and other material debrided: Viable, Non-Viable, Slough, Subcutaneous, Skin: Dermis , Skin: Epidermis, Slough Level: Skin/Subcutaneous Tissue Debridement Description:  Excisional Instrument: Curette Bleeding: Minimum Hemostasis Achieved: Pressure End Time: 10:31 Procedural Pain: 0 Post Procedural Pain: 0 Response to Treatment: Procedure was tolerated well Level of Consciousness (Post- Awake and Alert procedure): Post Debridement Measurements of Total Wound Length: (cm) 4.9 Width: (cm) 1.2 Depth: (cm) 0.1 Jamie [name], Jamie [name] ([id number]) [id number] Page 2 of 13 Volume: (cm) 0.462 Character of Wound/Ulcer Post Debridement: Improved Severity of Tissue Post Debridement: Fat layer exposed Post Procedure Diagnosis Same as Pre-procedure Electronic Signature(s) Signed: 02/05/2023 4:33:40 PM By: Jamie Derry PA-C Signed: 02/06/2023 6:03:32 PM By: Jamie Stall RN, BSN Entered By: Jamie [name] on 02/05/2023 10:31:14 -------------------------------------------------------------------------------- Debridement Details Patient Name: Date of Service: Jamie [name], Jamie [name] 02/05/2023 10:30 A M Medical Record Number: [id number] Patient Account Number: [id number] Date of Birth/Sex: Treating RN: 04-Mar-1981 (82 y.o. Jamie [name], [place] Primary Care Provider: Belva [name] Other Clinician: Referring Provider: Treating Provider/Extender: Jamie [name], Jamie [name] in Treatment: 37 Debridement Performed for Assessment: Wound #7 Right,Posterior Lower Leg Performed By: Physician Jamie Kelp, PA The following information was scribed by: Jamie [name] The information was scribed for: Jamie [name] Debridement Type: Debridement Severity of Tissue Pre Debridement: Fat layer exposed Level of Consciousness (Pre-procedure): Awake and Alert Pre-procedure Verification/Time Out Yes - 10:25 Taken: Start Time: 10:26 Pain Control: Lidocaine 4% T opical Solution Percent of Wound Bed Debrided: 100% T Area Debrided (cm): otal 8.79 Tissue and other material debrided: Viable, Non-Viable, Slough, Subcutaneous, Skin: Dermis  , Skin: Epidermis, Slough Level: Skin/Subcutaneous Tissue Debridement Description: Excisional Instrument: Curette Bleeding: Minimum Hemostasis Achieved: Pressure End Time: 10:31 Procedural Pain: 0 Post Procedural Pain: 0 Response to Treatment: Procedure was tolerated well Level of Consciousness (Post- Awake and Alert procedure): Post Debridement Measurements of Total Wound Length: (cm) 4 Width: (cm) 2.8 Depth: (cm) 0.1 Volume: (cm) 0.88 Character of Wound/Ulcer Post Debridement: Improved Severity of Tissue Post Debridement: Fat layer exposed Post Procedure Diagnosis Same as Pre-procedure Electronic Signature(s) Signed: 02/05/2023 4:33:40 PM By: Jamie Derry PA-C Signed: 02/06/2023 6:03:32 PM By: Jamie Stall  RN, BSN Entered By: Jamie [name] on 02/05/2023 10:31:37 Jamie [name] ([id number]) [id number] Page 3 of 13 -------------------------------------------------------------------------------- HPI Details Patient Name: Date of Service: Jamie [name], [name]. 02/05/2023 10:30 A M Medical Record Number: [id number] Patient Account Number: [id number] Date of Birth/Sex: Treating RN: July 15, 1980 (82 y.o. F) Primary Care Provider: Belva [name] Other Clinician: Referring Provider: Treating Provider/Extender: Jamie [name], Jamie [name] in Treatment: 37 History of Present Illness HPI Description: ADMISSION 06/30/2020 Jamie [name] is an 82 year old woman who lives in [place]. She is here with her niece for review of wounds on the left medial lower leg and ankle. These have apparently been present for over a year and she followed with Jamie [name] at the [facility] in [place] for quite a period of time although it looks as though there was a initial consult wound from Jamie [name] on February 18 presumably there was therefore hiatus. At that point the wounds were described as being there for 3 months. She also tells me she was  at the wound care center in [place] for a period of time with this. 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 She had venous studies that were negative for DVT ABIs on the right were 1.01 on the left 1.06. She has . had previous applications of puraply, compression which she does not tolerate very well. She has had several rounds of oral antibiotic therapy. She complains of unrelenting pain and she is seeing Dr. Reece Agar V of pain management apparently was on oxycodone but that did not help. She also had a skin biopsy done by Jamie [name] although we do not have that result. She does not appear to have an arterial issue. I am not completely clear what she has been putting on the wounds lately. 3/31; this patient has a particularly nasty set of wounds on the left medial ankle in the middle of what looks to be hemosiderin deposition secondary to chronic venous insufficiency. She has a lot of pain followed by pain management. Jamie [name] apparently did a biopsy of something on the left leg last fall what I would like to get this result. She has a history of MRSA treatment. I do not believe she had reflux studies but she did have DVT rule out studies. Previous ABIs have not suggested arterial insufficiency 4/7; difficult wounds on the left medial ankle probably chronic venous insufficiency. With considerable effort on behalf of our case manager we were able to finally to speak to somebody at the hospital in [place] [employer] [place] who indicated that no biopsy of this area have been done even though the patient describes this in some detail and is even able to point out where she thinks the biopsy was done. We have been using Sorbact. The PCR culture I did showed polymicrobial identification with Pseudomonas, staph aureus, Peptostreptococcus. All of this and low titers. Resistance genes identified were MRSA, staph virulence gene and tetracycline. We are going to send this to  [facility] for a topical antibiotic which is something that we have had good success with recently in large venous ulcers with a lot of purulent drainage. There would not be an easy oral alternative here possibly line escalated and ciprofloxacin if we need to use systemic antibiotic 4/15; difficult area on the left medial ankle. Most likely chronic venous insufficiency. I think she will probably need venous reflux study I think she had DVT rule outs but not venous reflux studies. We have  not yet obtained the topical antibiotics. She has home health changing the dressing we have been using Sorbact for adherent fibrinous debris on the surface. Very difficult to remove 4/22; patient presents for 1 week follow-up. She has been using sore back under compression wraps and these are changed 3 times a week with home health. She also had Keystone antibiotics sent to her house and brought them in today. She has no complaints or issues today. 5/2; patient is here for follow-up. She has been using Sorbact under compression. Very painful wound. She has been using Keystone antibiotics. Not much improvement although the more medial part of the wound has cleaned up nicely and the larger part of the wound about 50% slough covered. Part of the issue here is that she had stays at both [facility] wound care center, [facility] wound care center and now [place]. Not sure if she has had venous reflux studies. As far as we are able to tell she did not have a biopsy. My notes state that she did not have venous reflux studies just DVT rule outs. 5/16; patient goes for venous reflux studies this afternoon. She says that wound was biopsied which sounds like punch biopsies by Dr. Lynden Ang we do not have these results. We are using Sorbact. Very difficult wound to debride 5/23; patient presents for 1 week follow-up. She reports tolerating the wraps well with sorbact underneath. She had ABIs and venous reflux studies done. She has no issues or  complaints today. She denies acute signs of infection. 6/6; I have reviewed the patient's vascular studies. Wounds are on the left medial and posterior calf. She had significant reflux in the greater saphenous vein in the in the mid thigh, distal thigh knee and the small saphenous vein in the popliteal fossa. The vein diameters do not look too impressive though. She is going to see the vascular surgeon on Wednesday. She also had venous reflux in the right common femoral vein. She did not have any evidence of a DVT or SVT I am wondering whether there is an ablation procedure that would benefit her in the greater saphenous vein on the left. . She tells [place] that home health put the dressing on too tight and she took off 1 layer. The swelling in her left leg is a little worse as a result of this. Not much change in the wound measurements so the surface of the wound looks better Her arterial studies showed an ABI on the right of 1.14 with a triphasic waveform and a great toe pressure of 0.89. On the left her ABIs were noncompressible at 1.34 but with triphasic waveforms and a TBI of 0.97. Her greater toe pressure was 122. There was no evidence of significant bilateral arterial disease 6/20 patient went to see Dr. Durwin Nora. He did not think she had significant arterial disease. In terms of her venous duplex on the right side there was no evidence of a DVT or SVT there was deep venous reflux involving the common femoral vein no superficial vein reflux on the left side there was no DVT or SVT there was no deep vein graft reflux there was some reflux in the greater saphenous vein from the mid thigh to the knee but the vein here was not dilated. He thought these were venous wounds he prescribed a compression pump but I am not sure who we ordered it from The patient has been approved for Apligraf. Still using silver collagen this week 7/5; Apligraf #1 7/19 Apligraf #  2. Decent improvement in the condition of the  wound bed. Epithelialization distal 8/2 Apligraf #3. No issues or complaints. Denies signs of infection. 8/17 Apligraf #4. No issues or concerns. Some complaints of pruritus and the rash. Our intake nurse brought up the fact that she had previously indicated possible cotton layer sensitivity we will therefore use kerlix in the bottom layer of the compression 8/30; the patient comes in with the area on her left medial leg just about healed. There is a superficial area more towards the tibia and a smaller open area distally everything else is epithelialized. I do not think she requires another Apligraf 9/13; left medial leg is healed. 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 Her edema control is good Readmisstion: Jamie [name], Jamie [name] ([id number]) [id number] Page 4 of 13 05-22-2022 upon evaluation today patient presents for initial inspection here in our clinic concerning issues that she has been having with a wound of the right lateral lower extremity. This is an area of a previous skin graft she tells me. With that being said she unfortunately had a scrape on this that occurred around 10 January. Since that time she has noted that this has just continued to get bigger and bigger in her niece who is present with her today actually states that 2 [name] ago when she saw that it was significantly smaller than what she sees currently. Obviously this is of utmost concern as we do not want this to continue to get larger when arrested and get moving in the right direction. Fortunately there does not appear to be any signs of systemic infection though locally I think we probably do have some infection present. I would obtain a culture to see what we have going on here and then we will subsequently see where things go going forward. Fortunately I think that she is in the right place at this point being at the wound center we  can definitely do something to try to get this moving in the right direction. Patient does have a history of chronic venous insufficiency as well as coronary artery disease. In the past she has done well with compression wraps on the start with a 3 layer wrap I think she is probably can end up going to a 4-layer at some point but we will see how things do over the next week. She has been using wound cleanser along with she tells me a zinc and topical but again I am not sure exactly what that was. 05-29-2022 upon evaluation today patient appears to be doing well currently in regard to her wound. This actually is showing signs of improvement I am happy in that regard unfortunately her left leg has an area on the shin that has opened. This is the region where one of the areas at least we have previously taken care of. Nonetheless this is small hoping we get it under control before things worsen significantly. 06-05-2022 upon evaluation today patient appears to be doing well currently in regard to her wounds. The actually seem to be fairly clean she is having still quite a bit of problem with pain at this point she would like to not have debridement today for all possible. With that being said I do believe that we are making some good progress I think the [facility] is doing a decent job here. 3/6; patient has had 3/6; patient with known severe chronic venous insufficiency. She apparently had a  traumatic wound at home and has a reasonably substantial wound on the right lateral lower leg and a smaller one on the left anterior lower leg as well. We have been using Hydrofera Blue and Unna boots 3/13; patient presents for follow-up. We have been using Hydrofera Blue under Coflex to the legs bilaterally. She has no issues or complaints today. 06-26-2022 upon evaluation today patient appears to be doing well currently in regard to her wound. This is measuring a little bit larger however. Fortunately I do not  see any signs of infection this is on the right side. Fortunately the left side is actually completely healed which is great news. 07-03-2022 patient's wound unfortunately is continuing to show signs of being worse. Her compression which was the Tubigrip that I thought should be able to pull up actually slipped down and she was not able to pull that back up. With that being said that means that unfortunately her wound has continued to deteriorate since I last saw her. This is definitely not the direction that we are looking for. I discussed with her that I do believe we need to go ahead and see about getting her started on antibiotics and I subsequently would also like to go ahead and see about getting things moving forward with regard to a wound culture and making a change up her dressings as well but this can be changed more frequently than just once a week. 07-10-2022 upon evaluation today patient actually appears to be doing much better. I do believe that the antibiotics have been beneficial for her. Fortunately I do not see any signs of active infection locally nor systemically which is great news. No fevers, chills, nausea, vomiting, or diarrhea. 07-17-2022 upon evaluation today patient appears to be doing well currently in regard to her wound which is actually showing signs of improvement this is slow but nonetheless prevalent that we are seeing good improvement here. Fortunately I do not see any signs of active infection locally nor systemically which is great news. 07-24-2022 upon evaluation today patient appears to be doing well currently in regard to her wound although the wrap that we had on has been slipping down and she really does not have anybody to help her with this at home health is not going to be coming out we could not get anybody that would actually be able to do the dressing changes. Fortunately I do not see any signs of active infection locally nor systemically which is great  news. 08-07-22 poorly in regard to her leg compared to what it was previous. Fortunately there does not appear to be any signs of active infection locally nor systemically which is great news. No fevers, chills, nausea, vomiting, or diarrhea. With that being said it does appear that the patient may have some cellulitis in the leg which is not good. 08-14-2022 upon evaluation today patient appears to be doing somewhat better in regard to her wounds she still is having quite a bit of pain we are still not where we want to be as far as healing is concerned completely. Fortunately I do not see any evidence of active infection locally nor systemically which is great news and I am very pleased in that regard. 08-21-23 upon evaluation today patient appears to be doing poorly currently in regard to her wound. She has been tolerating the dressing changes without complication. Fortunately there does not appear to be any signs of active infection locally nor systemically at this time. 08-28-2022 upon  evaluation today patient appears to be doing poorly in regard to her wound this was not wrapped appropriately home health did not go up high enough on the wrap. This has caused some issues and I discussed that with the patient today. I do believe that she is going to require aggressive wrapping and treatment which she is getting the [facility] topical antibiotics today they can start using that at the next wrap on Friday. In the meantime they need to make sure that the rapid and appropriately we wrote very specific orders today. 09-04-2022 upon evaluation today patient appears to be doing well currently in regard to her wound which I think is making progress is still hurting her quite significantly. Fortunately I do not see any signs of active infection locally or systemically which is great news. No fevers, chills, nausea, vomiting, or diarrhea. 09-11-2022 upon evaluation today patient's wound actually is showing signs of  being a little bit smaller and looking a little bit better she still has a lot going on here however. Fortunately I do not see any evidence of active infection Worsening locally nor systemically which is great news. With that being said worsening still continue to use the topical [facility]stone antibiotics. 09-18-2022 upon evaluation today patient actually showing some signs of improvement. I am actually very pleased with where we stand compared to where we have been. I think that she is making good headway here. She is still having quite a bit of pain but it seems to be lessening compared to previous. 09-25-2022 upon evaluation patient's wound actually showing signs slowly but surely of improvement. Fortunately I do not see any signs of active infection at this time systemically and locally I feel like this is dramatically improved. She is doing well with the [facility] topical antibiotics. 10-02-2022 upon evaluation today patient appears to be doing better in regard to her wound overall. I am very pleased with where things stand from a visual standpoint I do not see any signs of active infection and in general I think that we are moving in the right direction here. 10-09-2022 upon evaluation today patient appears to be doing well currently in regard to her wound. She has been tolerating the dressing changes without complication. Fortunately I do not see any signs of active infection at this time which is great news. 10-16-2022 upon evaluation today patient continues to have quite a bit of pain in regard to her right leg. We have been attempting to debride little by little as we could but again was pretty limited by the fact that she is having significant discomfort. We do not actually have any signs of active infection going on at this point that the [facility] topical antibiotics have been helping quite readily. I been attempting to do as much debridement as I can but if she were to have pain medication this would  actually help and in the past when she did it was much more tolerable for her as far as taking the edge off. Right now she has been without as she is in transition from her previous pain management physician to someone new to manage this. 10-23-2022 upon evaluation patient appears to be doing excellent in regard to her wound. She has been tolerating the dressing changes without complication. Fortunately I do not see any evidence of active infection locally nor systemically which is great news. No fevers, chills, nausea, vomiting, or diarrhea. 7/24; patient with a wound secondary to chronic venous insufficiency on the right lateral lower  leg. We have been using [facility] antibiotic [address] SCOTLYNN, Jamie [name] ([id number]) [id number] Page 5 of 13 under kerlix [place]. She complains of a lot of pain after last week's debridement otherwise things appear to be improved per discussion with our intake nurse. 11-06-2022 upon evaluation today patient appears to be doing excellent at this point in regard to her wound. She has been tolerating the dressing changes without complication and to be honest she is making excellent progress towards complete closure. I am actually very pleased with where we stand I think that she is doing excellent as far as the overall appearance of the wound is concerned as well. She is going require some debridement however. 11-20-2022 upon evaluation today patient appears to be doing well currently in regard to her wound. She has been tolerating the dressing changes without complication. Fortunately there does not appear to be any signs of active infection locally or systemically which is great news and in general I do believe that we are moving in the right direction here. No fevers, chills, nausea, vomiting, or diarrhea. 11-27-2022 upon evaluation today patient appears to be doing a little better in regards to the overall appearance of her wound but  unfortunately she is having increased pain. The collagen seems to be getting very dry and stuck to the wound bed which is causing her some issues here. Fortunately I do not see any signs of active infection locally nor systemically at this time. Fortunately I think that her wound is better but unfortunately her pain has not. For that reason I am going to avoid any debridement today since the patient is very upset about the pain and how bad this is hurting at this point. I think we can have to try something a little bit different I am thinking PolyMem may be a good option. 8/28; this patient has difficult venous wounds on the right lateral lower leg and ankle in the setting of chronic venous insufficiency. We have been using polymen under compression with kerlix Coban. 12-11-2022 upon evaluation today patient appears to be doing well currently in regard to her wound. She has been tolerating the dressing changes without complication and actually appears to be doing much better this week compared to when I saw her 2 [name] ago. The wound is measuring significantly smaller. We are using PolyMem along with Kerlix and Coban. 12-18-2022 upon evaluation today patient appears to be doing well currently in regard to her wound. This is actually showing signs of improvement is measuring a little bit smaller and looking better. Fortunately I do not see any evidence of worsening overall and I believe that we will making good headway with the PolyMem and the [facility] topical antibiotics. 12-25-2022 upon evaluation today patient appears to be doing well currently in regard to her wound. She has been tolerating the dressing changes without complication. Fortunately I do not see any evidence of infection locally or systemically which is great news and in general I do believe that we will make an excellent headway towards complete closure also excellent news. 01-01-2023 upon evaluation patient's wound actually showing signs  of improvement the wrap was actually put on properly this week and this is good news. Overall I am extremely happy with where things stand and how this appears today I do not see any signs of worsening overall and I believe that the patient is making excellent headway towards complete closure which is great news. 01/08/2023 upon evaluation today patient appears to be doing well  currently in regard to her leg. She is actually draining much less unfortunately home health is just not doing very well getting the supplies necessary in order to continue to take care of the patient. They are now telling me they cannot get the PolyMem which is something that has been doing really well for her. That really does not make sense to me you came to get PolyMem for a hospice patient. Nonetheless either way I think we may need to just discontinue treatment with home health and just manage the patient here at the clinic. 01-15-2023 upon evaluation today patient appears to be doing well currently in regard to her wound. She has been tolerating the dressing changes without complication. Fortunately I do not see any signs of infection I think she is doing quite well and very pleased with where we stand today. No fevers, chills, nausea, vomiting, or diarrhea. 01-22-2023 upon evaluation today patient appears to be doing well currently in regard to her wound. She has been tolerating the dressing changes without complication and both wounds are measuring smaller today and look to be doing excellent. I am actually very pleased with what ever standing here and I think that she is making really good headway towards closure which is great news. 01-29-2023 upon evaluation today patient actually tells me she has been having some increased pain. Subsequently it took a little bit of drilling down but in the end I realized that we actually ended up putting a full Unna boot on her last week as opposed to just using a good anchor at the  top and bottom. I think this is what wound up with her having increased discomfort because she tells me as she was doing well when she came in last time and by the time she left she tells me this was already getting significantly worse. And she hurt most of the week. With that being said I do believe that the patient is doing quite well at this point with regard to her wounds and the wrap did okay it was not a problem other than the increased pain I think we may benefit from a Urgo K2 light compression wrap as this will be much more comfortable I think compared to what we have been doing with just the Kerlix Coban anyway. 02-05-2023 upon evaluation today patient appears to be doing well currently in regard to her wounds. She has been tolerating the dressing changes without complication. Fortunately I do not see any signs of active infection looking systemically which is great news. No fevers, chills, nausea, vomiting, or diarrhea. Electronic Signature(s) Signed: 02/05/2023 10:45:26 AM By: Jamie Derry PA-C Entered By: Jamie [name] on 02/05/2023 10:45:26 -------------------------------------------------------------------------------- Physical Exam Details Patient Name: Date of Service: Jamie Krill. 02/05/2023 10:30 A M Medical Record Number: [id number] Patient Account Number: [id number] Date of Birth/Sex: Treating RN: Jun 15, 1940 (82 y.o. F) Primary Care Provider: Belva [name] Other Clinician: Referring Provider: Treating Provider/Extender: Jamie [name], Jamie [name] in Treatment: 37 Constitutional Well-nourished and well-hydrated in no acute distress. Respiratory normal breathing without difficulty. CAYLAN, CHENARD ([id number]) [id number] Page 6 of 13 Psychiatric this patient is able to make decisions and demonstrates good insight into disease process. Alert and Oriented x 3. pleasant and cooperative. Notes Upon inspection  patient's wound bed actually showed signs of good granulation epithelization at this point. Fortunately I do not see any evidence of worsening overall I do believe that the patient is making excellent headway towards closure which  is great news. No fevers, chills, nausea, vomiting, or diarrhea. Electronic Signature(s) Signed: 02/05/2023 10:45:42 AM By: Jamie Derry PA-C Entered By: Jamie [name] on 02/05/2023 10:45:42 -------------------------------------------------------------------------------- Physician Orders Details Patient Name: Date of Service: Jamie [name], Jamie [name] 02/05/2023 10:30 A M Medical Record Number: [id number] Patient Account Number: [id number] Date of Birth/Sex: Treating RN: 10-Oct-1940 (82 y.o. 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 [place] [facility] Wednesday (already scheduled) 02/12/2023 1015 Return A ppointment in 2 [name]. [place] [facility] Wednesday (already scheduled) Return A Return appointment in 3 [name]. [place] [facility] Wednesday (already scheduled) Return appointment in 1 month. [place] [facility] Wednesday (already scheduled) Anesthetic Wound #5 Right,Lateral Lower Leg (In clinic) Topical Lidocaine 4% applied to wound bed Bathing/ Shower/ Hygiene May shower and wash wound with soap and water. - with dressing changes. Edema Control - Orders /  Instructions Elevate legs to the level of the heart or above for 30 minutes daily and/or when sitting for 3-4 times a day throughout the day. Avoid standing for long periods of time. Exercise regularly Wound Treatment Wound #5 - Lower Leg Wound Laterality: Right, Lateral Cleanser: Soap and Water 1 x Per Week/30 Days Discharge Instructions: May shower and wash wound with dial antibacterial soap and water prior to dressing change. Peri-Wound Care: Sween Lotion (Moisturizing lotion) 1 x Per Week/30 Days Discharge Instructions: Apply moisturizing lotion as directed Topical: compounding topical antibiotics 1 x Per Week/30 Days Discharge Instructions: apply Keystone directly to wound bed under the hydrofera blue. Home Health start. MIX ACCORDING TO THE PHARMACY INSTRUCTIONS. Prim Dressing: PolyMem Non-Adhesive Dressing, 4x4 in 1 x Per Week/30 Days ary Discharge Instructions: ******CUT SLITS INTO THE POLYMEM.***** Apply to wound bed as instructed Secondary Dressing: ABD Pad, 8x10 1 x Per Week/30 Days Discharge Instructions: Apply over primary dressing as directed. Jamie [name], Jamie [name] ([id number]) [id number] Page 7 of 13 Secondary Dressing: Woven Gauze Sponge, Non-Sterile 4x4 in 1 x Per Week/30 Days Discharge Instructions: Apply over primary dressing as directed. Secondary Dressing: Zetuvit Plus 4x8 in 1 x Per Week/30 Days Discharge Instructions: Or double ABD pad Apply over primary dressing as directed. Compression Wrap: Urgo K2 Lite, (equivalent to a 3 layer) two layer compression system, regular 1 x Per Week/30 Days Discharge Instructions: Apply Urgo K2 Lite as directed (alternative to 3 layer compression). Compression Wrap: unna boot FIRST LAYER **** SEE INSTRUCTIONS 1 x Per Week/30 Days Discharge Instructions: APPLY FIRST LAYER UNNA BOOT AT BASES OF TOES AND JUST BELOW THE KNEE TO HOLD COMPRESSION WRAP IN PLACE. Wound #7 - Lower Leg Wound Laterality: Right,  Posterior Cleanser: Soap and Water 1 x Per Week/30 Days Discharge Instructions: May shower and wash wound with dial antibacterial soap and water prior to dressing change. Peri-Wound Care: Sween Lotion (Moisturizing lotion) 1 x Per Week/30 Days Discharge Instructions: Apply moisturizing lotion as directed Topical: compounding topical antibiotics 1 x Per Week/30 Days Discharge Instructions: apply Keystone directly to wound bed under the hydrofera blue. Home Health start. MIX ACCORDING TO  THE PHARMACY INSTRUCTIONS. Prim Dressing: PolyMem Non-Adhesive Dressing, 4x4 in 1 x Per Week/30 Days ary Discharge Instructions: ******CUT SLITS INTO THE POLYMEM.***** Apply to wound bed as instructed Secondary Dressing: ABD Pad, 8x10 1 x Per Week/30 Days Discharge Instructions: Apply over primary dressing as directed. Secondary Dressing: Woven Gauze Sponge, Non-Sterile 4x4 in 1 x Per Week/30 Days Discharge Instructions: Apply over primary dressing as directed. Secondary Dressing: Zetuvit Plus 4x8 in 1 x Per Week/30 Days Discharge Instructions: Or double ABD pad Apply over primary dressing as directed. Compression Wrap: Urgo K2 Lite, (equivalent to a 3 layer) two layer compression system, regular 1 x Per Week/30 Days Discharge Instructions: Apply Urgo K2 Lite as directed (alternative to 3 layer compression). Compression Wrap: unna boot FIRST LAYER **** SEE INSTRUCTIONS 1 x Per Week/30 Days Discharge Instructions: APPLY FIRST LAYER UNNA BOOT AT BASES OF TOES AND JUST BELOW THE KNEE TO HOLD COMPRESSION WRAP IN PLACE. Electronic Signature(s) Signed: 02/05/2023 4:33:40 PM By: Jamie Derry PA-C Signed: 02/06/2023 6:03:32 PM By: Jamie Stall RN, BSN Entered By: Jamie [name] on 02/05/2023 10:29:48 -------------------------------------------------------------------------------- Problem List Details Patient Name: Date of Service: Jamie [name], Jamie [name] 02/05/2023 10:30 A M Medical Record Number:  [id number] Patient Account Number: [id number] Date of Birth/Sex: Treating RN: January 25, 1941 (82 y.o. F) Primary Care Provider: Belva [name] Other Clinician: Referring Provider: Treating Provider/Extender: Jamie [name], Jamie [name] in Treatment: 37 Active Problems ICD-10 Encounter Code Description Active Date MDM Diagnosis I87.331 Chronic venous hypertension (idiopathic) with ulcer and inflammation of right 05/22/2022 No Yes lower extremity CARMA, DWIGGINS ([id number]) [id number] Page 8 of 13 [phone] Non-pressure chronic ulcer of other part of right lower leg with fat layer 05/22/2022 No Yes exposed I25.10 Atherosclerotic heart disease of native coronary artery without angina pectoris 05/22/2022 No Yes Inactive Problems Resolved Problems Electronic Signature(s) Signed: 02/05/2023 9:47:39 AM By: Jamie Derry PA-C Entered By: Jamie [name] on 02/05/2023 09:47:39 -------------------------------------------------------------------------------- Progress Note Details Patient Name: Date of Service: Gevena Mart W. 02/05/2023 10:30 A M Medical Record Number: [id number] Patient Account Number: [id number] Date of Birth/Sex: Treating RN: 16-Jul-1940 (82 y.o. F) Primary Care Provider: Belva [name] Other Clinician: Referring Provider: Treating Provider/Extender: Jamie [name], Jamie [name] in Treatment: 37 Subjective Chief Complaint Information obtained from Patient Right LE Ulcer History of Present Illness (HPI) ADMISSION 06/30/2020 Mrs. Parisi is an 82 year old woman who lives in [place]. She is here with her niece for review of wounds on the left medial lower leg and ankle. These have apparently been present for over a year and she followed with Jamie [name] at the [facility] in [place] for quite a period of time although it looks as though there was a initial consult wound from Jamie [name] on February 18  presumably there was therefore hiatus. At that point the wounds were described as being there for 3 months. She also tells me she was at the wound care center in [place] for a period of time with this. 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 She had venous studies that were negative for DVT ABIs on the right were 1.01 on the left 1.06. She has . had previous applications of puraply, compression which she does not tolerate very well. She has had several rounds of oral antibiotic therapy. She complains of unrelenting pain and she is seeing Dr. Reece Agar V of pain management apparently was on oxycodone but that did not help. She also had a  skin biopsy done by Jamie [name] although we do not have that result. She does not appear to have an arterial issue. I am not completely clear what she has been putting on the wounds lately. 3/31; this patient has a particularly nasty set of wounds on the left medial ankle in the middle of what looks to be hemosiderin deposition secondary to chronic venous insufficiency. She has a lot of pain followed by pain management. Jamie [name] apparently did a biopsy of something on the left leg last fall what I would like to get this result. She has a history of MRSA treatment. I do not believe she had reflux studies but she did have DVT rule out studies. Previous ABIs have not suggested arterial insufficiency 4/7; difficult wounds on the left medial ankle probably chronic venous insufficiency. With considerable effort on behalf of our case manager we were able to finally to speak to somebody at the hospital in [place] who indicated that no biopsy of this area have been done even though the patient describes this in some detail and is even able to point out where she thinks the biopsy was done. We have been using Sorbact. The PCR culture I did showed polymicrobial identification with Pseudomonas, staph aureus, Peptostreptococcus. All of  this and low titers. Resistance genes identified were MRSA, staph virulence gene and tetracycline. We are going to send this to [facility] for a topical antibiotic which is something that we have had good success with recently in large venous ulcers with a lot of purulent drainage. There would not be an easy oral alternative here possibly line escalated and ciprofloxacin if we need to use systemic antibiotic 4/15; difficult area on the left medial ankle. Most likely chronic venous insufficiency. I think she will probably need venous reflux study I think she had DVT rule outs but not venous reflux studies. We have not yet obtained the topical antibiotics. She has home health changing the dressing we have been using Sorbact for adherent fibrinous debris on the surface. Very difficult to remove 4/22; patient presents for 1 week follow-up. She has been using sore back under compression wraps and these are changed 3 times a week with home health. She also had Keystone antibiotics sent to her house and brought them in today. She has no complaints or issues today. 5/2; patient is here for follow-up. She has been using Sorbact under compression. Very painful wound. She has been using Keystone antibiotics. Not much improvement although the more medial part of the wound has cleaned up nicely and the larger part of the wound about 50% slough covered. Part of the issue here is that she had stays at both [facility] wound care center, [facility] wound care center and now [place]. Not sure if she has had venous reflux studies. As far as we are able to tell she did not have a biopsy. My notes state that she did not have venous reflux studies just DVT rule outs. Jamie [name], Jamie [name] ([id number]) [id number] Page 9 of 13 5/16; patient goes for venous reflux studies this afternoon. She says that wound was biopsied which sounds like punch biopsies by Dr. Lynden Ang we do not have these results. We are using  Sorbact. Very difficult wound to debride 5/23; patient presents for 1 week follow-up. She reports tolerating the wraps well with sorbact underneath. She had ABIs and venous reflux studies done. She has no issues or complaints today. She denies acute signs of infection. 6/6; I have reviewed the patient's vascular  studies. Wounds are on the left medial and posterior calf. She had significant reflux in the greater saphenous vein in the in the mid thigh, distal thigh knee and the small saphenous vein in the popliteal fossa. The vein diameters do not look too impressive though. She is going to see the vascular surgeon on Wednesday. She also had venous reflux in the right common femoral vein. She did not have any evidence of a DVT or SVT I am wondering whether there is an ablation procedure that would benefit her in the greater saphenous vein on the left. . She tells [place] that home health put the dressing on too tight and she took off 1 layer. The swelling in her left leg is a little worse as a result of this. Not much change in the wound measurements so the surface of the wound looks better Her arterial studies showed an ABI on the right of 1.14 with a triphasic waveform and a great toe pressure of 0.89. On the left her ABIs were noncompressible at 1.34 but with triphasic waveforms and a TBI of 0.97. Her greater toe pressure was 122. There was no evidence of significant bilateral arterial disease 6/20 patient went to see Dr. Durwin Nora. He did not think she had significant arterial disease. In terms of her venous duplex on the right side there was no evidence of a DVT or SVT there was deep venous reflux involving the common femoral vein no superficial vein reflux on the left side there was no DVT or SVT there was no deep vein graft reflux there was some reflux in the greater saphenous vein from the mid thigh to the knee but the vein here was not dilated. He thought these were venous wounds he prescribed a  compression pump but I am not sure who we ordered it from The patient has been approved for Apligraf. Still using silver collagen this week 7/5; Apligraf #1 7/19 Apligraf #2. Decent improvement in the condition of the wound bed. Epithelialization distal 8/2 Apligraf #3. No issues or complaints. Denies signs of infection. 8/17 Apligraf #4. No issues or concerns. Some complaints of pruritus and the rash. Our intake nurse brought up the fact that she had previously indicated possible cotton layer sensitivity we will therefore use kerlix in the bottom layer of the compression 8/30; the patient comes in with the area on her left medial leg just about healed. There is a superficial area more towards the tibia and a smaller open area distally everything else is epithelialized. I do not think she requires another Apligraf 9/13; left medial leg is healed. 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 Her edema control is good Readmisstion: 05-22-2022 upon evaluation today patient presents for initial inspection here in our clinic concerning issues that she has been having with a wound of the right lateral lower extremity. This is an area of a previous skin graft she tells me. With that being said she unfortunately had a scrape on this that occurred around 10 January. Since that time she has noted that this has just continued to get bigger and bigger in her niece who is present with her today actually states that 2 [name] ago when she saw that it was significantly smaller than what she sees currently. Obviously this is of utmost concern as we do not want this to continue to get larger when arrested and get moving in the right direction. Fortunately there does not appear  to be any signs of systemic infection though locally I think we probably do have some infection present. I would obtain a culture to see what we have going on here and then we will  subsequently see where things go going forward. Fortunately I think that she is in the right place at this point being at the wound center we can definitely do something to try to get this moving in the right direction. Patient does have a history of chronic venous insufficiency as well as coronary artery disease. In the past she has done well with compression wraps on the start with a 3 layer wrap I think she is probably can end up going to a 4-layer at some point but we will see how things do over the next week. She has been using wound cleanser along with she tells me a zinc and topical but again I am not sure exactly what that was. 05-29-2022 upon evaluation today patient appears to be doing well currently in regard to her wound. This actually is showing signs of improvement I am happy in that regard unfortunately her left leg has an area on the shin that has opened. This is the region where one of the areas at least we have previously taken care of. Nonetheless this is small hoping we get it under control before things worsen significantly. 06-05-2022 upon evaluation today patient appears to be doing well currently in regard to her wounds. The actually seem to be fairly clean she is having still quite a bit of problem with pain at this point she would like to not have debridement today for all possible. With that being said I do believe that we are making some good progress I think the [facility] is doing a decent job here. 3/6; patient has had 3/6; patient with known severe chronic venous insufficiency. She apparently had a traumatic wound at home and has a reasonably substantial wound on the right lateral lower leg and a smaller one on the left anterior lower leg as well. We have been using Hydrofera Blue and Unna boots 3/13; patient presents for follow-up. We have been using Hydrofera Blue under Coflex to the legs bilaterally. She has no issues or complaints today. 06-26-2022 upon evaluation  today patient appears to be doing well currently in regard to her wound. This is measuring a little bit larger however. Fortunately I do not see any signs of infection this is on the right side. Fortunately the left side is actually completely healed which is great news. 07-03-2022 patient's wound unfortunately is continuing to show signs of being worse. Her compression which was the Tubigrip that I thought should be able to pull up actually slipped down and she was not able to pull that back up. With that being said that means that unfortunately her wound has continued to deteriorate since I last saw her. This is definitely not the direction that we are looking for. I discussed with her that I do believe we need to go ahead and see about getting her started on antibiotics and I subsequently would also like to go ahead and see about getting things moving forward with regard to a wound culture and making a change up her dressings as well but this can be changed more frequently than just once a week. 07-10-2022 upon evaluation today patient actually appears to be doing much better. I do believe that the antibiotics have been beneficial for her. Fortunately I do not see any signs of  active infection locally nor systemically which is great news. No fevers, chills, nausea, vomiting, or diarrhea. 07-17-2022 upon evaluation today patient appears to be doing well currently in regard to her wound which is actually showing signs of improvement this is slow but nonetheless prevalent that we are seeing good improvement here. Fortunately I do not see any signs of active infection locally nor systemically which is great news. 07-24-2022 upon evaluation today patient appears to be doing well currently in regard to her wound although the wrap that we had on has been slipping down and she really does not have anybody to help her with this at home health is not going to be coming out we could not get anybody that would  actually be able to do the dressing changes. Fortunately I do not see any signs of active infection locally nor systemically which is great news. 08-07-22 poorly in regard to her leg compared to what it was previous. Fortunately there does not appear to be any signs of active infection locally nor systemically which is great news. No fevers, chills, nausea, vomiting, or diarrhea. With that being said it does appear that the patient may have some cellulitis in the leg which is not good. 08-14-2022 upon evaluation today patient appears to be doing somewhat better in regard to her wounds she still is having quite a bit of pain we are still not where we want to be as far as healing is concerned completely. Fortunately I do not see any evidence of active infection locally nor systemically which is great news and I am very pleased in that regard. 08-21-23 upon evaluation today patient appears to be doing poorly currently in regard to her wound. She has been tolerating the dressing changes without complication. Fortunately there does not appear to be any signs of active infection locally nor systemically at this time. 08-28-2022 upon evaluation today patient appears to be doing poorly in regard to her wound this was not wrapped appropriately home health did not go up high enough on the wrap. This has caused some issues and I discussed that with the patient today. I do believe that she is going to require aggressive wrapping and treatment which she is getting the [facility] topical antibiotics today they can start using that at the next wrap on Friday. In the meantime they need to make Jamie [name], Jamie [name] ([id number]) [phone].pdf Page 10 of 13 sure that the rapid and appropriately we wrote very specific orders today. 09-04-2022 upon evaluation today patient appears to be doing well currently in regard to her wound which I think is making progress is still hurting her quite significantly.  Fortunately I do not see any signs of active infection locally or systemically which is great news. No fevers, chills, nausea, vomiting, or diarrhea. 09-11-2022 upon evaluation today patient's wound actually is showing signs of being a little bit smaller and looking a little bit better she still has a lot going on here however. Fortunately I do not see any evidence of active infection Worsening locally nor systemically which is great news. With that being said worsening still continue to use the topical Keystone antibiotics. 09-18-2022 upon evaluation today patient actually showing some signs of improvement. I am actually very pleased with where we stand compared to where we have been. I think that she is making good headway here. She is still having quite a bit of pain but it seems to be lessening compared to previous. 09-25-2022 upon evaluation patient's wound actually showing  signs slowly but surely of improvement. Fortunately I do not see any signs of active infection at this time systemically and locally I feel like this is dramatically improved. She is doing well with the [facility] topical antibiotics. 10-02-2022 upon evaluation today patient appears to be doing better in regard to her wound overall. I am very pleased with where things stand from a visual standpoint I do not see any signs of active infection and in general I think that we are moving in the right direction here. 10-09-2022 upon evaluation today patient appears to be doing well currently in regard to her wound. She has been tolerating the dressing changes without complication. Fortunately I do not see any signs of active infection at this time which is great news. 10-16-2022 upon evaluation today patient continues to have quite a bit of pain in regard to her right leg. We have been attempting to debride little by little as we could but again was pretty limited by the fact that she is having significant discomfort. We do not actually have  any signs of active infection going on at this point that the [facility] topical antibiotics have been helping quite readily. I been attempting to do as much debridement as I can but if she were to have pain medication this would actually help and in the past when she did it was much more tolerable for her as far as taking the edge off. Right now she has been without as she is in transition from her previous pain management physician to someone new to manage this. 10-23-2022 upon evaluation patient appears to be doing excellent in regard to her wound. She has been tolerating the dressing changes without complication. Fortunately I do not see any evidence of active infection locally nor systemically which is great news. No fevers, chills, nausea, vomiting, or diarrhea. 7/24; patient with a wound secondary to chronic venous insufficiency on the right lateral lower leg. We have been using Keystone antibiotic Hydrofera Blue under kerlix Coban. She complains of a lot of pain after last week's debridement otherwise things appear to be improved per discussion with our intake nurse. 11-06-2022 upon evaluation today patient appears to be doing excellent at this point in regard to her wound. She has been tolerating the dressing changes without complication and to be honest she is making excellent progress towards complete closure. I am actually very pleased with where we stand I think that she is doing excellent as far as the overall appearance of the wound is concerned as well. She is going require some debridement however. 11-20-2022 upon evaluation today patient appears to be doing well currently in regard to her wound. She has been tolerating the dressing changes without complication. Fortunately there does not appear to be any signs of active infection locally or systemically which is great news and in general I do believe that we are moving in the right direction here. No fevers, chills, nausea, vomiting, or  diarrhea. 11-27-2022 upon evaluation today patient appears to be doing a little better in regards to the overall appearance of her wound but unfortunately she is having increased pain. The collagen seems to be getting very dry and stuck to the wound bed which is causing her some issues here. Fortunately I do not see any signs of active infection locally nor systemically at this time. Fortunately I think that her wound is better but unfortunately her pain has not. For that reason I am going to avoid any debridement today since the  patient is very upset about the pain and how bad this is hurting at this point. I think we can have to try something a little bit different I am thinking PolyMem may be a good option. 8/28; this patient has difficult venous wounds on the right lateral lower leg and ankle in the setting of chronic venous insufficiency. We have been using polymen under compression with kerlix Coban. 12-11-2022 upon evaluation today patient appears to be doing well currently in regard to her wound. She has been tolerating the dressing changes without complication and actually appears to be doing much better this week compared to when I saw her 2 [name] ago. The wound is measuring significantly smaller. We are using PolyMem along with Kerlix and Coban. 12-18-2022 upon evaluation today patient appears to be doing well currently in regard to her wound. This is actually showing signs of improvement is measuring a little bit smaller and looking better. Fortunately I do not see any evidence of worsening overall and I believe that we will making good headway with the PolyMem and the [facility] topical antibiotics. 12-25-2022 upon evaluation today patient appears to be doing well currently in regard to her wound. She has been tolerating the dressing changes without complication. Fortunately I do not see any evidence of infection locally or systemically which is great news and in general I do believe that we  will make an excellent headway towards complete closure also excellent news. 01-01-2023 upon evaluation patient's wound actually showing signs of improvement the wrap was actually put on properly this week and this is good news. Overall I am extremely happy with where things stand and how this appears today I do not see any signs of worsening overall and I believe that the patient is making excellent headway towards complete closure which is great news. 01/08/2023 upon evaluation today patient appears to be doing well currently in regard to her leg. She is actually draining much less unfortunately home health is just not doing very well getting the supplies necessary in order to continue to take care of the patient. They are now telling me they cannot get the PolyMem which is something that has been doing really well for her. That really does not make sense to me you came to get PolyMem for a hospice patient. Nonetheless either way I think we may need to just discontinue treatment with home health and just manage the patient here at the clinic. 01-15-2023 upon evaluation today patient appears to be doing well currently in regard to her wound. She has been tolerating the dressing changes without complication. Fortunately I do not see any signs of infection I think she is doing quite well and very pleased with where we stand today. No fevers, chills, nausea, vomiting, or diarrhea. 01-22-2023 upon evaluation today patient appears to be doing well currently in regard to her wound. She has been tolerating the dressing changes without complication and both wounds are measuring smaller today and look to be doing excellent. I am actually very pleased with what ever standing here and I think that she is making really good headway towards closure which is great news. 01-29-2023 upon evaluation today patient actually tells me she has been having some increased pain. Subsequently it took a little bit of drilling  down but in the end I realized that we actually ended up putting a full Unna boot on her last week as opposed to just using a good anchor at the top and bottom. I think this  is what wound up with her having increased discomfort because she tells me as she was doing well when she came in last time and by the time she left she tells me this was already getting significantly worse. And she hurt most of the week. With that being said I do believe that the patient is doing quite well at this point with regard to her wounds and the wrap did okay it was not a problem other than the increased pain I think we may benefit from a Urgo K2 light compression wrap as this will be much more comfortable I think compared to what we have been doing with just the Kerlix Coban anyway. 02-05-2023 upon evaluation today patient appears to be doing well currently in regard to her wounds. She has been tolerating the dressing changes without complication. Fortunately I do not see any signs of active infection looking systemically which is great news. No fevers, chills, nausea, vomiting, or diarrhea. Jamie [name], Jamie [name] ([id number]) [id number] Page 11 of 13 Objective Constitutional Well-nourished and well-hydrated in no acute distress. Vitals Time Taken: 9:52 AM, Height: 62 in, Weight: 171 lbs, BMI: 31.3, Temperature: 98.9 F, Pulse: 47 bpm, Respiratory Rate: 18 breaths/min, Blood Pressure: 159/87 mmHg. Respiratory normal breathing without difficulty. Psychiatric this patient is able to make decisions and demonstrates good insight into disease process. Alert and Oriented x 3. pleasant and cooperative. General Notes: Upon inspection patient's wound bed actually showed signs of good granulation epithelization at this point. Fortunately I do not see any evidence of worsening overall I do believe that the patient is making excellent headway towards closure which is great news. No fevers, chills,  nausea, vomiting, or diarrhea. Integumentary (Hair, Skin) Wound #5 status is Open. Original cause of wound was Trauma. The date acquired was: 04/17/2022. The wound has been in treatment 37 [name]. The wound is located on the Right,Lateral Lower Leg. The wound measures 4.2cm length x 1.9cm width x 0.1cm depth; 6.267cm^2 area and 0.627cm^3 volume. There is Fat Layer (Subcutaneous Tissue) exposed. There is a medium amount of serosanguineous drainage noted. The wound margin is distinct with the outline attached to the wound base. There is medium (34-66%) red granulation within the wound bed. There is a medium (34-66%) amount of necrotic tissue within the wound bed including Adherent Slough. The periwound skin appearance exhibited: Scarring, Dry/Scaly, Hemosiderin Staining. The periwound skin appearance did not exhibit: Callus, Crepitus, Excoriation, Induration, Rash, Maceration, Atrophie Blanche, Cyanosis, Ecchymosis, Mottled, Pallor, Rubor, Erythema. Periwound temperature was noted as No Abnormality. The periwound has tenderness on palpation. Wound #7 status is Open. Original cause of wound was Gradually Appeared. The date acquired was: 01/01/2023. The wound has been in treatment 5 [name]. The wound is located on the Right,Posterior Lower Leg. The wound measures 4cm length x 2.8cm width x 0.1cm depth; 8.796cm^2 area and 0.88cm^3 volume. There is Fat Layer (Subcutaneous Tissue) exposed. There is a medium amount of serosanguineous drainage noted. The wound margin is distinct with the outline attached to the wound base. There is small (1-33%) red, pink granulation within the wound bed. There is a large (67-100%) amount of necrotic tissue within the wound bed including Adherent Slough. The periwound skin appearance exhibited: Scarring, Hemosiderin Staining. The periwound skin appearance did not exhibit: Callus, Crepitus, Excoriation, Induration, Rash, Dry/Scaly, Maceration, Atrophie Blanche, Cyanosis,  Ecchymosis, Mottled, Pallor, Rubor, Erythema. Periwound temperature was noted as No Abnormality. The periwound has tenderness on palpation. 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 There was a Excisional Skin/Subcutaneous Tissue Debridement with a total area of 4.62 sq cm performed by Jamie Kelp, PA. With the following instrument(s): Curette to remove Viable and Non-Viable tissue/material. Material removed includes Subcutaneous Tissue, Slough, Skin: Dermis, and Skin: Epidermis after achieving pain control using Lidocaine 4% Topical Solution. A time out was conducted at 10:25, prior to the start of the procedure. A Minimum amount of bleeding was controlled with Pressure. The procedure was tolerated well with a pain level of 0 throughout and a pain level of 0 following the procedure. Post Debridement Measurements: 4.9cm length x 1.2cm width x 0.1cm depth; 0.462cm^3 volume. Character of Wound/Ulcer Post Debridement is improved. Severity of Tissue Post Debridement is: Fat layer exposed. Post procedure Diagnosis Wound #5: Same as Pre-Procedure Pre-procedure diagnosis of Wound #5 is a Venous Leg Ulcer located on the Right,Lateral Lower Leg . There was a Three Layer Compression Therapy Procedure by Karie Schwalbe, RN. Post procedure Diagnosis Wound #5: Same as Pre-Procedure Wound #7 Pre-procedure diagnosis of Wound #7 is a Venous Leg Ulcer located on the Right,Posterior Lower Leg .Severity of Tissue Pre Debridement is: Fat layer exposed. There was a Excisional  Skin/Subcutaneous Tissue Debridement with a total area of 8.79 sq cm performed by Jamie Kelp, PA. With the following instrument(s): Curette to remove Viable and Non-Viable tissue/material. Material removed includes Subcutaneous Tissue, Slough, Skin: Dermis, and Skin: Epidermis after achieving pain control using Lidocaine 4% Topical Solution. A time out was conducted at 10:25, prior to the start of the procedure. A Minimum amount of bleeding was controlled with Pressure. The procedure was tolerated well with a pain level of 0 throughout and a pain level of 0 following the procedure. Post Debridement Measurements: 4cm length x 2.8cm width x 0.1cm depth; 0.88cm^3 volume. Character of Wound/Ulcer Post Debridement is improved. Severity of Tissue Post Debridement is: Fat layer exposed. Post procedure Diagnosis Wound #7: Same as Pre-Procedure Jamie [name], Jamie [name] ([id number]) [id number] Page 12 of 13 Pre-procedure diagnosis of Wound #7 is a Venous Leg Ulcer located on the Right,Posterior Lower Leg . There was a Three Layer Compression Therapy Procedure by Karie Schwalbe, RN. Post procedure Diagnosis Wound #7: Same as Pre-Procedure Plan Follow-up Appointments: Return Appointment in 1 week. Leonard Schwartz Wednesday (already scheduled) 02/12/2023 1015 Return Appointment in 2 [name]. Leonard Schwartz Wednesday (already scheduled) Return appointment in 3 [name]. Leonard Schwartz Wednesday (already scheduled) Return appointment in 1 month. Leonard Schwartz Wednesday (already scheduled) Anesthetic: Wound #5 Right,Lateral Lower Leg: (In clinic) Topical Lidocaine 4% applied to wound bed Bathing/ Shower/ Hygiene: May shower and wash wound with soap and water. - with dressing changes. Edema Control - Orders / Instructions: Elevate legs to the level of the heart or above for 30 minutes daily and/or when sitting for 3-4 times a day throughout the day. Avoid standing for long periods of time. Exercise regularly WOUND  #5: - Lower Leg Wound Laterality: Right, Lateral Cleanser: Soap and Water 1 x Per Week/30 Days Discharge Instructions: May shower and wash wound with dial antibacterial soap and water prior to dressing change. Peri-Wound Care: Sween Lotion (Moisturizing lotion) 1 x Per Week/30 Days Discharge Instructions: Apply  moisturizing lotion as directed Topical: compounding topical antibiotics 1 x Per Week/30 Days Discharge Instructions: apply Keystone directly to wound bed under the hydrofera blue. Home Health start. MIX ACCORDING TO THE PHARMACY INSTRUCTIONS. Prim Dressing: PolyMem Non-Adhesive Dressing, 4x4 in 1 x Per Week/30 Days ary Discharge Instructions: ******CUT SLITS INTO THE POLYMEM.***** Apply to wound bed as instructed Secondary Dressing: ABD Pad, 8x10 1 x Per Week/30 Days Discharge Instructions: Apply over primary dressing as directed. Secondary Dressing: Woven Gauze Sponge, Non-Sterile 4x4 in 1 x Per Week/30 Days Discharge Instructions: Apply over primary dressing as directed. Secondary Dressing: Zetuvit Plus 4x8 in 1 x Per Week/30 Days Discharge Instructions: Or double ABD pad Apply over primary dressing as directed. Com pression Wrap: Urgo K2 Lite, (equivalent to a 3 layer) two layer compression system, regular 1 x Per Week/30 Days Discharge Instructions: Apply Urgo K2 Lite as directed (alternative to 3 layer compression). Com pression Wrap: unna boot FIRST LAYER **** SEE INSTRUCTIONS 1 x Per Week/30 Days Discharge Instructions: APPLY FIRST LAYER UNNA BOOT AT BASES OF TOES AND JUST BELOW THE KNEE TO HOLD COMPRESSION WRAP IN PLACE. WOUND #7: - Lower Leg Wound Laterality: Right, Posterior Cleanser: Soap and Water 1 x Per Week/30 Days Discharge Instructions: May shower and wash wound with dial antibacterial soap and water prior to dressing change. Peri-Wound Care: Sween Lotion (Moisturizing lotion) 1 x Per Week/30 Days Discharge Instructions: Apply moisturizing lotion as  directed Topical: compounding topical antibiotics 1 x Per Week/30 Days Discharge Instructions: apply Keystone directly to wound bed under the hydrofera blue. Home Health start. MIX ACCORDING TO THE PHARMACY INSTRUCTIONS. Prim Dressing: PolyMem Non-Adhesive Dressing, 4x4 in 1 x Per Week/30 Days ary Discharge Instructions: ******CUT SLITS INTO THE POLYMEM.***** Apply to wound bed as instructed Secondary Dressing: ABD Pad, 8x10 1 x Per Week/30 Days Discharge Instructions: Apply over primary dressing as directed. Secondary Dressing: Woven Gauze Sponge, Non-Sterile 4x4 in 1 x Per Week/30 Days Discharge Instructions: Apply over primary dressing as directed. Secondary Dressing: Zetuvit Plus 4x8 in 1 x Per Week/30 Days Discharge Instructions: Or double ABD pad Apply over primary dressing as directed. Com pression Wrap: Urgo K2 Lite, (equivalent to a 3 layer) two layer compression system, regular 1 x Per Week/30 Days Discharge Instructions: Apply Urgo K2 Lite as directed (alternative to 3 layer compression). Com pression Wrap: unna boot FIRST LAYER **** SEE INSTRUCTIONS 1 x Per Week/30 Days Discharge Instructions: APPLY FIRST LAYER UNNA BOOT AT BASES OF TOES AND JUST BELOW THE KNEE TO HOLD COMPRESSION WRAP IN PLACE. 1. I recommend postdebridement this looks much better way to continue with the wound care measures as before and the patient is in agreement with plan. This includes the use of the Urgo K2 light compression wrap along with the PolyMem. 2. I am also can recommend we continue with the [facility] topical antibiotics. 3. I am also can recommend that the patient should continue to elevate her legs much as possible to help with edema control. We will see patient back for reevaluation in 1 week here in the clinic. If anything worsens or changes patient will contact our office for additional recommendations. Electronic Signature(s) Signed: 02/07/2023 3:34:14 PM By: Jamie Stall RN, BSN Signed:  02/11/2023 8:43:52 AM By: Jamie Derry PA-C Previous Signature: 02/05/2023 10:46:22 AM Version By: Jamie Derry PA-C Entered By: Jamie [name] on 02/07/2023 14:10:57 Jamie [name] ([id number]) [id number] Page 13 of 13 -------------------------------------------------------------------------------- SuperBill Details Patient Name: Date of Service: Jamie [name], [name] 02/05/2023  Medical Record Number: [id number] Patient Account Number: [id number] Date of Birth/Sex: Treating RN: 1941-01-07 (82 y.o. Jamie [name], Jamie [name] Primary Care Provider: Belva [name] Other Clinician: Referring Provider: Treating Provider/Extender: Jamie [name], Jamie [name] in Treatment: 37 Diagnosis Coding ICD-10 Codes Code Description [phone] Chronic venous hypertension (idiopathic) with ulcer and inflammation of right lower extremity 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d I25.10 Atherosclerotic heart disease of native coronary artery without angina pectoris Facility Procedures : CPT4 Code: [id number] Description: 11042 - DEB SUBQ TISSUE 20 SQ CM/< ICD-10 Diagnosis Description L97.812 Non-pressure chronic ulcer of other part of right lower leg with fat layer exp Modifier: osed Quantity: 1 Physician Procedures : CPT4 Code Description Modifier [id number] 11042 - WC PHYS SUBQ TISS 20 SQ CM ICD-10 Diagnosis Description 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d Quantity: 1 Electronic Signature(s) Signed: 02/05/2023 10:48:16 AM By: Jamie Derry PA-C Entered By: Jamie [name] on 02/05/2023 10:48:16

## 2023-02-07 NOTE — Progress Notes (Signed)
Jamie [name], Jamie [name] ([id number]) [id number] Page 1 of 8 Visit Report for 02/05/2023 Arrival Information Details Patient Name: Date of Service: Jamie [name], [name]. 02/05/2023 10:30 A M Medical Record Number: [id number] Patient Account Number: [id number] Date of Birth/Sex: Treating RN: [birth year] (82 y.o. F) Primary Care Micalah Cabezas: Belva Agee Other Clinician: Referring Esabella Stockinger: Treating Tsuneo Faison/Extender: [name], [name] in Treatment: 37 Visit Information History Since Last Visit Added or deleted any medications: No Patient Arrived: Wheel Chair Any new allergies or adverse reactions: No Arrival Time: 09:52 Had a fall or experienced change in No Accompanied By: niece activities of daily living that may affect Transfer Assistance: None risk of falls: Patient Identification Verified: Yes Signs or symptoms of abuse/neglect since last visito No Secondary Verification Process Completed: Yes Hospitalized since last visit: No Patient Requires Transmission-Based Precautions: No Implantable device outside of the clinic excluding No Patient Has Alerts: No cellular tissue based products placed in the center since last visit: Pain Present Now: Yes Electronic Signature(s) Signed: 02/05/2023 11:07:28 AM By: Dayton Scrape Entered By: Dayton Scrape on 02/05/2023 06:52:56 -------------------------------------------------------------------------------- Compression Therapy Details Patient Name: Date of Service: Jamie [name]. 02/05/2023 10:30 A M Medical Record Number: [id number] Patient Account Number: [id number] Date of Birth/Sex: Treating RN: December 23, 1940 (82 y.o. [name] Primary Care Beckham Buxbaum: Belva Agee Other Clinician: Referring Demetric Dunnaway: Treating Izayah Miner/Extender: [name], [name] in Treatment: 37 Compression Therapy Performed for Wound Assessment: Wound #5 Right,Lateral Lower  Leg Performed By: Clinician Karie Schwalbe, RN Compression Type: Three Layer Post Procedure Diagnosis Same as Pre-procedure Electronic Signature(s) Signed: 02/05/2023 6:04:15 PM By: Karie Schwalbe RN Entered By: Karie Schwalbe on 02/05/2023 08:07:40 Compression Therapy Details -------------------------------------------------------------------------------- [name] ([id number]) [id number] Page 2 of 8 Patient Name: Date of Service: Jamie [name], [name]. 02/05/2023 10:30 A M Medical Record Number: [id number] Patient Account Number: [id number] Date of Birth/Sex: Treating RN: 1940/08/05 (82 y.o. [name] Primary Care Kinlie Janice: Belva Agee Other Clinician: Referring Rinaldo Macqueen: Treating Laneya Gasaway/Extender: [name], [name] in Treatment: 37 Compression Therapy Performed for Wound Assessment: Wound #7 Right,Posterior Lower Leg Performed By: Clinician Karie Schwalbe, RN Compression Type: Three Layer Post Procedure Diagnosis Same as Pre-procedure Electronic Signature(s) Signed: 02/05/2023 6:04:15 PM By: Karie Schwalbe RN Entered By: Karie Schwalbe on 02/05/2023 08:07:40 -------------------------------------------------------------------------------- Encounter Discharge Information Details Patient Name: Date of Service: Jamie [name], Jamie [name] 02/05/2023 10:30 A M Medical Record Number: [id number] Patient Account Number: [id number] Date of Birth/Sex: Treating RN: 1940/04/11 (82 y.o. [name] Primary Care Danicka Hourihan: Belva Agee Other Clinician: Referring Nechuma Boven: Treating Nahjae Hoeg/Extender: [name], Billey Gosling in Treatment: 37 Encounter Discharge Information Items Discharge Condition: Stable Ambulatory Status: Wheelchair Discharge Destination: Home Transportation: Private Auto Accompanied By: Niece Schedule Follow-up Appointment: Yes Clinical Summary of  Care: Electronic Signature(s) Signed: 02/06/2023 6:03:32 PM By: Shawn Stall RN, BSN Entered By: Shawn Stall on 02/05/2023 07:30:51 -------------------------------------------------------------------------------- Lower Extremity Assessment Details Patient Name: Date of Service: Jamie [name]. 02/05/2023 10:30 A M Medical Record Number: [id number] Patient Account Number: [id number] Date of Birth/Sex: Treating RN: 1940/07/26 (82 y.o. F) Primary Care Wylee Ogden: Belva Agee Other Clinician: Referring Quenisha Lovins: Treating Tajae Maiolo/Extender: [name], [name] in Treatment: 37 Edema Assessment Assessed: [Left: No] [Right: No] Edema: [Left: Ye] [Right: s] Calf Left: Right: Point of Measurement: From Medial Instep 35.7 cm Jamie [name], Jamie [name] ([id number]) [id number] Page 3 of 8 Ankle Left: Right: Point of Measurement: From Medial  Instep 24.3 cm Vascular Assessment Extremity colors, hair growth, and conditions: Extremity Color: [Right:Normal] Hair Growth on Extremity: [Right:No] Temperature of Extremity: [Right:Warm] Capillary Refill: [Right:< 3 seconds] Dependent Rubor: [Right:No No] Electronic Signature(s) Signed: 02/05/2023 4:40:46 PM By: Thayer Dallas Entered By: Thayer Dallas on 02/05/2023 07:14:29 -------------------------------------------------------------------------------- Multi-Disciplinary Care Plan Details Patient Name: Date of Service: Jamie [name], Jamie [name] 02/05/2023 10:30 A M Medical Record Number: [id number] Patient Account Number: [id number] Date of Birth/Sex: Treating RN: 06/14/1940 (82 y.o. Debara Pickett, Yvonne Kendall Primary Care Honesti Seaberg: Belva Agee Other Clinician: Referring Detrice Cales: Treating Chimere Klingensmith/Extender: [name] in Treatment: 37 Multidisciplinary Care Plan reviewed with physician Active Inactive Pain, Acute or Chronic Nursing Diagnoses: Pain  Management - Cyclic Acute (Dressing Change Related) Pain Management - Non-cyclic Acute (Procedural) Pain, acute or chronic: actual or potential Goals: Patient will verbalize adequate pain control and receive pain control interventions during procedures as needed Date Initiated: 09/11/2022 Target Resolution Date: 04/08/2023 Goal Status: Active Patient/caregiver will verbalize adequate pain control between visits Date Initiated: 09/11/2022 Target Resolution Date: 03/07/2023 Goal Status: Active Patient/caregiver will verbalize comfort level met Date Initiated: 09/11/2022 Target Resolution Date: 04/08/2023 Goal Status: Active Interventions: Complete pain assessment as per visit requirements Encourage patient to take pain medications as prescribed Provide education on pain management Provision of support: recognize patient pain, provide comfort and support as needed Reposition patient for comfort Treatment Activities: Administer pain control measures as ordered : 09/11/2022 Notes: Electronic Signature(s) Signed: 02/06/2023 6:03:32 PM By: Shawn Stall RN, BSN [place], Lysle Rubens ([id number]) [id number] Page 4 of 8 Entered By: Shawn Stall on 02/05/2023 07:29:57 -------------------------------------------------------------------------------- Pain Assessment Details Patient Name: Date of Service: Jamie [name], [name]. 02/05/2023 10:30 A M Medical Record Number: [id number] Patient Account Number: [id number] Date of Birth/Sex: Treating RN: 1940-08-16 (82 y.o. F) Primary Care Zafira Munos: Belva Agee Other Clinician: Referring Lemar Bakos: Treating Jveon Pound/Extender: [name], [name] in Treatment: 37 Active Problems Location of Pain Severity and Description of Pain Patient Has Paino Yes Site Locations Rate the pain. Current Pain Level: 9 Worst Pain Level: 10 Least Pain Level: 0 Tolerable Pain Level: 2 Character of Pain Describe the  Pain: Other: squeezing Pain Management and Medication Current Pain Management: Electronic Signature(s) Signed: 02/05/2023 11:07:28 AM By: Dayton Scrape Entered By: Dayton Scrape on 02/05/2023 06:53:58 -------------------------------------------------------------------------------- Patient/Caregiver Education Details Patient Name: Date of Service: Jamie [name] 10/30/2024andnbsp10:30 A M Medical Record Number: [id number] Patient Account Number: [id number] Date of Birth/Gender: Treating RN: 07-02-1940 (82 y.o. [name] Primary Care Physician: Belva Agee Other Clinician: Referring Physician: Treating Physician/Extender: [name], Billey Gosling in Treatment: [phone] Education Assessment Education Provided To: Patient Jamie [name], Jamie [name] ([id number]) [id number] Page 5 of 8 Education Topics Provided Wound/Skin Impairment: Handouts: Caring for Your Ulcer Methods: Explain/Verbal Responses: Reinforcements needed Electronic Signature(s) Signed: 02/06/2023 6:03:32 PM By: Shawn Stall RN, BSN Entered By: Shawn Stall on 02/05/2023 07:30:09 -------------------------------------------------------------------------------- Wound Assessment Details Patient Name: Date of Service: Jamie Mart W. 02/05/2023 10:30 A M Medical Record Number: [id number] Patient Account Number: [id number] Date of Birth/Sex: Treating RN: [birth year] (82 y.o. F) Primary Care Chauncey Sciulli: Belva Agee Other Clinician: Referring Eleen Litz: Treating Bernece Gall/Extender: [name], [name] in Treatment: 37 Wound Status Wound Number: 5 Primary Venous Leg Ulcer Etiology: Wound Location: Right, Lateral Lower Leg Wound Open Wounding Event: Trauma Status: Date Acquired: 04/17/2022 Comorbid Sleep Apnea, Hypertension, Peripheral Venous Disease, Weeks Of Treatment: 37 History: Osteoarthritis, Neuropathy Clustered Wound:  Yes Photos Wound Measurements Length: (cm) Width: (cm) Depth: (cm) Clustered Quantity: Area: (cm) Volume: (cm) 4.2 % Reduction in Area: 76.1% 1.9 % Reduction in Volume: 92% 0.1 Epithelialization: Medium (34-66%) 1 6.267 0.627 Wound Description Classification: Full Thickness Without Exposed Supp Wound Margin: Distinct, outline attached Exudate Amount: Medium Exudate Type: Serosanguineous Exudate Color: red, brown ort Structures Foul Odor After Cleansing: No Slough/Fibrino Yes Wound Bed Granulation Amount: Medium (34-66%) Exposed Structure Granulation Quality: Red Fascia Exposed: No Necrotic Amount: Medium (34-66%) Fat Layer (Subcutaneous Tissue) Exposed: Yes Necrotic Quality: Adherent Slough Tendon Exposed: No Muscle Exposed: No Joint Exposed: No Jamie [name], Jamie [name] ([id number]) [id number] Page 6 of 8 Bone Exposed: No Periwound Skin Texture Texture Color No Abnormalities Noted: No No Abnormalities Noted: No Callus: No Atrophie Blanche: No Crepitus: No Cyanosis: No Excoriation: No Ecchymosis: No Induration: No Erythema: No Rash: No Hemosiderin Staining: Yes Scarring: Yes Mottled: No Pallor: No Moisture Rubor: No No Abnormalities Noted: No Dry / Scaly: Yes Temperature / Pain Maceration: No Temperature: No Abnormality Tenderness on Palpation: Yes Treatment Notes Wound #5 (Lower Leg) Wound Laterality: Right, Lateral Cleanser Soap and Water Discharge Instruction: May shower and wash wound with dial antibacterial soap and water prior to dressing change. Peri-Wound Care Sween Lotion (Moisturizing lotion) Discharge Instruction: Apply moisturizing lotion as directed Topical compounding topical antibiotics Discharge Instruction: apply Keystone directly to wound bed under the hydrofera blue. Home Health start. MIX ACCORDING TO THE PHARMACY INSTRUCTIONS. Primary Dressing PolyMem Non-Adhesive Dressing, 4x4 in Discharge Instruction:  ******CUT SLITS INTO THE POLYMEM.***** Apply to wound bed as instructed Secondary Dressing ABD Pad, 8x10 Discharge Instruction: Apply over primary dressing as directed. Woven Gauze Sponge, Non-Sterile 4x4 in Discharge Instruction: Apply over primary dressing as directed. Zetuvit Plus 4x8 in Discharge Instruction: Or double ABD pad Apply over primary dressing as directed. Secured With Compression Wrap Urgo K2 Lite, (equivalent to a 3 layer) two layer compression system, regular Discharge Instruction: Apply Urgo K2 Lite as directed (alternative to 3 layer compression). unna boot FIRST LAYER **** SEE INSTRUCTIONS Discharge Instruction: APPLY FIRST LAYER UNNA BOOT AT BASES OF TOES AND JUST BELOW THE KNEE TO HOLD COMPRESSION WRAP IN PLACE. Compression Stockings Add-Ons Electronic Signature(s) Signed: 02/05/2023 11:07:28 AM By: Dayton Scrape Entered By: Dayton Scrape on 02/05/2023 07:08:32 -------------------------------------------------------------------------------- Wound Assessment Details Patient Name: Date of Service: Jamie [name], Jamie [name] 02/05/2023 10:30 A Jamie [name], Jamie [name] ([id number]) [id number] Page 7 of 8 Medical Record Number: [id number] Patient Account Number: [id number] Date of Birth/Sex: Treating RN: Sep 01, 1940 (82 y.o. F) Primary Care Emile Kyllo: Belva Agee Other Clinician: Referring Jayshun Galentine: Treating Jahni Nazar/Extender: [name], [name] in Treatment: 37 Wound Status Wound Number: 7 Primary Venous Leg Ulcer Etiology: Wound Location: Right, Posterior Lower Leg Wound Open Wounding Event: Gradually Appeared Status: Date Acquired: 01/01/2023 Comorbid Sleep Apnea, Hypertension, Peripheral Venous Disease, Weeks Of Treatment: 5 History: Osteoarthritis, Neuropathy Clustered Wound: No Photos Wound Measurements Length: (cm) 4 Width: (cm) 2.8 Depth: (cm) 0.1 Area: (cm) 8.796 Volume: (cm) 0.88 % Reduction in  Area: 25.1% % Reduction in Volume: 62.6% Epithelialization: Small (1-33%) Wound Description Classification: Full Thickness Without Exposed Support Structures Wound Margin: Distinct, outline attached Exudate Amount: Medium Exudate Type: Serosanguineous Exudate Color: red, brown Foul Odor After Cleansing: No Slough/Fibrino Yes Wound Bed Granulation Amount: Small (1-33%) Exposed Structure Granulation Quality: Red, Pink Fascia Exposed: No Necrotic Amount: Large (67-100%) Fat Layer (Subcutaneous Tissue) Exposed: Yes Necrotic Quality: Adherent Slough Tendon Exposed: No Muscle Exposed: No Joint Exposed: No Bone  Exposed: No Periwound Skin Texture Texture Color No Abnormalities Noted: No No Abnormalities Noted: No Callus: No Atrophie Blanche: No Crepitus: No Cyanosis: No Excoriation: No Ecchymosis: No Induration: No Erythema: No Rash: No Hemosiderin Staining: Yes Scarring: Yes Mottled: No Pallor: No Moisture Rubor: No No Abnormalities Noted: No Dry / Scaly: No Temperature / Pain Maceration: No Temperature: No Abnormality Tenderness on Palpation: Yes Treatment Notes Wound #7 (Lower Leg) Wound Laterality: Right, Posterior Cleanser Soap and Water Discharge Instruction: May shower and wash wound with dial antibacterial soap and water prior to dressing change. Jamie [name], Jamie [name] ([id number]) [id number] Page 8 of 8 Peri-Wound Care Sween Lotion (Moisturizing lotion) Discharge Instruction: Apply moisturizing lotion as directed Topical compounding topical antibiotics Discharge Instruction: apply Jodie Echevaria directly to wound bed under the hydrofera blue. Home Health start. MIX ACCORDING TO THE PHARMACY INSTRUCTIONS. Primary Dressing PolyMem Non-Adhesive Dressing, 4x4 in Discharge Instruction: ******CUT SLITS INTO THE POLYMEM.***** Apply to wound bed as instructed Secondary Dressing ABD Pad, 8x10 Discharge Instruction: Apply over primary dressing  as directed. Woven Gauze Sponge, Non-Sterile 4x4 in Discharge Instruction: Apply over primary dressing as directed. Zetuvit Plus 4x8 in Discharge Instruction: Or double ABD pad Apply over primary dressing as directed. Secured With Compression Wrap Urgo K2 Lite, (equivalent to a 3 layer) two layer compression system, regular Discharge Instruction: Apply Urgo K2 Lite as directed (alternative to 3 layer compression). unna boot FIRST LAYER **** SEE INSTRUCTIONS Discharge Instruction: APPLY FIRST LAYER UNNA BOOT AT BASES OF TOES AND JUST BELOW THE KNEE TO HOLD COMPRESSION WRAP IN PLACE. Compression Stockings Add-Ons Electronic Signature(s) Signed: 02/05/2023 11:07:28 AM By: Dayton Scrape Entered By: Dayton Scrape on 02/05/2023 07:09:22 -------------------------------------------------------------------------------- Vitals Details Patient Name: Date of Service: Jamie [name], Jamie [name] 02/05/2023 10:30 A M Medical Record Number: [id number] Patient Account Number: [id number] Date of Birth/Sex: Treating RN: [birth year] (82 y.o. F) Primary Care Verbena Boeding: Belva Agee Other Clinician: Referring Margaretta Chittum: Treating Frimy Uffelman/Extender: [name], [name] in Treatment: 37 Vital Signs Time Taken: 09:52 Temperature (F): 98.9 Height (in): 62 Pulse (bpm): 47 Weight (lbs): 171 Respiratory Rate (breaths/min): 18 Body Mass Index (BMI): 31.3 Blood Pressure (mmHg): 159/87 Reference Range: 80 - 120 mg / dl Electronic Signature(s) Signed: 02/05/2023 11:07:28 AM By: Dayton Scrape Entered By: Dayton Scrape on 02/05/2023 06:53:21

## 2023-02-12 ENCOUNTER — Encounter (HOSPITAL_BASED_OUTPATIENT_CLINIC_OR_DEPARTMENT_OTHER): Payer: Medicare HMO | Attending: Physician Assistant | Admitting: Internal Medicine

## 2023-02-12 DIAGNOSIS — I872 Venous insufficiency (chronic) (peripheral): Secondary | ICD-10-CM | POA: Diagnosis not present

## 2023-02-12 DIAGNOSIS — I2510 Atherosclerotic heart disease of native coronary artery without angina pectoris: Secondary | ICD-10-CM | POA: Diagnosis not present

## 2023-02-12 DIAGNOSIS — I87331 Chronic venous hypertension (idiopathic) with ulcer and inflammation of right lower extremity: Secondary | ICD-10-CM | POA: Diagnosis present

## 2023-02-12 DIAGNOSIS — I10 Essential (primary) hypertension: Secondary | ICD-10-CM | POA: Diagnosis not present

## 2023-02-12 DIAGNOSIS — M1990 Unspecified osteoarthritis, unspecified site: Secondary | ICD-10-CM | POA: Insufficient documentation

## 2023-02-12 DIAGNOSIS — G4730 Sleep apnea, unspecified: Secondary | ICD-10-CM | POA: Insufficient documentation

## 2023-02-12 DIAGNOSIS — Z8614 Personal history of Methicillin resistant Staphylococcus aureus infection: Secondary | ICD-10-CM | POA: Insufficient documentation

## 2023-02-12 DIAGNOSIS — L97812 Non-pressure chronic ulcer of other part of right lower leg with fat layer exposed: Secondary | ICD-10-CM | POA: Diagnosis not present

## 2023-02-12 DIAGNOSIS — G629 Polyneuropathy, unspecified: Secondary | ICD-10-CM | POA: Diagnosis not present

## 2023-02-13 NOTE — Progress Notes (Addendum)
Jamie, JESPERSON ([id number]) [id number] Page 1 of 11 Visit Report for 02/12/2023 HPI Details Patient Name: Date of Service: Jamie [name], [name]. 02/12/2023 10:15 A M Medical Record Number: [id number] Patient Account Number: [id number] Date of Birth/Sex: Treating RN: December 31, 1940 (82 y.o. F) Primary Care Provider: Belva Agee Other Clinician: Referring Provider: Treating Provider/Extender: Vivia Budge in Treatment: 66 History of Present Illness HPI Description: ADMISSION 06/30/2020 Jamie [name] is an 82 year old woman who lives in [place]. She is here with her niece for review of wounds on the left medial lower leg and ankle. These have apparently been present for over a year and she followed with Dr. Olegario Messier at the [facility] in [place] for quite a period of time although it looks as though there was a initial consult wound from Dr. Marcha Solders on February 18 presumably there was therefore hiatus. At that point the wounds were described as being there for 3 months. She also tells me she was at the wound care center in [place] for a period of time with this. 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 She had venous studies that were negative for DVT ABIs on the right were 1.01 on the left 1.06. She has . had previous applications of puraply, compression which she does not tolerate very well. She has had several rounds of oral antibiotic therapy. She complains of unrelenting pain and she is seeing Dr. Reece Agar V of pain management apparently was on oxycodone but that did not help. She also had a skin biopsy done by Dr. Olegario Messier although we do not have that result. She does not appear to have an arterial issue. I am not completely clear what she has been putting on the wounds lately. 3/31; this patient has a particularly nasty set of wounds on the left medial ankle in the middle of  what looks to be hemosiderin deposition secondary to chronic venous insufficiency. She has a lot of pain followed by pain management. Dr. Marcha Solders apparently did a biopsy of something on the left leg last fall what I would like to get this result. She has a history of MRSA treatment. I do not believe she had reflux studies but she did have DVT rule out studies. Previous ABIs have not suggested arterial insufficiency 4/7; difficult wounds on the left medial ankle probably chronic venous insufficiency. With considerable effort on behalf of our case manager we were able to finally to speak to somebody at the hospital in [place] who indicated that no biopsy of this area have been done even though the patient describes this in some detail and is even able to point out where she thinks the biopsy was done. We have been using Sorbact. The PCR culture I did showed polymicrobial identification with Pseudomonas, staph aureus, Peptostreptococcus. All of this and low titers. Resistance genes identified were MRSA, staph virulence gene and tetracycline. We are going to send this to [facility] for a topical antibiotic which is something that we have had good success with recently in large venous ulcers with a lot of purulent drainage. There would not be an easy oral alternative here possibly line escalated and ciprofloxacin if we need to use systemic antibiotic 4/15; difficult area on the left medial ankle. Most likely chronic venous insufficiency. I think she will probably need venous reflux study I think she had DVT rule outs but not venous reflux studies. We have not yet obtained the topical antibiotics. She has  home health changing the dressing we have been using Sorbact for adherent fibrinous debris on the surface. Very difficult to remove 4/22; patient presents for 1 week follow-up. She has been using sore back under compression wraps and these are changed 3 times a week with home health. She also had Keystone  antibiotics sent to her house and brought them in today. She has no complaints or issues today. 5/2; patient is here for follow-up. She has been using Sorbact under compression. Very painful wound. She has been using Keystone antibiotics. Not much improvement although the more medial part of the wound has cleaned up nicely and the larger part of the wound about 50% slough covered. Part of the issue here is that she had stays at both [facility] wound care center, [facility] wound care center and now [place]. Not sure if she has had venous reflux studies. As far as we are able to tell she did not have a biopsy. My notes state that she did not have venous reflux studies just DVT rule outs. 5/16; patient goes for venous reflux studies this afternoon. She says that wound was biopsied which sounds like punch biopsies by Dr. Lynden Ang we do not have these results. We are using Sorbact. Very difficult wound to debride 5/23; patient presents for 1 week follow-up. She reports tolerating the wraps well with sorbact underneath. She had ABIs and venous reflux studies done. She has no issues or complaints today. She denies acute signs of infection. 6/6; I have reviewed the patient's vascular studies. Wounds are on the left medial and posterior calf. She had significant reflux in the greater saphenous vein in the in the mid thigh, distal thigh knee and the small saphenous vein in the popliteal fossa. The vein diameters do not look too impressive though. She is going to see the vascular surgeon on Wednesday. She also had venous reflux in the right common femoral vein. She did not have any evidence of a DVT or SVT I am wondering whether there is an ablation procedure that would benefit her in the greater saphenous vein on the left. . She tells [place] that home health put the dressing on too tight and she took off 1 layer. The swelling in her left leg is a little worse as a result of this. Not much change in the wound measurements so  the surface of the wound looks better Her arterial studies showed an ABI on the right of 1.14 with a triphasic waveform and a great toe pressure of 0.89. On the left her ABIs were noncompressible at 1.34 but with triphasic waveforms and a TBI of 0.97. Her greater toe pressure was 122. There was no evidence of significant bilateral arterial disease 6/20 patient went to see Dr. Durwin Nora. He did not think she had significant arterial disease. In terms of her venous duplex on the right side there was no evidence of a DVT or SVT there was deep venous reflux involving the common femoral vein no superficial vein reflux on the left side there was no DVT or SVT there was no deep vein graft reflux there was some reflux in the greater saphenous vein from the mid thigh to the knee but the vein here was not dilated. He thought these were venous wounds he prescribed a compression pump but I am not sure who we ordered it from The patient has been approved for Apligraf. Still using silver collagen this week 7/5; Apligraf #1 7/19 Apligraf #2. Decent improvement in the condition of  the wound bed. Epithelialization distal 8/2 Apligraf #3. No issues or complaints. Denies signs of infection. 8/17 Apligraf #4. No issues or concerns. Some complaints of pruritus and the rash. Our intake nurse brought up the fact that she had previously indicated possible cotton layer sensitivity we will therefore use kerlix in the bottom layer of the compression 8/30; the patient comes in with the area on her left medial leg just about healed. There is a superficial area more towards the tibia and a smaller open area WILLIEMAE, SHULL ([id number]) [id number] Page 2 of 11 distally everything else is epithelialized. I do not think she requires another Apligraf 9/13; left medial leg is healed. 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 Her edema control is  good Readmisstion: 05-22-2022 upon evaluation today patient presents for initial inspection here in our clinic concerning issues that she has been having with a wound of the right lateral lower extremity. This is an area of a previous skin graft she tells me. With that being said she unfortunately had a scrape on this that occurred around 10 January. Since that time she has noted that this has just continued to get bigger and bigger in her niece who is present with her today actually states that 2 weeks ago when she saw that it was significantly smaller than what she sees currently. Obviously this is of utmost concern as we do not want this to continue to get larger when arrested and get moving in the right direction. Fortunately there does not appear to be any signs of systemic infection though locally I think we probably do have some infection present. I would obtain a culture to see what we have going on here and then we will subsequently see where things go going forward. Fortunately I think that she is in the right place at this point being at the wound center we can definitely do something to try to get this moving in the right direction. Patient does have a history of chronic venous insufficiency as well as coronary artery disease. In the past she has done well with compression wraps on the start with a 3 layer wrap I think she is probably can end up going to a 4-layer at some point but we will see how things do over the next week. She has been using wound cleanser along with she tells me a zinc and topical but again I am not sure exactly what that was. 05-29-2022 upon evaluation today patient appears to be doing well currently in regard to her wound. This actually is showing signs of improvement I am happy in that regard unfortunately her left leg has an area on the shin that has opened. This is the region where one of the areas at least we have previously taken care of. Nonetheless this is  small hoping we get it under control before things worsen significantly. 06-05-2022 upon evaluation today patient appears to be doing well currently in regard to her wounds. The actually seem to be fairly clean she is having still quite a bit of problem with pain at this point she would like to not have debridement today for all possible. With that being said I do believe that we are making some good progress I think the [facility] is doing a decent job here. 3/6; patient has had 3/6; patient with known severe chronic venous insufficiency. She apparently had a traumatic wound at home and has a  reasonably substantial wound on the right lateral lower leg and a smaller one on the left anterior lower leg as well. We have been using Hydrofera Blue and Unna boots 3/13; patient presents for follow-up. We have been using Hydrofera Blue under Coflex to the legs bilaterally. She has no issues or complaints today. 06-26-2022 upon evaluation today patient appears to be doing well currently in regard to her wound. This is measuring a little bit larger however. Fortunately I do not see any signs of infection this is on the right side. Fortunately the left side is actually completely healed which is great news. 07-03-2022 patient's wound unfortunately is continuing to show signs of being worse. Her compression which was the Tubigrip that I thought should be able to pull up actually slipped down and she was not able to pull that back up. With that being said that means that unfortunately her wound has continued to deteriorate since I last saw her. This is definitely not the direction that we are looking for. I discussed with her that I do believe we need to go ahead and see about getting her started on antibiotics and I subsequently would also like to go ahead and see about getting things moving forward with regard to a wound culture and making a change up her dressings as well but this can be changed more frequently  than just once a week. 07-10-2022 upon evaluation today patient actually appears to be doing much better. I do believe that the antibiotics have been beneficial for her. Fortunately I do not see any signs of active infection locally nor systemically which is great news. No fevers, chills, nausea, vomiting, or diarrhea. 07-17-2022 upon evaluation today patient appears to be doing well currently in regard to her wound which is actually showing signs of improvement this is slow but nonetheless prevalent that we are seeing good improvement here. Fortunately I do not see any signs of active infection locally nor systemically which is great news. 07-24-2022 upon evaluation today patient appears to be doing well currently in regard to her wound although the wrap that we had on has been slipping down and she really does not have anybody to help her with this at home health is not going to be coming out we could not get anybody that would actually be able to do the dressing changes. Fortunately I do not see any signs of active infection locally nor systemically which is great news. 08-07-22 poorly in regard to her leg compared to what it was previous. Fortunately there does not appear to be any signs of active infection locally nor systemically which is great news. No fevers, chills, nausea, vomiting, or diarrhea. With that being said it does appear that the patient may have some cellulitis in the leg which is not good. 08-14-2022 upon evaluation today patient appears to be doing somewhat better in regard to her wounds she still is having quite a bit of pain we are still not where we want to be as far as healing is concerned completely. Fortunately I do not see any evidence of active infection locally nor systemically which is great news and I am very pleased in that regard. 08-21-23 upon evaluation today patient appears to be doing poorly currently in regard to her wound. She has been tolerating the dressing changes  without complication. Fortunately there does not appear to be any signs of active infection locally nor systemically at this time. 08-28-2022 upon evaluation today patient appears to be doing  poorly in regard to her wound this was not wrapped appropriately home health did not go up high enough on the wrap. This has caused some issues and I discussed that with the patient today. I do believe that she is going to require aggressive wrapping and treatment which she is getting the [facility] topical antibiotics today they can start using that at the next wrap on Friday. In the meantime they need to make sure that the rapid and appropriately we wrote very specific orders today. 09-04-2022 upon evaluation today patient appears to be doing well currently in regard to her wound which I think is making progress is still hurting her quite significantly. Fortunately I do not see any signs of active infection locally or systemically which is great news. No fevers, chills, nausea, vomiting, or diarrhea. 09-11-2022 upon evaluation today patient's wound actually is showing signs of being a little bit smaller and looking a little bit better she still has a lot going on here however. Fortunately I do not see any evidence of active infection Worsening locally nor systemically which is great news. With that being said worsening still continue to use the topical Keystone antibiotics. 09-18-2022 upon evaluation today patient actually showing some signs of improvement. I am actually very pleased with where we stand compared to where we have been. I think that she is making good headway here. She is still having quite a bit of pain but it seems to be lessening compared to previous. 09-25-2022 upon evaluation patient's wound actually showing signs slowly but surely of improvement. Fortunately I do not see any signs of active infection at this time systemically and locally I feel like this is dramatically improved. She is doing well  with the [facility] topical antibiotics. 10-02-2022 upon evaluation today patient appears to be doing better in regard to her wound overall. I am very pleased with where things stand from a visual standpoint I do not see any signs of active infection and in general I think that we are moving in the right direction here. 10-09-2022 upon evaluation today patient appears to be doing well currently in regard to her wound. She has been tolerating the dressing changes without complication. Fortunately I do not see any signs of active infection at this time which is great news. 10-16-2022 upon evaluation today patient continues to have quite a bit of pain in regard to her right leg. We have been attempting to debride little by little as we could but again was pretty limited by the fact that she is having significant discomfort. We do not actually have any signs of active infection going on at this point that the [facility] topical antibiotics have been helping quite readily. I been attempting to do as much debridement as I can but if she were to have pain medication this would actually help and in the past when she did it was much more tolerable for her as far as taking the edge off. Right now she has been without as she is in transition from her previous pain management physician to someone new to manage this. SHINA, JUNES ([id number]) [id number] Page 3 of 11 10-23-2022 upon evaluation patient appears to be doing excellent in regard to her wound. She has been tolerating the dressing changes without complication. Fortunately I do not see any evidence of active infection locally nor systemically which is great news. No fevers, chills, nausea, vomiting, or diarrhea. 7/24; patient with a wound secondary to chronic venous insufficiency on the right lateral  lower leg. We have been using Keystone antibiotic Hydrofera Blue under kerlix Coban. She complains of a lot of pain after last  week's debridement otherwise things appear to be improved per discussion with our intake nurse. 11-06-2022 upon evaluation today patient appears to be doing excellent at this point in regard to her wound. She has been tolerating the dressing changes without complication and to be honest she is making excellent progress towards complete closure. I am actually very pleased with where we stand I think that she is doing excellent as far as the overall appearance of the wound is concerned as well. She is going require some debridement however. 11-20-2022 upon evaluation today patient appears to be doing well currently in regard to her wound. She has been tolerating the dressing changes without complication. Fortunately there does not appear to be any signs of active infection locally or systemically which is great news and in general I do believe that we are moving in the right direction here. No fevers, chills, nausea, vomiting, or diarrhea. 11-27-2022 upon evaluation today patient appears to be doing a little better in regards to the overall appearance of her wound but unfortunately she is having increased pain. The collagen seems to be getting very dry and stuck to the wound bed which is causing her some issues here. Fortunately I do not see any signs of active infection locally nor systemically at this time. Fortunately I think that her wound is better but unfortunately her pain has not. For that reason I am going to avoid any debridement today since the patient is very upset about the pain and how bad this is hurting at this point. I think we can have to try something a little bit different I am thinking PolyMem may be a good option. 8/28; this patient has difficult venous wounds on the right lateral lower leg and ankle in the setting of chronic venous insufficiency. We have been using polymen under compression with kerlix Coban. 12-11-2022 upon evaluation today patient appears to be doing well currently in  regard to her wound. She has been tolerating the dressing changes without complication and actually appears to be doing much better this week compared to when I saw her 2 weeks ago. The wound is measuring significantly smaller. We are using PolyMem along with Kerlix and Coban. 12-18-2022 upon evaluation today patient appears to be doing well currently in regard to her wound. This is actually showing signs of improvement is measuring a little bit smaller and looking better. Fortunately I do not see any evidence of worsening overall and I believe that we will making good headway with the PolyMem and the [facility] topical antibiotics. 12-25-2022 upon evaluation today patient appears to be doing well currently in regard to her wound. She has been tolerating the dressing changes without complication. Fortunately I do not see any evidence of infection locally or systemically which is great news and in general I do believe that we will make an excellent headway towards complete closure also excellent news. 01-01-2023 upon evaluation patient's wound actually showing signs of improvement the wrap was actually put on properly this week and this is good news. Overall I am extremely happy with where things stand and how this appears today I do not see any signs of worsening overall and I believe that the patient is making excellent headway towards complete closure which is great news. 01/08/2023 upon evaluation today patient appears to be doing well currently in regard to her leg. She is  actually draining much less unfortunately home health is just not doing very well getting the supplies necessary in order to continue to take care of the patient. They are now telling me they cannot get the PolyMem which is something that has been doing really well for her. That really does not make sense to me you came to get PolyMem for a hospice patient. Nonetheless either way I think we may need to just discontinue treatment with  home health and just manage the patient here at the clinic. 01-15-2023 upon evaluation today patient appears to be doing well currently in regard to her wound. She has been tolerating the dressing changes without complication. Fortunately I do not see any signs of infection I think she is doing quite well and very pleased with where we stand today. No fevers, chills, nausea, vomiting, or diarrhea. 01-22-2023 upon evaluation today patient appears to be doing well currently in regard to her wound. She has been tolerating the dressing changes without complication and both wounds are measuring smaller today and look to be doing excellent. I am actually very pleased with what ever standing here and I think that she is making really good headway towards closure which is great news. 01-29-2023 upon evaluation today patient actually tells me she has been having some increased pain. Subsequently it took a little bit of drilling down but in the end I realized that we actually ended up putting a full Unna boot on her last week as opposed to just using a good anchor at the top and bottom. I think this is what wound up with her having increased discomfort because she tells me as she was doing well when she came in last time and by the time she left she tells me this was already getting significantly worse. And she hurt most of the week. With that being said I do believe that the patient is doing quite well at this point with regard to her wounds and the wrap did okay it was not a problem other than the increased pain I think we may benefit from a Urgo K2 light compression wrap as this will be much more comfortable I think compared to what we have been doing with just the Kerlix Coban anyway. 02-05-2023 upon evaluation today patient appears to be doing well currently in regard to her wounds. She has been tolerating the dressing changes without complication. Fortunately I do not see any signs of active infection  looking systemically which is great news. No fevers, chills, nausea, vomiting, or diarrhea. 11/6; patient I remember from a previous stay in this clinic With a left lower extremity wound.. She has a right lower extremity venous wound. She has been using Keystone, polymen under [employer]. Her wound now is to remaining open areas. There is no evidence of infection although she is tender in the area. I wonder whether there is stasis dermatitis here. She reminds me that she had some form of skin graft on this area during his stay at the [facility] wound care center presumably in around 2022. Electronic Signature(s) Signed: 02/12/2023 4:54:50 PM By: [name] MD Entered By: [name] on 02/12/2023 10:23:58 -------------------------------------------------------------------------------- Physical Exam Details Patient Name: Date of Service: Jamie [name], [name] 02/12/2023 10:15 A M Medical Record Number: [id number] Patient Account Number: [id number] Date of Birth/Sex: Treating RN: 04-03-1941 (82 y.o. F) Primary Care Provider: Belva Agee Other Clinician: Referring Provider: Treating Provider/Extender: Alamea, Goodemote, Lysle Rubens ([id number]) [id number] Page  4 of 11 Weeks in Treatment: 38 Notes Wound exam; the patient has 2 open areas and what was originally a large open wound. These wounds are superficial. The wound surface is questionable but I elected not to do debridements as the dimensions are measuring quite a bit smaller. I see no evidence of surrounding infection her edema control is good. She has what appears to be stasis dermatitis around this area. Electronic Signature(s) Signed: 02/12/2023 4:54:50 PM By: [name] MD Entered By: [name] on 02/12/2023 10:24:48 -------------------------------------------------------------------------------- Physician Orders Details Patient Name: Date of Service: Jamie [name], [name] 02/12/2023 10:15 A M Medical Record Number: [id number] Patient Account Number: [id number] Date of Birth/Sex: Treating RN: April 12, 1940 (82 y.o. 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 - Dr. Leanord Hawking Wednesday (already scheduled) 02/19/2023 1015 Return A ppointment in 2 weeks. - Dr. Leanord Hawking Wednesday 02/26/2023 1030 Return A Nurse Visit: - 03/05/2023 (already scheduled) 1030 ****change Wound Care encounter to nurse visit***** Anesthetic Wound #5 Right,Lateral Lower Leg (In clinic) Topical Lidocaine 4% applied to wound bed Bathing/ Shower/ Hygiene May shower and wash wound with soap and water. - with dressing changes. Edema Control - Orders / Instructions Elevate legs to the level of the heart or above for 30 minutes daily and/or when sitting for 3-4 times a day throughout the day. Avoid standing for long periods of time. Exercise regularly Wound Treatment Wound #5 - Lower Leg Wound Laterality: Right, Lateral Cleanser: Soap and Water 1 x Per Week/30 Days Discharge Instructions: May shower and wash wound with dial antibacterial soap and water prior to dressing change. Peri-Wound Care: Sween Lotion (Moisturizing lotion) 1 x Per Week/30 Days Discharge Instructions: Apply moisturizing lotion as directed Topical: compounding topical  antibiotics 1 x Per Week/30 Days Discharge Instructions: apply Keystone directly to wound bed under the hydrofera blue. Home Health start. MIX ACCORDING TO THE PHARMACY INSTRUCTIONS. Prim Dressing: PolyMem Non-Adhesive Dressing, 4x4 in 1 x Per Week/30 Days ary Discharge Instructions: ******CUT SLITS INTO THE POLYMEM.***** Apply to wound bed as instructed Secondary Dressing: ABD Pad, 8x10 1 x Per Week/30 Days Discharge Instructions: Apply over primary dressing as directed. JERRYE, GOLDEN ([id number]) [id number] Page 5 of 11 Secondary Dressing: Woven Gauze Sponge, Non-Sterile 4x4 in 1 x Per Week/30 Days Discharge Instructions: Apply over primary dressing as directed. Secondary Dressing: Zetuvit Plus 4x8 in 1 x Per Week/30 Days Discharge Instructions: Or double ABD pad Apply over primary dressing as directed. Compression Wrap: Urgo K2 Lite, (equivalent to a 3 layer) two layer compression system, regular 1 x Per Week/30 Days Discharge Instructions: Apply Urgo K2 Lite as directed (alternative to 3 layer compression). Compression Wrap: unna boot FIRST LAYER **** SEE INSTRUCTIONS 1 x Per Week/30 Days Discharge Instructions: APPLY FIRST LAYER UNNA BOOT AT BASES OF TOES AND JUST BELOW THE KNEE TO HOLD COMPRESSION WRAP IN PLACE. Wound #7 - Lower Leg Wound Laterality: Right, Posterior Cleanser: Soap and Water 1 x Per Week/30 Days Discharge Instructions: May shower and wash  wound with dial antibacterial soap and water prior to dressing change. Peri-Wound Care: Sween Lotion (Moisturizing lotion) 1 x Per Week/30 Days Discharge Instructions: Apply moisturizing lotion as directed Topical: compounding topical antibiotics 1 x Per Week/30 Days Discharge Instructions: apply Keystone directly to wound bed under the hydrofera blue. Home Health start. MIX ACCORDING TO THE PHARMACY INSTRUCTIONS. Prim Dressing: PolyMem Non-Adhesive Dressing, 4x4 in 1 x Per Week/30  Days ary Discharge Instructions: ******CUT SLITS INTO THE POLYMEM.***** Apply to wound bed as instructed Secondary Dressing: ABD Pad, 8x10 1 x Per Week/30 Days Discharge Instructions: Apply over primary dressing as directed. Secondary Dressing: Woven Gauze Sponge, Non-Sterile 4x4 in 1 x Per Week/30 Days Discharge Instructions: Apply over primary dressing as directed. Secondary Dressing: Zetuvit Plus 4x8 in 1 x Per Week/30 Days Discharge Instructions: Or double ABD pad Apply over primary dressing as directed. Compression Wrap: Urgo K2 Lite, (equivalent to a 3 layer) two layer compression system, regular 1 x Per Week/30 Days Discharge Instructions: Apply Urgo K2 Lite as directed (alternative to 3 layer compression). Compression Wrap: unna boot FIRST LAYER **** SEE INSTRUCTIONS 1 x Per Week/30 Days Discharge Instructions: APPLY FIRST LAYER UNNA BOOT AT BASES OF TOES AND JUST BELOW THE KNEE TO HOLD COMPRESSION WRAP IN PLACE. Electronic Signature(s) Signed: 02/12/2023 4:54:50 PM By: [name] MD Signed: 02/12/2023 6:03:26 PM By: Shawn Stall RN, BSN Entered By: Shawn Stall on 02/12/2023 10:15:00 -------------------------------------------------------------------------------- Problem List Details Patient Name: Date of Service: Jamie [name], [name] 02/12/2023 10:15 A M Medical Record Number: [id number] Patient Account Number: [id number] Date of Birth/Sex: Treating RN: [birth year] (82 y.o. [name] Primary Care Provider: Belva Agee Other Clinician: Referring Provider: Treating Provider/Extender: Vivia Budge in Treatment: 38 Active Problems ICD-10 Encounter Code Description Active Date MDM Diagnosis I87.331 Chronic venous hypertension (idiopathic) with ulcer and inflammation of right 05/22/2022 No Yes lower extremity AREATHER, HECKENDORN ([id number]) [phone].pdf Page 6 of 11 [phone] Non-pressure chronic ulcer of  other part of right lower leg with fat layer 05/22/2022 No Yes exposed I25.10 Atherosclerotic heart disease of native coronary artery without angina pectoris 05/22/2022 No Yes Inactive Problems Resolved Problems Electronic Signature(s) Signed: 02/12/2023 4:54:50 PM By: [name] MD Entered By: [name] on 02/12/2023 10:21:08 -------------------------------------------------------------------------------- Progress Note Details Patient Name: Date of Service: Jamie [name], [name] 02/12/2023 10:15 A M Medical Record Number: [id number] Patient Account Number: [id number] Date of Birth/Sex: Treating RN: 05/13/1940 (82 y.o. F) Primary Care Provider: Belva Agee Other Clinician: Referring Provider: Treating Provider/Extender: Vivia Budge in Treatment: 38 Subjective History of Present Illness (HPI) ADMISSION 06/30/2020 Mrs. Limmer is an 82 year old woman who lives in [place]. She is here with her niece for review of wounds on the left medial lower leg and ankle. These have apparently been present for over a year and she followed with Dr. Olegario Messier at the [facility] in [place] for quite a period of time although it looks as though there was a initial consult wound from Dr. Marcha Solders on February 18 presumably there was therefore hiatus. At that point the wounds were described as being there for 3 months. She also tells me she was at the wound care center in [place] for a period of time with this. 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 She had venous studies that were negative for DVT ABIs on the right were 1.01 on the left 1.06. She has . had previous applications  of puraply, compression which she does not tolerate very well. She has had several rounds of oral antibiotic therapy. She complains of unrelenting pain and she is seeing Dr. Reece Agar V of pain management apparently was on oxycodone but that  did not help. She also had a skin biopsy done by Dr. Olegario Messier although we do not have that result. She does not appear to have an arterial issue. I am not completely clear what she has been putting on the wounds lately. 3/31; this patient has a particularly nasty set of wounds on the left medial ankle in the middle of what looks to be hemosiderin deposition secondary to chronic venous insufficiency. She has a lot of pain followed by pain management. Dr. Marcha Solders apparently did a biopsy of something on the left leg last fall what I would like to get this result. She has a history of MRSA treatment. I do not believe she had reflux studies but she did have DVT rule out studies. Previous ABIs have not suggested arterial insufficiency 4/7; difficult wounds on the left medial ankle probably chronic venous insufficiency. With considerable effort on behalf of our case manager we were able to finally to speak to somebody at the hospital in [place] who indicated that no biopsy of this area have been done even though the patient describes this in some detail and is even able to point out where she thinks the biopsy was done. We have been using Sorbact. The PCR culture I did showed polymicrobial identification with Pseudomonas, staph aureus, Peptostreptococcus. All of this and low titers. Resistance genes identified were MRSA, staph virulence gene and tetracycline. We are going to send this to [facility] for a topical antibiotic which is something that we have had good success with recently in large venous ulcers with a lot of purulent drainage. There would not be an easy oral alternative here possibly line escalated and ciprofloxacin if we need to use systemic antibiotic 4/15; difficult area on the left medial ankle. Most likely chronic venous insufficiency. I think she will probably need venous reflux study I think she had DVT rule outs but not venous reflux studies. We have not yet obtained the topical antibiotics. She  has home health changing the dressing we have been using Sorbact for adherent fibrinous debris on the surface. Very difficult to remove 4/22; patient presents for 1 week follow-up. She has been using sore back under compression wraps and these are changed 3 times a week with home health. She also had Keystone antibiotics sent to her house and brought them in today. She has no complaints or issues today. 5/2; patient is here for follow-up. She has been using Sorbact under compression. Very painful wound. She has been using Keystone antibiotics. Not much improvement although the more medial part of the wound has cleaned up nicely and the larger part of the wound about 50% slough covered. Part of the issue here is that she had stays at both [facility] wound care center, [facility] wound care center and now [place]. Not sure if she has had venous reflux studies. As far as we are able to tell she did not have a biopsy. My notes state that she did not have venous reflux studies just DVT rule outs. 5/16; patient goes for venous reflux studies this afternoon. She says that wound was biopsied which sounds like punch biopsies by Dr. Lynden Ang we do not have these results. We are using Sorbact. Very difficult wound to debride 5/23; patient presents for 1 week  follow-up. She reports tolerating the wraps well with sorbact underneath. She had ABIs and venous reflux studies done. She has no issues or complaints today. She denies acute signs of infection. LOUISA, MCCLENNEY ([id number]) [id number] Page 7 of 11 6/6; I have reviewed the patient's vascular studies. Wounds are on the left medial and posterior calf. She had significant reflux in the greater saphenous vein in the in the mid thigh, distal thigh knee and the small saphenous vein in the popliteal fossa. The vein diameters do not look too impressive though. She is going to see the vascular surgeon on Wednesday. She also had venous reflux in  the right common femoral vein. She did not have any evidence of a DVT or SVT I am wondering whether there is an ablation procedure that would benefit her in the greater saphenous vein on the left. . She tells [place] that home health put the dressing on too tight and she took off 1 layer. The swelling in her left leg is a little worse as a result of this. Not much change in the wound measurements so the surface of the wound looks better Her arterial studies showed an ABI on the right of 1.14 with a triphasic waveform and a great toe pressure of 0.89. On the left her ABIs were noncompressible at 1.34 but with triphasic waveforms and a TBI of 0.97. Her greater toe pressure was 122. There was no evidence of significant bilateral arterial disease 6/20 patient went to see Dr. Durwin Nora. He did not think she had significant arterial disease. In terms of her venous duplex on the right side there was no evidence of a DVT or SVT there was deep venous reflux involving the common femoral vein no superficial vein reflux on the left side there was no DVT or SVT there was no deep vein graft reflux there was some reflux in the greater saphenous vein from the mid thigh to the knee but the vein here was not dilated. He thought these were venous wounds he prescribed a compression pump but I am not sure who we ordered it from The patient has been approved for Apligraf. Still using silver collagen this week 7/5; Apligraf #1 7/19 Apligraf #2. Decent improvement in the condition of the wound bed. Epithelialization distal 8/2 Apligraf #3. No issues or complaints. Denies signs of infection. 8/17 Apligraf #4. No issues or concerns. Some complaints of pruritus and the rash. Our intake nurse brought up the fact that she had previously indicated possible cotton layer sensitivity we will therefore use kerlix in the bottom layer of the compression 8/30; the patient comes in with the area on her left medial leg just about healed. There is  a superficial area more towards the tibia and a smaller open area distally everything else is epithelialized. I do not think she requires another Apligraf 9/13; left medial leg is healed. 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 Her edema control is good Readmisstion: 05-22-2022 upon evaluation today patient presents for initial inspection here in our clinic concerning issues that she has been having with a wound of the right lateral lower extremity. This is an area of a previous skin graft she tells me. With that being said she unfortunately had a scrape on this that occurred around 10 January. Since that time she has noted that this has just continued to get bigger and bigger in her niece who is present with her today actually states  that 2 weeks ago when she saw that it was significantly smaller than what she sees currently. Obviously this is of utmost concern as we do not want this to continue to get larger when arrested and get moving in the right direction. Fortunately there does not appear to be any signs of systemic infection though locally I think we probably do have some infection present. I would obtain a culture to see what we have going on here and then we will subsequently see where things go going forward. Fortunately I think that she is in the right place at this point being at the wound center we can definitely do something to try to get this moving in the right direction. Patient does have a history of chronic venous insufficiency as well as coronary artery disease. In the past she has done well with compression wraps on the start with a 3 layer wrap I think she is probably can end up going to a 4-layer at some point but we will see how things do over the next week. She has been using wound cleanser along with she tells me a zinc and topical but again I am not sure exactly what that was. 05-29-2022 upon evaluation today  patient appears to be doing well currently in regard to her wound. This actually is showing signs of improvement I am happy in that regard unfortunately her left leg has an area on the shin that has opened. This is the region where one of the areas at least we have previously taken care of. Nonetheless this is small hoping we get it under control before things worsen significantly. 06-05-2022 upon evaluation today patient appears to be doing well currently in regard to her wounds. The actually seem to be fairly clean she is having still quite a bit of problem with pain at this point she would like to not have debridement today for all possible. With that being said I do believe that we are making some good progress I think the [facility] is doing a decent job here. 3/6; patient has had 3/6; patient with known severe chronic venous insufficiency. She apparently had a traumatic wound at home and has a reasonably substantial wound on the right lateral lower leg and a smaller one on the left anterior lower leg as well. We have been using Hydrofera Blue and Unna boots 3/13; patient presents for follow-up. We have been using Hydrofera Blue under Coflex to the legs bilaterally. She has no issues or complaints today. 06-26-2022 upon evaluation today patient appears to be doing well currently in regard to her wound. This is measuring a little bit larger however. Fortunately I do not see any signs of infection this is on the right side. Fortunately the left side is actually completely healed which is great news. 07-03-2022 patient's wound unfortunately is continuing to show signs of being worse. Her compression which was the Tubigrip that I thought should be able to pull up actually slipped down and she was not able to pull that back up. With that being said that means that unfortunately her wound has continued to deteriorate since I last saw her. This is definitely not the direction that we are looking for. I  discussed with her that I do believe we need to go ahead and see about getting her started on antibiotics and I subsequently would also like to go ahead and see about getting things moving forward with regard to a wound culture and making a change  up her dressings as well but this can be changed more frequently than just once a week. 07-10-2022 upon evaluation today patient actually appears to be doing much better. I do believe that the antibiotics have been beneficial for her. Fortunately I do not see any signs of active infection locally nor systemically which is great news. No fevers, chills, nausea, vomiting, or diarrhea. 07-17-2022 upon evaluation today patient appears to be doing well currently in regard to her wound which is actually showing signs of improvement this is slow but nonetheless prevalent that we are seeing good improvement here. Fortunately I do not see any signs of active infection locally nor systemically which is great news. 07-24-2022 upon evaluation today patient appears to be doing well currently in regard to her wound although the wrap that we had on has been slipping down and she really does not have anybody to help her with this at home health is not going to be coming out we could not get anybody that would actually be able to do the dressing changes. Fortunately I do not see any signs of active infection locally nor systemically which is great news. 08-07-22 poorly in regard to her leg compared to what it was previous. Fortunately there does not appear to be any signs of active infection locally nor systemically which is great news. No fevers, chills, nausea, vomiting, or diarrhea. With that being said it does appear that the patient may have some cellulitis in the leg which is not good. 08-14-2022 upon evaluation today patient appears to be doing somewhat better in regard to her wounds she still is having quite a bit of pain we are still not where we want to be as far as  healing is concerned completely. Fortunately I do not see any evidence of active infection locally nor systemically which is great news and I am very pleased in that regard. 08-21-23 upon evaluation today patient appears to be doing poorly currently in regard to her wound. She has been tolerating the dressing changes without complication. Fortunately there does not appear to be any signs of active infection locally nor systemically at this time. 08-28-2022 upon evaluation today patient appears to be doing poorly in regard to her wound this was not wrapped appropriately home health did not go up high enough on the wrap. This has caused some issues and I discussed that with the patient today. I do believe that she is going to require aggressive wrapping and treatment which she is getting the [facility] topical antibiotics today they can start using that at the next wrap on Friday. In the meantime they need to make sure that the rapid and appropriately we wrote very specific orders today. 09-04-2022 upon evaluation today patient appears to be doing well currently in regard to her wound which I think is making progress is still hurting her quite significantly. Fortunately I do not see any signs of active infection locally or systemically which is great news. No fevers, chills, nausea, vomiting, or diarrhea. MADDALYN, NASEEM ([id number]) [id number] Page 8 of 11 09-11-2022 upon evaluation today patient's wound actually is showing signs of being a little bit smaller and looking a little bit better she still has a lot going on here however. Fortunately I do not see any evidence of active infection Worsening locally nor systemically which is great news. With that being said worsening still continue to use the topical Keystone antibiotics. 09-18-2022 upon evaluation today patient actually showing some signs of improvement. I am  actually very pleased with where we stand compared to where  we have been. I think that she is making good headway here. She is still having quite a bit of pain but it seems to be lessening compared to previous. 09-25-2022 upon evaluation patient's wound actually showing signs slowly but surely of improvement. Fortunately I do not see any signs of active infection at this time systemically and locally I feel like this is dramatically improved. She is doing well with the [facility] topical antibiotics. 10-02-2022 upon evaluation today patient appears to be doing better in regard to her wound overall. I am very pleased with where things stand from a visual standpoint I do not see any signs of active infection and in general I think that we are moving in the right direction here. 10-09-2022 upon evaluation today patient appears to be doing well currently in regard to her wound. She has been tolerating the dressing changes without complication. Fortunately I do not see any signs of active infection at this time which is great news. 10-16-2022 upon evaluation today patient continues to have quite a bit of pain in regard to her right leg. We have been attempting to debride little by little as we could but again was pretty limited by the fact that she is having significant discomfort. We do not actually have any signs of active infection going on at this point that the [facility] topical antibiotics have been helping quite readily. I been attempting to do as much debridement as I can but if she were to have pain medication this would actually help and in the past when she did it was much more tolerable for her as far as taking the edge off. Right now she has been without as she is in transition from her previous pain management physician to someone new to manage this. 10-23-2022 upon evaluation patient appears to be doing excellent in regard to her wound. She has been tolerating the dressing changes without complication. Fortunately I do not see any evidence of active infection  locally nor systemically which is great news. No fevers, chills, nausea, vomiting, or diarrhea. 7/24; patient with a wound secondary to chronic venous insufficiency on the right lateral lower leg. We have been using Keystone antibiotic Hydrofera Blue under kerlix Coban. She complains of a lot of pain after last week's debridement otherwise things appear to be improved per discussion with our intake nurse. 11-06-2022 upon evaluation today patient appears to be doing excellent at this point in regard to her wound. She has been tolerating the dressing changes without complication and to be honest she is making excellent progress towards complete closure. I am actually very pleased with where we stand I think that she is doing excellent as far as the overall appearance of the wound is concerned as well. She is going require some debridement however. 11-20-2022 upon evaluation today patient appears to be doing well currently in regard to her wound. She has been tolerating the dressing changes without complication. Fortunately there does not appear to be any signs of active infection locally or systemically which is great news and in general I do believe that we are moving in the right direction here. No fevers, chills, nausea, vomiting, or diarrhea. 11-27-2022 upon evaluation today patient appears to be doing a little better in regards to the overall appearance of her wound but unfortunately she is having increased pain. The collagen seems to be getting very dry and stuck to the wound bed which is causing  her some issues here. Fortunately I do not see any signs of active infection locally nor systemically at this time. Fortunately I think that her wound is better but unfortunately her pain has not. For that reason I am going to avoid any debridement today since the patient is very upset about the pain and how bad this is hurting at this point. I think we can have to try something a little bit different I am  thinking PolyMem may be a good option. 8/28; this patient has difficult venous wounds on the right lateral lower leg and ankle in the setting of chronic venous insufficiency. We have been using polymen under compression with kerlix Coban. 12-11-2022 upon evaluation today patient appears to be doing well currently in regard to her wound. She has been tolerating the dressing changes without complication and actually appears to be doing much better this week compared to when I saw her 2 weeks ago. The wound is measuring significantly smaller. We are using PolyMem along with Kerlix and Coban. 12-18-2022 upon evaluation today patient appears to be doing well currently in regard to her wound. This is actually showing signs of improvement is measuring a little bit smaller and looking better. Fortunately I do not see any evidence of worsening overall and I believe that we will making good headway with the PolyMem and the [facility] topical antibiotics. 12-25-2022 upon evaluation today patient appears to be doing well currently in regard to her wound. She has been tolerating the dressing changes without complication. Fortunately I do not see any evidence of infection locally or systemically which is great news and in general I do believe that we will make an excellent headway towards complete closure also excellent news. 01-01-2023 upon evaluation patient's wound actually showing signs of improvement the wrap was actually put on properly this week and this is good news. Overall I am extremely happy with where things stand and how this appears today I do not see any signs of worsening overall and I believe that the patient is making excellent headway towards complete closure which is great news. 01/08/2023 upon evaluation today patient appears to be doing well currently in regard to her leg. She is actually draining much less unfortunately home health is just not doing very well getting the supplies necessary in order  to continue to take care of the patient. They are now telling me they cannot get the PolyMem which is something that has been doing really well for her. That really does not make sense to me you came to get PolyMem for a hospice patient. Nonetheless either way I think we may need to just discontinue treatment with home health and just manage the patient here at the clinic. 01-15-2023 upon evaluation today patient appears to be doing well currently in regard to her wound. She has been tolerating the dressing changes without complication. Fortunately I do not see any signs of infection I think she is doing quite well and very pleased with where we stand today. No fevers, chills, nausea, vomiting, or diarrhea. 01-22-2023 upon evaluation today patient appears to be doing well currently in regard to her wound. She has been tolerating the dressing changes without complication and both wounds are measuring smaller today and look to be doing excellent. I am actually very pleased with what ever standing here and I think that she is making really good headway towards closure which is great news. 01-29-2023 upon evaluation today patient actually tells me she has been having some  increased pain. Subsequently it took a little bit of drilling down but in the end I realized that we actually ended up putting a full Unna boot on her last week as opposed to just using a good anchor at the top and bottom. I think this is what wound up with her having increased discomfort because she tells me as she was doing well when she came in last time and by the time she left she tells me this was already getting significantly worse. And she hurt most of the week. With that being said I do believe that the patient is doing quite well at this point with regard to her wounds and the wrap did okay it was not a problem other than the increased pain I think we may benefit from a Urgo K2 light compression wrap as this will be much more  comfortable I think compared to what we have been doing with just the Kerlix Coban anyway. 02-05-2023 upon evaluation today patient appears to be doing well currently in regard to her wounds. She has been tolerating the dressing changes without complication. Fortunately I do not see any signs of active infection looking systemically which is great news. No fevers, chills, nausea, vomiting, or diarrhea. 11/6; patient I remember from a previous stay in this clinic With a left lower extremity wound.. She has a right lower extremity venous wound. She has been using Keystone, polymen under [employer]. Her wound now is to remaining open areas. There is no evidence of infection although she is tender in the area. I wonder whether there is stasis dermatitis here. She reminds me that she had some form of skin graft on this area during his stay at the [facility] wound care center presumably in around 2022. TEMESHA, SHUFORD ([id number]) [id number] Page 9 of 11 Objective Constitutional Vitals Time Taken: 9:57 AM, Height: 62 in, Weight: 171 lbs, BMI: 31.3, Temperature: 99.3 F, Pulse: 49 bpm, Respiratory Rate: 18 breaths/min, Blood Pressure: 139/75 mmHg. Integumentary (Hair, Skin) Wound #5 status is Open. Original cause of wound was Trauma. The date acquired was: 04/17/2022. The wound has been in treatment 38 weeks. The wound is located on the Right,Lateral Lower Leg. The wound measures 3.2cm length x 1.4cm width x 0.1cm depth; 3.519cm^2 area and 0.352cm^3 volume. There is Fat Layer (Subcutaneous Tissue) exposed. There is no tunneling or undermining noted. There is a medium amount of serosanguineous drainage noted. The wound margin is distinct with the outline attached to the wound base. There is large (67-100%) red granulation within the wound bed. There is a small (1-33%) amount of necrotic tissue within the wound bed including Adherent Slough. The periwound skin appearance exhibited:  Scarring, Dry/Scaly, Hemosiderin Staining. The periwound skin appearance did not exhibit: Callus, Crepitus, Excoriation, Induration, Rash, Maceration, Atrophie Blanche, Cyanosis, Ecchymosis, Mottled, Pallor, Rubor, Erythema. Periwound temperature was noted as No Abnormality. The periwound has tenderness on palpation. Wound #7 status is Open. Original cause of wound was Gradually Appeared. The date acquired was: 01/01/2023. The wound has been in treatment 6 weeks. The wound is located on the Right,Posterior Lower Leg. The wound measures 3.3cm length x 2.8cm width x 0.1cm depth; 7.257cm^2 area and 0.726cm^3 volume. There is Fat Layer (Subcutaneous Tissue) exposed. There is no tunneling or undermining noted. There is a medium amount of serosanguineous drainage noted. The wound margin is distinct with the outline attached to the wound base. There is large (67-100%) red, pink granulation within the wound bed. There is a  small (1-33%) amount of necrotic tissue within the wound bed including Adherent Slough. The periwound skin appearance exhibited: Scarring, Hemosiderin Staining. The periwound skin appearance did not exhibit: Callus, Crepitus, Excoriation, Induration, Rash, Dry/Scaly, Maceration, Atrophie Blanche, Cyanosis, Ecchymosis, Mottled, Pallor, Rubor, Erythema. Periwound temperature was noted as No Abnormality. The periwound has tenderness on palpation. 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 There was a Double Layer Compression Therapy Procedure by Shawn Stall, RN. Post procedure Diagnosis Wound #5: Same as Pre-Procedure Wound  #7 Pre-procedure diagnosis of Wound #7 is a Venous Leg Ulcer located on the Right,Posterior Lower Leg . There was a Double Layer Compression Therapy Procedure by Shawn Stall, RN. Post procedure Diagnosis Wound #7: Same as Pre-Procedure Plan Follow-up Appointments: Return Appointment in 1 week. - Dr. Leanord Hawking Wednesday (already scheduled) 02/19/2023 1015 Return Appointment in 2 weeks. - Dr. Leanord Hawking Wednesday 02/26/2023 1030 Nurse Visit: - 03/05/2023 (already scheduled) 1030 ****change Wound Care encounter to nurse visit***** Anesthetic: Wound #5 Right,Lateral Lower Leg: (In clinic) Topical Lidocaine 4% applied to wound bed Bathing/ Shower/ Hygiene: May shower and wash wound with soap and water. - with dressing changes. Edema Control - Orders / Instructions: Elevate legs to the level of the heart or above for 30 minutes daily and/or when sitting for 3-4 times a day throughout the day. Avoid standing for long periods of time. Exercise regularly WOUND #5: - Lower Leg Wound Laterality: Right, Lateral Cleanser: Soap and Water 1 x Per Week/30 Days Discharge Instructions: May shower and wash wound with dial antibacterial soap and water prior to dressing change. Peri-Wound Care: Sween Lotion (Moisturizing lotion) 1 x Per Week/30 Days Discharge Instructions: Apply moisturizing lotion as directed Topical: compounding topical antibiotics 1 x Per Week/30 Days Discharge Instructions: apply Keystone directly to wound bed under the hydrofera blue. Home Health start. MIX ACCORDING TO THE PHARMACY INSTRUCTIONS. KEHAULANI, ODEGAARD ([id number]) [id number] Page 10 of 11 Prim Dressing: PolyMem Non-Adhesive Dressing, 4x4 in 1 x Per Week/30 Days ary Discharge Instructions: ******CUT SLITS INTO THE POLYMEM.***** Apply to wound bed as instructed Secondary Dressing: ABD Pad, 8x10 1 x Per Week/30 Days Discharge Instructions: Apply over primary dressing as directed. Secondary  Dressing: Woven Gauze Sponge, Non-Sterile 4x4 in 1 x Per Week/30 Days Discharge Instructions: Apply over primary dressing as directed. Secondary Dressing: Zetuvit Plus 4x8 in 1 x Per Week/30 Days Discharge Instructions: Or double ABD pad Apply over primary dressing as directed. Com pression Wrap: Urgo K2 Lite, (equivalent to a 3 layer) two layer compression system, regular 1 x Per Week/30 Days Discharge Instructions: Apply Urgo K2 Lite as directed (alternative to 3 layer compression). Com pression Wrap: unna boot FIRST LAYER **** SEE INSTRUCTIONS 1 x Per Week/30 Days Discharge Instructions: APPLY FIRST LAYER UNNA BOOT AT BASES OF TOES AND JUST BELOW THE KNEE TO HOLD COMPRESSION WRAP IN PLACE. WOUND #7: - Lower Leg Wound Laterality: Right, Posterior Cleanser: Soap and Water 1 x Per Week/30 Days Discharge Instructions: May shower and wash wound with dial antibacterial soap and water prior to dressing change. Peri-Wound Care: Sween Lotion (Moisturizing lotion) 1 x Per Week/30 Days Discharge Instructions: Apply moisturizing lotion as directed Topical: compounding topical antibiotics 1 x Per  Week/30 Days Discharge Instructions: apply Jodie Echevaria directly to wound bed under the hydrofera blue. Home Health start. MIX ACCORDING TO THE PHARMACY INSTRUCTIONS. Prim Dressing: PolyMem Non-Adhesive Dressing, 4x4 in 1 x Per Week/30 Days ary Discharge Instructions: ******CUT SLITS INTO THE POLYMEM.***** Apply to wound bed as instructed Secondary Dressing: ABD Pad, 8x10 1 x Per Week/30 Days Discharge Instructions: Apply over primary dressing as directed. Secondary Dressing: Woven Gauze Sponge, Non-Sterile 4x4 in 1 x Per Week/30 Days Discharge Instructions: Apply over primary dressing as directed. Secondary Dressing: Zetuvit Plus 4x8 in 1 x Per Week/30 Days Discharge Instructions: Or double ABD pad Apply over primary dressing as directed. Com pression Wrap: Urgo K2 Lite, (equivalent to a 3 layer) two layer  compression system, regular 1 x Per Week/30 Days Discharge Instructions: Apply Urgo K2 Lite as directed (alternative to 3 layer compression). Com pression Wrap: unna boot FIRST LAYER **** SEE INSTRUCTIONS 1 x Per Week/30 Days Discharge Instructions: APPLY FIRST LAYER UNNA BOOT AT BASES OF TOES AND JUST BELOW THE KNEE TO HOLD COMPRESSION WRAP IN PLACE. 1. I did not change the primary dressing which is [employer] under [employer]. I did use TCA to the inflamed skin in the periwound. 2. No evidence of infection here. 3. Her edema control is good and the wounds are getting smaller. Careful attention to the dimensions of the 2 area next time Electronic Signature(s) Signed: 02/13/2023 4:34:18 PM By: [name] MD Signed: 02/13/2023 6:10:25 PM By: Shawn Stall RN, BSN Previous Signature: 02/12/2023 4:54:50 PM Version By: [name] MD Entered By: Shawn Stall on 02/13/2023 16:03:08 -------------------------------------------------------------------------------- SuperBill Details Patient Name: Date of Service: Jamie [name], [name] 02/12/2023 Medical Record Number: [id number] Patient Account Number: [id number] Date of Birth/Sex: Treating RN: 05-28-1940 (82 y.o. F) Primary Care Provider: Belva Agee Other Clinician: Referring Provider: Treating Provider/Extender: Vivia Budge in Treatment: 38 Diagnosis Coding ICD-10 Codes Code Description [id number] Chronic venous hypertension (idiopathic) with ulcer and inflammation of right lower extremity 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d I25.10 Atherosclerotic heart disease of native coronary artery without angina pectoris Facility Procedures : TIAWANNA, ANDREW Code: [id number] Anzal W (0 Description: (Facility Use Only) [phone] - APPLY MULTLAY COMPRS LWR RT LEG ICD-10 Diagnosis Description I87.331 Chronic venous hypertension (idiopathic) with ulcer  and inflammation of right lower L97.812 Non-pressure chronic ulcer of other part of right  lower leg with fat layer exposed I25.10 Atherosclerotic heart disease of native coronary artery without angina pectoris [id number]) [phone] Modifier: [email or web address] Quantity: 1 .pdf Page 11 of 11 Physician Procedures : CPT4 Code Description Modifier [id number] - WC PHYS LEVEL 3 - EST PT ICD-10 Diagnosis Description 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d I87.331 Chronic venous hypertension (idiopathic) with ulcer  and inflammation of right lower extremity Quantity: 1 Electronic Signature(s) Signed: 02/13/2023 8:39:21 AM By: Pearletha Alfred Signed: 02/13/2023 4:34:18 PM By: [name] MD Previous Signature: 02/12/2023 4:54:50 PM Version By: [name] MD Entered By: Pearletha Alfred on 02/13/2023 08:39:21

## 2023-02-14 NOTE — Progress Notes (Signed)
MADESYN, PANGELINAN ([id number]) [id number] Page 1 of 8 Visit Report for 02/12/2023 Arrival Information Details Patient Name: Date of Service: [name], [name]. 02/12/2023 10:15 A M Medical Record Number: [id number] Patient Account Number: [id number] Date of Birth/Sex: Treating RN: [birth year] (82 y.o. F) Primary Care Raheim Beutler: Belva Agee Other Clinician: Referring Elbert Spickler: Treating Johnnie Moten/Extender: Vivia Budge in Treatment: 38 Visit Information History Since Last Visit Added or deleted any medications: No Patient Arrived: Wheel Chair Any new allergies or adverse reactions: No Arrival Time: 09:51 Had a fall or experienced change in No Accompanied By: niece activities of daily living that may affect Transfer Assistance: None risk of falls: Patient Identification Verified: Yes Signs or symptoms of abuse/neglect since last visito No Secondary Verification Process Completed: Yes Hospitalized since last visit: No Patient Requires Transmission-Based Precautions: No Implantable device outside of the clinic excluding No Patient Has Alerts: No cellular tissue based products placed in the center since last visit: Has Dressing in Place as Prescribed: Yes Has Compression in Place as Prescribed: Yes Pain Present Now: Yes Electronic Signature(s) Signed: 02/14/2023 12:52:25 PM By: Thayer Dallas Entered By: Thayer Dallas on 02/12/2023 09:52:10 -------------------------------------------------------------------------------- Compression Therapy Details Patient Name: Date of Service: [name], [name] 02/12/2023 10:15 A M Medical Record Number: [id number] Patient Account Number: [id number] Date of Birth/Sex: Treating RN: [birth year] (82 y.o. F) Primary Care Cheskel Silverio: Belva Agee Other Clinician: Referring Lajada Janes: Treating Jerrell Hart/Extender: Vivia Budge in Treatment:  38 Compression Therapy Performed for Wound Assessment: Wound #5 Right,Lateral Lower Leg Performed By: Clinician Shawn Stall, RN Compression Type: Double Layer Post Procedure Diagnosis Same as Pre-procedure Electronic Signature(s) Signed: 02/13/2023 8:38:52 AM By: Pearletha Alfred Entered By: Pearletha Alfred on 02/13/2023 08:38:51 [name] ([id number]) [id number] Page 2 of 8 -------------------------------------------------------------------------------- Compression Therapy Details Patient Name: Date of Service: [name], [name]. 02/12/2023 10:15 A M Medical Record Number: [id number] Patient Account Number: [id number] Date of Birth/Sex: Treating RN: [birth year] (82 y.o. F) Primary Care Cariah Salatino: Belva Agee Other Clinician: Referring Linnette Panella: Treating Dorrine Montone/Extender: Vivia Budge in Treatment: 38 Compression Therapy Performed for Wound Assessment: Wound #7 Right,Posterior Lower Leg Performed By: Clinician Shawn Stall, RN Compression Type: Double Layer Post Procedure Diagnosis Same as Pre-procedure Electronic Signature(s) Signed: 02/13/2023 8:38:52 AM By: Pearletha Alfred Entered By: Pearletha Alfred on 02/13/2023 08:38:52 -------------------------------------------------------------------------------- Lower Extremity Assessment Details Patient Name: Date of Service: [name] 02/12/2023 10:15 A M Medical Record Number: [id number] Patient Account Number: [id number] Date of Birth/Sex: Treating RN: 04/11/1940 (82 y.o. Orville Govern Primary Care Joncarlos Atkison: Belva Agee Other Clinician: Referring Regnald Bowens: Treating Kabir Brannock/Extender: Vivia Budge in Treatment: 38 Edema Assessment Assessed: [name]: No] [Right: No] Edema: [Left: Ye] [Right: s] Calf Left: Right: Point of Measurement: From Medial Instep 34 cm Ankle Left: Right: Point of Measurement: From Medial  Instep 23.5 cm Vascular Assessment Pulses: Dorsalis Pedis Palpable: [Right:Yes] Extremity colors, hair growth, and conditions: Extremity Color: [Right:Normal] Hair Growth on Extremity: [Right:No] Temperature of Extremity: [Right:Warm] Capillary Refill: [Right:< 3 seconds] Dependent Rubor: [Right:No No] Electronic Signature(s) Signed: 02/12/2023 3:58:55 PM By: Redmond Pulling RN, BSN Entered By: Redmond Pulling on 02/12/2023 09:58:31 [name] ([id number]) [id number] Page 3 of 8 -------------------------------------------------------------------------------- Multi Wound Chart Details Patient Name: Date of Service: [name], [name]. 02/12/2023 10:15 A M Medical Record Number: [id number] Patient Account Number: [id number] Date of Birth/Sex: Treating RN: [birth year] (82 y.o. 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 N/A Primary Etiology: Sleep Apnea, Hypertension, Peripheral Sleep Apnea, Hypertension, Peripheral N/A Comorbid History: Venous Disease, Osteoarthritis, Venous Disease, Osteoarthritis, Neuropathy Neuropathy 04/17/2022 01/01/2023 N/A Date Acquired: 38 6 N/A Weeks of Treatment: Open Open N/A Wound Status: No No N/A Wound Recurrence: Yes No N/A Clustered Wound: 1 N/A N/A Clustered Quantity: 3.2x1.4x0.1 3.3x2.8x0.1 N/A Measurements L x W x D (cm) 3.519 7.257 N/A A (cm) : rea 0.352 0.726 N/A Volume (cm) : 86.60% 38.20% N/A % Reduction in Area: 95.50%  69.10% N/A % Reduction in Volume: Full Thickness Without Exposed Full Thickness Without Exposed N/A Classification: Support Structures Support Structures Medium Medium N/A Exudate Amount: Serosanguineous Serosanguineous N/A Exudate Type: red, brown red, brown N/A Exudate Color: Distinct, outline attached Distinct, outline attached N/A Wound Margin: Large (67-100%) Large (67-100%) N/A Granulation Amount: Red Red, Pink N/A Granulation Quality: Small (1-33%) Small (1-33%) N/A Necrotic Amount: Fat Layer (Subcutaneous Tissue): Yes Fat Layer (Subcutaneous Tissue): Yes N/A Exposed Structures: Fascia: No Fascia: No Tendon: No Tendon: No Muscle: No Muscle: No Joint: No Joint: No Bone: No Bone: No Medium (34-66%) Small (1-33%) N/A Epithelialization: Scarring: Yes Scarring: Yes N/A Periwound Skin Texture: Excoriation: No Excoriation: No Induration: No Induration: No Callus: No Callus: No Crepitus: No Crepitus: No Rash: No Rash: No Dry/Scaly: Yes Maceration: No N/A Periwound Skin Moisture: Maceration: No Dry/Scaly: No Hemosiderin Staining: Yes Hemosiderin Staining: Yes N/A Periwound Skin Color: Atrophie Blanche: No Atrophie Blanche: No Cyanosis: No Cyanosis: No Ecchymosis: No Ecchymosis: No Erythema: No Erythema: No Mottled: No Mottled: No Pallor: No Pallor: No MYRIAN, KRET ([id number]) [id number] Page 4 of 8 Rubor: No Rubor: No No Abnormality No Abnormality N/A Temperature: Yes Yes N/A Tenderness on Palpation: Treatment Notes Electronic Signature(s) Signed: 02/12/2023 4:54:50 PM By: [name] MD Entered By: [name] on 02/12/2023 10:21:17 -------------------------------------------------------------------------------- Multi-Disciplinary Care Plan Details Patient Name: Date of Service: [name], [place] [name] 02/12/2023 10:15 A M Medical Record Number: [id number] Patient Account Number: [id number] Date  of Birth/Sex: Treating RN: [birth year] (82 y.o. Debara Pickett, Yvonne Kendall Primary Care Gwynevere Lizana: Belva Agee Other Clinician: Referring Antionetta Ator: Treating Yashika Mask/Extender: Vivia Budge in Treatment: 38 Multidisciplinary Care Plan reviewed with physician Active Inactive Pain, Acute or Chronic Nursing Diagnoses: Pain Management - Cyclic Acute (Dressing Change Related) Pain Management - Non-cyclic Acute (Procedural) Pain, acute or chronic: actual or potential Goals: Patient will verbalize adequate pain control and receive pain control interventions during procedures as needed Date Initiated: 09/11/2022 Target Resolution Date: 04/08/2023 Goal Status: Active Patient/caregiver will verbalize adequate pain control between visits Date Initiated: 09/11/2022 Target Resolution Date: 03/07/2023 Goal Status: Active Patient/caregiver will verbalize comfort level met Date Initiated: 09/11/2022 Target Resolution Date: 04/08/2023 Goal Status: Active Interventions: Complete pain assessment as per visit requirements Encourage patient to take pain medications as prescribed Provide education on pain management Provision of support: recognize patient pain, provide comfort and support as needed Reposition patient for comfort Treatment Activities: Administer pain control measures as ordered : 09/11/2022 Notes: Electronic Signature(s) Signed: 02/12/2023 6:03:26 PM By: Shawn Stall RN, BSN Entered By: Shawn Stall on 02/12/2023 10:09:36 [name] ([id number]) [id number] Page 5 of 8 -------------------------------------------------------------------------------- Pain Assessment Details  Patient Name: Date of Service: [name], [name]. 02/12/2023 10:15 A M Medical Record Number: [id number] Patient Account Number: [id number] Date of Birth/Sex: Treating RN: 1941/04/02 (82 y.o. F) Primary Care Reene Harlacher: Belva Agee Other Clinician: Referring  Odyn Turko: Treating Jacarius Handel/Extender: Vivia Budge in Treatment: 38 Active Problems Location of Pain Severity and Description of Pain Patient Has Paino Yes Site Locations Pain Location: Pain in Ulcers Rate the pain. Current Pain Level: 10 Character of Pain Describe the Pain: Shooting, Stabbing Pain Management and Medication Current Pain Management: Electronic Signature(s) Signed: 02/14/2023 12:52:25 PM By: Thayer Dallas Entered By: Thayer Dallas on 02/12/2023 09:52:27 -------------------------------------------------------------------------------- Patient/Caregiver Education Details Patient Name: Date of Service: [name] 11/6/2024andnbsp10:15 A M Medical Record Number: [id number] Patient Account Number: [id number] Date of Birth/Gender: Treating RN: November 29, 1940 (82 y.o. [name] Primary Care Physician: Belva Agee Other Clinician: Referring Physician: Treating Physician/Extender: Vivia Budge in Treatment: 91 Education Assessment Education Provided To: Patient Education Topics Provided Wound/Skin Impairment: Handouts: Caring for Your Ulcer Methods: Explain/Verbal Responses: Reinforcements needed LACONDA, HERSKOVITZ ([id number]) [phone].pdf Page 6 of 8 Electronic Signature(s) Signed: 02/12/2023 6:03:26 PM By: Shawn Stall RN, BSN Entered By: Shawn Stall on 02/12/2023 10:09:52 -------------------------------------------------------------------------------- Wound Assessment Details Patient Name: Date of Service: [name], [name] 02/12/2023 10:15 A M Medical Record Number: [id number] Patient Account Number: [id number] Date of Birth/Sex: Treating RN: 12/13/1940 (82 y.o. Orville Govern Primary Care Shelda Truby: Belva Agee Other Clinician: Referring Claressa Hughley: Treating Lynel Forester/Extender: [name] Weeks in Treatment:  38 Wound Status Wound Number: 5 Primary Venous Leg Ulcer Etiology: Wound Location: Right, Lateral Lower Leg Wound Open Wounding Event: Trauma Status: Date Acquired: 04/17/2022 Comorbid Sleep Apnea, Hypertension, Peripheral Venous Disease, Weeks Of Treatment: 38 History: Osteoarthritis, Neuropathy Clustered Wound: Yes Photos Wound Measurements Length: (cm) Width: (cm) Depth: (cm) Clustered Quantity: Area: (cm) Volume: (cm) 3.2 % Reduction in Area: 86.6% 1.4 % Reduction in Volume: 95.5% 0.1 Epithelialization: Medium (34-66%) 1 Tunneling: No 3.519 Undermining: No 0.352 Wound Description Classification: Full Thickness Without Exposed Supp Wound Margin: Distinct, outline attached Exudate Amount: Medium Exudate Type: Serosanguineous Exudate Color: red, brown ort Structures Foul Odor After Cleansing: No Slough/Fibrino Yes Wound Bed Granulation Amount: Large (67-100%) Exposed Structure Granulation Quality: Red Fascia Exposed: No Necrotic Amount: Small (1-33%) Fat Layer (Subcutaneous Tissue) Exposed: Yes Necrotic Quality: Adherent Slough Tendon Exposed: No Muscle Exposed: No Joint Exposed: No Bone Exposed: No Periwound Skin Texture Texture Color No Abnormalities Noted: No No Abnormalities Noted: No Callus: No Atrophie Blanche: No Crepitus: No Cyanosis: No Excoriation: No Ecchymosis: No Induration: No Erythema: No LYNITA, BUETOW ([id number]) [phone].pdf Page 7 of 8 Rash: No Hemosiderin Staining: Yes Scarring: Yes Mottled: No Pallor: No Moisture Rubor: No No Abnormalities Noted: No Dry / Scaly: Yes Temperature / Pain Maceration: No Temperature: No Abnormality Tenderness on Palpation: Yes Electronic Signature(s) Signed: 02/12/2023 3:58:55 PM By: Redmond Pulling RN, BSN Entered By: Redmond Pulling on 02/12/2023 10:00:40 -------------------------------------------------------------------------------- Wound Assessment  Details Patient Name: Date of Service: [name], [name] 02/12/2023 10:15 A M Medical Record Number: [id number] Patient Account Number: [id number] Date of Birth/Sex: Treating RN: 04/20/1940 (82 y.o. Orville Govern Primary Care Johnni Wunschel: Belva Agee Other Clinician: Referring Carole Deere: Treating Alexiana Laverdure/Extender: [name] Weeks in Treatment: 38 Wound Status Wound Number: 7 Primary Venous Leg Ulcer Etiology: Wound Location: Right, Posterior Lower Leg Wound Open Wounding Event: Gradually Appeared Status: Date Acquired: 01/01/2023 Comorbid Sleep Apnea, Hypertension,  Peripheral Venous Disease, Weeks Of Treatment: 6 History: Osteoarthritis, Neuropathy Clustered Wound: No Photos Wound Measurements Length: (cm) 3.3 Width: (cm) 2.8 Depth: (cm) 0.1 Area: (cm) 7.257 Volume: (cm) 0.726 % Reduction in Area: 38.2% % Reduction in Volume: 69.1% Epithelialization: Small (1-33%) Tunneling: No Undermining: No Wound Description Classification: Full Thickness Without Exposed Support Structures Wound Margin: Distinct, outline attached Exudate Amount: Medium Exudate Type: Serosanguineous Exudate Color: red, brown Foul Odor After Cleansing: No Slough/Fibrino Yes Wound Bed Granulation Amount: Large (67-100%) Exposed Structure Granulation Quality: Red, Pink Fascia Exposed: No Necrotic Amount: Small (1-33%) Fat Layer (Subcutaneous Tissue) Exposed: Yes Necrotic Quality: Adherent Slough Tendon Exposed: No Muscle Exposed: No Joint Exposed: No Bone Exposed: No ANNAYAH, MUNZ ([id number]) [id number] Page 8 of 8 Periwound Skin Texture Texture Color No Abnormalities Noted: No No Abnormalities Noted: No Callus: No Atrophie Blanche: No Crepitus: No Cyanosis: No Excoriation: No Ecchymosis: No Induration: No Erythema: No Rash: No Hemosiderin Staining: Yes Scarring: Yes Mottled: No Pallor: No Moisture Rubor:  No No Abnormalities Noted: No Dry / Scaly: No Temperature / Pain Maceration: No Temperature: No Abnormality Tenderness on Palpation: Yes Electronic Signature(s) Signed: 02/12/2023 3:58:55 PM By: Redmond Pulling RN, BSN Entered By: Redmond Pulling on 02/12/2023 10:01:59 -------------------------------------------------------------------------------- Vitals Details Patient Name: Date of Service: [name], [place] [name] 02/12/2023 10:15 A M Medical Record Number: [id number] Patient Account Number: [id number] Date of Birth/Sex: Treating RN: [birth year] (82 y.o. Orville Govern Primary Care Babbette Dalesandro: Belva Agee Other Clinician: Referring Sanders Manninen: Treating Natasja Niday/Extender: Vivia Budge in Treatment: 38 Vital Signs Time Taken: 09:57 Temperature (F): 99.3 Height (in): 62 Pulse (bpm): 49 Weight (lbs): 171 Respiratory Rate (breaths/min): 18 Body Mass Index (BMI): 31.3 Blood Pressure (mmHg): 139/75 Reference Range: 80 - 120 mg / dl Electronic Signature(s) Signed: 02/12/2023 3:58:55 PM By: Redmond Pulling RN, BSN Entered By: Redmond Pulling on 02/12/2023 09:57:32

## 2023-02-19 ENCOUNTER — Encounter (HOSPITAL_BASED_OUTPATIENT_CLINIC_OR_DEPARTMENT_OTHER): Payer: Medicare HMO | Admitting: Internal Medicine

## 2023-02-19 DIAGNOSIS — L97812 Non-pressure chronic ulcer of other part of right lower leg with fat layer exposed: Secondary | ICD-10-CM | POA: Diagnosis not present

## 2023-02-20 NOTE — Progress Notes (Signed)
MACKYNZI, HAFLER ([id number]) [id number] Page 1 of 10 Visit Report for 02/19/2023 Arrival Information Details Patient Name: Date of Service: Jamie [name], [name]. 02/19/2023 10:30 A M Medical Record Number: [id number] Patient Account Number: [id number] Date of Birth/Sex: Treating RN: 1940/12/18 (82 y.o. Jamie [name] Primary Care Karrissa Parchment: Belva Agee Other Clinician: Referring Cana Mignano: Treating Martino Tompson/Extender: Vivia Budge in Treatment: 39 Visit Information History Since Last Visit Added or deleted any medications: No Patient Arrived: Wheel Chair Any new allergies or adverse reactions: No Arrival Time: 10:48 Had a fall or experienced change in No Accompanied By: daughter activities of daily living that may affect Transfer Assistance: Manual risk of falls: Patient Identification Verified: Yes Signs or symptoms of abuse/neglect since last visito No Patient Requires Transmission-Based Precautions: No Hospitalized since last visit: No Patient Has Alerts: No Implantable device outside of the clinic excluding No cellular tissue based products placed in the center since last visit: Has Dressing in Place as Prescribed: Yes Has Compression in Place as Prescribed: Yes Pain Present Now: Yes Electronic Signature(s) Signed: 02/19/2023 5:11:37 PM By: Karie Schwalbe RN Entered By: Karie Schwalbe on 02/19/2023 07:48:54 -------------------------------------------------------------------------------- Encounter Discharge Information Details Patient Name: Date of Service: Jamie [name], [name] 02/19/2023 10:30 A M Medical Record Number: [id number] Patient Account Number: [id number] Date of Birth/Sex: Treating RN: 1940-09-26 (82 y.o. Jamie [name] Primary Care Manila Rommel: Belva Agee Other Clinician: Referring Azaliah Carrero: Treating Miliano Cotten/Extender: Vivia Budge in Treatment:  39 Encounter Discharge Information Items Post Procedure Vitals Discharge Condition: Stable Temperature (F): 99.1 Ambulatory Status: Wheelchair Pulse (bpm): 52 Discharge Destination: Home Respiratory Rate (breaths/min): 18 Transportation: Private Auto Blood Pressure (mmHg): 124/72 Accompanied By: daughter Schedule Follow-up Appointment: Yes Clinical Summary of Care: Patient Declined Electronic Signature(s) Signed: 02/19/2023 5:11:37 PM By: Karie Schwalbe RN Entered By: Karie Schwalbe on 02/19/2023 09:40:31 [name] ([id number]) [id number] Page 2 of 10 -------------------------------------------------------------------------------- Lower Extremity Assessment Details Patient Name: Date of Service: Jamie [name], [name]. 02/19/2023 10:30 A M Medical Record Number: [id number] Patient Account Number: [id number] Date of Birth/Sex: Treating RN: [birth year] (82 y.o. Jamie [name] Primary Care Climmie Cronce: Belva Agee Other Clinician: Referring Kyan Giannone: Treating Akeila Lana/Extender: Vivia Budge in Treatment: 39 Edema Assessment Assessed: [name]: No] [Right: No] Edema: [Left: Ye] [Right: s] Calf Left: Right: Point of Measurement: From Medial Instep 34.3 cm Ankle Left: Right: Point of Measurement: From Medial Instep 23.9 cm Vascular Assessment Pulses: Dorsalis Pedis Palpable: [Right:Yes] Extremity colors, hair growth, and conditions: Extremity Color: [Right:Normal] Hair Growth on Extremity: [Right:No] Temperature of Extremity: [Right:Warm] Capillary Refill: [Right:< 3 seconds] Dependent Rubor: [Right:No No] Electronic Signature(s) Signed: 02/19/2023 3:50:36 PM By: Redmond Pulling RN, BSN Signed: 02/19/2023 5:11:37 PM By: Karie Schwalbe RN Entered By: Karie Schwalbe on 02/19/2023 07:50:21 -------------------------------------------------------------------------------- Multi Wound Chart Details Patient  Name: Date of Service: Jamie Brink, MA [name] 02/19/2023 10:30 A M Medical Record Number: [id number] Patient Account Number: [id number] Date of Birth/Sex: Treating RN: March 21, 1941 (82 y.o. 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 N/A Primary Etiology: Sleep Apnea, Hypertension, Peripheral Sleep Apnea, Hypertension, Peripheral N/A Comorbid History:  Venous Disease, Osteoarthritis, Venous Disease, Osteoarthritis, Neuropathy Neuropathy 04/17/2022 01/01/2023 N/A Date Acquired: 39 7 N/A Weeks of Treatment: Open Open N/A Wound Status: No No N/A Wound Recurrence: Yes No N/A Clustered Wound: 1 N/A N/A Clustered Quantity: 2.4x0.8x0.1 2.9x2.7x0.1 N/A Measurements L x W x D (cm) 1.508 6.15 N/A A (cm) : rea 0.151 0.615 N/A Volume (cm) : 94.20% 47.70% N/A % Reduction in A rea: 98.10% 73.80% N/A % Reduction in Volume: Full Thickness Without Exposed Full Thickness Without Exposed N/A Classification: Support Structures Support Structures Medium Medium N/A Exudate A mount: Serosanguineous Serosanguineous N/A Exudate Type: red, brown red, brown N/A Exudate Color: Distinct, outline attached Distinct, outline attached N/A Wound Margin: Large (67-100%) Large (67-100%) N/A Granulation A mount: Red Red, Pink N/A Granulation Quality: Small (1-33%) Small (1-33%) N/A Necrotic A mount: Fat Layer (Subcutaneous Tissue): Yes Fat Layer (Subcutaneous Tissue): Yes  N/A Exposed Structures: Fascia: No Fascia: No Tendon: No Tendon: No Muscle: No Muscle: No Joint: No Joint: No Bone: No Bone: No Medium (34-66%) Small (1-33%) N/A Epithelialization: Debridement - Excisional Debridement - Excisional N/A Debridement: Pre-procedure Verification/Time Out 11:18 11:18 N/A Taken: Lidocaine 4% Topical Solution Lidocaine 4% Topical Solution N/A Pain Control: Necrotic/Eschar, Callus, SubcutaneousNecrotic/Eschar, Callus, SubcutaneousN/A Tissue Debrided: Skin/Subcutaneous Tissue Skin/Subcutaneous Tissue N/A Level: 1.51 6.15 N/A Debridement A (sq cm): rea Curette Curette N/A Instrument: Minimum Minimum N/A Bleeding: Pressure Pressure N/A Hemostasis A chieved: Procedure was tolerated well Procedure was tolerated well N/A Debridement Treatment Response: 2.4x0.8x0.1 2.9x2.7x0.1 N/A Post Debridement Measurements L x W x D (cm) 0.151 0.615 N/A Post Debridement Volume: (cm) Scarring: Yes Scarring: Yes N/A Periwound Skin Texture: Excoriation: No Excoriation: No Induration: No Induration: No Callus: No Callus: No Crepitus: No Crepitus: No Rash: No Rash: No Dry/Scaly: Yes Maceration: No N/A Periwound Skin Moisture: Maceration: No Dry/Scaly: No Hemosiderin Staining: Yes Hemosiderin Staining: Yes N/A Periwound Skin Color: Atrophie Blanche: No Atrophie Blanche: No Cyanosis: No Cyanosis: No Ecchymosis: No Ecchymosis: No Erythema: No Erythema: No Mottled: No Mottled: No Pallor: No Pallor: No Rubor: No Rubor: No No Abnormality No Abnormality N/A Temperature: Yes Yes N/A Tenderness on Palpation: Debridement Debridement N/A Procedures Performed: Treatment Notes Wound #5 (Lower Leg) Wound Laterality: Right, Lateral Cleanser Soap and Water Discharge Instruction: May shower and wash wound with dial antibacterial soap and water prior to dressing change. Peri-Wound Care JOLIA, SERAFINI ([id number])  [id number] Page 4 of 10 Sween Lotion (Moisturizing lotion) Discharge Instruction: Apply moisturizing lotion as directed Topical compounding topical antibiotics Discharge Instruction: apply Jodie Echevaria directly to wound bed under the hydrofera blue. Home Health start. MIX ACCORDING TO THE PHARMACY INSTRUCTIONS. Primary Dressing PolyMem Non-Adhesive Dressing, 4x4 in Discharge Instruction: ******CUT SLITS INTO THE POLYMEM.***** Apply to wound bed as instructed Secondary Dressing ABD Pad, 8x10 Discharge Instruction: Apply over primary dressing as directed. Woven Gauze Sponge, Non-Sterile 4x4 in Discharge Instruction: Apply over primary dressing as directed. Zetuvit Plus 4x8 in Discharge Instruction: Or double ABD pad Apply over primary dressing as directed. Secured With Compression Wrap Urgo K2 Lite, (equivalent to a 3 layer) two layer compression system, regular Discharge Instruction: Apply Urgo K2 Lite as directed (alternative to 3 layer compression). unna boot FIRST LAYER **** SEE INSTRUCTIONS Discharge Instruction: APPLY FIRST LAYER UNNA BOOT AT BASES OF TOES AND JUST BELOW THE KNEE TO HOLD COMPRESSION WRAP IN PLACE. Compression Stockings Add-Ons Wound #7 (Lower Leg) Wound Laterality: Right, Posterior Cleanser Soap and Water Discharge Instruction: May shower and wash wound with dial antibacterial soap and water prior to  dressing change. Peri-Wound Care Sween Lotion (Moisturizing lotion) Discharge Instruction: Apply moisturizing lotion as directed Topical compounding topical antibiotics Discharge Instruction: apply Keystone directly to wound bed under the hydrofera blue. Home Health start. MIX ACCORDING TO THE PHARMACY INSTRUCTIONS. Primary Dressing PolyMem Non-Adhesive Dressing, 4x4 in Discharge Instruction: ******CUT SLITS INTO THE POLYMEM.***** Apply to wound bed as instructed Secondary Dressing ABD Pad, 8x10 Discharge Instruction: Apply over  primary dressing as directed. Woven Gauze Sponge, Non-Sterile 4x4 in Discharge Instruction: Apply over primary dressing as directed. Zetuvit Plus 4x8 in Discharge Instruction: Or double ABD pad Apply over primary dressing as directed. Secured With Compression Wrap Urgo K2 Lite, (equivalent to a 3 layer) two layer compression system, regular Discharge Instruction: Apply Urgo K2 Lite as directed (alternative to 3 layer compression). unna boot FIRST LAYER **** SEE INSTRUCTIONS Discharge Instruction: APPLY FIRST LAYER UNNA BOOT AT BASES OF TOES AND JUST BELOW THE KNEE TO HOLD COMPRESSION WRAP IN PLACE. Compression Stockings Add-Ons VICTORA, SPITTLER ([id number]) [id number] Page 5 of 10 Electronic Signature(s) Signed: 02/20/2023 4:06:32 PM By: [name] MD Entered By: [name] on 02/19/2023 09:00:44 -------------------------------------------------------------------------------- Multi-Disciplinary Care Plan Details Patient Name: Date of Service: Jamie Brink, MA [name] 02/19/2023 10:30 A M Medical Record Number: [id number] Patient Account Number: [id number] Date of Birth/Sex: Treating RN: Aug 23, 1940 (82 y.o. Jamie [name] Primary Care Tashanna Dolin: Belva Agee Other Clinician: Referring Annasofia Pohl: Treating Alaine Loughney/Extender: Vivia Budge in Treatment: 39 Multidisciplinary Care Plan reviewed with physician Active Inactive Pain, Acute or Chronic Nursing Diagnoses: Pain Management - Cyclic Acute (Dressing Change Related) Pain Management - Non-cyclic Acute (Procedural) Pain, acute or chronic: actual or potential Goals: Patient will verbalize adequate pain control and receive pain control interventions during procedures as needed Date Initiated: 09/11/2022 Target Resolution Date: 05/09/2023 Goal Status: Active Patient/caregiver will verbalize adequate pain control between visits Date Initiated: 09/11/2022 Target  Resolution Date: 05/09/2023 Goal Status: Active Patient/caregiver will verbalize comfort level met Date Initiated: 09/11/2022 Target Resolution Date: 05/09/2023 Goal Status: Active Interventions: Complete pain assessment as per visit requirements Encourage patient to take pain medications as prescribed Provide education on pain management Provision of support: recognize patient pain, provide comfort and support as needed Reposition patient for comfort Treatment Activities: Administer pain control measures as ordered : 09/11/2022 Notes: Electronic Signature(s) Signed: 02/19/2023 5:11:37 PM By: Karie Schwalbe RN Entered By: Karie Schwalbe on 02/19/2023 08:01:50 -------------------------------------------------------------------------------- Pain Assessment Details Patient Name: Date of Service: Gevena Mart W. 02/19/2023 10:30 A M Medical Record Number: [id number] Patient Account Number: [id number] Date of Birth/Sex: Treating RN: 1940-09-09 (82 y.o. Jamie [name] Primary Care Desirre Eickhoff: Belva Agee Other Clinician: KEYANNA, CANNELLA ([id number]) [id number] Page 6 of 10 Referring Tashayla Therien: Treating Kariana Wiles/Extender: Vivia Budge in Treatment: 39 Active Problems Location of Pain Severity and Description of Pain Patient Has Paino Yes Site Locations Pain Location: Generalized Pain With Dressing Change: No Duration of the Pain. Constant / Intermittento Constant Rate the pain. Current Pain Level: 7 Worst Pain Level: 10 Least Pain Level: 6 Tolerable Pain Level: 4 Character of Pain Describe the Pain: Difficult to Pinpoint Pain Management and Medication Current Pain Management: Medication: Yes Cold Application: No Rest: Yes Massage: No Activity: No T.E.N.S.: No Heat Application: No Leg drop or elevation: No Is the Current Pain Management Adequate: Adequate How does your wound impact your activities  of daily livingo Sleep: No Bathing: No Appetite: No Relationship With Others: No Bladder Continence: No Emotions: No Bowel Continence: No Work:  No Toileting: No Drive: No Dressing: No Hobbies: No Electronic Signature(s) Signed: 02/19/2023 5:11:37 PM By: Karie Schwalbe RN Entered By: Karie Schwalbe on 02/19/2023 07:50:13 -------------------------------------------------------------------------------- Patient/Caregiver Education Details Patient Name: Date of Service: [name] 11/13/2024andnbsp10:30 A M Medical Record Number: [id number] Patient Account Number: [id number] Date of Birth/Gender: Treating RN: 11/30/1940 (82 y.o. Jamie [name] Primary Care Physician: Belva Agee Other Clinician: Referring Physician: Treating Physician/Extender: Vivia Budge in Treatment: 39 Education Assessment Education Provided To: Patient Education Topics Provided RINA, BRINSER ([id number]) [id number] Page 7 of 10 Electronic Signature(s) Signed: 02/19/2023 5:11:37 PM By: Karie Schwalbe RN Entered By: Karie Schwalbe on 02/19/2023 08:02:01 -------------------------------------------------------------------------------- Wound Assessment Details Patient Name: Date of Service: [name] 02/19/2023 10:30 A M Medical Record Number: [id number] Patient Account Number: [id number] Date of Birth/Sex: Treating RN: 1940-11-09 (82 y.o. Jamie [name] Primary Care Jaiel Saraceno: Belva Agee Other Clinician: Referring Tyjah Hai: Treating Daisean Brodhead/Extender: [name] Weeks in Treatment: 39 Wound Status Wound Number: 5 Primary Venous Leg Ulcer Etiology: Wound Location: Right, Lateral Lower Leg Wound Open Wounding Event: Trauma Status: Date Acquired: 04/17/2022 Comorbid Sleep Apnea, Hypertension, Peripheral Venous Disease, Weeks Of Treatment: 39 History: Osteoarthritis,  Neuropathy Clustered Wound: Yes Photos Wound Measurements Length: (cm) Width: (cm) Depth: (cm) Clustered Quantity: Area: (cm) Volume: (cm) 2.4 % Reduction in Area: 94.2% 0.8 % Reduction in Volume: 98.1% 0.1 Epithelialization: Medium (34-66%) 1 Tunneling: No 1.508 Undermining: No 0.151 Wound Description Classification: Full Thickness Without Exposed Supp Wound Margin: Distinct, outline attached Exudate Amount: Medium Exudate Type: Serosanguineous Exudate Color: red, brown ort Structures Foul Odor After Cleansing: No Slough/Fibrino Yes Wound Bed Granulation Amount: Large (67-100%) Exposed Structure Granulation Quality: Red Fascia Exposed: No Necrotic Amount: Small (1-33%) Fat Layer (Subcutaneous Tissue) Exposed: Yes Necrotic Quality: Adherent Slough Tendon Exposed: No Muscle Exposed: No Joint Exposed: No Bone Exposed: No Periwound Skin Texture Texture Color No Abnormalities Noted: No No Abnormalities Noted: No Callus: No Atrophie Blanche: No Crepitus: No Cyanosis: No SUSEN, RANZ ([id number]) [phone].pdf Page 8 of 10 Excoriation: No Ecchymosis: No Induration: No Erythema: No Rash: No Hemosiderin Staining: Yes Scarring: Yes Mottled: No Pallor: No Moisture Rubor: No No Abnormalities Noted: No Dry / Scaly: Yes Temperature / Pain Maceration: No Temperature: No Abnormality Tenderness on Palpation: Yes Treatment Notes Wound #5 (Lower Leg) Wound Laterality: Right, Lateral Cleanser Soap and Water Discharge Instruction: May shower and wash wound with dial antibacterial soap and water prior to dressing change. Peri-Wound Care Sween Lotion (Moisturizing lotion) Discharge Instruction: Apply moisturizing lotion as directed Topical compounding topical antibiotics Discharge Instruction: apply Keystone directly to wound bed under the hydrofera blue. Home Health start. MIX ACCORDING TO THE PHARMACY INSTRUCTIONS. Primary  Dressing PolyMem Non-Adhesive Dressing, 4x4 in Discharge Instruction: ******CUT SLITS INTO THE POLYMEM.***** Apply to wound bed as instructed Secondary Dressing ABD Pad, 8x10 Discharge Instruction: Apply over primary dressing as directed. Woven Gauze Sponge, Non-Sterile 4x4 in Discharge Instruction: Apply over primary dressing as directed. Zetuvit Plus 4x8 in Discharge Instruction: Or double ABD pad Apply over primary dressing as directed. Secured With Compression Wrap Urgo K2 Lite, (equivalent to a 3 layer) two layer compression system, regular Discharge Instruction: Apply Urgo K2 Lite as directed (alternative to 3 layer compression). unna boot FIRST LAYER **** SEE INSTRUCTIONS Discharge Instruction: APPLY FIRST LAYER UNNA BOOT AT BASES OF TOES AND JUST BELOW THE KNEE TO HOLD COMPRESSION WRAP IN PLACE. Compression Stockings Add-Ons Electronic Signature(s) Signed: 02/19/2023 3:50:36 PM By:  Redmond Pulling RN, BSN Entered By: Redmond Pulling on 02/19/2023 07:47:30 -------------------------------------------------------------------------------- Wound Assessment Details Patient Name: Date of Service: Jamie [name], [name]. 02/19/2023 10:30 A M Medical Record Number: [id number] Patient Account Number: [id number] Date of Birth/Sex: Treating RN: March 30, 1941 (82 y.o. Jamie [name] Primary Care Kathlyne Loud: Belva Agee Other Clinician: Referring Nelani Schmelzle: Treating Mariateresa Batra/Extender: Vivia Budge in Treatment: [address] JAVAEH, THOMPSEN ([id number]) [id number] Page 9 of 10 Wound Number: 7 Primary Venous Leg Ulcer Etiology: Wound Location: Right, Posterior Lower Leg Wound Open Wounding Event: Gradually Appeared Status: Date Acquired: 01/01/2023 Comorbid Sleep Apnea, Hypertension, Peripheral Venous Disease, Weeks Of Treatment: 7 History: Osteoarthritis, Neuropathy Clustered Wound: No Photos Wound  Measurements Length: (cm) 2.9 Width: (cm) 2.7 Depth: (cm) 0.1 Area: (cm) 6.15 Volume: (cm) 0.615 % Reduction in Area: 47.7% % Reduction in Volume: 73.8% Epithelialization: Small (1-33%) Tunneling: No Undermining: No Wound Description Classification: Full Thickness Without Exposed Support Structures Wound Margin: Distinct, outline attached Exudate Amount: Medium Exudate Type: Serosanguineous Exudate Color: red, brown Foul Odor After Cleansing: No Slough/Fibrino Yes Wound Bed Granulation Amount: Large (67-100%) Exposed Structure Granulation Quality: Red, Pink Fascia Exposed: No Necrotic Amount: Small (1-33%) Fat Layer (Subcutaneous Tissue) Exposed: Yes Necrotic Quality: Adherent Slough Tendon Exposed: No Muscle Exposed: No Joint Exposed: No Bone Exposed: No Periwound Skin Texture Texture Color No Abnormalities Noted: No No Abnormalities Noted: No Callus: No Atrophie Blanche: No Crepitus: No Cyanosis: No Excoriation: No Ecchymosis: No Induration: No Erythema: No Rash: No Hemosiderin Staining: Yes Scarring: Yes Mottled: No Pallor: No Moisture Rubor: No No Abnormalities Noted: No Dry / Scaly: No Temperature / Pain Maceration: No Temperature: No Abnormality Tenderness on Palpation: Yes Treatment Notes Wound #7 (Lower Leg) Wound Laterality: Right, Posterior Cleanser Soap and Water Discharge Instruction: May shower and wash wound with dial antibacterial soap and water prior to dressing change. Peri-Wound Care Sween Lotion (Moisturizing lotion) Discharge Instruction: Apply moisturizing lotion as directed Topical compounding topical antibiotics Discharge Instruction: apply Keystone directly to wound bed under the hydrofera blue. Home Health start. MIX ACCORDING TO THE SCHARLENE, HOOKE ([id number]) [id number] Page 10 of 10 PHARMACY INSTRUCTIONS. Primary Dressing PolyMem Non-Adhesive Dressing, 4x4 in Discharge Instruction:  ******CUT SLITS INTO THE POLYMEM.***** Apply to wound bed as instructed Secondary Dressing ABD Pad, 8x10 Discharge Instruction: Apply over primary dressing as directed. Woven Gauze Sponge, Non-Sterile 4x4 in Discharge Instruction: Apply over primary dressing as directed. Zetuvit Plus 4x8 in Discharge Instruction: Or double ABD pad Apply over primary dressing as directed. Secured With Compression Wrap Urgo K2 Lite, (equivalent to a 3 layer) two layer compression system, regular Discharge Instruction: Apply Urgo K2 Lite as directed (alternative to 3 layer compression). unna boot FIRST LAYER **** SEE INSTRUCTIONS Discharge Instruction: APPLY FIRST LAYER UNNA BOOT AT BASES OF TOES AND JUST BELOW THE KNEE TO HOLD COMPRESSION WRAP IN PLACE. Compression Stockings Add-Ons Electronic Signature(s) Signed: 02/19/2023 3:50:36 PM By: Redmond Pulling RN, BSN Entered By: Redmond Pulling on 02/19/2023 07:48:46 -------------------------------------------------------------------------------- Vitals Details Patient Name: Date of Service: Jamie Brink, MA [name] 02/19/2023 10:30 A M Medical Record Number: [id number] Patient Account Number: [id number] Date of Birth/Sex: Treating RN: 1940-10-12 (82 y.o. Jamie [name] Primary Care Shirley Decamp: Belva Agee Other Clinician: Referring Kennan Detter: Treating Rockney Grenz/Extender: Vivia Budge in Treatment: 39 Vital Signs Time Taken: 10:45 Temperature (F): 99.1 Height (in): 62 Pulse (bpm): 52 Weight (lbs): 171 Respiratory Rate (breaths/min): 18 Body Mass Index (BMI): 31.3 Blood Pressure (mmHg): 124/72 Reference Range:  80 - 120 mg / dl Electronic Signature(s) Signed: 02/19/2023 5:11:37 PM By: Karie Schwalbe RN Entered By: Karie Schwalbe on 02/19/2023 07:49:26

## 2023-02-20 NOTE — Progress Notes (Signed)
Jamie [name], UNGERMAN ([id number]) [id number] Page 1 of 12 Visit Report for 02/19/2023 Debridement Details Patient Name: Date of Service: Jamie [name], [name]. 02/19/2023 10:30 A M Medical Record Number: [id number] Patient Account Number: [id number] Date of Birth/Sex: Treating RN: Jul 07, 1940 (82 y.o. Jamie [name] Primary Care Provider: Belva Agee Other Clinician: Referring Provider: Treating Provider/Extender: Vivia Budge in Treatment: 39 Debridement Performed for Assessment: Wound #5 Right,Lateral Lower Leg Performed By: Physician Maxwell Caul., MD The following information was scribed by: Karie Schwalbe The information was scribed for: Jamie [name] Debridement Type: Debridement Severity of Tissue Pre Debridement: Fat layer exposed Level of Consciousness (Pre-procedure): Awake and Alert Pre-procedure Verification/Time Out Yes - 11:18 Taken: Start Time: 11:18 Pain Control: Lidocaine 4% T opical Solution Percent of Wound Bed Debrided: 100% T Area Debrided (cm): otal 1.51 Tissue and other material debrided: Viable, Non-Viable, Callus, Eschar, Subcutaneous, Skin: Dermis , Skin: Epidermis Level: Skin/Subcutaneous Tissue Debridement Description: Excisional Instrument: Curette Bleeding: Minimum Hemostasis Achieved: Pressure Response to Treatment: Procedure was tolerated well Level of Consciousness (Post- Awake and Alert procedure): Post Debridement Measurements of Total Wound Length: (cm) 2.4 Width: (cm) 0.8 Depth: (cm) 0.1 Volume: (cm) 0.151 Character of Wound/Ulcer Post Debridement: Improved Severity of Tissue Post Debridement: Fat layer exposed Post Procedure Diagnosis Same as Pre-procedure Electronic Signature(s) Signed: 02/19/2023 5:11:37 PM By: Karie Schwalbe RN Signed: 02/20/2023 4:06:32 PM By: Jamie Najjar MD Entered By: Karie Schwalbe on 02/19/2023  08:25:54 -------------------------------------------------------------------------------- Debridement Details Patient Name: Date of Service: Jamie [name], [name] 02/19/2023 10:30 A M Medical Record Number: [id number] Patient Account Number: [id number] Date of Birth/Sex: Treating RN: 29-Nov-1940 (82 y.o. Jamie [name] Primary Care Provider: Belva Agee Other Clinician: Referring Provider: Treating Provider/Extender: Vivia Budge in Treatment: 39 Debridement Performed for Assessment: Wound #7 Right,Posterior Lower Leg Performed By: Physician Maxwell Caul., MD [name] ([id number]) [id number] Page 2 of 12 The following information was scribed by: Karie Schwalbe The information was scribed for: Jamie [name] Debridement Type: Debridement Severity of Tissue Pre Debridement: Fat layer exposed Level of Consciousness (Pre-procedure): Awake and Alert Pre-procedure Verification/Time Out Yes - 11:18 Taken: Start Time: 11:18 Pain Control: Lidocaine 4% T opical Solution Percent of Wound Bed Debrided: 100% T Area Debrided (cm): otal 6.15 Tissue and other material debrided: Viable, Non-Viable, Callus, Eschar, Subcutaneous, Skin: Dermis , Skin: Epidermis Level: Skin/Subcutaneous Tissue Debridement Description: Excisional Instrument: Curette Bleeding: Minimum Hemostasis Achieved: Pressure Response to Treatment: Procedure was tolerated well Level of Consciousness (Post- Awake and Alert procedure): Post Debridement Measurements of Total Wound Length: (cm) 2.9 Width: (cm) 2.7 Depth: (cm) 0.1 Volume: (cm) 0.615 Character of Wound/Ulcer Post Debridement: Improved Severity of Tissue Post Debridement: Fat layer exposed Post Procedure Diagnosis Same as Pre-procedure Electronic Signature(s) Signed: 02/19/2023 5:11:37 PM By: Karie Schwalbe RN Signed: 02/20/2023 4:06:32 PM By: Jamie Najjar MD Entered By:  Karie Schwalbe on 02/19/2023 08:28:48 -------------------------------------------------------------------------------- HPI Details Patient Name: Date of Service: Jamie Brink, [place] [name] 02/19/2023 10:30 A M Medical Record Number: [id number] Patient Account Number: [id number] Date of Birth/Sex: Treating RN: Jan 22, 1941 (82 y.o. F) Primary Care Provider: Belva Agee Other Clinician: Referring Provider: Treating Provider/Extender: Vivia Budge in Treatment: 39 History of Present Illness HPI Description: ADMISSION 06/30/2020 Mrs. Frye is an 82 year old woman who lives in [place]. She is here with her niece for review of wounds on the left medial lower leg and ankle. These have apparently been present for over a year  and she followed with Dr. Olegario Messier at the [facility] in [place] for quite a period of time although it looks as though there was a initial consult wound from Dr. Marcha Solders on February 18 presumably there was therefore hiatus. At that point the wounds were described as being there for 3 months. She also tells me she was at the wound care center in [place] for a period of time with this. 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 She had venous studies that were negative for DVT ABIs on the right were 1.01 on the left 1.06. She has . had previous applications of puraply, compression which she does not tolerate very well. She has had several rounds of oral antibiotic therapy. She complains of unrelenting pain and she is seeing Dr. Reece Agar V of pain management apparently was on oxycodone but that did not help. She also had a skin biopsy done by Dr. Olegario Messier although we do not have that result. She does not appear to have an arterial issue. I am not completely clear what she has been putting on the wounds lately. 3/31; this patient has a particularly nasty set of wounds on the left medial ankle in the  middle of what looks to be hemosiderin deposition secondary to chronic venous insufficiency. She has a lot of pain followed by pain management. Dr. Marcha Solders apparently did a biopsy of something on the left leg last fall what I would like to get this result. She has a history of MRSA treatment. I do not believe she had reflux studies but she did have DVT rule out studies. Previous ABIs have not suggested arterial insufficiency 4/7; difficult wounds on the left medial ankle probably chronic venous insufficiency. With considerable effort on behalf of our case manager we were able to finally to speak to somebody at the hospital in [place] who indicated that no biopsy of this area have been done even though the patient describes this in some EABHA, ORBE ([id number]) [id number] Page 3 of 12 detail and is even able to point out where she thinks the biopsy was done. We have been using Sorbact. The PCR culture I did showed polymicrobial identification with Pseudomonas, staph aureus, Peptostreptococcus. All of this and low titers. Resistance genes identified were MRSA, staph virulence gene and tetracycline. We are going to send this to [facility] for a topical antibiotic which is something that we have had good success with recently in large venous ulcers with a lot of purulent drainage. There would not be an easy oral alternative here possibly line escalated and ciprofloxacin if we need to use systemic antibiotic 4/15; difficult area on the left medial ankle. Most likely chronic venous insufficiency. I think she will probably need venous reflux study I think she had DVT rule outs but not venous reflux studies. We have not yet obtained the topical antibiotics. She has home health changing the dressing we have been using Sorbact for adherent fibrinous debris on the surface. Very difficult to remove 4/22; patient presents for 1 week follow-up. She has been using sore back under  compression wraps and these are changed 3 times a week with home health. She also had Keystone antibiotics sent to her house and brought them in today. She has no complaints or issues today. 5/2; patient is here for follow-up. She has been using Sorbact under compression. Very painful wound. She has been using Keystone antibiotics. Not much improvement although the more medial part of the  wound has cleaned up nicely and the larger part of the wound about 50% slough covered. Part of the issue here is that she had stays at both [facility] wound care center, [facility] wound care center and now [place]. Not sure if she has had venous reflux studies. As far as we are able to tell she did not have a biopsy. My notes state that she did not have venous reflux studies just DVT rule outs. 5/16; patient goes for venous reflux studies this afternoon. She says that wound was biopsied which sounds like punch biopsies by Dr. Lynden Ang we do not have these results. We are using Sorbact. Very difficult wound to debride 5/23; patient presents for 1 week follow-up. She reports tolerating the wraps well with sorbact underneath. She had ABIs and venous reflux studies done. She has no issues or complaints today. She denies acute signs of infection. 6/6; I have reviewed the patient's vascular studies. Wounds are on the left medial and posterior calf. She had significant reflux in the greater saphenous vein in the in the mid thigh, distal thigh knee and the small saphenous vein in the popliteal fossa. The vein diameters do not look too impressive though. She is going to see the vascular surgeon on Wednesday. She also had venous reflux in the right common femoral vein. She did not have any evidence of a DVT or SVT I am wondering whether there is an ablation procedure that would benefit her in the greater saphenous vein on the left. . She tells [place] that home health put the dressing on too tight and she took off 1 layer. The swelling in her  left leg is a little worse as a result of this. Not much change in the wound measurements so the surface of the wound looks better Her arterial studies showed an ABI on the right of 1.14 with a triphasic waveform and a great toe pressure of 0.89. On the left her ABIs were noncompressible at 1.34 but with triphasic waveforms and a TBI of 0.97. Her greater toe pressure was 122. There was no evidence of significant bilateral arterial disease 6/20 patient went to see Dr. Durwin Nora. He did not think she had significant arterial disease. In terms of her venous duplex on the right side there was no evidence of a DVT or SVT there was deep venous reflux involving the common femoral vein no superficial vein reflux on the left side there was no DVT or SVT there was no deep vein graft reflux there was some reflux in the greater saphenous vein from the mid thigh to the knee but the vein here was not dilated. He thought these were venous wounds he prescribed a compression pump but I am not sure who we ordered it from The patient has been approved for Apligraf. Still using silver collagen this week 7/5; Apligraf #1 7/19 Apligraf #2. Decent improvement in the condition of the wound bed. Epithelialization distal 8/2 Apligraf #3. No issues or complaints. Denies signs of infection. 8/17 Apligraf #4. No issues or concerns. Some complaints of pruritus and the rash. Our intake nurse brought up the fact that she had previously indicated possible cotton layer sensitivity we will therefore use kerlix in the bottom layer of the compression 8/30; the patient comes in with the area on her left medial leg just about healed. There is a superficial area more towards the tibia and a smaller open area distally everything else is epithelialized. I do not think she requires another Apligraf 9/13;  left medial leg is healed. 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 Her edema  control is good Readmisstion: 05-22-2022 upon evaluation today patient presents for initial inspection here in our clinic concerning issues that she has been having with a wound of the right lateral lower extremity. This is an area of a previous skin graft she tells me. With that being said she unfortunately had a scrape on this that occurred around 10 January. Since that time she has noted that this has just continued to get bigger and bigger in her niece who is present with her today actually states that 2 weeks ago when she saw that it was significantly smaller than what she sees currently. Obviously this is of utmost concern as we do not want this to continue to get larger when arrested and get moving in the right direction. Fortunately there does not appear to be any signs of systemic infection though locally I think we probably do have some infection present. I would obtain a culture to see what we have going on here and then we will subsequently see where things go going forward. Fortunately I think that she is in the right place at this point being at the wound center we can definitely do something to try to get this moving in the right direction. Patient does have a history of chronic venous insufficiency as well as coronary artery disease. In the past she has done well with compression wraps on the start with a 3 layer wrap I think she is probably can end up going to a 4-layer at some point but we will see how things do over the next week. She has been using wound cleanser along with she tells me a zinc and topical but again I am not sure exactly what that was. 05-29-2022 upon evaluation today patient appears to be doing well currently in regard to her wound. This actually is showing signs of improvement I am happy in that regard unfortunately her left leg has an area on the shin that has opened. This is the region where one of the areas at least we have previously taken care of. Nonetheless  this is small hoping we get it under control before things worsen significantly. 06-05-2022 upon evaluation today patient appears to be doing well currently in regard to her wounds. The actually seem to be fairly clean she is having still quite a bit of problem with pain at this point she would like to not have debridement today for all possible. With that being said I do believe that we are making some good progress I think the [facility] is doing a decent job here. 3/6; patient has had 3/6; patient with known severe chronic venous insufficiency. She apparently had a traumatic wound at home and has a reasonably substantial wound on the right lateral lower leg and a smaller one on the left anterior lower leg as well. We have been using Hydrofera Blue and Unna boots 3/13; patient presents for follow-up. We have been using Hydrofera Blue under Coflex to the legs bilaterally. She has no issues or complaints today. 06-26-2022 upon evaluation today patient appears to be doing well currently in regard to her wound. This is measuring a little bit larger however. Fortunately I do not see any signs of infection this is on the right side. Fortunately the left side is actually completely healed which is great news. 07-03-2022 patient's wound unfortunately is continuing  to show signs of being worse. Her compression which was the Tubigrip that I thought should be able to pull up actually slipped down and she was not able to pull that back up. With that being said that means that unfortunately her wound has continued to deteriorate since I last saw her. This is definitely not the direction that we are looking for. I discussed with her that I do believe we need to go ahead and see about getting her started on antibiotics and I subsequently would also like to go ahead and see about getting things moving forward with regard to a wound culture and making a change up her dressings as well but this can be changed more  frequently than just once a week. 07-10-2022 upon evaluation today patient actually appears to be doing much better. I do believe that the antibiotics have been beneficial for her. Fortunately I do not see any signs of active infection locally nor systemically which is great news. No fevers, chills, nausea, vomiting, or diarrhea. 07-17-2022 upon evaluation today patient appears to be doing well currently in regard to her wound which is actually showing signs of improvement this is slow but nonetheless prevalent that we are seeing good improvement here. Fortunately I do not see any signs of active infection locally nor systemically which is great news. 07-24-2022 upon evaluation today patient appears to be doing well currently in regard to her wound although the wrap that we had on has been slipping down and ABRIONNA, SWEM ([id number]) [id number] Page 4 of 12 she really does not have anybody to help her with this at home health is not going to be coming out we could not get anybody that would actually be able to do the dressing changes. Fortunately I do not see any signs of active infection locally nor systemically which is great news. 08-07-22 poorly in regard to her leg compared to what it was previous. Fortunately there does not appear to be any signs of active infection locally nor systemically which is great news. No fevers, chills, nausea, vomiting, or diarrhea. With that being said it does appear that the patient may have some cellulitis in the leg which is not good. 08-14-2022 upon evaluation today patient appears to be doing somewhat better in regard to her wounds she still is having quite a bit of pain we are still not where we want to be as far as healing is concerned completely. Fortunately I do not see any evidence of active infection locally nor systemically which is great news and I am very pleased in that regard. 08-21-23 upon evaluation today patient appears  to be doing poorly currently in regard to her wound. She has been tolerating the dressing changes without complication. Fortunately there does not appear to be any signs of active infection locally nor systemically at this time. 08-28-2022 upon evaluation today patient appears to be doing poorly in regard to her wound this was not wrapped appropriately home health did not go up high enough on the wrap. This has caused some issues and I discussed that with the patient today. I do believe that she is going to require aggressive wrapping and treatment which she is getting the [facility] topical antibiotics today they can start using that at the next wrap on Friday. In the meantime they need to make sure that the rapid and appropriately we wrote very specific orders today. 09-04-2022 upon evaluation today patient appears to be doing well currently in regard to her  wound which I think is making progress is still hurting her quite significantly. Fortunately I do not see any signs of active infection locally or systemically which is great news. No fevers, chills, nausea, vomiting, or diarrhea. 09-11-2022 upon evaluation today patient's wound actually is showing signs of being a little bit smaller and looking a little bit better she still has a lot going on here however. Fortunately I do not see any evidence of active infection Worsening locally nor systemically which is great news. With that being said worsening still continue to use the topical Keystone antibiotics. 09-18-2022 upon evaluation today patient actually showing some signs of improvement. I am actually very pleased with where we stand compared to where we have been. I think that she is making good headway here. She is still having quite a bit of pain but it seems to be lessening compared to previous. 09-25-2022 upon evaluation patient's wound actually showing signs slowly but surely of improvement. Fortunately I do not see any signs of active infection  at this time systemically and locally I feel like this is dramatically improved. She is doing well with the [facility] topical antibiotics. 10-02-2022 upon evaluation today patient appears to be doing better in regard to her wound overall. I am very pleased with where things stand from a visual standpoint I do not see any signs of active infection and in general I think that we are moving in the right direction here. 10-09-2022 upon evaluation today patient appears to be doing well currently in regard to her wound. She has been tolerating the dressing changes without complication. Fortunately I do not see any signs of active infection at this time which is great news. 10-16-2022 upon evaluation today patient continues to have quite a bit of pain in regard to her right leg. We have been attempting to debride little by little as we could but again was pretty limited by the fact that she is having significant discomfort. We do not actually have any signs of active infection going on at this point that the [facility] topical antibiotics have been helping quite readily. I been attempting to do as much debridement as I can but if she were to have pain medication this would actually help and in the past when she did it was much more tolerable for her as far as taking the edge off. Right now she has been without as she is in transition from her previous pain management physician to someone new to manage this. 10-23-2022 upon evaluation patient appears to be doing excellent in regard to her wound. She has been tolerating the dressing changes without complication. Fortunately I do not see any evidence of active infection locally nor systemically which is great news. No fevers, chills, nausea, vomiting, or diarrhea. 7/24; patient with a wound secondary to chronic venous insufficiency on the right lateral lower leg. We have been using Keystone antibiotic Hydrofera Blue under kerlix Coban. She complains of a lot of pain  after last week's debridement otherwise things appear to be improved per discussion with our intake nurse. 11-06-2022 upon evaluation today patient appears to be doing excellent at this point in regard to her wound. She has been tolerating the dressing changes without complication and to be honest she is making excellent progress towards complete closure. I am actually very pleased with where we stand I think that she is doing excellent as far as the overall appearance of the wound is concerned as well. She is going require some debridement  however. 11-20-2022 upon evaluation today patient appears to be doing well currently in regard to her wound. She has been tolerating the dressing changes without complication. Fortunately there does not appear to be any signs of active infection locally or systemically which is great news and in general I do believe that we are moving in the right direction here. No fevers, chills, nausea, vomiting, or diarrhea. 11-27-2022 upon evaluation today patient appears to be doing a little better in regards to the overall appearance of her wound but unfortunately she is having increased pain. The collagen seems to be getting very dry and stuck to the wound bed which is causing her some issues here. Fortunately I do not see any signs of active infection locally nor systemically at this time. Fortunately I think that her wound is better but unfortunately her pain has not. For that reason I am going to avoid any debridement today since the patient is very upset about the pain and how bad this is hurting at this point. I think we can have to try something a little bit different I am thinking PolyMem may be a good option. 8/28; this patient has difficult venous wounds on the right lateral lower leg and ankle in the setting of chronic venous insufficiency. We have been using polymen under compression with kerlix Coban. 12-11-2022 upon evaluation today patient appears to be doing well  currently in regard to her wound. She has been tolerating the dressing changes without complication and actually appears to be doing much better this week compared to when I saw her 2 weeks ago. The wound is measuring significantly smaller. We are using PolyMem along with Kerlix and Coban. 12-18-2022 upon evaluation today patient appears to be doing well currently in regard to her wound. This is actually showing signs of improvement is measuring a little bit smaller and looking better. Fortunately I do not see any evidence of worsening overall and I believe that we will making good headway with the PolyMem and the [facility] topical antibiotics. 12-25-2022 upon evaluation today patient appears to be doing well currently in regard to her wound. She has been tolerating the dressing changes without complication. Fortunately I do not see any evidence of infection locally or systemically which is great news and in general I do believe that we will make an excellent headway towards complete closure also excellent news. 01-01-2023 upon evaluation patient's wound actually showing signs of improvement the wrap was actually put on properly this week and this is good news. Overall I am extremely happy with where things stand and how this appears today I do not see any signs of worsening overall and I believe that the patient is making excellent headway towards complete closure which is great news. 01/08/2023 upon evaluation today patient appears to be doing well currently in regard to her leg. She is actually draining much less unfortunately home health is just not doing very well getting the supplies necessary in order to continue to take care of the patient. They are now telling me they cannot get the PolyMem which is something that has been doing really well for her. That really does not make sense to me you came to get PolyMem for a hospice patient. Nonetheless either way I think we may need to just discontinue  treatment with home health and just manage the patient here at the clinic. 01-15-2023 upon evaluation today patient appears to be doing well currently in regard to her wound. She has been tolerating the  dressing changes without complication. Fortunately I do not see any signs of infection I think she is doing quite well and very pleased with where we stand today. No fevers, chills, nausea, vomiting, or diarrhea. FOSTER, STOGSDILL ([id number]) [id number] Page 5 of 12 01-22-2023 upon evaluation today patient appears to be doing well currently in regard to her wound. She has been tolerating the dressing changes without complication and both wounds are measuring smaller today and look to be doing excellent. I am actually very pleased with what ever standing here and I think that she is making really good headway towards closure which is great news. 01-29-2023 upon evaluation today patient actually tells me she has been having some increased pain. Subsequently it took a little bit of drilling down but in the end I realized that we actually ended up putting a full Unna boot on her last week as opposed to just using a good anchor at the top and bottom. I think this is what wound up with her having increased discomfort because she tells me as she was doing well when she came in last time and by the time she left she tells me this was already getting significantly worse. And she hurt most of the week. With that being said I do believe that the patient is doing quite well at this point with regard to her wounds and the wrap did okay it was not a problem other than the increased pain I think we may benefit from a Urgo K2 light compression wrap as this will be much more comfortable I think compared to what we have been doing with just the Kerlix Coban anyway. 02-05-2023 upon evaluation today patient appears to be doing well currently in regard to her wounds. She has been tolerating  the dressing changes without complication. Fortunately I do not see any signs of active infection looking systemically which is great news. No fevers, chills, nausea, vomiting, or diarrhea. 11/6; patient I remember from a previous stay in this clinic With a left lower extremity wound.. She has a right lower extremity venous wound. She has been using Keystone, polymen under [employer]. Her wound now is to remaining open areas. There is no evidence of infection although she is tender in the area. I wonder whether there is stasis dermatitis here. She reminds me that she had some form of skin graft on this area during his stay at the [facility] wound care center presumably in around 2022. 11/13; difficult wound on the right lateral lower extremity. She has a history of venous wounds in this area and a history of a skin graft in this area. She can planes about a lot of pain but she has not been systemically unwell. No fever chills etc. Electronic Signature(s) Signed: 02/20/2023 4:06:32 PM By: Jamie Najjar MD Entered By: Jamie [name] on 02/19/2023 09:03:02 -------------------------------------------------------------------------------- Physical Exam Details Patient Name: Date of Service: Jamie [name], [name] 02/19/2023 10:30 A M Medical Record Number: [id number] Patient Account Number: [id number] Date of Birth/Sex: Treating RN: 1940/12/07 (82 y.o. F) Primary Care Provider: Belva Agee Other Clinician: Referring Provider: Treating Provider/Extender: Vivia Budge in Treatment: 39 Constitutional Sitting or standing Blood Pressure is within target range for patient.. Pulse regular and within target range for patient.Marland Kitchen Respirations regular, non-labored and within target range.. Temperature is normal and within the target range for the patient.Marland Kitchen Appears in no distress. Notes Wound exam; 2 open areas that were originally part of the large open  wound. The wounds are  superficial not a very viable surface today. I used a #5 curette to debride as much of this as I could including surrounding callus skin and subcutaneous debris. She does not tolerate debridement of the inferior wound very well Electronic Signature(s) Signed: 02/20/2023 4:06:32 PM By: Jamie Najjar MD Entered By: Jamie [name] on 02/19/2023 09:04:43 -------------------------------------------------------------------------------- Physician Orders Details Patient Name: Date of Service: Jamie [name], [name] 02/19/2023 10:30 A M Medical Record Number: [id number] Patient Account Number: [id number] Date of Birth/Sex: Treating RN: 07/04/1940 (82 y.o. Jamie [name] Primary Care Provider: Belva Agee Other Clinician: Referring Provider: Treating Provider/Extender: Vivia Budge in Treatment: 74 Verbal / Phone Orders: No Diagnosis Coding Follow-up Appointments ARRIANA, ZAMUDIO ([id number]) [phone].pdf Page 6 of 12 ppointment in 1 week. - Dr. Leanord Hawking Wednesday (already scheduled) 02/26/2023 10:30am Return A ppointment in 2 weeks. - Dr. Leanord Hawking Wednesday Return A Nurse Visit: - 03/05/2023 (already scheduled) 1030 ****change Wound Care encounter to nurse visit***** Anesthetic Wound #5 Right,Lateral Lower Leg (In clinic) Topical Lidocaine 4% applied to wound bed Bathing/ Shower/ Hygiene May shower and wash wound with soap and water. - with dressing changes. Edema Control - Orders / Instructions Elevate legs to the level of the heart or above for 30 minutes daily and/or when sitting for 3-4 times a day throughout the day. Avoid standing for long periods of time. Exercise regularly - As tolerated Wound Treatment Wound #5 - Lower Leg Wound Laterality: Right, Lateral Cleanser: Soap and Water 1 x Per Week/30 Days Discharge Instructions: May shower and wash wound with dial antibacterial soap and water prior to dressing  change. Peri-Wound Care: Sween Lotion (Moisturizing lotion) 1 x Per Week/30 Days Discharge Instructions: Apply moisturizing lotion as directed Topical: compounding topical antibiotics 1 x Per Week/30 Days Discharge Instructions: apply Keystone directly to wound bed under the hydrofera blue. Home Health start. MIX ACCORDING TO THE PHARMACY INSTRUCTIONS. Prim Dressing: PolyMem Non-Adhesive Dressing, 4x4 in 1 x Per Week/30 Days ary Discharge Instructions: ******CUT SLITS INTO THE POLYMEM.***** Apply to wound bed as instructed Secondary Dressing: ABD Pad, 8x10 1 x Per Week/30 Days Discharge Instructions: Apply over primary dressing as directed. Secondary Dressing: Woven Gauze Sponge, Non-Sterile 4x4 in 1 x Per Week/30 Days Discharge Instructions: Apply over primary dressing as directed. Secondary Dressing: Zetuvit Plus 4x8 in 1 x Per Week/30 Days Discharge Instructions: Or double ABD pad Apply over primary dressing as directed. Compression Wrap: Kerlix Roll 4.5x3.1 (in/yd) 1 x Per Week/30 Days Discharge Instructions: Apply Kerlix and Coban compression as directed. Compression Wrap: Coban Self-Adherent Wrap 4x5 (in/yd) 1 x Per Week/30 Days Discharge Instructions: Apply over Kerlix as directed. Compression Wrap: unna boot FIRST LAYER **** SEE INSTRUCTIONS 1 x Per Week/30 Days Discharge Instructions: APPLY FIRST LAYER UNNA BOOT AT BASES OF TOES AND JUST BELOW THE KNEE TO HOLD COMPRESSION WRAP IN PLACE. Wound #7 - Lower Leg Wound Laterality: Right, Posterior Cleanser: Soap and Water 1 x Per Week/30 Days Discharge Instructions: May shower and wash wound with dial antibacterial soap and water prior to dressing change. Peri-Wound Care: Sween Lotion (Moisturizing lotion) 1 x Per Week/30 Days Discharge Instructions: Apply moisturizing lotion as directed Topical: compounding topical antibiotics 1 x Per Week/30 Days Discharge Instructions: apply Keystone directly to wound bed under the hydrofera blue.  Home Health start. MIX ACCORDING TO THE PHARMACY INSTRUCTIONS. Prim Dressing: PolyMem Non-Adhesive Dressing, 4x4 in 1 x Per Week/30 Days ary Discharge Instructions: ******CUT SLITS INTO THE POLYMEM.*****  Apply to wound bed as instructed Secondary Dressing: ABD Pad, 8x10 1 x Per Week/30 Days Discharge Instructions: Apply over primary dressing as directed. Secondary Dressing: Woven Gauze Sponge, Non-Sterile 4x4 in 1 x Per Week/30 Days Discharge Instructions: Apply over primary dressing as directed. Secondary Dressing: Zetuvit Plus 4x8 in 1 x Per Week/30 Days Discharge Instructions: Or double ABD pad Apply over primary dressing as directed. Compression Wrap: Kerlix Roll 4.5x3.1 (in/yd) 1 x Per Week/30 Days Discharge Instructions: Apply Kerlix and Coban compression as directed. Compression Wrap: Coban Self-Adherent Wrap 4x5 (in/yd) 1 x Per Week/30 Days Discharge Instructions: Apply over Kerlix as directed. JASSICA, CULKIN ([id number]) [id number] Page 7 of 12 Compression Wrap: unna boot FIRST LAYER **** SEE INSTRUCTIONS 1 x Per Week/30 Days Discharge Instructions: APPLY FIRST LAYER UNNA BOOT AT BASES OF TOES AND JUST BELOW THE KNEE TO HOLD COMPRESSION WRAP IN PLACE. Electronic Signature(s) Signed: 02/19/2023 5:11:37 PM By: Karie Schwalbe RN Signed: 02/20/2023 4:06:32 PM By: Jamie Najjar MD Entered By: Karie Schwalbe on 02/19/2023 08:29:58 -------------------------------------------------------------------------------- Problem List Details Patient Name: Date of Service: Jamie [name], [name] 02/19/2023 10:30 A M Medical Record Number: [id number] Patient Account Number: [id number] Date of Birth/Sex: Treating RN: [birth year] (82 y.o. 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 02/19/2023 10:30 A M Medical Record Number: [id number] Patient Account Number: [id number] Date of Birth/Sex: Treating RN: 08-08-1940 (82 y.o. F) Primary Care Provider: Belva Agee Other Clinician: Referring Provider: Treating Provider/Extender: Vivia Budge in Treatment: [address], [place] W ([id number]) [id number] Page 8 of 12 Subjective History of Present Illness (HPI) ADMISSION 06/30/2020 Mrs. Angley is an 82 year old woman who lives in [place]. She is here with her niece for review of wounds on the left medial lower leg and ankle. These have apparently been present for over a year and she followed with Dr. Olegario Messier at the [facility] in [place] for quite a period of time although it looks as though there was a initial consult wound from Dr. Marcha Solders on February 18 presumably there was therefore hiatus. At that point the wounds were described as being there for 3 months. She also tells me she was at the wound care center in [place] for a period of time with this. 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 She had venous studies that were negative for DVT ABIs on the right were 1.01 on the left 1.06.  She has . had previous applications of puraply, compression which she does not tolerate very well. She has had several rounds of oral antibiotic therapy. She complains of unrelenting pain and she is seeing Dr. Reece Agar V of pain management apparently was on oxycodone but that did not help. She also had a skin biopsy done by Dr. Olegario Messier although we do not have that result. She does not appear to have an  arterial issue. I am not completely clear what she has been putting on the wounds lately. 3/31; this patient has a particularly nasty set of wounds on the left medial ankle in the middle of what looks to be hemosiderin deposition secondary to chronic venous insufficiency. She has a lot of pain followed by pain management. Dr. Marcha Solders apparently did a biopsy of something on the left leg last fall what I would like to get this result. She has a history of MRSA treatment. I do not believe she had reflux studies but she did have DVT rule out studies. Previous ABIs have not suggested arterial insufficiency 4/7; difficult wounds on the left medial ankle probably chronic venous insufficiency. With considerable effort on behalf of our case manager we were able to finally to speak to somebody at the hospital in [place] who indicated that no biopsy of this area have been done even though the patient describes this in some detail and is even able to point out where she thinks the biopsy was done. We have been using Sorbact. The PCR culture I did showed polymicrobial identification with Pseudomonas, staph aureus, Peptostreptococcus. All of this and low titers. Resistance genes identified were MRSA, staph virulence gene and tetracycline. We are going to send this to [facility] for a topical antibiotic which is something that we have had good success with recently in large venous ulcers with a lot of purulent drainage. There would not be an easy oral alternative here possibly line escalated and ciprofloxacin if we need to use  systemic antibiotic 4/15; difficult area on the left medial ankle. Most likely chronic venous insufficiency. I think she will probably need venous reflux study I think she had DVT rule outs but not venous reflux studies. We have not yet obtained the topical antibiotics. She has home health changing the dressing we have been using Sorbact for adherent fibrinous debris on the surface. Very difficult to remove 4/22; patient presents for 1 week follow-up. She has been using sore back under compression wraps and these are changed 3 times a week with home health. She also had Keystone antibiotics sent to her house and brought them in today. She has no complaints or issues today. 5/2; patient is here for follow-up. She has been using Sorbact under compression. Very painful wound. She has been using Keystone antibiotics. Not much improvement although the more medial part of the wound has cleaned up nicely and the larger part of the wound about 50% slough covered. Part of the issue here is that she had stays at both [facility] wound care center, [facility] wound care center and now [place]. Not sure if she has had venous reflux studies. As far as we are able to tell she did not have a biopsy. My notes state that she did not have venous reflux studies just DVT rule outs. 5/16; patient goes for venous reflux studies this afternoon. She says that wound was biopsied which sounds like punch biopsies by Dr. Lynden Ang we do not have these results. We are using Sorbact. Very difficult wound to debride 5/23; patient presents for 1 week follow-up. She reports tolerating the wraps well with sorbact underneath. She had ABIs and venous reflux studies done. She has no issues or complaints today. She denies acute signs of infection. 6/6; I have reviewed the patient's vascular studies. Wounds are on the left medial and posterior calf. She had significant reflux in the greater saphenous vein in the in the mid thigh, distal thigh knee and  the small saphenous vein in the popliteal fossa. The vein diameters do not look too impressive though. She is going to see the vascular surgeon on Wednesday. She also had venous reflux in the right common femoral vein. She did not have any evidence of a DVT or SVT I am wondering whether there is an ablation procedure that would benefit her in the greater saphenous vein on the left. . She tells [place] that home health put the dressing on too tight and she took off 1 layer. The swelling in her left leg is a little worse as a result of this. Not much change in the wound measurements so the surface of the wound looks better Her arterial studies showed an ABI on the right of 1.14 with a triphasic waveform and a great toe pressure of 0.89. On the left her ABIs were noncompressible at 1.34 but with triphasic waveforms and a TBI of 0.97. Her greater toe pressure was 122. There was no evidence of significant bilateral arterial disease 6/20 patient went to see Dr. Durwin Nora. He did not think she had significant arterial disease. In terms of her venous duplex on the right side there was no evidence of a DVT or SVT there was deep venous reflux involving the common femoral vein no superficial vein reflux on the left side there was no DVT or SVT there was no deep vein graft reflux there was some reflux in the greater saphenous vein from the mid thigh to the knee but the vein here was not dilated. He thought these were venous wounds he prescribed a compression pump but I am not sure who we ordered it from The patient has been approved for Apligraf. Still using silver collagen this week 7/5; Apligraf #1 7/19 Apligraf #2. Decent improvement in the condition of the wound bed. Epithelialization distal 8/2 Apligraf #3. No issues or complaints. Denies signs of infection. 8/17 Apligraf #4. No issues or concerns. Some complaints of pruritus and the rash. Our intake nurse brought up the fact that she had previously  indicated possible cotton layer sensitivity we will therefore use kerlix in the bottom layer of the compression 8/30; the patient comes in with the area on her left medial leg just about healed. There is a superficial area more towards the tibia and a smaller open area distally everything else is epithelialized. I do not think she requires another Apligraf 9/13; left medial leg is healed. 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 Her edema control is good Readmisstion: 05-22-2022 upon evaluation today patient presents for initial inspection here in our clinic concerning issues that she has been having with a wound of the right lateral lower extremity. This is an area of a previous skin graft she tells me. With that being said she unfortunately had a scrape on this that occurred around 10 January. Since that time she has noted that this has just continued to get bigger and bigger in her niece who is present with her today actually states that 2 weeks ago when she saw that it was significantly smaller than what she sees currently. Obviously this is of utmost concern as we do not want this to continue to get larger when arrested and get moving in the right direction. Fortunately there does not appear to be any signs of systemic infection though locally I think we probably do have some infection present. I would obtain a culture to see what we have going  on here and then we will subsequently see where things go going forward. Fortunately I think that she is in the right place at this point being at the wound center we can definitely do something to try to get this moving in the right direction. Patient does have a history of chronic venous insufficiency as well as coronary artery disease. In the past she has done well with compression wraps on the start with a 3 layer wrap I think she is probably can end up going to a 4-layer at some point but we  will see how things do over the next week. She has been using wound cleanser along with she tells me a zinc and topical but again I am not sure exactly what that was. 05-29-2022 upon evaluation today patient appears to be doing well currently in regard to her wound. This actually is showing signs of improvement I am happy in that regard unfortunately her left leg has an area on the shin that has opened. This is the region where one of the areas at least we have previously taken care of. Nonetheless this is small hoping we get it under control before things worsen significantly. 06-05-2022 upon evaluation today patient appears to be doing well currently in regard to her wounds. The actually seem to be fairly clean she is having still quite ARRILLA, ARMATO ([id number]) [id number] Page 9 of 12 a bit of problem with pain at this point she would like to not have debridement today for all possible. With that being said I do believe that we are making some good progress I think the [facility] is doing a decent job here. 3/6; patient has had 3/6; patient with known severe chronic venous insufficiency. She apparently had a traumatic wound at home and has a reasonably substantial wound on the right lateral lower leg and a smaller one on the left anterior lower leg as well. We have been using Hydrofera Blue and Unna boots 3/13; patient presents for follow-up. We have been using Hydrofera Blue under [employer] to the legs bilaterally. She has no issues or complaints today. 06-26-2022 upon evaluation today patient appears to be doing well currently in regard to her wound. This is measuring a little bit larger however. Fortunately I do not see any signs of infection this is on the right side. Fortunately the left side is actually completely healed which is great news. 07-03-2022 patient's wound unfortunately is continuing to show signs of being worse. Her compression which was the  Tubigrip that I thought should be able to pull up actually slipped down and she was not able to pull that back up. With that being said that means that unfortunately her wound has continued to deteriorate since I last saw her. This is definitely not the direction that we are looking for. I discussed with her that I do believe we need to go ahead and see about getting her started on antibiotics and I subsequently would also like to go ahead and see about getting things moving forward with regard to a wound culture and making a change up her dressings as well but this can be changed more frequently than just once a week. 07-10-2022 upon evaluation today patient actually appears to be doing much better. I do believe that the antibiotics have been beneficial for her. Fortunately I do not see any signs of active infection locally nor systemically which is great news. No fevers, chills, nausea, vomiting, or diarrhea. 07-17-2022 upon evaluation today  patient appears to be doing well currently in regard to her wound which is actually showing signs of improvement this is slow but nonetheless prevalent that we are seeing good improvement here. Fortunately I do not see any signs of active infection locally nor systemically which is great news. 07-24-2022 upon evaluation today patient appears to be doing well currently in regard to her wound although the wrap that we had on has been slipping down and she really does not have anybody to help her with this at home health is not going to be coming out we could not get anybody that would actually be able to do the dressing changes. Fortunately I do not see any signs of active infection locally nor systemically which is great news. 08-07-22 poorly in regard to her leg compared to what it was previous. Fortunately there does not appear to be any signs of active infection locally nor systemically which is great news. No fevers, chills, nausea, vomiting, or diarrhea. With that  being said it does appear that the patient may have some cellulitis in the leg which is not good. 08-14-2022 upon evaluation today patient appears to be doing somewhat better in regard to her wounds she still is having quite a bit of pain we are still not where we want to be as far as healing is concerned completely. Fortunately I do not see any evidence of active infection locally nor systemically which is great news and I am very pleased in that regard. 08-21-23 upon evaluation today patient appears to be doing poorly currently in regard to her wound. She has been tolerating the dressing changes without complication. Fortunately there does not appear to be any signs of active infection locally nor systemically at this time. 08-28-2022 upon evaluation today patient appears to be doing poorly in regard to her wound this was not wrapped appropriately home health did not go up high enough on the wrap. This has caused some issues and I discussed that with the patient today. I do believe that she is going to require aggressive wrapping and treatment which she is getting the [facility] topical antibiotics today they can start using that at the next wrap on Friday. In the meantime they need to make sure that the rapid and appropriately we wrote very specific orders today. 09-04-2022 upon evaluation today patient appears to be doing well currently in regard to her wound which I think is making progress is still hurting her quite significantly. Fortunately I do not see any signs of active infection locally or systemically which is great news. No fevers, chills, nausea, vomiting, or diarrhea. 09-11-2022 upon evaluation today patient's wound actually is showing signs of being a little bit smaller and looking a little bit better she still has a lot going on here however. Fortunately I do not see any evidence of active infection Worsening locally nor systemically which is great news. With that being said worsening  still continue to use the topical Keystone antibiotics. 09-18-2022 upon evaluation today patient actually showing some signs of improvement. I am actually very pleased with where we stand compared to where we have been. I think that she is making good headway here. She is still having quite a bit of pain but it seems to be lessening compared to previous. 09-25-2022 upon evaluation patient's wound actually showing signs slowly but surely of improvement. Fortunately I do not see any signs of active infection at this time systemically and locally I feel like this is dramatically improved.  She is doing well with the [facility] topical antibiotics. 10-02-2022 upon evaluation today patient appears to be doing better in regard to her wound overall. I am very pleased with where things stand from a visual standpoint I do not see any signs of active infection and in general I think that we are moving in the right direction here. 10-09-2022 upon evaluation today patient appears to be doing well currently in regard to her wound. She has been tolerating the dressing changes without complication. Fortunately I do not see any signs of active infection at this time which is great news. 10-16-2022 upon evaluation today patient continues to have quite a bit of pain in regard to her right leg. We have been attempting to debride little by little as we could but again was pretty limited by the fact that she is having significant discomfort. We do not actually have any signs of active infection going on at this point that the [facility] topical antibiotics have been helping quite readily. I been attempting to do as much debridement as I can but if she were to have pain medication this would actually help and in the past when she did it was much more tolerable for her as far as taking the edge off. Right now she has been without as she is in transition from her previous pain management physician to someone new to manage  this. 10-23-2022 upon evaluation patient appears to be doing excellent in regard to her wound. She has been tolerating the dressing changes without complication. Fortunately I do not see any evidence of active infection locally nor systemically which is great news. No fevers, chills, nausea, vomiting, or diarrhea. 7/24; patient with a wound secondary to chronic venous insufficiency on the right lateral lower leg. We have been using Keystone antibiotic Hydrofera Blue under kerlix Coban. She complains of a lot of pain after last week's debridement otherwise things appear to be improved per discussion with our intake nurse. 11-06-2022 upon evaluation today patient appears to be doing excellent at this point in regard to her wound. She has been tolerating the dressing changes without complication and to be honest she is making excellent progress towards complete closure. I am actually very pleased with where we stand I think that she is doing excellent as far as the overall appearance of the wound is concerned as well. She is going require some debridement however. 11-20-2022 upon evaluation today patient appears to be doing well currently in regard to her wound. She has been tolerating the dressing changes without complication. Fortunately there does not appear to be any signs of active infection locally or systemically which is great news and in general I do believe that we are moving in the right direction here. No fevers, chills, nausea, vomiting, or diarrhea. 11-27-2022 upon evaluation today patient appears to be doing a little better in regards to the overall appearance of her wound but unfortunately she is having increased pain. The collagen seems to be getting very dry and stuck to the wound bed which is causing her some issues here. Fortunately I do not see any signs of active infection locally nor systemically at this time. Fortunately I think that her wound is better but unfortunately her pain has  not. For that reason I am going to avoid any debridement today since the patient is very upset about the pain and how bad this is hurting at this point. I think we can have to try something a little bit different I  am thinking PolyMem may be a good option. 8/28; this patient has difficult venous wounds on the right lateral lower leg and ankle in the setting of chronic venous insufficiency. We have been using polymen under compression with kerlix Coban. SAIRY, LEWI ([id number]) [id number] Page 10 of 12 12-11-2022 upon evaluation today patient appears to be doing well currently in regard to her wound. She has been tolerating the dressing changes without complication and actually appears to be doing much better this week compared to when I saw her 2 weeks ago. The wound is measuring significantly smaller. We are using PolyMem along with Kerlix and Coban. 12-18-2022 upon evaluation today patient appears to be doing well currently in regard to her wound. This is actually showing signs of improvement is measuring a little bit smaller and looking better. Fortunately I do not see any evidence of worsening overall and I believe that we will making good headway with the PolyMem and the [facility] topical antibiotics. 12-25-2022 upon evaluation today patient appears to be doing well currently in regard to her wound. She has been tolerating the dressing changes without complication. Fortunately I do not see any evidence of infection locally or systemically which is great news and in general I do believe that we will make an excellent headway towards complete closure also excellent news. 01-01-2023 upon evaluation patient's wound actually showing signs of improvement the wrap was actually put on properly this week and this is good news. Overall I am extremely happy with where things stand and how this appears today I do not see any signs of worsening overall and I believe that the  patient is making excellent headway towards complete closure which is great news. 01/08/2023 upon evaluation today patient appears to be doing well currently in regard to her leg. She is actually draining much less unfortunately home health is just not doing very well getting the supplies necessary in order to continue to take care of the patient. They are now telling me they cannot get the PolyMem which is something that has been doing really well for her. That really does not make sense to me you came to get PolyMem for a hospice patient. Nonetheless either way I think we may need to just discontinue treatment with home health and just manage the patient here at the clinic. 01-15-2023 upon evaluation today patient appears to be doing well currently in regard to her wound. She has been tolerating the dressing changes without complication. Fortunately I do not see any signs of infection I think she is doing quite well and very pleased with where we stand today. No fevers, chills, nausea, vomiting, or diarrhea. 01-22-2023 upon evaluation today patient appears to be doing well currently in regard to her wound. She has been tolerating the dressing changes without complication and both wounds are measuring smaller today and look to be doing excellent. I am actually very pleased with what ever standing here and I think that she is making really good headway towards closure which is great news. 01-29-2023 upon evaluation today patient actually tells me she has been having some increased pain. Subsequently it took a little bit of drilling down but in the end I realized that we actually ended up putting a full Unna boot on her last week as opposed to just using a good anchor at the top and bottom. I think this is what wound up with her having increased discomfort because she tells me as she was doing well when she came  in last time and by the time she left she tells me this was already getting significantly  worse. And she hurt most of the week. With that being said I do believe that the patient is doing quite well at this point with regard to her wounds and the wrap did okay it was not a problem other than the increased pain I think we may benefit from a Urgo K2 light compression wrap as this will be much more comfortable I think compared to what we have been doing with just the Kerlix Coban anyway. 02-05-2023 upon evaluation today patient appears to be doing well currently in regard to her wounds. She has been tolerating the dressing changes without complication. Fortunately I do not see any signs of active infection looking systemically which is great news. No fevers, chills, nausea, vomiting, or diarrhea. 11/6; patient I remember from a previous stay in this clinic With a left lower extremity wound.. She has a right lower extremity venous wound. She has been using Keystone, polymen under [employer]. Her wound now is to remaining open areas. There is no evidence of infection although she is tender in the area. I wonder whether there is stasis dermatitis here. She reminds me that she had some form of skin graft on this area during his stay at the [facility] wound care center presumably in around 2022. 11/13; difficult wound on the right lateral lower extremity. She has a history of venous wounds in this area and a history of a skin graft in this area. She can planes about a lot of pain but she has not been systemically unwell. No fever chills etc. Objective Constitutional Sitting or standing Blood Pressure is within target range for patient.. Pulse regular and within target range for patient.Marland Kitchen Respirations regular, non-labored and within target range.. Temperature is normal and within the target range for the patient.Marland Kitchen Appears in no distress. Vitals Time Taken: 10:45 AM, Height: 62 in, Weight: 171 lbs, BMI: 31.3, Temperature: 99.1 F, Pulse: 52 bpm, Respiratory Rate: 18 breaths/min, Blood Pressure: 124/72  mmHg. General Notes: Wound exam; 2 open areas that were originally part of the large open wound. The wounds are superficial not a very viable surface today. I used a #5 curette to debride as much of this as I could including surrounding callus skin and subcutaneous debris. She does not tolerate debridement of the inferior wound very well Integumentary (Hair, Skin) Wound #5 status is Open. Original cause of wound was Trauma. The date acquired was: 04/17/2022. The wound has been in treatment 39 weeks. The wound is located on the Right,Lateral Lower Leg. The wound measures 2.4cm length x 0.8cm width x 0.1cm depth; 1.508cm^2 area and 0.151cm^3 volume. There is Fat Layer (Subcutaneous Tissue) exposed. There is no tunneling or undermining noted. There is a medium amount of serosanguineous drainage noted. The wound margin is distinct with the outline attached to the wound base. There is large (67-100%) red granulation within the wound bed. There is a small (1-33%) amount of necrotic tissue within the wound bed including Adherent Slough. The periwound skin appearance exhibited: Scarring, Dry/Scaly, Hemosiderin Staining. The periwound skin appearance did not exhibit: Callus, Crepitus, Excoriation, Induration, Rash, Maceration, Atrophie Blanche, Cyanosis, Ecchymosis, Mottled, Pallor, Rubor, Erythema. Periwound temperature was noted as No Abnormality. The periwound has tenderness on palpation. Wound #7 status is Open. Original cause of wound was Gradually Appeared. The date acquired was: 01/01/2023. The wound has been in treatment 7 weeks. The wound is located on  the Right,Posterior Lower Leg. The wound measures 2.9cm length x 2.7cm width x 0.1cm depth; 6.15cm^2 area and 0.615cm^3 volume. There is Fat Layer (Subcutaneous Tissue) exposed. There is no tunneling or undermining noted. There is a medium amount of serosanguineous drainage noted. The wound margin is distinct with the outline attached to the wound base.  There is large (67-100%) red, pink granulation within the wound bed. There is a small (1-33%) amount of necrotic tissue within the wound bed including Adherent Slough. The periwound skin appearance exhibited: Scarring, Hemosiderin Staining. The periwound skin appearance did not exhibit: Callus, Crepitus, Excoriation, Induration, Rash, Dry/Scaly, Maceration, Atrophie Blanche, Cyanosis, Ecchymosis, Mottled, Pallor, Rubor, Erythema. Periwound temperature was noted as No Abnormality. The periwound has tenderness on palpation. 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 There was a Excisional Skin/Subcutaneous Tissue Debridement with a total area of 1.51 sq cm performed by Maxwell Caul., MD. With the following instrument(s): Curette to remove Viable and Non-Viable tissue/material. Material removed includes Eschar, Callus, Subcutaneous Tissue, Skin: Dermis, and Skin: Epidermis after achieving pain control using Lidocaine 4% Topical Solution. No specimens were taken. A time out was conducted at 11:18, prior to the start of the procedure. A Minimum amount of bleeding was controlled with Pressure. The procedure was tolerated well. Post Debridement Measurements: 2.4cm length x 0.8cm width x 0.1cm depth; 0.151cm^3 volume. Character of Wound/Ulcer Post Debridement is improved. Severity of Tissue Post Debridement is: Fat layer exposed. Post procedure Diagnosis Wound #5: Same as Pre-Procedure Wound  #7 Pre-procedure diagnosis of Wound #7 is a Venous Leg Ulcer located on the Right,Posterior Lower Leg .Severity of Tissue Pre Debridement is: Fat layer exposed. There was a Excisional Skin/Subcutaneous Tissue Debridement with a total area of 6.15 sq cm performed by Maxwell Caul., MD. With the following instrument(s): Curette to remove Viable and Non-Viable tissue/material. Material removed includes Eschar, Callus, Subcutaneous Tissue, Skin: Dermis, and Skin: Epidermis after achieving pain control using Lidocaine 4% T opical Solution. No specimens were taken. A time out was conducted at 11:18, prior to the start of the procedure. A Minimum amount of bleeding was controlled with Pressure. The procedure was tolerated well. Post Debridement Measurements: 2.9cm length x 2.7cm width x 0.1cm depth; 0.615cm^3 volume. Character of Wound/Ulcer Post Debridement is improved. Severity of Tissue Post Debridement is: Fat layer exposed. Post procedure Diagnosis Wound #7: Same as Pre-Procedure Plan Follow-up Appointments: Return Appointment in 1 week. - Dr. Leanord Hawking Wednesday (already scheduled) 02/26/2023 10:30am Return Appointment in 2 weeks. - Dr. Leanord Hawking Wednesday Nurse Visit: - 03/05/2023 (already scheduled) 1030 ****change Wound Care encounter to nurse visit***** Anesthetic: Wound #5 Right,Lateral Lower Leg: (In clinic) Topical Lidocaine 4% applied to wound bed Bathing/ Shower/ Hygiene: May shower and wash wound with soap and water. - with dressing changes. Edema Control - Orders / Instructions: Elevate legs to the level of the heart or above for 30 minutes daily and/or when sitting for 3-4 times a day throughout the day. Avoid standing for long periods of time. Exercise regularly - As tolerated WOUND #5: - Lower Leg Wound Laterality: Right, Lateral Cleanser:  Soap and Water 1 x Per Week/30 Days Discharge Instructions: May shower and wash wound with dial antibacterial soap and water prior to dressing  change. Peri-Wound Care: Sween Lotion (Moisturizing lotion) 1 x Per Week/30 Days Discharge Instructions: Apply moisturizing lotion as directed Topical: compounding topical antibiotics 1 x Per Week/30 Days Discharge Instructions: apply Keystone directly to wound bed under the hydrofera blue. Home Health start. MIX ACCORDING TO THE PHARMACY INSTRUCTIONS. Prim Dressing: PolyMem Non-Adhesive Dressing, 4x4 in 1 x Per Week/30 Days ary Discharge Instructions: ******CUT SLITS INTO THE POLYMEM.***** Apply to wound bed as instructed Secondary Dressing: ABD Pad, 8x10 1 x Per Week/30 Days Discharge Instructions: Apply over primary dressing as directed. Secondary Dressing: Woven Gauze Sponge, Non-Sterile 4x4 in 1 x Per Week/30 Days Discharge Instructions: Apply over primary dressing as directed. Secondary Dressing: Zetuvit Plus 4x8 in 1 x Per Week/30 Days Discharge Instructions: Or double ABD pad Apply over primary dressing as directed. Com pression Wrap: Kerlix Roll 4.5x3.1 (in/yd) 1 x Per Week/30 Days Discharge Instructions: Apply Kerlix and Coban compression as directed. Com pression Wrap: Coban Self-Adherent Wrap 4x5 (in/yd) 1 x Per Week/30 Days Discharge Instructions: Apply over Kerlix as directed. Com pression Wrap: unna boot FIRST LAYER **** SEE INSTRUCTIONS 1 x Per Week/30 Days Discharge Instructions: APPLY FIRST LAYER UNNA BOOT AT BASES OF TOES AND JUST BELOW THE KNEE TO HOLD COMPRESSION WRAP IN PLACE. WOUND #7: - Lower Leg Wound Laterality: Right, Posterior Cleanser: Soap and Water 1 x Per Week/30 Days Discharge Instructions: May shower and wash wound with dial antibacterial soap and water prior to dressing change. Peri-Wound Care: Sween Lotion (Moisturizing lotion) 1 x Per Week/30 Days Discharge Instructions: Apply moisturizing lotion as directed Topical: compounding topical antibiotics 1 x Per Week/30 Days Discharge Instructions: apply Keystone directly to wound bed under the hydrofera  blue. Home Health start. MIX ACCORDING TO THE PHARMACY INSTRUCTIONS. Prim Dressing: PolyMem Non-Adhesive Dressing, 4x4 in 1 x Per Week/30 Days RUNELL, KITCHINGS ([id number]) [id number] Page 12 of 12 Discharge Instructions: ******CUT SLITS INTO THE POLYMEM.***** Apply to wound bed as instructed Secondary Dressing: ABD Pad, 8x10 1 x Per Week/30 Days Discharge Instructions: Apply over primary dressing as directed. Secondary Dressing: Woven Gauze Sponge, Non-Sterile 4x4 in 1 x Per Week/30 Days Discharge Instructions: Apply over primary dressing as directed. Secondary Dressing: Zetuvit Plus 4x8 in 1 x Per Week/30 Days Discharge Instructions: Or double ABD pad Apply over primary dressing as directed. Compression Wrap: Kerlix Roll 4.5x3.1 (in/yd) 1 x Per Week/30 Days Discharge Instructions: Apply Kerlix and Coban compression as directed. Compression Wrap: Coban Self-Adherent Wrap 4x5 (in/yd) 1 x Per Week/30 Days Discharge Instructions: Apply over Kerlix as directed. Compression Wrap: unna boot FIRST LAYER **** SEE INSTRUCTIONS 1 x Per Week/30 Days Discharge Instructions: APPLY FIRST LAYER UNNA BOOT AT BASES OF TOES AND JUST BELOW THE KNEE TO HOLD COMPRESSION WRAP IN PLACE. 1. I continued with the [facility], polymen ABD. however I have reduced the compression to kerlix Coban in case that had something to do with her discomfort 2. Your family states that she had the same problem with previous wounds in this area and even on the left leg. 3. I see no evidence of infection 4. May need to change the dressing next week if we continued to get adherent surface material Electronic Signature(s) Signed: 02/20/2023 4:06:32 PM By: Jamie Najjar MD Entered By: Jamie [name] on 02/19/2023 09:06:51 -------------------------------------------------------------------------------- SuperBill Details Patient Name: Date of Service: Jamie Brink, MA [name] 02/19/2023  Medical  Record Number: [id number] Patient Account Number: [id number] Date of Birth/Sex: Treating RN: 1940-07-09 (82 y.o. Jamie [name] Primary Care Provider: Belva Agee Other Clinician: Referring Provider: Treating Provider/Extender: Vivia Budge in Treatment: 39 Diagnosis Coding ICD-10 Codes Code Description [phone] Chronic venous hypertension (idiopathic) with ulcer and inflammation of right lower extremity 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d I25.10 Atherosclerotic heart disease of native coronary artery without angina pectoris Facility Procedures : CPT4 Code: [id number] Description: 11042 - DEB SUBQ TISSUE 20 SQ CM/< ICD-10 Diagnosis Description I87.331 Chronic venous hypertension (idiopathic) with ulcer and inflammation of right Modifier: lower extremity Quantity: 1 Physician Procedures : CPT4 Code Description Modifier [id number] 11042 - WC PHYS SUBQ TISS 20 SQ CM ICD-10 Diagnosis Description I87.331 Chronic venous hypertension (idiopathic) with ulcer and inflammation of right lower extremity Quantity: 1 Electronic Signature(s) Signed: 02/20/2023 4:06:32 PM By: Jamie Najjar MD Entered By: Jamie [name] on 02/19/2023 09:07:00

## 2023-02-26 ENCOUNTER — Encounter (HOSPITAL_BASED_OUTPATIENT_CLINIC_OR_DEPARTMENT_OTHER): Payer: Medicare HMO | Admitting: Internal Medicine

## 2023-02-26 DIAGNOSIS — L97812 Non-pressure chronic ulcer of other part of right lower leg with fat layer exposed: Secondary | ICD-10-CM | POA: Diagnosis not present

## 2023-02-26 NOTE — Progress Notes (Signed)
Jamie, [name] ([id number]) [id number] Page 1 of 13 Visit Report for 02/26/2023 Debridement Details Patient Name: Date of Service: Jamie [name], [name]. 02/26/2023 10:30 A M Medical Record Number: [id number] Patient Account Number: [id number] Date of Birth/Sex: Treating RN: 05/20/1940 (82 y.o. F) Primary Care Provider: Belva Agee Other Clinician: Referring Provider: Treating Provider/Extender: Vivia Budge in Treatment: 40 Debridement Performed for Assessment: Wound #5 Right,Lateral Lower Leg Performed By: Physician Maxwell Caul., MD The following information was scribed by: Karie Schwalbe The information was scribed for: Jamie [name] Debridement Type: Debridement Severity of Tissue Pre Debridement: Fat layer exposed Level of Consciousness (Pre-procedure): Awake and Alert Pre-procedure Verification/Time Out Yes - 11:28 Taken: Start Time: 11:28 Pain Control: Lidocaine 5% topical ointment Percent of Wound Bed Debrided: 100% T Area Debrided (cm): otal 1.44 Tissue and other material debrided: Non-Viable, Slough, Slough Level: Non-Viable Tissue Debridement Description: Selective/Open Wound Instrument: Curette Bleeding: Minimum Hemostasis Achieved: Pressure End Time: 11:32 Procedural Pain: 0 Post Procedural Pain: 0 Response to Treatment: Procedure was tolerated well Level of Consciousness (Post- Awake and Alert procedure): Post Debridement Measurements of Total Wound Length: (cm) 2.3 Width: (cm) 0.8 Depth: (cm) 0.1 Volume: (cm) 0.145 Character of Wound/Ulcer Post Debridement: Improved Severity of Tissue Post Debridement: Fat layer exposed Post Procedure Diagnosis Same as Pre-procedure Electronic Signature(s) Signed: 02/26/2023 4:37:54 PM By: Jamie Najjar MD Entered By: Jamie [name] on 02/26/2023  09:06:08 -------------------------------------------------------------------------------- Debridement Details Patient Name: Date of Service: Jamie [name], [name] 02/26/2023 10:30 A M Medical Record Number: [id number] Patient Account Number: [id number] Date of Birth/Sex: Treating RN: [birth year] (82 y.o. F) Primary Care Provider: Belva Agee Other Clinician: Referring Provider: Treating Provider/Extender: Vivia Budge in Treatment: [address] [place] ([id number]) [id number] Page 2 of 13 Debridement Performed for Assessment: Wound #7 Right,Posterior Lower Leg Performed By: Physician Maxwell Caul., MD The following information was scribed by: Karie Schwalbe The information was scribed for: Jamie [name] Debridement Type: Debridement Severity of Tissue Pre Debridement: Fat layer exposed Level of Consciousness (Pre-procedure): Awake and Alert Pre-procedure Verification/Time Out Yes - 11:28 Taken: Start Time: 11:28 Pain Control: Lidocaine 5% topical ointment Percent of Wound Bed Debrided: 100% T Area Debrided (cm): otal 12.56 Tissue and other material debrided: Non-Viable, Slough, Slough Level: Non-Viable Tissue Debridement Description: Selective/Open Wound Instrument: Curette Bleeding: Minimum Hemostasis Achieved: Pressure End Time: 11:32 Procedural Pain: 0 Post Procedural Pain: 0 Response to Treatment: Procedure was tolerated well Level of Consciousness (Post- Awake and Alert procedure): Post Debridement Measurements of Total Wound Length: (cm) 4 Width: (cm) 4 Depth: (cm) 0.1 Volume: (cm) 1.257 Character of Wound/Ulcer Post Debridement: Improved Severity of Tissue Post Debridement: Fat layer exposed Post Procedure Diagnosis Same as Pre-procedure Electronic Signature(s) Signed: 02/26/2023 4:37:54 PM By: Jamie Najjar MD Entered By: Jamie [name] on 02/26/2023  09:06:19 -------------------------------------------------------------------------------- HPI Details Patient Name: Date of Service: Jamie Brink, [name] 02/26/2023 10:30 A M Medical Record Number: [id number] Patient Account Number: [id number] Date of Birth/Sex: Treating RN: [birth year] (82 y.o. F) Primary Care Provider: Belva Agee Other Clinician: Referring Provider: Treating Provider/Extender: Vivia Budge in Treatment: 40 History of Present Illness HPI Description: ADMISSION 06/30/2020 Jamie [name] is an 82 year old woman who lives in [place]. She is here with her niece for review of wounds on the left medial lower leg and ankle. These have apparently been present for over a year and she followed with Dr. Olegario Messier at the [facility] in [place] for quite  a period of time although it looks as though there was a initial consult wound from Dr. Marcha Solders on February 18 presumably there was therefore hiatus. At that point the wounds were described as being there for 3 months. She also tells me she was at the wound care center in [place] for a period of time with this. 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 She had venous studies that were negative for DVT ABIs on the right were 1.01 on the left 1.06. She has . had previous applications of puraply, compression which she does not tolerate very well. She has had several rounds of oral antibiotic therapy. She complains of unrelenting pain and she is seeing Dr. Reece Agar V of pain management apparently was on oxycodone but that did not help. She also had a skin biopsy done by Dr. Olegario Messier although we do not have that result. She does not appear to have an arterial issue. I am not completely clear what she has been putting on the wounds lately. 3/31; this patient has a particularly nasty set of wounds on the left medial ankle in the middle of what looks to be  hemosiderin deposition secondary to chronic venous insufficiency. She has a lot of pain followed by pain management. Dr. Marcha Solders apparently did a biopsy of something on the left leg last fall what I KENDRIANNA, [name] ([id number]) [id number] Page 3 of 13 would like to get this result. She has a history of MRSA treatment. I do not believe she had reflux studies but she did have DVT rule out studies. Previous ABIs have not suggested arterial insufficiency 4/7; difficult wounds on the left medial ankle probably chronic venous insufficiency. With considerable effort on behalf of our case manager we were able to finally to speak to somebody at the hospital in [place] who indicated that no biopsy of this area have been done even though the patient describes this in some detail and is even able to point out where she thinks the biopsy was done. We have been using Sorbact. The PCR culture I did showed polymicrobial identification with Pseudomonas, staph aureus, Peptostreptococcus. All of this and low titers. Resistance genes identified were MRSA, staph virulence gene and tetracycline. We are going to send this to [facility] for a topical antibiotic which is something that we have had good success with recently in large venous ulcers with a lot of purulent drainage. There would not be an easy oral alternative here possibly line escalated and ciprofloxacin if we need to use systemic antibiotic 4/15; difficult area on the left medial ankle. Most likely chronic venous insufficiency. I think she will probably need venous reflux study I think she had DVT rule outs but not venous reflux studies. We have not yet obtained the topical antibiotics. She has home health changing the dressing we have been using Sorbact for adherent fibrinous debris on the surface. Very difficult to remove 4/22; patient presents for 1 week follow-up. She has been using sore back under compression wraps and these are  changed 3 times a week with home health. She also had Keystone antibiotics sent to her house and brought them in today. She has no complaints or issues today. 5/2; patient is here for follow-up. She has been using Sorbact under compression. Very painful wound. She has been using Keystone antibiotics. Not much improvement although the more medial part of the wound has cleaned up nicely and the larger part of the wound about 50%  slough covered. Part of the issue here is that she had stays at both [facility] wound care center, [facility] wound care center and now [place]. Not sure if she has had venous reflux studies. As far as we are able to tell she did not have a biopsy. My notes state that she did not have venous reflux studies just DVT rule outs. 5/16; patient goes for venous reflux studies this afternoon. She says that wound was biopsied which sounds like punch biopsies by Dr. Lynden Ang we do not have these results. We are using Sorbact. Very difficult wound to debride 5/23; patient presents for 1 week follow-up. She reports tolerating the wraps well with sorbact underneath. She had ABIs and venous reflux studies done. She has no issues or complaints today. She denies acute signs of infection. 6/6; I have reviewed the patient's vascular studies. Wounds are on the left medial and posterior calf. She had significant reflux in the greater saphenous vein in the in the mid thigh, distal thigh knee and the small saphenous vein in the popliteal fossa. The vein diameters do not look too impressive though. She is going to see the vascular surgeon on Wednesday. She also had venous reflux in the right common femoral vein. She did not have any evidence of a DVT or SVT I am wondering whether there is an ablation procedure that would benefit her in the greater saphenous vein on the left. . She tells [place] that home health put the dressing on too tight and she took off 1 layer. The swelling in her left leg is a little worse as a  result of this. Not much change in the wound measurements so the surface of the wound looks better Her arterial studies showed an ABI on the right of 1.14 with a triphasic waveform and a great toe pressure of 0.89. On the left her ABIs were noncompressible at 1.34 but with triphasic waveforms and a TBI of 0.97. Her greater toe pressure was 122. There was no evidence of significant bilateral arterial disease 6/20 patient went to see Dr. Durwin Nora. He did not think she had significant arterial disease. In terms of her venous duplex on the right side there was no evidence of a DVT or SVT there was deep venous reflux involving the common femoral vein no superficial vein reflux on the left side there was no DVT or SVT there was no deep vein graft reflux there was some reflux in the greater saphenous vein from the mid thigh to the knee but the vein here was not dilated. He thought these were venous wounds he prescribed a compression pump but I am not sure who we ordered it from The patient has been approved for Apligraf. Still using silver collagen this week 7/5; Apligraf #1 7/19 Apligraf #2. Decent improvement in the condition of the wound bed. Epithelialization distal 8/2 Apligraf #3. No issues or complaints. Denies signs of infection. 8/17 Apligraf #4. No issues or concerns. Some complaints of pruritus and the rash. Our intake nurse brought up the fact that she had previously indicated possible cotton layer sensitivity we will therefore use kerlix in the bottom layer of the compression 8/30; the patient comes in with the area on her left medial leg just about healed. There is a superficial area more towards the tibia and a smaller open area distally everything else is epithelialized. I do not think she requires another Apligraf 9/13; left medial leg is healed. She has thick areas of chronic hypertrophied skin in  this area as well as likely lipodermatosclerosis. Her edema control is  good Readmisstion: 05-22-2022 upon evaluation today patient presents for initial inspection here in our clinic concerning issues that she has been having with a wound of the right lateral lower extremity. This is an area of a previous skin graft she tells me. With that being said she unfortunately had a scrape on this that occurred around 10 January. Since that time she has noted that this has just continued to get bigger and bigger in her niece who is present with her today actually states that 2 weeks ago when she saw that it was significantly smaller than what she sees currently. Obviously this is of utmost concern as we do not want this to continue to get larger when arrested and get moving in the right direction. Fortunately there does not appear to be any signs of systemic infection though locally I think we probably do have some infection present. I would obtain a culture to see what we have going on here and then we will subsequently see where things go going forward. Fortunately I think that she is in the right place at this point being at the wound center we can definitely do something to try to get this moving in the right direction. Patient does have a history of chronic venous insufficiency as well as coronary artery disease. In the past she has done well with compression wraps on the start with a 3 layer wrap I think she is probably can end up going to a 4-layer at some point but we will see how things do over the next week. She has been using wound cleanser along with she tells me a zinc and topical but again I am not sure exactly what that was. 05-29-2022 upon evaluation today patient appears to be doing well currently in regard to her wound. This actually is showing signs of improvement I am happy in that regard unfortunately her left leg has an area on the shin that has opened. This is the region where one of the areas at least we have previously taken care of. Nonetheless this is  small hoping we get it under control before things worsen significantly. 06-05-2022 upon evaluation today patient appears to be doing well currently in regard to her wounds. The actually seem to be fairly clean she is having still quite a bit of problem with pain at this point she would like to not have debridement today for all possible. With that being said I do believe that we are making some good progress I think the [facility] is doing a decent job here. 3/6; patient has had 3/6; patient with known severe chronic venous insufficiency. She apparently had a traumatic wound at home and has a reasonably substantial wound on the right lateral lower leg and a smaller one on the left anterior lower leg as well. We have been using Hydrofera Blue and Unna boots 3/13; patient presents for follow-up. We have been using Hydrofera Blue under Coflex to the legs bilaterally. She has no issues or complaints today. 06-26-2022 upon evaluation today patient appears to be doing well currently in regard to her wound. This is measuring a little bit larger however. Fortunately I do not see any signs of infection this is on the right side. Fortunately the left side is actually completely healed which is great news. 07-03-2022 patient's wound unfortunately is continuing to show signs of being worse. Her compression which was the Tubigrip that I  thought should be able to pull up actually slipped down and she was not able to pull that back up. With that being said that means that unfortunately her wound has continued to deteriorate since I last saw her. This is definitely not the direction that we are looking for. I discussed with her that I do believe we need to go ahead and see about getting her started on antibiotics and I subsequently would also like to go ahead and see about getting things moving forward with regard to a wound culture and making a change up her dressings as well but this can be changed more frequently  than just once a week. 07-10-2022 upon evaluation today patient actually appears to be doing much better. I do believe that the antibiotics have been beneficial for her. Fortunately I do not see any signs of active infection locally nor systemically which is great news. No fevers, chills, nausea, vomiting, or diarrhea. MORNA, ISABELLE ([id number]) [id number] Page 4 of 13 07-17-2022 upon evaluation today patient appears to be doing well currently in regard to her wound which is actually showing signs of improvement this is slow but nonetheless prevalent that we are seeing good improvement here. Fortunately I do not see any signs of active infection locally nor systemically which is great news. 07-24-2022 upon evaluation today patient appears to be doing well currently in regard to her wound although the wrap that we had on has been slipping down and she really does not have anybody to help her with this at home health is not going to be coming out we could not get anybody that would actually be able to do the dressing changes. Fortunately I do not see any signs of active infection locally nor systemically which is great news. 08-07-22 poorly in regard to her leg compared to what it was previous. Fortunately there does not appear to be any signs of active infection locally nor systemically which is great news. No fevers, chills, nausea, vomiting, or diarrhea. With that being said it does appear that the patient may have some cellulitis in the leg which is not good. 08-14-2022 upon evaluation today patient appears to be doing somewhat better in regard to her wounds she still is having quite a bit of pain we are still not where we want to be as far as healing is concerned completely. Fortunately I do not see any evidence of active infection locally nor systemically which is great news and I am very pleased in that regard. 08-21-23 upon evaluation today patient appears to be doing  poorly currently in regard to her wound. She has been tolerating the dressing changes without complication. Fortunately there does not appear to be any signs of active infection locally nor systemically at this time. 08-28-2022 upon evaluation today patient appears to be doing poorly in regard to her wound this was not wrapped appropriately home health did not go up high enough on the wrap. This has caused some issues and I discussed that with the patient today. I do believe that she is going to require aggressive wrapping and treatment which she is getting the [facility] topical antibiotics today they can start using that at the next wrap on Friday. In the meantime they need to make sure that the rapid and appropriately we wrote very specific orders today. 09-04-2022 upon evaluation today patient appears to be doing well currently in regard to her wound which I think is making progress is still hurting her quite significantly. Fortunately  I do not see any signs of active infection locally or systemically which is great news. No fevers, chills, nausea, vomiting, or diarrhea. 09-11-2022 upon evaluation today patient's wound actually is showing signs of being a little bit smaller and looking a little bit better she still has a lot going on here however. Fortunately I do not see any evidence of active infection Worsening locally nor systemically which is great news. With that being said worsening still continue to use the topical Keystone antibiotics. 09-18-2022 upon evaluation today patient actually showing some signs of improvement. I am actually very pleased with where we stand compared to where we have been. I think that she is making good headway here. She is still having quite a bit of pain but it seems to be lessening compared to previous. 09-25-2022 upon evaluation patient's wound actually showing signs slowly but surely of improvement. Fortunately I do not see any signs of active infection at this time  systemically and locally I feel like this is dramatically improved. She is doing well with the [facility] topical antibiotics. 10-02-2022 upon evaluation today patient appears to be doing better in regard to her wound overall. I am very pleased with where things stand from a visual standpoint I do not see any signs of active infection and in general I think that we are moving in the right direction here. 10-09-2022 upon evaluation today patient appears to be doing well currently in regard to her wound. She has been tolerating the dressing changes without complication. Fortunately I do not see any signs of active infection at this time which is great news. 10-16-2022 upon evaluation today patient continues to have quite a bit of pain in regard to her right leg. We have been attempting to debride little by little as we could but again was pretty limited by the fact that she is having significant discomfort. We do not actually have any signs of active infection going on at this point that the [facility] topical antibiotics have been helping quite readily. I been attempting to do as much debridement as I can but if she were to have pain medication this would actually help and in the past when she did it was much more tolerable for her as far as taking the edge off. Right now she has been without as she is in transition from her previous pain management physician to someone new to manage this. 10-23-2022 upon evaluation patient appears to be doing excellent in regard to her wound. She has been tolerating the dressing changes without complication. Fortunately I do not see any evidence of active infection locally nor systemically which is great news. No fevers, chills, nausea, vomiting, or diarrhea. 7/24; patient with a wound secondary to chronic venous insufficiency on the right lateral lower leg. We have been using Keystone antibiotic Hydrofera Blue under kerlix Coban. She complains of a lot of pain after last week's  debridement otherwise things appear to be improved per discussion with our intake nurse. 11-06-2022 upon evaluation today patient appears to be doing excellent at this point in regard to her wound. She has been tolerating the dressing changes without complication and to be honest she is making excellent progress towards complete closure. I am actually very pleased with where we stand I think that she is doing excellent as far as the overall appearance of the wound is concerned as well. She is going require some debridement however. 11-20-2022 upon evaluation today patient appears to be doing well currently in regard  to her wound. She has been tolerating the dressing changes without complication. Fortunately there does not appear to be any signs of active infection locally or systemically which is great news and in general I do believe that we are moving in the right direction here. No fevers, chills, nausea, vomiting, or diarrhea. 11-27-2022 upon evaluation today patient appears to be doing a little better in regards to the overall appearance of her wound but unfortunately she is having increased pain. The collagen seems to be getting very dry and stuck to the wound bed which is causing her some issues here. Fortunately I do not see any signs of active infection locally nor systemically at this time. Fortunately I think that her wound is better but unfortunately her pain has not. For that reason I am going to avoid any debridement today since the patient is very upset about the pain and how bad this is hurting at this point. I think we can have to try something a little bit different I am thinking PolyMem may be a good option. 8/28; this patient has difficult venous wounds on the right lateral lower leg and ankle in the setting of chronic venous insufficiency. We have been using polymen under compression with kerlix Coban. 12-11-2022 upon evaluation today patient appears to be doing well currently in regard  to her wound. She has been tolerating the dressing changes without complication and actually appears to be doing much better this week compared to when I saw her 2 weeks ago. The wound is measuring significantly smaller. We are using PolyMem along with Kerlix and Coban. 12-18-2022 upon evaluation today patient appears to be doing well currently in regard to her wound. This is actually showing signs of improvement is measuring a little bit smaller and looking better. Fortunately I do not see any evidence of worsening overall and I believe that we will making good headway with the PolyMem and the [facility] topical antibiotics. 12-25-2022 upon evaluation today patient appears to be doing well currently in regard to her wound. She has been tolerating the dressing changes without complication. Fortunately I do not see any evidence of infection locally or systemically which is great news and in general I do believe that we will make an excellent headway towards complete closure also excellent news. 01-01-2023 upon evaluation patient's wound actually showing signs of improvement the wrap was actually put on properly this week and this is good news. Overall I am extremely happy with where things stand and how this appears today I do not see any signs of worsening overall and I believe that the patient is making excellent headway towards complete closure which is great news. 01/08/2023 upon evaluation today patient appears to be doing well currently in regard to her leg. She is actually draining much less unfortunately home health is just not doing very well getting the supplies necessary in order to continue to take care of the patient. They are now telling me they cannot get the PolyMem which is something that has been doing really well for her. That really does not make sense to me you came to get PolyMem for a hospice patient. MAKAELYNN, HERSMAN ([id number]) [id number] Page 5 of  13 Nonetheless either way I think we may need to just discontinue treatment with home health and just manage the patient here at the clinic. 01-15-2023 upon evaluation today patient appears to be doing well currently in regard to her wound. She has been tolerating the dressing changes without complication. Fortunately  I do not see any signs of infection I think she is doing quite well and very pleased with where we stand today. No fevers, chills, nausea, vomiting, or diarrhea. 01-22-2023 upon evaluation today patient appears to be doing well currently in regard to her wound. She has been tolerating the dressing changes without complication and both wounds are measuring smaller today and look to be doing excellent. I am actually very pleased with what ever standing here and I think that she is making really good headway towards closure which is great news. 01-29-2023 upon evaluation today patient actually tells me she has been having some increased pain. Subsequently it took a little bit of drilling down but in the end I realized that we actually ended up putting a full Unna boot on her last week as opposed to just using a good anchor at the top and bottom. I think this is what wound up with her having increased discomfort because she tells me as she was doing well when she came in last time and by the time she left she tells me this was already getting significantly worse. And she hurt most of the week. With that being said I do believe that the patient is doing quite well at this point with regard to her wounds and the wrap did okay it was not a problem other than the increased pain I think we may benefit from a Urgo K2 light compression wrap as this will be much more comfortable I think compared to what we have been doing with just the Kerlix Coban anyway. 02-05-2023 upon evaluation today patient appears to be doing well currently in regard to her wounds. She has been tolerating the dressing changes  without complication. Fortunately I do not see any signs of active infection looking systemically which is great news. No fevers, chills, nausea, vomiting, or diarrhea. 11/6; patient I remember from a previous stay in this clinic With a left lower extremity wound.. She has a right lower extremity venous wound. She has been using Keystone, polymen under [employer]. Her wound now is to remaining open areas. There is no evidence of infection although she is tender in the area. I wonder whether there is stasis dermatitis here. She reminds me that she had some form of skin graft on this area during his stay at the [facility] wound care center presumably in around 2022. 11/13; difficult wound on the right lateral lower extremity. She has a history of venous wounds in this area and a history of a skin graft in this area. She can planes about a lot of pain but she has not been systemically unwell. No fever chills etc. 11/20; continue difficult wounds on the right lateral lower extremity. She has a history of venous wounds in this area and a history of skin graft in this area. She continues to complain of pain. She comes in today with completely nonviable surfaces. She has been using [employer] under and Photographer) Signed: 02/26/2023 4:37:54 PM By: Jamie Najjar MD Entered By: Jamie [name] on 02/26/2023 09:07:25 -------------------------------------------------------------------------------- Physical Exam Details Patient Name: Date of Service: Jamie [name], [name] 02/26/2023 10:30 A M Medical Record Number: [id number] Patient Account Number: [id number] Date of Birth/Sex: Treating RN: 11/20/1940 (82 y.o. F) Primary Care Provider: Belva Agee Other Clinician: Referring Provider: Treating Provider/Extender: Vivia Budge in Treatment: 40 Constitutional Sitting or standing Blood Pressure is within target range for patient.. Pulse  regular and  within target range for patient.Marland Kitchen Respirations regular, non-labored and within target range.. Temperature is normal and within the target range for the patient.Marland Kitchen Appears in no distress. Notes Wound exam; 2 open areas that were originally part of the single open wound. Completely nonviable surface in both areas. I attempted debridement with a #5 curette I was able to get some debris off the surface but she simply does not tolerate this very well. Her edema control is moderate Electronic Signature(s) Signed: 02/26/2023 4:37:54 PM By: Jamie Najjar MD Entered By: Jamie [name] on 02/26/2023 09:09:17 -------------------------------------------------------------------------------- Physician Orders Details Patient Name: Date of Service: Jamie [name], [name] 02/26/2023 10:30 A M Medical Record Number: [id number] Patient Account Number: [id number] Date of Birth/Sex: Treating RN: 28-Feb-1941 (82 y.o. [name] Primary Care Provider: Belva Agee Other Clinician: Referring Provider: Treating Provider/Extender: Kelyn, Hochmuth, Lysle Rubens ([id number]) [id number] Page 6 of 13 Weeks in Treatment: 28 Verbal / Phone Orders: No Diagnosis Coding Follow-up Appointments ppointment in 1 week. - Dr. Leanord Hawking Wednesday (already scheduled)++++ 03/05/2023 10:30am NURSE VISIT++++ Return A ppointment in 2 weeks. - Dr. Leanord Hawking Wednesday 03/12/23 at 10:30am Return A Nurse Visit: - 03/05/2023 (already scheduled) 1030 ****change Wound Care encounter to nurse visit***** Anesthetic Wound #5 Right,Lateral Lower Leg (In clinic) Topical Lidocaine 4% applied to wound bed Bathing/ Shower/ Hygiene May shower and wash wound with soap and water. - with dressing changes. Edema Control - Orders / Instructions Elevate legs to the level of the heart or above for 30 minutes daily and/or when sitting for 3-4 times a day throughout the  day. Avoid standing for long periods of time. Exercise regularly - As tolerated Wound Treatment Wound #5 - Lower Leg Wound Laterality: Right, Lateral Cleanser: Soap and Water 1 x Per Week/30 Days Discharge Instructions: May shower and wash wound with dial antibacterial soap and water prior to dressing change. Peri-Wound Care: Sween Lotion (Moisturizing lotion) 1 x Per Week/30 Days Discharge Instructions: Apply moisturizing lotion as directed Topical: Skintegrity Hydrogel 4 (oz) 1 x Per Week/30 Days Discharge Instructions: Apply hydrogel with sorbact Topical: compounding topical antibiotics 1 x Per Week/30 Days Discharge Instructions: apply Keystone directly to wound bed under the hydrofera blue. Home Health start. MIX ACCORDING TO THE PHARMACY INSTRUCTIONS. Prim Dressing: Cutimed Sorbact Swab 1 x Per Week/30 Days ary Discharge Instructions: Apply to wound bed with Hydrogel Secondary Dressing: ABD Pad, 8x10 1 x Per Week/30 Days Discharge Instructions: Apply over primary dressing as directed. Secondary Dressing: Woven Gauze Sponge, Non-Sterile 4x4 in 1 x Per Week/30 Days Discharge Instructions: Apply over primary dressing as directed. Secondary Dressing: Zetuvit Plus 4x8 in 1 x Per Week/30 Days Discharge Instructions: Or double ABD pad Apply over primary dressing as directed. Compression Wrap: Kerlix Roll 4.5x3.1 (in/yd) 1 x Per Week/30 Days Discharge Instructions: Apply Kerlix and Coban compression as directed. Compression Wrap: Coban Self-Adherent Wrap 4x5 (in/yd) 1 x Per Week/30 Days Discharge Instructions: Apply over Kerlix as directed. Compression Wrap: unna boot FIRST LAYER **** SEE INSTRUCTIONS 1 x Per Week/30 Days Discharge Instructions: APPLY FIRST LAYER UNNA BOOT AT BASES OF TOES AND JUST BELOW THE KNEE TO HOLD COMPRESSION WRAP IN PLACE. Wound #7 - Lower Leg Wound Laterality: Right, Posterior Cleanser: Soap and Water 1 x Per Week/30 Days Discharge Instructions: May shower and  wash wound with dial antibacterial soap and water prior to dressing change. Peri-Wound Care: Sween Lotion (Moisturizing lotion) 1 x Per Week/30 Days Discharge Instructions: Apply moisturizing lotion as directed Topical:  Skintegrity Hydrogel 4 (oz) 1 x Per Week/30 Days Discharge Instructions: Apply hydrogel with sorbact Topical: compounding topical antibiotics 1 x Per Week/30 Days Discharge Instructions: apply Keystone directly to wound bed under the hydrofera blue. Home Health start. MIX ACCORDING TO THE PHARMACY INSTRUCTIONS. Prim Dressing: Cutimed Sorbact Swab 1 x Per Week/30 Days ary Discharge Instructions: Apply to wound bed with Hydrogel Secondary Dressing: ABD Pad, 8x10 1 x Per Week/30 Days SHERY, SIEBERT ([id number]) [id number] Page 7 of 13 Discharge Instructions: Apply over primary dressing as directed. Secondary Dressing: Woven Gauze Sponge, Non-Sterile 4x4 in 1 x Per Week/30 Days Discharge Instructions: Apply over primary dressing as directed. Secondary Dressing: Zetuvit Plus 4x8 in 1 x Per Week/30 Days Discharge Instructions: Or double ABD pad Apply over primary dressing as directed. Compression Wrap: Kerlix Roll 4.5x3.1 (in/yd) 1 x Per Week/30 Days Discharge Instructions: Apply Kerlix and Coban compression as directed. Compression Wrap: Coban Self-Adherent Wrap 4x5 (in/yd) 1 x Per Week/30 Days Discharge Instructions: Apply over Kerlix as directed. Compression Wrap: unna boot FIRST LAYER **** SEE INSTRUCTIONS 1 x Per Week/30 Days Discharge Instructions: APPLY FIRST LAYER UNNA BOOT AT BASES OF TOES AND JUST BELOW THE KNEE TO HOLD COMPRESSION WRAP IN PLACE. Electronic Signature(s) Signed: 02/26/2023 1:14:50 PM By: Karie Schwalbe RN Signed: 02/26/2023 4:37:54 PM By: Jamie Najjar MD Entered By: Karie Schwalbe on 02/26/2023 08:37:14 -------------------------------------------------------------------------------- Problem List Details Patient  Name: Date of Service: Jamie [name], [name] 02/26/2023 10:30 A M Medical Record Number: [id number] Patient Account Number: [id number] Date of Birth/Sex: Treating RN: 04/25/1940 (82 y.o. 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 05/22/2022 No Yes lower extremity L97.812 Non-pressure chronic ulcer of other part of right lower leg with fat layer 05/22/2022 No Yes exposed I25.10 Atherosclerotic heart disease of native coronary artery without angina pectoris 05/22/2022 No Yes Inactive Problems Resolved Problems Electronic Signature(s) Signed: 02/26/2023 4:37:54 PM By: Jamie Najjar MD Entered By: Jamie [name] on 02/26/2023 09:05:20 [name] ([id number]) [id number] Page 8 of 13 -------------------------------------------------------------------------------- Progress Note Details Patient Name: Date of Service: Jamie [name], [name]. 02/26/2023 10:30 A M Medical Record Number: [id number] Patient Account Number: [id number] Date of Birth/Sex: Treating RN: [birth year] (82 y.o. F) Primary Care Provider: Belva Agee Other Clinician: Referring Provider: Treating Provider/Extender: [name] Weeks in Treatment: 40 Subjective History of Present Illness (HPI) ADMISSION 06/30/2020 Mrs. Blick is an 82 year old woman who lives in [place]. She is here with her niece for review of wounds on the left medial lower leg and ankle. These have apparently been present for over a year and she followed with Dr. Olegario Messier at the [facility] in [place] for quite a period of time although it looks as though there was a initial consult wound from  Dr. Marcha Solders on February 18 presumably there was therefore hiatus. At that point the wounds were described as being there for 3 months. She also tells me she was at the wound care center in [place] for a period of time with this. 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 She had venous studies that were negative for DVT ABIs on the right were 1.01 on the left 1.06. She has . had previous applications of puraply, compression which she does not tolerate very  well. She has had several rounds of oral antibiotic therapy. She complains of unrelenting pain and she is seeing Dr. Reece Agar V of pain management apparently was on oxycodone but that did not help. She also had a skin biopsy done by Dr. Olegario Messier although we do not have that result. She does not appear to have an arterial issue. I am not completely clear what she has been putting on the wounds lately. 3/31; this patient has a particularly nasty set of wounds on the left medial ankle in the middle of what looks to be hemosiderin deposition secondary to chronic venous insufficiency. She has a lot of pain followed by pain management. Dr. Marcha Solders apparently did a biopsy of something on the left leg last fall what I would like to get this result. She has a history of MRSA treatment. I do not believe she had reflux studies but she did have DVT rule out studies. Previous ABIs have not suggested arterial insufficiency 4/7; difficult wounds on the left medial ankle probably chronic venous insufficiency. With considerable effort on behalf of our case manager we were able to finally to speak to somebody at the hospital in [place] who indicated that no biopsy of this area have been done even though the patient describes this in some detail and is even able to point out where she thinks the biopsy was done. We have been using Sorbact. The PCR culture I did showed polymicrobial identification with Pseudomonas, staph aureus,  Peptostreptococcus. All of this and low titers. Resistance genes identified were MRSA, staph virulence gene and tetracycline. We are going to send this to [facility] for a topical antibiotic which is something that we have had good success with recently in large venous ulcers with a lot of purulent drainage. There would not be an easy oral alternative here possibly line escalated and ciprofloxacin if we need to use systemic antibiotic 4/15; difficult area on the left medial ankle. Most likely chronic venous insufficiency. I think she will probably need venous reflux study I think she had DVT rule outs but not venous reflux studies. We have not yet obtained the topical antibiotics. She has home health changing the dressing we have been using Sorbact for adherent fibrinous debris on the surface. Very difficult to remove 4/22; patient presents for 1 week follow-up. She has been using sore back under compression wraps and these are changed 3 times a week with home health. She also had Keystone antibiotics sent to her house and brought them in today. She has no complaints or issues today. 5/2; patient is here for follow-up. She has been using Sorbact under compression. Very painful wound. She has been using Keystone antibiotics. Not much improvement although the more medial part of the wound has cleaned up nicely and the larger part of the wound about 50% slough covered. Part of the issue here is that she had stays at both [facility] wound care center, [facility] wound care center and now [place]. Not sure if she has had venous reflux studies. As far as we are able to tell she did not have a biopsy. My notes state that she did not have venous reflux studies just DVT rule outs. 5/16; patient goes for venous reflux studies this afternoon. She says that wound was biopsied which sounds like punch biopsies by Dr. Lynden Ang we do not have these results. We are using Sorbact. Very difficult wound to debride 5/23; patient  presents for 1 week follow-up. She reports tolerating the wraps well with sorbact  underneath. She had ABIs and venous reflux studies done. She has no issues or complaints today. She denies acute signs of infection. 6/6; I have reviewed the patient's vascular studies. Wounds are on the left medial and posterior calf. She had significant reflux in the greater saphenous vein in the in the mid thigh, distal thigh knee and the small saphenous vein in the popliteal fossa. The vein diameters do not look too impressive though. She is going to see the vascular surgeon on Wednesday. She also had venous reflux in the right common femoral vein. She did not have any evidence of a DVT or SVT I am wondering whether there is an ablation procedure that would benefit her in the greater saphenous vein on the left. . She tells [place] that home health put the dressing on too tight and she took off 1 layer. The swelling in her left leg is a little worse as a result of this. Not much change in the wound measurements so the surface of the wound looks better Her arterial studies showed an ABI on the right of 1.14 with a triphasic waveform and a great toe pressure of 0.89. On the left her ABIs were noncompressible at 1.34 but with triphasic waveforms and a TBI of 0.97. Her greater toe pressure was 122. There was no evidence of significant bilateral arterial disease 6/20 patient went to see Dr. Durwin Nora. He did not think she had significant arterial disease. In terms of her venous duplex on the right side there was no evidence of a DVT or SVT there was deep venous reflux involving the common femoral vein no superficial vein reflux on the left side there was no DVT or SVT there was no deep vein graft reflux there was some reflux in the greater saphenous vein from the mid thigh to the knee but the vein here was not dilated. He thought these were venous wounds he prescribed a compression pump but I am not sure who we ordered it from The  patient has been approved for Apligraf. Still using silver collagen this week 7/5; Apligraf #1 7/19 Apligraf #2. Decent improvement in the condition of the wound bed. Epithelialization distal 8/2 Apligraf #3. No issues or complaints. Denies signs of infection. 8/17 Apligraf #4. No issues or concerns. Some complaints of pruritus and the rash. Our intake nurse brought up the fact that she had previously indicated possible cotton layer sensitivity we will therefore use kerlix in the bottom layer of the compression 8/30; the patient comes in with the area on her left medial leg just about healed. There is a superficial area more towards the tibia and a smaller open area distally everything else is epithelialized. I do not think she requires another Apligraf 9/13; left medial leg is healed. 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 Her edema control is good Readmisstion: BRITTNY, SANROMAN ([id number]) [id number] Page 9 of 13 05-22-2022 upon evaluation today patient presents for initial inspection here in our clinic concerning issues that she has been having with a wound of the right lateral lower extremity. This is an area of a previous skin graft she tells me. With that being said she unfortunately had a scrape on this that occurred around 10 January. Since that time she has noted that this has just continued to get bigger and bigger in her niece who is present with her today actually states that 2 weeks ago when she saw that it was  significantly smaller than what she sees currently. Obviously this is of utmost concern as we do not want this to continue to get larger when arrested and get moving in the right direction. Fortunately there does not appear to be any signs of systemic infection though locally I think we probably do have some infection present. I would obtain a culture to see what we have going on here  and then we will subsequently see where things go going forward. Fortunately I think that she is in the right place at this point being at the wound center we can definitely do something to try to get this moving in the right direction. Patient does have a history of chronic venous insufficiency as well as coronary artery disease. In the past she has done well with compression wraps on the start with a 3 layer wrap I think she is probably can end up going to a 4-layer at some point but we will see how things do over the next week. She has been using wound cleanser along with she tells me a zinc and topical but again I am not sure exactly what that was. 05-29-2022 upon evaluation today patient appears to be doing well currently in regard to her wound. This actually is showing signs of improvement I am happy in that regard unfortunately her left leg has an area on the shin that has opened. This is the region where one of the areas at least we have previously taken care of. Nonetheless this is small hoping we get it under control before things worsen significantly. 06-05-2022 upon evaluation today patient appears to be doing well currently in regard to her wounds. The actually seem to be fairly clean she is having still quite a bit of problem with pain at this point she would like to not have debridement today for all possible. With that being said I do believe that we are making some good progress I think the [facility] is doing a decent job here. 3/6; patient has had 3/6; patient with known severe chronic venous insufficiency. She apparently had a traumatic wound at home and has a reasonably substantial wound on the right lateral lower leg and a smaller one on the left anterior lower leg as well. We have been using Hydrofera Blue and Unna boots 3/13; patient presents for follow-up. We have been using Hydrofera Blue under [employer] to the legs bilaterally. She has no issues or complaints today. 06-26-2022  upon evaluation today patient appears to be doing well currently in regard to her wound. This is measuring a little bit larger however. Fortunately I do not see any signs of infection this is on the right side. Fortunately the left side is actually completely healed which is great news. 07-03-2022 patient's wound unfortunately is continuing to show signs of being worse. Her compression which was the Tubigrip that I thought should be able to pull up actually slipped down and she was not able to pull that back up. With that being said that means that unfortunately her wound has continued to deteriorate since I last saw her. This is definitely not the direction that we are looking for. I discussed with her that I do believe we need to go ahead and see about getting her started on antibiotics and I subsequently would also like to go ahead and see about getting things moving forward with regard to a wound culture and making a change up her dressings as well but this can be changed  more frequently than just once a week. 07-10-2022 upon evaluation today patient actually appears to be doing much better. I do believe that the antibiotics have been beneficial for her. Fortunately I do not see any signs of active infection locally nor systemically which is great news. No fevers, chills, nausea, vomiting, or diarrhea. 07-17-2022 upon evaluation today patient appears to be doing well currently in regard to her wound which is actually showing signs of improvement this is slow but nonetheless prevalent that we are seeing good improvement here. Fortunately I do not see any signs of active infection locally nor systemically which is great news. 07-24-2022 upon evaluation today patient appears to be doing well currently in regard to her wound although the wrap that we had on has been slipping down and she really does not have anybody to help her with this at home health is not going to be coming out we could not get anybody  that would actually be able to do the dressing changes. Fortunately I do not see any signs of active infection locally nor systemically which is great news. 08-07-22 poorly in regard to her leg compared to what it was previous. Fortunately there does not appear to be any signs of active infection locally nor systemically which is great news. No fevers, chills, nausea, vomiting, or diarrhea. With that being said it does appear that the patient may have some cellulitis in the leg which is not good. 08-14-2022 upon evaluation today patient appears to be doing somewhat better in regard to her wounds she still is having quite a bit of pain we are still not where we want to be as far as healing is concerned completely. Fortunately I do not see any evidence of active infection locally nor systemically which is great news and I am very pleased in that regard. 08-21-23 upon evaluation today patient appears to be doing poorly currently in regard to her wound. She has been tolerating the dressing changes without complication. Fortunately there does not appear to be any signs of active infection locally nor systemically at this time. 08-28-2022 upon evaluation today patient appears to be doing poorly in regard to her wound this was not wrapped appropriately home health did not go up high enough on the wrap. This has caused some issues and I discussed that with the patient today. I do believe that she is going to require aggressive wrapping and treatment which she is getting the [facility] topical antibiotics today they can start using that at the next wrap on Friday. In the meantime they need to make sure that the rapid and appropriately we wrote very specific orders today. 09-04-2022 upon evaluation today patient appears to be doing well currently in regard to her wound which I think is making progress is still hurting her quite significantly. Fortunately I do not see any signs of active infection locally or systemically  which is great news. No fevers, chills, nausea, vomiting, or diarrhea. 09-11-2022 upon evaluation today patient's wound actually is showing signs of being a little bit smaller and looking a little bit better she still has a lot going on here however. Fortunately I do not see any evidence of active infection Worsening locally nor systemically which is great news. With that being said worsening still continue to use the topical Keystone antibiotics. 09-18-2022 upon evaluation today patient actually showing some signs of improvement. I am actually very pleased with where we stand compared to where we have been. I think that she is  making good headway here. She is still having quite a bit of pain but it seems to be lessening compared to previous. 09-25-2022 upon evaluation patient's wound actually showing signs slowly but surely of improvement. Fortunately I do not see any signs of active infection at this time systemically and locally I feel like this is dramatically improved. She is doing well with the [facility] topical antibiotics. 10-02-2022 upon evaluation today patient appears to be doing better in regard to her wound overall. I am very pleased with where things stand from a visual standpoint I do not see any signs of active infection and in general I think that we are moving in the right direction here. 10-09-2022 upon evaluation today patient appears to be doing well currently in regard to her wound. She has been tolerating the dressing changes without complication. Fortunately I do not see any signs of active infection at this time which is great news. 10-16-2022 upon evaluation today patient continues to have quite a bit of pain in regard to her right leg. We have been attempting to debride little by little as we could but again was pretty limited by the fact that she is having significant discomfort. We do not actually have any signs of active infection going on at this point that the [facility] topical  antibiotics have been helping quite readily. I been attempting to do as much debridement as I can but if she were to have pain medication this would actually help and in the past when she did it was much more tolerable for her as far as taking the edge off. Right now she has been without as she is in transition from her previous pain management physician to someone new to manage this. 10-23-2022 upon evaluation patient appears to be doing excellent in regard to her wound. She has been tolerating the dressing changes without complication. Fortunately I do not see any evidence of active infection locally nor systemically which is great news. No fevers, chills, nausea, vomiting, or diarrhea. 7/24; patient with a wound secondary to chronic venous insufficiency on the right lateral lower leg. We have been using [facility] antibiotic [address] KAILEAH, SINKLER ([id number]) [id number] Page 10 of 13 under kerlix Coban. She complains of a lot of pain after last week's debridement otherwise things appear to be improved per discussion with our intake nurse. 11-06-2022 upon evaluation today patient appears to be doing excellent at this point in regard to her wound. She has been tolerating the dressing changes without complication and to be honest she is making excellent progress towards complete closure. I am actually very pleased with where we stand I think that she is doing excellent as far as the overall appearance of the wound is concerned as well. She is going require some debridement however. 11-20-2022 upon evaluation today patient appears to be doing well currently in regard to her wound. She has been tolerating the dressing changes without complication. Fortunately there does not appear to be any signs of active infection locally or systemically which is great news and in general I do believe that we are moving in the right direction here. No fevers, chills, nausea,  vomiting, or diarrhea. 11-27-2022 upon evaluation today patient appears to be doing a little better in regards to the overall appearance of her wound but unfortunately she is having increased pain. The collagen seems to be getting very dry and stuck to the wound bed which is causing her some issues here. Fortunately I do not see  any signs of active infection locally nor systemically at this time. Fortunately I think that her wound is better but unfortunately her pain has not. For that reason I am going to avoid any debridement today since the patient is very upset about the pain and how bad this is hurting at this point. I think we can have to try something a little bit different I am thinking PolyMem may be a good option. 8/28; this patient has difficult venous wounds on the right lateral lower leg and ankle in the setting of chronic venous insufficiency. We have been using polymen under compression with kerlix Coban. 12-11-2022 upon evaluation today patient appears to be doing well currently in regard to her wound. She has been tolerating the dressing changes without complication and actually appears to be doing much better this week compared to when I saw her 2 weeks ago. The wound is measuring significantly smaller. We are using PolyMem along with Kerlix and Coban. 12-18-2022 upon evaluation today patient appears to be doing well currently in regard to her wound. This is actually showing signs of improvement is measuring a little bit smaller and looking better. Fortunately I do not see any evidence of worsening overall and I believe that we will making good headway with the PolyMem and the [facility] topical antibiotics. 12-25-2022 upon evaluation today patient appears to be doing well currently in regard to her wound. She has been tolerating the dressing changes without complication. Fortunately I do not see any evidence of infection locally or systemically which is great news and in general I do  believe that we will make an excellent headway towards complete closure also excellent news. 01-01-2023 upon evaluation patient's wound actually showing signs of improvement the wrap was actually put on properly this week and this is good news. Overall I am extremely happy with where things stand and how this appears today I do not see any signs of worsening overall and I believe that the patient is making excellent headway towards complete closure which is great news. 01/08/2023 upon evaluation today patient appears to be doing well currently in regard to her leg. She is actually draining much less unfortunately home health is just not doing very well getting the supplies necessary in order to continue to take care of the patient. They are now telling me they cannot get the PolyMem which is something that has been doing really well for her. That really does not make sense to me you came to get PolyMem for a hospice patient. Nonetheless either way I think we may need to just discontinue treatment with home health and just manage the patient here at the clinic. 01-15-2023 upon evaluation today patient appears to be doing well currently in regard to her wound. She has been tolerating the dressing changes without complication. Fortunately I do not see any signs of infection I think she is doing quite well and very pleased with where we stand today. No fevers, chills, nausea, vomiting, or diarrhea. 01-22-2023 upon evaluation today patient appears to be doing well currently in regard to her wound. She has been tolerating the dressing changes without complication and both wounds are measuring smaller today and look to be doing excellent. I am actually very pleased with what ever standing here and I think that she is making really good headway towards closure which is great news. 01-29-2023 upon evaluation today patient actually tells me she has been having some increased pain. Subsequently it took a little bit  of  drilling down but in the end I realized that we actually ended up putting a full Unna boot on her last week as opposed to just using a good anchor at the top and bottom. I think this is what wound up with her having increased discomfort because she tells me as she was doing well when she came in last time and by the time she left she tells me this was already getting significantly worse. And she hurt most of the week. With that being said I do believe that the patient is doing quite well at this point with regard to her wounds and the wrap did okay it was not a problem other than the increased pain I think we may benefit from a Urgo K2 light compression wrap as this will be much more comfortable I think compared to what we have been doing with just the Kerlix Coban anyway. 02-05-2023 upon evaluation today patient appears to be doing well currently in regard to her wounds. She has been tolerating the dressing changes without complication. Fortunately I do not see any signs of active infection looking systemically which is great news. No fevers, chills, nausea, vomiting, or diarrhea. 11/6; patient I remember from a previous stay in this clinic With a left lower extremity wound.. She has a right lower extremity venous wound. She has been using Keystone, polymen under [employer]. Her wound now is to remaining open areas. There is no evidence of infection although she is tender in the area. I wonder whether there is stasis dermatitis here. She reminds me that she had some form of skin graft on this area during his stay at the [facility] wound care center presumably in around 2022. 11/13; difficult wound on the right lateral lower extremity. She has a history of venous wounds in this area and a history of a skin graft in this area. She can planes about a lot of pain but she has not been systemically unwell. No fever chills etc. 11/20; continue difficult wounds on the right lateral lower extremity. She has a  history of venous wounds in this area and a history of skin graft in this area. She continues to complain of pain. She comes in today with completely nonviable surfaces. She has been using [employer] under and Urgo K2 light Objective Constitutional Sitting or standing Blood Pressure is within target range for patient.. Pulse regular and within target range for patient.Marland Kitchen Respirations regular, non-labored and within target range.. Temperature is normal and within the target range for the patient.Marland Kitchen Appears in no distress. Vitals Time Taken: 10:59 AM, Height: 62 in, Weight: 171 lbs, BMI: 31.3, Temperature: 98 F, Pulse: 62 bpm, Respiratory Rate: 18 breaths/min, Blood Pressure: 131/74 mmHg. General Notes: Wound exam; 2 open areas that were originally part of the single open wound. Completely nonviable surface in both areas. I attempted debridement with a #5 curette I was able to get some debris off the surface but she simply does not tolerate this very well. Her edema control is moderate ALBENA, CRIGER ([id number]) [id number] Page 11 of 13 Integumentary (Hair, Skin) Wound #5 status is Open. Original cause of wound was Trauma. The date acquired was: 04/17/2022. The wound has been in treatment 40 weeks. The wound is located on the Right,Lateral Lower Leg. The wound measures 2.3cm length x 0.8cm width x 0.1cm depth; 1.445cm^2 area and 0.145cm^3 volume. There is Fat Layer (Subcutaneous Tissue) exposed. There is no tunneling or undermining noted. There is a medium amount  of serosanguineous drainage noted. The wound margin is distinct with the outline attached to the wound base. There is medium (34-66%) red granulation within the wound bed. There is a medium (34-66%) amount of necrotic tissue within the wound bed including Eschar and Adherent Slough. The periwound skin appearance exhibited: Scarring, Dry/Scaly, Hemosiderin Staining. The periwound skin appearance did not  exhibit: Callus, Crepitus, Excoriation, Induration, Rash, Maceration, Atrophie Blanche, Cyanosis, Ecchymosis, Mottled, Pallor, Rubor, Erythema. Periwound temperature was noted as No Abnormality. The periwound has tenderness on palpation. Wound #7 status is Open. Original cause of wound was Gradually Appeared. The date acquired was: 01/01/2023. The wound has been in treatment 8 weeks. The wound is located on the Right,Posterior Lower Leg. The wound measures 4cm length x 4cm width x 0.1cm depth; 12.566cm^2 area and 1.257cm^3 volume. There is Fat Layer (Subcutaneous Tissue) exposed. There is no tunneling or undermining noted. There is a medium amount of serosanguineous drainage noted. The wound margin is distinct with the outline attached to the wound base. There is large (67-100%) red, pink granulation within the wound bed. There is a small (1-33%) amount of necrotic tissue within the wound bed including Eschar and Adherent Slough. The periwound skin appearance exhibited: Scarring, Hemosiderin Staining. The periwound skin appearance did not exhibit: Callus, Crepitus, Excoriation, Induration, Rash, Dry/Scaly, Maceration, Atrophie Blanche, Cyanosis, Ecchymosis, Mottled, Pallor, Rubor, Erythema. Periwound temperature was noted as No Abnormality. The periwound has tenderness on palpation. 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 There was a Selective/Open Wound Non-Viable Tissue Debridement with a total area of 1.44 sq cm performed by Maxwell Caul., MD. With the  following instrument(s): Curette to remove Non-Viable tissue/material. Material removed includes [facility] after achieving pain control using Lidocaine 5% topical ointment. No specimens were taken. A time out was conducted at 11:28, prior to the start of the procedure. A Minimum amount of bleeding was controlled with Pressure. The procedure was tolerated well with a pain level of 0 throughout and a pain level of 0 following the procedure. Post Debridement Measurements: 2.3cm length x 0.8cm width x 0.1cm depth; 0.145cm^3 volume. Character of Wound/Ulcer Post Debridement is improved. Severity of Tissue Post Debridement is: Fat layer exposed. Post procedure Diagnosis Wound #5: Same as Pre-Procedure Wound #7 Pre-procedure diagnosis of Wound #7 is a Venous Leg Ulcer located on the Right,Posterior Lower Leg .Severity of Tissue Pre Debridement is: Fat layer exposed. There was a Selective/Open Wound Non-Viable Tissue Debridement with a total area of 12.56 sq cm performed by Maxwell Caul., MD. With the following instrument(s): Curette to remove Non-Viable tissue/material. Material removed includes [facility] after achieving pain control using Lidocaine 5% topical ointment. No specimens were taken. A time out was conducted at 11:28, prior to the start of the procedure. A Minimum amount of bleeding was controlled with Pressure. The procedure was tolerated well with a pain level of 0 throughout and a pain level of 0 following the procedure. Post Debridement Measurements: 4cm length x 4cm width x 0.1cm depth; 1.257cm^3 volume. Character of Wound/Ulcer Post Debridement is improved. Severity of Tissue Post Debridement is: Fat layer exposed. Post procedure Diagnosis Wound #7: Same as  Pre-Procedure Plan Follow-up Appointments: Return Appointment in 1 week. - Dr. Leanord Hawking Wednesday (already scheduled)++++ 03/05/2023 10:30am NURSE VISIT++++ Return Appointment in 2 weeks. - Dr. Leanord Hawking Wednesday 03/12/23 at 10:30am Nurse  Visit: - 03/05/2023 (already scheduled) 1030 ****change Wound Care encounter to nurse visit***** Anesthetic: Wound #5 Right,Lateral Lower Leg: (In clinic) Topical Lidocaine 4% applied to wound bed Bathing/ Shower/ Hygiene: May shower and wash wound with soap and water. - with dressing changes. Edema Control - Orders / Instructions: Elevate legs to the level of the heart or above for 30 minutes daily and/or when sitting for 3-4 times a day throughout the day. Avoid standing for long periods of time. Exercise regularly - As tolerated WOUND #5: - Lower Leg Wound Laterality: Right, Lateral Cleanser: Soap and Water 1 x Per Week/30 Days Discharge Instructions: May shower and wash wound with dial antibacterial soap and water prior to dressing change. Peri-Wound Care: Sween Lotion (Moisturizing lotion) 1 x Per Week/30 Days Discharge Instructions: Apply moisturizing lotion as directed Topical: Skintegrity Hydrogel 4 (oz) 1 x Per Week/30 Days Discharge Instructions: Apply hydrogel with sorbact Topical: compounding topical antibiotics 1 x Per Week/30 Days Discharge Instructions: apply Keystone directly to wound bed under the hydrofera blue. Home Health start. MIX ACCORDING TO THE PHARMACY INSTRUCTIONS. Prim Dressing: Cutimed Sorbact Swab 1 x Per Week/30 Days JAQUA, KOPPLIN ([id number]) [id number] Page 12 of 13 Discharge Instructions: Apply to wound bed with Hydrogel Secondary Dressing: ABD Pad, 8x10 1 x Per Week/30 Days Discharge Instructions: Apply over primary dressing as directed. Secondary Dressing: Woven Gauze Sponge, Non-Sterile 4x4 in 1 x Per Week/30 Days Discharge Instructions: Apply over primary dressing as directed. Secondary Dressing: Zetuvit Plus 4x8 in 1 x Per Week/30 Days Discharge Instructions: Or double ABD pad Apply over primary dressing as directed. Com pression Wrap: Kerlix Roll 4.5x3.1 (in/yd) 1 x Per Week/30 Days Discharge Instructions:  Apply Kerlix and Coban compression as directed. Com pression Wrap: Coban Self-Adherent Wrap 4x5 (in/yd) 1 x Per Week/30 Days Discharge Instructions: Apply over Kerlix as directed. Com pression Wrap: unna boot FIRST LAYER **** SEE INSTRUCTIONS 1 x Per Week/30 Days Discharge Instructions: APPLY FIRST LAYER UNNA BOOT AT BASES OF TOES AND JUST BELOW THE KNEE TO HOLD COMPRESSION WRAP IN PLACE. WOUND #7: - Lower Leg Wound Laterality: Right, Posterior Cleanser: Soap and Water 1 x Per Week/30 Days Discharge Instructions: May shower and wash wound with dial antibacterial soap and water prior to dressing change. Peri-Wound Care: Sween Lotion (Moisturizing lotion) 1 x Per Week/30 Days Discharge Instructions: Apply moisturizing lotion as directed Topical: Skintegrity Hydrogel 4 (oz) 1 x Per Week/30 Days Discharge Instructions: Apply hydrogel with sorbact Topical: compounding topical antibiotics 1 x Per Week/30 Days Discharge Instructions: apply Keystone directly to wound bed under the hydrofera blue. Home Health start. MIX ACCORDING TO THE PHARMACY INSTRUCTIONS. Prim Dressing: Cutimed Sorbact Swab 1 x Per Week/30 Days ary Discharge Instructions: Apply to wound bed with Hydrogel Secondary Dressing: ABD Pad, 8x10 1 x Per Week/30 Days Discharge Instructions: Apply over primary dressing as directed. Secondary Dressing: Woven Gauze Sponge, Non-Sterile 4x4 in 1 x Per Week/30 Days Discharge Instructions: Apply over primary dressing as directed. Secondary Dressing: Zetuvit Plus 4x8 in 1 x Per Week/30 Days Discharge Instructions: Or double ABD pad Apply over primary dressing as directed. Com pression Wrap: Kerlix Roll 4.5x3.1 (in/yd) 1 x Per Week/30 Days Discharge Instructions: Apply Kerlix and Coban compression as directed. Com pression Wrap: Coban Self-Adherent Wrap 4x5 (in/yd) 1 x Per  Week/30 Days Discharge Instructions: Apply over Kerlix as directed. Com pression Wrap: unna boot FIRST LAYER **** SEE  INSTRUCTIONS 1 x Per Week/30 Days Discharge Instructions: APPLY FIRST LAYER UNNA BOOT AT BASES OF TOES AND JUST BELOW THE KNEE TO HOLD COMPRESSION WRAP IN PLACE. 1. I change the dressing to Sorbact ABD, Kerlix and Coban. 2. I reduced the compression to kerlix Coban because of her complaints of discomfort 3. I see no evidence of infection 4. Unfortunately these wounds require debridement she simply does not tolerate it Electronic Signature(s) Signed: 02/26/2023 4:37:54 PM By: Jamie Najjar MD Entered By: Jamie [name] on 02/26/2023 09:10:10 -------------------------------------------------------------------------------- SuperBill Details Patient Name: Date of Service: Jamie [name], Christean Grief. 02/26/2023 Medical Record Number: [id number] Patient Account Number: [id number] Date of Birth/Sex: Treating RN: 10-18-1940 (82 y.o. F) Primary Care Provider: Belva Agee Other Clinician: Referring Provider: Treating Provider/Extender: Vivia Budge in Treatment: 40 Diagnosis Coding ICD-10 Codes Code Description [phone] Chronic venous hypertension (idiopathic) with ulcer and inflammation of right lower extremity 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d I25.10 Atherosclerotic heart disease of native coronary artery without angina pectoris Facility Procedures : SIDNE, LIQUORI Code: [id number] Lysle Rubens [phone] ICD-10 Description: 97597 - DEBRIDE WOUND 1ST 20 SQ CM OR < 66440) [phone] Diagnosis Description L97.812 Non-pressure chronic ulcer of other part of right lower leg with fat layer expo Modifier: [id number]_5 sed Quantity: 1 1227.pdf Page 13 of 13 Physician Procedures : CPT4 Code Description Modifier [id number] 97597 - WC PHYS DEBR WO ANESTH 20 SQ CM ICD-10 Diagnosis Description 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d Quantity: 1 Electronic  Signature(s) Signed: 02/26/2023 4:37:54 PM By: Jamie Najjar MD Entered By: Jamie [name] on 02/26/2023 09:10:25

## 2023-02-28 NOTE — Progress Notes (Signed)
Jamie [name], Jamie [name] ([id number]) [id number] Page 1 of 9 Visit Report for 02/26/2023 Arrival Information Details Patient Name: Date of Service: Jamie [name], [name]. 02/26/2023 10:30 A M Medical Record Number: [id number] Patient Account Number: [id number] Date of Birth/Sex: Treating RN: 01-16-1941 (82 y.o. F) Primary Care Jaeda Bruso: Belva Agee Other Clinician: Referring Daegen Berrocal: Treating Sedona Wenk/Extender: Vivia Budge in Treatment: 40 Visit Information History Since Last Visit Added or deleted any medications: No Patient Arrived: Wheel Chair Any new allergies or adverse reactions: No Arrival Time: 10:58 Had a fall or experienced change in No Accompanied By: niece activities of daily living that may affect Transfer Assistance: None risk of falls: Patient Identification Verified: Yes Signs or symptoms of abuse/neglect since last visito No Secondary Verification Process Completed: Yes Hospitalized since last visit: No Patient Requires Transmission-Based Precautions: No Implantable device outside of the clinic excluding No Patient Has Alerts: No cellular tissue based products placed in the center since last visit: Has Dressing in Place as Prescribed: Yes Has Compression in Place as Prescribed: Yes Pain Present Now: Yes Electronic Signature(s) Signed: 02/28/2023 11:01:27 AM By: Thayer Dallas Entered By: Thayer Dallas on 02/26/2023 07:59:31 -------------------------------------------------------------------------------- Encounter Discharge Information Details Patient Name: Date of Service: Jamie [name], Jamie [name] 02/26/2023 10:30 A M Medical Record Number: [id number] Patient Account Number: [id number] Date of Birth/Sex: Treating RN: July 17, 1940 (82 y.o. Jamie [name] Primary Care Momen Ham: Belva Agee Other Clinician: Referring Brilee Port: Treating Glorianne Proctor/Extender: Vivia Budge in Treatment: 40 Encounter Discharge Information Items Post Procedure Vitals Discharge Condition: Stable Temperature (F): 98 Ambulatory Status: Wheelchair Pulse (bpm): 62 Discharge Destination: Home Respiratory Rate (breaths/min): 18 Transportation: Private Auto Blood Pressure (mmHg): 131/74 Accompanied By: Daughter Schedule Follow-up Appointment: Yes Clinical Summary of Care: Patient Declined Electronic Signature(s) Signed: 02/26/2023 1:14:50 PM By: Karie Schwalbe RN Entered By: Karie Schwalbe on 02/26/2023 10:11:48 [name] ([id number]) [phone].pdf Page 2 of 9 -------------------------------------------------------------------------------- Lower Extremity Assessment Details Patient Name: Date of Service: Jamie [name], [name]. 02/26/2023 10:30 A M Medical Record Number: [id number] Patient Account Number: [id number] Date of Birth/Sex: Treating RN: [birth year] (82 y.o. F) Primary Care Jamie [name]: Belva Agee Other Clinician: Referring Jamie [name]: Treating [name]/Extender: Jamie [name] Weeks in Treatment: 40 Edema Assessment Assessed: [name]: No] [Right: No] Edema: [Left: Ye] [Right: s] Calf Left: Right: Point of Measurement: From Medial Instep 35.8 cm Ankle Left: Right: Point of Measurement: From Medial Instep 24 cm Vascular Assessment Extremity colors, hair growth, and conditions: Extremity Color: [Right:Normal] Hair Growth on Extremity: [Right:No] Temperature of Extremity: [Right:Warm] Capillary Refill: [Right:< 3 seconds] Dependent Rubor: [Right:No No] Toe Nail Assessment Left: Right: Thick: Yes Discolored: Yes Deformed: No Improper Length and Hygiene: Yes Electronic Signature(s) Signed: 02/28/2023 11:01:27 AM By: Thayer Dallas Entered By: Thayer Dallas on 02/26/2023 08:01:22 -------------------------------------------------------------------------------- Multi Wound Chart  Details Patient Name: Date of Service: Jamie [name], Jamie [name] 02/26/2023 10:30 A M Medical Record Number: [id number] Patient Account Number: [id number] Date of Birth/Sex: Treating RN: Feb 12, 1941 (82 y.o. 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 N/A Primary Etiology: Sleep Apnea, Hypertension, Peripheral Sleep Apnea,  Hypertension, Peripheral N/A Comorbid History: Venous Disease, Osteoarthritis, Venous Disease, Osteoarthritis, Neuropathy Neuropathy 04/17/2022 01/01/2023 N/A Date Acquired: 40 8 N/A Weeks of Treatment: Open Open N/A Wound Status: No No N/A Wound Recurrence: Yes No N/A Clustered Wound: 1 N/A N/A Clustered Quantity: 2.3x0.8x0.1 4x4x0.1 N/A Measurements L x W x D (cm) 1.445 12.566 N/A A (cm) : rea 0.145 1.257 N/A Volume (cm) : 94.50% -6.90% N/A % Reduction in A rea: 98.20% 46.50% N/A % Reduction in Volume: Full Thickness Without Exposed Full Thickness Without Exposed N/A Classification: Support Structures Support Structures Medium Medium N/A Exudate A mount: Serosanguineous Serosanguineous N/A Exudate Type: red, brown red, brown N/A Exudate Color: Distinct, outline attached Distinct, outline attached N/A Wound Margin: Medium (34-66%) Large (67-100%) N/A Granulation A mount: Red Red, Pink N/A Granulation Quality: Medium (34-66%) Small (1-33%) N/A Necrotic A mount: Eschar, Adherent Slough Eschar,  Adherent Slough N/A Necrotic Tissue: Fat Layer (Subcutaneous Tissue): Yes Fat Layer (Subcutaneous Tissue): Yes N/A Exposed Structures: Fascia: No Fascia: No Tendon: No Tendon: No Muscle: No Muscle: No Joint: No Joint: No Bone: No Bone: No Large (67-100%) Small (1-33%) N/A Epithelialization: Debridement - Selective/Open Wound Debridement - Selective/Open Wound N/A Debridement: Pre-procedure Verification/Time Out 11:28 11:28 N/A Taken: Lidocaine 5% topical ointment Lidocaine 5% topical ointment N/A Pain Control: [employer] N/A Tissue Debrided: Non-Viable Tissue Non-Viable Tissue N/A Level: 1.44 12.56 N/A Debridement A (sq cm): rea Curette Curette N/A Instrument: Minimum Minimum N/A Bleeding: Pressure Pressure N/A Hemostasis A chieved: 0 0 N/A Procedural Pain: 0 0 N/A Post Procedural Pain: Procedure was tolerated well Procedure was tolerated well N/A Debridement Treatment Response: 2.3x0.8x0.1 4x4x0.1 N/A Post Debridement Measurements L x W x D (cm) 0.145 1.257 N/A Post Debridement Volume: (cm) Scarring: Yes Scarring: Yes N/A Periwound Skin Texture: Excoriation: No Excoriation: No Induration: No Induration: No Callus: No Callus: No Crepitus: No Crepitus: No Rash: No Rash: No Dry/Scaly: Yes Maceration: No N/A Periwound Skin Moisture: Maceration: No Dry/Scaly: No Hemosiderin Staining: Yes Hemosiderin Staining: Yes N/A Periwound Skin Color: Atrophie Blanche: No Atrophie Blanche: No Cyanosis: No Cyanosis: No Ecchymosis: No Ecchymosis: No Erythema: No Erythema: No Mottled: No Mottled: No Pallor: No Pallor: No Rubor: No Rubor: No No Abnormality No Abnormality N/A Temperature: Yes Yes N/A Tenderness on Palpation: Debridement Debridement N/A Procedures Performed: Treatment Notes YASMINA, GEVORKYAN ([id number]) [phone].pdf Page 4 of 9 Electronic Signature(s) Signed: 02/26/2023 4:37:54 PM By: [name]  MD Entered By: [name] on 02/26/2023 09:05:56 -------------------------------------------------------------------------------- Multi-Disciplinary Care Plan Details Patient Name: Date of Service: Jamie Brink, MA [name] 02/26/2023 10:30 A M Medical Record Number: [id number] Patient Account Number: [id number] Date of Birth/Sex: Treating RN: November 03, 1940 (82 y.o. Jamie [name] Primary Care Khristi Schiller: Belva Agee Other Clinician: Referring Bonita Brindisi: Treating Mearl Olver/Extender: Vivia Budge in Treatment: 40 Multidisciplinary Care Plan reviewed with physician Active Inactive Pain, Acute or Chronic Nursing Diagnoses: Pain Management - Cyclic Acute (Dressing Change Related) Pain Management - Non-cyclic Acute (Procedural) Pain, acute or chronic: actual or potential Goals: Patient will verbalize adequate pain control and receive pain control interventions during procedures as needed Date Initiated: 09/11/2022 Target Resolution Date: 05/09/2023 Goal Status: Active Patient/caregiver will verbalize adequate pain control between visits Date Initiated: 09/11/2022 Target Resolution Date: 05/09/2023 Goal Status: Active Patient/caregiver will verbalize comfort level met Date Initiated: 09/11/2022 Target Resolution Date: 05/09/2023 Goal Status: Active Interventions: Complete pain assessment as per visit requirements Encourage patient to take pain medications as prescribed Provide education on pain management Provision of support: recognize  patient pain, provide comfort and support as needed Reposition patient for comfort Treatment Activities: Administer pain control measures as ordered : 09/11/2022 Notes: Electronic Signature(s) Signed: 02/26/2023 1:14:50 PM By: Karie Schwalbe RN Entered By: Karie Schwalbe on 02/26/2023 10:10:11 -------------------------------------------------------------------------------- Pain Assessment Details Patient Name: Date of  Service: Gevena Mart W. 02/26/2023 10:30 A M Medical Record Number: [id number] Patient Account Number: [id number] Date of Birth/Sex: Treating RN: 1940/06/07 (82 y.o. F) Primary Care Quanah Majka: Belva Agee Other Clinician: CADIA, COBO ([id number]) [id number] Page 5 of 9 Referring Conlee Sliter: Treating Ashaad Gaertner/Extender: Vivia Budge in Treatment: 40 Active Problems Location of Pain Severity and Description of Pain Patient Has Paino Yes Site Locations Rate the pain. Current Pain Level: 8 Pain Management and Medication Current Pain Management: Electronic Signature(s) Signed: 02/28/2023 11:01:27 AM By: Thayer Dallas Entered By: Thayer Dallas on 02/26/2023 08:00:32 -------------------------------------------------------------------------------- Patient/Caregiver Education Details Patient Name: Date of Service: [name] 11/20/2024andnbsp10:30 A M Medical Record Number: [id number] Patient Account Number: [id number] Date of Birth/Gender: Treating RN: 08-16-1940 (82 y.o. Jamie [name] Primary Care Physician: Belva Agee Other Clinician: Referring Physician: Treating Physician/Extender: Vivia Budge in Treatment: 40 Education Assessment Education Provided To: Patient Education Topics Provided Wound/Skin Impairment: Methods: Explain/Verbal Responses: State content correctly [employer]) Signed: 02/26/2023 1:14:50 PM By: Karie Schwalbe RN Entered By: Karie Schwalbe on 02/26/2023 10:10:30 [name] ([id number]) [phone].pdf Page 6 of 9 -------------------------------------------------------------------------------- Wound Assessment Details Patient Name: Date of Service: Jamie [name], [name]. 02/26/2023 10:30 A M Medical Record Number: [id number] Patient Account Number: [id number] Date of Birth/Sex:  Treating RN: 1940-12-18 (82 y.o. Jamie [name] Primary Care Tequila Rottmann: Belva Agee Other Clinician: Referring Carmel Garfield: Treating Tashawna Thom/Extender: Jamie [name] Weeks in Treatment: 40 Wound Status Wound Number: 5 Primary Venous Leg Ulcer Etiology: Wound Location: Right, Lateral Lower Leg Wound Open Wounding Event: Trauma Status: Date Acquired: 04/17/2022 Comorbid Sleep Apnea, Hypertension, Peripheral Venous Disease, Weeks Of Treatment: 40 History: Osteoarthritis, Neuropathy Clustered Wound: Yes Photos Wound Measurements Length: (cm) Width: (cm) Depth: (cm) Clustered Quantity: Area: (cm) Volume: (cm) 2.3 % Reduction in Area: 94.5% 0.8 % Reduction in Volume: 98.2% 0.1 Epithelialization: Large (67-100%) 1 Tunneling: No 1.445 Undermining: No 0.145 Wound Description Classification: Full Thickness Without Exposed Supp Wound Margin: Distinct, outline attached Exudate Amount: Medium Exudate Type: Serosanguineous Exudate Color: red, brown ort Structures Foul Odor After Cleansing: No Slough/Fibrino Yes Wound Bed Granulation Amount: Medium (34-66%) Exposed Structure Granulation Quality: Red Fascia Exposed: No Necrotic Amount: Medium (34-66%) Fat Layer (Subcutaneous Tissue) Exposed: Yes Necrotic Quality: Eschar, Adherent Slough Tendon Exposed: No Muscle Exposed: No Joint Exposed: No Bone Exposed: No Periwound Skin Texture Texture Color No Abnormalities Noted: No No Abnormalities Noted: No Callus: No Atrophie Blanche: No Crepitus: No Cyanosis: No Excoriation: No Ecchymosis: No Induration: No Erythema: No Rash: No Hemosiderin Staining: Yes Scarring: Yes Mottled: No Pallor: No Moisture Rubor: No No Abnormalities Noted: No Dry / Scaly: Yes Temperature / Pain Maceration: No Temperature: No Abnormality TYLAR, OLLIVIERRE ([id number]) [id number] Page 7 of 9 Tenderness on Palpation: Yes Treatment  Notes Wound #5 (Lower Leg) Wound Laterality: Right, Lateral Cleanser Soap and Water Discharge Instruction: May shower and wash wound with dial antibacterial soap and water prior to dressing change. Peri-Wound Care Sween Lotion (Moisturizing lotion) Discharge Instruction: Apply moisturizing lotion as directed Topical Skintegrity Hydrogel 4 (oz) Discharge Instruction: Apply hydrogel with sorbact compounding topical antibiotics Discharge Instruction: apply Keystone directly to wound bed under the  hydrofera blue. Home Health start. MIX ACCORDING TO THE PHARMACY INSTRUCTIONS. Primary Dressing Cutimed Sorbact Swab Discharge Instruction: Apply to wound bed with Hydrogel Secondary Dressing ABD Pad, 8x10 Discharge Instruction: Apply over primary dressing as directed. Woven Gauze Sponge, Non-Sterile 4x4 in Discharge Instruction: Apply over primary dressing as directed. Zetuvit Plus 4x8 in Discharge Instruction: Or double ABD pad Apply over primary dressing as directed. Secured With Compression Wrap Kerlix Roll 4.5x3.1 (in/yd) Discharge Instruction: Apply Kerlix and Coban compression as directed. Coban Self-Adherent Wrap 4x5 (in/yd) Discharge Instruction: Apply over Kerlix as directed. unna boot FIRST LAYER **** SEE INSTRUCTIONS Discharge Instruction: APPLY FIRST LAYER UNNA BOOT AT BASES OF TOES AND JUST BELOW THE KNEE TO HOLD COMPRESSION WRAP IN PLACE. Compression Stockings Add-Ons Electronic Signature(s) Signed: 02/26/2023 1:14:50 PM By: Karie Schwalbe RN Entered By: Karie Schwalbe on 02/26/2023 08:08:56 -------------------------------------------------------------------------------- Wound Assessment Details Patient Name: Date of Service: Jamie [name], Jamie [name] 02/26/2023 10:30 A M Medical Record Number: [id number] Patient Account Number: [id number] Date of Birth/Sex: Treating RN: 10/28/1940 (82 y.o. Jamie [name] Primary Care Elysse Polidore: Belva Agee Other  Clinician: Referring Callaway Hardigree: Treating Cyan Moultrie/Extender: Jamie [name] Weeks in Treatment: 40 Wound Status Wound Number: 7 Primary Venous Leg Ulcer Etiology: Wound Location: Right, Posterior Lower Leg Wound Open Wounding Event: Gradually Appeared Status: ADLEMI, SKIPWITH ([id number]) [phone].pdf Page 8 of 9 Status: Date Acquired: 01/01/2023 Comorbid Sleep Apnea, Hypertension, Peripheral Venous Disease, Weeks Of Treatment: 8 History: Osteoarthritis, Neuropathy Clustered Wound: No Photos Wound Measurements Length: (cm) 4 Width: (cm) 4 Depth: (cm) 0.1 Area: (cm) 12.566 Volume: (cm) 1.257 % Reduction in Area: -6.9% % Reduction in Volume: 46.5% Epithelialization: Small (1-33%) Tunneling: No Undermining: No Wound Description Classification: Full Thickness Without Exposed Support Structures Wound Margin: Distinct, outline attached Exudate Amount: Medium Exudate Type: Serosanguineous Exudate Color: red, brown Foul Odor After Cleansing: No Slough/Fibrino Yes Wound Bed Granulation Amount: Large (67-100%) Exposed Structure Granulation Quality: Red, Pink Fascia Exposed: No Necrotic Amount: Small (1-33%) Fat Layer (Subcutaneous Tissue) Exposed: Yes Necrotic Quality: Eschar, Adherent Slough Tendon Exposed: No Muscle Exposed: No Joint Exposed: No Bone Exposed: No Periwound Skin Texture Texture Color No Abnormalities Noted: No No Abnormalities Noted: No Callus: No Atrophie Blanche: No Crepitus: No Cyanosis: No Excoriation: No Ecchymosis: No Induration: No Erythema: No Rash: No Hemosiderin Staining: Yes Scarring: Yes Mottled: No Pallor: No Moisture Rubor: No No Abnormalities Noted: No Dry / Scaly: No Temperature / Pain Maceration: No Temperature: No Abnormality Tenderness on Palpation: Yes Treatment Notes Wound #7 (Lower Leg) Wound Laterality: Right, Posterior Cleanser Soap and Water Discharge  Instruction: May shower and wash wound with dial antibacterial soap and water prior to dressing change. Peri-Wound Care Sween Lotion (Moisturizing lotion) Discharge Instruction: Apply moisturizing lotion as directed Topical Skintegrity Hydrogel 4 (oz) Discharge Instruction: Apply hydrogel with sorbact compounding topical antibiotics Discharge Instruction: apply Keystone directly to wound bed under the hydrofera blue. Home Health start. MIX ACCORDING TO THE KYNZEE, TERZIAN ([id number]) [id number] Page 9 of 9 PHARMACY INSTRUCTIONS. Primary Dressing Cutimed Sorbact Swab Discharge Instruction: Apply to wound bed with Hydrogel Secondary Dressing ABD Pad, 8x10 Discharge Instruction: Apply over primary dressing as directed. Woven Gauze Sponge, Non-Sterile 4x4 in Discharge Instruction: Apply over primary dressing as directed. Zetuvit Plus 4x8 in Discharge Instruction: Or double ABD pad Apply over primary dressing as directed. Secured With Compression Wrap Kerlix Roll 4.5x3.1 (in/yd) Discharge Instruction: Apply Kerlix and Coban compression as directed. Coban Self-Adherent Wrap 4x5 (in/yd) Discharge Instruction: Apply over Kerlix as  directed. unna boot FIRST LAYER **** SEE INSTRUCTIONS Discharge Instruction: APPLY FIRST LAYER UNNA BOOT AT BASES OF TOES AND JUST BELOW THE KNEE TO HOLD COMPRESSION WRAP IN PLACE. Compression Stockings Add-Ons Electronic Signature(s) Signed: 02/26/2023 1:14:50 PM By: Karie Schwalbe RN Entered By: Karie Schwalbe on 02/26/2023 08:33:41 -------------------------------------------------------------------------------- Vitals Details Patient Name: Date of Service: Jamie Brink, MA [name] 02/26/2023 10:30 A M Medical Record Number: [id number] Patient Account Number: [id number] Date of Birth/Sex: Treating RN: July 25, 1940 (82 y.o. F) Primary Care Jinan Biggins: Belva Agee Other Clinician: Referring Nayleen Janosik: Treating Nayshawn Mesta/Extender:  Vivia Budge in Treatment: 40 Vital Signs Time Taken: 10:59 Temperature (F): 98 Height (in): 62 Pulse (bpm): 62 Weight (lbs): 171 Respiratory Rate (breaths/min): 18 Body Mass Index (BMI): 31.3 Blood Pressure (mmHg): 131/74 Reference Range: 80 - 120 mg / dl Electronic Signature(s) Signed: 02/28/2023 11:01:27 AM By: Thayer Dallas Entered By: Thayer Dallas on 02/26/2023 07:59:55

## 2023-03-05 ENCOUNTER — Encounter (HOSPITAL_BASED_OUTPATIENT_CLINIC_OR_DEPARTMENT_OTHER): Payer: Medicare HMO | Admitting: Physician Assistant

## 2023-03-05 DIAGNOSIS — L97812 Non-pressure chronic ulcer of other part of right lower leg with fat layer exposed: Secondary | ICD-10-CM | POA: Diagnosis not present

## 2023-03-05 NOTE — Progress Notes (Signed)
Jamie [name], Jamie [name] ([id number]) [id number] Page 1 of 6 Visit Report for 03/05/2023 Arrival Information Details Patient Name: Date of Service: Jamie [name], [name]. 03/05/2023 10:30 A M Medical Record Number: [id number] Patient Account Number: [id number] Date of Birth/Sex: Treating RN: [birth year] (82 y.o. F) Primary Care Jamie [name]: Jamie [name] Other Clinician: Thayer [name] Referring Jamie [name]: Treating Jamie [name]/Extender: Jamie [name], Jamie [name] in Treatment: 58 Visit Information History Since Last Visit Added or deleted any medications: No Patient Arrived: Wheel Chair Any new allergies or adverse reactions: No Arrival Time: 09:50 Had a fall or experienced change in No Accompanied By: niece activities of daily living that may affect Transfer Assistance: None risk of falls: Patient Identification Verified: Yes Signs or symptoms of abuse/neglect since last visito No Secondary Verification Process Completed: Yes Hospitalized since last visit: No Patient Requires Transmission-Based Precautions: No Implantable device outside of the clinic excluding No Patient Has Alerts: No cellular tissue based products placed in the center since last visit: Has Dressing in Place as Prescribed: Yes Has Compression in Place as Prescribed: Yes Pain Present Now: Yes Electronic Signature(s) Signed: 03/05/2023 3:03:06 PM By: Jamie [name] Entered By: Jamie [name] on 03/05/2023 06:56:32 -------------------------------------------------------------------------------- Clinic Level of Care Assessment Details Patient Name: Date of Service: Jamie [name], [name]. 03/05/2023 10:30 A M Medical Record Number: [id number] Patient Account Number: [id number] Date of Birth/Sex: Treating RN: Jamie [name] (82 y.o. F) Primary Care Raniya Golembeski: Jamie [name] Other Clinician: Thayer [name] Referring Anahis Furgeson: Treating Kamdin Follett/Extender: Jamie [name], Jamie [name] in Treatment: 83 Clinic Level of Care Assessment Items TOOL 4 Quantity Score X- 1 0 Use when only an EandM is performed on FOLLOW-UP visit ASSESSMENTS - Nursing Assessment / Reassessment X- 1 10 Reassessment of Co-morbidities (includes updates in patient status) X- 1 5 Reassessment of Adherence to Treatment Plan ASSESSMENTS - Wound and Skin A ssessment / Reassessment []  - 0 Simple Wound Assessment / Reassessment - one wound []  - 0 Complex Wound Assessment / Reassessment - multiple wounds []  - 0 Dermatologic / Skin Assessment (not related to wound area) ASSESSMENTS - Focused Assessment []  - 0 Circumferential Edema Measurements - multi extremities []  - 0 Nutritional Assessment / Counseling / Intervention Jamie [name], Jamie [name] ([id number]) [phone] Page 2 of 6 []  - 0 Lower Extremity Assessment (monofilament, tuning fork, pulses) []  - 0 Peripheral Arterial Disease Assessment (using hand held doppler) ASSESSMENTS - Ostomy and/or Continence Assessment and Care []  - 0 Incontinence Assessment and Management []  - 0 Ostomy Care Assessment and Management (repouching, etc.) PROCESS - Coordination of Care X - Simple Patient / Family Education for ongoing care 1 15 []  - 0 Complex (extensive) Patient / Family Education for ongoing care []  - 0 Staff obtains Chiropractor, Records, T Results / Process Orders est []  - 0 Staff telephones HHA, Nursing Homes / Clarify orders / etc []  - 0 Routine Transfer to another Facility (non-emergent condition) []  - 0 Routine Hospital Admission (non-emergent condition) []  - 0 New Admissions / Manufacturing engineer / Ordering NPWT Apligraf, etc. , []  - 0 Emergency Hospital Admission (emergent condition) X- 1 10 Simple Discharge Coordination []  - 0 Complex (extensive) Discharge Coordination PROCESS - Special Needs []  - 0 Pediatric / Minor Patient Management []  - 0 Isolation Patient  Management []  - 0 Hearing / Language / Visual special needs []  - 0 Assessment of Community assistance (transportation, D/C planning, etc.) []  - 0 Additional assistance / Altered mentation []  - 0 Support Surface(s) Assessment (bed, cushion, seat, etc.)  INTERVENTIONS - Wound Cleansing / Measurement []  - 0 Simple Wound Cleansing - one wound X- 2 5 Complex Wound Cleansing - multiple wounds []  - 0 Wound Imaging (photographs - any number of wounds) []  - 0 Wound Tracing (instead of photographs) []  - 0 Simple Wound Measurement - one wound []  - 0 Complex Wound Measurement - multiple wounds INTERVENTIONS - Wound Dressings []  - 0 Small Wound Dressing one or multiple wounds X- 2 15 Medium Wound Dressing one or multiple wounds []  - 0 Large Wound Dressing one or multiple wounds []  - 0 Application of Medications - topical []  - 0 Application of Medications - injection INTERVENTIONS - Miscellaneous []  - 0 External ear exam []  - 0 Specimen Collection (cultures, biopsies, blood, body fluids, etc.) []  - 0 Specimen(s) / Culture(s) sent or taken to Lab for analysis []  - 0 Patient Transfer (multiple staff / Nurse, adult / Similar devices) []  - 0 Simple Staple / Suture removal (25 or less) []  - 0 Complex Staple / Suture removal (26 or more) []  - 0 Hypo / Hyperglycemic Management (close monitor of Blood Glucose) Jamie [name], Jamie [name] ([id number]) [phone].pdf Page 3 of 6 []  - 0 Ankle / Brachial Index (ABI) - do not check if billed separately []  - 0 Vital Signs Has the patient been seen at the hospital within the last three years: Yes Total Score: 80 Level Of Care: New/Established - Level 3 Electronic Signature(s) Signed: 03/05/2023 3:03:06 PM By: Jamie [name] Entered By: Jamie [name] on 03/05/2023 07:59:05 -------------------------------------------------------------------------------- Encounter Discharge Information Details Patient Name: Date of  Service: Jamie [name], [name] 03/05/2023 10:30 A M Medical Record Number: [id number] Patient Account Number: [id number] Date of Birth/Sex: Treating RN: [name] (82 y.o. F) Primary Care Benjamyn Hestand: Jamie [name] Other Clinician: Thayer [name] Referring Nyomi Howser: Treating Mlissa Tamayo/Extender: Jamie [name], Jamie [name] in Treatment: 92 Encounter Discharge Information Items Discharge Condition: Stable Ambulatory Status: Walker Discharge Destination: Home Transportation: Private Auto Accompanied By: niece Schedule Follow-up Appointment: Yes Clinical Summary of Care: Electronic Signature(s) Signed: 03/05/2023 3:03:06 PM By: Jamie [name] Entered By: Jamie [name] on 03/05/2023 07:59:51 -------------------------------------------------------------------------------- Patient/Caregiver Education Details Patient Name: Date of Service: [name] 11/27/2024andnbsp10:30 A M Medical Record Number: [id number] Patient Account Number: [id number] Date of Birth/Gender: Treating RN: [name] (82 y.o. F) Primary Care Physician: Jamie [name] Other Clinician: Thayer [name] Referring Physician: Treating Physician/Extender: [name] in Treatment: 66 Education Assessment Education Provided To: Patient Education Topics Provided Electronic Signature(s) Signed: 03/05/2023 3:03:06 PM By: Jamie [name] Entered By: Jamie [name] on 03/05/2023 07:59:29 [name] ([id number]) [id number] Page 4 of 6 -------------------------------------------------------------------------------- Wound Assessment Details Patient Name: Date of Service: Jamie [name], [name]. 03/05/2023 10:30 A M Medical Record Number: [id number] Patient Account Number: [id number] Date of Birth/Sex: Treating RN: [birth year] (82 y.o. F) Primary Care Preston Weill: Jamie [name] Other Clinician: Referring Jovian Lembcke: Treating  Inna Tisdell/Extender: Jamie [name], Jamie [name] in Treatment: 41 Wound Status Wound Number: 5 Primary Etiology: Venous Leg Ulcer Wound Location: Right, Lateral Lower Leg Wound Status: Open Wounding Event: Trauma Date Acquired: 04/17/2022 [name] Of Treatment: 41 Clustered Wound: Yes Wound Measurements Length: (cm) 2.3 Width: (cm) 0.8 Depth: (cm) 0.1 Area: (cm) 1.445 Volume: (cm) 0.145 % Reduction in Area: 94.5% % Reduction in Volume: 98.2% Wound Description Classification: Full Thickness Without Exposed Support Exudate Amount: Medium Exudate Type: Serosanguineous Exudate Color: red, brown Structures Periwound Skin Texture Texture Color No Abnormalities Noted: No No Abnormalities Noted: No Moisture  No Abnormalities Noted: No Treatment Notes Wound #5 (Lower Leg) Wound Laterality: Right, Lateral Cleanser Soap and Water Discharge Instruction: May shower and wash wound with dial antibacterial soap and water prior to dressing change. Peri-Wound Care Sween Lotion (Moisturizing lotion) Discharge Instruction: Apply moisturizing lotion as directed Topical Skintegrity Hydrogel 4 (oz) Discharge Instruction: Apply hydrogel with sorbact compounding topical antibiotics Discharge Instruction: apply Keystone directly to wound bed under the hydrofera blue. Home Health start. MIX ACCORDING TO THE PHARMACY INSTRUCTIONS. Primary Dressing Cutimed Sorbact Swab Discharge Instruction: Apply to wound bed with Hydrogel Secondary Dressing ABD Pad, 8x10 Discharge Instruction: Apply over primary dressing as directed. Woven Gauze Sponge, Non-Sterile 4x4 in Discharge Instruction: Apply over primary dressing as directed. Zetuvit Plus 4x8 in Discharge Instruction: Or double ABD pad Apply over primary dressing as directed. Jamie [name], Jamie [name] ([id number]) [id number] Page 5 of 6 Secured With Compression Wrap Kerlix Roll 4.5x3.1 (in/yd) Discharge  Instruction: Apply Kerlix and Coban compression as directed. Coban Self-Adherent Wrap 4x5 (in/yd) Discharge Instruction: Apply over Kerlix as directed. unna boot FIRST LAYER **** SEE INSTRUCTIONS Discharge Instruction: APPLY FIRST LAYER UNNA BOOT AT BASES OF TOES AND JUST BELOW THE KNEE TO HOLD COMPRESSION WRAP IN PLACE. Compression Stockings Add-Ons Electronic Signature(s) Signed: 03/05/2023 3:03:06 PM By: Jamie [name] Entered By: Jamie [name] on 03/05/2023 06:56:52 -------------------------------------------------------------------------------- Wound Assessment Details Patient Name: Date of Service: Jamie [name], [name] 03/05/2023 10:30 A M Medical Record Number: [id number] Patient Account Number: [id number] Date of Birth/Sex: Treating RN: [name] (82 y.o. F) Primary Care Makar Slatter: Jamie [name] Other Clinician: Referring Linas Stepter: Treating Zamani Crocker/Extender: Jamie [name], Jamie [name] in Treatment: 41 Wound Status Wound Number: 7 Primary Etiology: Venous Leg Ulcer Wound Location: Right, Posterior Lower Leg Wound Status: Open Wounding Event: Gradually Appeared Date Acquired: 01/01/2023 [name] Of Treatment: 9 Clustered Wound: No Wound Measurements Length: (cm) 4 Width: (cm) 4 Depth: (cm) 0.1 Area: (cm) 12.566 Volume: (cm) 1.257 % Reduction in Area: -6.9% % Reduction in Volume: 46.5% Wound Description Classification: Full Thickness Without Exposed Suppor Exudate Amount: Medium Exudate Type: Serosanguineous Exudate Color: red, brown t Structures Periwound Skin Texture Texture Color No Abnormalities Noted: No No Abnormalities Noted: No Moisture No Abnormalities Noted: No Treatment Notes Wound #7 (Lower Leg) Wound Laterality: Right, Posterior Cleanser Soap and Water Discharge Instruction: May shower and wash wound with dial antibacterial soap and water prior to dressing change. Peri-Wound Care Jamie [name], Jamie [name] ([id number])  [id number] Page 6 of 6 Sween Lotion (Moisturizing lotion) Discharge Instruction: Apply moisturizing lotion as directed Topical Skintegrity Hydrogel 4 (oz) Discharge Instruction: Apply hydrogel with sorbact compounding topical antibiotics Discharge Instruction: apply Keystone directly to wound bed under the hydrofera blue. Home Health start. MIX ACCORDING TO THE PHARMACY INSTRUCTIONS. Primary Dressing Cutimed Sorbact Swab Discharge Instruction: Apply to wound bed with Hydrogel Secondary Dressing ABD Pad, 8x10 Discharge Instruction: Apply over primary dressing as directed. Woven Gauze Sponge, Non-Sterile 4x4 in Discharge Instruction: Apply over primary dressing as directed. Zetuvit Plus 4x8 in Discharge Instruction: Or double ABD pad Apply over primary dressing as directed. Secured With Compression Wrap Kerlix Roll 4.5x3.1 (in/yd) Discharge Instruction: Apply Kerlix and Coban compression as directed. Coban Self-Adherent Wrap 4x5 (in/yd) Discharge Instruction: Apply over Kerlix as directed. unna boot FIRST LAYER **** SEE INSTRUCTIONS Discharge Instruction: APPLY FIRST LAYER UNNA BOOT AT BASES OF TOES AND JUST BELOW THE KNEE TO HOLD COMPRESSION WRAP IN PLACE. Compression Stockings Add-Ons Electronic Signature(s) Signed: 03/05/2023 3:03:06 PM By: Jamie [name] Entered By: Jamie [name] on 03/05/2023  06:56:52 -------------------------------------------------------------------------------- Vitals Details Patient Name: Date of Service: Jamie [name], [name]. 03/05/2023 10:30 A M Medical Record Number: [id number] Patient Account Number: [id number] Date of Birth/Sex: Treating RN: [name] (82 y.o. F) Primary Care Hania Cerone: Jamie [name] Other Clinician: Referring Sargon Scouten: Treating Dyllen Menning/Extender: Jamie [name], Jamie [name] in Treatment: 20 Vital Signs Time Taken: 09:56 Reference Range: 80 - 120 mg / dl Height (in): 62 Weight  (lbs): 171 Body Mass Index (BMI): 31.3 Electronic Signature(s) Signed: 03/05/2023 3:03:06 PM By: Jamie [name] Entered By: Jamie [name] on 03/05/2023 06:56:39

## 2023-03-12 ENCOUNTER — Encounter (HOSPITAL_BASED_OUTPATIENT_CLINIC_OR_DEPARTMENT_OTHER): Payer: Medicare HMO | Attending: Internal Medicine | Admitting: Internal Medicine

## 2023-03-12 DIAGNOSIS — I87331 Chronic venous hypertension (idiopathic) with ulcer and inflammation of right lower extremity: Secondary | ICD-10-CM | POA: Diagnosis present

## 2023-03-12 DIAGNOSIS — I2510 Atherosclerotic heart disease of native coronary artery without angina pectoris: Secondary | ICD-10-CM | POA: Diagnosis not present

## 2023-03-12 DIAGNOSIS — Z8614 Personal history of Methicillin resistant Staphylococcus aureus infection: Secondary | ICD-10-CM | POA: Insufficient documentation

## 2023-03-12 DIAGNOSIS — L97812 Non-pressure chronic ulcer of other part of right lower leg with fat layer exposed: Secondary | ICD-10-CM | POA: Diagnosis not present

## 2023-03-12 NOTE — Progress Notes (Signed)
HARLIV, HOMMEL ([id number]) [id number] Page 1 of 12 Visit Report for 03/12/2023 Arrival Information Details Patient Name: Date of Service: Jamie [name], Jamie [name]. 03/12/2023 10:30 A M Medical Record Number: [id number] Patient Account Number: [id number] Date of Birth/Sex: Treating RN: 1940-04-24 (82 y.o. Jamie [name], Jamie [name] Primary Care Zada Haser: Belva Agee Other Clinician: Referring Kamarius Buckbee: Treating Dany Walther/Extender: Elby Beck in Treatment: 42 Visit Information History Since Last Visit Added or deleted any medications: No Patient Arrived: Ambulatory Any new allergies or adverse reactions: No Arrival Time: 10:48 Had a fall or experienced change in No Accompanied By: self activities of daily living that may affect Transfer Assistance: Manual risk of falls: Patient Identification Verified: Yes Signs or symptoms of abuse/neglect since last visito No Secondary Verification Process Completed: Yes Hospitalized since last visit: No Patient Requires Transmission-Based Precautions: No Implantable device outside of the clinic excluding No Patient Has Alerts: No cellular tissue based products placed in the center since last visit: Has Dressing in Place as Prescribed: Yes Has Compression in Place as Prescribed: Yes Pain Present Now: No Electronic Signature(s) Unsigned Entered By: Fonnie Mu on 03/12/2023 10:49:02 -------------------------------------------------------------------------------- Clinic Level of Care Assessment Details Patient Name: Date of Service: Jamie [name], Jamie [name]. 03/12/2023 10:30 A M Medical Record Number: [id number] Patient Account Number: [id number] Date of Birth/Sex: Treating RN: [birth year] (82 y.o. Jamie [name], Jamie [name] Primary Care Clifford Coudriet: Belva Agee Other Clinician: Referring Lu Paradise: Treating Sharlotte Baka/Extender: Elby Beck in Treatment: 42 Clinic Level of Care  Assessment Items TOOL 4 Quantity Score X- 1 0 Use when only an EandM is performed on FOLLOW-UP visit ASSESSMENTS - Nursing Assessment / Reassessment X- 1 10 Reassessment of Co-morbidities (includes updates in patient status) X- 1 5 Reassessment of Adherence to Treatment Plan ASSESSMENTS - Wound and Skin A ssessment / Reassessment []  - 0 Simple Wound Assessment / Reassessment - one wound X- 2 5 Complex Wound Assessment / Reassessment - multiple wounds []  - 0 Dermatologic / Skin Assessment (not related to wound area) ASSESSMENTS - Focused Assessment X- 1 5 Circumferential Edema Measurements - multi extremities Jamie [name], Jamie [name] ([id number]) [id number] Page 2 of 12 []  - 0 Nutritional Assessment / Counseling / Intervention []  - 0 Lower Extremity Assessment (monofilament, tuning fork, pulses) []  - 0 Peripheral Arterial Disease Assessment (using hand held doppler) ASSESSMENTS - Ostomy and/or Continence Assessment and Care []  - 0 Incontinence Assessment and Management []  - 0 Ostomy Care Assessment and Management (repouching, etc.) PROCESS - Coordination of Care []  - 0 Simple Patient / Family Education for ongoing care X- 1 20 Complex (extensive) Patient / Family Education for ongoing care X- 1 10 Staff obtains Chiropractor, Records, T Results / Process Orders est []  - 0 Staff telephones HHA, Nursing Homes / Clarify orders / etc []  - 0 Routine Transfer to another Facility (non-emergent condition) []  - 0 Routine Hospital Admission (non-emergent condition) []  - 0 New Admissions / Manufacturing engineer / Ordering NPWT Apligraf, etc. , []  - 0 Emergency Hospital Admission (emergent condition) []  - 0 Simple Discharge Coordination X- 1 15 Complex (extensive) Discharge Coordination PROCESS - Special Needs []  - 0 Pediatric / Minor Patient Management []  - 0 Isolation Patient Management []  - 0 Hearing / Language / Visual special needs []  -  0 Assessment of Community assistance (transportation, D/C planning, etc.) []  - 0 Additional assistance / Altered mentation []  - 0 Support Surface(s) Assessment (bed, cushion, seat, etc.) INTERVENTIONS - Wound Cleansing / Measurement []  - 0 Simple  Wound Cleansing - one wound X- 2 5 Complex Wound Cleansing - multiple wounds X- 1 5 Wound Imaging (photographs - any number of wounds) []  - 0 Wound Tracing (instead of photographs) []  - 0 Simple Wound Measurement - one wound X- 2 5 Complex Wound Measurement - multiple wounds INTERVENTIONS - Wound Dressings []  - 0 Small Wound Dressing one or multiple wounds X- 2 15 Medium Wound Dressing one or multiple wounds []  - 0 Large Wound Dressing one or multiple wounds X- 1 5 Application of Medications - topical []  - 0 Application of Medications - injection INTERVENTIONS - Miscellaneous []  - 0 External ear exam []  - 0 Specimen Collection (cultures, biopsies, blood, body fluids, etc.) []  - 0 Specimen(s) / Culture(s) sent or taken to Lab for analysis []  - 0 Patient Transfer (multiple staff / Michiel Sites Lift / Similar devices) []  - 0 Simple Staple / Suture removal (25 or less) []  - 0 Complex Staple / Suture removal (26 or more) Jamie [name], Jamie [name] ([id number]) [id number] Page 3 of 12 []  - 0 Hypo / Hyperglycemic Management (close monitor of Blood Glucose) []  - 0 Ankle / Brachial Index (ABI) - do not check if billed separately X- 1 5 Vital Signs Has the patient been seen at the hospital within the last three years: Yes Total Score: 140 Level Of Care: New/Established - Level 4 Electronic Signature(s) Unsigned Entered By: Fonnie Mu on 03/12/2023 11:38:08 -------------------------------------------------------------------------------- Encounter Discharge Information Details Patient Name: Date of Service: Jamie [name], Jamie [name]. 03/12/2023 10:30 A M Medical Record Number: [id number] Patient Account Number:  [id number] Date of Birth/Sex: Treating RN: 04-Jan-1941 (82 y.o. Jamie [name], Jamie [name] Primary Care Ustin Cruickshank: Belva Agee Other Clinician: Referring Niketa Turner: Treating Mahonri Seiden/Extender: Elby Beck in Treatment: 42 Encounter Discharge Information Items Discharge Condition: Stable Ambulatory Status: Ambulatory Discharge Destination: Home Transportation: Private Auto Accompanied By: self Schedule Follow-up Appointment: Yes Clinical Summary of Care: Patient Declined Electronic Signature(s) Signed: 03/12/2023 11:40:04 AM By: Fonnie Mu RN Entered By: Fonnie Mu on 03/12/2023 11:40:04 -------------------------------------------------------------------------------- Lower Extremity Assessment Details Patient Name: Date of Service: Gevena Mart W. 03/12/2023 10:30 A M Medical Record Number: [id number] Patient Account Number: [id number] Date of Birth/Sex: Treating RN: 06/24/1940 (82 y.o. Jamie [name], Jamie [name] Primary Care Cordelle Dahmen: Belva Agee Other Clinician: Referring Dammon Makarewicz: Treating Roben Tatsch/Extender: Elby Beck in Treatment: 42 Edema Assessment Assessed: [name]: No] Franne Forts: Yes] Edema: [Left: Ye] [Right: s] Calf Left: Right: Point of Measurement: From Medial Instep 35.8 cm Ankle Left: Right: JORGA, PERRICONE ([id number]) [id number] Page 4 of 12 Point of Measurement: From Medial Instep 24 cm Vascular Assessment Pulses: Dorsalis Pedis Palpable: [Right:Yes] Posterior Tibial Palpable: [Right:Yes] Extremity colors, hair growth, and conditions: Extremity Color: [Right:Normal] Hair Growth on Extremity: [Right:No] Temperature of Extremity: [Right:Warm] Capillary Refill: [Right:< 3 seconds] Dependent Rubor: [Right:No No] Electronic Signature(s) Signed: 03/12/2023 10:42:07 AM By: Fonnie Mu RN Entered By: Fonnie Mu on 03/12/2023  10:42:07 -------------------------------------------------------------------------------- Multi Wound Chart Details Patient Name: Date of Service: Jamie [name], [name] 03/12/2023 10:30 A M Medical Record Number: [id number] Patient Account Number: [id number] Date of Birth/Sex: Treating RN: [birth year] (82 y.o. F) Primary Care Edyn Qazi: Belva Agee Other Clinician: Referring Eurydice Calixto: Treating Zedekiah Hinderman/Extender: Elby Beck in Treatment: 42 Vital Signs Height(in): 62 Pulse(bpm): 50 Weight(lbs): 171 Blood Pressure(mmHg): 143/78 Body Mass Index(BMI): 31.3 Temperature(F): 98 Respiratory Rate(breaths/min): 17 [5:Photos:] [N/A:N/A] Right, Lateral Lower Leg Right, Posterior Lower Leg N/A Wound Location: Trauma Gradually Appeared N/A Wounding Event: Venous Leg  Ulcer Venous Leg Ulcer N/A Primary Etiology: Sleep Apnea, Hypertension, Peripheral Sleep Apnea, Hypertension, Peripheral N/A Comorbid History: Venous Disease, Osteoarthritis, Venous Disease, Osteoarthritis, Neuropathy Neuropathy 04/17/2022 01/01/2023 N/A Date Acquired: 72 10 N/A Weeks of Treatment: Open Open N/A Wound Status: No No N/A Wound Recurrence: Yes No N/A Clustered Wound: 1.5x1.2x0.1 1.8x3.5x0.1 N/A Measurements L x W x D (cm) 1.414 4.948 N/A A (cm) : rea 0.141 0.495 N/A Volume (cm) : 94.60% 57.90% N/A % Reduction in Area: 98.20% 78.90% N/A % Reduction in Volume: Full Thickness Without Exposed Full Thickness Without Exposed N/A Classification: Support Structures Support Structures Medium Medium N/A Exudate Amount: Serosanguineous Serosanguineous N/A Exudate TypeHEYLI, Jamie [name] ([id number]) [id number] Page 5 of 12 red, brown red, brown N/A Exudate Color: Large (67-100%) Large (67-100%) N/A Granulation Amount: Red, Pink Red, Pink N/A Granulation Quality: Small (1-33%) Small (1-33%) N/A Necrotic Amount: Fascia: No Fascia: No N/A Exposed  Structures: Fat Layer (Subcutaneous Tissue): No Fat Layer (Subcutaneous Tissue): No Tendon: No Tendon: No Muscle: No Muscle: No Joint: No Joint: No Bone: No Bone: No Medium (34-66%) Large (67-100%) N/A Epithelialization: Excoriation: No Excoriation: No N/A Periwound Skin Texture: Induration: No Induration: No Callus: No Callus: No Crepitus: No Crepitus: No Rash: No Rash: No Scarring: No Scarring: No Maceration: No Maceration: No N/A Periwound Skin Moisture: Dry/Scaly: No Dry/Scaly: No Atrophie Blanche: No Atrophie Blanche: No N/A Periwound Skin Color: Cyanosis: No Cyanosis: No Ecchymosis: No Ecchymosis: No Erythema: No Erythema: No Hemosiderin Staining: No Hemosiderin Staining: No Mottled: No Mottled: No Pallor: No Pallor: No Rubor: No Rubor: No No Abnormality No Abnormality N/A Temperature: Yes Yes N/A Tenderness on Palpation: Treatment Notes Wound #5 (Lower Leg) Wound Laterality: Right, Lateral Cleanser Soap and Water Discharge Instruction: May shower and wash wound with dial antibacterial soap and water prior to dressing change. Peri-Wound Care Sween Lotion (Moisturizing lotion) Discharge Instruction: Apply moisturizing lotion as directed Topical Skintegrity Hydrogel 4 (oz) Discharge Instruction: Apply hydrogel with sorbact compounding topical antibiotics Discharge Instruction: apply Keystone directly to wound bed under the hydrofera blue. Home Health start. MIX ACCORDING TO THE PHARMACY INSTRUCTIONS. Primary Dressing Cutimed Sorbact Swab Discharge Instruction: Apply to wound bed with Hydrogel Secondary Dressing ABD Pad, 8x10 Discharge Instruction: Apply over primary dressing as directed. Woven Gauze Sponge, Non-Sterile 4x4 in Discharge Instruction: Apply over primary dressing as directed. Zetuvit Plus 4x8 in Discharge Instruction: Or double ABD pad Apply over primary dressing as directed. Secured With Compression Wrap Kerlix Roll  4.5x3.1 (in/yd) Discharge Instruction: Apply Kerlix and Coban compression as directed. Coban Self-Adherent Wrap 4x5 (in/yd) Discharge Instruction: Apply over Kerlix as directed. unna boot FIRST LAYER **** SEE INSTRUCTIONS Discharge Instruction: APPLY FIRST LAYER UNNA BOOT AT BASES OF TOES AND JUST BELOW THE KNEE TO HOLD COMPRESSION WRAP IN PLACE. Compression Stockings Add-Ons Wound #7 (Lower Leg) Wound Laterality: Right, Posterior Jamie [name], Jamie [name] ([id number]) [id number] Page 6 of 12 Cleanser Soap and Water Discharge Instruction: May shower and wash wound with dial antibacterial soap and water prior to dressing change. Peri-Wound Care Sween Lotion (Moisturizing lotion) Discharge Instruction: Apply moisturizing lotion as directed Topical Skintegrity Hydrogel 4 (oz) Discharge Instruction: Apply hydrogel with sorbact compounding topical antibiotics Discharge Instruction: apply Keystone directly to wound bed under the hydrofera blue. Home Health start. MIX ACCORDING TO THE PHARMACY INSTRUCTIONS. Primary Dressing Cutimed Sorbact Swab Discharge Instruction: Apply to wound bed with Hydrogel Secondary Dressing ABD Pad, 8x10 Discharge Instruction: Apply over primary dressing as directed. Woven Gauze Sponge, Non-Sterile 4x4 in Discharge Instruction: Apply  over primary dressing as directed. Zetuvit Plus 4x8 in Discharge Instruction: Or double ABD pad Apply over primary dressing as directed. Secured With Compression Wrap Kerlix Roll 4.5x3.1 (in/yd) Discharge Instruction: Apply Kerlix and Coban compression as directed. Coban Self-Adherent Wrap 4x5 (in/yd) Discharge Instruction: Apply over Kerlix as directed. unna boot FIRST LAYER **** SEE INSTRUCTIONS Discharge Instruction: APPLY FIRST LAYER UNNA BOOT AT BASES OF TOES AND JUST BELOW THE KNEE TO HOLD COMPRESSION WRAP IN PLACE. Compression Stockings Add-Ons Electronic Signature(s) Unsigned Entered By:  [name] on 03/12/2023 11:40:26 -------------------------------------------------------------------------------- Multi-Disciplinary Care Plan Details Patient Name: Date of Service: Jamie [name], Jamie [name]. 03/12/2023 10:30 A M Medical Record Number: [id number] Patient Account Number: [id number] Date of Birth/Sex: Treating RN: 07/24/1940 (82 y.o. Jamie [name], Jamie [name] Primary Care Lyrika Souders: Belva Agee Other Clinician: Referring Haaris Metallo: Treating Maikol Grassia/Extender: Elby Beck in Treatment: 42 Multidisciplinary Care Plan reviewed with physician Active Inactive Pain, Acute or Chronic DEAIRA, CONKEY ([id number]) [id number] Page 7 of 12 Nursing Diagnoses: Pain Management - Cyclic Acute (Dressing Change Related) Pain Management - Non-cyclic Acute (Procedural) Pain, acute or chronic: actual or potential Goals: Patient will verbalize adequate pain control and receive pain control interventions during procedures as needed Date Initiated: 09/11/2022 Target Resolution Date: 05/09/2023 Goal Status: Active Patient/caregiver will verbalize adequate pain control between visits Date Initiated: 09/11/2022 Target Resolution Date: 05/09/2023 Goal Status: Active Patient/caregiver will verbalize comfort level met Date Initiated: 09/11/2022 Target Resolution Date: 05/09/2023 Goal Status: Active Interventions: Complete pain assessment as per visit requirements Encourage patient to take pain medications as prescribed Provide education on pain management Provision of support: recognize patient pain, provide comfort and support as needed Reposition patient for comfort Treatment Activities: Administer pain control measures as ordered : 09/11/2022 Notes: Electronic Signature(s) Signed: 03/12/2023 10:42:29 AM By: Fonnie Mu RN Entered By: Fonnie Mu on 03/12/2023  10:42:28 -------------------------------------------------------------------------------- Pain Assessment Details Patient Name: Date of Service: Jamie [name], [name] 03/12/2023 10:30 A M Medical Record Number: [id number] Patient Account Number: [id number] Date of Birth/Sex: Treating RN: Oct 19, 1940 (82 y.o. Jamie [name], Jamie [name] Primary Care Adriann Thau: Belva Agee Other Clinician: Referring Salvadore Valvano: Treating Manolito Jurewicz/Extender: Elby Beck in Treatment: 42 Active Problems Location of Pain Severity and Description of Pain Patient Has Paino No Site Locations Pain Management and Medication Current Pain Management: LADISLAVA, PREGLER ([id number]) [id number] Page 8 of 12 Electronic Signature(s) Unsigned Entered By: Fonnie Mu on 03/12/2023 10:55:13 -------------------------------------------------------------------------------- Patient/Caregiver Education Details Patient Name: Date of Service: Jamie [name], [place] 12/4/2024andnbsp10:30 A M Medical Record Number: [id number] Patient Account Number: [id number] Date of Birth/Gender: Treating RN: 06/12/1940 (82 y.o. Jamie [name], Jamie [name] Primary Care Physician: Belva Agee Other Clinician: Referring Physician: Treating Physician/Extender: Elby Beck in Treatment: 44 Education Assessment Education Provided To: Patient Education Topics Provided Wound/Skin Impairment: Methods: Explain/Verbal Responses: Reinforcements needed, State content correctly Electronic Signature(s) Unsigned Entered By: Fonnie Mu on 03/12/2023 10:42:43 -------------------------------------------------------------------------------- Wound Assessment Details Patient Name: Date of Service: Jamie [name]. 03/12/2023 10:30 A M Medical Record Number: [id number] Patient Account Number: [id number] Date of Birth/Sex: Treating RN: 1940/08/10 (82 y.o. Jamie [name],  Jamie [name] Primary Care Curtisha Bendix: Belva Agee Other Clinician: Referring Bernestine Holsapple: Treating Sopheap Basic/Extender: Elby Beck in Treatment: 42 Wound Status Wound Number: 5 Primary Venous Leg Ulcer Etiology: Wound Location: Right, Lateral Lower Leg Wound Open Wounding Event: Trauma Status: Date Acquired: 04/17/2022 Comorbid Sleep Apnea, Hypertension, Peripheral Venous Disease, Weeks Of Treatment: 42 History: Osteoarthritis, Neuropathy Clustered Wound:  [address] VETRA, VANLEEUWEN ([id number]) [id number] Page 9 of 12 Wound Measurements Length: (cm) 1.5 Width: (cm) 1.2 Depth: (cm) 0.1 Area: (cm) 1.414 Volume: (cm) 0.141 % Reduction in Area: 94.6% % Reduction in Volume: 98.2% Epithelialization: Medium (34-66%) Tunneling: No Undermining: No Wound Description Classification: Full Thickness Without Exposed Sup Exudate Amount: Medium Exudate Type: Serosanguineous Exudate Color: red, brown port Structures Wound Bed Granulation Amount: Large (67-100%) Exposed Structure Granulation Quality: Red, Pink Fascia Exposed: No Necrotic Amount: Small (1-33%) Fat Layer (Subcutaneous Tissue) Exposed: No Necrotic Quality: Adherent Slough Tendon Exposed: No Muscle Exposed: No Joint Exposed: No Bone Exposed: No Periwound Skin Texture Texture Color No Abnormalities Noted: No No Abnormalities Noted: No Callus: No Atrophie Blanche: No Crepitus: No Cyanosis: No Excoriation: No Ecchymosis: No Induration: No Erythema: No Rash: No Hemosiderin Staining: No Scarring: No Mottled: No Pallor: No Moisture Rubor: No No Abnormalities Noted: No Dry / Scaly: No Temperature / Pain Maceration: No Temperature: No Abnormality Tenderness on Palpation: Yes Treatment Notes Wound #5 (Lower Leg) Wound Laterality: Right, Lateral Cleanser Soap and Water Discharge Instruction: May shower and wash wound with dial antibacterial soap and water prior to  dressing change. Peri-Wound Care Sween Lotion (Moisturizing lotion) Discharge Instruction: Apply moisturizing lotion as directed Topical Skintegrity Hydrogel 4 (oz) Discharge Instruction: Apply hydrogel with sorbact compounding topical antibiotics Discharge Instruction: apply Keystone directly to wound bed under the hydrofera blue. Home Health start. MIX ACCORDING TO THE PHARMACY INSTRUCTIONS. Primary Dressing Cutimed Sorbact Swab Discharge Instruction: Apply to wound bed with Hydrogel Jamie [name], Jamie [name] ([id number]) [id number] Page 10 of 12 Secondary Dressing ABD Pad, 8x10 Discharge Instruction: Apply over primary dressing as directed. Woven Gauze Sponge, Non-Sterile 4x4 in Discharge Instruction: Apply over primary dressing as directed. Zetuvit Plus 4x8 in Discharge Instruction: Or double ABD pad Apply over primary dressing as directed. Secured With Compression Wrap Kerlix Roll 4.5x3.1 (in/yd) Discharge Instruction: Apply Kerlix and Coban compression as directed. Coban Self-Adherent Wrap 4x5 (in/yd) Discharge Instruction: Apply over Kerlix as directed. unna boot FIRST LAYER **** SEE INSTRUCTIONS Discharge Instruction: APPLY FIRST LAYER UNNA BOOT AT BASES OF TOES AND JUST BELOW THE KNEE TO HOLD COMPRESSION WRAP IN PLACE. Compression Stockings Add-Ons Electronic Signature(s) Unsigned Entered By: Fonnie Mu on 03/12/2023 11:00:02 -------------------------------------------------------------------------------- Wound Assessment Details Patient Name: Date of Service: Jamie [name], Jamie [name]. 03/12/2023 10:30 A M Medical Record Number: [id number] Patient Account Number: [id number] Date of Birth/Sex: Treating RN: 12-Apr-1940 (82 y.o. Jamie [name], Jamie [name] Primary Care Jalesia Loudenslager: Belva Agee Other Clinician: Referring Trea Carnegie: Treating Daijanae Rafalski/Extender: Elby Beck in Treatment: 42 Wound Status Wound Number: 7 Primary Venous  Leg Ulcer Etiology: Wound Location: Right, Posterior Lower Leg Wound Open Wounding Event: Gradually Appeared Status: Date Acquired: 01/01/2023 Comorbid Sleep Apnea, Hypertension, Peripheral Venous Disease, Weeks Of Treatment: 10 History: Osteoarthritis, Neuropathy Clustered Wound: No Photos Wound Measurements Length: (cm) 1.8 Width: (cm) 3.5 Depth: (cm) 0.1 Area: (cm) 4.948 Volume: (cm) 0.495 Jamie [name], Jamie [name] ([id number]) % Reduction in Area: 57.9% % Reduction in Volume: 78.9% Epithelialization: Large (67-100%) Tunneling: No Undermining: No [id number] Page 11 of 12 Wound Description Classification: Full Thickness Without Exposed Support Structures Exudate Amount: Medium Exudate Type: Serosanguineous Exudate Color: red, brown Foul Odor After Cleansing: No Slough/Fibrino Yes Wound Bed Granulation Amount: Large (67-100%) Exposed Structure Granulation Quality: Red, Pink Fascia Exposed: No Necrotic Amount: Small (1-33%) Fat Layer (Subcutaneous Tissue) Exposed: No Necrotic Quality: Adherent Slough Tendon Exposed: No Muscle Exposed: No Joint Exposed: No Bone Exposed: No Periwound Skin Texture  Texture Color No Abnormalities Noted: No No Abnormalities Noted: No Callus: No Atrophie Blanche: No Crepitus: No Cyanosis: No Excoriation: No Ecchymosis: No Induration: No Erythema: No Rash: No Hemosiderin Staining: No Scarring: No Mottled: No Pallor: No Moisture Rubor: No No Abnormalities Noted: No Dry / Scaly: No Temperature / Pain Maceration: No Temperature: No Abnormality Tenderness on Palpation: Yes Treatment Notes Wound #7 (Lower Leg) Wound Laterality: Right, Posterior Cleanser Soap and Water Discharge Instruction: May shower and wash wound with dial antibacterial soap and water prior to dressing change. Peri-Wound Care Sween Lotion (Moisturizing lotion) Discharge Instruction: Apply moisturizing lotion as  directed Topical Skintegrity Hydrogel 4 (oz) Discharge Instruction: Apply hydrogel with sorbact compounding topical antibiotics Discharge Instruction: apply Keystone directly to wound bed under the hydrofera blue. Home Health start. MIX ACCORDING TO THE PHARMACY INSTRUCTIONS. Primary Dressing Cutimed Sorbact Swab Discharge Instruction: Apply to wound bed with Hydrogel Secondary Dressing ABD Pad, 8x10 Discharge Instruction: Apply over primary dressing as directed. Woven Gauze Sponge, Non-Sterile 4x4 in Discharge Instruction: Apply over primary dressing as directed. Zetuvit Plus 4x8 in Discharge Instruction: Or double ABD pad Apply over primary dressing as directed. Secured With Compression Wrap Kerlix Roll 4.5x3.1 (in/yd) Discharge Instruction: Apply Kerlix and Coban compression as directed. Coban Self-Adherent Wrap 4x5 (in/yd) Discharge Instruction: Apply over Kerlix as directed. unna boot FIRST LAYER **** SEE INSTRUCTIONS Discharge Instruction: APPLY FIRST LAYER UNNA BOOT AT BASES OF TOES AND JUST BELOW THE KNEE TO HOLD COMPRESSION WRAP IN PLACE. Jamie [name], Jamie [name] ([id number]) [id number] Page 12 of 12 Compression Stockings Add-Ons Electronic Signature(s) Unsigned Entered By: Fonnie Mu on 03/12/2023 11:00:30 -------------------------------------------------------------------------------- Vitals Details Patient Name: Date of Service: Jamie [name], Jamie [name]. 03/12/2023 10:30 A M Medical Record Number: [id number] Patient Account Number: [id number] Date of Birth/Sex: Treating RN: Jan 06, 1941 (82 y.o. Jamie [name], Jamie [name] [facility] Gazella Anglin: Belva Agee Other Clinician: Referring Mella Inclan: Treating Laiyah Exline/Extender: Elby Beck in Treatment: 42 Vital Signs Time Taken: 10:49 Temperature (F): 98 Height (in): 62 Pulse (bpm): 50 Weight (lbs): 171 Respiratory Rate (breaths/min): 17 Body Mass Index (BMI): 31.3 Blood  Pressure (mmHg): 143/78 Reference Range: 80 - 120 mg / dl Electronic Signature(s) Unsigned Entered By: Fonnie Mu on 03/12/2023 10:49:55 Signature(s): Date(s):

## 2023-03-13 NOTE — Progress Notes (Signed)
SHAINE, MERLE ([id number]) [id number] Page 1 of 11 Visit Report for 03/12/2023 HPI Details Patient Name: Date of Service: Jamie [name], [name]. 03/12/2023 10:30 A M Medical Record Number: [id number] Patient Account Number: [id number] Date of Birth/Sex: Treating RN: [birth year] (82 y.o. F) Primary Care Provider: Belva Agee Other Clinician: Referring Provider: Treating Provider/Extender: Elby Beck in Treatment: 42 History of Present Illness HPI Description: ADMISSION 06/30/2020 Jamie [name] is an 82 year old woman who lives in [place]. She is here with her niece for review of wounds on the left medial lower leg and ankle. These have apparently been present for over a year and she followed with Dr. Olegario Messier at the [facility] in [place] for quite a period of time although it looks as though there was a initial consult wound from Dr. Marcha Solders on February 18 presumably there was therefore hiatus. At that point the wounds were described as being there for 3 months. She also tells me she was at the wound care center in [place] for a period of time with this. 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 She had venous studies that were negative for DVT ABIs on the right were 1.01 on the left 1.06. She has . had previous applications of puraply, compression which she does not tolerate very well. She has had several rounds of oral antibiotic therapy. She complains of unrelenting pain and she is seeing Dr. Reece Agar V of pain management apparently was on oxycodone but that did not help. She also had a skin biopsy done by Dr. Olegario Messier although we do not have that result. She does not appear to have an arterial issue. I am not completely clear what she has been putting on the wounds lately. 3/31; this patient has a particularly nasty set of wounds on the left medial ankle in the middle of what looks to  be hemosiderin deposition secondary to chronic venous insufficiency. She has a lot of pain followed by pain management. Dr. Marcha Solders apparently did a biopsy of something on the left leg last fall what I would like to get this result. She has a history of MRSA treatment. I do not believe she had reflux studies but she did have DVT rule out studies. Previous ABIs have not suggested arterial insufficiency 4/7; difficult wounds on the left medial ankle probably chronic venous insufficiency. With considerable effort on behalf of our case manager we were able to finally to speak to somebody at the hospital in [place] who indicated that no biopsy of this area have been done even though the patient describes this in some detail and is even able to point out where she thinks the biopsy was done. We have been using Sorbact. The PCR culture I did showed polymicrobial identification with Pseudomonas, staph aureus, Peptostreptococcus. All of this and low titers. Resistance genes identified were MRSA, staph virulence gene and tetracycline. We are going to send this to [facility] for a topical antibiotic which is something that we have had good success with recently in large venous ulcers with a lot of purulent drainage. There would not be an easy oral alternative here possibly line escalated and ciprofloxacin if we need to use systemic antibiotic 4/15; difficult area on the left medial ankle. Most likely chronic venous insufficiency. I think she will probably need venous reflux study I think she had DVT rule outs but not venous reflux studies. We have not yet obtained the topical antibiotics. She has  home health changing the dressing we have been using Sorbact for adherent fibrinous debris on the surface. Very difficult to remove 4/22; patient presents for 1 week follow-up. She has been using sore back under compression wraps and these are changed 3 times a week with home health. She also had Keystone antibiotics sent to  her house and brought them in today. She has no complaints or issues today. 5/2; patient is here for follow-up. She has been using Sorbact under compression. Very painful wound. She has been using Keystone antibiotics. Not much improvement although the more medial part of the wound has cleaned up nicely and the larger part of the wound about 50% slough covered. Part of the issue here is that she had stays at both [facility] wound care center, [facility] wound care center and now [place]. Not sure if she has had venous reflux studies. As far as we are able to tell she did not have a biopsy. My notes state that she did not have venous reflux studies just DVT rule outs. 5/16; patient goes for venous reflux studies this afternoon. She says that wound was biopsied which sounds like punch biopsies by Dr. Lynden Ang we do not have these results. We are using Sorbact. Very difficult wound to debride 5/23; patient presents for 1 week follow-up. She reports tolerating the wraps well with sorbact underneath. She had ABIs and venous reflux studies done. She has no issues or complaints today. She denies acute signs of infection. 6/6; I have reviewed the patient's vascular studies. Wounds are on the left medial and posterior calf. She had significant reflux in the greater saphenous vein in the in the mid thigh, distal thigh knee and the small saphenous vein in the popliteal fossa. The vein diameters do not look too impressive though. She is going to see the vascular surgeon on Wednesday. She also had venous reflux in the right common femoral vein. She did not have any evidence of a DVT or SVT I am wondering whether there is an ablation procedure that would benefit her in the greater saphenous vein on the left. . She tells [place] that home health put the dressing on too tight and she took off 1 layer. The swelling in her left leg is a little worse as a result of this. Not much change in the wound measurements so the surface of the  wound looks better Her arterial studies showed an ABI on the right of 1.14 with a triphasic waveform and a great toe pressure of 0.89. On the left her ABIs were noncompressible at 1.34 but with triphasic waveforms and a TBI of 0.97. Her greater toe pressure was 122. There was no evidence of significant bilateral arterial disease 6/20 patient went to see Dr. Durwin Nora. He did not think she had significant arterial disease. In terms of her venous duplex on the right side there was no evidence of a DVT or SVT there was deep venous reflux involving the common femoral vein no superficial vein reflux on the left side there was no DVT or SVT there was no deep vein graft reflux there was some reflux in the greater saphenous vein from the mid thigh to the knee but the vein here was not dilated. He thought these were venous wounds he prescribed a compression pump but I am not sure who we ordered it from The patient has been approved for Apligraf. Still using silver collagen this week 7/5; Apligraf #1 7/19 Apligraf #2. Decent improvement in the condition of  the wound bed. Epithelialization distal 8/2 Apligraf #3. No issues or complaints. Denies signs of infection. 8/17 Apligraf #4. No issues or concerns. Some complaints of pruritus and the rash. Our intake nurse brought up the fact that she had previously indicated possible cotton layer sensitivity we will therefore use kerlix in the bottom layer of the compression 8/30; the patient comes in with the area on her left medial leg just about healed. There is a superficial area more towards the tibia and a smaller open area Jamie [name], Jamie [name] ([id number]) [id number] Page 2 of 11 distally everything else is epithelialized. I do not think she requires another Apligraf 9/13; left medial leg is healed. 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 Her edema control is  good Readmisstion: 05-22-2022 upon evaluation today patient presents for initial inspection here in our clinic concerning issues that she has been having with a wound of the right lateral lower extremity. This is an area of a previous skin graft she tells me. With that being said she unfortunately had a scrape on this that occurred around 10 January. Since that time she has noted that this has just continued to get bigger and bigger in her niece who is present with her today actually states that 2 weeks ago when she saw that it was significantly smaller than what she sees currently. Obviously this is of utmost concern as we do not want this to continue to get larger when arrested and get moving in the right direction. Fortunately there does not appear to be any signs of systemic infection though locally I think we probably do have some infection present. I would obtain a culture to see what we have going on here and then we will subsequently see where things go going forward. Fortunately I think that she is in the right place at this point being at the wound center we can definitely do something to try to get this moving in the right direction. Patient does have a history of chronic venous insufficiency as well as coronary artery disease. In the past she has done well with compression wraps on the start with a 3 layer wrap I think she is probably can end up going to a 4-layer at some point but we will see how things do over the next week. She has been using wound cleanser along with she tells me a zinc and topical but again I am not sure exactly what that was. 05-29-2022 upon evaluation today patient appears to be doing well currently in regard to her wound. This actually is showing signs of improvement I am happy in that regard unfortunately her left leg has an area on the shin that has opened. This is the region where one of the areas at least we have previously taken care of. Nonetheless this is  small hoping we get it under control before things worsen significantly. 06-05-2022 upon evaluation today patient appears to be doing well currently in regard to her wounds. The actually seem to be fairly clean she is having still quite a bit of problem with pain at this point she would like to not have debridement today for all possible. With that being said I do believe that we are making some good progress I think the [facility] is doing a decent job here. 3/6; patient has had 3/6; patient with known severe chronic venous insufficiency. She apparently had a traumatic wound at home and has a  reasonably substantial wound on the right lateral lower leg and a smaller one on the left anterior lower leg as well. We have been using Hydrofera Blue and Unna boots 3/13; patient presents for follow-up. We have been using Hydrofera Blue under Coflex to the legs bilaterally. She has no issues or complaints today. 06-26-2022 upon evaluation today patient appears to be doing well currently in regard to her wound. This is measuring a little bit larger however. Fortunately I do not see any signs of infection this is on the right side. Fortunately the left side is actually completely healed which is great news. 07-03-2022 patient's wound unfortunately is continuing to show signs of being worse. Her compression which was the Tubigrip that I thought should be able to pull up actually slipped down and she was not able to pull that back up. With that being said that means that unfortunately her wound has continued to deteriorate since I last saw her. This is definitely not the direction that we are looking for. I discussed with her that I do believe we need to go ahead and see about getting her started on antibiotics and I subsequently would also like to go ahead and see about getting things moving forward with regard to a wound culture and making a change up her dressings as well but this can be changed more frequently  than just once a week. 07-10-2022 upon evaluation today patient actually appears to be doing much better. I do believe that the antibiotics have been beneficial for her. Fortunately I do not see any signs of active infection locally nor systemically which is great news. No fevers, chills, nausea, vomiting, or diarrhea. 07-17-2022 upon evaluation today patient appears to be doing well currently in regard to her wound which is actually showing signs of improvement this is slow but nonetheless prevalent that we are seeing good improvement here. Fortunately I do not see any signs of active infection locally nor systemically which is great news. 07-24-2022 upon evaluation today patient appears to be doing well currently in regard to her wound although the wrap that we had on has been slipping down and she really does not have anybody to help her with this at home health is not going to be coming out we could not get anybody that would actually be able to do the dressing changes. Fortunately I do not see any signs of active infection locally nor systemically which is great news. 08-07-22 poorly in regard to her leg compared to what it was previous. Fortunately there does not appear to be any signs of active infection locally nor systemically which is great news. No fevers, chills, nausea, vomiting, or diarrhea. With that being said it does appear that the patient may have some cellulitis in the leg which is not good. 08-14-2022 upon evaluation today patient appears to be doing somewhat better in regard to her wounds she still is having quite a bit of pain we are still not where we want to be as far as healing is concerned completely. Fortunately I do not see any evidence of active infection locally nor systemically which is great news and I am very pleased in that regard. 08-21-23 upon evaluation today patient appears to be doing poorly currently in regard to her wound. She has been tolerating the dressing changes  without complication. Fortunately there does not appear to be any signs of active infection locally nor systemically at this time. 08-28-2022 upon evaluation today patient appears to be doing  poorly in regard to her wound this was not wrapped appropriately home health did not go up high enough on the wrap. This has caused some issues and I discussed that with the patient today. I do believe that she is going to require aggressive wrapping and treatment which she is getting the [facility] topical antibiotics today they can start using that at the next wrap on Friday. In the meantime they need to make sure that the rapid and appropriately we wrote very specific orders today. 09-04-2022 upon evaluation today patient appears to be doing well currently in regard to her wound which I think is making progress is still hurting her quite significantly. Fortunately I do not see any signs of active infection locally or systemically which is great news. No fevers, chills, nausea, vomiting, or diarrhea. 09-11-2022 upon evaluation today patient's wound actually is showing signs of being a little bit smaller and looking a little bit better she still has a lot going on here however. Fortunately I do not see any evidence of active infection Worsening locally nor systemically which is great news. With that being said worsening still continue to use the topical Keystone antibiotics. 09-18-2022 upon evaluation today patient actually showing some signs of improvement. I am actually very pleased with where we stand compared to where we have been. I think that she is making good headway here. She is still having quite a bit of pain but it seems to be lessening compared to previous. 09-25-2022 upon evaluation patient's wound actually showing signs slowly but surely of improvement. Fortunately I do not see any signs of active infection at this time systemically and locally I feel like this is dramatically improved. She is doing well  with the [facility] topical antibiotics. 10-02-2022 upon evaluation today patient appears to be doing better in regard to her wound overall. I am very pleased with where things stand from a visual standpoint I do not see any signs of active infection and in general I think that we are moving in the right direction here. 10-09-2022 upon evaluation today patient appears to be doing well currently in regard to her wound. She has been tolerating the dressing changes without complication. Fortunately I do not see any signs of active infection at this time which is great news. 10-16-2022 upon evaluation today patient continues to have quite a bit of pain in regard to her right leg. We have been attempting to debride little by little as we could but again was pretty limited by the fact that she is having significant discomfort. We do not actually have any signs of active infection going on at this point that the [facility] topical antibiotics have been helping quite readily. I been attempting to do as much debridement as I can but if she were to have pain medication this would actually help and in the past when she did it was much more tolerable for her as far as taking the edge off. Right now she has been without as she is in transition from her previous pain management physician to someone new to manage this. Jamie [name], Jamie [name] ([id number]) [id number] Page 3 of 11 10-23-2022 upon evaluation patient appears to be doing excellent in regard to her wound. She has been tolerating the dressing changes without complication. Fortunately I do not see any evidence of active infection locally nor systemically which is great news. No fevers, chills, nausea, vomiting, or diarrhea. 7/24; patient with a wound secondary to chronic venous insufficiency on the right lateral  lower leg. We have been using Keystone antibiotic Hydrofera Blue under kerlix Coban. She complains of a lot of pain after last  week's debridement otherwise things appear to be improved per discussion with our intake nurse. 11-06-2022 upon evaluation today patient appears to be doing excellent at this point in regard to her wound. She has been tolerating the dressing changes without complication and to be honest she is making excellent progress towards complete closure. I am actually very pleased with where we stand I think that she is doing excellent as far as the overall appearance of the wound is concerned as well. She is going require some debridement however. 11-20-2022 upon evaluation today patient appears to be doing well currently in regard to her wound. She has been tolerating the dressing changes without complication. Fortunately there does not appear to be any signs of active infection locally or systemically which is great news and in general I do believe that we are moving in the right direction here. No fevers, chills, nausea, vomiting, or diarrhea. 11-27-2022 upon evaluation today patient appears to be doing a little better in regards to the overall appearance of her wound but unfortunately she is having increased pain. The collagen seems to be getting very dry and stuck to the wound bed which is causing her some issues here. Fortunately I do not see any signs of active infection locally nor systemically at this time. Fortunately I think that her wound is better but unfortunately her pain has not. For that reason I am going to avoid any debridement today since the patient is very upset about the pain and how bad this is hurting at this point. I think we can have to try something a little bit different I am thinking PolyMem may be a good option. 8/28; this patient has difficult venous wounds on the right lateral lower leg and ankle in the setting of chronic venous insufficiency. We have been using polymen under compression with kerlix Coban. 12-11-2022 upon evaluation today patient appears to be doing well currently in  regard to her wound. She has been tolerating the dressing changes without complication and actually appears to be doing much better this week compared to when I saw her 2 weeks ago. The wound is measuring significantly smaller. We are using PolyMem along with Kerlix and Coban. 12-18-2022 upon evaluation today patient appears to be doing well currently in regard to her wound. This is actually showing signs of improvement is measuring a little bit smaller and looking better. Fortunately I do not see any evidence of worsening overall and I believe that we will making good headway with the PolyMem and the [facility] topical antibiotics. 12-25-2022 upon evaluation today patient appears to be doing well currently in regard to her wound. She has been tolerating the dressing changes without complication. Fortunately I do not see any evidence of infection locally or systemically which is great news and in general I do believe that we will make an excellent headway towards complete closure also excellent news. 01-01-2023 upon evaluation patient's wound actually showing signs of improvement the wrap was actually put on properly this week and this is good news. Overall I am extremely happy with where things stand and how this appears today I do not see any signs of worsening overall and I believe that the patient is making excellent headway towards complete closure which is great news. 01/08/2023 upon evaluation today patient appears to be doing well currently in regard to her leg. She is  actually draining much less unfortunately home health is just not doing very well getting the supplies necessary in order to continue to take care of the patient. They are now telling me they cannot get the PolyMem which is something that has been doing really well for her. That really does not make sense to me you came to get PolyMem for a hospice patient. Nonetheless either way I think we may need to just discontinue treatment with  home health and just manage the patient here at the clinic. 01-15-2023 upon evaluation today patient appears to be doing well currently in regard to her wound. She has been tolerating the dressing changes without complication. Fortunately I do not see any signs of infection I think she is doing quite well and very pleased with where we stand today. No fevers, chills, nausea, vomiting, or diarrhea. 01-22-2023 upon evaluation today patient appears to be doing well currently in regard to her wound. She has been tolerating the dressing changes without complication and both wounds are measuring smaller today and look to be doing excellent. I am actually very pleased with what ever standing here and I think that she is making really good headway towards closure which is great news. 01-29-2023 upon evaluation today patient actually tells me she has been having some increased pain. Subsequently it took a little bit of drilling down but in the end I realized that we actually ended up putting a full Unna boot on her last week as opposed to just using a good anchor at the top and bottom. I think this is what wound up with her having increased discomfort because she tells me as she was doing well when she came in last time and by the time she left she tells me this was already getting significantly worse. And she hurt most of the week. With that being said I do believe that the patient is doing quite well at this point with regard to her wounds and the wrap did okay it was not a problem other than the increased pain I think we may benefit from a Urgo K2 light compression wrap as this will be much more comfortable I think compared to what we have been doing with just the Kerlix Coban anyway. 02-05-2023 upon evaluation today patient appears to be doing well currently in regard to her wounds. She has been tolerating the dressing changes without complication. Fortunately I do not see any signs of active infection  looking systemically which is great news. No fevers, chills, nausea, vomiting, or diarrhea. 11/6; patient I remember from a previous stay in this clinic With a left lower extremity wound.. She has a right lower extremity venous wound. She has been using Keystone, polymen under [employer]. Her wound now is to remaining open areas. There is no evidence of infection although she is tender in the area. I wonder whether there is stasis dermatitis here. She reminds me that she had some form of skin graft on this area during his stay at the [facility] wound care center presumably in around 2022. 11/13; difficult wound on the right lateral lower extremity. She has a history of venous wounds in this area and a history of a skin graft in this area. She can planes about a lot of pain but she has not been systemically unwell. No fever chills etc. 11/20; continue difficult wounds on the right lateral lower extremity. She has a history of venous wounds in this area and a history of skin  graft in this area. She continues to complain of pain. She comes in today with completely nonviable surfaces. She has been using [employer] under and Urgo K2 light 12/3; significant improvement in wound dimensions still 2 open wounds which were initially part of the larger single wound. We have been using Keystone polymen under [employer]. The wounds are smaller today. The surface does not look as viable as I might like although she does not tolerate mechanical debridement at all Electronic Signature(s) Signed: 03/13/2023 4:45:28 AM By: [name] MD Entered By: [name] on 03/12/2023 08:41:19 [name] ([id number]) [id number] Page 4 of 11 -------------------------------------------------------------------------------- Physical Exam Details Patient Name: Date of Service: Jamie [name], [name]. 03/12/2023 10:30 A M Medical Record Number: [id number] Patient Account Number:  [id number] Date of Birth/Sex: Treating RN: [birth year] (82 y.o. F) Primary Care Provider: Belva Agee Other Clinician: Referring Provider: Treating Provider/Extender: Elby Beck in Treatment: 42 Constitutional Sitting or standing Blood Pressure is within target range for patient.. Pulse regular and within target range for patient.Marland Kitchen Respirations regular, non-labored and within target range.. Temperature is normal and within the target range for the patient.Marland Kitchen Appears in no distress. Cardiovascular Pedal pulses are palpable. Edema control is good. She has some periwound discoloration although I think most of this is chronic. The wounds themselves have a less than 100% viable surface although I am not going to push debridement on this woman who tolerates it very badly. She still complains of pain but I see no evidence of cellulitis, DVT ischemia etc.. , Notes Wound exam; 2 open areas remain although they measure smaller. No mechanical debridement. No evidence of infection no calf tenderness. Electronic Signature(s) Signed: 03/13/2023 4:45:28 AM By: [name] MD Entered By: [name] on 03/12/2023 08:43:12 -------------------------------------------------------------------------------- Physician Orders Details Patient Name: Date of Service: Jamie [name], [name] 03/12/2023 10:30 A M Medical Record Number: [id number] Patient Account Number: [id number] Date of Birth/Sex: Treating RN: May 26, 1940 (82 y.o. [name], Lauren Primary Care Provider: Belva Agee Other Clinician: Referring Provider: Treating Provider/Extender: Elby Beck in Treatment: 59 Verbal / Phone Orders: No Diagnosis Coding Follow-up Appointments ppointment in 1 week. - Dr. Leanord Hawking Wednesday Return A ppointment in 2 weeks. - Dr. Leanord Hawking Return A Return appointment in 3 weeks. - Dr. Leanord Hawking Return appointment in 1 month. - Dr. Leanord Hawking Anesthetic Wound #5  Right,Lateral Lower Leg (In clinic) Topical Lidocaine 4% applied to wound bed Bathing/ Shower/ Hygiene May shower and wash wound with soap and water. - with dressing changes. Edema Control - Orders / Instructions Elevate legs to the level of the heart or above for 30 minutes daily and/or when sitting for 3-4 times a day throughout the day. Avoid standing for long periods of time. Exercise regularly - As tolerated Wound Treatment Wound #5 - Lower Leg Wound Laterality: Right, Lateral Cleanser: Soap and Water 1 x Per Week/30 Days Discharge Instructions: May shower and wash wound with dial antibacterial soap and water prior to dressing change. Peri-Wound Care: Sween Lotion (Moisturizing lotion) 1 x Per Week/30 Days MAYLEN, HAMMAN ([id number]) [id number] Page 5 of 11 Discharge Instructions: Apply moisturizing lotion as directed Topical: Skintegrity Hydrogel 4 (oz) 1 x Per Week/30 Days Discharge Instructions: Apply hydrogel with sorbact Topical: compounding topical antibiotics 1 x Per Week/30 Days Discharge Instructions: apply Keystone directly to wound bed under the hydrofera blue. Home Health start. MIX ACCORDING TO THE PHARMACY INSTRUCTIONS. Prim Dressing: Cutimed Sorbact Swab 1 x  Per Week/30 Days ary Discharge Instructions: Apply to wound bed with Hydrogel Secondary Dressing: ABD Pad, 8x10 1 x Per Week/30 Days Discharge Instructions: Apply over primary dressing as directed. Secondary Dressing: Woven Gauze Sponge, Non-Sterile 4x4 in 1 x Per Week/30 Days Discharge Instructions: Apply over primary dressing as directed. Secondary Dressing: Zetuvit Plus 4x8 in 1 x Per Week/30 Days Discharge Instructions: Or double ABD pad Apply over primary dressing as directed. Compression Wrap: Kerlix Roll 4.5x3.1 (in/yd) 1 x Per Week/30 Days Discharge Instructions: Apply Kerlix and Coban compression as directed. Compression Wrap: Coban Self-Adherent Wrap 4x5 (in/yd) 1 x  Per Week/30 Days Discharge Instructions: Apply over Kerlix as directed. Compression Wrap: unna boot FIRST LAYER **** SEE INSTRUCTIONS 1 x Per Week/30 Days Discharge Instructions: APPLY FIRST LAYER UNNA BOOT AT BASES OF TOES AND JUST BELOW THE KNEE TO HOLD COMPRESSION WRAP IN PLACE. Wound #7 - Lower Leg Wound Laterality: Right, Posterior Cleanser: Soap and Water 1 x Per Week/30 Days Discharge Instructions: May shower and wash wound with dial antibacterial soap and water prior to dressing change. Peri-Wound Care: Sween Lotion (Moisturizing lotion) 1 x Per Week/30 Days Discharge Instructions: Apply moisturizing lotion as directed Topical: Skintegrity Hydrogel 4 (oz) 1 x Per Week/30 Days Discharge Instructions: Apply hydrogel with sorbact Topical: compounding topical antibiotics 1 x Per Week/30 Days Discharge Instructions: apply Keystone directly to wound bed under the hydrofera blue. Home Health start. MIX ACCORDING TO THE PHARMACY INSTRUCTIONS. Prim Dressing: Cutimed Sorbact Swab 1 x Per Week/30 Days ary Discharge Instructions: Apply to wound bed with Hydrogel Secondary Dressing: ABD Pad, 8x10 1 x Per Week/30 Days Discharge Instructions: Apply over primary dressing as directed. Secondary Dressing: Woven Gauze Sponge, Non-Sterile 4x4 in 1 x Per Week/30 Days Discharge Instructions: Apply over primary dressing as directed. Secondary Dressing: Zetuvit Plus 4x8 in 1 x Per Week/30 Days Discharge Instructions: Or double ABD pad Apply over primary dressing as directed. Compression Wrap: Kerlix Roll 4.5x3.1 (in/yd) 1 x Per Week/30 Days Discharge Instructions: Apply Kerlix and Coban compression as directed. Compression Wrap: Coban Self-Adherent Wrap 4x5 (in/yd) 1 x Per Week/30 Days Discharge Instructions: Apply over Kerlix as directed. Compression Wrap: unna boot FIRST LAYER **** SEE INSTRUCTIONS 1 x Per Week/30 Days Discharge Instructions: APPLY FIRST LAYER UNNA BOOT AT BASES OF TOES AND JUST BELOW  THE KNEE TO HOLD COMPRESSION WRAP IN PLACE. Electronic Signature(s) Signed: 03/12/2023 4:12:43 PM By: Fonnie Mu RN Signed: 03/13/2023 4:45:28 AM By: [name] MD Entered By: Fonnie Mu on 03/12/2023 08:11:26 [name] ([id number]) [id number] Page 6 of 11 -------------------------------------------------------------------------------- Problem List Details Patient Name: Date of Service: Jamie [name], [name]. 03/12/2023 10:30 A M Medical Record Number: [id number] Patient Account Number: [id number] Date of Birth/Sex: Treating RN: 11-21-1940 (82 y.o. 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 03/12/2023 10:30 A M Medical Record Number: [id number] Patient Account Number: [id number] Date  of Birth/Sex: Treating RN: 04-01-1941 (82 y.o. F) Primary Care Provider: Belva Agee Other Clinician: Referring Provider: Treating Provider/Extender: Elby Beck in Treatment: 42 Subjective History of Present Illness (HPI) ADMISSION 06/30/2020 Mrs. Ostermeyer is an 82 year old woman who lives in  [place]. She is here with her niece for review of wounds on the left medial lower leg and ankle. These have apparently been present for over a year and she followed with Dr. Olegario Messier at the [facility] in [place] for quite a period of time although it looks as though there was a initial consult wound from Dr. Marcha Solders on February 18 presumably there was therefore hiatus. At that point the wounds were described as being there for 3 months. She also tells me she was at the wound care center in [place] for a period of time with this. 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 She had venous studies that were negative for DVT ABIs on the right were 1.01 on the left 1.06. She has . had previous applications of puraply, compression which she does not tolerate very well. She has had several rounds of oral antibiotic therapy. She complains of unrelenting pain and she is seeing Dr. Reece Agar V of pain management apparently was on oxycodone but that did not help. She also had a skin biopsy done by Dr. Olegario Messier although we do not have that result. She does not appear to have an arterial issue. I am not completely clear what she has been putting on the wounds lately. Jamie [name], Jamie [name] ([id number]) [id number] Page 7 of 11 3/31; this patient has a particularly nasty set of wounds on the left medial ankle in the middle of what looks to be hemosiderin deposition secondary to chronic venous insufficiency. She has a lot of pain followed by pain management. Dr. Marcha Solders apparently did a biopsy of something on the left leg last fall what I would like to get this result. She has a history of MRSA treatment. I do not believe she had reflux studies but she did have DVT rule out studies. Previous ABIs have not suggested arterial insufficiency 4/7; difficult wounds on the left medial ankle probably chronic venous insufficiency. With considerable  effort on behalf of our case manager we were able to finally to speak to somebody at the hospital in [place] who indicated that no biopsy of this area have been done even though the patient describes this in some detail and is even able to point out where she thinks the biopsy was done. We have been using Sorbact. The PCR culture I did showed polymicrobial identification with Pseudomonas, staph aureus, Peptostreptococcus. All of this and low titers. Resistance genes identified were MRSA, staph virulence gene and tetracycline. We are going to send this to [facility] for a topical antibiotic which is something that we have had good success with recently in large venous ulcers with a lot of purulent drainage. There would not be an easy oral alternative here possibly line escalated and ciprofloxacin if we need to use systemic antibiotic 4/15; difficult area on the left medial ankle. Most likely chronic venous insufficiency. I think she will probably need venous reflux study I think she had DVT rule outs but not venous reflux studies. We have not yet obtained the topical antibiotics. She has home health changing the dressing we have been using Sorbact for adherent fibrinous debris on the surface. Very difficult to remove 4/22; patient presents for 1 week follow-up.  She has been using sore back under compression wraps and these are changed 3 times a week with home health. She also had Keystone antibiotics sent to her house and brought them in today. She has no complaints or issues today. 5/2; patient is here for follow-up. She has been using Sorbact under compression. Very painful wound. She has been using Keystone antibiotics. Not much improvement although the more medial part of the wound has cleaned up nicely and the larger part of the wound about 50% slough covered. Part of the issue here is that she had stays at both [facility] wound care center, [facility] wound care center and now [place]. Not sure if she has had  venous reflux studies. As far as we are able to tell she did not have a biopsy. My notes state that she did not have venous reflux studies just DVT rule outs. 5/16; patient goes for venous reflux studies this afternoon. She says that wound was biopsied which sounds like punch biopsies by Dr. Lynden Ang we do not have these results. We are using Sorbact. Very difficult wound to debride 5/23; patient presents for 1 week follow-up. She reports tolerating the wraps well with sorbact underneath. She had ABIs and venous reflux studies done. She has no issues or complaints today. She denies acute signs of infection. 6/6; I have reviewed the patient's vascular studies. Wounds are on the left medial and posterior calf. She had significant reflux in the greater saphenous vein in the in the mid thigh, distal thigh knee and the small saphenous vein in the popliteal fossa. The vein diameters do not look too impressive though. She is going to see the vascular surgeon on Wednesday. She also had venous reflux in the right common femoral vein. She did not have any evidence of a DVT or SVT I am wondering whether there is an ablation procedure that would benefit her in the greater saphenous vein on the left. . She tells [place] that home health put the dressing on too tight and she took off 1 layer. The swelling in her left leg is a little worse as a result of this. Not much change in the wound measurements so the surface of the wound looks better Her arterial studies showed an ABI on the right of 1.14 with a triphasic waveform and a great toe pressure of 0.89. On the left her ABIs were noncompressible at 1.34 but with triphasic waveforms and a TBI of 0.97. Her greater toe pressure was 122. There was no evidence of significant bilateral arterial disease 6/20 patient went to see Dr. Durwin Nora. He did not think she had significant arterial disease. In terms of her venous duplex on the right side there was no evidence of a DVT or SVT  there was deep venous reflux involving the common femoral vein no superficial vein reflux on the left side there was no DVT or SVT there was no deep vein graft reflux there was some reflux in the greater saphenous vein from the mid thigh to the knee but the vein here was not dilated. He thought these were venous wounds he prescribed a compression pump but I am not sure who we ordered it from The patient has been approved for Apligraf. Still using silver collagen this week 7/5; Apligraf #1 7/19 Apligraf #2. Decent improvement in the condition of the wound bed. Epithelialization distal 8/2 Apligraf #3. No issues or complaints. Denies signs of infection. 8/17 Apligraf #4. No issues or concerns. Some complaints of pruritus and  the rash. Our intake nurse brought up the fact that she had previously indicated possible cotton layer sensitivity we will therefore use kerlix in the bottom layer of the compression 8/30; the patient comes in with the area on her left medial leg just about healed. There is a superficial area more towards the tibia and a smaller open area distally everything else is epithelialized. I do not think she requires another Apligraf 9/13; left medial leg is healed. 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 Her edema control is good Readmisstion: 05-22-2022 upon evaluation today patient presents for initial inspection here in our clinic concerning issues that she has been having with a wound of the right lateral lower extremity. This is an area of a previous skin graft she tells me. With that being said she unfortunately had a scrape on this that occurred around 10 January. Since that time she has noted that this has just continued to get bigger and bigger in her niece who is present with her today actually states that 2 weeks ago when she saw that it was significantly smaller than what she sees currently. Obviously this is of  utmost concern as we do not want this to continue to get larger when arrested and get moving in the right direction. Fortunately there does not appear to be any signs of systemic infection though locally I think we probably do have some infection present. I would obtain a culture to see what we have going on here and then we will subsequently see where things go going forward. Fortunately I think that she is in the right place at this point being at the wound center we can definitely do something to try to get this moving in the right direction. Patient does have a history of chronic venous insufficiency as well as coronary artery disease. In the past she has done well with compression wraps on the start with a 3 layer wrap I think she is probably can end up going to a 4-layer at some point but we will see how things do over the next week. She has been using wound cleanser along with she tells me a zinc and topical but again I am not sure exactly what that was. 05-29-2022 upon evaluation today patient appears to be doing well currently in regard to her wound. This actually is showing signs of improvement I am happy in that regard unfortunately her left leg has an area on the shin that has opened. This is the region where one of the areas at least we have previously taken care of. Nonetheless this is small hoping we get it under control before things worsen significantly. 06-05-2022 upon evaluation today patient appears to be doing well currently in regard to her wounds. The actually seem to be fairly clean she is having still quite a bit of problem with pain at this point she would like to not have debridement today for all possible. With that being said I do believe that we are making some good progress I think the [facility] is doing a decent job here. 3/6; patient has had 3/6; patient with known severe chronic venous insufficiency. She apparently had a traumatic wound at home and has a  reasonably substantial wound on the right lateral lower leg and a smaller one on the left anterior lower leg as well. We have been using Hydrofera Blue and Unna boots 3/13; patient presents for follow-up. We have  been using Hydrofera Blue under [employer] to the legs bilaterally. She has no issues or complaints today. 06-26-2022 upon evaluation today patient appears to be doing well currently in regard to her wound. This is measuring a little bit larger however. Fortunately I do not see any signs of infection this is on the right side. Fortunately the left side is actually completely healed which is great news. 07-03-2022 patient's wound unfortunately is continuing to show signs of being worse. Her compression which was the Tubigrip that I thought should be able to pull up actually slipped down and she was not able to pull that back up. With that being said that means that unfortunately her wound has continued to deteriorate since I last saw her. This is definitely not the direction that we are looking for. I discussed with her that I do believe we need to go ahead and see about getting her started on antibiotics and I subsequently would also like to go ahead and see about getting things moving forward with regard to a wound culture and making a change up her dressings as well but this can be changed more frequently than just once a week. Jamie [name], Jamie [name] ([id number]) [id number] Page 8 of 11 07-10-2022 upon evaluation today patient actually appears to be doing much better. I do believe that the antibiotics have been beneficial for her. Fortunately I do not see any signs of active infection locally nor systemically which is great news. No fevers, chills, nausea, vomiting, or diarrhea. 07-17-2022 upon evaluation today patient appears to be doing well currently in regard to her wound which is actually showing signs of improvement this is slow but nonetheless prevalent that we are  seeing good improvement here. Fortunately I do not see any signs of active infection locally nor systemically which is great news. 07-24-2022 upon evaluation today patient appears to be doing well currently in regard to her wound although the wrap that we had on has been slipping down and she really does not have anybody to help her with this at home health is not going to be coming out we could not get anybody that would actually be able to do the dressing changes. Fortunately I do not see any signs of active infection locally nor systemically which is great news. 08-07-22 poorly in regard to her leg compared to what it was previous. Fortunately there does not appear to be any signs of active infection locally nor systemically which is great news. No fevers, chills, nausea, vomiting, or diarrhea. With that being said it does appear that the patient may have some cellulitis in the leg which is not good. 08-14-2022 upon evaluation today patient appears to be doing somewhat better in regard to her wounds she still is having quite a bit of pain we are still not where we want to be as far as healing is concerned completely. Fortunately I do not see any evidence of active infection locally nor systemically which is great news and I am very pleased in that regard. 08-21-23 upon evaluation today patient appears to be doing poorly currently in regard to her wound. She has been tolerating the dressing changes without complication. Fortunately there does not appear to be any signs of active infection locally nor systemically at this time. 08-28-2022 upon evaluation today patient appears to be doing poorly in regard to her wound this was not wrapped appropriately home health did not go up high enough on the wrap. This has caused some issues and I  discussed that with the patient today. I do believe that she is going to require aggressive wrapping and treatment which she is getting the [facility] topical antibiotics today  they can start using that at the next wrap on Friday. In the meantime they need to make sure that the rapid and appropriately we wrote very specific orders today. 09-04-2022 upon evaluation today patient appears to be doing well currently in regard to her wound which I think is making progress is still hurting her quite significantly. Fortunately I do not see any signs of active infection locally or systemically which is great news. No fevers, chills, nausea, vomiting, or diarrhea. 09-11-2022 upon evaluation today patient's wound actually is showing signs of being a little bit smaller and looking a little bit better she still has a lot going on here however. Fortunately I do not see any evidence of active infection Worsening locally nor systemically which is great news. With that being said worsening still continue to use the topical Keystone antibiotics. 09-18-2022 upon evaluation today patient actually showing some signs of improvement. I am actually very pleased with where we stand compared to where we have been. I think that she is making good headway here. She is still having quite a bit of pain but it seems to be lessening compared to previous. 09-25-2022 upon evaluation patient's wound actually showing signs slowly but surely of improvement. Fortunately I do not see any signs of active infection at this time systemically and locally I feel like this is dramatically improved. She is doing well with the [facility] topical antibiotics. 10-02-2022 upon evaluation today patient appears to be doing better in regard to her wound overall. I am very pleased with where things stand from a visual standpoint I do not see any signs of active infection and in general I think that we are moving in the right direction here. 10-09-2022 upon evaluation today patient appears to be doing well currently in regard to her wound. She has been tolerating the dressing changes without complication. Fortunately I do not see any  signs of active infection at this time which is great news. 10-16-2022 upon evaluation today patient continues to have quite a bit of pain in regard to her right leg. We have been attempting to debride little by little as we could but again was pretty limited by the fact that she is having significant discomfort. We do not actually have any signs of active infection going on at this point that the [facility] topical antibiotics have been helping quite readily. I been attempting to do as much debridement as I can but if she were to have pain medication this would actually help and in the past when she did it was much more tolerable for her as far as taking the edge off. Right now she has been without as she is in transition from her previous pain management physician to someone new to manage this. 10-23-2022 upon evaluation patient appears to be doing excellent in regard to her wound. She has been tolerating the dressing changes without complication. Fortunately I do not see any evidence of active infection locally nor systemically which is great news. No fevers, chills, nausea, vomiting, or diarrhea. 7/24; patient with a wound secondary to chronic venous insufficiency on the right lateral lower leg. We have been using Keystone antibiotic Hydrofera Blue under kerlix Coban. She complains of a lot of pain after last week's debridement otherwise things appear to be improved per discussion with our intake nurse. 11-06-2022  upon evaluation today patient appears to be doing excellent at this point in regard to her wound. She has been tolerating the dressing changes without complication and to be honest she is making excellent progress towards complete closure. I am actually very pleased with where we stand I think that she is doing excellent as far as the overall appearance of the wound is concerned as well. She is going require some debridement however. 11-20-2022 upon evaluation today patient appears to be doing  well currently in regard to her wound. She has been tolerating the dressing changes without complication. Fortunately there does not appear to be any signs of active infection locally or systemically which is great news and in general I do believe that we are moving in the right direction here. No fevers, chills, nausea, vomiting, or diarrhea. 11-27-2022 upon evaluation today patient appears to be doing a little better in regards to the overall appearance of her wound but unfortunately she is having increased pain. The collagen seems to be getting very dry and stuck to the wound bed which is causing her some issues here. Fortunately I do not see any signs of active infection locally nor systemically at this time. Fortunately I think that her wound is better but unfortunately her pain has not. For that reason I am going to avoid any debridement today since the patient is very upset about the pain and how bad this is hurting at this point. I think we can have to try something a little bit different I am thinking PolyMem may be a good option. 8/28; this patient has difficult venous wounds on the right lateral lower leg and ankle in the setting of chronic venous insufficiency. We have been using polymen under compression with kerlix Coban. 12-11-2022 upon evaluation today patient appears to be doing well currently in regard to her wound. She has been tolerating the dressing changes without complication and actually appears to be doing much better this week compared to when I saw her 2 weeks ago. The wound is measuring significantly smaller. We are using PolyMem along with Kerlix and Coban. 12-18-2022 upon evaluation today patient appears to be doing well currently in regard to her wound. This is actually showing signs of improvement is measuring a little bit smaller and looking better. Fortunately I do not see any evidence of worsening overall and I believe that we will making good headway with the PolyMem  and the [facility] topical antibiotics. 12-25-2022 upon evaluation today patient appears to be doing well currently in regard to her wound. She has been tolerating the dressing changes without complication. Fortunately I do not see any evidence of infection locally or systemically which is great news and in general I do believe that we will make an excellent headway towards complete closure also excellent news. 01-01-2023 upon evaluation patient's wound actually showing signs of improvement the wrap was actually put on properly this week and this is good news. Overall I am extremely happy with where things stand and how this appears today I do not see any signs of worsening overall and I believe that the patient is making excellent headway towards complete closure which is great news. Jamie [name], Jamie [name] ([id number]) [id number] Page 9 of 11 01/08/2023 upon evaluation today patient appears to be doing well currently in regard to her leg. She is actually draining much less unfortunately home health is just not doing very well getting the supplies necessary in order to continue to take care of the patient. They  are now telling me they cannot get the PolyMem which is something that has been doing really well for her. That really does not make sense to me you came to get PolyMem for a hospice patient. Nonetheless either way I think we may need to just discontinue treatment with home health and just manage the patient here at the clinic. 01-15-2023 upon evaluation today patient appears to be doing well currently in regard to her wound. She has been tolerating the dressing changes without complication. Fortunately I do not see any signs of infection I think she is doing quite well and very pleased with where we stand today. No fevers, chills, nausea, vomiting, or diarrhea. 01-22-2023 upon evaluation today patient appears to be doing well currently in regard to her wound. She has been  tolerating the dressing changes without complication and both wounds are measuring smaller today and look to be doing excellent. I am actually very pleased with what ever standing here and I think that she is making really good headway towards closure which is great news. 01-29-2023 upon evaluation today patient actually tells me she has been having some increased pain. Subsequently it took a little bit of drilling down but in the end I realized that we actually ended up putting a full Unna boot on her last week as opposed to just using a good anchor at the top and bottom. I think this is what wound up with her having increased discomfort because she tells me as she was doing well when she came in last time and by the time she left she tells me this was already getting significantly worse. And she hurt most of the week. With that being said I do believe that the patient is doing quite well at this point with regard to her wounds and the wrap did okay it was not a problem other than the increased pain I think we may benefit from a Urgo K2 light compression wrap as this will be much more comfortable I think compared to what we have been doing with just the Kerlix Coban anyway. 02-05-2023 upon evaluation today patient appears to be doing well currently in regard to her wounds. She has been tolerating the dressing changes without complication. Fortunately I do not see any signs of active infection looking systemically which is great news. No fevers, chills, nausea, vomiting, or diarrhea. 11/6; patient I remember from a previous stay in this clinic With a left lower extremity wound.. She has a right lower extremity venous wound. She has been using Keystone, polymen under [employer]. Her wound now is to remaining open areas. There is no evidence of infection although she is tender in the area. I wonder whether there is stasis dermatitis here. She reminds me that she had some form of skin graft on this area  during his stay at the [facility] wound care center presumably in around 2022. 11/13; difficult wound on the right lateral lower extremity. She has a history of venous wounds in this area and a history of a skin graft in this area. She can planes about a lot of pain but she has not been systemically unwell. No fever chills etc. 11/20; continue difficult wounds on the right lateral lower extremity. She has a history of venous wounds in this area and a history of skin graft in this area. She continues to complain of pain. She comes in today with completely nonviable surfaces. She has been using [employer] under and Urgo K2  light 12/3; significant improvement in wound dimensions still 2 open wounds which were initially part of the larger single wound. We have been using Keystone polymen under [employer]. The wounds are smaller today. The surface does not look as viable as I might like although she does not tolerate mechanical debridement at all Objective Constitutional Sitting or standing Blood Pressure is within target range for patient.. Pulse regular and within target range for patient.Marland Kitchen Respirations regular, non-labored and within target range.. Temperature is normal and within the target range for the patient.Marland Kitchen Appears in no distress. Vitals Time Taken: 10:49 AM, Height: 62 in, Weight: 171 lbs, BMI: 31.3, Temperature: 98 F, Pulse: 50 bpm, Respiratory Rate: 17 breaths/min, Blood Pressure: 143/78 mmHg. Cardiovascular Pedal pulses are palpable. Edema control is good. She has some periwound discoloration although I think most of this is chronic. The wounds themselves have a less than 100% viable surface although I am not going to push debridement on this woman who tolerates it very badly. She still complains of pain but I see no evidence of cellulitis, DVT ischemia etc.. , General Notes: Wound exam; 2 open areas remain although they measure smaller. No mechanical debridement. No evidence of  infection no calf tenderness. Integumentary (Hair, Skin) Wound #5 status is Open. Original cause of wound was Trauma. The date acquired was: 04/17/2022. The wound has been in treatment 42 weeks. The wound is located on the Right,Lateral Lower Leg. The wound measures 1.5cm length x 1.2cm width x 0.1cm depth; 1.414cm^2 area and 0.141cm^3 volume. There is no tunneling or undermining noted. There is a medium amount of serosanguineous drainage noted. There is large (67-100%) red, pink granulation within the wound bed. There is a small (1-33%) amount of necrotic tissue within the wound bed including Adherent Slough. The periwound skin appearance did not exhibit: Callus, Crepitus, Excoriation, Induration, Rash, Scarring, Dry/Scaly, Maceration, Atrophie Blanche, Cyanosis, Ecchymosis, Hemosiderin Staining, Mottled, Pallor, Rubor, Erythema. Periwound temperature was noted as No Abnormality. The periwound has tenderness on palpation. Wound #7 status is Open. Original cause of wound was Gradually Appeared. The date acquired was: 01/01/2023. The wound has been in treatment 10 weeks. The wound is located on the Right,Posterior Lower Leg. The wound measures 1.8cm length x 3.5cm width x 0.1cm depth; 4.948cm^2 area and 0.495cm^3 volume. There is no tunneling or undermining noted. There is a medium amount of serosanguineous drainage noted. There is large (67-100%) red, pink granulation within the wound bed. There is a small (1-33%) amount of necrotic tissue within the wound bed including Adherent Slough. The periwound skin appearance did not exhibit: Callus, Crepitus, Excoriation, Induration, Rash, Scarring, Dry/Scaly, Maceration, Atrophie Blanche, Cyanosis, Ecchymosis, Hemosiderin Staining, Mottled, Pallor, Rubor, Erythema. Periwound temperature was noted as No Abnormality. The periwound has tenderness on palpation. 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 - Dr. Leanord Hawking Wednesday Return Appointment in 2 weeks. - Dr. Leanord Hawking Return appointment in 3 weeks. - Dr. Leanord Hawking Return appointment in 1 month. - Dr. Leanord Hawking Anesthetic: Wound #5 Right,Lateral Lower Leg: (In clinic) Topical Lidocaine 4% applied to wound bed Bathing/ Shower/ Hygiene: May shower and wash wound with soap and water. - with dressing changes. Edema Control - Orders / Instructions: Elevate  legs to the level of the heart or above for 30 minutes daily and/or when sitting for 3-4 times a day throughout the day. Avoid standing for long periods of time. Exercise regularly - As tolerated WOUND #5: - Lower Leg Wound Laterality: Right, Lateral Cleanser: Soap and Water 1 x Per Week/30 Days Discharge Instructions: May shower and wash wound with dial antibacterial soap and water prior to dressing change. Peri-Wound Care: Sween Lotion (Moisturizing lotion) 1 x Per Week/30 Days Discharge Instructions: Apply moisturizing lotion as directed Topical: Skintegrity Hydrogel 4 (oz) 1 x Per Week/30 Days Discharge Instructions: Apply hydrogel with sorbact Topical: compounding topical antibiotics 1 x Per Week/30 Days Discharge Instructions: apply Keystone directly to wound bed under the hydrofera blue. Home Health start. MIX ACCORDING TO THE PHARMACY INSTRUCTIONS. Prim Dressing: Cutimed Sorbact Swab 1 x Per Week/30 Days ary Discharge Instructions: Apply to wound bed with Hydrogel Secondary Dressing: ABD Pad, 8x10 1 x Per Week/30 Days Discharge Instructions: Apply over primary dressing as directed. Secondary Dressing: Woven Gauze Sponge, Non-Sterile 4x4 in 1 x Per Week/30  Days Discharge Instructions: Apply over primary dressing as directed. Secondary Dressing: Zetuvit Plus 4x8 in 1 x Per Week/30 Days Discharge Instructions: Or double ABD pad Apply over primary dressing as directed. Com pression Wrap: Kerlix Roll 4.5x3.1 (in/yd) 1 x Per Week/30 Days Discharge Instructions: Apply Kerlix and Coban compression as directed. Com pression Wrap: Coban Self-Adherent Wrap 4x5 (in/yd) 1 x Per Week/30 Days Discharge Instructions: Apply over Kerlix as directed. Com pression Wrap: unna boot FIRST LAYER **** SEE INSTRUCTIONS 1 x Per Week/30 Days Discharge Instructions: APPLY FIRST LAYER UNNA BOOT AT BASES OF TOES AND JUST BELOW THE KNEE TO HOLD COMPRESSION WRAP IN PLACE. WOUND #7: - Lower Leg Wound Laterality: Right, Posterior Cleanser: Soap and Water 1 x Per Week/30 Days Discharge Instructions: May shower and wash wound with dial antibacterial soap and water prior to dressing change. Peri-Wound Care: Sween Lotion (Moisturizing lotion) 1 x Per Week/30 Days Discharge Instructions: Apply moisturizing lotion as directed Topical: Skintegrity Hydrogel 4 (oz) 1 x Per Week/30 Days Discharge Instructions: Apply hydrogel with sorbact Topical: compounding topical antibiotics 1 x Per Week/30 Days Discharge Instructions: apply Keystone directly to wound bed under the hydrofera blue. Home Health start. MIX ACCORDING TO THE PHARMACY INSTRUCTIONS. Prim Dressing: Cutimed Sorbact Swab 1 x Per Week/30 Days ary Discharge Instructions: Apply to wound bed with Hydrogel Secondary Dressing: ABD Pad, 8x10 1 x Per Week/30 Days Discharge Instructions: Apply over primary dressing as directed. Secondary Dressing: Woven Gauze Sponge, Non-Sterile 4x4 in 1 x Per Week/30 Days Discharge Instructions: Apply over primary dressing as directed. Secondary Dressing: Zetuvit Plus 4x8 in 1 x Per Week/30 Days Discharge Instructions: Or double ABD pad Apply over primary dressing as directed. Com pression Wrap:  Kerlix Roll 4.5x3.1 (in/yd) 1 x Per Week/30 Days Discharge Instructions: Apply Kerlix and Coban compression as directed. Com pression Wrap: Coban Self-Adherent Wrap 4x5 (in/yd) 1 x Per Week/30 Days Discharge Instructions: Apply over Kerlix as directed. Com pression Wrap: unna boot FIRST LAYER **** SEE INSTRUCTIONS 1 x Per Week/30 Days Discharge Instructions: APPLY FIRST LAYER UNNA BOOT AT BASES OF TOES AND JUST BELOW THE KNEE TO HOLD COMPRESSION WRAP IN PLACE. #1 continuous Keystone Sorbact ABDs kerlix Coban. She seems to have responded positively in terms of wound dimensions. 2. Still complains of pain although I see nothing to really be suggestive of an ancillary issue that needs to be addressed. Her family member seems to relate  that this is an ongoing issue with this patient chronically. #3 particularly no evidence of infection Electronic Signature(s) Signed: 03/13/2023 4:45:28 AM By: [name] MD Entered By: [name] on 03/12/2023 08:44:44 [name] ([id number]) [id number] Page 11 of 11 -------------------------------------------------------------------------------- SuperBill Details Patient Name: Date of Service: Jamie [name], [place] 03/12/2023 Medical Record Number: [id number] Patient Account Number: [id number] Date of Birth/Sex: Treating RN: 1940-07-09 (82 y.o. [name], Lauren Primary Care Provider: Belva Agee Other Clinician: Referring Provider: Treating Provider/Extender: Elby Beck in Treatment: 42 Diagnosis Coding ICD-10 Codes Code Description [phone] Chronic venous hypertension (idiopathic) with ulcer and inflammation of right lower extremity 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d I25.10 Atherosclerotic heart disease of native coronary artery without angina pectoris Facility Procedures : CPT4 Code: [id number] Description: 99214 - WOUND  CARE VISIT-LEV 4 EST PT Modifier: Quantity: 1 Physician Procedures : CPT4 Code Description Modifier [id number] - WC PHYS LEVEL 3 - EST PT ICD-10 Diagnosis Description I87.331 Chronic venous hypertension (idiopathic) with ulcer and inflammation of right lower extremity L97.812 Non-pressure chronic ulcer of other part  of right lower leg with fat layer exposed Quantity: 1 Electronic Signature(s) Signed: 03/13/2023 4:45:28 AM By: [name] MD Previous Signature: 03/12/2023 11:38:15 AM Version By: Fonnie Mu RN Entered By: [name] on 03/12/2023 08:45:05

## 2023-03-19 ENCOUNTER — Encounter (HOSPITAL_BASED_OUTPATIENT_CLINIC_OR_DEPARTMENT_OTHER): Payer: Medicare HMO | Admitting: Internal Medicine

## 2023-03-19 DIAGNOSIS — I87331 Chronic venous hypertension (idiopathic) with ulcer and inflammation of right lower extremity: Secondary | ICD-10-CM | POA: Diagnosis not present

## 2023-03-20 NOTE — Progress Notes (Signed)
Jamie, [name] ([id number]) [id number] Page 1 of 13 Visit Report for 03/19/2023 Debridement Details Patient Name: Date of Service: Jamie [name], [name]. 03/19/2023 11:00 A M Medical Record Number: [id number] Patient Account Number: [id number] Date of Birth/Sex: Treating RN: 02-Jul-1940 (82 y.o. F) Primary Care Provider: Belva [name] Other Clinician: Referring Provider: Treating Provider/Extender: Jamie [name] in Treatment: 43 Debridement Performed for Assessment: Wound #5 Right,Lateral Lower Leg Performed By: Physician Jamie [name] Caul., MD The following information was scribed by: Jamie [name] The information was scribed for: Jamie [name] Debridement Type: Debridement Severity of Tissue Pre Debridement: Fat layer exposed Level of Consciousness (Pre-procedure): Awake and Alert Pre-procedure Verification/Time Out Yes - 11:20 Taken: Start Time: 11:21 Percent of Wound Bed Debrided: 100% T Area Debrided (cm): otal 1.13 Tissue and other material debrided: Viable, Non-Viable, Callus, Eschar, Skin: Dermis , Skin: Epidermis Level: Skin/Epidermis Debridement Description: Selective/Open Wound Instrument: Curette Bleeding: Minimum Hemostasis Achieved: Pressure End Time: 11:31 Procedural Pain: 0 Post Procedural Pain: 0 Response to Treatment: Procedure was tolerated well Level of Consciousness (Post- Awake and Alert procedure): Post Debridement Measurements of Total Wound Length: (cm) 1.2 Width: (cm) 1.2 Depth: (cm) 0.1 Volume: (cm) 0.113 Character of Wound/Ulcer Post Debridement: Improved Severity of Tissue Post Debridement: Fat layer exposed Post Procedure Diagnosis Same as Pre-procedure Electronic Signature(s) Signed: 03/19/2023 5:18:28 PM By: Jamie Najjar MD Entered By: Jamie [name] on 03/19/2023 11:38:10 -------------------------------------------------------------------------------- Debridement  Details Patient Name: Date of Service: Jamie [name], Jamie [name] 03/19/2023 11:00 A M Medical Record Number: [id number] Patient Account Number: [id number] Date of Birth/Sex: Treating RN: [birth year] (82 y.o. F) Primary Care Provider: Belva [name] Other Clinician: Referring Provider: Treating Provider/Extender: Jamie [name] in Treatment: 43 Debridement Performed for Assessment: Wound #7 Right,Posterior Lower Leg Jamie [name], Jamie [name] ([id number]) [id number] Page 2 of 13 Performed By: Physician Jamie [name] Caul., MD The following information was scribed by: Jamie [name] The information was scribed for: Jamie [name] Debridement Type: Debridement Severity of Tissue Pre Debridement: Fat layer exposed Level of Consciousness (Pre-procedure): Awake and Alert Pre-procedure Verification/Time Out Yes - 11:20 Taken: Start Time: 11:21 Percent of Wound Bed Debrided: 100% T Area Debrided (cm): otal 0.24 Tissue and other material debrided: Viable, Non-Viable, Callus, Eschar, Skin: Dermis , Skin: Epidermis Level: Skin/Epidermis Debridement Description: Selective/Open Wound Instrument: Curette Bleeding: Minimum Hemostasis Achieved: Pressure End Time: 11:31 Procedural Pain: 0 Post Procedural Pain: 0 Response to Treatment: Procedure was tolerated well Level of Consciousness (Post- Awake and Alert procedure): Post Debridement Measurements of Total Wound Length: (cm) 1 Width: (cm) 0.3 Depth: (cm) 0.1 Volume: (cm) 0.024 Character of Wound/Ulcer Post Debridement: Improved Severity of Tissue Post Debridement: Fat layer exposed Post Procedure Diagnosis Same as Pre-procedure Electronic Signature(s) Signed: 03/19/2023 5:18:28 PM By: Jamie Najjar MD Entered By: Jamie [name] on 03/19/2023 11:38:18 -------------------------------------------------------------------------------- HPI Details Patient Name: Date of Service: Jamie [name], Jamie RGUERITE  W. 03/19/2023 11:00 A M Medical Record Number: [id number] Patient Account Number: [id number] Date of Birth/Sex: Treating RN: 13-May-1940 (82 y.o. F) Primary Care Provider: Belva [name] Other Clinician: Referring Provider: Treating Provider/Extender: Jamie [name] in Treatment: 37 History of Present Illness HPI Description: ADMISSION 06/30/2020 Jamie [name] is an 82 year old woman who lives in [place]. She is here with her niece for review of wounds on the left medial lower leg and ankle. These have apparently been present for over a year and she followed with Dr. Olegario [name] at the [facility] in [place] for quite a period  of time although it looks as though there was a initial consult wound from Jamie [name] on February 18 presumably there was therefore hiatus. At that point the wounds were described as being there for 3 months. She also tells me she was at the wound care center in [place] for a period of time with this. 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 She had venous studies that were negative for DVT ABIs on the right were 1.01 on the left 1.06. She has . had previous applications of puraply, compression which she does not tolerate very well. She has had several rounds of oral antibiotic therapy. She complains of unrelenting pain and she is seeing Dr. Reece Agar [name] of pain management apparently was on oxycodone but that did not help. She also had a skin biopsy done by Dr. Olegario [name] although we do not have that result. She does not appear to have an arterial issue. I am not completely clear what she has been putting on the wounds lately. 3/31; this patient has a particularly nasty set of wounds on the left medial ankle in the middle of what looks to be hemosiderin deposition secondary to chronic venous insufficiency. She has a lot of pain followed by pain management. Jamie [name] apparently did a biopsy of something on the left  leg last fall what I would like to get this result. She has a history of MRSA treatment. I do not believe she had reflux studies but she did have DVT rule out studies. Previous ABIs have not suggested arterial insufficiency Jamie [name], Jamie [name] ([id number]) [phone].[id number] Page 3 of 13 4/7; difficult wounds on the left medial ankle probably chronic venous insufficiency. With considerable effort on behalf of our case manager we were able to finally to speak to somebody at the hospital in [place] who indicated that no biopsy of this area have been done even though the patient describes this in some detail and is even able to point out where she thinks the biopsy was done. We have been using Sorbact. The PCR culture I did showed polymicrobial identification with Pseudomonas, staph aureus, Peptostreptococcus. All of this and low titers. Resistance genes identified were MRSA, staph virulence gene and tetracycline. We are going to send this to [facility] for a topical antibiotic which is something that we have had good success with recently in large venous ulcers with a lot of purulent drainage. There would not be an easy oral alternative here possibly line escalated and ciprofloxacin if we need to use systemic antibiotic 4/15; difficult area on the left medial ankle. Most likely chronic venous insufficiency. I think she will probably need venous reflux study I think she had DVT rule outs but not venous reflux studies. We have not yet obtained the topical antibiotics. She has home health changing the dressing we have been using Sorbact for adherent fibrinous debris on the surface. Very difficult to remove 4/22; patient presents for 1 week follow-up. She has been using sore back under compression wraps and these are changed 3 times a week with home health. She also had Keystone antibiotics sent to her house and brought them in today. She has no complaints or issues today. 5/2; patient is  here for follow-up. She has been using Sorbact under compression. Very painful wound. She has been using Keystone antibiotics. Not much improvement although the more medial part of the wound has cleaned up nicely and the larger part of the wound about 50% slough covered.  Part of the issue here is that she had stays at both [name] wound care center, [facility] wound care center and now [place]. Not sure if she has had venous reflux studies. As far as we are able to tell she did not have a biopsy. My notes state that she did not have venous reflux studies just DVT rule outs. 5/16; patient goes for venous reflux studies this afternoon. She says that wound was biopsied which sounds like punch biopsies by Dr. Lynden Ang we do not have these results. We are using Sorbact. Very difficult wound to debride 5/23; patient presents for 1 week follow-up. She reports tolerating the wraps well with sorbact underneath. She had ABIs and venous reflux studies done. She has no issues or complaints today. She denies acute signs of infection. 6/6; I have reviewed the patient's vascular studies. Wounds are on the left medial and posterior calf. She had significant reflux in the greater saphenous vein in the in the mid thigh, distal thigh knee and the small saphenous vein in the popliteal fossa. The vein diameters do not look too impressive though. She is going to see the vascular surgeon on Wednesday. She also had venous reflux in the right common femoral vein. She did not have any evidence of a DVT or SVT I am wondering whether there is an ablation procedure that would benefit her in the greater saphenous vein on the left. . She tells [place] that home health put the dressing on too tight and she took off 1 layer. The swelling in her left leg is a little worse as a result of this. Not much change in the wound measurements so the surface of the wound looks better Her arterial studies showed an ABI on the right of 1.14 with a triphasic  waveform and a great toe pressure of 0.89. On the left her ABIs were noncompressible at 1.34 but with triphasic waveforms and a TBI of 0.97. Her greater toe pressure was 122. There was no evidence of significant bilateral arterial disease 6/20 patient went to see Dr. Durwin Nora. He did not think she had significant arterial disease. In terms of her venous duplex on the right side there was no evidence of a DVT or SVT there was deep venous reflux involving the common femoral vein no superficial vein reflux on the left side there was no DVT or SVT there was no deep vein graft reflux there was some reflux in the greater saphenous vein from the mid thigh to the knee but the vein here was not dilated. He thought these were venous wounds he prescribed a compression pump but I am not sure who we ordered it from The patient has been approved for Apligraf. Still using silver collagen this week 7/5; Apligraf #1 7/19 Apligraf #2. Decent improvement in the condition of the wound bed. Epithelialization distal 8/2 Apligraf #3. No issues or complaints. Denies signs of infection. 8/17 Apligraf #4. No issues or concerns. Some complaints of pruritus and the rash. Our intake nurse brought up the fact that she had previously indicated possible cotton layer sensitivity we will therefore use kerlix in the bottom layer of the compression 8/30; the patient comes in with the area on her left medial leg just about healed. There is a superficial area more towards the tibia and a smaller open area distally everything else is epithelialized. I do not think she requires another Apligraf 9/13; left medial leg is healed. She has thick areas of chronic hypertrophied skin in this area  as well as likely lipodermatosclerosis. Her edema control is good Readmisstion: 05-22-2022 upon evaluation today patient presents for initial inspection here in our clinic concerning issues that she has been having with a wound of the right lateral lower  extremity. This is an area of a previous skin graft she tells me. With that being said she unfortunately had a scrape on this that occurred around 10 January. Since that time she has noted that this has just continued to get bigger and bigger in her niece who is present with her today actually states that 2 weeks ago when she saw that it was significantly smaller than what she sees currently. Obviously this is of utmost concern as we do not want this to continue to get larger when arrested and get moving in the right direction. Fortunately there does not appear to be any signs of systemic infection though locally I think we probably do have some infection present. I would obtain a culture to see what we have going on here and then we will subsequently see where things go going forward. Fortunately I think that she is in the right place at this point being at the wound center we can definitely do something to try to get this moving in the right direction. Patient does have a history of chronic venous insufficiency as well as coronary artery disease. In the past she has done well with compression wraps on the start with a 3 layer wrap I think she is probably can end up going to a 4-layer at some point but we will see how things do over the next week. She has been using wound cleanser along with she tells me a zinc and topical but again I am not sure exactly what that was. 05-29-2022 upon evaluation today patient appears to be doing well currently in regard to her wound. This actually is showing signs of improvement I am happy in that regard unfortunately her left leg has an area on the shin that has opened. This is the region where one of the areas at least we have previously taken care of. Nonetheless this is small hoping we get it under control before things worsen significantly. 06-05-2022 upon evaluation today patient appears to be doing well currently in regard to her wounds. The actually seem to be  fairly clean she is having still quite a bit of problem with pain at this point she would like to not have debridement today for all possible. With that being said I do believe that we are making some good progress I think the [facility] is doing a decent job here. 3/6; patient has had 3/6; patient with known severe chronic venous insufficiency. She apparently had a traumatic wound at home and has a reasonably substantial wound on the right lateral lower leg and a smaller one on the left anterior lower leg as well. We have been using Hydrofera Blue and Unna boots 3/13; patient presents for follow-up. We have been using Hydrofera Blue under Coflex to the legs bilaterally. She has no issues or complaints today. 06-26-2022 upon evaluation today patient appears to be doing well currently in regard to her wound. This is measuring a little bit larger however. Fortunately I do not see any signs of infection this is on the right side. Fortunately the left side is actually completely healed which is great news. 07-03-2022 patient's wound unfortunately is continuing to show signs of being worse. Her compression which was the Tubigrip that I thought should  be able to pull up actually slipped down and she was not able to pull that back up. With that being said that means that unfortunately her wound has continued to deteriorate since I last saw her. This is definitely not the direction that we are looking for. I discussed with her that I do believe we need to go ahead and see about getting her started on antibiotics and I subsequently would also like to go ahead and see about getting things moving forward with regard to a wound culture and making a change up her dressings as well but this can be changed more frequently than just once a week. 07-10-2022 upon evaluation today patient actually appears to be doing much better. I do believe that the antibiotics have been beneficial for her. Fortunately I do not see any  signs of active infection locally nor systemically which is great news. No fevers, chills, nausea, vomiting, or diarrhea. 07-17-2022 upon evaluation today patient appears to be doing well currently in regard to her wound which is actually showing signs of improvement this is slow but nonetheless prevalent that we are seeing good improvement here. Fortunately I do not see any signs of active infection locally nor systemically which is great news. Jamie [name], Jamie [name] ([id number]) [id number] Page 4 of 13 07-24-2022 upon evaluation today patient appears to be doing well currently in regard to her wound although the wrap that we had on has been slipping down and she really does not have anybody to help her with this at home health is not going to be coming out we could not get anybody that would actually be able to do the dressing changes. Fortunately I do not see any signs of active infection locally nor systemically which is great news. 08-07-22 poorly in regard to her leg compared to what it was previous. Fortunately there does not appear to be any signs of active infection locally nor systemically which is great news. No fevers, chills, nausea, vomiting, or diarrhea. With that being said it does appear that the patient may have some cellulitis in the leg which is not good. 08-14-2022 upon evaluation today patient appears to be doing somewhat better in regard to her wounds she still is having quite a bit of pain we are still not where we want to be as far as healing is concerned completely. Fortunately I do not see any evidence of active infection locally nor systemically which is great news and I am very pleased in that regard. 08-21-23 upon evaluation today patient appears to be doing poorly currently in regard to her wound. She has been tolerating the dressing changes without complication. Fortunately there does not appear to be any signs of active infection locally nor  systemically at this time. 08-28-2022 upon evaluation today patient appears to be doing poorly in regard to her wound this was not wrapped appropriately home health did not go up high enough on the wrap. This has caused some issues and I discussed that with the patient today. I do believe that she is going to require aggressive wrapping and treatment which she is getting the [facility] topical antibiotics today they can start using that at the next wrap on Friday. In the meantime they need to make sure that the rapid and appropriately we wrote very specific orders today. 09-04-2022 upon evaluation today patient appears to be doing well currently in regard to her wound which I think is making progress is still hurting her quite significantly. Fortunately I do  not see any signs of active infection locally or systemically which is great news. No fevers, chills, nausea, vomiting, or diarrhea. 09-11-2022 upon evaluation today patient's wound actually is showing signs of being a little bit smaller and looking a little bit better she still has a lot going on here however. Fortunately I do not see any evidence of active infection Worsening locally nor systemically which is great news. With that being said worsening still continue to use the topical Keystone antibiotics. 09-18-2022 upon evaluation today patient actually showing some signs of improvement. I am actually very pleased with where we stand compared to where we have been. I think that she is making good headway here. She is still having quite a bit of pain but it seems to be lessening compared to previous. 09-25-2022 upon evaluation patient's wound actually showing signs slowly but surely of improvement. Fortunately I do not see any signs of active infection at this time systemically and locally I feel like this is dramatically improved. She is doing well with the [facility] topical antibiotics. 10-02-2022 upon evaluation today patient appears to be doing  better in regard to her wound overall. I am very pleased with where things stand from a visual standpoint I do not see any signs of active infection and in general I think that we are moving in the right direction here. 10-09-2022 upon evaluation today patient appears to be doing well currently in regard to her wound. She has been tolerating the dressing changes without complication. Fortunately I do not see any signs of active infection at this time which is great news. 10-16-2022 upon evaluation today patient continues to have quite a bit of pain in regard to her right leg. We have been attempting to debride little by little as we could but again was pretty limited by the fact that she is having significant discomfort. We do not actually have any signs of active infection going on at this point that the [facility] topical antibiotics have been helping quite readily. I been attempting to do as much debridement as I can but if she were to have pain medication this would actually help and in the past when she did it was much more tolerable for her as far as taking the edge off. Right now she has been without as she is in transition from her previous pain management physician to someone new to manage this. 10-23-2022 upon evaluation patient appears to be doing excellent in regard to her wound. She has been tolerating the dressing changes without complication. Fortunately I do not see any evidence of active infection locally nor systemically which is great news. No fevers, chills, nausea, vomiting, or diarrhea. 7/24; patient with a wound secondary to chronic venous insufficiency on the right lateral lower leg. We have been using Keystone antibiotic Hydrofera Blue under kerlix Coban. She complains of a lot of pain after last week's debridement otherwise things appear to be improved per discussion with our intake nurse. 11-06-2022 upon evaluation today patient appears to be doing excellent at this point in regard  to her wound. She has been tolerating the dressing changes without complication and to be honest she is making excellent progress towards complete closure. I am actually very pleased with where we stand I think that she is doing excellent as far as the overall appearance of the wound is concerned as well. She is going require some debridement however. 11-20-2022 upon evaluation today patient appears to be doing well currently in regard to her  wound. She has been tolerating the dressing changes without complication. Fortunately there does not appear to be any signs of active infection locally or systemically which is great news and in general I do believe that we are moving in the right direction here. No fevers, chills, nausea, vomiting, or diarrhea. 11-27-2022 upon evaluation today patient appears to be doing a little better in regards to the overall appearance of her wound but unfortunately she is having increased pain. The collagen seems to be getting very dry and stuck to the wound bed which is causing her some issues here. Fortunately I do not see any signs of active infection locally nor systemically at this time. Fortunately I think that her wound is better but unfortunately her pain has not. For that reason I am going to avoid any debridement today since the patient is very upset about the pain and how bad this is hurting at this point. I think we can have to try something a little bit different I am thinking PolyMem may be a good option. 8/28; this patient has difficult venous wounds on the right lateral lower leg and ankle in the setting of chronic venous insufficiency. We have been using polymen under compression with kerlix Coban. 12-11-2022 upon evaluation today patient appears to be doing well currently in regard to her wound. She has been tolerating the dressing changes without complication and actually appears to be doing much better this week compared to when I saw her 2 weeks ago. The  wound is measuring significantly smaller. We are using PolyMem along with Kerlix and Coban. 12-18-2022 upon evaluation today patient appears to be doing well currently in regard to her wound. This is actually showing signs of improvement is measuring a little bit smaller and looking better. Fortunately I do not see any evidence of worsening overall and I believe that we will making good headway with the PolyMem and the [facility] topical antibiotics. 12-25-2022 upon evaluation today patient appears to be doing well currently in regard to her wound. She has been tolerating the dressing changes without complication. Fortunately I do not see any evidence of infection locally or systemically which is great news and in general I do believe that we will make an excellent headway towards complete closure also excellent news. 01-01-2023 upon evaluation patient's wound actually showing signs of improvement the wrap was actually put on properly this week and this is good news. Overall I am extremely happy with where things stand and how this appears today I do not see any signs of worsening overall and I believe that the patient is making excellent headway towards complete closure which is great news. 01/08/2023 upon evaluation today patient appears to be doing well currently in regard to her leg. She is actually draining much less unfortunately home health is just not doing very well getting the supplies necessary in order to continue to take care of the patient. They are now telling me they cannot get the PolyMem which is something that has been doing really well for her. That really does not make sense to me you came to get PolyMem for a hospice patient. Nonetheless either way I think we may need to just discontinue treatment with home health and just manage the patient here at the clinic. 01-15-2023 upon evaluation today patient appears to be doing well currently in regard to her wound. She has been tolerating the  dressing changes without Jamie [name], PANGILINAN ([id number]) [phone].pdf Page 5 of 13 complication. Fortunately I do  not see any signs of infection I think she is doing quite well and very pleased with where we stand today. No fevers, chills, nausea, vomiting, or diarrhea. 01-22-2023 upon evaluation today patient appears to be doing well currently in regard to her wound. She has been tolerating the dressing changes without complication and both wounds are measuring smaller today and look to be doing excellent. I am actually very pleased with what ever standing here and I think that she is making really good headway towards closure which is great news. 01-29-2023 upon evaluation today patient actually tells me she has been having some increased pain. Subsequently it took a little bit of drilling down but in the end I realized that we actually ended up putting a full Unna boot on her last week as opposed to just using a good anchor at the top and bottom. I think this is what wound up with her having increased discomfort because she tells me as she was doing well when she came in last time and by the time she left she tells me this was already getting significantly worse. And she hurt most of the week. With that being said I do believe that the patient is doing quite well at this point with regard to her wounds and the wrap did okay it was not a problem other than the increased pain I think we may benefit from a Urgo K2 light compression wrap as this will be much more comfortable I think compared to what we have been doing with just the Kerlix Coban anyway. 02-05-2023 upon evaluation today patient appears to be doing well currently in regard to her wounds. She has been tolerating the dressing changes without complication. Fortunately I do not see any signs of active infection looking systemically which is great news. No fevers, chills, nausea, vomiting, or diarrhea. 11/6; patient  I remember from a previous stay in this clinic With a left lower extremity wound.. She has a right lower extremity venous wound. She has been using Keystone, polymen under [employer]. Her wound now is to remaining open areas. There is no evidence of infection although she is tender in the area. I wonder whether there is stasis dermatitis here. She reminds me that she had some form of skin graft on this area during his stay at the [facility] wound care center presumably in around 2022. 11/13; difficult wound on the right lateral lower extremity. She has a history of venous wounds in this area and a history of a skin graft in this area. She can planes about a lot of pain but she has not been systemically unwell. No fever chills etc. 11/20; continue difficult wounds on the right lateral lower extremity. She has a history of venous wounds in this area and a history of skin graft in this area. She continues to complain of pain. She comes in today with completely nonviable surfaces. She has been using [employer] under and Urgo K2 light 12/3; significant improvement in wound dimensions still 2 open wounds which were initially part of the larger single wound. We have been using Keystone polymen under [employer]. The wounds are smaller today. The surface does not look as viable as I might like although she does not tolerate mechanical debridement at all 12/11; 2 open wounds 1 on the right anterior and 1 on the lateral lower leg. We have been using Keystone polymen under Urgo K2 lite's. The wounds are measuring smaller. Electronic Signature(s) Signed: 03/19/2023  5:18:28 PM By: Jamie Najjar MD Entered By: Jamie [name] on 03/19/2023 11:41:21 -------------------------------------------------------------------------------- Physical Exam Details Patient Name: Date of Service: Jamie [name], Jamie [name] 03/19/2023 11:00 A M Medical Record Number: [id number] Patient Account Number: [id number] Date of  Birth/Sex: Treating RN: 01/06/1941 (82 y.o. F) Primary Care Provider: Belva [name] Other Clinician: Referring Provider: Treating Provider/Extender: Jamie [name] in Treatment: 43 Notes Wound exam; 2 open wounds 1 anteriorly and 1 posterior laterally. Both of these have thick tissue over the wound margins. I used a #3 curette to remove this callus skin. No bleeding was caused. She tolerates any form of debridement very badly. I am not exactly sure of the reason. It could be dysesthesia caused by neuropathy I am just not sure. There is no evidence of infection Electronic Signature(s) Signed: 03/19/2023 5:18:28 PM By: Jamie Najjar MD Entered By: Jamie [name] on 03/19/2023 11:42:29 -------------------------------------------------------------------------------- Physician Orders Details Patient Name: Date of Service: Jamie [name], Jamie [name] 03/19/2023 11:00 A M Medical Record Number: [id number] Patient Account Number: [id number] Date of Birth/Sex: Treating RN: [birth year] (82 y.o. 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 - Dr. Leanord Hawking Wednesday Return A ppointment in 2 weeks. - Dr. Leanord Hawking Return A Return appointment in 3 weeks. - Dr. Leanord Hawking Return  appointment in 1 month. - Dr. Leanord Hawking Anesthetic Wound #5 Right,Lateral Lower Leg (In clinic) Topical Lidocaine 4% applied to wound bed Bathing/ Shower/ Hygiene May shower and wash wound with soap and water. - with dressing changes. Edema Control - Orders / Instructions Elevate legs to the level of the heart or above for 30 minutes daily and/or when sitting for 3-4 times a day throughout the day. Avoid standing for long periods of time. Exercise regularly - As tolerated Wound Treatment Wound #5 - Lower Leg Wound Laterality: Right, Lateral Cleanser: Soap and Water 1 x Per Week/30 Days Discharge Instructions: May shower and wash wound with dial antibacterial soap and water prior to dressing change. Peri-Wound Care: Sween Lotion (Moisturizing lotion) 1 x Per Week/30 Days Discharge Instructions: Apply moisturizing lotion as directed Topical: Skintegrity Hydrogel 4 (oz) 1 x Per Week/30 Days Discharge Instructions: Apply hydrogel with sorbact Topical: compounding topical antibiotics 1 x Per Week/30 Days Discharge Instructions: apply Keystone directly to wound bed under the hydrofera blue. Home Health start. MIX ACCORDING TO THE PHARMACY INSTRUCTIONS. Prim Dressing: Cutimed Sorbact Swab 1 x Per Week/30 Days ary Discharge Instructions: Apply to wound bed with Hydrogel Secondary Dressing: ABD Pad, 8x10 1 x Per Week/30 Days Discharge Instructions: Apply over primary dressing as directed. Secondary Dressing: Woven Gauze Sponge, Non-Sterile 4x4 in 1 x Per Week/30 Days Discharge Instructions: Apply over primary dressing as directed. Secondary Dressing: Zetuvit Plus 4x8 in 1 x Per Week/30 Days Discharge Instructions: Or double ABD pad Apply over primary dressing as directed. Compression Wrap: Kerlix Roll 4.5x3.1 (in/yd) 1 x Per Week/30 Days Discharge Instructions: Apply Kerlix and Coban compression as directed. Compression  Wrap: Coban Self-Adherent Wrap 4x5 (in/yd) 1 x Per Week/30 Days Discharge  Instructions: Apply over Kerlix as directed. Compression Wrap: unna boot FIRST LAYER **** SEE INSTRUCTIONS 1 x Per Week/30 Days Discharge Instructions: APPLY FIRST LAYER UNNA BOOT AT BASES OF TOES AND JUST BELOW THE KNEE TO HOLD COMPRESSION WRAP IN PLACE. Wound #7 - Lower Leg Wound Laterality: Right, Posterior Cleanser: Soap and Water 1 x Per Week/30 Days Discharge Instructions: May shower and wash wound with dial antibacterial soap and water prior to dressing change. Peri-Wound Care: Sween Lotion (Moisturizing lotion) 1 x Per Week/30 Days Discharge Instructions: Apply moisturizing lotion as directed Topical: Skintegrity Hydrogel 4 (oz) 1 x Per Week/30 Days Jamie [name], Jamie [name] ([id number]) [id number] Page 7 of 13 Discharge Instructions: Apply hydrogel with sorbact Topical: compounding topical antibiotics 1 x Per Week/30 Days Discharge Instructions: apply Keystone directly to wound bed under the hydrofera blue. Home Health start. MIX ACCORDING TO THE PHARMACY INSTRUCTIONS. Prim Dressing: Cutimed Sorbact Swab 1 x Per Week/30 Days ary Discharge Instructions: Apply to wound bed with Hydrogel Secondary Dressing: ABD Pad, 8x10 1 x Per Week/30 Days Discharge Instructions: Apply over primary dressing as directed. Secondary Dressing: Woven Gauze Sponge, Non-Sterile 4x4 in 1 x Per Week/30 Days Discharge Instructions: Apply over primary dressing as directed. Secondary Dressing: Zetuvit Plus 4x8 in 1 x Per Week/30 Days Discharge Instructions: Or double ABD pad Apply over primary dressing as directed. Compression Wrap: Kerlix Roll 4.5x3.1 (in/yd) 1 x Per Week/30 Days Discharge Instructions: Apply Kerlix and Coban compression as directed. Compression Wrap: Coban Self-Adherent Wrap 4x5 (in/yd) 1 x Per Week/30 Days Discharge Instructions: Apply over Kerlix as directed. Compression Wrap: unna boot FIRST LAYER **** SEE INSTRUCTIONS 1 x Per Week/30 Days Discharge Instructions:  APPLY FIRST LAYER UNNA BOOT AT BASES OF TOES AND JUST BELOW THE KNEE TO HOLD COMPRESSION WRAP IN PLACE. Electronic Signature(s) Signed: 03/19/2023 5:18:28 PM By: Jamie Najjar MD Signed: 03/19/2023 5:45:54 PM By: Jamie Stall RN, BSN Entered By: Jamie [name] on 03/19/2023 11:30:45 -------------------------------------------------------------------------------- Problem List Details Patient Name: Date of Service: Jamie [name], Jamie [name] 03/19/2023 11:00 A M Medical Record Number: [id number] Patient Account Number: [id number] Date of Birth/Sex: Treating RN: [birth year] (82 y.o. [name] Primary Care Provider: Belva [name] Other Clinician: Referring Provider: Treating Provider/Extender: Jamie [name] in Treatment: 43 Active Problems ICD-10 Encounter Code Description Active Date MDM Diagnosis I87.331 Chronic venous hypertension (idiopathic) with ulcer and inflammation of right 05/22/2022 No Yes lower extremity L97.812 Non-pressure chronic ulcer of other part of right lower leg with fat layer 05/22/2022 No Yes exposed I25.10 Atherosclerotic heart disease of native coronary artery without angina pectoris 05/22/2022 No Yes Inactive Problems Resolved Problems KAYELENE, ELIE ([id number]) [id number] Page 8 of 13 Electronic Signature(s) Signed: 03/19/2023 5:18:28 PM By: Jamie Najjar MD Entered By: Jamie [name] on 03/19/2023 11:37:31 -------------------------------------------------------------------------------- Progress Note Details Patient Name: Date of Service: Jamie [name], Jamie [name] 03/19/2023 11:00 A M Medical Record Number: [id number] Patient Account Number: [id number] Date of Birth/Sex: Treating RN: 1941/03/22 (82 y.o. F) Primary Care Provider: Belva [name] Other Clinician: Referring Provider: Treating Provider/Extender: Jamie [name] in Treatment: 43 Subjective History of Present Illness  (HPI) ADMISSION 06/30/2020 Mrs. Ax is an 82 year old woman who lives in [place]. She is here with her niece for review of wounds on the left medial lower leg and ankle. These have apparently been present for over a year and she followed with Dr. Olegario [name] at the [facility]  [facility] in [place] for quite a period of time although it looks as though there was a initial consult wound from Jamie [name] on February 18 presumably there was therefore hiatus. At that point the wounds were described as being there for 3 months. She also tells me she was at the wound care center in [place] for a period of time with this. 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 She had venous studies that were negative for DVT ABIs on the right were 1.01 on the left 1.06. She has . had previous applications of puraply, compression which she does not tolerate very well. She has had several rounds of oral antibiotic therapy. She complains of unrelenting pain and she is seeing Dr. Reece Agar [name] of pain management apparently was on oxycodone but that did not help. She also had a skin biopsy done by Dr. Olegario [name] although we do not have that result. She does not appear to have an arterial issue. I am not completely clear what she has been putting on the wounds lately. 3/31; this patient has a particularly nasty set of wounds on the left medial ankle in the middle of what looks to be hemosiderin deposition secondary to chronic venous insufficiency. She has a lot of pain followed by pain management. Jamie [name] apparently did a biopsy of something on the left leg last fall what I would like to get this result. She has a history of MRSA treatment. I do not believe she had reflux studies but she did have DVT rule out studies. Previous ABIs have not suggested arterial insufficiency 4/7; difficult wounds on the left medial ankle probably chronic venous insufficiency. With considerable effort on  behalf of our case manager we were able to finally to speak to somebody at the hospital in [place] who indicated that no biopsy of this area have been done even though the patient describes this in some detail and is even able to point out where she thinks the biopsy was done. We have been using Sorbact. The PCR culture I did showed polymicrobial identification with Pseudomonas, staph aureus, Peptostreptococcus. All of this and low titers. Resistance genes identified were MRSA, staph virulence gene and tetracycline. We are going to send this to [facility] for a topical antibiotic which is something that we have had good success with recently in large venous ulcers with a lot of purulent drainage. There would not be an easy oral alternative here possibly line escalated and ciprofloxacin if we need to use systemic antibiotic 4/15; difficult area on the left medial ankle. Most likely chronic venous insufficiency. I think she will probably need venous reflux study I think she had DVT rule outs but not venous reflux studies. We have not yet obtained the topical antibiotics. She has home health changing the dressing we have been using Sorbact for adherent fibrinous debris on the surface. Very difficult to remove 4/22; patient presents for 1 week follow-up. She has been using sore back under compression wraps and these are changed 3 times a week with home health. She also had Keystone antibiotics sent to her house and brought them in today. She has no complaints or issues today. 5/2; patient is here for follow-up. She has been using Sorbact under compression. Very painful wound. She has been using Keystone antibiotics. Not much improvement although the more medial part of the wound has cleaned up nicely and the larger part of the wound about 50% slough covered. Part of the  issue here is that she had stays at both [facility] wound care center, [facility] wound care center and now [place]. Not sure if she has had venous  reflux studies. As far as we are able to tell she did not have a biopsy. My notes state that she did not have venous reflux studies just DVT rule outs. 5/16; patient goes for venous reflux studies this afternoon. She says that wound was biopsied which sounds like punch biopsies by Dr. Lynden Ang we do not have these results. We are using Sorbact. Very difficult wound to debride 5/23; patient presents for 1 week follow-up. She reports tolerating the wraps well with sorbact underneath. She had ABIs and venous reflux studies done. She has no issues or complaints today. She denies acute signs of infection. 6/6; I have reviewed the patient's vascular studies. Wounds are on the left medial and posterior calf. She had significant reflux in the greater saphenous vein in the in the mid thigh, distal thigh knee and the small saphenous vein in the popliteal fossa. The vein diameters do not look too impressive though. She is going to see the vascular surgeon on Wednesday. She also had venous reflux in the right common femoral vein. She did not have any evidence of a DVT or SVT I am wondering whether there is an ablation procedure that would benefit her in the greater saphenous vein on the left. . She tells [place] that home health put the dressing on too tight and she took off 1 layer. The swelling in her left leg is a little worse as a result of this. Not much change in the wound measurements so the surface of the wound looks better Her arterial studies showed an ABI on the right of 1.14 with a triphasic waveform and a great toe pressure of 0.89. On the left her ABIs were noncompressible at 1.34 but with triphasic waveforms and a TBI of 0.97. Her greater toe pressure was 122. There was no evidence of significant bilateral arterial disease 6/20 patient went to see Dr. Durwin Nora. He did not think she had significant arterial disease. In terms of her venous duplex on the right side there was no evidence of a DVT or SVT there  was deep venous reflux involving the common femoral vein no superficial vein reflux on the left side there was no DVT or SVT there was no deep vein graft reflux there was some reflux in the greater saphenous vein from the mid thigh to the knee but the vein here was not dilated. He thought these were venous wounds he prescribed a compression pump but I am not sure who we ordered it from The patient has been approved for Apligraf. Still using silver collagen this week 7/5; Apligraf #1 7/19 Apligraf #2. Decent improvement in the condition of the wound bed. Epithelialization distal 8/2 Apligraf #3. No issues or complaints. Denies signs of infection. Jamie [name], Jamie [name] ([id number]) [id number] Page 9 of 13 8/17 Apligraf #4. No issues or concerns. Some complaints of pruritus and the rash. Our intake nurse brought up the fact that she had previously indicated possible cotton layer sensitivity we will therefore use kerlix in the bottom layer of the compression 8/30; the patient comes in with the area on her left medial leg just about healed. There is a superficial area more towards the tibia and a smaller open area distally everything else is epithelialized. I do not think she requires another Apligraf 9/13; left medial leg is healed. 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 Her edema control is good Readmisstion: 05-22-2022 upon evaluation today patient presents for initial inspection here in our clinic concerning issues that she has been having with a wound of the right lateral lower extremity. This is an area of a previous skin graft she tells me. With that being said she unfortunately had a scrape on this that occurred around 10 January. Since that time she has noted that this has just continued to get bigger and bigger in her niece who is present with her today actually states that 2 weeks ago when she saw that it  was significantly smaller than what she sees currently. Obviously this is of utmost concern as we do not want this to continue to get larger when arrested and get moving in the right direction. Fortunately there does not appear to be any signs of systemic infection though locally I think we probably do have some infection present. I would obtain a culture to see what we have going on here and then we will subsequently see where things go going forward. Fortunately I think that she is in the right place at this point being at the wound center we can definitely do something to try to get this moving in the right direction. Patient does have a history of chronic venous insufficiency as well as coronary artery disease. In the past she has done well with compression wraps on the start with a 3 layer wrap I think she is probably can end up going to a 4-layer at some point but we will see how things do over the next week. She has been using wound cleanser along with she tells me a zinc and topical but again I am not sure exactly what that was. 05-29-2022 upon evaluation today patient appears to be doing well currently in regard to her wound. This actually is showing signs of improvement I am happy in that regard unfortunately her left leg has an area on the shin that has opened. This is the region where one of the areas at least we have previously taken care of. Nonetheless this is small hoping we get it under control before things worsen significantly. 06-05-2022 upon evaluation today patient appears to be doing well currently in regard to her wounds. The actually seem to be fairly clean she is having still quite a bit of problem with pain at this point she would like to not have debridement today for all possible. With that being said I do believe that we are making some good progress I think the [facility] is doing a decent job here. 3/6; patient has had 3/6; patient with known severe chronic venous  insufficiency. She apparently had a traumatic wound at home and has a reasonably substantial wound on the right lateral lower leg and a smaller one on the left anterior lower leg as well. We have been using Hydrofera Blue and Unna boots 3/13; patient presents for follow-up. We have been using Hydrofera Blue under [employer] to the legs bilaterally. She has no issues or complaints today. 06-26-2022 upon evaluation today patient appears to be doing well currently in regard to her wound. This is measuring a little bit larger however. Fortunately I do not see any signs of infection this is on the right side. Fortunately the left side is actually completely healed which is great news. 07-03-2022 patient's wound unfortunately is continuing to show signs of being worse. Her compression which  was the Tubigrip that I thought should be able to pull up actually slipped down and she was not able to pull that back up. With that being said that means that unfortunately her wound has continued to deteriorate since I last saw her. This is definitely not the direction that we are looking for. I discussed with her that I do believe we need to go ahead and see about getting her started on antibiotics and I subsequently would also like to go ahead and see about getting things moving forward with regard to a wound culture and making a change up her dressings as well but this can be changed more frequently than just once a week. 07-10-2022 upon evaluation today patient actually appears to be doing much better. I do believe that the antibiotics have been beneficial for her. Fortunately I do not see any signs of active infection locally nor systemically which is great news. No fevers, chills, nausea, vomiting, or diarrhea. 07-17-2022 upon evaluation today patient appears to be doing well currently in regard to her wound which is actually showing signs of improvement this is slow but nonetheless prevalent that we are seeing good  improvement here. Fortunately I do not see any signs of active infection locally nor systemically which is great news. 07-24-2022 upon evaluation today patient appears to be doing well currently in regard to her wound although the wrap that we had on has been slipping down and she really does not have anybody to help her with this at home health is not going to be coming out we could not get anybody that would actually be able to do the dressing changes. Fortunately I do not see any signs of active infection locally nor systemically which is great news. 08-07-22 poorly in regard to her leg compared to what it was previous. Fortunately there does not appear to be any signs of active infection locally nor systemically which is great news. No fevers, chills, nausea, vomiting, or diarrhea. With that being said it does appear that the patient may have some cellulitis in the leg which is not good. 08-14-2022 upon evaluation today patient appears to be doing somewhat better in regard to her wounds she still is having quite a bit of pain we are still not where we want to be as far as healing is concerned completely. Fortunately I do not see any evidence of active infection locally nor systemically which is great news and I am very pleased in that regard. 08-21-23 upon evaluation today patient appears to be doing poorly currently in regard to her wound. She has been tolerating the dressing changes without complication. Fortunately there does not appear to be any signs of active infection locally nor systemically at this time. 08-28-2022 upon evaluation today patient appears to be doing poorly in regard to her wound this was not wrapped appropriately home health did not go up high enough on the wrap. This has caused some issues and I discussed that with the patient today. I do believe that she is going to require aggressive wrapping and treatment which she is getting the [facility] topical antibiotics today they can  start using that at the next wrap on Friday. In the meantime they need to make sure that the rapid and appropriately we wrote very specific orders today. 09-04-2022 upon evaluation today patient appears to be doing well currently in regard to her wound which I think is making progress is still hurting her quite significantly. Fortunately I do not see  any signs of active infection locally or systemically which is great news. No fevers, chills, nausea, vomiting, or diarrhea. 09-11-2022 upon evaluation today patient's wound actually is showing signs of being a little bit smaller and looking a little bit better she still has a lot going on here however. Fortunately I do not see any evidence of active infection Worsening locally nor systemically which is great news. With that being said worsening still continue to use the topical Keystone antibiotics. 09-18-2022 upon evaluation today patient actually showing some signs of improvement. I am actually very pleased with where we stand compared to where we have been. I think that she is making good headway here. She is still having quite a bit of pain but it seems to be lessening compared to previous. 09-25-2022 upon evaluation patient's wound actually showing signs slowly but surely of improvement. Fortunately I do not see any signs of active infection at this time systemically and locally I feel like this is dramatically improved. She is doing well with the [facility] topical antibiotics. 10-02-2022 upon evaluation today patient appears to be doing better in regard to her wound overall. I am very pleased with where things stand from a visual standpoint I do not see any signs of active infection and in general I think that we are moving in the right direction here. 10-09-2022 upon evaluation today patient appears to be doing well currently in regard to her wound. She has been tolerating the dressing changes without complication. Fortunately I do not see any signs of  active infection at this time which is great news. 10-16-2022 upon evaluation today patient continues to have quite a bit of pain in regard to her right leg. We have been attempting to debride little by little as we could but again was pretty limited by the fact that she is having significant discomfort. We do not actually have any signs of active infection going on at this Jamie [name], Jamie [name] ([id number]) [phone].pdf Page 10 of [address] that the [facility] topical antibiotics have been helping quite readily. I been attempting to do as much debridement as I can but if she were to have pain medication this would actually help and in the past when she did it was much more tolerable for her as far as taking the edge off. Right now she has been without as she is in transition from her previous pain management physician to someone new to manage this. 10-23-2022 upon evaluation patient appears to be doing excellent in regard to her wound. She has been tolerating the dressing changes without complication. Fortunately I do not see any evidence of active infection locally nor systemically which is great news. No fevers, chills, nausea, vomiting, or diarrhea. 7/24; patient with a wound secondary to chronic venous insufficiency on the right lateral lower leg. We have been using Keystone antibiotic Hydrofera Blue under kerlix Coban. She complains of a lot of pain after last week's debridement otherwise things appear to be improved per discussion with our intake nurse. 11-06-2022 upon evaluation today patient appears to be doing excellent at this point in regard to her wound. She has been tolerating the dressing changes without complication and to be honest she is making excellent progress towards complete closure. I am actually very pleased with where we stand I think that she is doing excellent as far as the overall appearance of the wound is concerned as well. She is going require some  debridement however. 11-20-2022 upon evaluation today patient appears to be  doing well currently in regard to her wound. She has been tolerating the dressing changes without complication. Fortunately there does not appear to be any signs of active infection locally or systemically which is great news and in general I do believe that we are moving in the right direction here. No fevers, chills, nausea, vomiting, or diarrhea. 11-27-2022 upon evaluation today patient appears to be doing a little better in regards to the overall appearance of her wound but unfortunately she is having increased pain. The collagen seems to be getting very dry and stuck to the wound bed which is causing her some issues here. Fortunately I do not see any signs of active infection locally nor systemically at this time. Fortunately I think that her wound is better but unfortunately her pain has not. For that reason I am going to avoid any debridement today since the patient is very upset about the pain and how bad this is hurting at this point. I think we can have to try something a little bit different I am thinking PolyMem may be a good option. 8/28; this patient has difficult venous wounds on the right lateral lower leg and ankle in the setting of chronic venous insufficiency. We have been using polymen under compression with kerlix Coban. 12-11-2022 upon evaluation today patient appears to be doing well currently in regard to her wound. She has been tolerating the dressing changes without complication and actually appears to be doing much better this week compared to when I saw her 2 weeks ago. The wound is measuring significantly smaller. We are using PolyMem along with Kerlix and Coban. 12-18-2022 upon evaluation today patient appears to be doing well currently in regard to her wound. This is actually showing signs of improvement is measuring a little bit smaller and looking better. Fortunately I do not see any evidence of  worsening overall and I believe that we will making good headway with the PolyMem and the [facility] topical antibiotics. 12-25-2022 upon evaluation today patient appears to be doing well currently in regard to her wound. She has been tolerating the dressing changes without complication. Fortunately I do not see any evidence of infection locally or systemically which is great news and in general I do believe that we will make an excellent headway towards complete closure also excellent news. 01-01-2023 upon evaluation patient's wound actually showing signs of improvement the wrap was actually put on properly this week and this is good news. Overall I am extremely happy with where things stand and how this appears today I do not see any signs of worsening overall and I believe that the patient is making excellent headway towards complete closure which is great news. 01/08/2023 upon evaluation today patient appears to be doing well currently in regard to her leg. She is actually draining much less unfortunately home health is just not doing very well getting the supplies necessary in order to continue to take care of the patient. They are now telling me they cannot get the PolyMem which is something that has been doing really well for her. That really does not make sense to me you came to get PolyMem for a hospice patient. Nonetheless either way I think we may need to just discontinue treatment with home health and just manage the patient here at the clinic. 01-15-2023 upon evaluation today patient appears to be doing well currently in regard to her wound. She has been tolerating the dressing changes without complication. Fortunately I do not see any  signs of infection I think she is doing quite well and very pleased with where we stand today. No fevers, chills, nausea, vomiting, or diarrhea. 01-22-2023 upon evaluation today patient appears to be doing well currently in regard to her wound. She has been  tolerating the dressing changes without complication and both wounds are measuring smaller today and look to be doing excellent. I am actually very pleased with what ever standing here and I think that she is making really good headway towards closure which is great news. 01-29-2023 upon evaluation today patient actually tells me she has been having some increased pain. Subsequently it took a little bit of drilling down but in the end I realized that we actually ended up putting a full Unna boot on her last week as opposed to just using a good anchor at the top and bottom. I think this is what wound up with her having increased discomfort because she tells me as she was doing well when she came in last time and by the time she left she tells me this was already getting significantly worse. And she hurt most of the week. With that being said I do believe that the patient is doing quite well at this point with regard to her wounds and the wrap did okay it was not a problem other than the increased pain I think we may benefit from a Urgo K2 light compression wrap as this will be much more comfortable I think compared to what we have been doing with just the Kerlix Coban anyway. 02-05-2023 upon evaluation today patient appears to be doing well currently in regard to her wounds. She has been tolerating the dressing changes without complication. Fortunately I do not see any signs of active infection looking systemically which is great news. No fevers, chills, nausea, vomiting, or diarrhea. 11/6; patient I remember from a previous stay in this clinic With a left lower extremity wound.. She has a right lower extremity venous wound. She has been using Keystone, polymen under [employer]. Her wound now is to remaining open areas. There is no evidence of infection although she is tender in the area. I wonder whether there is stasis dermatitis here. She reminds me that she had some form of skin graft on this area  during his stay at the [facility] wound care center presumably in around 2022. 11/13; difficult wound on the right lateral lower extremity. She has a history of venous wounds in this area and a history of a skin graft in this area. She can planes about a lot of pain but she has not been systemically unwell. No fever chills etc. 11/20; continue difficult wounds on the right lateral lower extremity. She has a history of venous wounds in this area and a history of skin graft in this area. She continues to complain of pain. She comes in today with completely nonviable surfaces. She has been using [employer] under and Urgo K2 light 12/3; significant improvement in wound dimensions still 2 open wounds which were initially part of the larger single wound. We have been using Keystone polymen under [employer]. The wounds are smaller today. The surface does not look as viable as I might like although she does not tolerate mechanical debridement at all 12/11; 2 open wounds 1 on the right anterior and 1 on the lateral lower leg. We have been using Keystone polymen under Urgo K2 lite's. The wounds are measuring smaller. Jamie [name], Jamie [name] ([id number]) [id number] Page  11 of 13 Objective Constitutional Vitals Time Taken: 10:59 AM, Height: 62 in, Weight: 171 lbs, BMI: 31.3, Temperature: 98 F, Pulse: 69 bpm, Respiratory Rate: 18 breaths/min, Blood Pressure: 146/87 mmHg. Integumentary (Hair, Skin) Wound #5 status is Open. Original cause of wound was Trauma. The date acquired was: 04/17/2022. The wound has been in treatment 43 weeks. The wound is located on the Right,Lateral Lower Leg. The wound measures 1.2cm length x 1.2cm width x 0.1cm depth; 1.131cm^2 area and 0.113cm^3 volume. There is Fat Layer (Subcutaneous Tissue) exposed. There is no tunneling or undermining noted. There is a medium amount of serosanguineous drainage noted. The wound margin is thickened. There is medium  (34-66%) red, pink granulation within the wound bed. There is a medium (34-66%) amount of necrotic tissue within the wound bed including Adherent Slough. The periwound skin appearance exhibited: Scarring, Hemosiderin Staining. The periwound skin appearance did not exhibit: Callus, Crepitus, Excoriation, Induration, Rash, Dry/Scaly, Maceration, Atrophie Blanche, Cyanosis, Ecchymosis, Mottled, Pallor, Rubor, Erythema. Periwound temperature was noted as No Abnormality. The periwound has tenderness on palpation. Wound #7 status is Open. Original cause of wound was Gradually Appeared. The date acquired was: 01/01/2023. The wound has been in treatment 11 weeks. The wound is located on the Right,Posterior Lower Leg. The wound measures 1cm length x 0.3cm width x 0.1cm depth; 0.236cm^2 area and 0.024cm^3 volume. There is Fat Layer (Subcutaneous Tissue) exposed. There is no tunneling or undermining noted. There is a medium amount of serosanguineous drainage noted. The wound margin is thickened. There is large (67-100%) red, pink granulation within the wound bed. There is a small (1-33%) amount of necrotic tissue within the wound bed including Adherent Slough. The periwound skin appearance exhibited: Dry/Scaly. The periwound skin appearance did not exhibit: Callus, Crepitus, Excoriation, Induration, Rash, Scarring, Maceration, Atrophie Blanche, Cyanosis, Ecchymosis, Hemosiderin Staining, Mottled, Pallor, Rubor, Erythema. Periwound temperature was noted as No Abnormality. The periwound has tenderness on palpation. 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 There was a Selective/Open Wound Skin/Epidermis Debridement with a total area of 1.13 sq cm performed by Jamie [name] Caul., MD. With the following instrument(s): Curette to remove Viable and Non-Viable tissue/material. Material removed includes Eschar, Callus, Skin: Dermis, and Skin: Epidermis. A time out was conducted at 11:20, prior to the start of the procedure. A Minimum amount of bleeding was controlled with Pressure. The procedure was tolerated well with a pain level of 0 throughout and a pain level of 0 following the procedure. Post Debridement Measurements: 1.2cm length x 1.2cm width x 0.1cm depth; 0.113cm^3 volume. Character of Wound/Ulcer Post Debridement is improved. Severity of Tissue Post Debridement is: Fat layer exposed. Post procedure Diagnosis Wound #5: Same as Pre-Procedure Wound #7 Pre-procedure diagnosis of Wound #7 is a Venous Leg Ulcer located on the Right,Posterior Lower Leg .Severity of Tissue Pre Debridement is: Fat layer exposed. There was a Selective/Open Wound Skin/Epidermis Debridement with a total area of 0.24 sq cm performed by Jamie [name] Caul., MD. With the following instrument(s): Curette to remove Viable and Non-Viable tissue/material. Material removed includes Eschar, Callus, Skin: Dermis, and Skin: Epidermis. A time out was conducted at 11:20, prior  to the start of the procedure. A Minimum amount of bleeding was controlled with Pressure. The procedure was tolerated well with a pain level of 0 throughout and a pain level of 0 following the procedure. Post Debridement Measurements: 1cm length x 0.3cm width x 0.1cm depth; 0.024cm^3 volume. Character of Wound/Ulcer Post Debridement is improved. Severity of Tissue Post Debridement is: Fat layer exposed. Post procedure Diagnosis Wound #7: Same as Pre-Procedure Plan Follow-up Appointments: Return Appointment in 1 week. - Dr. Leanord Hawking Wednesday Return  Appointment in 2 weeks. - Dr. Leanord Hawking Return appointment in 3 weeks. - Dr. Leanord Hawking Return appointment in 1 month. - Dr. Leanord Hawking Anesthetic: Wound #5 Right,Lateral Lower Leg: (In clinic) Topical Lidocaine 4% applied to wound bed Bathing/ Shower/ Hygiene: May shower and wash wound with soap and water. - with dressing changes. Edema Control - Orders / Instructions: Elevate legs to the level of the heart or above for 30 minutes daily and/or when sitting for 3-4 times a day throughout the day. Avoid standing for long periods of time. Exercise regularly - As tolerated WOUND #5: - Lower Leg Wound Laterality: Right, Lateral Cleanser: Soap and Water 1 x Per Week/30 Days Jamie [name], Jamie [name] ([id number]) [id number] Page 12 of 13 Discharge Instructions: May shower and wash wound with dial antibacterial soap and water prior to dressing change. Peri-Wound Care: Sween Lotion (Moisturizing lotion) 1 x Per Week/30 Days Discharge Instructions: Apply moisturizing lotion as directed Topical: Skintegrity Hydrogel 4 (oz) 1 x Per Week/30 Days Discharge Instructions: Apply hydrogel with sorbact Topical: compounding topical antibiotics 1 x Per Week/30 Days Discharge Instructions: apply Keystone directly to wound bed under the hydrofera blue. Home Health start. MIX ACCORDING TO THE PHARMACY INSTRUCTIONS. Prim Dressing: Cutimed Sorbact Swab 1 x Per Week/30 Days ary Discharge Instructions: Apply to wound bed with Hydrogel Secondary Dressing: ABD Pad, 8x10 1 x Per Week/30 Days Discharge Instructions: Apply over primary dressing as directed. Secondary Dressing: Woven Gauze Sponge, Non-Sterile 4x4 in 1 x Per Week/30 Days Discharge Instructions: Apply over primary dressing as directed. Secondary Dressing: Zetuvit Plus 4x8 in 1 x Per Week/30 Days Discharge Instructions: Or double ABD pad Apply over primary dressing as directed. Com pression Wrap: Kerlix Roll 4.5x3.1 (in/yd) 1 x Per Week/30  Days Discharge Instructions: Apply Kerlix and Coban compression as directed. Com pression Wrap: Coban Self-Adherent Wrap 4x5 (in/yd) 1 x Per Week/30 Days Discharge Instructions: Apply over Kerlix as directed. Com pression Wrap: unna boot FIRST LAYER **** SEE INSTRUCTIONS 1 x Per Week/30 Days Discharge Instructions: APPLY FIRST LAYER UNNA BOOT AT BASES OF TOES AND JUST BELOW THE KNEE TO HOLD COMPRESSION WRAP IN PLACE. WOUND #7: - Lower Leg Wound Laterality: Right, Posterior Cleanser: Soap and Water 1 x Per Week/30 Days Discharge Instructions: May shower and wash wound with dial antibacterial soap and water prior to dressing change. Peri-Wound Care: Sween Lotion (Moisturizing lotion) 1 x Per Week/30 Days Discharge Instructions: Apply moisturizing lotion as directed Topical: Skintegrity Hydrogel 4 (oz) 1 x Per Week/30 Days Discharge Instructions: Apply hydrogel with sorbact Topical: compounding topical antibiotics 1 x Per Week/30 Days Discharge Instructions: apply Keystone directly to wound bed under the hydrofera blue. Home Health start. MIX ACCORDING TO THE PHARMACY INSTRUCTIONS. Prim Dressing: Cutimed Sorbact Swab 1 x Per Week/30 Days ary Discharge Instructions: Apply to wound bed with Hydrogel Secondary Dressing: ABD Pad, 8x10 1 x Per Week/30 Days Discharge Instructions: Apply over primary dressing as directed. Secondary Dressing: Woven Gauze Sponge, Non-Sterile 4x4 in 1 x Per Week/30  Days Discharge Instructions: Apply over primary dressing as directed. Secondary Dressing: Zetuvit Plus 4x8 in 1 x Per Week/30 Days Discharge Instructions: Or double ABD pad Apply over primary dressing as directed. Com pression Wrap: Kerlix Roll 4.5x3.1 (in/yd) 1 x Per Week/30 Days Discharge Instructions: Apply Kerlix and Coban compression as directed. Com pression Wrap: Coban Self-Adherent Wrap 4x5 (in/yd) 1 x Per Week/30 Days Discharge Instructions: Apply over Kerlix as directed. Com pression Wrap: unna  boot FIRST LAYER **** SEE INSTRUCTIONS 1 x Per Week/30 Days Discharge Instructions: APPLY FIRST LAYER UNNA BOOT AT BASES OF TOES AND JUST BELOW THE KNEE TO HOLD COMPRESSION WRAP IN PLACE. 1. We continued with Sorbac as a primary dressing ABDs, kerlix Coban 2. We are making progress with the more anterior wound for sure. The posterior lateral I think we have also made more incremental gains. Electronic Signature(s) Signed: 03/19/2023 5:18:28 PM By: Jamie Najjar MD Entered By: Jamie [name] on 03/19/2023 11:43:29 -------------------------------------------------------------------------------- SuperBill Details Patient Name: Date of Service: Jamie [name], Christean Grief. 03/19/2023 Medical Record Number: [id number] Patient Account Number: [id number] Date of Birth/Sex: Treating RN: 08-21-1940 (82 y.o. [name] Primary Care Provider: Belva [name] Other Clinician: Referring Provider: Treating Provider/Extender: Jamie [name] in Treatment: 43 Diagnosis Coding ICD-10 Codes Code Description [id number] Chronic venous hypertension (idiopathic) with ulcer and inflammation of right lower extremity 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d KORAYMA, SCHUCKMAN ([id number]) [id number] Page 13 of 13 I25.10 Atherosclerotic heart disease of native coronary artery without angina pectoris Facility Procedures : CPT4 Code: 96789381 Description: [phone] - DEBRIDE WOUND 1ST 20 SQ CM OR < ICD-10 Diagnosis Description L97.812 Non-pressure chronic ulcer of other part of right lower leg with fat layer expos I87.331 Chronic venous hypertension (idiopathic) with ulcer and inflammation of  right lo Modifier: ed wer extremity Quantity: 1 Physician Procedures : CPT4 Code Description Modifier [id number] - WC PHYS DEBR WO ANESTH 20 SQ CM ICD-10 Diagnosis Description L97.812 Non-pressure chronic ulcer of other part of  right lower leg with fat layer exposed I87.331 Chronic venous hypertension (idiopathic) with  ulcer and inflammation of right lower extremity Quantity: 1 Electronic Signature(s) Signed: 03/19/2023 5:18:28 PM By: Jamie Najjar MD Entered By: Jamie [name] on 03/19/2023 11:43:38

## 2023-03-20 NOTE — Progress Notes (Signed)
TIAJA, RAVITZ ([id number]) [id number] Page 1 of 11 Visit Report for 03/19/2023 Arrival Information Details Patient Name: Date of Service: Jamie [name], [name]. 03/19/2023 11:00 A M Medical Record Number: [id number] Patient Account Number: [id number] Date of Birth/Sex: Treating RN: [birth year] (82 y.o. F) Primary Care Day Greb: Belva Agee Other Clinician: Referring Raelene Trew: Treating Jaiya Mooradian/Extender: Elby Beck in Treatment: 43 Visit Information History Since Last Visit Added or deleted any medications: No Patient Arrived: Wheel Chair Any new allergies or adverse reactions: No Arrival Time: 10:59 Had a fall or experienced change in No Accompanied By: niece activities of daily living that may affect Transfer Assistance: None risk of falls: Patient Identification Verified: Yes Signs or symptoms of abuse/neglect since last visito No Secondary Verification Process Completed: Yes Hospitalized since last visit: No Patient Requires Transmission-Based Precautions: No Implantable device outside of the clinic excluding No Patient Has Alerts: No cellular tissue based products placed in the center since last visit: Pain Present Now: Yes Electronic Signature(s) Signed: 03/19/2023 1:06:29 PM By: Dayton Scrape Entered By: Dayton Scrape on 03/19/2023 10:59:47 -------------------------------------------------------------------------------- Encounter Discharge Information Details Patient Name: Date of Service: Jamie [name], [name] 03/19/2023 11:00 A M Medical Record Number: [id number] Patient Account Number: [id number] Date of Birth/Sex: Treating RN: July 20, 1940 (82 y.o. [name] Primary Care Tashica Provencio: Belva Agee Other Clinician: Referring Janin Kozlowski: Treating Samanta Gal/Extender: Elby Beck in Treatment: 42 Encounter Discharge Information Items Post Procedure Vitals Discharge Condition:  Stable Temperature (F): 98 Ambulatory Status: Wheelchair Pulse (bpm): 69 Discharge Destination: Home Respiratory Rate (breaths/min): 20 Transportation: Private Auto Blood Pressure (mmHg): 146/87 Accompanied By: niece Schedule Follow-up Appointment: Yes Clinical Summary of Care: Electronic Signature(s) Signed: 03/19/2023 5:45:54 PM By: Shawn Stall RN, BSN Entered By: Shawn Stall on 03/19/2023 11:33:30 Jamie [name] ([id number]) [id number] Page 2 of 11 -------------------------------------------------------------------------------- Lower Extremity Assessment Details Patient Name: Date of Service: Jamie [name], [name]. 03/19/2023 11:00 A M Medical Record Number: [id number] Patient Account Number: [id number] Date of Birth/Sex: Treating RN: Feb 25, 1941 (82 y.o. [name] Primary Care Ennio Houp: Belva Agee Other Clinician: Referring Jachob Mcclean: Treating Kaelynne Christley/Extender: Elby Beck in Treatment: 43 Edema Assessment Assessed: [name]: No] Franne Forts: Yes] Edema: [Left: [name]] [Right: o] Calf Left: Right: Point of Measurement: From Medial Instep 34 cm Ankle Left: Right: Point of Measurement: From Medial Instep 24 cm Vascular Assessment Pulses: Dorsalis Pedis Palpable: [Right:Yes] Extremity colors, hair growth, and conditions: Extremity Color: [Right:Normal] Hair Growth on Extremity: [Right:No] Temperature of Extremity: [Right:Warm] Capillary Refill: [Right:< 3 seconds] Dependent Rubor: [Right:No] Blanched when Elevated: [Right:No No] Toe Nail Assessment Left: Right: Thick: Yes Discolored: Yes Deformed: Yes Improper Length and Hygiene: No Electronic Signature(s) Signed: 03/19/2023 5:45:54 PM By: Shawn Stall RN, BSN Entered By: Shawn Stall on 03/19/2023 11:22:08 -------------------------------------------------------------------------------- Multi Wound Chart Details Patient Name: Date of Service: Jamie [name], [name] 03/19/2023 11:00 A M Medical Record Number: [id number] Patient Account Number: [id number] Date of Birth/Sex: Treating RN: August 01, 1940 (82 y.o. 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 [name] Primary Etiology: Sleep Apnea, Hypertension, Peripheral Sleep Apnea, Hypertension, Peripheral  [name] Comorbid History: Venous Disease, Osteoarthritis, Venous Disease, Osteoarthritis, Neuropathy Neuropathy 04/17/2022 01/01/2023 [name] Date Acquired: 58 11 [name] Weeks of Treatment: Open Open [name] Wound Status: No No [name] Wound Recurrence: Yes No [name] Clustered Wound: 1.2x1.2x0.1 1x0.3x0.1 [name] Measurements L x W x D (cm) 1.131 0.236 [name] A (cm) : rea 0.113 0.024 [name] Volume (cm) : 95.70% 98.00% [name] % Reduction in A rea: 98.60% 99.00% [name] % Reduction in Volume: Full Thickness Without Exposed Full Thickness Without Exposed [name] Classification: Support Structures Support Structures Medium Medium [name] Exudate A mount: Serosanguineous Serosanguineous [name] Exudate Type: red, brown red, brown [name] Exudate Color: Thickened Thickened [name] Wound Margin: Medium (34-66%) Large (67-100%) [name] Granulation A mount: Red, Pink Red, Pink [name] Granulation Quality: Medium (34-66%) Small (1-33%) [name] Necrotic A mount: Fat Layer (Subcutaneous Tissue): Yes Fat Layer (Subcutaneous Tissue): Yes [name] Exposed Structures: Fascia: No Fascia: No Tendon: No Tendon: No Muscle:  No Muscle: No Joint: No Joint: No Bone: No Bone: No Medium (34-66%) Large (67-100%) [name] Epithelialization: Debridement - Selective/Open Wound Debridement - Selective/Open Wound [name] Debridement: Pre-procedure Verification/Time Out 11:20 11:20 [name] Taken: Necrotic/Eschar, Callus Necrotic/Eschar, Callus [name] Tissue Debrided: Skin/Epidermis Skin/Epidermis [name] Level: 1.13 0.24 [name] Debridement A (sq cm): rea Curette Curette [name] Instrument: Minimum Minimum [name] Bleeding: Pressure Pressure [name] Hemostasis A chieved: 0 0 [name] Procedural Pain: 0 0 [name] Post Procedural Pain: Procedure was tolerated well Procedure was tolerated well [name] Debridement Treatment Response: 1.2x1.2x0.1 1x0.3x0.1 [name] Post Debridement Measurements L x W x D (cm) 0.113 0.024 [name] Post Debridement Volume: (cm) Scarring: Yes Excoriation: No [name] Periwound Skin Texture: Excoriation: No Induration: No Induration: No Callus: No Callus: No Crepitus: No Crepitus: No Rash: No Rash: No Scarring: No Maceration: No Dry/Scaly: Yes [name] Periwound Skin Moisture: Dry/Scaly: No Maceration: No Hemosiderin Staining: Yes Atrophie Blanche: No [name] Periwound Skin Color: Atrophie Blanche: No Cyanosis: No Cyanosis: No Ecchymosis: No Ecchymosis: No Erythema: No Erythema: No Hemosiderin Staining: No Mottled: No Mottled: No Pallor: No Pallor: No Rubor: No Rubor: No No Abnormality No Abnormality [name] Temperature: Yes Yes [name] Tenderness on Palpation: Debridement Debridement [name] Procedures Performed: Treatment Notes Jamie [name], Jamie [name] ([id number]) [phone].pdf Page 4 of 11 Wound #5 (Lower Leg) Wound Laterality: Right, Lateral Cleanser Soap and Water Discharge Instruction: May shower and wash wound with dial antibacterial soap and water prior to dressing change. Peri-Wound Care Sween Lotion (Moisturizing lotion) Discharge Instruction: Apply moisturizing lotion as  directed Topical Skintegrity Hydrogel 4 (oz) Discharge Instruction: Apply hydrogel with sorbact compounding topical antibiotics Discharge Instruction: apply Keystone directly to wound bed under the hydrofera blue. Home Health start. MIX ACCORDING TO THE PHARMACY INSTRUCTIONS. Primary Dressing Cutimed Sorbact Swab Discharge Instruction: Apply to wound bed with Hydrogel Secondary Dressing ABD Pad, 8x10 Discharge Instruction: Apply over primary dressing as directed. Woven Gauze Sponge, Non-Sterile 4x4 in Discharge Instruction: Apply over primary dressing as directed. Zetuvit Plus 4x8 in Discharge Instruction: Or double ABD pad Apply over primary dressing as directed. Secured With Compression Wrap Kerlix Roll 4.5x3.1 (in/yd) Discharge Instruction: Apply Kerlix and Coban compression as directed. Coban Self-Adherent Wrap 4x5 (in/yd) Discharge Instruction: Apply over Kerlix as directed. unna boot FIRST LAYER **** SEE INSTRUCTIONS Discharge Instruction: APPLY FIRST LAYER UNNA BOOT AT BASES OF TOES AND JUST BELOW THE KNEE TO HOLD COMPRESSION WRAP IN PLACE. Compression Stockings Add-Ons Wound #7 (Lower Leg) Wound Laterality: Right, Posterior Cleanser Soap and Water Discharge Instruction: May shower and wash wound with dial antibacterial soap and water prior to dressing change. Peri-Wound  Care Sween Lotion (Moisturizing lotion) Discharge Instruction: Apply moisturizing lotion as directed Topical Skintegrity Hydrogel 4 (oz) Discharge Instruction: Apply hydrogel with sorbact compounding topical antibiotics Discharge Instruction: apply Keystone directly to wound bed under the hydrofera blue. Home Health start. MIX ACCORDING TO THE PHARMACY INSTRUCTIONS. Primary Dressing Cutimed Sorbact Swab Discharge Instruction: Apply to wound bed with Hydrogel Secondary Dressing ABD Pad, 8x10 Discharge Instruction: Apply over primary dressing as directed. Woven Gauze Sponge, Non-Sterile 4x4  in Discharge Instruction: Apply over primary dressing as directed. Zetuvit Plus 4x8 in Discharge Instruction: Or double ABD pad Apply over primary dressing as directed. Jamie [name], Jamie [name] ([id number]) [id number] Page 5 of 11 Secured With Compression Wrap Kerlix Roll 4.5x3.1 (in/yd) Discharge Instruction: Apply Kerlix and Coban compression as directed. Coban Self-Adherent Wrap 4x5 (in/yd) Discharge Instruction: Apply over Kerlix as directed. unna boot FIRST LAYER **** SEE INSTRUCTIONS Discharge Instruction: APPLY FIRST LAYER UNNA BOOT AT BASES OF TOES AND JUST BELOW THE KNEE TO HOLD COMPRESSION WRAP IN PLACE. Compression Stockings Add-Ons Electronic Signature(s) Signed: 03/19/2023 5:18:28 PM By: [name] MD Entered By: [name] on 03/19/2023 11:38:00 -------------------------------------------------------------------------------- Multi-Disciplinary Care Plan Details Patient Name: Date of Service: Jamie Brink, [name] 03/19/2023 11:00 A M Medical Record Number: [id number] Patient Account Number: [id number] Date of Birth/Sex: Treating RN: 11-Sep-1940 (82 y.o. Debara Pickett, Yvonne Kendall Primary Care Atreyu Mak: Belva Agee Other Clinician: Referring Carolyne Whitsel: Treating Sinahi Knights/Extender: Elby Beck in Treatment: 69 Multidisciplinary Care Plan reviewed with physician Active Inactive Pain, Acute or Chronic Nursing Diagnoses: Pain Management - Cyclic Acute (Dressing Change Related) Pain Management - Non-cyclic Acute (Procedural) Pain, acute or chronic: actual or potential Goals: Patient will verbalize adequate pain control and receive pain control interventions during procedures as needed Date Initiated: 09/11/2022 Target Resolution Date: 05/09/2023 Goal Status: Active Patient/caregiver will verbalize adequate pain control between visits Date Initiated: 09/11/2022 Target Resolution Date: 05/09/2023 Goal Status:  Active Patient/caregiver will verbalize comfort level met Date Initiated: 09/11/2022 Target Resolution Date: 05/09/2023 Goal Status: Active Interventions: Complete pain assessment as per visit requirements Encourage patient to take pain medications as prescribed Provide education on pain management Provision of support: recognize patient pain, provide comfort and support as needed Reposition patient for comfort Treatment Activities: Administer pain control measures as ordered : 09/11/2022 Notes: Jamie [name], Jamie [name] ([id number]) [phone].pdf Page 6 of 11 Electronic Signature(s) Signed: 03/19/2023 5:45:54 PM By: Shawn Stall RN, BSN Entered By: Shawn Stall on 03/19/2023 11:24:24 -------------------------------------------------------------------------------- Pain Assessment Details Patient Name: Date of Service: Jamie [name], [name] 03/19/2023 11:00 A M Medical Record Number: [id number] Patient Account Number: [id number] Date of Birth/Sex: Treating RN: 1941-03-24 (82 y.o. F) Primary Care Brycelynn Stampley: Belva Agee Other Clinician: Referring Azalea Cedar: Treating Derrill Bagnell/Extender: Elby Beck in Treatment: 43 Active Problems Location of Pain Severity and Description of Pain Patient Has Paino Yes Site Locations Rate the pain. Current Pain Level: 4 Worst Pain Level: 10 Least Pain Level: 0 Tolerable Pain Level: 2 Character of Pain Describe the Pain: Aching Pain Management and Medication Current Pain Management: Electronic Signature(s) Signed: 03/19/2023 1:06:29 PM By: Dayton Scrape Entered By: Dayton Scrape on 03/19/2023 11:00:26 -------------------------------------------------------------------------------- Patient/Caregiver Education Details Patient Name: Date of Service: Jamie [name], [name] 12/11/2024andnbsp11:00 A M Medical Record Number: [id number] Patient Account Number: [id number] Date of Birth/Gender: Treating RN: 27-Jan-1941  (82 y.o. [name] Primary Care Physician: Belva Agee Other Clinician: Referring Physician: Treating Physician/Extender: Elby Beck in Treatment: [address] JAXIE, SOKOLOWSKI ([id number])  [id number] Page 7 of 11 Education Provided To: Patient and Caregiver Education Topics Provided Wound/Skin Impairment: Handouts: Caring for Your Ulcer Methods: Explain/Verbal Responses: Reinforcements needed Electronic Signature(s) Signed: 03/19/2023 5:45:54 PM By: Shawn Stall RN, BSN Entered By: Shawn Stall on 03/19/2023 11:24:40 -------------------------------------------------------------------------------- Wound Assessment Details Patient Name: Date of Service: Jamie [name], [name] 03/19/2023 11:00 A M Medical Record Number: [id number] Patient Account Number: [id number] Date of Birth/Sex: Treating RN: 1940-10-12 (82 y.o. F) Primary Care Rosie Golson: Belva Agee Other Clinician: Referring Makella Buckingham: Treating Mikaylah Libbey/Extender: Elby Beck in Treatment: 43 Wound Status Wound Number: 5 Primary Venous Leg Ulcer Etiology: Wound Location: Right, Lateral Lower Leg Wound Open Wounding Event: Trauma Status: Date Acquired: 04/17/2022 Comorbid Sleep Apnea, Hypertension, Peripheral Venous Disease, Weeks Of Treatment: 43 History: Osteoarthritis, Neuropathy Clustered Wound: Yes Photos Wound Measurements Length: (cm) 1.2 Width: (cm) 1.2 Depth: (cm) 0.1 Area: (cm) 1.131 Volume: (cm) 0.113 % Reduction in Area: 95.7% % Reduction in Volume: 98.6% Epithelialization: Medium (34-66%) Tunneling: No Undermining: No Wound Description Classification: Full Thickness Without Exposed Support Wound Margin: Thickened Exudate Amount: Medium Exudate Type: Serosanguineous Exudate Color: red, brown Structures Foul Odor After Cleansing: No Slough/Fibrino Yes Wound Bed Granulation Amount: Medium (34-66%)  Exposed Structure Granulation Quality: Red, Pink Fascia Exposed: No Necrotic Amount: Medium (34-66%) Fat Layer (Subcutaneous Tissue) Exposed: Yes Necrotic Quality: Adherent Slough Tendon Exposed: No Jamie [name], Jamie [name] ([id number]) [id number] Page 8 of 11 Muscle Exposed: No Joint Exposed: No Bone Exposed: No Periwound Skin Texture Texture Color No Abnormalities Noted: No No Abnormalities Noted: No Callus: No Atrophie Blanche: No Crepitus: No Cyanosis: No Excoriation: No Ecchymosis: No Induration: No Erythema: No Rash: No Hemosiderin Staining: Yes Scarring: Yes Mottled: No Pallor: No Moisture Rubor: No No Abnormalities Noted: No Dry / Scaly: No Temperature / Pain Maceration: No Temperature: No Abnormality Tenderness on Palpation: Yes Treatment Notes Wound #5 (Lower Leg) Wound Laterality: Right, Lateral Cleanser Soap and Water Discharge Instruction: May shower and wash wound with dial antibacterial soap and water prior to dressing change. Peri-Wound Care Sween Lotion (Moisturizing lotion) Discharge Instruction: Apply moisturizing lotion as directed Topical Skintegrity Hydrogel 4 (oz) Discharge Instruction: Apply hydrogel with sorbact compounding topical antibiotics Discharge Instruction: apply Keystone directly to wound bed under the hydrofera blue. Home Health start. MIX ACCORDING TO THE PHARMACY INSTRUCTIONS. Primary Dressing Cutimed Sorbact Swab Discharge Instruction: Apply to wound bed with Hydrogel Secondary Dressing ABD Pad, 8x10 Discharge Instruction: Apply over primary dressing as directed. Woven Gauze Sponge, Non-Sterile 4x4 in Discharge Instruction: Apply over primary dressing as directed. Zetuvit Plus 4x8 in Discharge Instruction: Or double ABD pad Apply over primary dressing as directed. Secured With Compression Wrap Kerlix Roll 4.5x3.1 (in/yd) Discharge Instruction: Apply Kerlix and Coban compression as  directed. Coban Self-Adherent Wrap 4x5 (in/yd) Discharge Instruction: Apply over Kerlix as directed. unna boot FIRST LAYER **** SEE INSTRUCTIONS Discharge Instruction: APPLY FIRST LAYER UNNA BOOT AT BASES OF TOES AND JUST BELOW THE KNEE TO HOLD COMPRESSION WRAP IN PLACE. Compression Stockings Add-Ons Electronic Signature(s) Signed: 03/19/2023 5:45:54 PM By: Shawn Stall RN, BSN Entered By: Shawn Stall on 03/19/2023 11:22:30 Jamie [name] ([id number]) [id number] Page 9 of 11 -------------------------------------------------------------------------------- Wound Assessment Details Patient Name: Date of Service: Jamie [name], [name]. 03/19/2023 11:00 A M Medical Record Number: [id number] Patient Account Number: [id number] Date of Birth/Sex: Treating RN: 06-Apr-1941 (82 y.o. F) Primary Care Aryana Wonnacott: Belva Agee Other Clinician: Referring Fabiano Ginley: Treating Rosha Cocker/Extender: Elby Beck in Treatment: 43 Wound Status Wound  Number: 7 Primary Venous Leg Ulcer Etiology: Wound Location: Right, Posterior Lower Leg Wound Open Wounding Event: Gradually Appeared Status: Date Acquired: 01/01/2023 Comorbid Sleep Apnea, Hypertension, Peripheral Venous Disease, Weeks Of Treatment: 11 History: Osteoarthritis, Neuropathy Clustered Wound: No Photos Wound Measurements Length: (cm) 1 Width: (cm) 0. Depth: (cm) 0. Area: (cm) 0 Volume: (cm) 0 % Reduction in Area: 98% 3 % Reduction in Volume: 99% 1 Epithelialization: Large (67-100%) .236 Tunneling: No .024 Undermining: No Wound Description Classification: Full Thickness Without Exposed Support Wound Margin: Thickened Exudate Amount: Medium Exudate Type: Serosanguineous Exudate Color: red, brown Structures Foul Odor After Cleansing: No Slough/Fibrino Yes Wound Bed Granulation Amount: Large (67-100%) Exposed Structure Granulation Quality: Red, Pink Fascia Exposed: No Necrotic  Amount: Small (1-33%) Fat Layer (Subcutaneous Tissue) Exposed: Yes Necrotic Quality: Adherent Slough Tendon Exposed: No Muscle Exposed: No Joint Exposed: No Bone Exposed: No Periwound Skin Texture Texture Color No Abnormalities Noted: No No Abnormalities Noted: No Callus: No Atrophie Blanche: No Crepitus: No Cyanosis: No Excoriation: No Ecchymosis: No Induration: No Erythema: No Rash: No Hemosiderin Staining: No Scarring: No Mottled: No Pallor: No Moisture Rubor: No No Abnormalities Noted: No Dry / Scaly: Yes Temperature / Pain Maceration: No Temperature: No Abnormality Tenderness on PalpationELLON, Jamie [name] ([id number]) [id number] Page 10 of 11 Treatment Notes Wound #7 (Lower Leg) Wound Laterality: Right, Posterior Cleanser Soap and Water Discharge Instruction: May shower and wash wound with dial antibacterial soap and water prior to dressing change. Peri-Wound Care Sween Lotion (Moisturizing lotion) Discharge Instruction: Apply moisturizing lotion as directed Topical Skintegrity Hydrogel 4 (oz) Discharge Instruction: Apply hydrogel with sorbact compounding topical antibiotics Discharge Instruction: apply Keystone directly to wound bed under the hydrofera blue. Home Health start. MIX ACCORDING TO THE PHARMACY INSTRUCTIONS. Primary Dressing Cutimed Sorbact Swab Discharge Instruction: Apply to wound bed with Hydrogel Secondary Dressing ABD Pad, 8x10 Discharge Instruction: Apply over primary dressing as directed. Woven Gauze Sponge, Non-Sterile 4x4 in Discharge Instruction: Apply over primary dressing as directed. Zetuvit Plus 4x8 in Discharge Instruction: Or double ABD pad Apply over primary dressing as directed. Secured With Compression Wrap Kerlix Roll 4.5x3.1 (in/yd) Discharge Instruction: Apply Kerlix and Coban compression as directed. Coban Self-Adherent Wrap 4x5 (in/yd) Discharge Instruction: Apply over Kerlix as  directed. unna boot FIRST LAYER **** SEE INSTRUCTIONS Discharge Instruction: APPLY FIRST LAYER UNNA BOOT AT BASES OF TOES AND JUST BELOW THE KNEE TO HOLD COMPRESSION WRAP IN PLACE. Compression Stockings Add-Ons Electronic Signature(s) Signed: 03/19/2023 5:45:54 PM By: Shawn Stall RN, BSN Entered By: Shawn Stall on 03/19/2023 11:23:30 -------------------------------------------------------------------------------- Vitals Details Patient Name: Date of Service: Jamie Brink, [name] 03/19/2023 11:00 A M Medical Record Number: [id number] Patient Account Number: [id number] Date of Birth/Sex: Treating RN: [birth year] (82 y.o. F) Primary Care Mikela Senn: Belva Agee Other Clinician: Referring Norma Montemurro: Treating Wolf Boulay/Extender: Elby Beck in Treatment: 43 Vital Signs Time Taken: 10:59 Temperature (F): 98 Height (in): 62 Pulse (bpm): 69 Weight (lbs): 171 Respiratory Rate (breaths/min): [address] ([id number]) [phone].pdf Page 11 of 11 Body Mass Index (BMI): 31.3 Blood Pressure (mmHg): 146/87 Reference Range: 80 - 120 mg / dl Electronic Signature(s) Signed: 03/19/2023 1:06:29 PM By: Dayton Scrape Entered By: Dayton Scrape on 03/19/2023 11:00:07

## 2023-03-26 ENCOUNTER — Encounter (HOSPITAL_BASED_OUTPATIENT_CLINIC_OR_DEPARTMENT_OTHER): Payer: Medicare HMO | Admitting: Internal Medicine

## 2023-03-26 DIAGNOSIS — I87331 Chronic venous hypertension (idiopathic) with ulcer and inflammation of right lower extremity: Secondary | ICD-10-CM | POA: Diagnosis not present

## 2023-03-28 NOTE — Progress Notes (Signed)
JEREE, SHEU ([id number]) [id number] Page 1 of 12 Visit Report for 03/26/2023 Arrival Information Details Patient Name: Date of Service: [name], [name]. 03/26/2023 11:00 A M Medical Record Number: [id number] Patient Account Number: [id number] Date of Birth/Sex: Treating RN: November 24, 1940 (82 y.o. Debara Pickett, Yvonne Kendall Primary Care Adrin [name]: Belva Agee Other Clinician: Referring Deletha Jaffee: Treating Rica Heather/Extender: Elby Beck in Treatment: 23 Visit Information History Since Last Visit Added or deleted any medications: No Patient Arrived: Wheel Chair Any new allergies or adverse reactions: No Arrival Time: 11:20 Had a fall or experienced change in No Accompanied By: niece activities of daily living that may affect Transfer Assistance: Manual risk of falls: Patient Identification Verified: Yes Signs or symptoms of abuse/neglect since last visito No Secondary Verification Process Completed: Yes Hospitalized since last visit: No Patient Requires Transmission-Based Precautions: No Implantable device outside of the clinic excluding No Patient Has Alerts: No cellular tissue based products placed in the center since last visit: Has Dressing in Place as Prescribed: Yes Has Compression in Place as Prescribed: Yes Pain Present Now: Yes Electronic Signature(s) Signed: 03/26/2023 5:35:56 PM By: Shawn Stall RN, BSN Entered By: Shawn Stall on 03/26/2023 11:28:16 -------------------------------------------------------------------------------- Clinic Level of Care Assessment Details Patient Name: Date of Service: [name], [place] W. 03/26/2023 11:00 A M Medical Record Number: [id number] Patient Account Number: [id number] Date of Birth/Sex: Treating RN: 1941-01-04 (82 y.o. [name] Primary Care Deandrea Rion: Belva Agee Other Clinician: Referring Dalicia Kisner: Treating Eulia Hatcher/Extender: Elby Beck in  Treatment: 44 Clinic Level of Care Assessment Items TOOL 4 Quantity Score X- 1 0 Use when only an EandM is performed on FOLLOW-UP visit ASSESSMENTS - Nursing Assessment / Reassessment X- 1 10 Reassessment of Co-morbidities (includes updates in patient status) X- 1 5 Reassessment of Adherence to Treatment Plan ASSESSMENTS - Wound and Skin A ssessment / Reassessment X - Simple Wound Assessment / Reassessment - one wound 1 5 []  - 0 Complex Wound Assessment / Reassessment - multiple wounds X- 1 10 Dermatologic / Skin Assessment (not related to wound area) ASSESSMENTS - Focused Assessment X- 1 5 Circumferential Edema Measurements - multi extremities []  - 0 Nutritional Assessment / Counseling / Intervention MELEANE, DEOLIVEIRA ([id number]) [id number] Page 2 of 12 []  - 0 Lower Extremity Assessment (monofilament, tuning fork, pulses) []  - 0 Peripheral Arterial Disease Assessment (using hand held doppler) ASSESSMENTS - Ostomy and/or Continence Assessment and Care []  - 0 Incontinence Assessment and Management []  - 0 Ostomy Care Assessment and Management (repouching, etc.) PROCESS - Coordination of Care X - Simple Patient / Family Education for ongoing care 1 15 []  - 0 Complex (extensive) Patient / Family Education for ongoing care X- 1 10 Staff obtains Chiropractor, Records, T Results / Process Orders est []  - 0 Staff telephones HHA, Nursing Homes / Clarify orders / etc []  - 0 Routine Transfer to another Facility (non-emergent condition) []  - 0 Routine Hospital Admission (non-emergent condition) []  - 0 New Admissions / Manufacturing engineer / Ordering NPWT Apligraf, etc. , []  - 0 Emergency Hospital Admission (emergent condition) X- 1 10 Simple Discharge Coordination []  - 0 Complex (extensive) Discharge Coordination PROCESS - Special Needs []  - 0 Pediatric / Minor Patient Management []  - 0 Isolation Patient Management []  - 0 Hearing / Language  / Visual special needs []  - 0 Assessment of Community assistance (transportation, D/C planning, etc.) []  - 0 Additional assistance / Altered mentation []  - 0 Support Surface(s) Assessment (bed, cushion, seat,  etc.) INTERVENTIONS - Wound Cleansing / Measurement X - Simple Wound Cleansing - one wound 1 5 []  - 0 Complex Wound Cleansing - multiple wounds X- 1 5 Wound Imaging (photographs - any number of wounds) []  - 0 Wound Tracing (instead of photographs) X- 1 5 Simple Wound Measurement - one wound []  - 0 Complex Wound Measurement - multiple wounds INTERVENTIONS - Wound Dressings []  - 0 Small Wound Dressing one or multiple wounds []  - 0 Medium Wound Dressing one or multiple wounds X- 1 20 Large Wound Dressing one or multiple wounds []  - 0 Application of Medications - topical []  - 0 Application of Medications - injection INTERVENTIONS - Miscellaneous []  - 0 External ear exam []  - 0 Specimen Collection (cultures, biopsies, blood, body fluids, etc.) []  - 0 Specimen(s) / Culture(s) sent or taken to Lab for analysis []  - 0 Patient Transfer (multiple staff / Nurse, adult / Similar devices) []  - 0 Simple Staple / Suture removal (25 or less) []  - 0 Complex Staple / Suture removal (26 or more) []  - 0 Hypo / Hyperglycemic Management (close monitor of Blood Glucose) HABY, ROURKE ([id number]) [id number] Page 3 of 12 []  - 0 Ankle / Brachial Index (ABI) - do not check if billed separately X- 1 5 Vital Signs Has the patient been seen at the hospital within the last three years: Yes Total Score: 110 Level Of Care: New/Established - Level 3 Electronic Signature(s) Signed: 03/26/2023 5:35:56 PM By: Shawn Stall RN, BSN Entered By: Shawn Stall on 03/26/2023 11:58:41 -------------------------------------------------------------------------------- Encounter Discharge Information Details Patient Name: Date of Service: [name], [name]  03/26/2023 11:00 A M Medical Record Number: [id number] Patient Account Number: [id number] Date of Birth/Sex: Treating RN: 02-12-1941 (82 y.o. [name] Primary Care Johnwesley Lederman: Belva Agee Other Clinician: Referring Willadean Guyton: Treating Chanelle Hodsdon/Extender: Elby Beck in Treatment: 20 Encounter Discharge Information Items Discharge Condition: Stable Ambulatory Status: Wheelchair Discharge Destination: Home Transportation: Private Auto Accompanied By: niece Schedule Follow-up Appointment: Yes Clinical Summary of Care: Electronic Signature(s) Signed: 03/26/2023 5:35:56 PM By: Shawn Stall RN, BSN Entered By: Shawn Stall on 03/26/2023 11:59:28 -------------------------------------------------------------------------------- Lower Extremity Assessment Details Patient Name: Date of Service: Gevena Mart W. 03/26/2023 11:00 A M Medical Record Number: [id number] Patient Account Number: [id number] Date of Birth/Sex: Treating RN: 02-23-1941 (82 y.o. [name] Primary Care Rivkah Wolz: Belva Agee Other Clinician: Referring Jayzen Paver: Treating Shawnn Bouillon/Extender: Elby Beck in Treatment: 44 Edema Assessment Assessed: [name]: No] Franne Forts: Yes] Edema: [Left: N] [Right: o] Calf Left: Right: Point of Measurement: From Medial Instep 33 cm Ankle Left: Right: Point of Measurement: From Medial Instep 25 cm Vascular Assessment Left: [[id number] Page 4 of 12Right:] Pulses: Dorsalis Pedis Palpable: [[id number] Page 4 of 12Yes] Extremity colors, hair growth, and conditions: Extremity Color: [[id number] Page 4 of 12Normal] Hair Growth on Extremity: [[id number].pdf Page 4 of 12No] Temperature of Extremity: [[id number].pdf Page 4 of 12Warm] Capillary Refill: [phone].pdf Page 4 of 12< 3  seconds] Dependent Rubor: [[id number] Page 4 of 12No] Blanched when Elevated: [phone].pdf Page 4 of 12No No] Toe Nail Assessment Left: Right: Thick: Yes Discolored: Yes Deformed: Yes Improper Length and Hygiene: Yes Electronic Signature(s) Signed: 03/26/2023 5:35:56 PM By: Shawn Stall RN, BSN Entered By: Shawn Stall on 03/26/2023 11:29:30 -------------------------------------------------------------------------------- Multi Wound Chart Details Patient Name: Date of Service: [name], [name] 03/26/2023 11:00 A M Medical Record Number: [id number] Patient Account Number: [id number] Date of Birth/Sex: Treating RN: 1940/10/30 (82 y.o.  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 N/A Primary Etiology: Sleep Apnea, Hypertension, Peripheral Sleep Apnea, Hypertension, Peripheral N/A Comorbid History: Venous Disease, Osteoarthritis, Venous Disease, Osteoarthritis, Neuropathy Neuropathy 04/17/2022 01/01/2023 N/A Date Acquired: 29 12 N/A Weeks of Treatment: Open Open N/A Wound Status: No No N/A Wound Recurrence: Yes No N/A Clustered Wound: 1x0.4x0.1 3x2x0.1 N/A Measurements L x W x D (cm) 0.314 4.712 N/A A (cm) : rea 0.031 0.471 N/A Volume (cm) : 98.80% 59.90% N/A % Reduction in Area: 99.60% 80.00% N/A % Reduction in VolumeALYANNA, KEIMIG ([id number]) [id number] Page 5 of 12 Full Thickness Without Exposed Full Thickness  Without Exposed N/A Classification: Support Structures Support Structures Medium Medium N/A Exudate Amount: Serosanguineous Serosanguineous N/A Exudate Type: red, brown red, brown N/A Exudate Color: Thickened Thickened N/A Wound Margin: Large (67-100%) Large (67-100%) N/A Granulation Amount: Red, Pink Red, Pink N/A Granulation Quality: Small (1-33%) Small (1-33%) N/A Necrotic Amount: Fat Layer (Subcutaneous Tissue): Yes Fat Layer (Subcutaneous Tissue): Yes N/A Exposed Structures: Fascia: No Fascia: No Tendon: No Tendon: No Muscle: No Muscle: No Joint: No Joint: No Bone: No Bone: No Large (67-100%) Medium (34-66%) N/A Epithelialization: Scarring: Yes Excoriation: No N/A Periwound Skin Texture: Excoriation: No Induration: No Induration: No Callus: No Callus: No Crepitus: No Crepitus: No Rash: No Rash: No Scarring: No Maceration: No Dry/Scaly: Yes N/A Periwound Skin Moisture: Dry/Scaly: No Maceration: No Hemosiderin Staining: Yes Atrophie Blanche: No N/A Periwound Skin Color: Atrophie Blanche: No Cyanosis: No Cyanosis: No Ecchymosis: No Ecchymosis: No Erythema: No Erythema: No Hemosiderin Staining: No Mottled: No Mottled: No Pallor: No Pallor: No Rubor: No Rubor: No No Abnormality No Abnormality N/A Temperature: Yes Yes N/A Tenderness on Palpation: Treatment Notes Wound #5 (Lower Leg) Wound Laterality: Right, Lateral Cleanser Soap and Water Discharge Instruction: May shower and wash wound with dial antibacterial soap and water prior to dressing change. Peri-Wound Care Sween Lotion (Moisturizing lotion) Discharge Instruction: Apply moisturizing lotion as directed Topical Skintegrity Hydrogel 4 (oz) Discharge Instruction: Apply hydrogel with sorbact Primary Dressing PolyMem Silver Non-Adhesive Dressing, 4.25x4.25 in Discharge Instruction: Apply to wound bed as instructed Secondary Dressing ABD Pad, 8x10 Discharge Instruction: Apply over  primary dressing as directed. Woven Gauze Sponge, Non-Sterile 4x4 in Discharge Instruction: Apply over primary dressing as directed. Secured With Compression Wrap Kerlix Roll 4.5x3.1 (in/yd) Discharge Instruction: Apply Kerlix and Coban compression as directed. Coban Self-Adherent Wrap 4x5 (in/yd) Discharge Instruction: Apply over Kerlix as directed. unna boot FIRST LAYER **** SEE INSTRUCTIONS Discharge Instruction: APPLY FIRST LAYER UNNA BOOT AT BASES OF TOES AND JUST BELOW THE KNEE TO HOLD COMPRESSION WRAP IN PLACE. Compression Stockings Add-Ons Wound #7 (Lower Leg) Wound Laterality: Right, Posterior Cleanser TANELLE, HAUSEN ([id number]) [id number] Page 6 of 12 Soap and Water Discharge Instruction: May shower and wash wound with dial antibacterial soap and water prior to dressing change. Peri-Wound Care Sween Lotion (Moisturizing lotion) Discharge Instruction: Apply moisturizing lotion as directed Topical Skintegrity Hydrogel 4 (oz) Discharge Instruction: Apply hydrogel with sorbact Primary Dressing PolyMem Silver Non-Adhesive Dressing, 4.25x4.25 in Discharge Instruction: Apply to wound bed as instructed Secondary Dressing ABD Pad, 8x10 Discharge Instruction: Apply over primary dressing as directed. Woven  Gauze Sponge, Non-Sterile 4x4 in Discharge Instruction: Apply over primary dressing as directed. Secured With Compression Wrap Kerlix Roll 4.5x3.1 (in/yd) Discharge Instruction: Apply Kerlix and Coban compression as directed. Coban Self-Adherent Wrap 4x5 (in/yd) Discharge Instruction: Apply over Kerlix as directed. unna boot FIRST LAYER **** SEE INSTRUCTIONS Discharge Instruction: APPLY FIRST LAYER UNNA BOOT AT BASES OF TOES AND JUST BELOW THE KNEE TO HOLD COMPRESSION WRAP IN PLACE. Compression Stockings Add-Ons Electronic Signature(s) Signed: 03/27/2023 12:11:14 PM By: [name] MD Entered By: [name] on 03/26/2023  12:30:29 -------------------------------------------------------------------------------- Multi-Disciplinary Care Plan Details Patient Name: Date of Service: [name], [name] 03/26/2023 11:00 A M Medical Record Number: [id number] Patient Account Number: [id number] Date of Birth/Sex: Treating RN: May 11, 1940 (82 y.o. Debara Pickett, Yvonne Kendall Primary Care Santiaga Butzin: Belva Agee Other Clinician: Referring Charis Juliana: Treating Kameria Canizares/Extender: Elby Beck in Treatment: 44 Multidisciplinary Care Plan reviewed with physician Active Inactive Pain, Acute or Chronic Nursing Diagnoses: Pain Management - Cyclic Acute (Dressing Change Related) Pain Management - Non-cyclic Acute (Procedural) Pain, acute or chronic: actual or potential Goals: Patient will verbalize adequate pain control and receive pain control interventions during procedures as needed Date Initiated: 09/11/2022 Target Resolution Date: 05/09/2023 MERRY, CARLOS ([id number]) [phone].pdf Page 7 of 12 Goal Status: Active Patient/caregiver will verbalize adequate pain control between visits Date Initiated: 09/11/2022 Target Resolution Date: 05/09/2023 Goal Status: Active Patient/caregiver will verbalize comfort level met Date Initiated: 09/11/2022 Target Resolution Date: 05/09/2023 Goal Status: Active Interventions: Complete pain assessment as per visit requirements Encourage patient to take pain medications as prescribed Provide education on pain management Provision of support: recognize patient pain, provide comfort and support as needed Reposition patient for comfort Treatment Activities: Administer pain control measures as ordered : 09/11/2022 Notes: Electronic Signature(s) Signed: 03/26/2023 5:35:56 PM By: Shawn Stall RN, BSN Entered By: Shawn Stall on 03/26/2023 11:36:34 -------------------------------------------------------------------------------- Pain Assessment  Details Patient Name: Date of Service: [name], [name] 03/26/2023 11:00 A M Medical Record Number: [id number] Patient Account Number: [id number] Date of Birth/Sex: Treating RN: Sep 13, 1940 (82 y.o. [name] Primary Care Rhiley Solem: Belva Agee Other Clinician: Referring Jannae Fagerstrom: Treating Maliah Pyles/Extender: Elby Beck in Treatment: 44 Active Problems Location of Pain Severity and Description of Pain Patient Has Paino Yes Site Locations Rate the pain. Current Pain Level: 7 Pain Management and Medication Current Pain Management: Medication: No Cold Application: No Rest: No Massage: No Activity: No T.E.N.S.: No Heat Application: No Leg drop or elevation: No Is the Current Pain Management Adequate: Adequate How does your wound impact your activities of daily livingo Sleep: No Bathing: No Appetite: No Relationship With Others: No ZOEIE, SOTELO ([id number]) [id number].pdf Page 8 of 12 Bladder Continence: No Emotions: No Bowel Continence: No Work: No Toileting: No Drive: No Dressing: No Hobbies: No Psychologist, prison and probation services) Signed: 03/26/2023 5:35:56 PM By: Shawn Stall RN, BSN Entered By: Shawn Stall on 03/26/2023 11:28:53 -------------------------------------------------------------------------------- Patient/Caregiver Education Details Patient Name: Date of Service: [place] N, [name] 12/18/2024andnbsp11:00 A M Medical Record Number: [id number] Patient Account Number: [id number] Date of Birth/Gender: Treating RN: 02-25-1941 (82 y.o. [name] Primary Care Physician: Belva Agee Other Clinician: Referring Physician: Treating Physician/Extender: Elby Beck in Treatment: 90 Education Assessment Education Provided To: Patient Education Topics Provided Wound/Skin Impairment: Handouts: Caring for Your Ulcer Methods: Explain/Verbal Responses: Reinforcements  needed Electronic Signature(s) Signed: 03/26/2023 5:35:56 PM By: Shawn Stall RN, BSN Entered By: Shawn Stall on 03/26/2023 11:36:44 -------------------------------------------------------------------------------- Wound Assessment Details Patient Name:  Date of Service: [name], [name]. 03/26/2023 11:00 A M Medical Record Number: [id number] Patient Account Number: [id number] Date of Birth/Sex: Treating RN: 21-Jun-1940 (82 y.o. [name] Primary Care Lizanne Erker: Belva Agee Other Clinician: Referring Jeorgia Helming: Treating Gladiola Madore/Extender: Elby Beck in Treatment: 44 Wound Status Wound Number: 5 Primary Venous Leg Ulcer Etiology: Wound Location: Right, Lateral Lower Leg Wound Open Wounding Event: Trauma Status: Date Acquired: 04/17/2022 Comorbid Sleep Apnea, Hypertension, Peripheral Venous Disease, Weeks Of Treatment: 44 History: Osteoarthritis, Neuropathy Clustered Wound: Yes Photos LONNI, THRASHER ([id number]) [phone].pdf Page 9 of 12 Wound Measurements Length: (cm) 1 Width: (cm) 0.4 Depth: (cm) 0.1 Area: (cm) 0.314 Volume: (cm) 0.031 % Reduction in Area: 98.8% % Reduction in Volume: 99.6% Epithelialization: Large (67-100%) Tunneling: No Undermining: No Wound Description Classification: Full Thickness Without Exposed Support Structures Wound Margin: Thickened Exudate Amount: Medium Exudate Type: Serosanguineous Exudate Color: red, brown Foul Odor After Cleansing: No Slough/Fibrino Yes Wound Bed Granulation Amount: Large (67-100%) Exposed Structure Granulation Quality: Red, Pink Fascia Exposed: No Necrotic Amount: Small (1-33%) Fat Layer (Subcutaneous Tissue) Exposed: Yes Necrotic Quality: Adherent Slough Tendon Exposed: No Muscle Exposed: No Joint Exposed: No Bone Exposed: No Periwound Skin Texture Texture Color No Abnormalities Noted: No No Abnormalities Noted: No Callus: No Atrophie  Blanche: No Crepitus: No Cyanosis: No Excoriation: No Ecchymosis: No Induration: No Erythema: No Rash: No Hemosiderin Staining: Yes Scarring: Yes Mottled: No Pallor: No Moisture Rubor: No No Abnormalities Noted: No Dry / Scaly: No Temperature / Pain Maceration: No Temperature: No Abnormality Tenderness on Palpation: Yes Treatment Notes Wound #5 (Lower Leg) Wound Laterality: Right, Lateral Cleanser Soap and Water Discharge Instruction: May shower and wash wound with dial antibacterial soap and water prior to dressing change. Peri-Wound Care Sween Lotion (Moisturizing lotion) Discharge Instruction: Apply moisturizing lotion as directed Topical Skintegrity Hydrogel 4 (oz) Discharge Instruction: Apply hydrogel with sorbact Primary Dressing PolyMem Silver Non-Adhesive Dressing, 4.25x4.25 in Discharge Instruction: Apply to wound bed as instructed Secondary Dressing ABD Pad, 8x10 Discharge Instruction: Apply over primary dressing as directed. IRASEMA, MCKITRICK ([id number]) [id number] Page 10 of 12 Woven Gauze Sponge, Non-Sterile 4x4 in Discharge Instruction: Apply over primary dressing as directed. Secured With Compression Wrap Kerlix Roll 4.5x3.1 (in/yd) Discharge Instruction: Apply Kerlix and Coban compression as directed. Coban Self-Adherent Wrap 4x5 (in/yd) Discharge Instruction: Apply over Kerlix as directed. unna boot FIRST LAYER **** SEE INSTRUCTIONS Discharge Instruction: APPLY FIRST LAYER UNNA BOOT AT BASES OF TOES AND JUST BELOW THE KNEE TO HOLD COMPRESSION WRAP IN PLACE. Compression Stockings Add-Ons Electronic Signature(s) Signed: 03/26/2023 5:28:18 PM By: Thayer Dallas Signed: 03/26/2023 5:35:56 PM By: Shawn Stall RN, BSN Entered By: Thayer Dallas on 03/26/2023 11:31:00 -------------------------------------------------------------------------------- Wound Assessment Details Patient Name: Date of Service: [name], [name] 03/26/2023 11:00 A M Medical Record Number: [id number] Patient Account Number: [id number] Date of Birth/Sex: Treating RN: Aug 11, 1940 (82 y.o. Debara Pickett, Yvonne Kendall Primary Care Geisha Abernathy: Belva Agee Other Clinician: Referring Rithik Odea: Treating Armany Mano/Extender: Elby Beck in Treatment: 44 Wound Status Wound Number: 7 Primary Venous Leg Ulcer Etiology: Wound Location: Right, Posterior Lower Leg Wound Open Wounding Event: Gradually Appeared Status: Date Acquired: 01/01/2023 Comorbid Sleep Apnea, Hypertension, Peripheral Venous Disease, Weeks Of Treatment: 12 History: Osteoarthritis, Neuropathy Clustered Wound: No Photos Wound Measurements Length: (cm) 3 Width: (cm) 2 Depth: (cm) 0.1 Area: (cm) 4.712 Volume: (cm) 0.471 % Reduction in Area: 59.9% % Reduction in Volume: 80% Epithelialization: Medium (34-66%) Tunneling: No Undermining: No Wound  Description Classification: Full Thickness Without Exposed Support Structures Wound Margin: Thickened Exudate Amount: Medium TINEY, CRUMBLEY ([id number]) Exudate Type: Serosanguineous Exudate Color: red, brown Foul Odor After Cleansing: No Slough/Fibrino Yes [phone].pdf Page 11 of 12 Wound Bed Granulation Amount: Large (67-100%) Exposed Structure Granulation Quality: Red, Pink Fascia Exposed: No Necrotic Amount: Small (1-33%) Fat Layer (Subcutaneous Tissue) Exposed: Yes Necrotic Quality: Adherent Slough Tendon Exposed: No Muscle Exposed: No Joint Exposed: No Bone Exposed: No Periwound Skin Texture Texture Color No Abnormalities Noted: No No Abnormalities Noted: No Callus: No Atrophie Blanche: No Crepitus: No Cyanosis: No Excoriation: No Ecchymosis: No Induration: No Erythema: No Rash: No Hemosiderin Staining: No Scarring: No Mottled: No Pallor: No Moisture Rubor: No No Abnormalities Noted: No Dry / Scaly: Yes Temperature / Pain Maceration:  No Temperature: No Abnormality Tenderness on Palpation: Yes Treatment Notes Wound #7 (Lower Leg) Wound Laterality: Right, Posterior Cleanser Soap and Water Discharge Instruction: May shower and wash wound with dial antibacterial soap and water prior to dressing change. Peri-Wound Care Sween Lotion (Moisturizing lotion) Discharge Instruction: Apply moisturizing lotion as directed Topical Skintegrity Hydrogel 4 (oz) Discharge Instruction: Apply hydrogel with sorbact Primary Dressing PolyMem Silver Non-Adhesive Dressing, 4.25x4.25 in Discharge Instruction: Apply to wound bed as instructed Secondary Dressing ABD Pad, 8x10 Discharge Instruction: Apply over primary dressing as directed. Woven Gauze Sponge, Non-Sterile 4x4 in Discharge Instruction: Apply over primary dressing as directed. Secured With Compression Wrap Kerlix Roll 4.5x3.1 (in/yd) Discharge Instruction: Apply Kerlix and Coban compression as directed. Coban Self-Adherent Wrap 4x5 (in/yd) Discharge Instruction: Apply over Kerlix as directed. unna boot FIRST LAYER **** SEE INSTRUCTIONS Discharge Instruction: APPLY FIRST LAYER UNNA BOOT AT BASES OF TOES AND JUST BELOW THE KNEE TO HOLD COMPRESSION WRAP IN PLACE. Compression Stockings Add-Ons Electronic Signature(s) Signed: 03/26/2023 5:28:18 PM By: Thayer Dallas Signed: 03/26/2023 5:35:56 PM By: Shawn Stall RN, BSN Entered By: Thayer Dallas on 03/26/2023 11:31:48 [name] ([id number]) [id number] Page 12 of 12 -------------------------------------------------------------------------------- Vitals Details Patient Name: Date of Service: [name], [name]. 03/26/2023 11:00 A M Medical Record Number: [id number] Patient Account Number: [id number] Date of Birth/Sex: Treating RN: 11/11/1940 (82 y.o. Debara Pickett, Yvonne Kendall Primary Care Garth Diffley: Belva Agee Other Clinician: Referring Dave Mannes: Treating Markese Bloxham/Extender: Elby Beck in Treatment: 44 Vital Signs Time Taken: 11:25 Temperature (F): 98.1 Height (in): 62 Pulse (bpm): 60 Weight (lbs): 171 Respiratory Rate (breaths/min): 20 Body Mass Index (BMI): 31.3 Blood Pressure (mmHg): 104/57 Reference Range: 80 - 120 mg / dl Electronic Signature(s) Signed: 03/26/2023 5:35:56 PM By: Shawn Stall RN, BSN Entered By: Shawn Stall on 03/26/2023 11:28:43

## 2023-03-28 NOTE — Progress Notes (Signed)
AMELLE, GUIRE ([id number]) [id number] Page 1 of 11 Visit Report for 03/26/2023 HPI Details Patient Name: Date of Service: Jamie [name], [name]. 03/26/2023 11:00 A M Medical Record Number: [id number] Patient Account Number: [id number] Date of Birth/Sex: Treating RN: 04-30-1940 (82 y.o. F) Primary Care Provider: Belva [name] Other Clinician: Referring Provider: Treating Provider/Extender: Elby Beck in Treatment: 44 History of Present Illness HPI Description: ADMISSION 06/30/2020 [name] is an 82 year old woman who lives in [place]. She is here with her niece for review of wounds on the left medial lower leg and ankle. These have apparently been present for over a year and she followed with Dr. Olegario Messier at the [facility] in [place] for quite a period of time although it looks as though there was a initial consult wound from Dr. Marcha Solders on February 18 presumably there was therefore hiatus. At that point the wounds were described as being there for 3 months. She also tells me she was at the wound care center in [place] for a period of time with this. 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 She had venous studies that were negative for DVT ABIs on the right were 1.01 on the left 1.06. She has . had previous applications of puraply, compression which she does not tolerate very well. She has had several rounds of oral antibiotic therapy. She complains of unrelenting pain and she is seeing Dr. Reece Agar V of pain management apparently was on oxycodone but that did not help. She also had a skin biopsy done by Dr. Olegario Messier although we do not have that result. She does not appear to have an arterial issue. I am not completely clear what she has been putting on the wounds lately. 3/31; this patient has a particularly nasty set of wounds on the left medial ankle in the middle of what looks to  be hemosiderin deposition secondary to chronic venous insufficiency. She has a lot of pain followed by pain management. Dr. Marcha Solders apparently did a biopsy of something on the left leg last fall what I would like to get this result. She has a history of MRSA treatment. I do not believe she had reflux studies but she did have DVT rule out studies. Previous ABIs have not suggested arterial insufficiency 4/7; difficult wounds on the left medial ankle probably chronic venous insufficiency. With considerable effort on behalf of our case manager we were able to finally to speak to somebody at the hospital in [place] who indicated that no biopsy of this area have been done even though the patient describes this in some detail and is even able to point out where she thinks the biopsy was done. We have been using Sorbact. The PCR culture I did showed polymicrobial identification with Pseudomonas, staph aureus, Peptostreptococcus. All of this and low titers. Resistance genes identified were MRSA, staph virulence gene and tetracycline. We are going to send this to [facility] for a topical antibiotic which is something that we have had good success with recently in large venous ulcers with a lot of purulent drainage. There would not be an easy oral alternative here possibly line escalated and ciprofloxacin if we need to use systemic antibiotic 4/15; difficult area on the left medial ankle. Most likely chronic venous insufficiency. I think she will probably need venous reflux study I think she had DVT rule outs but not venous reflux studies. We have not yet obtained the topical antibiotics. She has  home health changing the dressing we have been using Sorbact for adherent fibrinous debris on the surface. Very difficult to remove 4/22; patient presents for 1 week follow-up. She has been using sore back under compression wraps and these are changed 3 times a week with home health. She also had Keystone antibiotics sent to  her house and brought them in today. She has no complaints or issues today. 5/2; patient is here for follow-up. She has been using Sorbact under compression. Very painful wound. She has been using Keystone antibiotics. Not much improvement although the more medial part of the wound has cleaned up nicely and the larger part of the wound about 50% slough covered. Part of the issue here is that she had stays at both [facility] wound care center, [facility] wound care center and now [place]. Not sure if she has had venous reflux studies. As far as we are able to tell she did not have a biopsy. My notes state that she did not have venous reflux studies just DVT rule outs. 5/16; patient goes for venous reflux studies this afternoon. She says that wound was biopsied which sounds like punch biopsies by Dr. Lynden Ang we do not have these results. We are using Sorbact. Very difficult wound to debride 5/23; patient presents for 1 week follow-up. She reports tolerating the wraps well with sorbact underneath. She had ABIs and venous reflux studies done. She has no issues or complaints today. She denies acute signs of infection. 6/6; I have reviewed the patient's vascular studies. Wounds are on the left medial and posterior calf. She had significant reflux in the greater saphenous vein in the in the mid thigh, distal thigh knee and the small saphenous vein in the popliteal fossa. The vein diameters do not look too impressive though. She is going to see the vascular surgeon on Wednesday. She also had venous reflux in the right common femoral vein. She did not have any evidence of a DVT or SVT I am wondering whether there is an ablation procedure that would benefit her in the greater saphenous vein on the left. . She tells [place] that home health put the dressing on too tight and she took off 1 layer. The swelling in her left leg is a little worse as a result of this. Not much change in the wound measurements so the surface of the  wound looks better Her arterial studies showed an ABI on the right of 1.14 with a triphasic waveform and a great toe pressure of 0.89. On the left her ABIs were noncompressible at 1.34 but with triphasic waveforms and a TBI of 0.97. Her greater toe pressure was 122. There was no evidence of significant bilateral arterial disease 6/20 patient went to see Dr. Durwin Nora. He did not think she had significant arterial disease. In terms of her venous duplex on the right side there was no evidence of a DVT or SVT there was deep venous reflux involving the common femoral vein no superficial vein reflux on the left side there was no DVT or SVT there was no deep vein graft reflux there was some reflux in the greater saphenous vein from the mid thigh to the knee but the vein here was not dilated. He thought these were venous wounds he prescribed a compression pump but I am not sure who we ordered it from The patient has been approved for Apligraf. Still using silver collagen this week 7/5; Apligraf #1 7/19 Apligraf #2. Decent improvement in the condition of  the wound bed. Epithelialization distal 8/2 Apligraf #3. No issues or complaints. Denies signs of infection. 8/17 Apligraf #4. No issues or concerns. Some complaints of pruritus and the rash. Our intake nurse brought up the fact that she had previously indicated possible cotton layer sensitivity we will therefore use kerlix in the bottom layer of the compression 8/30; the patient comes in with the area on her left medial leg just about healed. There is a superficial area more towards the tibia and a smaller open area Jamie [name], Jamie [name] ([id number]) [id number] Page 2 of 11 distally everything else is epithelialized. I do not think she requires another Apligraf 9/13; left medial leg is healed. 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 Her edema control is  good Readmisstion: 05-22-2022 upon evaluation today patient presents for initial inspection here in our clinic concerning issues that she has been having with a wound of the right lateral lower extremity. This is an area of a previous skin graft she tells me. With that being said she unfortunately had a scrape on this that occurred around 10 January. Since that time she has noted that this has just continued to get bigger and bigger in her niece who is present with her today actually states that 2 weeks ago when she saw that it was significantly smaller than what she sees currently. Obviously this is of utmost concern as we do not want this to continue to get larger when arrested and get moving in the right direction. Fortunately there does not appear to be any signs of systemic infection though locally I think we probably do have some infection present. I would obtain a culture to see what we have going on here and then we will subsequently see where things go going forward. Fortunately I think that she is in the right place at this point being at the wound center we can definitely do something to try to get this moving in the right direction. Patient does have a history of chronic venous insufficiency as well as coronary artery disease. In the past she has done well with compression wraps on the start with a 3 layer wrap I think she is probably can end up going to a 4-layer at some point but we will see how things do over the next week. She has been using wound cleanser along with she tells me a zinc and topical but again I am not sure exactly what that was. 05-29-2022 upon evaluation today patient appears to be doing well currently in regard to her wound. This actually is showing signs of improvement I am happy in that regard unfortunately her left leg has an area on the shin that has opened. This is the region where one of the areas at least we have previously taken care of. Nonetheless this is  small hoping we get it under control before things worsen significantly. 06-05-2022 upon evaluation today patient appears to be doing well currently in regard to her wounds. The actually seem to be fairly clean she is having still quite a bit of problem with pain at this point she would like to not have debridement today for all possible. With that being said I do believe that we are making some good progress I think the [facility] is doing a decent job here. 3/6; patient has had 3/6; patient with known severe chronic venous insufficiency. She apparently had a traumatic wound at home and has a  reasonably substantial wound on the right lateral lower leg and a smaller one on the left anterior lower leg as well. We have been using Hydrofera Blue and Unna boots 3/13; patient presents for follow-up. We have been using Hydrofera Blue under Coflex to the legs bilaterally. She has no issues or complaints today. 06-26-2022 upon evaluation today patient appears to be doing well currently in regard to her wound. This is measuring a little bit larger however. Fortunately I do not see any signs of infection this is on the right side. Fortunately the left side is actually completely healed which is great news. 07-03-2022 patient's wound unfortunately is continuing to show signs of being worse. Her compression which was the Tubigrip that I thought should be able to pull up actually slipped down and she was not able to pull that back up. With that being said that means that unfortunately her wound has continued to deteriorate since I last saw her. This is definitely not the direction that we are looking for. I discussed with her that I do believe we need to go ahead and see about getting her started on antibiotics and I subsequently would also like to go ahead and see about getting things moving forward with regard to a wound culture and making a change up her dressings as well but this can be changed more frequently  than just once a week. 07-10-2022 upon evaluation today patient actually appears to be doing much better. I do believe that the antibiotics have been beneficial for her. Fortunately I do not see any signs of active infection locally nor systemically which is great news. No fevers, chills, nausea, vomiting, or diarrhea. 07-17-2022 upon evaluation today patient appears to be doing well currently in regard to her wound which is actually showing signs of improvement this is slow but nonetheless prevalent that we are seeing good improvement here. Fortunately I do not see any signs of active infection locally nor systemically which is great news. 07-24-2022 upon evaluation today patient appears to be doing well currently in regard to her wound although the wrap that we had on has been slipping down and she really does not have anybody to help her with this at home health is not going to be coming out we could not get anybody that would actually be able to do the dressing changes. Fortunately I do not see any signs of active infection locally nor systemically which is great news. 08-07-22 poorly in regard to her leg compared to what it was previous. Fortunately there does not appear to be any signs of active infection locally nor systemically which is great news. No fevers, chills, nausea, vomiting, or diarrhea. With that being said it does appear that the patient may have some cellulitis in the leg which is not good. 08-14-2022 upon evaluation today patient appears to be doing somewhat better in regard to her wounds she still is having quite a bit of pain we are still not where we want to be as far as healing is concerned completely. Fortunately I do not see any evidence of active infection locally nor systemically which is great news and I am very pleased in that regard. 08-21-23 upon evaluation today patient appears to be doing poorly currently in regard to her wound. She has been tolerating the dressing changes  without complication. Fortunately there does not appear to be any signs of active infection locally nor systemically at this time. 08-28-2022 upon evaluation today patient appears to be doing  poorly in regard to her wound this was not wrapped appropriately home health did not go up high enough on the wrap. This has caused some issues and I discussed that with the patient today. I do believe that she is going to require aggressive wrapping and treatment which she is getting the [facility] topical antibiotics today they can start using that at the next wrap on Friday. In the meantime they need to make sure that the rapid and appropriately we wrote very specific orders today. 09-04-2022 upon evaluation today patient appears to be doing well currently in regard to her wound which I think is making progress is still hurting her quite significantly. Fortunately I do not see any signs of active infection locally or systemically which is great news. No fevers, chills, nausea, vomiting, or diarrhea. 09-11-2022 upon evaluation today patient's wound actually is showing signs of being a little bit smaller and looking a little bit better she still has a lot going on here however. Fortunately I do not see any evidence of active infection Worsening locally nor systemically which is great news. With that being said worsening still continue to use the topical Keystone antibiotics. 09-18-2022 upon evaluation today patient actually showing some signs of improvement. I am actually very pleased with where we stand compared to where we have been. I think that she is making good headway here. She is still having quite a bit of pain but it seems to be lessening compared to previous. 09-25-2022 upon evaluation patient's wound actually showing signs slowly but surely of improvement. Fortunately I do not see any signs of active infection at this time systemically and locally I feel like this is dramatically improved. She is doing well  with the [facility] topical antibiotics. 10-02-2022 upon evaluation today patient appears to be doing better in regard to her wound overall. I am very pleased with where things stand from a visual standpoint I do not see any signs of active infection and in general I think that we are moving in the right direction here. 10-09-2022 upon evaluation today patient appears to be doing well currently in regard to her wound. She has been tolerating the dressing changes without complication. Fortunately I do not see any signs of active infection at this time which is great news. 10-16-2022 upon evaluation today patient continues to have quite a bit of pain in regard to her right leg. We have been attempting to debride little by little as we could but again was pretty limited by the fact that she is having significant discomfort. We do not actually have any signs of active infection going on at this point that the [facility] topical antibiotics have been helping quite readily. I been attempting to do as much debridement as I can but if she were to have pain medication this would actually help and in the past when she did it was much more tolerable for her as far as taking the edge off. Right now she has been without as she is in transition from her previous pain management physician to someone new to manage this. Jamie [name], Jamie [name] ([id number]) [id number] Page 3 of 11 10-23-2022 upon evaluation patient appears to be doing excellent in regard to her wound. She has been tolerating the dressing changes without complication. Fortunately I do not see any evidence of active infection locally nor systemically which is great news. No fevers, chills, nausea, vomiting, or diarrhea. 7/24; patient with a wound secondary to chronic venous insufficiency on the right lateral  lower leg. We have been using Keystone antibiotic Hydrofera Blue under kerlix Coban. She complains of a lot of pain after last  week's debridement otherwise things appear to be improved per discussion with our intake nurse. 11-06-2022 upon evaluation today patient appears to be doing excellent at this point in regard to her wound. She has been tolerating the dressing changes without complication and to be honest she is making excellent progress towards complete closure. I am actually very pleased with where we stand I think that she is doing excellent as far as the overall appearance of the wound is concerned as well. She is going require some debridement however. 11-20-2022 upon evaluation today patient appears to be doing well currently in regard to her wound. She has been tolerating the dressing changes without complication. Fortunately there does not appear to be any signs of active infection locally or systemically which is great news and in general I do believe that we are moving in the right direction here. No fevers, chills, nausea, vomiting, or diarrhea. 11-27-2022 upon evaluation today patient appears to be doing a little better in regards to the overall appearance of her wound but unfortunately she is having increased pain. The collagen seems to be getting very dry and stuck to the wound bed which is causing her some issues here. Fortunately I do not see any signs of active infection locally nor systemically at this time. Fortunately I think that her wound is better but unfortunately her pain has not. For that reason I am going to avoid any debridement today since the patient is very upset about the pain and how bad this is hurting at this point. I think we can have to try something a little bit different I am thinking PolyMem may be a good option. 8/28; this patient has difficult venous wounds on the right lateral lower leg and ankle in the setting of chronic venous insufficiency. We have been using polymen under compression with kerlix Coban. 12-11-2022 upon evaluation today patient appears to be doing well currently in  regard to her wound. She has been tolerating the dressing changes without complication and actually appears to be doing much better this week compared to when I saw her 2 weeks ago. The wound is measuring significantly smaller. We are using PolyMem along with Kerlix and Coban. 12-18-2022 upon evaluation today patient appears to be doing well currently in regard to her wound. This is actually showing signs of improvement is measuring a little bit smaller and looking better. Fortunately I do not see any evidence of worsening overall and I believe that we will making good headway with the PolyMem and the [facility] topical antibiotics. 12-25-2022 upon evaluation today patient appears to be doing well currently in regard to her wound. She has been tolerating the dressing changes without complication. Fortunately I do not see any evidence of infection locally or systemically which is great news and in general I do believe that we will make an excellent headway towards complete closure also excellent news. 01-01-2023 upon evaluation patient's wound actually showing signs of improvement the wrap was actually put on properly this week and this is good news. Overall I am extremely happy with where things stand and how this appears today I do not see any signs of worsening overall and I believe that the patient is making excellent headway towards complete closure which is great news. 01/08/2023 upon evaluation today patient appears to be doing well currently in regard to her leg. She is  actually draining much less unfortunately home health is just not doing very well getting the supplies necessary in order to continue to take care of the patient. They are now telling me they cannot get the PolyMem which is something that has been doing really well for her. That really does not make sense to me you came to get PolyMem for a hospice patient. Nonetheless either way I think we may need to just discontinue treatment with  home health and just manage the patient here at the clinic. 01-15-2023 upon evaluation today patient appears to be doing well currently in regard to her wound. She has been tolerating the dressing changes without complication. Fortunately I do not see any signs of infection I think she is doing quite well and very pleased with where we stand today. No fevers, chills, nausea, vomiting, or diarrhea. 01-22-2023 upon evaluation today patient appears to be doing well currently in regard to her wound. She has been tolerating the dressing changes without complication and both wounds are measuring smaller today and look to be doing excellent. I am actually very pleased with what ever standing here and I think that she is making really good headway towards closure which is great news. 01-29-2023 upon evaluation today patient actually tells me she has been having some increased pain. Subsequently it took a little bit of drilling down but in the end I realized that we actually ended up putting a full Unna boot on her last week as opposed to just using a good anchor at the top and bottom. I think this is what wound up with her having increased discomfort because she tells me as she was doing well when she came in last time and by the time she left she tells me this was already getting significantly worse. And she hurt most of the week. With that being said I do believe that the patient is doing quite well at this point with regard to her wounds and the wrap did okay it was not a problem other than the increased pain I think we may benefit from a Urgo K2 light compression wrap as this will be much more comfortable I think compared to what we have been doing with just the Kerlix Coban anyway. 02-05-2023 upon evaluation today patient appears to be doing well currently in regard to her wounds. She has been tolerating the dressing changes without complication. Fortunately I do not see any signs of active infection  looking systemically which is great news. No fevers, chills, nausea, vomiting, or diarrhea. 11/6; patient I remember from a previous stay in this clinic With a left lower extremity wound.. She has a right lower extremity venous wound. She has been using Keystone, polymen under [employer]. Her wound now is to remaining open areas. There is no evidence of infection although she is tender in the area. I wonder whether there is stasis dermatitis here. She reminds me that she had some form of skin graft on this area during his stay at the [facility] wound care center presumably in around 2022. 11/13; difficult wound on the right lateral lower extremity. She has a history of venous wounds in this area and a history of a skin graft in this area. She can planes about a lot of pain but she has not been systemically unwell. No fever chills etc. 11/20; continue difficult wounds on the right lateral lower extremity. She has a history of venous wounds in this area and a history of skin  graft in this area. She continues to complain of pain. She comes in today with completely nonviable surfaces. She has been using [employer] under and Urgo K2 light 12/3; significant improvement in wound dimensions still 2 open wounds which were initially part of the larger single wound. We have been using Keystone polymen under [employer]. The wounds are smaller today. The surface does not look as viable as I might like although she does not tolerate mechanical debridement at all 12/11; 2 open wounds 1 on the right anterior and 1 on the lateral lower leg. We have been using Keystone polymen under Urgo K2 lite's. The wounds are measuring smaller. 12/18; right anterior and right lateral lower leg.. 2 open areas. Somewhat smaller we have been using Keystone polymen under Urgo K2 lite's. The surrounding area is mostly scar tissue from previous skin grafts in this area. Electronic Signature(s) Signed: 03/27/2023 12:11:14 PM By:  Jamie Najjar MD Entered By: Jamie [name] on 03/26/2023 12:32:28 [name] ([id number]) [id number] Page 4 of 11 -------------------------------------------------------------------------------- Physical Exam Details Patient Name: Date of Service: Jamie [name], [name]. 03/26/2023 11:00 A M Medical Record Number: [id number] Patient Account Number: [id number] Date of Birth/Sex: Treating RN: April 10, 1940 (82 y.o. F) Primary Care Provider: Belva [name] Other Clinician: Referring Provider: Treating Provider/Extender: Elby Beck in Treatment: 44 Constitutional Sitting or standing Blood Pressure is within target range for patient.. Pulse regular and within target range for patient.Marland Kitchen Respirations regular, non-labored and within target range.. Temperature is normal and within the target range for the patient.Marland Kitchen Appears in no distress. Cardiovascular Patient looks as though she has skin changes of chronic venous insufficiency. However there is a lot of scar tissue around the wound. Notes Wound exam; 2 open wounds posterior and posterior lateral. She continues to have eschar over these areas but does not want mechanical debridement and when I insist on this she tolerates this incredibly badly. The wounds are not bad looking today perhaps a little smaller. Electronic Signature(s) Signed: 03/27/2023 12:11:14 PM By: Jamie Najjar MD Entered By: Jamie [name] on 03/26/2023 12:36:46 -------------------------------------------------------------------------------- Physician Orders Details Patient Name: Date of Service: Jamie [name], [name] 03/26/2023 11:00 A M Medical Record Number: [id number] Patient Account Number: [id number] Date of Birth/Sex: Treating RN: 01-23-1941 (82 y.o. 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 - Dr. Leanord Hawking Wednesday 04/01/2023 1030 (already scheduled) Return A ppointment in 2 weeks. - Dr. Leanord Hawking Thursday 04/10/2023 1045 (already scheduled) Return A Return appointment in 3 weeks. - *****Leonard Schwartz Wednesday 04/16/2023 (front office to schedule)**** Return appointment in 1 month. Leonard Schwartz Wednesday 04/16/2023 (front office to schedule) Other: - you will need to wear compression stockings for right leg once it heals. Anesthetic Wound #5 Right,Lateral Lower Leg (In clinic) Topical Lidocaine 4% applied to wound bed Bathing/ Shower/ Hygiene May shower and wash wound with soap and water. - with dressing changes. Edema Control - Orders / Instructions Elevate legs to the level of the heart or  above for 30 minutes daily and/or when sitting for 3-4 times a day throughout the day. AADRIKA, JUNGBLUTH ([id number]) [id number] Page 5 of 11 Avoid standing for long periods of time. Exercise regularly - As tolerated Wound Treatment Wound #5 - Lower Leg Wound Laterality: Right, Lateral Cleanser: Soap and Water 1 x Per Week/30 Days Discharge Instructions: May shower and wash wound with dial antibacterial soap and water prior to dressing change. Peri-Wound Care: Sween Lotion (Moisturizing lotion) 1 x Per Week/30 Days Discharge Instructions: Apply moisturizing lotion as directed Topical: Skintegrity Hydrogel 4 (oz) 1 x Per Week/30 Days Discharge Instructions: Apply  hydrogel with sorbact Prim Dressing: PolyMem Silver Non-Adhesive Dressing, 4.25x4.25 in 1 x Per Week/30 Days ary Discharge Instructions: Apply to wound bed as instructed Secondary Dressing: ABD Pad, 8x10 1 x Per Week/30 Days Discharge Instructions: Apply over primary dressing as directed. Secondary Dressing: Woven Gauze Sponge, Non-Sterile 4x4 in 1 x Per Week/30 Days Discharge Instructions: Apply over primary dressing as directed. Compression Wrap: Kerlix Roll 4.5x3.1 (in/yd) 1 x Per Week/30 Days Discharge Instructions: Apply Kerlix and Coban compression as directed. Compression Wrap: Coban Self-Adherent Wrap 4x5 (in/yd) 1 x Per Week/30 Days Discharge Instructions: Apply over Kerlix as directed. Compression Wrap: unna boot FIRST LAYER **** SEE INSTRUCTIONS 1 x Per Week/30 Days Discharge Instructions: APPLY FIRST LAYER UNNA BOOT AT BASES OF TOES AND JUST BELOW THE KNEE TO HOLD COMPRESSION WRAP IN PLACE. Wound #7 - Lower Leg Wound Laterality: Right, Posterior Cleanser: Soap and Water 1 x Per Week/30 Days Discharge Instructions: May shower and wash wound with dial antibacterial soap and water prior to dressing change. Peri-Wound Care: Sween Lotion (Moisturizing lotion) 1 x Per Week/30 Days Discharge Instructions: Apply moisturizing lotion as directed Topical: Skintegrity Hydrogel 4 (oz) 1 x Per Week/30 Days Discharge Instructions: Apply hydrogel with sorbact Prim Dressing: PolyMem Silver Non-Adhesive Dressing, 4.25x4.25 in 1 x Per Week/30 Days ary Discharge Instructions: Apply to wound bed as instructed Secondary Dressing: ABD Pad, 8x10 1 x Per Week/30 Days Discharge Instructions: Apply over primary dressing as directed. Secondary Dressing: Woven Gauze Sponge, Non-Sterile 4x4 in 1 x Per Week/30 Days Discharge Instructions: Apply over primary dressing as directed. Compression Wrap: Kerlix Roll 4.5x3.1 (in/yd) 1 x Per Week/30 Days Discharge Instructions: Apply Kerlix and Coban compression as  directed. Compression Wrap: Coban Self-Adherent Wrap 4x5 (in/yd) 1 x Per Week/30 Days Discharge Instructions: Apply over Kerlix as directed. Compression Wrap: unna boot FIRST LAYER **** SEE INSTRUCTIONS 1 x Per Week/30 Days Discharge Instructions: APPLY FIRST LAYER UNNA BOOT AT BASES OF TOES AND JUST BELOW THE KNEE TO HOLD COMPRESSION WRAP IN PLACE. Electronic Signature(s) Signed: 03/26/2023 5:35:56 PM By: Jamie Stall RN, BSN Signed: 03/27/2023 12:11:14 PM By: Jamie Najjar MD Entered By: Jamie [name] on 03/26/2023 11:58:05 [name] ([id number]) [id number] Page 6 of 11 -------------------------------------------------------------------------------- Problem List Details Patient Name: Date of Service: Jamie [name], [name]. 03/26/2023 11:00 A M Medical Record Number: [id number] Patient Account Number: [id number] Date of Birth/Sex: Treating RN: [birth year] (82 y.o. 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 03/26/2023 11:00 A M Medical Record Number: [id number] Patient Account Number:  [id number] Date of Birth/Sex: Treating RN: 1941/02/08 (82 y.o. F) Primary Care Provider: Belva [name] Other Clinician: Referring Provider: Treating Provider/Extender: Elby Beck in Treatment: 44 Subjective History of Present Illness (HPI) ADMISSION 06/30/2020 Mrs. Debolt is an 82 year old woman who lives in [place]. She is here with her niece for review of wounds on the left medial lower leg and ankle. These have apparently been present for over a year and she followed with Dr. Olegario Messier at the [facility] in [place] for quite a period of time although it looks as though there was a initial consult wound from Dr. Marcha Solders on February 18 presumably there was therefore hiatus. At that point the wounds were described as being there for 3 months. She also tells me she was at the wound care center in [place] for a period of time with this. 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 She had venous studies that were negative for DVT ABIs on the right were 1.01 on the left 1.06. She has . had previous applications of puraply, compression which she does not tolerate very well. She has had several rounds of oral antibiotic therapy. She complains of unrelenting pain and she is seeing Dr. Reece Agar V of pain management apparently was on oxycodone but that did not help. She also had a skin biopsy done by Dr. Olegario Messier although we do not have that result. She does not appear to have an arterial issue. I am not completely clear what she has been putting on the wounds lately. 3/31; this patient has a particularly nasty set of wounds on the left medial ankle in the middle of what looks to be hemosiderin deposition secondary to chronic venous insufficiency. She has a lot of pain followed by pain management. Dr. Marcha Solders apparently did a biopsy of something on the left leg last fall what I would like to get this result. She has a history of MRSA  treatment. I do not believe she had reflux studies but she did have DVT rule out studies. Previous ABIs have not suggested arterial insufficiency 4/7; difficult wounds on the left medial ankle probably chronic venous insufficiency. With considerable effort on behalf of our case manager we were able to KAYLANN, BEHNER ([id number]) [id number] Page 7 of 11 finally to speak to somebody at the hospital in [place] who indicated that no biopsy of this area have been done even though the patient describes this in some detail and is even able to point out where she thinks the biopsy was done. We have been using Sorbact. The PCR culture I did showed polymicrobial identification with Pseudomonas, staph aureus, Peptostreptococcus. All of this and low titers. Resistance genes identified were MRSA, staph virulence gene and tetracycline. We are going to send this to [facility] for a topical antibiotic which is something that we have had good success with recently in large venous ulcers with a lot of purulent drainage. There would not be an easy oral alternative here possibly line escalated and ciprofloxacin if we need to use systemic antibiotic 4/15; difficult area on the left medial ankle. Most likely chronic venous insufficiency. I think she will probably need venous reflux study I think  she had DVT rule outs but not venous reflux studies. We have not yet obtained the topical antibiotics. She has home health changing the dressing we have been using Sorbact for adherent fibrinous debris on the surface. Very difficult to remove 4/22; patient presents for 1 week follow-up. She has been using sore back under compression wraps and these are changed 3 times a week with home health. She also had Keystone antibiotics sent to her house and brought them in today. She has no complaints or issues today. 5/2; patient is here for follow-up. She has been using Sorbact under compression. Very painful  wound. She has been using Keystone antibiotics. Not much improvement although the more medial part of the wound has cleaned up nicely and the larger part of the wound about 50% slough covered. Part of the issue here is that she had stays at both [facility] wound care center, [facility] wound care center and now [place]. Not sure if she has had venous reflux studies. As far as we are able to tell she did not have a biopsy. My notes state that she did not have venous reflux studies just DVT rule outs. 5/16; patient goes for venous reflux studies this afternoon. She says that wound was biopsied which sounds like punch biopsies by Dr. Lynden Ang we do not have these results. We are using Sorbact. Very difficult wound to debride 5/23; patient presents for 1 week follow-up. She reports tolerating the wraps well with sorbact underneath. She had ABIs and venous reflux studies done. She has no issues or complaints today. She denies acute signs of infection. 6/6; I have reviewed the patient's vascular studies. Wounds are on the left medial and posterior calf. She had significant reflux in the greater saphenous vein in the in the mid thigh, distal thigh knee and the small saphenous vein in the popliteal fossa. The vein diameters do not look too impressive though. She is going to see the vascular surgeon on Wednesday. She also had venous reflux in the right common femoral vein. She did not have any evidence of a DVT or SVT I am wondering whether there is an ablation procedure that would benefit her in the greater saphenous vein on the left. . She tells [place] that home health put the dressing on too tight and she took off 1 layer. The swelling in her left leg is a little worse as a result of this. Not much change in the wound measurements so the surface of the wound looks better Her arterial studies showed an ABI on the right of 1.14 with a triphasic waveform and a great toe pressure of 0.89. On the left her ABIs were  noncompressible at 1.34 but with triphasic waveforms and a TBI of 0.97. Her greater toe pressure was 122. There was no evidence of significant bilateral arterial disease 6/20 patient went to see Dr. Durwin Nora. He did not think she had significant arterial disease. In terms of her venous duplex on the right side there was no evidence of a DVT or SVT there was deep venous reflux involving the common femoral vein no superficial vein reflux on the left side there was no DVT or SVT there was no deep vein graft reflux there was some reflux in the greater saphenous vein from the mid thigh to the knee but the vein here was not dilated. He thought these were venous wounds he prescribed a compression pump but I am not sure who we ordered it from The patient has been approved  for Apligraf. Still using silver collagen this week 7/5; Apligraf #1 7/19 Apligraf #2. Decent improvement in the condition of the wound bed. Epithelialization distal 8/2 Apligraf #3. No issues or complaints. Denies signs of infection. 8/17 Apligraf #4. No issues or concerns. Some complaints of pruritus and the rash. Our intake nurse brought up the fact that she had previously indicated possible cotton layer sensitivity we will therefore use kerlix in the bottom layer of the compression 8/30; the patient comes in with the area on her left medial leg just about healed. There is a superficial area more towards the tibia and a smaller open area distally everything else is epithelialized. I do not think she requires another Apligraf 9/13; left medial leg is healed. 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 Her edema control is good Readmisstion: 05-22-2022 upon evaluation today patient presents for initial inspection here in our clinic concerning issues that she has been having with a wound of the right lateral lower extremity. This is an area of a previous skin graft she tells me. With  that being said she unfortunately had a scrape on this that occurred around 10 January. Since that time she has noted that this has just continued to get bigger and bigger in her niece who is present with her today actually states that 2 weeks ago when she saw that it was significantly smaller than what she sees currently. Obviously this is of utmost concern as we do not want this to continue to get larger when arrested and get moving in the right direction. Fortunately there does not appear to be any signs of systemic infection though locally I think we probably do have some infection present. I would obtain a culture to see what we have going on here and then we will subsequently see where things go going forward. Fortunately I think that she is in the right place at this point being at the wound center we can definitely do something to try to get this moving in the right direction. Patient does have a history of chronic venous insufficiency as well as coronary artery disease. In the past she has done well with compression wraps on the start with a 3 layer wrap I think she is probably can end up going to a 4-layer at some point but we will see how things do over the next week. She has been using wound cleanser along with she tells me a zinc and topical but again I am not sure exactly what that was. 05-29-2022 upon evaluation today patient appears to be doing well currently in regard to her wound. This actually is showing signs of improvement I am happy in that regard unfortunately her left leg has an area on the shin that has opened. This is the region where one of the areas at least we have previously taken care of. Nonetheless this is small hoping we get it under control before things worsen significantly. 06-05-2022 upon evaluation today patient appears to be doing well currently in regard to her wounds. The actually seem to be fairly clean she is having still quite a bit of problem with pain at  this point she would like to not have debridement today for all possible. With that being said I do believe that we are making some good progress I think the [facility] is doing a decent job here. 3/6; patient has had 3/6; patient with known severe chronic venous insufficiency.  She apparently had a traumatic wound at home and has a reasonably substantial wound on the right lateral lower leg and a smaller one on the left anterior lower leg as well. We have been using Hydrofera Blue and Unna boots 3/13; patient presents for follow-up. We have been using Hydrofera Blue under Coflex to the legs bilaterally. She has no issues or complaints today. 06-26-2022 upon evaluation today patient appears to be doing well currently in regard to her wound. This is measuring a little bit larger however. Fortunately I do not see any signs of infection this is on the right side. Fortunately the left side is actually completely healed which is great news. 07-03-2022 patient's wound unfortunately is continuing to show signs of being worse. Her compression which was the Tubigrip that I thought should be able to pull up actually slipped down and she was not able to pull that back up. With that being said that means that unfortunately her wound has continued to deteriorate since I last saw her. This is definitely not the direction that we are looking for. I discussed with her that I do believe we need to go ahead and see about getting her started on antibiotics and I subsequently would also like to go ahead and see about getting things moving forward with regard to a wound culture and making a change up her dressings as well but this can be changed more frequently than just once a week. 07-10-2022 upon evaluation today patient actually appears to be doing much better. I do believe that the antibiotics have been beneficial for her. Fortunately I do not see any signs of active infection locally nor systemically which is great  news. No fevers, chills, nausea, vomiting, or diarrhea. 07-17-2022 upon evaluation today patient appears to be doing well currently in regard to her wound which is actually showing signs of improvement this is slow but nonetheless prevalent that we are seeing good improvement here. Fortunately I do not see any signs of active infection locally nor systemically which is great news. Jamie [name], Jamie [name] ([id number]) [id number] Page 8 of 11 07-24-2022 upon evaluation today patient appears to be doing well currently in regard to her wound although the wrap that we had on has been slipping down and she really does not have anybody to help her with this at home health is not going to be coming out we could not get anybody that would actually be able to do the dressing changes. Fortunately I do not see any signs of active infection locally nor systemically which is great news. 08-07-22 poorly in regard to her leg compared to what it was previous. Fortunately there does not appear to be any signs of active infection locally nor systemically which is great news. No fevers, chills, nausea, vomiting, or diarrhea. With that being said it does appear that the patient may have some cellulitis in the leg which is not good. 08-14-2022 upon evaluation today patient appears to be doing somewhat better in regard to her wounds she still is having quite a bit of pain we are still not where we want to be as far as healing is concerned completely. Fortunately I do not see any evidence of active infection locally nor systemically which is great news and I am very pleased in that regard. 08-21-23 upon evaluation today patient appears to be doing poorly currently in regard to her wound. She has been tolerating the dressing changes without complication. Fortunately there does not appear to be any  signs of active infection locally nor systemically at this time. 08-28-2022 upon evaluation today patient  appears to be doing poorly in regard to her wound this was not wrapped appropriately home health did not go up high enough on the wrap. This has caused some issues and I discussed that with the patient today. I do believe that she is going to require aggressive wrapping and treatment which she is getting the [facility] topical antibiotics today they can start using that at the next wrap on Friday. In the meantime they need to make sure that the rapid and appropriately we wrote very specific orders today. 09-04-2022 upon evaluation today patient appears to be doing well currently in regard to her wound which I think is making progress is still hurting her quite significantly. Fortunately I do not see any signs of active infection locally or systemically which is great news. No fevers, chills, nausea, vomiting, or diarrhea. 09-11-2022 upon evaluation today patient's wound actually is showing signs of being a little bit smaller and looking a little bit better she still has a lot going on here however. Fortunately I do not see any evidence of active infection Worsening locally nor systemically which is great news. With that being said worsening still continue to use the topical Keystone antibiotics. 09-18-2022 upon evaluation today patient actually showing some signs of improvement. I am actually very pleased with where we stand compared to where we have been. I think that she is making good headway here. She is still having quite a bit of pain but it seems to be lessening compared to previous. 09-25-2022 upon evaluation patient's wound actually showing signs slowly but surely of improvement. Fortunately I do not see any signs of active infection at this time systemically and locally I feel like this is dramatically improved. She is doing well with the [facility] topical antibiotics. 10-02-2022 upon evaluation today patient appears to be doing better in regard to her wound overall. I am very pleased with where  things stand from a visual standpoint I do not see any signs of active infection and in general I think that we are moving in the right direction here. 10-09-2022 upon evaluation today patient appears to be doing well currently in regard to her wound. She has been tolerating the dressing changes without complication. Fortunately I do not see any signs of active infection at this time which is great news. 10-16-2022 upon evaluation today patient continues to have quite a bit of pain in regard to her right leg. We have been attempting to debride little by little as we could but again was pretty limited by the fact that she is having significant discomfort. We do not actually have any signs of active infection going on at this point that the [facility] topical antibiotics have been helping quite readily. I been attempting to do as much debridement as I can but if she were to have pain medication this would actually help and in the past when she did it was much more tolerable for her as far as taking the edge off. Right now she has been without as she is in transition from her previous pain management physician to someone new to manage this. 10-23-2022 upon evaluation patient appears to be doing excellent in regard to her wound. She has been tolerating the dressing changes without complication. Fortunately I do not see any evidence of active infection locally nor systemically which is great news. No fevers, chills, nausea, vomiting, or diarrhea. 7/24; patient with  a wound secondary to chronic venous insufficiency on the right lateral lower leg. We have been using Keystone antibiotic Hydrofera Blue under kerlix Coban. She complains of a lot of pain after last week's debridement otherwise things appear to be improved per discussion with our intake nurse. 11-06-2022 upon evaluation today patient appears to be doing excellent at this point in regard to her wound. She has been tolerating the dressing changes  without complication and to be honest she is making excellent progress towards complete closure. I am actually very pleased with where we stand I think that she is doing excellent as far as the overall appearance of the wound is concerned as well. She is going require some debridement however. 11-20-2022 upon evaluation today patient appears to be doing well currently in regard to her wound. She has been tolerating the dressing changes without complication. Fortunately there does not appear to be any signs of active infection locally or systemically which is great news and in general I do believe that we are moving in the right direction here. No fevers, chills, nausea, vomiting, or diarrhea. 11-27-2022 upon evaluation today patient appears to be doing a little better in regards to the overall appearance of her wound but unfortunately she is having increased pain. The collagen seems to be getting very dry and stuck to the wound bed which is causing her some issues here. Fortunately I do not see any signs of active infection locally nor systemically at this time. Fortunately I think that her wound is better but unfortunately her pain has not. For that reason I am going to avoid any debridement today since the patient is very upset about the pain and how bad this is hurting at this point. I think we can have to try something a little bit different I am thinking PolyMem may be a good option. 8/28; this patient has difficult venous wounds on the right lateral lower leg and ankle in the setting of chronic venous insufficiency. We have been using polymen under compression with kerlix Coban. 12-11-2022 upon evaluation today patient appears to be doing well currently in regard to her wound. She has been tolerating the dressing changes without complication and actually appears to be doing much better this week compared to when I saw her 2 weeks ago. The wound is measuring significantly smaller. We are using  PolyMem along with Kerlix and Coban. 12-18-2022 upon evaluation today patient appears to be doing well currently in regard to her wound. This is actually showing signs of improvement is measuring a little bit smaller and looking better. Fortunately I do not see any evidence of worsening overall and I believe that we will making good headway with the PolyMem and the [facility] topical antibiotics. 12-25-2022 upon evaluation today patient appears to be doing well currently in regard to her wound. She has been tolerating the dressing changes without complication. Fortunately I do not see any evidence of infection locally or systemically which is great news and in general I do believe that we will make an excellent headway towards complete closure also excellent news. 01-01-2023 upon evaluation patient's wound actually showing signs of improvement the wrap was actually put on properly this week and this is good news. Overall I am extremely happy with where things stand and how this appears today I do not see any signs of worsening overall and I believe that the patient is making excellent headway towards complete closure which is great news. 01/08/2023 upon evaluation today patient appears to  be doing well currently in regard to her leg. She is actually draining much less unfortunately home health is just not doing very well getting the supplies necessary in order to continue to take care of the patient. They are now telling me they cannot get the PolyMem which is something that has been doing really well for her. That really does not make sense to me you came to get PolyMem for a hospice patient. Nonetheless either way I think we may need to just discontinue treatment with home health and just manage the patient here at the clinic. 01-15-2023 upon evaluation today patient appears to be doing well currently in regard to her wound. She has been tolerating the dressing changes without complication. Fortunately I  do not see any signs of infection I think she is doing quite well and very pleased with where we stand today. No fevers, chills, Jamie [name], Jamie [name] ([id number]) [id number] Page 9 of 11 nausea, vomiting, or diarrhea. 01-22-2023 upon evaluation today patient appears to be doing well currently in regard to her wound. She has been tolerating the dressing changes without complication and both wounds are measuring smaller today and look to be doing excellent. I am actually very pleased with what ever standing here and I think that she is making really good headway towards closure which is great news. 01-29-2023 upon evaluation today patient actually tells me she has been having some increased pain. Subsequently it took a little bit of drilling down but in the end I realized that we actually ended up putting a full Unna boot on her last week as opposed to just using a good anchor at the top and bottom. I think this is what wound up with her having increased discomfort because she tells me as she was doing well when she came in last time and by the time she left she tells me this was already getting significantly worse. And she hurt most of the week. With that being said I do believe that the patient is doing quite well at this point with regard to her wounds and the wrap did okay it was not a problem other than the increased pain I think we may benefit from a Urgo K2 light compression wrap as this will be much more comfortable I think compared to what we have been doing with just the Kerlix Coban anyway. 02-05-2023 upon evaluation today patient appears to be doing well currently in regard to her wounds. She has been tolerating the dressing changes without complication. Fortunately I do not see any signs of active infection looking systemically which is great news. No fevers, chills, nausea, vomiting, or diarrhea. 11/6; patient I remember from a previous stay in this clinic With a  left lower extremity wound.. She has a right lower extremity venous wound. She has been using Keystone, polymen under [employer]. Her wound now is to remaining open areas. There is no evidence of infection although she is tender in the area. I wonder whether there is stasis dermatitis here. She reminds me that she had some form of skin graft on this area during his stay at the [facility] wound care center presumably in around 2022. 11/13; difficult wound on the right lateral lower extremity. She has a history of venous wounds in this area and a history of a skin graft in this area. She can planes about a lot of pain but she has not been systemically unwell. No fever chills etc. 11/20; continue difficult wounds on  the right lateral lower extremity. She has a history of venous wounds in this area and a history of skin graft in this area. She continues to complain of pain. She comes in today with completely nonviable surfaces. She has been using [employer] under and Urgo K2 light 12/3; significant improvement in wound dimensions still 2 open wounds which were initially part of the larger single wound. We have been using Keystone polymen under [employer]. The wounds are smaller today. The surface does not look as viable as I might like although she does not tolerate mechanical debridement at all 12/11; 2 open wounds 1 on the right anterior and 1 on the lateral lower leg. We have been using Keystone polymen under Urgo K2 lite's. The wounds are measuring smaller. 12/18; right anterior and right lateral lower leg.. 2 open areas. Somewhat smaller we have been using Keystone polymen under Urgo K2 lite's. The surrounding area is mostly scar tissue from previous skin grafts in this area. Objective Constitutional Sitting or standing Blood Pressure is within target range for patient.. Pulse regular and within target range for patient.Marland Kitchen Respirations regular, non-labored and within target range.. Temperature  is normal and within the target range for the patient.Marland Kitchen Appears in no distress. Vitals Time Taken: 11:25 AM, Height: 62 in, Weight: 171 lbs, BMI: 31.3, Temperature: 98.1 F, Pulse: 60 bpm, Respiratory Rate: 20 breaths/min, Blood Pressure: 104/57 mmHg. Cardiovascular Patient looks as though she has skin changes of chronic venous insufficiency. However there is a lot of scar tissue around the wound. General Notes: Wound exam; 2 open wounds posterior and posterior lateral. She continues to have eschar over these areas but does not want mechanical debridement and when I insist on this she tolerates this incredibly badly. The wounds are not bad looking today perhaps a little smaller. Integumentary (Hair, Skin) Wound #5 status is Open. Original cause of wound was Trauma. The date acquired was: 04/17/2022. The wound has been in treatment 44 weeks. The wound is located on the Right,Lateral Lower Leg. The wound measures 1cm length x 0.4cm width x 0.1cm depth; 0.314cm^2 area and 0.031cm^3 volume. There is Fat Layer (Subcutaneous Tissue) exposed. There is no tunneling or undermining noted. There is a medium amount of serosanguineous drainage noted. The wound margin is thickened. There is large (67-100%) red, pink granulation within the wound bed. There is a small (1-33%) amount of necrotic tissue within the wound bed including Adherent Slough. The periwound skin appearance exhibited: Scarring, Hemosiderin Staining. The periwound skin appearance did not exhibit: Callus, Crepitus, Excoriation, Induration, Rash, Dry/Scaly, Maceration, Atrophie Blanche, Cyanosis, Ecchymosis, Mottled, Pallor, Rubor, Erythema. Periwound temperature was noted as No Abnormality. The periwound has tenderness on palpation. Wound #7 status is Open. Original cause of wound was Gradually Appeared. The date acquired was: 01/01/2023. The wound has been in treatment 12 weeks. The wound is located on the Right,Posterior Lower Leg. The wound  measures 3cm length x 2cm width x 0.1cm depth; 4.712cm^2 area and 0.471cm^3 volume. There is Fat Layer (Subcutaneous Tissue) exposed. There is no tunneling or undermining noted. There is a medium amount of serosanguineous drainage noted. The wound margin is thickened. There is large (67-100%) red, pink granulation within the wound bed. There is a small (1-33%) amount of necrotic tissue within the wound bed including Adherent Slough. The periwound skin appearance exhibited: Dry/Scaly. The periwound skin appearance did not exhibit: Callus, Crepitus, Excoriation, Induration, Rash, Scarring, Maceration, Atrophie Blanche, Cyanosis, Ecchymosis, Hemosiderin Staining, Mottled, Pallor, Rubor, Erythema. Periwound  temperature was noted as No Abnormality. The periwound has tenderness on palpation. 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 - Dr. Leanord Hawking Wednesday 04/01/2023 1030 (already scheduled) Return Appointment in 2 weeks. - Dr. Leanord Hawking Thursday 04/10/2023 1045 (already scheduled) Return appointment in 3 weeks. - *****Leonard Schwartz Wednesday 04/16/2023 (front office to schedule)**** Return appointment in 1 month. Leonard Schwartz Wednesday 04/16/2023 (front office to schedule) Other: - you will need to wear compression stockings for right leg once it heals. Anesthetic: Wound #5 Right,Lateral Lower Leg: (In clinic) Topical Lidocaine 4% applied to wound bed Bathing/ Shower/ Hygiene: May shower and wash wound with soap and water. - with dressing changes. Edema Control - Orders / Instructions: Elevate legs to the level of the heart or above  for 30 minutes daily and/or when sitting for 3-4 times a day throughout the day. Avoid standing for long periods of time. Exercise regularly - As tolerated WOUND #5: - Lower Leg Wound Laterality: Right, Lateral Cleanser: Soap and Water 1 x Per Week/30 Days Discharge Instructions: May shower and wash wound with dial antibacterial soap and water prior to dressing change. Peri-Wound Care: Sween Lotion (Moisturizing lotion) 1 x Per Week/30 Days Discharge Instructions: Apply moisturizing lotion as directed Topical: Skintegrity Hydrogel 4 (oz) 1 x Per Week/30 Days Discharge Instructions: Apply hydrogel with sorbact Prim Dressing: PolyMem Silver Non-Adhesive Dressing, 4.25x4.25 in 1 x Per Week/30 Days ary Discharge Instructions: Apply to wound bed as instructed Secondary Dressing: ABD Pad, 8x10 1 x Per Week/30 Days Discharge Instructions: Apply over primary dressing as directed. Secondary Dressing: Woven Gauze Sponge, Non-Sterile 4x4 in 1 x Per Week/30 Days Discharge Instructions: Apply over primary dressing as directed. Com pression Wrap: Kerlix Roll 4.5x3.1 (in/yd) 1 x Per Week/30 Days Discharge Instructions: Apply Kerlix and Coban compression as directed. Com pression Wrap: Coban Self-Adherent Wrap 4x5 (in/yd) 1 x Per Week/30 Days Discharge Instructions: Apply over Kerlix as directed. Com pression Wrap: unna boot FIRST LAYER **** SEE INSTRUCTIONS 1 x Per Week/30 Days Discharge Instructions: APPLY FIRST LAYER UNNA BOOT AT BASES OF TOES AND JUST BELOW THE KNEE TO HOLD COMPRESSION WRAP IN PLACE. WOUND #7: - Lower Leg Wound Laterality: Right, Posterior Cleanser: Soap and Water 1 x Per Week/30 Days Discharge Instructions: May shower and wash wound with dial antibacterial soap and water prior to dressing change. Peri-Wound Care: Sween Lotion (Moisturizing lotion) 1 x Per Week/30 Days Discharge Instructions: Apply moisturizing lotion as directed Topical: Skintegrity Hydrogel 4 (oz) 1 x Per Week/30  Days Discharge Instructions: Apply hydrogel with sorbact Prim Dressing: PolyMem Silver Non-Adhesive Dressing, 4.25x4.25 in 1 x Per Week/30 Days ary Discharge Instructions: Apply to wound bed as instructed Secondary Dressing: ABD Pad, 8x10 1 x Per Week/30 Days Discharge Instructions: Apply over primary dressing as directed. Secondary Dressing: Woven Gauze Sponge, Non-Sterile 4x4 in 1 x Per Week/30 Days Discharge Instructions: Apply over primary dressing as directed. Com pression Wrap: Kerlix Roll 4.5x3.1 (in/yd) 1 x Per Week/30 Days Discharge Instructions: Apply Kerlix and Coban compression as directed. Com pression Wrap: Coban Self-Adherent Wrap 4x5 (in/yd) 1 x Per Week/30 Days Discharge Instructions: Apply over Kerlix as directed. Com pression Wrap: unna boot FIRST LAYER **** SEE INSTRUCTIONS 1 x Per Week/30  Days Discharge Instructions: APPLY FIRST LAYER UNNA BOOT AT BASES OF TOES AND JUST BELOW THE KNEE TO HOLD COMPRESSION WRAP IN PLACE. 1. I change the primary dressing to polymen Ag. ABD, still under kerlix Coban 2. She tolerates mechanical debridement exceptionally poorly no matter what we do for topical anesthesia. Electronic Signature(s) Signed: 03/27/2023 12:11:14 PM By: Jamie Najjar MD Entered By: Jamie [name] on 03/26/2023 12:37:54 -------------------------------------------------------------------------------- SuperBill Details Patient Name: Date of Service: Jamie [name], Christean Grief. 03/26/2023 Medical Record Number: [id number] Patient Account Number: [id number] LOREA, BRESTER ([id number]) [id number] Page 11 of 11 Date of Birth/Sex: Treating RN: [birth year] (82 y.o. Jamie [name], Jamie [name] Primary Care Provider: Belva [name] Other Clinician: Referring Provider: Treating Provider/Extender: Elby Beck in Treatment: 44 Diagnosis Coding ICD-10 Codes Code Description [phone] Chronic venous hypertension (idiopathic) with  ulcer and inflammation of right lower extremity 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d I25.10 Atherosclerotic heart disease of native coronary artery without angina pectoris Facility Procedures : CPT4 Code: [id number] Description: 99213 - WOUND CARE VISIT-LEV 3 EST PT Modifier: Quantity: 1 Physician Procedures : CPT4 Code Description Modifier [id number] - WC PHYS LEVEL 3 - EST PT ICD-10 Diagnosis Description I87.331 Chronic venous hypertension (idiopathic) with ulcer and inflammation of right lower extremity L97.812 Non-pressure chronic ulcer of other part  of right lower leg with fat layer exposed Quantity: 1 Electronic Signature(s) Signed: 03/27/2023 12:11:14 PM By: Jamie Najjar MD Entered By: Jamie [name] on 03/26/2023 12:38:47

## 2023-04-01 ENCOUNTER — Encounter (HOSPITAL_BASED_OUTPATIENT_CLINIC_OR_DEPARTMENT_OTHER): Payer: Medicare HMO | Admitting: Internal Medicine

## 2023-04-01 DIAGNOSIS — I87331 Chronic venous hypertension (idiopathic) with ulcer and inflammation of right lower extremity: Secondary | ICD-10-CM | POA: Diagnosis not present

## 2023-04-01 NOTE — Progress Notes (Signed)
MAKENZY, LATHAM ([id number]) [id number] Page 1 of 10 Visit Report for 04/01/2023 Arrival Information Details Patient Name: Date of Service: Jamie [name], [name]. 04/01/2023 10:30 A M Medical Record Number: [id number] Patient Account Number: [id number] Date of Birth/Sex: Treating RN: 1940-05-17 (82 y.o. F) Primary Care Meeya Goldin: Belva Agee Other Clinician: Referring Keiry Kowal: Treating Jakya Dovidio/Extender: Elby Beck in Treatment: 44 Visit Information History Since Last Visit Added or deleted any medications: No Patient Arrived: Wheel Chair Any new allergies or adverse reactions: No Arrival Time: 11:06 Had a fall or experienced change in No Accompanied By: niece activities of daily living that may affect Transfer Assistance: None risk of falls: Patient Identification Verified: Yes Signs or symptoms of abuse/neglect since last visito No Secondary Verification Process Completed: Yes Hospitalized since last visit: No Patient Requires Transmission-Based Precautions: No Implantable device outside of the clinic excluding No Patient Has Alerts: No cellular tissue based products placed in the center since last visit: Pain Present Now: No Electronic Signature(s) Signed: 04/01/2023 11:17:23 AM By: Dayton Scrape Entered By: Dayton Scrape on 04/01/2023 08:06:39 -------------------------------------------------------------------------------- Clinic Level of Care Assessment Details Patient Name: Date of Service: Jamie [name], [name]. 04/01/2023 10:30 A M Medical Record Number: [id number] Patient Account Number: [id number] Date of Birth/Sex: Treating RN: [birth year] (82 y.o. Orville Govern Primary Care Venkat Ankney: Belva Agee Other Clinician: Referring Rosezetta Balderston: Treating Facundo Allemand/Extender: Elby Beck in Treatment: 44 Clinic Level of Care Assessment Items TOOL 4 Quantity Score X- 1 0 Use when only an EandM is  performed on FOLLOW-UP visit ASSESSMENTS - Nursing Assessment / Reassessment X- 1 10 Reassessment of Co-morbidities (includes updates in patient status) X- 1 5 Reassessment of Adherence to Treatment Plan ASSESSMENTS - Wound and Skin A ssessment / Reassessment X - Simple Wound Assessment / Reassessment - one wound 1 5 []  - 0 Complex Wound Assessment / Reassessment - multiple wounds []  - 0 Dermatologic / Skin Assessment (not related to wound area) ASSESSMENTS - Focused Assessment X- 1 5 Circumferential Edema Measurements - multi extremities []  - 0 Nutritional Assessment / Counseling / Intervention []  - 0 Lower Extremity Assessment (monofilament, tuning fork, pulses) SYNDEY, MERENDINO ([id number]) [phone].pdf Page 2 of 10 []  - 0 Peripheral Arterial Disease Assessment (using hand held doppler) ASSESSMENTS - Ostomy and/or Continence Assessment and Care []  - 0 Incontinence Assessment and Management []  - 0 Ostomy Care Assessment and Management (repouching, etc.) PROCESS - Coordination of Care X - Simple Patient / Family Education for ongoing care 1 15 []  - 0 Complex (extensive) Patient / Family Education for ongoing care X- 1 10 Staff obtains Chiropractor, Records, T Results / Process Orders est []  - 0 Staff telephones HHA, Nursing Homes / Clarify orders / etc []  - 0 Routine Transfer to another Facility (non-emergent condition) []  - 0 Routine Hospital Admission (non-emergent condition) []  - 0 New Admissions / Manufacturing engineer / Ordering NPWT Apligraf, etc. , []  - 0 Emergency Hospital Admission (emergent condition) []  - 0 Simple Discharge Coordination []  - 0 Complex (extensive) Discharge Coordination PROCESS - Special Needs []  - 0 Pediatric / Minor Patient Management []  - 0 Isolation Patient Management []  - 0 Hearing / Language / Visual special needs []  - 0 Assessment of Community assistance (transportation, D/C planning, etc.) []  -  0 Additional assistance / Altered mentation []  - 0 Support Surface(s) Assessment (bed, cushion, seat, etc.) INTERVENTIONS - Wound Cleansing / Measurement []  - 0 Simple Wound Cleansing - one wound X- 2  5 Complex Wound Cleansing - multiple wounds X- 1 5 Wound Imaging (photographs - any number of wounds) []  - 0 Wound Tracing (instead of photographs) []  - 0 Simple Wound Measurement - one wound X- 2 5 Complex Wound Measurement - multiple wounds INTERVENTIONS - Wound Dressings []  - 0 Small Wound Dressing one or multiple wounds []  - 0 Medium Wound Dressing one or multiple wounds X- 1 20 Large Wound Dressing one or multiple wounds []  - 0 Application of Medications - topical []  - 0 Application of Medications - injection INTERVENTIONS - Miscellaneous []  - 0 External ear exam []  - 0 Specimen Collection (cultures, biopsies, blood, body fluids, etc.) []  - 0 Specimen(s) / Culture(s) sent or taken to Lab for analysis []  - 0 Patient Transfer (multiple staff / Nurse, adult / Similar devices) []  - 0 Simple Staple / Suture removal (25 or less) []  - 0 Complex Staple / Suture removal (26 or more) []  - 0 Hypo / Hyperglycemic Management (close monitor of Blood Glucose) []  - 0 Ankle / Brachial Index (ABI) - do not check if billed separately KHAIRA, IHDE ([id number]) [id number].pdf Page 3 of 10 X- 1 5 Vital Signs Has the patient been seen at the hospital within the last three years: Yes Total Score: 100 Level Of Care: New/Established - Level 3 Electronic Signature(s) Signed: 04/01/2023 12:06:25 PM By: Redmond Pulling RN, BSN Entered By: Redmond Pulling on 04/01/2023 08:24:34 -------------------------------------------------------------------------------- Encounter Discharge Information Details Patient Name: Date of Service: Jamie [name], [name] 04/01/2023 10:30 A M Medical Record Number: [id number] Patient Account Number: [id number] Date of Birth/Sex:  Treating RN: 02/07/1941 (82 y.o. Orville Govern Primary Care Hattye Siegfried: Belva Agee Other Clinician: Referring Ziana Heyliger: Treating Sia Gabrielsen/Extender: Elby Beck in Treatment: 44 Encounter Discharge Information Items Discharge Condition: Stable Ambulatory Status: Wheelchair Discharge Destination: Home Transportation: Private Auto Accompanied By: daughter Schedule Follow-up Appointment: Yes Clinical Summary of Care: Patient Declined Electronic Signature(s) Signed: 04/01/2023 12:06:25 PM By: Redmond Pulling RN, BSN Entered By: Redmond Pulling on 04/01/2023 08:25:17 -------------------------------------------------------------------------------- Lower Extremity Assessment Details Patient Name: Date of Service: Gevena Mart W. 04/01/2023 10:30 A M Medical Record Number: [id number] Patient Account Number: [id number] Date of Birth/Sex: Treating RN: Mar 25, 1941 (82 y.o. Orville Govern Primary Care Odilon Cass: Belva Agee Other Clinician: Referring Cederic Mozley: Treating Ozias Dicenzo/Extender: Elby Beck in Treatment: 44 Edema Assessment Assessed: [name]: No] Franne Forts: No] Edema: [Left: N] [Right: o] Calf Left: Right: Point of Measurement: From Medial Instep 35 cm Ankle Left: Right: Point of Measurement: From Medial Instep 24 cm Vascular Assessment Pulses: SAUNDRA, VICINI ([id number]) [Right:[id number] Page 4 of 10] Dorsalis Pedis Palpable: [Right:Yes] Extremity colors, hair growth, and conditions: Extremity Color: [Right:Normal] Hair Growth on Extremity: [Right:No] Temperature of Extremity: [Right:Warm] Capillary Refill: [Right:< 3 seconds] Dependent Rubor: [Right:No No] Electronic Signature(s) Signed: 04/01/2023 12:06:25 PM By: Redmond Pulling RN, BSN Entered By: Redmond Pulling on 04/01/2023 93:23:55 -------------------------------------------------------------------------------- Multi Wound Chart  Details Patient Name: Date of Service: Jamie [name], [name] 04/01/2023 10:30 A M Medical Record Number: [id number] Patient Account Number: [id number] Date of Birth/Sex: Treating RN: 06-Sep-1940 (82 y.o. F) Primary Care Phebe Dettmer: Belva Agee Other Clinician: Referring Humna Moorehouse: Treating Obryan Radu/Extender: Elby Beck in Treatment: 44 Vital Signs Height(in): 62 Pulse(bpm): 60 Weight(lbs): 171 Blood Pressure(mmHg): 118/70 Body Mass Index(BMI): 31.3 Temperature(F): 98.5 Respiratory Rate(breaths/min): 18 [5:Photos:] [N/A:N/A] Right, Lateral Lower Leg Right, Posterior Lower Leg N/A Wound Location: Trauma Gradually Appeared N/A Wounding Event: Venous Leg Ulcer  Venous Leg Ulcer N/A Primary Etiology: Sleep Apnea, Hypertension, Peripheral Sleep Apnea, Hypertension, Peripheral N/A Comorbid History: Venous Disease, Osteoarthritis, Venous Disease, Osteoarthritis, Neuropathy Neuropathy 04/17/2022 01/01/2023 N/A Date Acquired: 23 12 N/A Weeks of Treatment: Open Open N/A Wound Status: No No N/A Wound Recurrence: Yes No N/A Clustered Wound: 0.9x0.4x0.1 3x1.5x0.1 N/A Measurements L x W x D (cm) 0.283 3.534 N/A A (cm) : rea 0.028 0.353 N/A Volume (cm) : 98.90% 69.90% N/A % Reduction in Area: 99.60% 85.00% N/A % Reduction in Volume: Full Thickness Without Exposed Full Thickness Without Exposed N/A Classification: Support Structures Support Structures Medium Medium N/A Exudate Amount: Serosanguineous Serosanguineous N/A Exudate Type: red, brown red, brown N/A Exudate Color: Thickened Thickened N/A Wound Margin: Large (67-100%) Large (67-100%) N/A Granulation Amount: Red, Pink Red, Pink N/A Granulation Quality: Small (1-33%) Small (1-33%) N/A Necrotic Amount: Fat Layer (Subcutaneous Tissue): Yes Fat Layer (Subcutaneous Tissue): Yes N/A Exposed Structures: Fascia: No Fascia: No Tendon: No Tendon: No CLARITZA, SCHNARS ([id number])  [id number] Page 5 of 10 Muscle: No Muscle: No Joint: No Joint: No Bone: No Bone: No Large (67-100%) Medium (34-66%) N/A Epithelialization: Scarring: Yes Excoriation: No N/A Periwound Skin Texture: Excoriation: No Induration: No Induration: No Callus: No Callus: No Crepitus: No Crepitus: No Rash: No Rash: No Scarring: No Maceration: No Dry/Scaly: Yes N/A Periwound Skin Moisture: Dry/Scaly: No Maceration: No Hemosiderin Staining: Yes Atrophie Blanche: No N/A Periwound Skin Color: Atrophie Blanche: No Cyanosis: No Cyanosis: No Ecchymosis: No Ecchymosis: No Erythema: No Erythema: No Hemosiderin Staining: No Mottled: No Mottled: No Pallor: No Pallor: No Rubor: No Rubor: No No Abnormality No Abnormality N/A Temperature: Yes Yes N/A Tenderness on Palpation: Treatment Notes Electronic Signature(s) Signed: 04/01/2023 11:51:18 AM By: [name] MD Entered By: [name] on 04/01/2023 08:22:06 -------------------------------------------------------------------------------- Multi-Disciplinary Care Plan Details Patient Name: Date of Service: Jamie Brink, MA [name] 04/01/2023 10:30 A M Medical Record Number: [id number] Patient Account Number: [id number] Date of Birth/Sex: Treating RN: 01/24/1941 (82 y.o. Orville Govern Primary Care Stephenie Navejas: Belva Agee Other Clinician: Referring Braelon Sprung: Treating Blayre Papania/Extender: Elby Beck in Treatment: 44 Multidisciplinary Care Plan reviewed with physician Active Inactive Pain, Acute or Chronic Nursing Diagnoses: Pain Management - Cyclic Acute (Dressing Change Related) Pain Management - Non-cyclic Acute (Procedural) Pain, acute or chronic: actual or potential Goals: Patient will verbalize adequate pain control and receive pain control interventions during procedures as needed Date Initiated: 09/11/2022 Target Resolution Date: 05/09/2023 Goal Status:  Active Patient/caregiver will verbalize adequate pain control between visits Date Initiated: 09/11/2022 Target Resolution Date: 05/09/2023 Goal Status: Active Patient/caregiver will verbalize comfort level met Date Initiated: 09/11/2022 Target Resolution Date: 05/09/2023 Goal Status: Active Interventions: Complete pain assessment as per visit requirements Encourage patient to take pain medications as prescribed Provide education on pain management Provision of support: recognize patient pain, provide comfort and support as needed Reposition patient for comfort Treatment Activities: MIGUEL, MARCOS ([id number]) [phone].pdf Page 6 of 10 Administer pain control measures as ordered : 09/11/2022 Notes: Electronic Signature(s) Signed: 04/01/2023 12:06:25 PM By: Redmond Pulling RN, BSN Entered By: Redmond Pulling on 04/01/2023 08:14:33 -------------------------------------------------------------------------------- Pain Assessment Details Patient Name: Date of Service: Jamie [name], [name] 04/01/2023 10:30 A M Medical Record Number: [id number] Patient Account Number: [id number] Date of Birth/Sex: Treating RN: 02/04/1941 (82 y.o. F) Primary Care Zyia Kaneko: Belva Agee Other Clinician: Referring Dwan Fennel: Treating Maelee Hoot/Extender: Elby Beck in Treatment: 44 Active Problems Location of Pain Severity and Description of Pain Patient Has Paino  Yes Site Locations Rate the pain. Current Pain Level: 4 Worst Pain Level: 10 Least Pain Level: 0 Tolerable Pain Level: 2 Pain Management and Medication Current Pain Management: Electronic Signature(s) Signed: 04/01/2023 11:17:23 AM By: Dayton Scrape Entered By: Dayton Scrape on 04/01/2023 08:07:18 -------------------------------------------------------------------------------- Patient/Caregiver Education Details Patient Name: Date of Service: [name] 12/24/2024andnbsp10:30 A  M Medical Record Number: [id number] Patient Account Number: [id number] Date of Birth/Gender: Treating RN: Sep 10, 1940 (82 y.o. Orville Govern Primary Care Physician: Belva Agee Other Clinician: Referring Physician: Treating Physician/Extender: Elby Beck in Treatment: [address] JLEIGH, DISQUE ([id number]) [id number] Page 7 of 10 Education Provided To: Patient Education Topics Provided Wound/Skin Impairment: Methods: Explain/Verbal Responses: State content correctly Electronic Signature(s) Signed: 04/01/2023 12:06:25 PM By: Redmond Pulling RN, BSN Entered By: Redmond Pulling on 04/01/2023 08:14:48 -------------------------------------------------------------------------------- Wound Assessment Details Patient Name: Date of Service: Jamie [name], [name] 04/01/2023 10:30 A M Medical Record Number: [id number] Patient Account Number: [id number] Date of Birth/Sex: Treating RN: 19-Jun-1940 (82 y.o. F) Primary Care Kylene Zamarron: Belva Agee Other Clinician: Referring Rashad Auld: Treating Rathana Viveros/Extender: Elby Beck in Treatment: 44 Wound Status Wound Number: 5 Primary Venous Leg Ulcer Etiology: Wound Location: Right, Lateral Lower Leg Wound Open Wounding Event: Trauma Status: Date Acquired: 04/17/2022 Comorbid Sleep Apnea, Hypertension, Peripheral Venous Disease, Weeks Of Treatment: 44 History: Osteoarthritis, Neuropathy Clustered Wound: Yes Photos Wound Measurements Length: (cm) 0.9 Width: (cm) 0.4 Depth: (cm) 0.1 Area: (cm) 0.283 Volume: (cm) 0.028 % Reduction in Area: 98.9% % Reduction in Volume: 99.6% Epithelialization: Large (67-100%) Wound Description Classification: Full Thickness Without Exposed Support Wound Margin: Thickened Exudate Amount: Medium Exudate Type: Serosanguineous Exudate Color: red, brown Structures Foul Odor After Cleansing: No Slough/Fibrino Yes Wound  Bed Granulation Amount: Large (67-100%) Exposed Structure Granulation Quality: Red, Pink Fascia Exposed: No Necrotic Amount: Small (1-33%) Fat Layer (Subcutaneous Tissue) Exposed: Yes Necrotic Quality: Adherent Slough Tendon Exposed: No TAMANTHA, SEDBERRY ([id number]) [id number] Page 8 of 10 Muscle Exposed: No Joint Exposed: No Bone Exposed: No Periwound Skin Texture Texture Color No Abnormalities Noted: No No Abnormalities Noted: No Callus: No Atrophie Blanche: No Crepitus: No Cyanosis: No Excoriation: No Ecchymosis: No Induration: No Erythema: No Rash: No Hemosiderin Staining: Yes Scarring: Yes Mottled: No Pallor: No Moisture Rubor: No No Abnormalities Noted: No Dry / Scaly: No Temperature / Pain Maceration: No Temperature: No Abnormality Tenderness on Palpation: Yes Treatment Notes Wound #5 (Lower Leg) Wound Laterality: Right, Lateral Cleanser Soap and Water Discharge Instruction: May shower and wash wound with dial antibacterial soap and water prior to dressing change. Peri-Wound Care Sween Lotion (Moisturizing lotion) Discharge Instruction: Apply moisturizing lotion as directed Topical Skintegrity Hydrogel 4 (oz) Discharge Instruction: Apply hydrogel with sorbact Primary Dressing PolyMem Silver Non-Adhesive Dressing, 4.25x4.25 in Discharge Instruction: Apply to wound bed as instructed Secondary Dressing ABD Pad, 8x10 Discharge Instruction: Apply over primary dressing as directed. Woven Gauze Sponge, Non-Sterile 4x4 in Discharge Instruction: Apply over primary dressing as directed. Secured With Compression Wrap Kerlix Roll 4.5x3.1 (in/yd) Discharge Instruction: Apply Kerlix and Coban compression as directed. Coban Self-Adherent Wrap 4x5 (in/yd) Discharge Instruction: Apply over Kerlix as directed. unna boot FIRST LAYER **** SEE INSTRUCTIONS Discharge Instruction: APPLY FIRST LAYER UNNA BOOT AT BASES OF TOES AND JUST BELOW  THE KNEE TO HOLD COMPRESSION WRAP IN PLACE. Compression Stockings Add-Ons Electronic Signature(s) Signed: 04/01/2023 11:17:23 AM By: Dayton Scrape Entered By: Dayton Scrape on 04/01/2023 08:08:53 Wound Assessment Details -------------------------------------------------------------------------------- [name] ([id number])  [id number] Page 9 of 10 Patient Name: Date of Service: Jamie [name], [name]. 04/01/2023 10:30 A M Medical Record Number: [id number] Patient Account Number: [id number] Date of Birth/Sex: Treating RN: 1940-12-21 (82 y.o. F) Primary Care Cace Osorto: Belva Agee Other Clinician: Referring Sederick Jacobsen: Treating Edita Weyenberg/Extender: Elby Beck in Treatment: 44 Wound Status Wound Number: 7 Primary Venous Leg Ulcer Etiology: Wound Location: Right, Posterior Lower Leg Wound Open Wounding Event: Gradually Appeared Status: Date Acquired: 01/01/2023 Comorbid Sleep Apnea, Hypertension, Peripheral Venous Disease, Weeks Of Treatment: 12 History: Osteoarthritis, Neuropathy Clustered Wound: No Photos Wound Measurements Length: (cm) 3 % Reduction in Area: 69.9% Width: (cm) 1.5 % Reduction in Volume: 85% Depth: (cm) 0.1 Epithelialization: Medium (34-66%) Area: (cm) 3.534 Volume: (cm) 0.353 Wound Description Classification: Full Thickness Without Exposed Support Structures Foul Odor After Cleansing: No Wound Margin: Thickened Slough/Fibrino Yes Exudate Amount: Medium Exudate Type: Serosanguineous Exudate Color: red, brown Wound Bed Granulation Amount: Large (67-100%) Exposed Structure Granulation Quality: Red, Pink Fascia Exposed: No Necrotic Amount: Small (1-33%) Fat Layer (Subcutaneous Tissue) Exposed: Yes Necrotic Quality: Adherent Slough Tendon Exposed: No Muscle Exposed: No Joint Exposed: No Bone Exposed: No Periwound Skin Texture Texture Color No Abnormalities Noted: No No Abnormalities Noted:  No Callus: No Atrophie Blanche: No Crepitus: No Cyanosis: No Excoriation: No Ecchymosis: No Induration: No Erythema: No Rash: No Hemosiderin Staining: No Scarring: No Mottled: No Pallor: No Moisture Rubor: No No Abnormalities Noted: No Dry / Scaly: Yes Temperature / Pain Maceration: No Temperature: No Abnormality Tenderness on Palpation: Yes Treatment Notes Wound #7 (Lower Leg) Wound Laterality: Right, Posterior Cleanser Soap and [address] VALDA, HAGIN ([id number]) [phone].pdf Page 10 of 10 Discharge Instruction: May shower and wash wound with dial antibacterial soap and water prior to dressing change. Peri-Wound Care Sween Lotion (Moisturizing lotion) Discharge Instruction: Apply moisturizing lotion as directed Topical Skintegrity Hydrogel 4 (oz) Discharge Instruction: Apply hydrogel with sorbact Primary Dressing PolyMem Silver Non-Adhesive Dressing, 4.25x4.25 in Discharge Instruction: Apply to wound bed as instructed Secondary Dressing ABD Pad, 8x10 Discharge Instruction: Apply over primary dressing as directed. Woven Gauze Sponge, Non-Sterile 4x4 in Discharge Instruction: Apply over primary dressing as directed. Secured With Compression Wrap Kerlix Roll 4.5x3.1 (in/yd) Discharge Instruction: Apply Kerlix and Coban compression as directed. Coban Self-Adherent Wrap 4x5 (in/yd) Discharge Instruction: Apply over Kerlix as directed. unna boot FIRST LAYER **** SEE INSTRUCTIONS Discharge Instruction: APPLY FIRST LAYER UNNA BOOT AT BASES OF TOES AND JUST BELOW THE KNEE TO HOLD COMPRESSION WRAP IN PLACE. Compression Stockings Add-Ons Electronic Signature(s) Signed: 04/01/2023 11:17:23 AM By: Dayton Scrape Entered By: Dayton Scrape on 04/01/2023 08:09:39 -------------------------------------------------------------------------------- Vitals Details Patient Name: Date of Service: Jamie Brink, MA [name] 04/01/2023 10:30 A M Medical Record  Number: [id number] Patient Account Number: [id number] Date of Birth/Sex: Treating RN: 1940/06/27 (82 y.o. F) Primary Care Cherylanne Ardelean: Belva Agee Other Clinician: Referring Kristal Perl: Treating Lanaysia Fritchman/Extender: Elby Beck in Treatment: 44 Vital Signs Time Taken: 11:06 Temperature (F): 98.5 Height (in): 62 Pulse (bpm): 60 Weight (lbs): 171 Respiratory Rate (breaths/min): 18 Body Mass Index (BMI): 31.3 Blood Pressure (mmHg): 118/70 Reference Range: 80 - 120 mg / dl Electronic Signature(s) Signed: 04/01/2023 11:17:23 AM By: Dayton Scrape Entered By: Dayton Scrape on 04/01/2023 08:07:05

## 2023-04-01 NOTE — Progress Notes (Signed)
Jamie [name], Jamie [name] ([id number]) [id number] Page 1 of 11 Visit Report for 04/01/2023 HPI Details Patient Name: Date of Service: Jamie [name], [name]. 04/01/2023 10:30 A M Medical Record Number: [id number] Patient Account Number: [id number] Date of Birth/Sex: Treating RN: 07/22/1940 (82 y.o. F) Primary Care Provider: Belva Agee Other Clinician: Referring Provider: Treating Provider/Extender: Elby Beck in Treatment: 44 History of Present Illness HPI Description: ADMISSION 06/30/2020 Jamie [name] is an 82 year old woman who lives in [place]. She is here with her niece for review of wounds on the left medial lower leg and ankle. These have apparently been present for over a year and she followed with Dr. Olegario Messier at the [facility] in [place] for quite a period of time although it looks as though there was a initial consult wound from Dr. Marcha Solders on February 18 presumably there was therefore hiatus. At that point the wounds were described as being there for 3 months. She also tells me she was at the wound care center in [place] for a period of time with this. 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 She had venous studies that were negative for DVT ABIs on the right were 1.01 on the left 1.06. She has . had previous applications of puraply, compression which she does not tolerate very well. She has had several rounds of oral antibiotic therapy. She complains of unrelenting pain and she is seeing Dr. Reece Agar V of pain management apparently was on oxycodone but that did not help. She also had a skin biopsy done by Dr. Olegario Messier although we do not have that result. She does not appear to have an arterial issue. I am not completely clear what she has been putting on the wounds lately. 3/31; this patient has a particularly nasty set of wounds on the left medial ankle in the middle of what looks to  be hemosiderin deposition secondary to chronic venous insufficiency. She has a lot of pain followed by pain management. Dr. Marcha Solders apparently did a biopsy of something on the left leg last fall what I would like to get this result. She has a history of MRSA treatment. I do not believe she had reflux studies but she did have DVT rule out studies. Previous ABIs have not suggested arterial insufficiency 4/7; difficult wounds on the left medial ankle probably chronic venous insufficiency. With considerable effort on behalf of our case manager we were able to finally to speak to somebody at the hospital in [place] who indicated that no biopsy of this area have been done even though the patient describes this in some detail and is even able to point out where she thinks the biopsy was done. We have been using Sorbact. The PCR culture I did showed polymicrobial identification with Pseudomonas, staph aureus, Peptostreptococcus. All of this and low titers. Resistance genes identified were MRSA, staph virulence gene and tetracycline. We are going to send this to [facility] for a topical antibiotic which is something that we have had good success with recently in large venous ulcers with a lot of purulent drainage. There would not be an easy oral alternative here possibly line escalated and ciprofloxacin if we need to use systemic antibiotic 4/15; difficult area on the left medial ankle. Most likely chronic venous insufficiency. I think she will probably need venous reflux study I think she had DVT rule outs but not venous reflux studies. We have not yet obtained the topical antibiotics. She has  home health changing the dressing we have been using Sorbact for adherent fibrinous debris on the surface. Very difficult to remove 4/22; patient presents for 1 week follow-up. She has been using sore back under compression wraps and these are changed 3 times a week with home health. She also had Keystone antibiotics sent to  her house and brought them in today. She has no complaints or issues today. 5/2; patient is here for follow-up. She has been using Sorbact under compression. Very painful wound. She has been using Keystone antibiotics. Not much improvement although the more medial part of the wound has cleaned up nicely and the larger part of the wound about 50% slough covered. Part of the issue here is that she had stays at both [facility] wound care center, [facility] wound care center and now [place]. Not sure if she has had venous reflux studies. As far as we are able to tell she did not have a biopsy. My notes state that she did not have venous reflux studies just DVT rule outs. 5/16; patient goes for venous reflux studies this afternoon. She says that wound was biopsied which sounds like punch biopsies by Dr. Lynden Ang we do not have these results. We are using Sorbact. Very difficult wound to debride 5/23; patient presents for 1 week follow-up. She reports tolerating the wraps well with sorbact underneath. She had ABIs and venous reflux studies done. She has no issues or complaints today. She denies acute signs of infection. 6/6; I have reviewed the patient's vascular studies. Wounds are on the left medial and posterior calf. She had significant reflux in the greater saphenous vein in the in the mid thigh, distal thigh knee and the small saphenous vein in the popliteal fossa. The vein diameters do not look too impressive though. She is going to see the vascular surgeon on Wednesday. She also had venous reflux in the right common femoral vein. She did not have any evidence of a DVT or SVT I am wondering whether there is an ablation procedure that would benefit her in the greater saphenous vein on the left. . She tells [place] that home health put the dressing on too tight and she took off 1 layer. The swelling in her left leg is a little worse as a result of this. Not much change in the wound measurements so the surface of the  wound looks better Her arterial studies showed an ABI on the right of 1.14 with a triphasic waveform and a great toe pressure of 0.89. On the left her ABIs were noncompressible at 1.34 but with triphasic waveforms and a TBI of 0.97. Her greater toe pressure was 122. There was no evidence of significant bilateral arterial disease 6/20 patient went to see Dr. Durwin Nora. He did not think she had significant arterial disease. In terms of her venous duplex on the right side there was no evidence of a DVT or SVT there was deep venous reflux involving the common femoral vein no superficial vein reflux on the left side there was no DVT or SVT there was no deep vein graft reflux there was some reflux in the greater saphenous vein from the mid thigh to the knee but the vein here was not dilated. He thought these were venous wounds he prescribed a compression pump but I am not sure who we ordered it from The patient has been approved for Apligraf. Still using silver collagen this week 7/5; Apligraf #1 7/19 Apligraf #2. Decent improvement in the condition of  the wound bed. Epithelialization distal 8/2 Apligraf #3. No issues or complaints. Denies signs of infection. 8/17 Apligraf #4. No issues or concerns. Some complaints of pruritus and the rash. Our intake nurse brought up the fact that she had previously indicated possible cotton layer sensitivity we will therefore use kerlix in the bottom layer of the compression 8/30; the patient comes in with the area on her left medial leg just about healed. There is a superficial area more towards the tibia and a smaller open area BETSI, FUGUA ([id number]) [id number] Page 2 of 11 distally everything else is epithelialized. I do not think she requires another Apligraf 9/13; left medial leg is healed. 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 Her edema control is  good Readmisstion: 05-22-2022 upon evaluation today patient presents for initial inspection here in our clinic concerning issues that she has been having with a wound of the right lateral lower extremity. This is an area of a previous skin graft she tells me. With that being said she unfortunately had a scrape on this that occurred around 10 January. Since that time she has noted that this has just continued to get bigger and bigger in her niece who is present with her today actually states that 2 weeks ago when she saw that it was significantly smaller than what she sees currently. Obviously this is of utmost concern as we do not want this to continue to get larger when arrested and get moving in the right direction. Fortunately there does not appear to be any signs of systemic infection though locally I think we probably do have some infection present. I would obtain a culture to see what we have going on here and then we will subsequently see where things go going forward. Fortunately I think that she is in the right place at this point being at the wound center we can definitely do something to try to get this moving in the right direction. Patient does have a history of chronic venous insufficiency as well as coronary artery disease. In the past she has done well with compression wraps on the start with a 3 layer wrap I think she is probably can end up going to a 4-layer at some point but we will see how things do over the next week. She has been using wound cleanser along with she tells me a zinc and topical but again I am not sure exactly what that was. 05-29-2022 upon evaluation today patient appears to be doing well currently in regard to her wound. This actually is showing signs of improvement I am happy in that regard unfortunately her left leg has an area on the shin that has opened. This is the region where one of the areas at least we have previously taken care of. Nonetheless this is  small hoping we get it under control before things worsen significantly. 06-05-2022 upon evaluation today patient appears to be doing well currently in regard to her wounds. The actually seem to be fairly clean she is having still quite a bit of problem with pain at this point she would like to not have debridement today for all possible. With that being said I do believe that we are making some good progress I think the [facility] is doing a decent job here. 3/6; patient has had 3/6; patient with known severe chronic venous insufficiency. She apparently had a traumatic wound at home and has a  reasonably substantial wound on the right lateral lower leg and a smaller one on the left anterior lower leg as well. We have been using Hydrofera Blue and Unna boots 3/13; patient presents for follow-up. We have been using Hydrofera Blue under Coflex to the legs bilaterally. She has no issues or complaints today. 06-26-2022 upon evaluation today patient appears to be doing well currently in regard to her wound. This is measuring a little bit larger however. Fortunately I do not see any signs of infection this is on the right side. Fortunately the left side is actually completely healed which is great news. 07-03-2022 patient's wound unfortunately is continuing to show signs of being worse. Her compression which was the Tubigrip that I thought should be able to pull up actually slipped down and she was not able to pull that back up. With that being said that means that unfortunately her wound has continued to deteriorate since I last saw her. This is definitely not the direction that we are looking for. I discussed with her that I do believe we need to go ahead and see about getting her started on antibiotics and I subsequently would also like to go ahead and see about getting things moving forward with regard to a wound culture and making a change up her dressings as well but this can be changed more frequently  than just once a week. 07-10-2022 upon evaluation today patient actually appears to be doing much better. I do believe that the antibiotics have been beneficial for her. Fortunately I do not see any signs of active infection locally nor systemically which is great news. No fevers, chills, nausea, vomiting, or diarrhea. 07-17-2022 upon evaluation today patient appears to be doing well currently in regard to her wound which is actually showing signs of improvement this is slow but nonetheless prevalent that we are seeing good improvement here. Fortunately I do not see any signs of active infection locally nor systemically which is great news. 07-24-2022 upon evaluation today patient appears to be doing well currently in regard to her wound although the wrap that we had on has been slipping down and she really does not have anybody to help her with this at home health is not going to be coming out we could not get anybody that would actually be able to do the dressing changes. Fortunately I do not see any signs of active infection locally nor systemically which is great news. 08-07-22 poorly in regard to her leg compared to what it was previous. Fortunately there does not appear to be any signs of active infection locally nor systemically which is great news. No fevers, chills, nausea, vomiting, or diarrhea. With that being said it does appear that the patient may have some cellulitis in the leg which is not good. 08-14-2022 upon evaluation today patient appears to be doing somewhat better in regard to her wounds she still is having quite a bit of pain we are still not where we want to be as far as healing is concerned completely. Fortunately I do not see any evidence of active infection locally nor systemically which is great news and I am very pleased in that regard. 08-21-23 upon evaluation today patient appears to be doing poorly currently in regard to her wound. She has been tolerating the dressing changes  without complication. Fortunately there does not appear to be any signs of active infection locally nor systemically at this time. 08-28-2022 upon evaluation today patient appears to be doing  poorly in regard to her wound this was not wrapped appropriately home health did not go up high enough on the wrap. This has caused some issues and I discussed that with the patient today. I do believe that she is going to require aggressive wrapping and treatment which she is getting the [facility] topical antibiotics today they can start using that at the next wrap on Friday. In the meantime they need to make sure that the rapid and appropriately we wrote very specific orders today. 09-04-2022 upon evaluation today patient appears to be doing well currently in regard to her wound which I think is making progress is still hurting her quite significantly. Fortunately I do not see any signs of active infection locally or systemically which is great news. No fevers, chills, nausea, vomiting, or diarrhea. 09-11-2022 upon evaluation today patient's wound actually is showing signs of being a little bit smaller and looking a little bit better she still has a lot going on here however. Fortunately I do not see any evidence of active infection Worsening locally nor systemically which is great news. With that being said worsening still continue to use the topical Keystone antibiotics. 09-18-2022 upon evaluation today patient actually showing some signs of improvement. I am actually very pleased with where we stand compared to where we have been. I think that she is making good headway here. She is still having quite a bit of pain but it seems to be lessening compared to previous. 09-25-2022 upon evaluation patient's wound actually showing signs slowly but surely of improvement. Fortunately I do not see any signs of active infection at this time systemically and locally I feel like this is dramatically improved. She is doing well  with the [facility] topical antibiotics. 10-02-2022 upon evaluation today patient appears to be doing better in regard to her wound overall. I am very pleased with where things stand from a visual standpoint I do not see any signs of active infection and in general I think that we are moving in the right direction here. 10-09-2022 upon evaluation today patient appears to be doing well currently in regard to her wound. She has been tolerating the dressing changes without complication. Fortunately I do not see any signs of active infection at this time which is great news. 10-16-2022 upon evaluation today patient continues to have quite a bit of pain in regard to her right leg. We have been attempting to debride little by little as we could but again was pretty limited by the fact that she is having significant discomfort. We do not actually have any signs of active infection going on at this point that the [facility] topical antibiotics have been helping quite readily. I been attempting to do as much debridement as I can but if she were to have pain medication this would actually help and in the past when she did it was much more tolerable for her as far as taking the edge off. Right now she has been without as she is in transition from her previous pain management physician to someone new to manage this. Jamie [name], Jamie [name] ([id number]) [id number] Page 3 of 11 10-23-2022 upon evaluation patient appears to be doing excellent in regard to her wound. She has been tolerating the dressing changes without complication. Fortunately I do not see any evidence of active infection locally nor systemically which is great news. No fevers, chills, nausea, vomiting, or diarrhea. 7/24; patient with a wound secondary to chronic venous insufficiency on the right lateral  lower leg. We have been using Keystone antibiotic Hydrofera Blue under kerlix Coban. She complains of a lot of pain after last  week's debridement otherwise things appear to be improved per discussion with our intake nurse. 11-06-2022 upon evaluation today patient appears to be doing excellent at this point in regard to her wound. She has been tolerating the dressing changes without complication and to be honest she is making excellent progress towards complete closure. I am actually very pleased with where we stand I think that she is doing excellent as far as the overall appearance of the wound is concerned as well. She is going require some debridement however. 11-20-2022 upon evaluation today patient appears to be doing well currently in regard to her wound. She has been tolerating the dressing changes without complication. Fortunately there does not appear to be any signs of active infection locally or systemically which is great news and in general I do believe that we are moving in the right direction here. No fevers, chills, nausea, vomiting, or diarrhea. 11-27-2022 upon evaluation today patient appears to be doing a little better in regards to the overall appearance of her wound but unfortunately she is having increased pain. The collagen seems to be getting very dry and stuck to the wound bed which is causing her some issues here. Fortunately I do not see any signs of active infection locally nor systemically at this time. Fortunately I think that her wound is better but unfortunately her pain has not. For that reason I am going to avoid any debridement today since the patient is very upset about the pain and how bad this is hurting at this point. I think we can have to try something a little bit different I am thinking PolyMem may be a good option. 8/28; this patient has difficult venous wounds on the right lateral lower leg and ankle in the setting of chronic venous insufficiency. We have been using polymen under compression with kerlix Coban. 12-11-2022 upon evaluation today patient appears to be doing well currently in  regard to her wound. She has been tolerating the dressing changes without complication and actually appears to be doing much better this week compared to when I saw her 2 weeks ago. The wound is measuring significantly smaller. We are using PolyMem along with Kerlix and Coban. 12-18-2022 upon evaluation today patient appears to be doing well currently in regard to her wound. This is actually showing signs of improvement is measuring a little bit smaller and looking better. Fortunately I do not see any evidence of worsening overall and I believe that we will making good headway with the PolyMem and the [facility] topical antibiotics. 12-25-2022 upon evaluation today patient appears to be doing well currently in regard to her wound. She has been tolerating the dressing changes without complication. Fortunately I do not see any evidence of infection locally or systemically which is great news and in general I do believe that we will make an excellent headway towards complete closure also excellent news. 01-01-2023 upon evaluation patient's wound actually showing signs of improvement the wrap was actually put on properly this week and this is good news. Overall I am extremely happy with where things stand and how this appears today I do not see any signs of worsening overall and I believe that the patient is making excellent headway towards complete closure which is great news. 01/08/2023 upon evaluation today patient appears to be doing well currently in regard to her leg. She is  actually draining much less unfortunately home health is just not doing very well getting the supplies necessary in order to continue to take care of the patient. They are now telling me they cannot get the PolyMem which is something that has been doing really well for her. That really does not make sense to me you came to get PolyMem for a hospice patient. Nonetheless either way I think we may need to just discontinue treatment with  home health and just manage the patient here at the clinic. 01-15-2023 upon evaluation today patient appears to be doing well currently in regard to her wound. She has been tolerating the dressing changes without complication. Fortunately I do not see any signs of infection I think she is doing quite well and very pleased with where we stand today. No fevers, chills, nausea, vomiting, or diarrhea. 01-22-2023 upon evaluation today patient appears to be doing well currently in regard to her wound. She has been tolerating the dressing changes without complication and both wounds are measuring smaller today and look to be doing excellent. I am actually very pleased with what ever standing here and I think that she is making really good headway towards closure which is great news. 01-29-2023 upon evaluation today patient actually tells me she has been having some increased pain. Subsequently it took a little bit of drilling down but in the end I realized that we actually ended up putting a full Unna boot on her last week as opposed to just using a good anchor at the top and bottom. I think this is what wound up with her having increased discomfort because she tells me as she was doing well when she came in last time and by the time she left she tells me this was already getting significantly worse. And she hurt most of the week. With that being said I do believe that the patient is doing quite well at this point with regard to her wounds and the wrap did okay it was not a problem other than the increased pain I think we may benefit from a Urgo K2 light compression wrap as this will be much more comfortable I think compared to what we have been doing with just the Kerlix Coban anyway. 02-05-2023 upon evaluation today patient appears to be doing well currently in regard to her wounds. She has been tolerating the dressing changes without complication. Fortunately I do not see any signs of active infection  looking systemically which is great news. No fevers, chills, nausea, vomiting, or diarrhea. 11/6; patient I remember from a previous stay in this clinic With a left lower extremity wound.. She has a right lower extremity venous wound. She has been using Keystone, polymen under [employer]. Her wound now is to remaining open areas. There is no evidence of infection although she is tender in the area. I wonder whether there is stasis dermatitis here. She reminds me that she had some form of skin graft on this area during his stay at the [facility] wound care center presumably in around 2022. 11/13; difficult wound on the right lateral lower extremity. She has a history of venous wounds in this area and a history of a skin graft in this area. She can planes about a lot of pain but she has not been systemically unwell. No fever chills etc. 11/20; continue difficult wounds on the right lateral lower extremity. She has a history of venous wounds in this area and a history of skin  graft in this area. She continues to complain of pain. She comes in today with completely nonviable surfaces. She has been using [employer] under and Urgo K2 light 12/3; significant improvement in wound dimensions still 2 open wounds which were initially part of the larger single wound. We have been using Keystone polymen under [employer]. The wounds are smaller today. The surface does not look as viable as I might like although she does not tolerate mechanical debridement at all 12/11; 2 open wounds 1 on the right anterior and 1 on the lateral lower leg. We have been using Keystone polymen under Urgo K2 lite's. The wounds are measuring smaller. 12/18; right anterior and right lateral lower leg.. 2 open areas. Somewhat smaller we have been using Keystone polymen under Urgo K2 lite's. The surrounding area is mostly scar tissue from previous skin grafts in this area. 1. We are going to continue with the polymen Ag ABDs and Urgo  K2's. He is doing nicely on both sides. 2. Again we talked about compression stockings when these have healed. I think he is going to require 30/40 mm of equivalent compression. Juxta lite stockings would be a good alternative but he seemed more interested in standard over the toe stockings 1. We are going to continue with the polymen Ag ABDs and Urgo K2's. He is doing nicely on both sides. 2. Again we talked about compression stockings when these have healed. I think he is going to require 30/40 mm of equivalent compression. Juxta lite stockings would be a good alternative but he seemed more interested in standard over the toe stockings Jamie [name], Jamie [name] ([id number]) [id number] Page 4 of 11 Electronic Signature(s) Signed: 04/01/2023 11:51:18 AM By: [name] MD Entered By: [name] on 04/01/2023 08:23:18 -------------------------------------------------------------------------------- Physical Exam Details Patient Name: Date of Service: Jamie [name], [name] 04/01/2023 10:30 A M Medical Record Number: [id number] Patient Account Number: [id number] Date of Birth/Sex: Treating RN: 1940/10/01 (82 y.o. F) Primary Care Provider: Belva Agee Other Clinician: Referring Provider: Treating Provider/Extender: Elby Beck in Treatment: 44 Constitutional Sitting or standing Blood Pressure is within target range for patient.. Pulse regular and within target range for patient.Marland Kitchen Respirations regular, non-labored and within target range.. Temperature is normal and within the target range for the patient.Marland Kitchen Appears in no distress. Notes Wound exam; 2 open wounds posterior and posterior lateral. The area on the lateral part of this right lower leg is just about fully epithelialized. She has a "comma" shaped wound posteriorly. Almost all of this has improved nice rings of epithelialization. There is a mild amount of surface slough but I did not  do mechanical debridement today as she tolerates this terribly. I see no evidence of surrounding infection. She has good dorsi and plantarflexion without any pain Electronic Signature(s) Signed: 04/01/2023 11:51:18 AM By: [name] MD Entered By: [name] on 04/01/2023 08:24:48 -------------------------------------------------------------------------------- Physician Orders Details Patient Name: Date of Service: Jamie [name], [name] 04/01/2023 10:30 A M Medical Record Number: [id number] Patient Account Number: [id number] Date of Birth/Sex: Treating RN: [birth year] (82 y.o. Orville Govern Primary Care Provider: Belva Agee Other Clinician: Referring Provider: Treating Provider/Extender: Elby Beck in Treatment: 48 Verbal / Phone Orders: No Diagnosis Coding Follow-up Appointments ppointment in 1 week. - Dr. Leanord Hawking Thursday 04/10/2023 1045 (already scheduled) Return A ppointment in 2 weeks. - Thursday 04/10/2023 1045 (already scheduled) Return A Return appointment in 3 weeks. Leonard Schwartz Return appointment in 1  month. Leonard Schwartz Other: - you will need to wear compression stockings for right leg once it heals. Anesthetic Wound #5 Right,Lateral Lower Leg (In clinic) Topical Lidocaine 4% applied to wound bed Bathing/ Shower/ Hygiene May shower and wash wound with soap and water. - with dressing changes. Edema Control - Orders / Instructions Elevate legs to the level of the heart or above for 30 minutes daily and/or when sitting for 3-4 times a day throughout the day. Avoid standing for long periods of time. Exercise regularly - As tolerated Wound Treatment Wound #5 - Lower Leg Wound Laterality: Right, Lateral Jamie [name], Jamie [name] ([id number]) [id number] Page 5 of 11 Cleanser: Soap and Water 1 x Per Week/30 Days Discharge Instructions: May shower and wash wound with dial antibacterial soap and water prior to dressing  change. Peri-Wound Care: Sween Lotion (Moisturizing lotion) 1 x Per Week/30 Days Discharge Instructions: Apply moisturizing lotion as directed Topical: Skintegrity Hydrogel 4 (oz) 1 x Per Week/30 Days Discharge Instructions: Apply hydrogel with sorbact Prim Dressing: PolyMem Silver Non-Adhesive Dressing, 4.25x4.25 in 1 x Per Week/30 Days ary Discharge Instructions: Apply to wound bed as instructed Secondary Dressing: ABD Pad, 8x10 1 x Per Week/30 Days Discharge Instructions: Apply over primary dressing as directed. Secondary Dressing: Woven Gauze Sponge, Non-Sterile 4x4 in 1 x Per Week/30 Days Discharge Instructions: Apply over primary dressing as directed. Compression Wrap: Kerlix Roll 4.5x3.1 (in/yd) 1 x Per Week/30 Days Discharge Instructions: Apply Kerlix and Coban compression as directed. Compression Wrap: Coban Self-Adherent Wrap 4x5 (in/yd) 1 x Per Week/30 Days Discharge Instructions: Apply over Kerlix as directed. Compression Wrap: unna boot FIRST LAYER **** SEE INSTRUCTIONS 1 x Per Week/30 Days Discharge Instructions: APPLY FIRST LAYER UNNA BOOT AT BASES OF TOES AND JUST BELOW THE KNEE TO HOLD COMPRESSION WRAP IN PLACE. Wound #7 - Lower Leg Wound Laterality: Right, Posterior Cleanser: Soap and Water 1 x Per Week/30 Days Discharge Instructions: May shower and wash wound with dial antibacterial soap and water prior to dressing change. Peri-Wound Care: Sween Lotion (Moisturizing lotion) 1 x Per Week/30 Days Discharge Instructions: Apply moisturizing lotion as directed Topical: Skintegrity Hydrogel 4 (oz) 1 x Per Week/30 Days Discharge Instructions: Apply hydrogel with sorbact Prim Dressing: PolyMem Silver Non-Adhesive Dressing, 4.25x4.25 in 1 x Per Week/30 Days ary Discharge Instructions: Apply to wound bed as instructed Secondary Dressing: ABD Pad, 8x10 1 x Per Week/30 Days Discharge Instructions: Apply over primary dressing as directed. Secondary Dressing: Woven Gauze Sponge,  Non-Sterile 4x4 in 1 x Per Week/30 Days Discharge Instructions: Apply over primary dressing as directed. Compression Wrap: Kerlix Roll 4.5x3.1 (in/yd) 1 x Per Week/30 Days Discharge Instructions: Apply Kerlix and Coban compression as directed. Compression Wrap: Coban Self-Adherent Wrap 4x5 (in/yd) 1 x Per Week/30 Days Discharge Instructions: Apply over Kerlix as directed. Compression Wrap: unna boot FIRST LAYER **** SEE INSTRUCTIONS 1 x Per Week/30 Days Discharge Instructions: APPLY FIRST LAYER UNNA BOOT AT BASES OF TOES AND JUST BELOW THE KNEE TO HOLD COMPRESSION WRAP IN PLACE. Electronic Signature(s) Signed: 04/01/2023 11:51:18 AM By: [name] MD Signed: 04/01/2023 12:06:25 PM By: Redmond Pulling RN, BSN Entered By: Redmond Pulling on 04/01/2023 08:18:51 -------------------------------------------------------------------------------- Problem List Details Patient Name: Date of Service: Jamie [name], [name] 04/01/2023 10:30 A M Medical Record Number: [id number] Patient Account Number: [id number] Date of Birth/Sex: Treating RN: 1940/12/25 (82 y.o. 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 04/01/2023 10:30 A M Medical Record Number: [id number] Patient Account Number: [id number] Date of Birth/Sex: Treating RN: 07/26/1940 (82 y.o. F) Primary Care Provider: Belva Agee Other Clinician: Referring Provider: Treating Provider/Extender: Elby Beck in Treatment: 44 Subjective History of Present Illness (HPI) ADMISSION 06/30/2020 Mrs. Kresge is an 82 year old woman who lives in [place]. She is here with her niece for review of wounds on the left medial lower leg and ankle. These have apparently been present for over a year and she followed with Dr. Olegario Messier at the [facility] in [place] for quite a period of time although it looks as though there was a initial consult wound from Dr. Marcha Solders on February 18 presumably there was therefore hiatus. At that point the wounds were described as being there for 3 months. She also tells me she was at the wound care center in [place] for a period of time with this. 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 She had venous studies that were negative for DVT ABIs on the right were 1.01 on the left 1.06. She has . had previous applications of puraply, compression which she does not tolerate very well. She has had several rounds of oral antibiotic therapy. She complains of unrelenting pain and she is seeing Dr. Reece Agar V of pain management apparently was on oxycodone but that did not help. She also had a skin biopsy done by Dr. Olegario Messier although we do not have that result. She does not appear to have an arterial issue. I am not completely clear what she has been putting on the wounds lately. 3/31; this patient has a particularly nasty set of wounds on the left medial ankle in the middle of what looks to be  hemosiderin deposition secondary to chronic venous insufficiency. She has a lot of pain followed by pain management. Dr. Marcha Solders apparently did a biopsy of something on the left leg last fall what I would like to get this result. She has a history of MRSA treatment. I do not believe she had reflux studies but she did have DVT rule out studies. Previous ABIs have not suggested arterial insufficiency 4/7; difficult wounds on the left medial ankle probably chronic venous insufficiency. With considerable effort on behalf of our case manager we were able to finally to speak to somebody at the hospital in [place] who indicated that no biopsy of this area have been done even though the patient describes this in some detail and is even able to point out where she thinks the biopsy was done. We have been using Sorbact. The PCR culture I did showed polymicrobial identification with Pseudomonas, staph aureus, Peptostreptococcus. All of this and low titers. Resistance genes identified were MRSA, staph virulence gene and tetracycline. We are going to send this to [facility] for a topical antibiotic which is something that we have had good success with recently in large  venous ulcers with a lot of purulent drainage. There would not be an easy oral alternative here possibly line escalated and ciprofloxacin if we need to use systemic antibiotic Jamie [name], Jamie [name] ([id number]) [id number] Page 7 of 11 4/15; difficult area on the left medial ankle. Most likely chronic venous insufficiency. I think she will probably need venous reflux study I think she had DVT rule outs but not venous reflux studies. We have not yet obtained the topical antibiotics. She has home health changing the dressing we have been using Sorbact for adherent fibrinous debris on the surface. Very difficult to remove 4/22; patient presents for 1 week follow-up. She has been using sore back under compression wraps and these are  changed 3 times a week with home health. She also had Keystone antibiotics sent to her house and brought them in today. She has no complaints or issues today. 5/2; patient is here for follow-up. She has been using Sorbact under compression. Very painful wound. She has been using Keystone antibiotics. Not much improvement although the more medial part of the wound has cleaned up nicely and the larger part of the wound about 50% slough covered. Part of the issue here is that she had stays at both [facility] wound care center, [facility] wound care center and now [place]. Not sure if she has had venous reflux studies. As far as we are able to tell she did not have a biopsy. My notes state that she did not have venous reflux studies just DVT rule outs. 5/16; patient goes for venous reflux studies this afternoon. She says that wound was biopsied which sounds like punch biopsies by Dr. Lynden Ang we do not have these results. We are using Sorbact. Very difficult wound to debride 5/23; patient presents for 1 week follow-up. She reports tolerating the wraps well with sorbact underneath. She had ABIs and venous reflux studies done. She has no issues or complaints today. She denies acute signs of infection. 6/6; I have reviewed the patient's vascular studies. Wounds are on the left medial and posterior calf. She had significant reflux in the greater saphenous vein in the in the mid thigh, distal thigh knee and the small saphenous vein in the popliteal fossa. The vein diameters do not look too impressive though. She is going to see the vascular surgeon on Wednesday. She also had venous reflux in the right common femoral vein. She did not have any evidence of a DVT or SVT I am wondering whether there is an ablation procedure that would benefit her in the greater saphenous vein on the left. . She tells [place] that home health put the dressing on too tight and she took off 1 layer. The swelling in her left leg is a little worse as a  result of this. Not much change in the wound measurements so the surface of the wound looks better Her arterial studies showed an ABI on the right of 1.14 with a triphasic waveform and a great toe pressure of 0.89. On the left her ABIs were noncompressible at 1.34 but with triphasic waveforms and a TBI of 0.97. Her greater toe pressure was 122. There was no evidence of significant bilateral arterial disease 6/20 patient went to see Dr. Durwin Nora. He did not think she had significant arterial disease. In terms of her venous duplex on the right side there was no evidence of a DVT or SVT there was deep venous reflux involving the common femoral vein no superficial vein reflux on the left side  there was no DVT or SVT there was no deep vein graft reflux there was some reflux in the greater saphenous vein from the mid thigh to the knee but the vein here was not dilated. He thought these were venous wounds he prescribed a compression pump but I am not sure who we ordered it from The patient has been approved for Apligraf. Still using silver collagen this week 7/5; Apligraf #1 7/19 Apligraf #2. Decent improvement in the condition of the wound bed. Epithelialization distal 8/2 Apligraf #3. No issues or complaints. Denies signs of infection. 8/17 Apligraf #4. No issues or concerns. Some complaints of pruritus and the rash. Our intake nurse brought up the fact that she had previously indicated possible cotton layer sensitivity we will therefore use kerlix in the bottom layer of the compression 8/30; the patient comes in with the area on her left medial leg just about healed. There is a superficial area more towards the tibia and a smaller open area distally everything else is epithelialized. I do not think she requires another Apligraf 9/13; left medial leg is healed. 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 Her edema control is  good Readmisstion: 05-22-2022 upon evaluation today patient presents for initial inspection here in our clinic concerning issues that she has been having with a wound of the right lateral lower extremity. This is an area of a previous skin graft she tells me. With that being said she unfortunately had a scrape on this that occurred around 10 January. Since that time she has noted that this has just continued to get bigger and bigger in her niece who is present with her today actually states that 2 weeks ago when she saw that it was significantly smaller than what she sees currently. Obviously this is of utmost concern as we do not want this to continue to get larger when arrested and get moving in the right direction. Fortunately there does not appear to be any signs of systemic infection though locally I think we probably do have some infection present. I would obtain a culture to see what we have going on here and then we will subsequently see where things go going forward. Fortunately I think that she is in the right place at this point being at the wound center we can definitely do something to try to get this moving in the right direction. Patient does have a history of chronic venous insufficiency as well as coronary artery disease. In the past she has done well with compression wraps on the start with a 3 layer wrap I think she is probably can end up going to a 4-layer at some point but we will see how things do over the next week. She has been using wound cleanser along with she tells me a zinc and topical but again I am not sure exactly what that was. 05-29-2022 upon evaluation today patient appears to be doing well currently in regard to her wound. This actually is showing signs of improvement I am happy in that regard unfortunately her left leg has an area on the shin that has opened. This is the region where one of the areas at least we have previously taken care of. Nonetheless this is  small hoping we get it under control before things worsen significantly. 06-05-2022 upon evaluation today patient appears to be doing well currently in regard to her wounds. The actually seem to be fairly clean  she is having still quite a bit of problem with pain at this point she would like to not have debridement today for all possible. With that being said I do believe that we are making some good progress I think the [facility] is doing a decent job here. 3/6; patient has had 3/6; patient with known severe chronic venous insufficiency. She apparently had a traumatic wound at home and has a reasonably substantial wound on the right lateral lower leg and a smaller one on the left anterior lower leg as well. We have been using Hydrofera Blue and Unna boots 3/13; patient presents for follow-up. We have been using Hydrofera Blue under Coflex to the legs bilaterally. She has no issues or complaints today. 06-26-2022 upon evaluation today patient appears to be doing well currently in regard to her wound. This is measuring a little bit larger however. Fortunately I do not see any signs of infection this is on the right side. Fortunately the left side is actually completely healed which is great news. 07-03-2022 patient's wound unfortunately is continuing to show signs of being worse. Her compression which was the Tubigrip that I thought should be able to pull up actually slipped down and she was not able to pull that back up. With that being said that means that unfortunately her wound has continued to deteriorate since I last saw her. This is definitely not the direction that we are looking for. I discussed with her that I do believe we need to go ahead and see about getting her started on antibiotics and I subsequently would also like to go ahead and see about getting things moving forward with regard to a wound culture and making a change up her dressings as well but this can be changed more frequently  than just once a week. 07-10-2022 upon evaluation today patient actually appears to be doing much better. I do believe that the antibiotics have been beneficial for her. Fortunately I do not see any signs of active infection locally nor systemically which is great news. No fevers, chills, nausea, vomiting, or diarrhea. 07-17-2022 upon evaluation today patient appears to be doing well currently in regard to her wound which is actually showing signs of improvement this is slow but nonetheless prevalent that we are seeing good improvement here. Fortunately I do not see any signs of active infection locally nor systemically which is great news. 07-24-2022 upon evaluation today patient appears to be doing well currently in regard to her wound although the wrap that we had on has been slipping down and she really does not have anybody to help her with this at home health is not going to be coming out we could not get anybody that would actually be able to do the dressing changes. Fortunately I do not see any signs of active infection locally nor systemically which is great news. 08-07-22 poorly in regard to her leg compared to what it was previous. Fortunately there does not appear to be any signs of active infection locally nor systemically which is great news. No fevers, chills, nausea, vomiting, or diarrhea. With that being said it does appear that the patient may have some cellulitis in the leg which is not good. Jamie [name], Jamie [name] ([id number]) [id number] Page 8 of 11 08-14-2022 upon evaluation today patient appears to be doing somewhat better in regard to her wounds she still is having quite a bit of pain we are still not where we want to be as far as  healing is concerned completely. Fortunately I do not see any evidence of active infection locally nor systemically which is great news and I am very pleased in that regard. 08-21-23 upon evaluation today patient appears to be doing  poorly currently in regard to her wound. She has been tolerating the dressing changes without complication. Fortunately there does not appear to be any signs of active infection locally nor systemically at this time. 08-28-2022 upon evaluation today patient appears to be doing poorly in regard to her wound this was not wrapped appropriately home health did not go up high enough on the wrap. This has caused some issues and I discussed that with the patient today. I do believe that she is going to require aggressive wrapping and treatment which she is getting the [facility] topical antibiotics today they can start using that at the next wrap on Friday. In the meantime they need to make sure that the rapid and appropriately we wrote very specific orders today. 09-04-2022 upon evaluation today patient appears to be doing well currently in regard to her wound which I think is making progress is still hurting her quite significantly. Fortunately I do not see any signs of active infection locally or systemically which is great news. No fevers, chills, nausea, vomiting, or diarrhea. 09-11-2022 upon evaluation today patient's wound actually is showing signs of being a little bit smaller and looking a little bit better she still has a lot going on here however. Fortunately I do not see any evidence of active infection Worsening locally nor systemically which is great news. With that being said worsening still continue to use the topical Keystone antibiotics. 09-18-2022 upon evaluation today patient actually showing some signs of improvement. I am actually very pleased with where we stand compared to where we have been. I think that she is making good headway here. She is still having quite a bit of pain but it seems to be lessening compared to previous. 09-25-2022 upon evaluation patient's wound actually showing signs slowly but surely of improvement. Fortunately I do not see any signs of active infection at this time  systemically and locally I feel like this is dramatically improved. She is doing well with the [facility] topical antibiotics. 10-02-2022 upon evaluation today patient appears to be doing better in regard to her wound overall. I am very pleased with where things stand from a visual standpoint I do not see any signs of active infection and in general I think that we are moving in the right direction here. 10-09-2022 upon evaluation today patient appears to be doing well currently in regard to her wound. She has been tolerating the dressing changes without complication. Fortunately I do not see any signs of active infection at this time which is great news. 10-16-2022 upon evaluation today patient continues to have quite a bit of pain in regard to her right leg. We have been attempting to debride little by little as we could but again was pretty limited by the fact that she is having significant discomfort. We do not actually have any signs of active infection going on at this point that the [facility] topical antibiotics have been helping quite readily. I been attempting to do as much debridement as I can but if she were to have pain medication this would actually help and in the past when she did it was much more tolerable for her as far as taking the edge off. Right now she has been without as she is in transition  from her previous pain management physician to someone new to manage this. 10-23-2022 upon evaluation patient appears to be doing excellent in regard to her wound. She has been tolerating the dressing changes without complication. Fortunately I do not see any evidence of active infection locally nor systemically which is great news. No fevers, chills, nausea, vomiting, or diarrhea. 7/24; patient with a wound secondary to chronic venous insufficiency on the right lateral lower leg. We have been using Keystone antibiotic Hydrofera Blue under kerlix Coban. She complains of a lot of pain after last week's  debridement otherwise things appear to be improved per discussion with our intake nurse. 11-06-2022 upon evaluation today patient appears to be doing excellent at this point in regard to her wound. She has been tolerating the dressing changes without complication and to be honest she is making excellent progress towards complete closure. I am actually very pleased with where we stand I think that she is doing excellent as far as the overall appearance of the wound is concerned as well. She is going require some debridement however. 11-20-2022 upon evaluation today patient appears to be doing well currently in regard to her wound. She has been tolerating the dressing changes without complication. Fortunately there does not appear to be any signs of active infection locally or systemically which is great news and in general I do believe that we are moving in the right direction here. No fevers, chills, nausea, vomiting, or diarrhea. 11-27-2022 upon evaluation today patient appears to be doing a little better in regards to the overall appearance of her wound but unfortunately she is having increased pain. The collagen seems to be getting very dry and stuck to the wound bed which is causing her some issues here. Fortunately I do not see any signs of active infection locally nor systemically at this time. Fortunately I think that her wound is better but unfortunately her pain has not. For that reason I am going to avoid any debridement today since the patient is very upset about the pain and how bad this is hurting at this point. I think we can have to try something a little bit different I am thinking PolyMem may be a good option. 8/28; this patient has difficult venous wounds on the right lateral lower leg and ankle in the setting of chronic venous insufficiency. We have been using polymen under compression with kerlix Coban. 12-11-2022 upon evaluation today patient appears to be doing well currently in regard  to her wound. She has been tolerating the dressing changes without complication and actually appears to be doing much better this week compared to when I saw her 2 weeks ago. The wound is measuring significantly smaller. We are using PolyMem along with Kerlix and Coban. 12-18-2022 upon evaluation today patient appears to be doing well currently in regard to her wound. This is actually showing signs of improvement is measuring a little bit smaller and looking better. Fortunately I do not see any evidence of worsening overall and I believe that we will making good headway with the PolyMem and the [facility] topical antibiotics. 12-25-2022 upon evaluation today patient appears to be doing well currently in regard to her wound. She has been tolerating the dressing changes without complication. Fortunately I do not see any evidence of infection locally or systemically which is great news and in general I do believe that we will make an excellent headway towards complete closure also excellent news. 01-01-2023 upon evaluation patient's wound actually showing signs of  improvement the wrap was actually put on properly this week and this is good news. Overall I am extremely happy with where things stand and how this appears today I do not see any signs of worsening overall and I believe that the patient is making excellent headway towards complete closure which is great news. 01/08/2023 upon evaluation today patient appears to be doing well currently in regard to her leg. She is actually draining much less unfortunately home health is just not doing very well getting the supplies necessary in order to continue to take care of the patient. They are now telling me they cannot get the PolyMem which is something that has been doing really well for her. That really does not make sense to me you came to get PolyMem for a hospice patient. Nonetheless either way I think we may need to just discontinue treatment with home  health and just manage the patient here at the clinic. 01-15-2023 upon evaluation today patient appears to be doing well currently in regard to her wound. She has been tolerating the dressing changes without complication. Fortunately I do not see any signs of infection I think she is doing quite well and very pleased with where we stand today. No fevers, chills, nausea, vomiting, or diarrhea. 01-22-2023 upon evaluation today patient appears to be doing well currently in regard to her wound. She has been tolerating the dressing changes without complication and both wounds are measuring smaller today and look to be doing excellent. I am actually very pleased with what ever standing here and I think that she is making really good headway towards closure which is great news. 01-29-2023 upon evaluation today patient actually tells me she has been having some increased pain. Subsequently it took a little bit of drilling down but in the [place] ([id number]) [id number] Page 9 of 11 end I realized that we actually ended up putting a full Unna boot on her last week as opposed to just using a good anchor at the top and bottom. I think this is what wound up with her having increased discomfort because she tells me as she was doing well when she came in last time and by the time she left she tells me this was already getting significantly worse. And she hurt most of the week. With that being said I do believe that the patient is doing quite well at this point with regard to her wounds and the wrap did okay it was not a problem other than the increased pain I think we may benefit from a Urgo K2 light compression wrap as this will be much more comfortable I think compared to what we have been doing with just the Kerlix Coban anyway. 02-05-2023 upon evaluation today patient appears to be doing well currently in regard to her wounds. She has been tolerating the dressing changes  without complication. Fortunately I do not see any signs of active infection looking systemically which is great news. No fevers, chills, nausea, vomiting, or diarrhea. 11/6; patient I remember from a previous stay in this clinic With a left lower extremity wound.. She has a right lower extremity venous wound. She has been using Keystone, polymen under [employer]. Her wound now is to remaining open areas. There is no evidence of infection although she is tender in the area. I wonder whether there is stasis dermatitis here. She reminds me that she had some form of skin graft on this area during his stay at  the [facility] wound care center presumably in around 2022. 11/13; difficult wound on the right lateral lower extremity. She has a history of venous wounds in this area and a history of a skin graft in this area. She can planes about a lot of pain but she has not been systemically unwell. No fever chills etc. 11/20; continue difficult wounds on the right lateral lower extremity. She has a history of venous wounds in this area and a history of skin graft in this area. She continues to complain of pain. She comes in today with completely nonviable surfaces. She has been using [employer] under and Urgo K2 light 12/3; significant improvement in wound dimensions still 2 open wounds which were initially part of the larger single wound. We have been using Keystone polymen under [employer]. The wounds are smaller today. The surface does not look as viable as I might like although she does not tolerate mechanical debridement at all 12/11; 2 open wounds 1 on the right anterior and 1 on the lateral lower leg. We have been using Keystone polymen under Urgo K2 lite's. The wounds are measuring smaller. 12/18; right anterior and right lateral lower leg.. 2 open areas. Somewhat smaller we have been using Keystone polymen under Urgo K2 lite's. The surrounding area is mostly scar tissue from previous skin grafts  in this area. 1. We are going to continue with the polymen Ag ABDs and Urgo K2's. He is doing nicely on both sides. 2. Again we talked about compression stockings when these have healed. I think he is going to require 30/40 mm of equivalent compression. Juxta lite stockings would be a good alternative but he seemed more interested in standard over the toe stockings 1. We are going to continue with the polymen Ag ABDs and Urgo K2's. He is doing nicely on both sides. 2. Again we talked about compression stockings when these have healed. I think he is going to require 30/40 mm of equivalent compression. Juxta lite stockings would be a good alternative but he seemed more interested in standard over the toe stockings Objective Constitutional Sitting or standing Blood Pressure is within target range for patient.. Pulse regular and within target range for patient.Marland Kitchen Respirations regular, non-labored and within target range.. Temperature is normal and within the target range for the patient.Marland Kitchen Appears in no distress. Vitals Time Taken: 11:06 AM, Height: 62 in, Weight: 171 lbs, BMI: 31.3, Temperature: 98.5 F, Pulse: 60 bpm, Respiratory Rate: 18 breaths/min, Blood Pressure: 118/70 mmHg. General Notes: Wound exam; 2 open wounds posterior and posterior lateral. The area on the lateral part of this right lower leg is just about fully epithelialized. She has a "comma" shaped wound posteriorly. Almost all of this has improved nice rings of epithelialization. There is a mild amount of surface slough but I did not do mechanical debridement today as she tolerates this terribly. I see no evidence of surrounding infection. She has good dorsi and plantarflexion without any pain Integumentary (Hair, Skin) Wound #5 status is Open. Original cause of wound was Trauma. The date acquired was: 04/17/2022. The wound has been in treatment 44 weeks. The wound is located on the Right,Lateral Lower Leg. The wound measures 0.9cm  length x 0.4cm width x 0.1cm depth; 0.283cm^2 area and 0.028cm^3 volume. There is Fat Layer (Subcutaneous Tissue) exposed. There is a medium amount of serosanguineous drainage noted. The wound margin is thickened. There is large (67-100%) red, pink granulation within the wound bed. There is a small (  1-33%) amount of necrotic tissue within the wound bed including Adherent Slough. The periwound skin appearance exhibited: Scarring, Hemosiderin Staining. The periwound skin appearance did not exhibit: Callus, Crepitus, Excoriation, Induration, Rash, Dry/Scaly, Maceration, Atrophie Blanche, Cyanosis, Ecchymosis, Mottled, Pallor, Rubor, Erythema. Periwound temperature was noted as No Abnormality. The periwound has tenderness on palpation. Wound #7 status is Open. Original cause of wound was Gradually Appeared. The date acquired was: 01/01/2023. The wound has been in treatment 12 weeks. The wound is located on the Right,Posterior Lower Leg. The wound measures 3cm length x 1.5cm width x 0.1cm depth; 3.534cm^2 area and 0.353cm^3 volume. There is Fat Layer (Subcutaneous Tissue) exposed. There is a medium amount of serosanguineous drainage noted. The wound margin is thickened. There is large (67-100%) red, pink granulation within the wound bed. There is a small (1-33%) amount of necrotic tissue within the wound bed including Adherent Slough. The periwound skin appearance exhibited: Dry/Scaly. The periwound skin appearance did not exhibit: Callus, Crepitus, Excoriation, Induration, Rash, Scarring, Maceration, Atrophie Blanche, Cyanosis, Ecchymosis, Hemosiderin Staining, Mottled, Pallor, Rubor, Erythema. Periwound temperature was noted as No Abnormality. The periwound has tenderness on palpation. 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 - Dr. Leanord Hawking Thursday 04/10/2023 1045 (already scheduled) Return Appointment in 2 weeks. - Thursday 04/10/2023 1045 (already scheduled) Return appointment in 3 weeks. Leonard Schwartz Return appointment in 1 month. Leonard Schwartz Other: - you will need to wear compression stockings for right leg once it heals. Anesthetic: Wound #5 Right,Lateral Lower Leg: (In clinic) Topical Lidocaine 4% applied to wound bed Bathing/ Shower/ Hygiene: May shower and wash wound with soap and water. - with dressing changes. Edema Control - Orders / Instructions: Elevate legs to the level of the heart or above for 30 minutes daily and/or when sitting for 3-4 times a day throughout the day. Avoid standing for long periods of time. Exercise regularly - As tolerated WOUND #5: - Lower Leg Wound Laterality: Right, Lateral Cleanser: Soap and Water 1 x Per Week/30 Days Discharge Instructions: May shower and wash wound with dial antibacterial soap and water prior to dressing change. Peri-Wound Care: Sween Lotion (Moisturizing lotion) 1 x Per Week/30 Days Discharge Instructions: Apply moisturizing lotion as directed Topical: Skintegrity Hydrogel 4 (oz) 1 x Per Week/30 Days Discharge Instructions: Apply hydrogel with sorbact Prim Dressing: PolyMem Silver Non-Adhesive Dressing, 4.25x4.25 in 1 x Per Week/30 Days ary Discharge Instructions: Apply to wound bed as instructed Secondary Dressing: ABD Pad, 8x10 1 x Per Week/30 Days Discharge Instructions: Apply over primary dressing as directed. Secondary Dressing: Woven Gauze Sponge, Non-Sterile 4x4 in 1 x Per Week/30 Days Discharge Instructions: Apply over primary dressing as directed. Com pression Wrap: Kerlix Roll 4.5x3.1 (in/yd) 1 x Per Week/30  Days Discharge Instructions: Apply Kerlix and Coban compression as directed. Com pression Wrap: Coban Self-Adherent Wrap 4x5 (in/yd) 1 x Per Week/30 Days Discharge Instructions: Apply over Kerlix as directed. Com pression Wrap: unna boot FIRST LAYER **** SEE INSTRUCTIONS 1 x Per Week/30 Days Discharge Instructions: APPLY FIRST LAYER UNNA BOOT AT BASES OF TOES AND JUST BELOW THE KNEE TO HOLD COMPRESSION WRAP IN PLACE. WOUND #7: - Lower Leg Wound Laterality: Right, Posterior Cleanser: Soap and Water 1 x  Per Week/30 Days Discharge Instructions: May shower and wash wound with dial antibacterial soap and water prior to dressing change. Peri-Wound Care: Sween Lotion (Moisturizing lotion) 1 x Per Week/30 Days Discharge Instructions: Apply moisturizing lotion as directed Topical: Skintegrity Hydrogel 4 (oz) 1 x Per Week/30 Days Discharge Instructions: Apply hydrogel with sorbact Prim Dressing: PolyMem Silver Non-Adhesive Dressing, 4.25x4.25 in 1 x Per Week/30 Days ary Discharge Instructions: Apply to wound bed as instructed Secondary Dressing: ABD Pad, 8x10 1 x Per Week/30 Days Discharge Instructions: Apply over primary dressing as directed. Secondary Dressing: Woven Gauze Sponge, Non-Sterile 4x4 in 1 x Per Week/30 Days Discharge Instructions: Apply over primary dressing as directed. Com pression Wrap: Kerlix Roll 4.5x3.1 (in/yd) 1 x Per Week/30 Days Discharge Instructions: Apply Kerlix and Coban compression as directed. Com pression Wrap: Coban Self-Adherent Wrap 4x5 (in/yd) 1 x Per Week/30 Days Discharge Instructions: Apply over Kerlix as directed. Com pression Wrap: unna boot FIRST LAYER **** SEE INSTRUCTIONS 1 x Per Week/30 Days Discharge Instructions: APPLY FIRST LAYER UNNA BOOT AT BASES OF TOES AND JUST BELOW THE KNEE TO HOLD COMPRESSION WRAP IN PLACE. 1. #1 we are still using polymen silver under compression. 2 she is making continuously good improvement every week in fact the lateral  wound will be healed I think by the next time we see her in the posterior 1 also is much smaller. 3 I see no reason for the amount of pain she describes. There is no evidence of infection, muscle or Achilles tendon repair Electronic Signature(s) Signed: 04/01/2023 11:51:18 AM By: [name] MD Entered By: [name] on 04/01/2023 08:26:07 SuperBill Details -------------------------------------------------------------------------------- [name] ([id number]) [id number] Page 11 of 11 Patient Name: Date of Service: Jamie [name], [place] 04/01/2023 Medical Record Number: [id number] Patient Account Number: [id number] Date of Birth/Sex: Treating RN: 12/11/1940 (82 y.o. Orville Govern Primary Care Provider: Belva Agee Other Clinician: Referring Provider: Treating Provider/Extender: Elby Beck in Treatment: 44 Diagnosis Coding ICD-10 Codes Code Description [phone] Chronic venous hypertension (idiopathic) with ulcer and inflammation of right lower extremity 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d I25.10 Atherosclerotic heart disease of native coronary artery without angina pectoris Facility Procedures CPT4 Code Description Modifier Quantity [id number] - WOUND CARE VISIT-LEV 3 EST PT 1 Physician Procedures Quantity CPT4 Code Description Modifier [id number] - WC PHYS LEVEL 3 - EST PT 1 ICD-10 Diagnosis Description I87.331 Chronic venous hypertension (idiopathic) with ulcer and inflammation of right lower extremity 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d Electronic Signature(s) Signed: 04/01/2023 11:51:18 AM By: [name] MD Entered By: [name] on 04/01/2023 52:84:13

## 2023-04-10 ENCOUNTER — Encounter (HOSPITAL_BASED_OUTPATIENT_CLINIC_OR_DEPARTMENT_OTHER): Payer: Medicare HMO | Attending: Internal Medicine | Admitting: Internal Medicine

## 2023-04-10 DIAGNOSIS — L97812 Non-pressure chronic ulcer of other part of right lower leg with fat layer exposed: Secondary | ICD-10-CM | POA: Insufficient documentation

## 2023-04-10 DIAGNOSIS — I2510 Atherosclerotic heart disease of native coronary artery without angina pectoris: Secondary | ICD-10-CM | POA: Insufficient documentation

## 2023-04-10 DIAGNOSIS — I87331 Chronic venous hypertension (idiopathic) with ulcer and inflammation of right lower extremity: Secondary | ICD-10-CM | POA: Insufficient documentation

## 2023-04-11 NOTE — Progress Notes (Signed)
 Jamie [name], Jamie [name] ([id number]) [id number] Page 1 of 9 Visit Report for 04/10/2023 Arrival Information Details Patient Name: Date of Service: Jamie [name], Jamie [name]. 04/10/2023 10:45 A M Medical Record Number: [id number] Patient Account Number: [id number] Date of Birth/Sex: Treating RN: [name] (83 y.o. Jamie [name] Primary Care Jamie [name]: Jamie [name] Other Clinician: Referring Jamie [name]: Treating Jamie [name]/Extender: Jamie [name] Jamie [name] Jamie in Treatment: 46 Visit Information History Since Last Visit Added or deleted any medications: No Patient Arrived: Wheel Chair Any new allergies or adverse reactions: No Arrival Time: 10:59 Had a fall or experienced change in No Accompanied By: Daughter activities of daily living that may affect Transfer Assistance: None risk of falls: Patient Identification Verified: Yes Signs or symptoms of abuse/neglect since last visito No Secondary Verification Process Completed: Yes Hospitalized since last visit: No Patient Requires Transmission-Based Precautions: No Implantable device outside of the clinic excluding No Patient Has Alerts: No cellular tissue based products placed in the center since last visit: Has Dressing in Place as Prescribed: Yes Has Compression in Place as Prescribed: Yes Pain Present Now: Yes Electronic Signature(s) Signed: 04/10/2023 4:27:30 PM By: Jamie Sailors RN, BSN Entered By: Jamie [name] on 04/10/2023 11:00:01 -------------------------------------------------------------------------------- Clinic Level of Care Assessment Details Patient Name: Date of Service: Jamie [name], Jamie [name]. 04/10/2023 10:45 A M Medical Record Number: [id number] Patient Account Number: [id number] Date of Birth/Sex: Treating RN: Jamie [name] (83 y.o. Jamie Jamie [name] Primary Care Jamie [name]: Jamie [name] Other Clinician: Referring Jamie [name]: Treating Jamie [name]/Extender: Jamie [name] Jamie [name] Jamie in  Treatment: 46 Clinic Level of Care Assessment Items TOOL 4 Quantity Score X- 1 0 Use when only an EandM is performed on FOLLOW-UP visit ASSESSMENTS - Nursing Assessment / Reassessment X- 1 10 Reassessment of Co-morbidities (includes updates in patient status) X- 1 5 Reassessment of Adherence to Treatment Plan ASSESSMENTS - Wound and Skin A ssessment / Reassessment []  - 0 Simple Wound Assessment / Reassessment - one wound X- 2 5 Complex Wound Assessment / Reassessment - multiple wounds X- 1 10 Dermatologic / Skin Assessment (not related to wound area) ASSESSMENTS - Focused Assessment X- 1 5 Circumferential Edema Measurements - multi extremities X- 1 10 Nutritional Assessment / Counseling / Intervention Jamie [name] ([id number]) [id number] Page 2 of 9 []  - 0 Lower Extremity Assessment (monofilament, tuning fork, pulses) []  - 0 Peripheral Arterial Disease Assessment (using hand held doppler) ASSESSMENTS - Ostomy and/or Continence Assessment and Care []  - 0 Incontinence Assessment and Management []  - 0 Ostomy Care Assessment and Management (repouching, etc.) PROCESS - Coordination of Care []  - 0 Simple Patient / Family Education for ongoing care X- 1 20 Complex (extensive) Patient / Family Education for ongoing care X- 1 10 Staff obtains Chiropractor, Records, T Results / Process Orders est X- 1 10 Staff telephones HHA, Nursing Homes / Clarify orders / etc []  - 0 Routine Transfer to another Facility (non-emergent condition) []  - 0 Routine Hospital Admission (non-emergent condition) []  - 0 New Admissions / Manufacturing Engineer / Ordering NPWT Apligraf, etc. , []  - 0 Emergency Hospital Admission (emergent condition) []  - 0 Simple Discharge Coordination X- 1 15 Complex (extensive) Discharge Coordination PROCESS - Special Needs []  - 0 Pediatric / Minor Patient Management []  - 0 Isolation Patient Management []  - 0 Hearing /  Language / Visual special needs []  - 0 Assessment of Community assistance (transportation, D/C planning, etc.) []  - 0 Additional assistance / Altered mentation []  - 0 Support Surface(s) Assessment (bed, cushion, seat, etc.) INTERVENTIONS -  Wound Cleansing / Measurement []  - 0 Simple Wound Cleansing - one wound X- 2 5 Complex Wound Cleansing - multiple wounds X- 1 5 Wound Imaging (photographs - any number of wounds) []  - 0 Wound Tracing (instead of photographs) []  - 0 Simple Wound Measurement - one wound X- 2 5 Complex Wound Measurement - multiple wounds INTERVENTIONS - Wound Dressings []  - 0 Small Wound Dressing one or multiple wounds []  - 0 Medium Wound Dressing one or multiple wounds []  - 0 Large Wound Dressing one or multiple wounds []  - 0 Application of Medications - topical []  - 0 Application of Medications - injection INTERVENTIONS - Miscellaneous []  - 0 External ear exam []  - 0 Specimen Collection (cultures, biopsies, blood, body fluids, etc.) []  - 0 Specimen(s) / Culture(s) sent or taken to Lab for analysis []  - 0 Patient Transfer (multiple staff / Nurse, Adult / Similar devices) []  - 0 Simple Staple / Suture removal (25 or less) []  - 0 Complex Staple / Suture removal (26 or more) []  - 0 Hypo / Hyperglycemic Management (close monitor of Blood Glucose) Jamie [name], Jamie [name] ([id number]) [id number] Page 3 of 9 []  - 0 Ankle / Brachial Index (ABI) - do not check if billed separately X- 1 5 Vital Signs Has the patient been seen at the hospital within the last three years: Yes Total Score: 135 Level Of Care: New/Established - Level 4 Electronic Signature(s) Signed: 04/11/2023 12:56:04 PM By: Jamie Nestle RN, BSN Entered By: Jamie [name] on 04/10/2023 11:10:32 -------------------------------------------------------------------------------- Lower Extremity Assessment Details Patient Name: Date of Service: Jamie Jamie [name].  04/10/2023 10:45 A M Medical Record Number: [id number] Patient Account Number: [id number] Date of Birth/Sex: Treating RN: [name] (83 y.o. Jamie [name] Primary Care Sloan Takagi: Jamie [name] Other Clinician: Referring Heriberto Stmartin: Treating Catalino Plascencia/Extender: Jamie [name] Jamie [name] Jamie in Treatment: 46 Edema Assessment Assessed: Colletta: No] Glenis: No] Edema: [Left: N] [Right: o] Calf Left: Right: Point of Measurement: From Medial Instep 37 cm Ankle Left: Right: Point of Measurement: From Medial Instep 24 cm Vascular Assessment Pulses: Dorsalis Pedis Palpable: [Right:Yes] Extremity colors, hair growth, and conditions: Extremity Color: [Right:Normal] Hair Growth on Extremity: [Right:No] Temperature of Extremity: [Right:Warm] Capillary Refill: [Right:< 3 seconds] Dependent Rubor: [Right:No No] Electronic Signature(s) Signed: 04/10/2023 4:27:30 PM By: Jamie Sailors RN, BSN Entered By: Jamie [name] on 04/10/2023 11:01:35 -------------------------------------------------------------------------------- Multi Wound Chart Details Patient Name: Date of Service: Jamie Jamie, HONOR CREASIE W. 04/10/2023 10:45 A M Medical Record Number: [id number] Patient Account Number: [id number] Date of Birth/Sex: Treating RN: [birth year] [name] (83 y.o. 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 N/A Primary Etiology: Sleep Apnea, Hypertension, Peripheral Sleep Apnea,  Hypertension, Peripheral N/A Comorbid History: Venous Disease, Osteoarthritis, Venous Disease, Osteoarthritis, Neuropathy Neuropathy 04/17/2022 01/01/2023 N/A Date Acquired: 46 14 N/A Weeks of Treatment: Open Open N/A Wound Status: No No N/A Wound Recurrence: Yes No N/A Clustered Wound: 0x0x0 1.2x1.2x0.1 N/A Measurements L x W x D (cm) 0 1.131 N/A A (cm) : rea 0 0.113 N/A Volume (cm) : 100.00% 90.40% N/A % Reduction in Area: 100.00% 95.20% N/A % Reduction in Volume: Full  Thickness Without Exposed Full Thickness Without Exposed N/A Classification: Support Structures Support Structures None Present Medium N/A Exudate Amount: N/A Serosanguineous N/A Exudate Type: N/A red, brown N/A Exudate Color: Thickened Thickened N/A Wound Margin: None Present (0%) Large (67-100%) N/A Granulation Amount: N/A Red, Pink N/A Granulation Quality: None Present (0%) Small (1-33%) N/A Necrotic Amount: Fascia: No Fat Layer (Subcutaneous Tissue): Yes N/A Exposed Structures: Fat Layer (Subcutaneous Tissue): No Fascia: No Tendon: No Tendon: No Muscle: No Muscle: No Joint: No Joint: No Bone: No Bone: No Large (67-100%) Large (67-100%) N/A Epithelialization: Scarring: Yes Excoriation: No N/A Periwound Skin Texture: Excoriation: No Induration: No Induration: No Callus: No Callus: No Crepitus: No Crepitus: No Rash: No Rash: No Scarring: No Maceration: No Dry/Scaly: Yes N/A Periwound Skin Moisture: Dry/Scaly: No Maceration: No Hemosiderin Staining: Yes Atrophie Blanche: No N/A Periwound Skin Color: Atrophie Blanche: No Cyanosis: No Cyanosis: No Ecchymosis: No Ecchymosis: No Erythema: No Erythema: No Hemosiderin Staining: No Mottled: No Mottled: No Pallor: No Pallor: No Rubor: No Rubor: No No Abnormality No Abnormality N/A Temperature: Yes Yes N/A Tenderness on Palpation: Treatment Notes Electronic Signature(s) Signed: 04/10/2023 4:03:36 PM By: Jamie Sharper MD Entered By: Jamie [name] on 04/10/2023 11:14:24 Jamie [name] ([id number]) [id number] Page 5 of 9 -------------------------------------------------------------------------------- Multi-Disciplinary Care Plan Details Patient Name: Date of Service: Jamie [name], Jamie [name]. 04/10/2023 10:45 A M Medical Record Number: [id number] Patient Account Number: [id number] Date of Birth/Sex: Treating RN: [name] (83 y.o. Jamie Jamie [name], Jamie [name] Primary Care Jiaire Rosebrook: Jamie [name] Other Clinician: Referring Dayanis Bergquist: Treating Brittish Bolinger/Extender: Jamie [name] Jamie [name] Jamie in Treatment: 46 Multidisciplinary Care Plan reviewed with physician Active Inactive Pain, Acute or Chronic Nursing Diagnoses: Pain Management - Cyclic Acute (Dressing Change Related) Pain Management - Non-cyclic Acute (Procedural) Pain, acute or chronic: actual or potential Goals: Patient will verbalize adequate pain control and receive pain control interventions during procedures as needed Date Initiated: 09/11/2022 Target Resolution Date: 05/09/2023 Goal Status: Active Patient/caregiver will verbalize adequate pain control between visits Date Initiated: 09/11/2022 Target Resolution Date: 05/09/2023 Goal Status: Active Patient/caregiver will verbalize comfort level met Date Initiated: 09/11/2022 Target Resolution Date: 05/09/2023 Goal Status: Active Interventions: Complete pain assessment as per visit requirements Encourage patient to take pain medications as prescribed Provide education on pain management Provision of support: recognize patient pain, provide comfort and support as needed Reposition patient for comfort Treatment Activities: Administer pain control measures as ordered : 09/11/2022 Notes: Electronic Signature(s) Signed: 04/11/2023 12:56:04 PM By: Jamie [name] Armida RN, BSN Entered By: Jamie [name] Jamie [name] on 04/10/2023  11:09:13 -------------------------------------------------------------------------------- Pain Assessment Details Patient Name: Date of Service: Jamie [name] HONOR CREASIE W. 04/10/2023 10:45 A M Medical Record Number: [id number] Patient Account Number: [id number] Date of Birth/Sex: Treating RN: [name] (83 y.o. Jamie [name] Primary Care Siniya Lichty: Jamie [name] Other Clinician: Referring Elsey Holts: Treating Andie Mortimer/Extender: Jamie [name] Jamie [name] Jamie in Treatment: 46 Active Problems Location of Pain Severity and Description of Pain Patient Has Jamie [name], Jamie [name] ([id number]) [id number] Page 6 of 9 Patient Has Paino Yes Site Locations Rate the pain. Current Pain Level: 6 Pain Management and Medication Current Pain Management: Electronic Signature(s) Signed: 04/10/2023 4:27:30 PM By: Jamie Sailors RN, BSN Entered By: Jamie [name] on 04/10/2023 11:01:22 -------------------------------------------------------------------------------- Patient/Caregiver Education Details Patient Name: Date of Service: Jamie [name] HONOR CREASIE MICAEL 1/2/2025andnbsp10:45 A M Medical Record Number: [id number] Patient Account Number: [id number] Date of Birth/Gender: Treating RN: [birth year] [name] (83 y.o. Jamie Jamie [name] Jamie [name] Primary Care Physician: Jamie [name] Other Clinician: Referring Physician: Treating Physician/Extender:  Jamie [name] Jamie Devere Jamie in Treatment: 46 Education Assessment Education Provided To: Patient Education Topics Provided Pain: Handouts: A Guide to Pain Control Methods: Explain/Verbal Responses: Reinforcements needed Electronic Signature(s) Signed: 04/11/2023 12:56:04 PM By: Jamie Nestle RN, BSN Entered By: Jamie [name] on 04/10/2023 11:09:24 -------------------------------------------------------------------------------- Wound Assessment Details Patient Name: Date of Service: Jamie Jamie HONOR CREASIE W. 04/10/2023 10:45 A M Medical Record  Number: [id number] Patient Account Number: [id number] JOELLYN, GRANDT ([id number]) [id number] Page 7 of 9 Date of Birth/Sex: Treating RN: [birth year] [name] (83 y.o. Jamie [name] Primary Care Dorla Guizar: Jamie Devere Other Clinician: Referring Zackry Deines: Treating Hadlea Furuya/Extender: Jamie [name] Jamie Devere Jamie in Treatment: 46 Wound Status Wound Number: 5 Primary Venous Leg Ulcer Etiology: Wound Location: Right, Lateral Lower Leg Wound Open Wounding Event: Trauma Status: Date Acquired: 04/17/2022 Comorbid Sleep Apnea, Hypertension, Peripheral Venous Disease, Weeks Of Treatment: 46 History: Osteoarthritis, Neuropathy Clustered Wound: Yes Photos Wound Measurements Length: (cm) Width: (cm) Depth: (cm) Area: (cm) Volume: (cm) 0 % Reduction in Area: 100% 0 % Reduction in Volume: 100% 0 Epithelialization: Large (67-100%) 0 Tunneling: No 0 Undermining: No Wound Description Classification: Full Thickness Without Exposed Support Structures Wound Margin: Thickened Exudate Amount: None Present Foul Odor After Cleansing: No Slough/Fibrino No Wound Bed Granulation Amount: None Present (0%) Exposed Structure Necrotic Amount: None Present (0%) Fascia Exposed: No Fat Layer (Subcutaneous Tissue) Exposed: No Tendon Exposed: No Muscle Exposed: No Joint Exposed: No Bone Exposed: No Periwound Skin Texture Texture Color No Abnormalities Noted: No No Abnormalities Noted: No Callus: No Atrophie Blanche: No Crepitus: No Cyanosis: No Excoriation: No Ecchymosis: No Induration: No Erythema: No Rash: No Hemosiderin Staining: Yes Scarring: Yes Mottled: No Pallor: No Moisture Rubor: No No Abnormalities Noted: No Dry / Scaly: No Temperature / Pain Maceration: No Temperature: No Abnormality Tenderness on Palpation: Yes Electronic Signature(s) Signed: 04/10/2023 4:27:30 PM By: Jamie Sailors RN, BSN Entered By: Jamie [name] on 04/10/2023  10:56:43 Jamie [name] ([id number]) [id number] Page 8 of 9 -------------------------------------------------------------------------------- Wound Assessment Details Patient Name: Date of Service: Jamie Jamie, Jamie [name]. 04/10/2023 10:45 A M Medical Record Number: [id number] Patient Account Number: [id number] Date of Birth/Sex: Treating RN: [name][birth year] (83 y.o. Jamie [name] Primary Care Shawntell Dixson: Jamie Devere Other Clinician: Referring Berlin Mokry: Treating Cora Stetson/Extender: Jamie [name] Jamie Devere Jamie in Treatment: 46 Wound Status Wound Number: 7 Primary Venous Leg Ulcer Etiology: Wound Location: Right, Posterior Lower Leg Wound Open Wounding Event: Gradually Appeared Status: Date Acquired: 01/01/2023 Comorbid Sleep Apnea, Hypertension, Peripheral Venous Disease, Weeks Of Treatment: 14 History: Osteoarthritis, Neuropathy Clustered Wound: No Photos Wound Measurements Length: (cm) 1. Width: (cm) 1. Depth: (cm) 0. Area: (cm) 1 Volume: (cm) 0 2 % Reduction in Area: 90.4% 2 % Reduction in Volume: 95.2% 1 Epithelialization: Large (67-100%) .131 Tunneling: No .113 Undermining: No Wound Description Classification: Full Thickness Without Exposed Support Wound Margin: Thickened Exudate Amount: Medium Exudate Type: Serosanguineous Exudate Color: red, brown Structures Foul Odor After Cleansing: No Slough/Fibrino Yes Wound Bed Granulation Amount: Large (67-100%) Exposed Structure Granulation Quality: Red, Pink Fascia Exposed: No Necrotic Amount: Small (1-33%) Fat Layer (Subcutaneous Tissue) Exposed: Yes Necrotic Quality: Adherent Slough Tendon Exposed: No Muscle Exposed: No Joint Exposed: No Bone Exposed: No Periwound Skin Texture Texture Color No Abnormalities Noted: No No Abnormalities Noted: No Callus: No Atrophie Blanche: No Crepitus: No Cyanosis: No Excoriation: No Ecchymosis: No Induration: No Erythema: No Rash:  No Hemosiderin Staining: No Scarring: No Mottled: No Pallor: No Moisture Rubor: No No Abnormalities Noted: No Dry /  Scaly: Yes Temperature / Pain Maceration: No Temperature: No Abnormality Tenderness on Palpation: [address] [place], [place] ([id number]) [id number] Page 9 of 9 Electronic Signature(s) Signed: 04/10/2023 4:27:30 PM By: Jamie Sailors RN, BSN Entered By: Jamie [name] on 04/10/2023 10:59:29 -------------------------------------------------------------------------------- Vitals Details Patient Name: Date of Service: Jamie SAILOR, MA [name] 04/10/2023 10:45 A M Medical Record Number: [id number] Patient Account Number: [id number] Date of Birth/Sex: Treating RN: [name] (83 y.o. Jamie [name] Primary Care Yamila Cragin: Jamie [name] Other Clinician: Referring Garvey Westcott: Treating Vanda Waskey/Extender: Jamie [name] Jamie [name] Jamie in Treatment: 46 Vital Signs Time Taken: 10:47 Temperature (F): 98.4 Height (in): 62 Pulse (bpm): 48 Weight (lbs): 171 Respiratory Rate (breaths/min): 18 Body Mass Index (BMI): 31.3 Blood Pressure (mmHg): 154/85 Reference Range: 80 - 120 mg / dl Electronic Signature(s) Signed: 04/10/2023 4:27:30 PM By: Jamie Sailors RN, BSN Entered By: Jamie [name] on 04/10/2023 11:01:10

## 2023-04-11 NOTE — Progress Notes (Signed)
 Jamie [name] ([id number]) [id number] Page 1 of 10 Visit Report for 04/10/2023 HPI Details Patient Name: Date of Service: Jamie [name], [name]. 04/10/2023 10:45 A M Medical Record Number: [id number] Patient Account Number: [id number] Date of Birth/Sex: Treating RN: 1940-06-25 (83 y.o. F) Primary Care Provider: Devra [name] Other Clinician: Referring Provider: Treating Provider/Extender: Jamie [name] Jamie [name] Jamie in Treatment: 46 History of Present Illness HPI Description: ADMISSION 06/30/2020 Jamie [name] is an 83 year old woman who lives in [place] . She is here with her niece for review of wounds on the left medial lower leg and ankle. These have apparently been present for over a year and she followed with Jamie [name] at the [facility] in [place] for quite a period of time although it looks as though there was a initial consult wound from Jamie [name] on February 18 presumably there was therefore hiatus. At that point the wounds were described as being there for 3 months. She also tells me she was at the wound care center in [place] for a period of time with this. 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 She had venous studies that were negative for DVT ABIs on the right were 1.01 on the left 1.06. She has . had previous applications of puraply, compression which she does not tolerate very well. She has had several rounds of oral antibiotic therapy. She complains of unrelenting pain and she is seeing Jamie [name] of pain management apparently was on oxycodone but that did not help. She also had a skin biopsy done by Jamie [name] although we do not have that result. She does not appear to have an arterial issue. I am not completely clear what she has been putting on the wounds lately. 3/31; this patient has a particularly nasty set of wounds on the left medial ankle in the middle of what looks to be  hemosiderin deposition secondary to chronic venous insufficiency. She has a lot of pain followed by pain management. Jamie [name] apparently did a biopsy of something on the left leg last fall what I would like to get this result. She has a history of MRSA treatment. I do not believe she had reflux studies but she did have DVT rule out studies. Previous ABIs have not suggested arterial insufficiency 4/7; difficult wounds on the left medial ankle probably chronic venous insufficiency. With considerable effort on behalf of our case manager we were able to finally to speak to somebody at the hospital in [place] who indicated that no biopsy of this area have been done even though the patient describes this in some detail and is even able to point out where she thinks the biopsy was done. We have been using Sorbact. The PCR culture I did showed polymicrobial identification with Pseudomonas, staph aureus, Peptostreptococcus. All of this and low titers. Resistance genes identified were MRSA, staph virulence gene and tetracycline. We are going to send this to [facility] for a topical antibiotic which is something that we have had good success with recently in large venous ulcers with a lot of purulent drainage. There would not be an easy oral alternative here possibly line escalated and ciprofloxacin if we need to use systemic antibiotic 4/15; difficult area on the left medial ankle. Most likely chronic venous insufficiency. I think she will probably need venous reflux study I think she had DVT rule outs but not venous reflux studies. We have not yet obtained the topical antibiotics. She has  home health changing the dressing we have been using Sorbact for adherent fibrinous debris on the surface. Very difficult to remove 4/22; patient presents for 1 week follow-up. She has been using sore back under compression wraps and these are changed 3 times a week with home health. She also had Keystone antibiotics sent to her  house and brought them in today. She has no complaints or issues today. 5/2; patient is here for follow-up. She has been using Sorbact under compression. Very painful wound. She has been using Keystone antibiotics. Not much improvement although the more medial part of the wound has cleaned up nicely and the larger part of the wound about 50% slough covered. Part of the issue here is that she had stays at both [facility] wound care center, [facility] wound care center and now us . Not sure if she has had venous reflux studies. As far as we are able to tell she did not have a biopsy. My notes state that she did not have venous reflux studies just DVT rule outs. 5/16; patient goes for venous reflux studies this afternoon. She says that wound was biopsied which sounds like punch biopsies by Dr. Donny we do not have these results. We are using Sorbact. Very difficult wound to debride 5/23; patient presents for 1 week follow-up. She reports tolerating the wraps well with sorbact underneath. She had ABIs and venous reflux studies done. She has no issues or complaints today. She denies acute signs of infection. 6/6; I have reviewed the patient's vascular studies. Wounds are on the left medial and posterior calf. She had significant reflux in the greater saphenous vein in the in the mid thigh, distal thigh knee and the small saphenous vein in the popliteal fossa. The vein diameters do not look too impressive though. She is going to see the vascular surgeon on Wednesday. She also had venous reflux in the right common femoral vein. She did not have any evidence of a DVT or SVT I am wondering whether there is an ablation procedure that would benefit her in the greater saphenous vein on the left. . She tells us  that home health put the dressing on too tight and she took off 1 layer. The swelling in her left leg is a little worse as a result of this. Not much change in the wound measurements so the surface of the wound  looks better Her arterial studies showed an ABI on the right of 1.14 with a triphasic waveform and a great toe pressure of 0.89. On the left her ABIs were noncompressible at 1.34 but with triphasic waveforms and a TBI of 0.97. Her greater toe pressure was 122. There was no evidence of significant bilateral arterial disease 6/20 patient went to see Dr. Melvenia. He did not think she had significant arterial disease. In terms of her venous duplex on the right side there was no evidence of a DVT or SVT there was deep venous reflux involving the common femoral vein no superficial vein reflux on the left side there was no DVT or SVT there was no deep vein graft reflux there was some reflux in the greater saphenous vein from the mid thigh to the knee but the vein here was not dilated. He thought these were venous wounds he prescribed a compression pump but I am not sure who we ordered it from The patient has been approved for Apligraf. Still using silver collagen this week 7/5; Apligraf #1 7/19 Apligraf #2. Decent improvement in the condition of  the wound bed. Epithelialization distal 8/2 Apligraf #3. No issues or complaints. Denies signs of infection. 8/17 Apligraf #4. No issues or concerns. Some complaints of pruritus and the rash. Our intake nurse brought up the fact that she had previously indicated possible cotton layer sensitivity we will therefore use kerlix in the bottom layer of the compression 8/30; the patient comes in with the area on her left medial leg just about healed. There is a superficial area more towards the tibia and a smaller open area Jamie, [name] ([id number]) [id number] Page 2 of 10 distally everything else is epithelialized. I do not think she requires another Apligraf 9/13; left medial leg is healed. 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 Her edema control is  good Readmisstion: 05-22-2022 upon evaluation today patient presents for initial inspection here in our clinic concerning issues that she has been having with a wound of the right lateral lower extremity. This is an area of a previous skin graft she tells me. With that being said she unfortunately had a scrape on this that occurred around 10 January. Since that time she has noted that this has just continued to get bigger and bigger in her niece who is present with her today actually states that 2 weeks ago when she saw that it was significantly smaller than what she sees currently. Obviously this is of utmost concern as we do not want this to continue to get larger when arrested and get moving in the right direction. Fortunately there does not appear to be any signs of systemic infection though locally I think we probably do have some infection present. I would obtain a culture to see what we have going on here and then we will subsequently see where things go going forward. Fortunately I think that she is in the right place at this point being at the wound center we can definitely do something to try to get this moving in the right direction. Patient does have a history of chronic venous insufficiency as well as coronary artery disease. In the past she has done well with compression wraps on the start with a 3 layer wrap I think she is probably can end up going to a 4-layer at some point but we will see how things do over the next week. She has been using wound cleanser along with she tells me a zinc and topical but again I am not sure exactly what that was. 05-29-2022 upon evaluation today patient appears to be doing well currently in regard to her wound. This actually is showing signs of improvement I am happy in that regard unfortunately her left leg has an area on the shin that has opened. This is the region where one of the areas at least we have previously taken care of. Nonetheless this is  small hoping we get it under control before things worsen significantly. 06-05-2022 upon evaluation today patient appears to be doing well currently in regard to her wounds. The actually seem to be fairly clean she is having still quite a bit of problem with pain at this point she would like to not have debridement today for all possible. With that being said I do believe that we are making some good progress I think the Hydrofera Blue is doing a decent job here. 3/6; patient has had 3/6; patient with known severe chronic venous insufficiency. She apparently had a traumatic wound at home and has a  reasonably substantial wound on the right lateral lower leg and a smaller one on the left anterior lower leg as well. We have been using Hydrofera Blue and Unna boots 3/13; patient presents for follow-up. We have been using Hydrofera Blue under Coflex to the legs bilaterally. She has no issues or complaints today. 06-26-2022 upon evaluation today patient appears to be doing well currently in regard to her wound. This is measuring a little bit larger however. Fortunately I do not see any signs of infection this is on the right side. Fortunately the left side is actually completely healed which is great news. 07-03-2022 patient's wound unfortunately is continuing to show signs of being worse. Her compression which was the Tubigrip that I thought should be able to pull up actually slipped down and she was not able to pull that back up. With that being said that means that unfortunately her wound has continued to deteriorate since I last saw her. This is definitely not the direction that we are looking for. I discussed with her that I do believe we need to go ahead and see about getting her started on antibiotics and I subsequently would also like to go ahead and see about getting things moving forward with regard to a wound culture and making a change up her dressings as well but this can be changed more frequently  than just once a week. 07-10-2022 upon evaluation today patient actually appears to be doing much better. I do believe that the antibiotics have been beneficial for her. Fortunately I do not see any signs of active infection locally nor systemically which is great news. No fevers, chills, nausea, vomiting, or diarrhea. 07-17-2022 upon evaluation today patient appears to be doing well currently in regard to her wound which is actually showing signs of improvement this is slow but nonetheless prevalent that we are seeing good improvement here. Fortunately I do not see any signs of active infection locally nor systemically which is great news. 07-24-2022 upon evaluation today patient appears to be doing well currently in regard to her wound although the wrap that we had on has been slipping down and she really does not have anybody to help her with this at home health is not going to be coming out we could not get anybody that would actually be able to do the dressing changes. Fortunately I do not see any signs of active infection locally nor systemically which is great news. 08-07-22 poorly in regard to her leg compared to what it was previous. Fortunately there does not appear to be any signs of active infection locally nor systemically which is great news. No fevers, chills, nausea, vomiting, or diarrhea. With that being said it does appear that the patient may have some cellulitis in the leg which is not good. 08-14-2022 upon evaluation today patient appears to be doing somewhat better in regard to her wounds she still is having quite a bit of pain we are still not where we want to be as far as healing is concerned completely. Fortunately I do not see any evidence of active infection locally nor systemically which is great news and I am very pleased in that regard. 08-21-23 upon evaluation today patient appears to be doing poorly currently in regard to her wound. She has been tolerating the dressing changes  without complication. Fortunately there does not appear to be any signs of active infection locally nor systemically at this time. 08-28-2022 upon evaluation today patient appears to be doing  poorly in regard to her wound this was not wrapped appropriately home health did not go up high enough on the wrap. This has caused some issues and I discussed that with the patient today. I do believe that she is going to require aggressive wrapping and treatment which she is getting the [facility] topical antibiotics today they can start using that at the next wrap on Friday. In the meantime they need to make sure that the rapid and appropriately we wrote very specific orders today. 09-04-2022 upon evaluation today patient appears to be doing well currently in regard to her wound which I think is making progress is still hurting her quite significantly. Fortunately I do not see any signs of active infection locally or systemically which is great news. No fevers, chills, nausea, vomiting, or diarrhea. 09-11-2022 upon evaluation today patient's wound actually is showing signs of being a little bit smaller and looking a little bit better she still has a lot going on here however. Fortunately I do not see any evidence of active infection Worsening locally nor systemically which is great news. With that being said worsening still continue to use the topical Keystone antibiotics. 09-18-2022 upon evaluation today patient actually showing some signs of improvement. I am actually very pleased with where we stand compared to where we have been. I think that she is making good headway here. She is still having quite a bit of pain but it seems to be lessening compared to previous. 09-25-2022 upon evaluation patient's wound actually showing signs slowly but surely of improvement. Fortunately I do not see any signs of active infection at this time systemically and locally I feel like this is dramatically improved. She is doing well  with the [facility] topical antibiotics. 10-02-2022 upon evaluation today patient appears to be doing better in regard to her wound overall. I am very pleased with where things stand from a visual standpoint I do not see any signs of active infection and in general I think that we are moving in the right direction here. 10-09-2022 upon evaluation today patient appears to be doing well currently in regard to her wound. She has been tolerating the dressing changes without complication. Fortunately I do not see any signs of active infection at this time which is great news. 10-16-2022 upon evaluation today patient continues to have quite a bit of pain in regard to her right leg. We have been attempting to debride little by little as we could but again was pretty limited by the fact that she is having significant discomfort. We do not actually have any signs of active infection going on at this point that the [facility] topical antibiotics have been helping quite readily. I been attempting to do as much debridement as I can but if she were to have pain medication this would actually help and in the past when she did it was much more tolerable for her as far as taking the edge off. Right now she has been without as she is in transition from her previous pain management physician to someone new to manage this. Jamie, [name] ([id number]) [id number] Page 3 of 10 10-23-2022 upon evaluation patient appears to be doing excellent in regard to her wound. She has been tolerating the dressing changes without complication. Fortunately I do not see any evidence of active infection locally nor systemically which is great news. No fevers, chills, nausea, vomiting, or diarrhea. 7/24; patient with a wound secondary to chronic venous insufficiency on the right lateral  lower leg. We have been using Keystone antibiotic Hydrofera Blue under kerlix Coban. She complains of a lot of pain after last  week's debridement otherwise things appear to be improved per discussion with our intake nurse. 11-06-2022 upon evaluation today patient appears to be doing excellent at this point in regard to her wound. She has been tolerating the dressing changes without complication and to be honest she is making excellent progress towards complete closure. I am actually very pleased with where we stand I think that she is doing excellent as far as the overall appearance of the wound is concerned as well. She is going require some debridement however. 11-20-2022 upon evaluation today patient appears to be doing well currently in regard to her wound. She has been tolerating the dressing changes without complication. Fortunately there does not appear to be any signs of active infection locally or systemically which is great news and in general I do believe that we are moving in the right direction here. No fevers, chills, nausea, vomiting, or diarrhea. 11-27-2022 upon evaluation today patient appears to be doing a little better in regards to the overall appearance of her wound but unfortunately she is having increased pain. The collagen seems to be getting very dry and stuck to the wound bed which is causing her some issues here. Fortunately I do not see any signs of active infection locally nor systemically at this time. Fortunately I think that her wound is better but unfortunately her pain has not. For that reason I am going to avoid any debridement today since the patient is very upset about the pain and how bad this is hurting at this point. I think we can have to try something a little bit different I am thinking PolyMem may be a good option. 8/28; this patient has difficult venous wounds on the right lateral lower leg and ankle in the setting of chronic venous insufficiency. We have been using polymen under compression with kerlix Coban. 12-11-2022 upon evaluation today patient appears to be doing well currently in  regard to her wound. She has been tolerating the dressing changes without complication and actually appears to be doing much better this week compared to when I saw her 2 weeks ago. The wound is measuring significantly smaller. We are using PolyMem along with Kerlix and Coban. 12-18-2022 upon evaluation today patient appears to be doing well currently in regard to her wound. This is actually showing signs of improvement is measuring a little bit smaller and looking better. Fortunately I do not see any evidence of worsening overall and I believe that we will making good headway with the PolyMem and the [facility] topical antibiotics. 12-25-2022 upon evaluation today patient appears to be doing well currently in regard to her wound. She has been tolerating the dressing changes without complication. Fortunately I do not see any evidence of infection locally or systemically which is great news and in general I do believe that we will make an excellent headway towards complete closure also excellent news. 01-01-2023 upon evaluation patient's wound actually showing signs of improvement the wrap was actually put on properly this week and this is good news. Overall I am extremely happy with where things stand and how this appears today I do not see any signs of worsening overall and I believe that the patient is making excellent headway towards complete closure which is great news. 01/08/2023 upon evaluation today patient appears to be doing well currently in regard to her leg. She is  actually draining much less unfortunately home health is just not doing very well getting the supplies necessary in order to continue to take care of the patient. They are now telling me they cannot get the PolyMem which is something that has been doing really well for her. That really does not make sense to me you came to get PolyMem for a hospice patient. Nonetheless either way I think we may need to just discontinue treatment with  home health and just manage the patient here at the clinic. 01-15-2023 upon evaluation today patient appears to be doing well currently in regard to her wound. She has been tolerating the dressing changes without complication. Fortunately I do not see any signs of infection I think she is doing quite well and very pleased with where we stand today. No fevers, chills, nausea, vomiting, or diarrhea. 01-22-2023 upon evaluation today patient appears to be doing well currently in regard to her wound. She has been tolerating the dressing changes without complication and both wounds are measuring smaller today and look to be doing excellent. I am actually very pleased with what ever standing here and I think that she is making really good headway towards closure which is great news. 01-29-2023 upon evaluation today patient actually tells me she has been having some increased pain. Subsequently it took a little bit of drilling down but in the end I realized that we actually ended up putting a full Unna boot on her last week as opposed to just using a good anchor at the top and bottom. I think this is what wound up with her having increased discomfort because she tells me as she was doing well when she came in last time and by the time she left she tells me this was already getting significantly worse. And she hurt most of the week. With that being said I do believe that the patient is doing quite well at this point with regard to her wounds and the wrap did okay it was not a problem other than the increased pain I think we may benefit from a Urgo K2 light compression wrap as this will be much more comfortable I think compared to what we have been doing with just the Kerlix Coban anyway. 02-05-2023 upon evaluation today patient appears to be doing well currently in regard to her wounds. She has been tolerating the dressing changes without complication. Fortunately I do not see any signs of active infection  looking systemically which is great news. No fevers, chills, nausea, vomiting, or diarrhea. 11/6; patient I remember from a previous stay in this clinic With a left lower extremity wound.. She has a right lower extremity venous wound. She has been using Keystone, polymen under [employer]. Her wound now is to remaining open areas. There is no evidence of infection although she is tender in the area. I wonder whether there is stasis dermatitis here. She reminds me that she had some form of skin graft on this area during his stay at the [facility] wound care center presumably in around 2022. 11/13; difficult wound on the right lateral lower extremity. She has a history of venous wounds in this area and a history of a skin graft in this area. She can planes about a lot of pain but she has not been systemically unwell. No fever chills etc. 11/20; continue difficult wounds on the right lateral lower extremity. She has a history of venous wounds in this area and a history of skin  graft in this area. She continues to complain of pain. She comes in today with completely nonviable surfaces. She has been using Keystone polymen under and Urgo K2 light 12/3; significant improvement in wound dimensions still 2 open wounds which were initially part of the larger single wound. We have been using Keystone polymen under Urgo K2 lite. The wounds are smaller today. The surface does not look as viable as I might like although she does not tolerate mechanical debridement at all 12/11; 2 open wounds 1 on the right anterior and 1 on the lateral lower leg. We have been using Keystone polymen under Urgo K2 lite's. The wounds are measuring smaller. 12/18; right anterior and right lateral lower leg.. 2 open areas. Somewhat smaller we have been using Keystone polymen under Urgo K2 lite's. The surrounding area is mostly scar tissue from previous skin grafts in this area. 1. We are going to continue with the polymen Ag ABDs and Urgo  K2's. He is doing nicely on both sides. 2. Again we talked about compression stockings when these have healed. I think he is going to require 30/40 mm of equivalent compression. Juxta lite stockings would be a good alternative but he seemed more interested in standard over the toe stockings 04/10/2023. The more anterior wound has completely closed and this posterior wound is down half the size. We have been using polymen Ag under compression.Jamie [name] She will need stockings I am not sure which she has when this closes which may be as early as the next week or 2 Jamie [name], ORDAZ ([id number]) [id number] Page 4 of 10 Electronic Signature(s) Signed: 04/10/2023 4:03:36 PM By: Jamie Sharper MD Entered By: Jamie Sharper on 04/10/2023 11:16:09 -------------------------------------------------------------------------------- Physical Exam Details Patient Name: Date of Service: Jamie [name], Jamie CREASIE [name]. 04/10/2023 10:45 A M Medical Record Number: [id number] Patient Account Number: [id number] Date of Birth/Sex: Treating RN: 1940-10-11 (83 y.o. F) Primary Care Provider: Devra [name] Other Clinician: Referring Provider: Treating Provider/Extender: Jamie Sharper Jamie [name] Jamie in Treatment: 46 Constitutional Sitting or standing Blood Pressure is within target range for patient.. Pulse regular and within target range for patient.Jamie [name] Respirations regular, non-labored and within target range.. Temperature is normal and within the target range for the patient.Jamie [name] Appears in no distress. Notes Wound exam; the lateral wound is healed. Still 1 open area posteriorly. This looks really quite good. No debridement is required. She still complains of discomfort in this area but there is no palpable tenderness or observable a erythema. Electronic Signature(s) Signed: 04/10/2023 4:03:36 PM By: Jamie Sharper MD Entered By: Jamie Sharper on 04/10/2023  11:16:55 -------------------------------------------------------------------------------- Physician Orders Details Patient Name: Date of Service: Jamie [name], Jamie CREASIE W. 04/10/2023 10:45 A M Medical Record Number: [id number] Patient Account Number: [id number] Date of Birth/Sex: Treating RN: October 14, 1940 (83 y.o. Jamie [name] Primary Care Provider: Devra [name] Other Clinician: Referring Provider: Treating Provider/Extender: Jamie Sharper Jamie [name] Jamie in Treatment: 46 The following information was scribed by: Drury [name] The information was scribed for: Jamie Sharper Verbal / Phone Orders: No Diagnosis Coding Follow-up Appointments ppointment in 1 week. - 04/16/2023 1115 AM (already scheduled) Return A ppointment in 2 weeks. - 04/23/2023 111 (already scheduled) Return A Other: - you will need to wear compression stockings for right leg once it heals. Bathing/ Shower/ Hygiene May shower and wash wound with soap and water. - with dressing changes. Edema Control - Orders / Instructions Elevate legs to the level of the heart or above for 30 minutes daily and/or  when sitting for 3-4 times a day throughout the day. Avoid standing for long periods of time. Exercise regularly - As tolerated Wound Treatment Wound #7 - Lower Leg Wound Laterality: Right, Posterior Cleanser: Soap and Water 1 x Per Week/30 Days Discharge Instructions: May shower and wash wound with dial antibacterial soap and water prior to dressing change. Peri-Wound Care: Sween Lotion (Moisturizing lotion) 1 x Per Week/30 Days Discharge Instructions: Apply moisturizing lotion as directed Jamie, [name] ([id number]) [id number] Page 5 of 10 Prim Dressing: PolyMem Silver Non-Adhesive Dressing, 4.25x4.25 in 1 x Per Week/30 Days ary Discharge Instructions: Apply to wound bed as instructed Secondary Dressing: ABD Pad, 8x10 1 x Per Week/30 Days Discharge Instructions: Apply over primary  dressing as directed. Secondary Dressing: Woven Gauze Sponge, Non-Sterile 4x4 in 1 x Per Week/30 Days Discharge Instructions: Apply over primary dressing as directed. Compression Wrap: Kerlix Roll 4.5x3.1 (in/yd) 1 x Per Week/30 Days Discharge Instructions: Apply Kerlix and Coban compression as directed. Compression Wrap: Coban Self-Adherent Wrap 4x5 (in/yd) 1 x Per Week/30 Days Discharge Instructions: Apply over Kerlix as directed. Compression Wrap: unna boot FIRST LAYER **** SEE INSTRUCTIONS 1 x Per Week/30 Days Discharge Instructions: APPLY FIRST LAYER UNNA BOOT AT BASES OF TOES AND JUST BELOW THE KNEE TO HOLD COMPRESSION WRAP IN PLACE. Electronic Signature(s) Signed: 04/10/2023 4:03:36 PM By: Jamie Sharper MD Signed: 04/11/2023 12:56:04 PM By: Drury Nestle RN, BSN Entered By: Drury [name] on 04/10/2023 11:09:07 -------------------------------------------------------------------------------- Problem List Details Patient Name: Date of Service: Jamie [name], Jamie BORER W. 04/10/2023 10:45 A M Medical Record Number: [id number] Patient Account Number: [id number] Date of Birth/Sex: Treating RN: [birth year] (83 y.o. 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 05/22/2022 No Yes lower extremity L97.812 Non-pressure chronic ulcer of other part of right lower leg with fat layer 05/22/2022 No Yes exposed I25.10 Atherosclerotic heart disease of native coronary artery without angina pectoris 05/22/2022 No Yes Inactive Problems Resolved Problems Electronic Signature(s) Signed: 04/10/2023 4:03:36 PM By: Jamie Sharper MD Entered By: Jamie Sharper on 04/10/2023 11:14:15 Jamie [name] ([id number])  [id number] Page 6 of 10 -------------------------------------------------------------------------------- Progress Note Details Patient Name: Date of Service: Jamie [name], [name]. 04/10/2023 10:45 A M Medical Record Number: [id number] Patient Account Number: [id number] Date of Birth/Sex: Treating RN: 02-18-1941 (83 y.o. F) Primary Care Provider: Devra [name] Other Clinician: Referring Provider: Treating Provider/Extender: Jamie Sharper Jamie [name] Jamie in Treatment: 46 Subjective History of Present Illness (HPI) ADMISSION 06/30/2020 Mrs. Barcia is an 83 year old woman who lives in [place] . She is here with her niece for review of wounds on the left medial lower leg and ankle. These have apparently been present for over a year and she followed with Jamie [name] at the [facility] in [place] for quite a period of time although it looks as though there was a initial consult wound from Jamie [name] on February 18 presumably there was therefore hiatus. At that point the wounds were described as being there for 3 months. She also tells me she was at the wound care center in [place] for a period of time with this. 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 She had venous studies that were negative for DVT ABIs on the right were 1.01 on the left 1.06. She has .  had previous applications of puraply, compression which she does not tolerate very well. She has had several rounds of oral antibiotic therapy. She complains of unrelenting pain and she is seeing Jamie [name] of pain management apparently was on oxycodone but that did not help. She also had a skin biopsy done by Jamie [name] although we do not have that result. She does not appear to have an arterial issue. I am not completely clear what she has been putting on the wounds lately. 3/31; this patient has a particularly nasty set of wounds on the left medial ankle  in the middle of what looks to be hemosiderin deposition secondary to chronic venous insufficiency. She has a lot of pain followed by pain management. Jamie [name] apparently did a biopsy of something on the left leg last fall what I would like to get this result. She has a history of MRSA treatment. I do not believe she had reflux studies but she did have DVT rule out studies. Previous ABIs have not suggested arterial insufficiency 4/7; difficult wounds on the left medial ankle probably chronic venous insufficiency. With considerable effort on behalf of our case manager we were able to finally to speak to somebody at the hospital in [place] who indicated that no biopsy of this area have been done even though the patient describes this in some detail and is even able to point out where she thinks the biopsy was done. We have been using Sorbact. The PCR culture I did showed polymicrobial identification with Pseudomonas, staph aureus, Peptostreptococcus. All of this and low titers. Resistance genes identified were MRSA, staph virulence gene and tetracycline. We are going to send this to [facility] for a topical antibiotic which is something that we have had good success with recently in large venous ulcers with a lot of purulent drainage. There would not be an easy oral alternative here possibly line escalated and ciprofloxacin if we need to use systemic antibiotic 4/15; difficult area on the left medial ankle. Most likely chronic venous insufficiency. I think she will probably need venous reflux study I think she had DVT rule outs but not venous reflux studies. We have not yet obtained the topical antibiotics. She has home health changing the dressing we have been using Sorbact for adherent fibrinous debris on the surface. Very difficult to remove 4/22; patient presents for 1 week follow-up. She has been using sore back under compression wraps and these are changed 3 times a week with home health. She also  had Keystone antibiotics sent to her house and brought them in today. She has no complaints or issues today. 5/2; patient is here for follow-up. She has been using Sorbact under compression. Very painful wound. She has been using Keystone antibiotics. Not much improvement although the more medial part of the wound has cleaned up nicely and the larger part of the wound about 50% slough covered. Part of the issue here is that she had stays at both [facility] wound care center, [facility] wound care center and now us . Not sure if she has had venous reflux studies. As far as we are able to tell she did not have a biopsy. My notes state that she did not have venous reflux studies just DVT rule outs. 5/16; patient goes for venous reflux studies this afternoon. She says that wound was biopsied which sounds like punch biopsies by Dr. Donny we do not have these results. We are using Sorbact. Very difficult wound to debride 5/23; patient presents  for 1 week follow-up. She reports tolerating the wraps well with sorbact underneath. She had ABIs and venous reflux studies done. She has no issues or complaints today. She denies acute signs of infection. 6/6; I have reviewed the patient's vascular studies. Wounds are on the left medial and posterior calf. She had significant reflux in the greater saphenous vein in the in the mid thigh, distal thigh knee and the small saphenous vein in the popliteal fossa. The vein diameters do not look too impressive though. She is going to see the vascular surgeon on Wednesday. She also had venous reflux in the right common femoral vein. She did not have any evidence of a DVT or SVT I am wondering whether there is an ablation procedure that would benefit her in the greater saphenous vein on the left. . She tells us  that home health put the dressing on too tight and she took off 1 layer. The swelling in her left leg is a little worse as a result of this. Not much change in the wound  measurements so the surface of the wound looks better Her arterial studies showed an ABI on the right of 1.14 with a triphasic waveform and a great toe pressure of 0.89. On the left her ABIs were noncompressible at 1.34 but with triphasic waveforms and a TBI of 0.97. Her greater toe pressure was 122. There was no evidence of significant bilateral arterial disease 6/20 patient went to see Dr. Melvenia. He did not think she had significant arterial disease. In terms of her venous duplex on the right side there was no evidence of a DVT or SVT there was deep venous reflux involving the common femoral vein no superficial vein reflux on the left side there was no DVT or SVT there was no deep vein graft reflux there was some reflux in the greater saphenous vein from the mid thigh to the knee but the vein here was not dilated. He thought these were venous wounds he prescribed a compression pump but I am not sure who we ordered it from The patient has been approved for Apligraf. Still using silver collagen this week 7/5; Apligraf #1 7/19 Apligraf #2. Decent improvement in the condition of the wound bed. Epithelialization distal 8/2 Apligraf #3. No issues or complaints. Denies signs of infection. 8/17 Apligraf #4. No issues or concerns. Some complaints of pruritus and the rash. Our intake nurse brought up the fact that she had previously indicated possible cotton layer sensitivity we will therefore use kerlix in the bottom layer of the compression 8/30; the patient comes in with the area on her left medial leg just about healed. There is a superficial area more towards the tibia and a smaller open area distally everything else is epithelialized. I do not think she requires another Apligraf 9/13; left medial leg is healed. 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 Her edema control is good Readmisstion: SAMEERAH, NACHTIGAL ([id number])  [id number] Page 7 of 10 05-22-2022 upon evaluation today patient presents for initial inspection here in our clinic concerning issues that she has been having with a wound of the right lateral lower extremity. This is an area of a previous skin graft she tells me. With that being said she unfortunately had a scrape on this that occurred around 10 January. Since that time she has noted that this has just continued to get bigger and bigger in her niece who is present with her  today actually states that 2 weeks ago when she saw that it was significantly smaller than what she sees currently. Obviously this is of utmost concern as we do not want this to continue to get larger when arrested and get moving in the right direction. Fortunately there does not appear to be any signs of systemic infection though locally I think we probably do have some infection present. I would obtain a culture to see what we have going on here and then we will subsequently see where things go going forward. Fortunately I think that she is in the right place at this point being at the wound center we can definitely do something to try to get this moving in the right direction. Patient does have a history of chronic venous insufficiency as well as coronary artery disease. In the past she has done well with compression wraps on the start with a 3 layer wrap I think she is probably can end up going to a 4-layer at some point but we will see how things do over the next week. She has been using wound cleanser along with she tells me a zinc and topical but again I am not sure exactly what that was. 05-29-2022 upon evaluation today patient appears to be doing well currently in regard to her wound. This actually is showing signs of improvement I am happy in that regard unfortunately her left leg has an area on the shin that has opened. This is the region where one of the areas at least we have previously taken  care of. Nonetheless this is small hoping we get it under control before things worsen significantly. 06-05-2022 upon evaluation today patient appears to be doing well currently in regard to her wounds. The actually seem to be fairly clean she is having still quite a bit of problem with pain at this point she would like to not have debridement today for all possible. With that being said I do believe that we are making some good progress I think the Hydrofera Blue is doing a decent job here. 3/6; patient has had 3/6; patient with known severe chronic venous insufficiency. She apparently had a traumatic wound at home and has a reasonably substantial wound on the right lateral lower leg and a smaller one on the left anterior lower leg as well. We have been using Hydrofera Blue and Unna boots 3/13; patient presents for follow-up. We have been using Hydrofera Blue under Coflex to the legs bilaterally. She has no issues or complaints today. 06-26-2022 upon evaluation today patient appears to be doing well currently in regard to her wound. This is measuring a little bit larger however. Fortunately I do not see any signs of infection this is on the right side. Fortunately the left side is actually completely healed which is great news. 07-03-2022 patient's wound unfortunately is continuing to show signs of being worse. Her compression which was the Tubigrip that I thought should be able to pull up actually slipped down and she was not able to pull that back up. With that being said that means that unfortunately her wound has continued to deteriorate since I last saw her. This is definitely not the direction that we are looking for. I discussed with her that I do believe we need to go ahead and see about getting her started on antibiotics and I subsequently would also like to go ahead and see about getting things moving forward with regard to a wound culture and making  a change up her dressings as well but this  can be changed more frequently than just once a week. 07-10-2022 upon evaluation today patient actually appears to be doing much better. I do believe that the antibiotics have been beneficial for her. Fortunately I do not see any signs of active infection locally nor systemically which is great news. No fevers, chills, nausea, vomiting, or diarrhea. 07-17-2022 upon evaluation today patient appears to be doing well currently in regard to her wound which is actually showing signs of improvement this is slow but nonetheless prevalent that we are seeing good improvement here. Fortunately I do not see any signs of active infection locally nor systemically which is great news. 07-24-2022 upon evaluation today patient appears to be doing well currently in regard to her wound although the wrap that we had on has been slipping down and she really does not have anybody to help her with this at home health is not going to be coming out we could not get anybody that would actually be able to do the dressing changes. Fortunately I do not see any signs of active infection locally nor systemically which is great news. 08-07-22 poorly in regard to her leg compared to what it was previous. Fortunately there does not appear to be any signs of active infection locally nor systemically which is great news. No fevers, chills, nausea, vomiting, or diarrhea. With that being said it does appear that the patient may have some cellulitis in the leg which is not good. 08-14-2022 upon evaluation today patient appears to be doing somewhat better in regard to her wounds she still is having quite a bit of pain we are still not where we want to be as far as healing is concerned completely. Fortunately I do not see any evidence of active infection locally nor systemically which is great news and I am very pleased in that regard. 08-21-23 upon evaluation today patient appears to be doing poorly currently in regard to her wound. She has been  tolerating the dressing changes without complication. Fortunately there does not appear to be any signs of active infection locally nor systemically at this time. 08-28-2022 upon evaluation today patient appears to be doing poorly in regard to her wound this was not wrapped appropriately home health did not go up high enough on the wrap. This has caused some issues and I discussed that with the patient today. I do believe that she is going to require aggressive wrapping and treatment which she is getting the [facility] topical antibiotics today they can start using that at the next wrap on Friday. In the meantime they need to make sure that the rapid and appropriately we wrote very specific orders today. 09-04-2022 upon evaluation today patient appears to be doing well currently in regard to her wound which I think is making progress is still hurting her quite significantly. Fortunately I do not see any signs of active infection locally or systemically which is great news. No fevers, chills, nausea, vomiting, or diarrhea. 09-11-2022 upon evaluation today patient's wound actually is showing signs of being a little bit smaller and looking a little bit better she still has a lot going on here however. Fortunately I do not see any evidence of active infection Worsening locally nor systemically which is great news. With that being said worsening still continue to use the topical Keystone antibiotics. 09-18-2022 upon evaluation today patient actually showing some signs of improvement. I am actually very pleased with where we  stand compared to where we have been. I think that she is making good headway here. She is still having quite a bit of pain but it seems to be lessening compared to previous. 09-25-2022 upon evaluation patient's wound actually showing signs slowly but surely of improvement. Fortunately I do not see any signs of active infection at this time systemically and locally I feel like this is  dramatically improved. She is doing well with the [facility] topical antibiotics. 10-02-2022 upon evaluation today patient appears to be doing better in regard to her wound overall. I am very pleased with where things stand from a visual standpoint I do not see any signs of active infection and in general I think that we are moving in the right direction here. 10-09-2022 upon evaluation today patient appears to be doing well currently in regard to her wound. She has been tolerating the dressing changes without complication. Fortunately I do not see any signs of active infection at this time which is great news. 10-16-2022 upon evaluation today patient continues to have quite a bit of pain in regard to her right leg. We have been attempting to debride little by little as we could but again was pretty limited by the fact that she is having significant discomfort. We do not actually have any signs of active infection going on at this point that the [facility] topical antibiotics have been helping quite readily. I been attempting to do as much debridement as I can but if she were to have pain medication this would actually help and in the past when she did it was much more tolerable for her as far as taking the edge off. Right now she has been without as she is in transition from her previous pain management physician to someone new to manage this. 10-23-2022 upon evaluation patient appears to be doing excellent in regard to her wound. She has been tolerating the dressing changes without complication. Fortunately I do not see any evidence of active infection locally nor systemically which is great news. No fevers, chills, nausea, vomiting, or diarrhea. 7/24; patient with a wound secondary to chronic venous insufficiency on the right lateral lower leg. We have been using [facility] antibiotic Hydrofera Blue Faddis, Kaliegh W ([id number]) [id number] Page 8 of 10 under kerlix Coban. She  complains of a lot of pain after last week's debridement otherwise things appear to be improved per discussion with our intake nurse. 11-06-2022 upon evaluation today patient appears to be doing excellent at this point in regard to her wound. She has been tolerating the dressing changes without complication and to be honest she is making excellent progress towards complete closure. I am actually very pleased with where we stand I think that she is doing excellent as far as the overall appearance of the wound is concerned as well. She is going require some debridement however. 11-20-2022 upon evaluation today patient appears to be doing well currently in regard to her wound. She has been tolerating the dressing changes without complication. Fortunately there does not appear to be any signs of active infection locally or systemically which is great news and in general I do believe that we are moving in the right direction here. No fevers, chills, nausea, vomiting, or diarrhea. 11-27-2022 upon evaluation today patient appears to be doing a little better in regards to the overall appearance of her wound but unfortunately she is having increased pain. The collagen seems to be getting very dry and stuck to the wound bed  which is causing her some issues here. Fortunately I do not see any signs of active infection locally nor systemically at this time. Fortunately I think that her wound is better but unfortunately her pain has not. For that reason I am going to avoid any debridement today since the patient is very upset about the pain and how bad this is hurting at this point. I think we can have to try something a little bit different I am thinking PolyMem may be a good option. 8/28; this patient has difficult venous wounds on the right lateral lower leg and ankle in the setting of chronic venous insufficiency. We have been using polymen under compression with kerlix Coban. 12-11-2022 upon evaluation today patient  appears to be doing well currently in regard to her wound. She has been tolerating the dressing changes without complication and actually appears to be doing much better this week compared to when I saw her 2 weeks ago. The wound is measuring significantly smaller. We are using PolyMem along with Kerlix and Coban. 12-18-2022 upon evaluation today patient appears to be doing well currently in regard to her wound. This is actually showing signs of improvement is measuring a little bit smaller and looking better. Fortunately I do not see any evidence of worsening overall and I believe that we will making good headway with the PolyMem and the [facility] topical antibiotics. 12-25-2022 upon evaluation today patient appears to be doing well currently in regard to her wound. She has been tolerating the dressing changes without complication. Fortunately I do not see any evidence of infection locally or systemically which is great news and in general I do believe that we will make an excellent headway towards complete closure also excellent news. 01-01-2023 upon evaluation patient's wound actually showing signs of improvement the wrap was actually put on properly this week and this is good news. Overall I am extremely happy with where things stand and how this appears today I do not see any signs of worsening overall and I believe that the patient is making excellent headway towards complete closure which is great news. 01/08/2023 upon evaluation today patient appears to be doing well currently in regard to her leg. She is actually draining much less unfortunately home health is just not doing very well getting the supplies necessary in order to continue to take care of the patient. They are now telling me they cannot get the PolyMem which is something that has been doing really well for her. That really does not make sense to me you came to get PolyMem for a hospice patient. Nonetheless either way I think we may  need to just discontinue treatment with home health and just manage the patient here at the clinic. 01-15-2023 upon evaluation today patient appears to be doing well currently in regard to her wound. She has been tolerating the dressing changes without complication. Fortunately I do not see any signs of infection I think she is doing quite well and very pleased with where we stand today. No fevers, chills, nausea, vomiting, or diarrhea. 01-22-2023 upon evaluation today patient appears to be doing well currently in regard to her wound. She has been tolerating the dressing changes without complication and both wounds are measuring smaller today and look to be doing excellent. I am actually very pleased with what ever standing here and I think that she is making really good headway towards closure which is great news. 01-29-2023 upon evaluation today patient actually tells me she has  been having some increased pain. Subsequently it took a little bit of drilling down but in the end I realized that we actually ended up putting a full Unna boot on her last week as opposed to just using a good anchor at the top and bottom. I think this is what wound up with her having increased discomfort because she tells me as she was doing well when she came in last time and by the time she left she tells me this was already getting significantly worse. And she hurt most of the week. With that being said I do believe that the patient is doing quite well at this point with regard to her wounds and the wrap did okay it was not a problem other than the increased pain I think we may benefit from a Urgo K2 light compression wrap as this will be much more comfortable I think compared to what we have been doing with just the Kerlix Coban anyway. 02-05-2023 upon evaluation today patient appears to be doing well currently in regard to her wounds. She has been tolerating the dressing changes without complication. Fortunately I do not  see any signs of active infection looking systemically which is great news. No fevers, chills, nausea, vomiting, or diarrhea. 11/6; patient I remember from a previous stay in this clinic With a left lower extremity wound.. She has a right lower extremity venous wound. She has been using [facility], polymen under [employer]. Her wound now is to remaining open areas. There is no evidence of infection although she is tender in the area. I wonder whether there is stasis dermatitis here. She reminds me that she had some form of skin graft on this area during his stay at the [facility] wound care center presumably in around 2022. 11/13; difficult wound on the right lateral lower extremity. She has a history of venous wounds in this area and a history of a skin graft in this area. She can planes about a lot of pain but she has not been systemically unwell. No fever chills etc. 11/20; continue difficult wounds on the right lateral lower extremity. She has a history of venous wounds in this area and a history of skin graft in this area. She continues to complain of pain. She comes in today with completely nonviable surfaces. She has been using Keystone polymen under and Urgo K2 light 12/3; significant improvement in wound dimensions still 2 open wounds which were initially part of the larger single wound. We have been using Keystone polymen under Urgo K2 lite. The wounds are smaller today. The surface does not look as viable as I might like although she does not tolerate mechanical debridement at all 12/11; 2 open wounds 1 on the right anterior and 1 on the lateral lower leg. We have been using Keystone polymen under Urgo K2 lite's. The wounds are measuring smaller. 12/18; right anterior and right lateral lower leg.. 2 open areas. Somewhat smaller we have been using Keystone polymen under Urgo K2 lite's. The surrounding area is mostly scar tissue from previous skin grafts in this area. 1. We are going to continue  with the polymen Ag ABDs and Urgo K2's. He is doing nicely on both sides. 2. Again we talked about compression stockings when these have healed. I think he is going to require 30/40 mm of equivalent compression. Juxta lite stockings would be a good alternative but he seemed more interested in standard over the toe stockings 04/10/2023. The more anterior wound  has completely closed and this posterior wound is down half the size. We have been using polymen Ag under compression.Jamie [name] She will need stockings I am not sure which she has when this closes which may be as early as the next week or 2 Jamie, [name] ([id number]) [id number] Page 9 of 10 Objective Constitutional Sitting or standing Blood Pressure is within target range for patient.. Pulse regular and within target range for patient.Jamie [name] Respirations regular, non-labored and within target range.. Temperature is normal and within the target range for the patient.Jamie [name] Appears in no distress. Vitals Time Taken: 10:47 AM, Height: 62 in, Weight: 171 lbs, BMI: 31.3, Temperature: 98.4 F, Pulse: 48 bpm, Respiratory Rate: 18 breaths/min, Blood Pressure: 154/85 mmHg. General Notes: Wound exam; the lateral wound is healed. Still 1 open area posteriorly. This looks really quite good. No debridement is required. She still complains of discomfort in this area but there is no palpable tenderness or observable a erythema. Integumentary (Hair, Skin) Wound #5 status is Open. Original cause of wound was Trauma. The date acquired was: 04/17/2022. The wound has been in treatment 46 weeks. The wound is located on the Right,Lateral Lower Leg. The wound measures 0cm length x 0cm width x 0cm depth; 0cm^2 area and 0cm^3 volume. There is no tunneling or undermining noted. There is a none present amount of drainage noted. The wound margin is thickened. There is no granulation within the wound bed. There is no necrotic tissue within the wound bed. The  periwound skin appearance exhibited: Scarring, Hemosiderin Staining. The periwound skin appearance did not exhibit: Callus, Crepitus, Excoriation, Induration, Rash, Dry/Scaly, Maceration, Atrophie Blanche, Cyanosis, Ecchymosis, Mottled, Pallor, Rubor, Erythema. Periwound temperature was noted as No Abnormality. The periwound has tenderness on palpation. Wound #7 status is Open. Original cause of wound was Gradually Appeared. The date acquired was: 01/01/2023. The wound has been in treatment 14 weeks. The wound is located on the Right,Posterior Lower Leg. The wound measures 1.2cm length x 1.2cm width x 0.1cm depth; 1.131cm^2 area and 0.113cm^3 volume. There is Fat Layer (Subcutaneous Tissue) exposed. There is no tunneling or undermining noted. There is a medium amount of serosanguineous drainage noted. The wound margin is thickened. There is large (67-100%) red, pink granulation within the wound bed. There is a small (1-33%) amount of necrotic tissue within the wound bed including Adherent Slough. The periwound skin appearance exhibited: Dry/Scaly. The periwound skin appearance did not exhibit: Callus, Crepitus, Excoriation, Induration, Rash, Scarring, Maceration, Atrophie Blanche, Cyanosis, Ecchymosis, Hemosiderin Staining, Mottled, Pallor, Rubor, Erythema. Periwound temperature was noted as No Abnormality. The periwound has tenderness on palpation. 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 - 04/16/2023 1115 AM (already scheduled) Return Appointment in 2 weeks. - 04/23/2023 111 (already scheduled) Other: - you will need to wear compression stockings for right leg once it heals. Bathing/ Shower/  Hygiene: May shower and wash wound with soap and water. - with dressing changes. Edema Control - Orders / Instructions: Elevate legs to the level of the heart or above for 30 minutes daily and/or when sitting for 3-4 times a day throughout the day. Avoid standing for long periods of time. Exercise regularly - As tolerated WOUND #7: - Lower Leg Wound Laterality: Right, Posterior Cleanser: Soap and Water 1  x Per Week/30 Days Discharge Instructions: May shower and wash wound with dial antibacterial soap and water prior to dressing change. Peri-Wound Care: Sween Lotion (Moisturizing lotion) 1 x Per Week/30 Days Discharge Instructions: Apply moisturizing lotion as directed Prim Dressing: PolyMem Silver Non-Adhesive Dressing, 4.25x4.25 in 1 x Per Week/30 Days ary Discharge Instructions: Apply to wound bed as instructed Secondary Dressing: ABD Pad, 8x10 1 x Per Week/30 Days Discharge Instructions: Apply over primary dressing as directed. Secondary Dressing: Woven Gauze Sponge, Non-Sterile 4x4 in 1 x Per Week/30 Days Discharge Instructions: Apply over primary dressing as directed. Com pression Wrap: Kerlix Roll 4.5x3.1 (in/yd) 1 x Per Week/30 Days Discharge Instructions: Apply Kerlix and Coban compression as directed. Com pression Wrap: Coban Self-Adherent Wrap 4x5 (in/yd) 1 x Per Week/30 Days Discharge Instructions: Apply over Kerlix as directed. Com pression Wrap: unna boot FIRST LAYER **** SEE INSTRUCTIONS 1 x Per Week/30 Days Discharge Instructions: APPLY FIRST LAYER UNNA BOOT AT BASES OF TOES AND JUST BELOW THE KNEE TO HOLD COMPRESSION WRAP IN PLACE. 1. Continue with polymen Ag ABD under kerlix Coban. 2 edema control seems quite good. 3 no evidence of surrounding infection 4. In spite of her continued complaints of pain in this area I have been unable to really determine an obvious cause certainly none related to the wound. She admits today that the pain is improved Jamie, [name]  ([id number]) [id number] Page 10 of 10 Electronic Signature(s) Signed: 04/10/2023 4:03:36 PM By: Jamie Sharper MD Entered By: Jamie Sharper on 04/10/2023 11:18:00 -------------------------------------------------------------------------------- SuperBill Details Patient Name: Date of Service: Jamie [name], Jamie CREASIE [name]. 04/10/2023 Medical Record Number: [id number] Patient Account Number: [id number] Date of Birth/Sex: Treating RN: 1941/03/07 (83 y.o. Jamie Pontes, Armida Primary Care Provider: Devra [name] Other Clinician: Referring Provider: Treating Provider/Extender: Jamie Sharper Jamie [name] Jamie in Treatment: 46 Diagnosis Coding ICD-10 Codes Code Description [phone] Chronic venous hypertension (idiopathic) with ulcer and inflammation of right lower extremity 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d I25.10 Atherosclerotic heart disease of native coronary artery without angina pectoris Facility Procedures : CPT4 Code: [id number] Description: 99214 - WOUND CARE VISIT-LEV 4 EST PT Modifier: Quantity: 1 Physician Procedures : CPT4 Code Description Modifier [id number] - WC PHYS LEVEL 3 - EST PT ICD-10 Diagnosis Description 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d I87.331 Chronic venous hypertension (idiopathic) with ulcer  and inflammation of right lower extremity Quantity: 1 Electronic Signature(s) Signed: 04/10/2023 4:03:36 PM By: Jamie Sharper MD Entered By: Jamie Sharper on 04/10/2023 11:18:17

## 2023-04-16 ENCOUNTER — Ambulatory Visit (HOSPITAL_BASED_OUTPATIENT_CLINIC_OR_DEPARTMENT_OTHER): Payer: Medicare HMO | Admitting: Internal Medicine

## 2023-04-23 ENCOUNTER — Encounter (HOSPITAL_BASED_OUTPATIENT_CLINIC_OR_DEPARTMENT_OTHER): Payer: Medicare HMO | Admitting: Internal Medicine

## 2023-04-23 DIAGNOSIS — L97812 Non-pressure chronic ulcer of other part of right lower leg with fat layer exposed: Secondary | ICD-10-CM

## 2023-04-23 DIAGNOSIS — I87331 Chronic venous hypertension (idiopathic) with ulcer and inflammation of right lower extremity: Secondary | ICD-10-CM | POA: Diagnosis not present

## 2023-04-24 NOTE — Progress Notes (Signed)
Jamie [name] ([id number]) [id number] Page 1 of 9 Visit Report for 04/23/2023 Arrival Information Details Patient Name: Date of Service: Jamie [name], [name]. 04/23/2023 11:15 A M Medical Record Number: [id number] Patient Account Number: [id number] Date of Birth/Sex: Treating RN: 12-11-1940 (83 y.o. Jamie [name] Primary Care Ernie Kasler: Belva Agee Other Clinician: Referring Jamie [name]: Treating Jamie [name]/Extender: Ellis Savage in Treatment: 48 Visit Information History Since Last Visit Added or deleted any medications: No Patient Arrived: Ambulatory Any new allergies or adverse reactions: No Arrival Time: 11:38 Had a fall or experienced change in No Accompanied By: daughter activities of daily living that may affect Transfer Assistance: None risk of falls: Patient Identification Verified: Yes Signs or symptoms of abuse/neglect since last visito No Secondary Verification Process Completed: Yes Has Dressing in Place as Prescribed: Yes Patient Requires Transmission-Based Precautions: No Has Compression in Place as Prescribed: Yes Patient Has Alerts: No Pain Present Now: No Electronic Signature(s) Signed: 04/23/2023 11:59:58 AM By: Midge Aver MSN RN CNS WTA Entered By: Midge Aver on 04/23/2023 11:59:58 -------------------------------------------------------------------------------- Clinic Level of Care Assessment Details Patient Name: Date of Service: Jamie [name], [place] W. 04/23/2023 11:15 A M Medical Record Number: [id number] Patient Account Number: [id number] Date of Birth/Sex: Treating RN: 1940-11-05 (83 y.o. Jamie [name] Primary Care Jamie [name]: Belva Agee Other Clinician: Referring Jamie [name]: Treating Jamie [name]/Extender: Ellis Savage in Treatment: 48 Clinic Level of Care Assessment Items TOOL 1 Quantity Score []  - 0 Use when EandM and Procedure is performed on INITIAL visit ASSESSMENTS -  Nursing Assessment / Reassessment []  - 0 General Physical Exam (combine w/ comprehensive assessment (listed just below) when performed on new pt. evals) []  - 0 Comprehensive Assessment (HX, ROS, Risk Assessments, Wounds Hx, etc.) ASSESSMENTS - Wound and Skin Assessment / Reassessment []  - 0 Dermatologic / Skin Assessment (not related to wound area) ASSESSMENTS - Ostomy and/or Continence Assessment and Care []  - 0 Incontinence Assessment and Management []  - 0 Ostomy Care Assessment and Management (repouching, etc.) PROCESS - Coordination of Care []  - 0 Simple Patient / Family Education for ongoing care []  - 0 Complex (extensive) Patient / Family Education for ongoing care []  - 0 Staff obtains Consents, Records, T Results / Process Orders est Jamie [name] ([id number]) [id number] Page 2 of 9 []  - 0 Staff telephones HHA, Nursing Homes / Clarify orders / etc []  - 0 Routine Transfer to another Facility (non-emergent condition) []  - 0 Routine Hospital Admission (non-emergent condition) []  - 0 New Admissions / Manufacturing engineer / Ordering NPWT Apligraf, etc. , []  - 0 Emergency Hospital Admission (emergent condition) PROCESS - Special Needs []  - 0 Pediatric / Minor Patient Management []  - 0 Isolation Patient Management []  - 0 Hearing / Language / Visual special needs []  - 0 Assessment of Community assistance (transportation, D/C planning, etc.) []  - 0 Additional assistance / Altered mentation []  - 0 Support Surface(s) Assessment (bed, cushion, seat, etc.) INTERVENTIONS - Miscellaneous []  - 0 External ear exam []  - 0 Patient Transfer (multiple staff / Nurse, adult / Similar devices) []  - 0 Simple Staple / Suture removal (25 or less) []  - 0 Complex Staple / Suture removal (26 or more) []  - 0 Hypo/Hyperglycemic Management (do not check if billed separately) []  - 0 Ankle / Brachial Index (ABI) - do not check if billed separately Has  the patient been seen at the hospital within the last three years: Yes Total Score: 0 Level Of Care: ____ Electronic  Signature(s) Signed: 04/23/2023 4:44:23 PM By: Midge Aver MSN RN CNS WTA Entered By: Midge Aver on 04/23/2023 12:30:02 -------------------------------------------------------------------------------- Compression Therapy Details Patient Name: Date of Service: Jamie [name], [name] 04/23/2023 11:15 A M Medical Record Number: [id number] Patient Account Number: [id number] Date of Birth/Sex: Treating RN: June 15, 1940 (83 y.o. Jamie [name] Primary Care Jamie [name]: Belva Agee Other Clinician: Referring Jamie [name]: Treating Perle Gibbon/Extender: Ellis Savage in Treatment: 48 Compression Therapy Performed for Wound Assessment: Wound #7 Right,Posterior Lower Leg Performed By: Clinician Midge Aver, RN Compression Type: Double Layer Post Procedure Diagnosis Same as Pre-procedure Electronic Signature(s) Signed: 04/23/2023 4:44:23 PM By: Midge Aver MSN RN CNS WTA Entered By: Midge Aver on 04/23/2023 12:18:30 Jamie [name] ([id number]) [id number] Page 3 of 9 -------------------------------------------------------------------------------- Encounter Discharge Information Details Patient Name: Date of Service: Jamie [name], [name]. 04/23/2023 11:15 A M Medical Record Number: [id number] Patient Account Number: [id number] Date of Birth/Sex: Treating RN: May 22, 1940 (83 y.o. Jamie [name] Primary Care Oveta Idris: Belva Agee Other Clinician: Referring Jamie [name]: Treating Jamie [name]/Extender: Ellis Savage in Treatment: 48 Encounter Discharge Information Items Post Procedure Vitals Discharge Condition: Stable Temperature (F): 98.3 Ambulatory Status: Ambulatory Pulse (bpm): 66 Discharge Destination: Home Respiratory Rate (breaths/min): 18 Transportation: Private Auto Blood Pressure (mmHg):  135/88 Accompanied By: neice Schedule Follow-up Appointment: Yes Clinical Summary of Care: Electronic Signature(s) Signed: 04/23/2023 4:44:23 PM By: Midge Aver MSN RN CNS WTA Entered By: Midge Aver on 04/23/2023 12:32:59 -------------------------------------------------------------------------------- Lower Extremity Assessment Details Patient Name: Date of Service: Jamie [name], [name] 04/23/2023 11:15 A M Medical Record Number: [id number] Patient Account Number: [id number] Date of Birth/Sex: Treating RN: 1941-03-24 (83 y.o. Jamie [name] Primary Care Terek Bee: Belva Agee Other Clinician: Referring Dwon Sky: Treating Keaira Whitehurst/Extender: Ellis Savage in Treatment: 48 Edema Assessment Assessed: [name]: No] Franne Forts: No] Edema: [Left: N] [Right: o] Calf Left: Right: Point of Measurement: From Medial Instep 37 cm Ankle Left: Right: Point of Measurement: From Medial Instep 24 cm Vascular Assessment Extremity colors, hair growth, and conditions: Extremity Color: [Right:Normal] Hair Growth on Extremity: [Right:No] Temperature of Extremity: [Right:Warm] Capillary Refill: [Right:< 3 seconds] Dependent Rubor: [Right:No No] Electronic Signature(s) Signed: 04/23/2023 4:44:23 PM By: Midge Aver MSN RN CNS WTA Entered By: Midge Aver on 04/23/2023 11:40:20 Jamie [name] ([id number]) [id number] Page 4 of 9 -------------------------------------------------------------------------------- Multi Wound Chart Details Patient Name: Date of Service: Jamie [name], [name]. 04/23/2023 11:15 A M Medical Record Number: [id number] Patient Account Number: [id number] Date of Birth/Sex: Treating RN: 23-Jul-1940 (83 y.o. F) Primary Care Fredy Gladu: Belva Agee Other Clinician: Referring Hulon Ferron: Treating Nariyah Osias/Extender: Ellis Savage in Treatment: 48 Vital Signs Height(in): 62 Pulse(bpm): 66 Weight(lbs): 171 Blood  Pressure(mmHg): 135/88 Body Mass Index(BMI): 31.3 Temperature(F): 98.3 Respiratory Rate(breaths/min): 18 [7:Photos:] [N/A:N/A] Right, Posterior Lower Leg N/A N/A Wound Location: Gradually Appeared N/A N/A Wounding Event: Venous Leg Ulcer N/A N/A Primary Etiology: Sleep Apnea, Hypertension, Peripheral N/A N/A Comorbid History: Venous Disease, Osteoarthritis, Neuropathy 01/01/2023 N/A N/A Date Acquired: 16 N/A N/A Weeks of Treatment: Open N/A N/A Wound Status: No N/A N/A Wound Recurrence: 0.7x0.7x0.1 N/A N/A Measurements L x W x D (cm) 0.385 N/A N/A A (cm) : rea 0.038 N/A N/A Volume (cm) : 96.70% N/A N/A % Reduction in A rea: 98.40% N/A N/A % Reduction in Volume: Full Thickness Without Exposed N/A N/A Classification: Support Structures Medium N/A N/A Exudate A mount: Serosanguineous N/A N/A Exudate Type: red, brown N/A N/A Exudate Color: Thickened N/A  N/A Wound Margin: Large (67-100%) N/A N/A Granulation A mount: Red, Pink N/A N/A Granulation Quality: Small (1-33%) N/A N/A Necrotic A mount: Fat Layer (Subcutaneous Tissue): Yes N/A N/A Exposed Structures: Fascia: No Tendon: No Muscle: No Joint: No Bone: No Large (67-100%) N/A N/A Epithelialization: Debridement - Selective/Open Wound N/A N/A Debridement: Pre-procedure Verification/Time Out 12:16 N/A N/A Taken: Lidocaine 4% Topical Solution N/A N/A Pain Control: Slough N/A N/A Tissue Debrided: Non-Viable Tissue N/A N/A Level: 0.38 N/A N/A Debridement A (sq cm): rea Curette N/A N/A Instrument: Minimum N/A N/A Bleeding: Pressure N/A N/A Hemostasis A chieved: 0 N/A N/A Procedural Pain: 0 N/A N/A Post Procedural Pain: Procedure was tolerated well N/A N/A Debridement Treatment Response: 0.7x0.7x0.1 N/A N/A Post Debridement Measurements L x W x D (cm) Jamie, [name] ([id number]) [id number] Page 5 of 9 0.038 N/A N/A Post Debridement Volume: (cm) Excoriation:  No N/A N/A Periwound Skin Texture: Induration: No Callus: No Crepitus: No Rash: No Scarring: No Dry/Scaly: Yes N/A N/A Periwound Skin Moisture: Maceration: No Atrophie Blanche: No N/A N/A Periwound Skin Color: Cyanosis: No Ecchymosis: No Erythema: No Hemosiderin Staining: No Mottled: No Pallor: No Rubor: No No Abnormality N/A N/A Temperature: Yes N/A N/A Tenderness on Palpation: Compression Therapy N/A N/A Procedures Performed: Debridement Treatment Notes Wound #7 (Lower Leg) Wound Laterality: Right, Posterior Cleanser Soap and Water Discharge Instruction: May shower and wash wound with dial antibacterial soap and water prior to dressing change. Peri-Wound Care Sween Lotion (Moisturizing lotion) Discharge Instruction: Apply moisturizing lotion as directed Topical Gentamicin Discharge Instruction: As directed by physician Primary Dressing PolyMem Silver Non-Adhesive Dressing, 4.25x4.25 in Discharge Instruction: Apply to wound bed as instructed Secondary Dressing ABD Pad, 8x10 Discharge Instruction: Apply over primary dressing as directed. Woven Gauze Sponge, Non-Sterile 4x4 in Discharge Instruction: Apply over primary dressing as directed. Secured With Compression Wrap Kerlix Roll 4.5x3.1 (in/yd) Discharge Instruction: Apply Kerlix and Coban compression as directed. Coban Self-Adherent Wrap 4x5 (in/yd) Discharge Instruction: Apply over Kerlix as directed. unna boot FIRST LAYER **** SEE INSTRUCTIONS Discharge Instruction: APPLY FIRST LAYER UNNA BOOT AT BASES OF TOES AND JUST BELOW THE KNEE TO HOLD COMPRESSION WRAP IN PLACE. Compression Stockings Add-Ons Electronic Signature(s) Signed: 04/23/2023 4:27:59 PM By: Geralyn Corwin DO Entered By: Geralyn Corwin on 04/23/2023 12:57:01 Jamie [name] ([id number]) [id number] Page 6 of  9 -------------------------------------------------------------------------------- Multi-Disciplinary Care Plan Details Patient Name: Date of Service: Jamie [name], [name]. 04/23/2023 11:15 A M Medical Record Number: [id number] Patient Account Number: [id number] Date of Birth/Sex: Treating RN: 11/25/1940 (83 y.o. Jamie [name] Primary Care Laurian Edrington: Belva Agee Other Clinician: Referring Aaro Meyers: Treating Dedria Endres/Extender: Ellis Savage in Treatment: 48 Multidisciplinary Care Plan reviewed with physician Active Inactive Pain, Acute or Chronic Nursing Diagnoses: Pain Management - Cyclic Acute (Dressing Change Related) Pain Management - Non-cyclic Acute (Procedural) Pain, acute or chronic: actual or potential Goals: Patient will verbalize adequate pain control and receive pain control interventions during procedures as needed Date Initiated: 09/11/2022 Target Resolution Date: 05/09/2023 Goal Status: Active Patient/caregiver will verbalize adequate pain control between visits Date Initiated: 09/11/2022 Target Resolution Date: 05/09/2023 Goal Status: Active Patient/caregiver will verbalize comfort level met Date Initiated: 09/11/2022 Target Resolution Date: 05/09/2023 Goal Status: Active Interventions: Complete pain assessment as per visit requirements Encourage patient to take pain medications as prescribed Provide education on pain management Provision of support: recognize patient pain, provide comfort and support as needed Reposition patient for comfort Treatment Activities: Administer pain control measures as ordered : 09/11/2022 Notes:  Electronic Signature(s) Signed: 04/23/2023 4:44:23 PM By: Midge Aver MSN RN CNS WTA Entered By: Midge Aver on 04/23/2023 12:29:24 -------------------------------------------------------------------------------- Pain Assessment Details Patient Name: Date of Service: Jamie [name], [name] 04/23/2023 11:15 A M Medical  Record Number: [id number] Patient Account Number: [id number] Date of Birth/Sex: Treating RN: [birth year] (83 y.o. Jamie [name] Primary Care Myisha Pickerel: Belva Agee Other Clinician: Referring Dorothee Napierkowski: Treating Anayiah Howden/Extender: Ellis Savage in Treatment: 48 Active Problems Location of Pain Severity and Description of Pain Patient Has Paino Yes Site Locations Rate the pain. Jamie, [name] ([id number]) [id number] Page 7 of 9 Rate the pain. Current Pain Level: 2 Pain Management and Medication Current Pain Management: Electronic Signature(s) Signed: 04/23/2023 4:44:23 PM By: Midge Aver MSN RN CNS WTA Entered By: Midge Aver on 04/23/2023 11:40:10 -------------------------------------------------------------------------------- Patient/Caregiver Education Details Patient Name: Date of Service: Jamie [name] 1/15/2025andnbsp11:15 A M Medical Record Number: [id number] Patient Account Number: [id number] Date of Birth/Gender: Treating RN: 04-11-1940 (83 y.o. Jamie [name] Primary Care Physician: Belva Agee Other Clinician: Referring Physician: Treating Physician/Extender: Ellis Savage in Treatment: 48 Education Assessment Education Provided To: Patient Education Topics Provided Wound/Skin Impairment: Handouts: Caring for Your Ulcer Methods: Explain/Verbal Responses: State content correctly Electronic Signature(s) Signed: 04/23/2023 4:44:23 PM By: Midge Aver MSN RN CNS WTA Entered By: Midge Aver on 04/23/2023 12:29:45 -------------------------------------------------------------------------------- Wound Assessment Details Patient Name: Date of Service: Jamie [name], [name] 04/23/2023 11:15 A M Medical Record Number: [id number] Patient Account Number: [id number] Date of Birth/Sex: Treating RN: [birth year] (83 y.o. Jamie [name] Primary Care Sly Parlee: Belva Agee Other  Clinician: MARSHAI, OKUNO ([id number]) [id number] Page 8 of 9 Referring Dejanique Ruehl: Treating Vinita Prentiss/Extender: Ellis Savage in Treatment: 48 Wound Status Wound Number: 7 Primary Venous Leg Ulcer Etiology: Wound Location: Right, Posterior Lower Leg Wound Open Wounding Event: Gradually Appeared Status: Date Acquired: 01/01/2023 Comorbid Sleep Apnea, Hypertension, Peripheral Venous Disease, Weeks Of Treatment: 16 History: Osteoarthritis, Neuropathy Clustered Wound: No Photos Wound Measurements Length: (cm) 0.7 Width: (cm) 0.7 Depth: (cm) 0.1 Area: (cm) 0.385 Volume: (cm) 0.038 % Reduction in Area: 96.7% % Reduction in Volume: 98.4% Epithelialization: Large (67-100%) Wound Description Classification: Full Thickness Without Exposed Support Structures Wound Margin: Thickened Exudate Amount: Medium Exudate Type: Serosanguineous Exudate Color: red, brown Foul Odor After Cleansing: No Slough/Fibrino Yes Wound Bed Granulation Amount: Large (67-100%) Exposed Structure Granulation Quality: Red, Pink Fascia Exposed: No Necrotic Amount: Small (1-33%) Fat Layer (Subcutaneous Tissue) Exposed: Yes Necrotic Quality: Adherent Slough Tendon Exposed: No Muscle Exposed: No Joint Exposed: No Bone Exposed: No Periwound Skin Texture Texture Color No Abnormalities Noted: No No Abnormalities Noted: No Callus: No Atrophie Blanche: No Crepitus: No Cyanosis: No Excoriation: No Ecchymosis: No Induration: No Erythema: No Rash: No Hemosiderin Staining: No Scarring: No Mottled: No Pallor: No Moisture Rubor: No No Abnormalities Noted: No Dry / Scaly: Yes Temperature / Pain Maceration: No Temperature: No Abnormality Tenderness on Palpation: Yes Treatment Notes Wound #7 (Lower Leg) Wound Laterality: Right, Posterior Cleanser Soap and Water Discharge Instruction: May shower and wash wound with dial antibacterial soap and water  prior to dressing change. Peri-Wound Care Sween Lotion (Moisturizing lotion) BRISELDA, LIPPINCOTT ([id number]) [id number] Page 9 of 9 Discharge Instruction: Apply moisturizing lotion as directed Topical Gentamicin Discharge Instruction: As directed by physician Primary Dressing PolyMem Silver Non-Adhesive Dressing, 4.25x4.25 in Discharge Instruction: Apply to wound bed as instructed Secondary Dressing ABD Pad, 8x10 Discharge Instruction: Apply over primary dressing as  directed. Woven Gauze Sponge, Non-Sterile 4x4 in Discharge Instruction: Apply over primary dressing as directed. Secured With Compression Wrap Kerlix Roll 4.5x3.1 (in/yd) Discharge Instruction: Apply Kerlix and Coban compression as directed. Coban Self-Adherent Wrap 4x5 (in/yd) Discharge Instruction: Apply over Kerlix as directed. unna boot FIRST LAYER **** SEE INSTRUCTIONS Discharge Instruction: APPLY FIRST LAYER UNNA BOOT AT BASES OF TOES AND JUST BELOW THE KNEE TO HOLD COMPRESSION WRAP IN PLACE. Compression Stockings Add-Ons Electronic Signature(s) Signed: 04/23/2023 4:44:23 PM By: Midge Aver MSN RN CNS WTA Entered By: Midge Aver on 04/23/2023 11:52:19 -------------------------------------------------------------------------------- Vitals Details Patient Name: Date of Service: Jamie Brink, [name] 04/23/2023 11:15 A M Medical Record Number: [id number] Patient Account Number: [id number] Date of Birth/Sex: Treating RN: [birth year] (83 y.o. Jamie [name] Primary Care Tarica Harl: Belva Agee Other Clinician: Referring Oneka Parada: Treating Christyann Manolis/Extender: Ellis Savage in Treatment: 48 Vital Signs Time Taken: 11:39 Temperature (F): 98.3 Height (in): 62 Pulse (bpm): 66 Weight (lbs): 171 Respiratory Rate (breaths/min): 18 Body Mass Index (BMI): 31.3 Blood Pressure (mmHg): 135/88 Reference Range: 80 - 120 mg / dl Electronic Signature(s) Signed: 04/23/2023  4:44:23 PM By: Midge Aver MSN RN CNS WTA Entered By: Midge Aver on 04/23/2023 11:39:39

## 2023-04-24 NOTE — Progress Notes (Signed)
Jamie [name] ([id number]) [id number] Page 1 of 12 Visit Report for 04/23/2023 Chief Complaint Document Details Patient Name: Date of Service: Jamie [name], [name]. 04/23/2023 11:15 A M Medical Record Number: [id number] Patient Account Number: [id number] Date of Birth/Sex: Treating RN: 1940-09-20 (83 y.o. F) Primary Care Provider: Belva [name] Other Clinician: Referring Provider: Treating Provider/Extender: Jamie [name] in Treatment: 48 Information Obtained from: Patient Chief Complaint Right LE Ulcer Electronic Signature(s) Signed: 04/23/2023 4:27:59 PM By: Jamie Corwin DO Entered By: Jamie [name] on 04/23/2023 12:57:07 -------------------------------------------------------------------------------- Debridement Details Patient Name: Date of Service: Jamie [name], Jamie [name] 04/23/2023 11:15 A M Medical Record Number: [id number] Patient Account Number: [id number] Date of Birth/Sex: Treating RN: 1940-04-25 (83 y.o. Jamie [name] Primary Care Provider: Belva [name] Other Clinician: Referring Provider: Treating Provider/Extender: Jamie [name] in Treatment: 48 Debridement Performed for Assessment: Wound #7 Right,Posterior Lower Leg Performed By: Physician Jamie Corwin, DO The following information was scribed by: Jamie [name] The information was scribed for: Jamie [name] Debridement Type: Debridement Severity of Tissue Pre Debridement: Fat layer exposed Level of Consciousness (Pre-procedure): Awake and Alert Pre-procedure Verification/Time Out Yes - 12:16 Taken: Start Time: 12:16 Pain Control: Lidocaine 4% T opical Solution Percent of Wound Bed Debrided: 100% T Area Debrided (cm): otal 0.38 Tissue and other material debrided: Non-Viable, Slough, Slough Level: Non-Viable Tissue Debridement Description: Selective/Open Wound Instrument: Curette Bleeding: Minimum Hemostasis Achieved:  Pressure Procedural Pain: 0 Post Procedural Pain: 0 Response to Treatment: Procedure was tolerated well Level of Consciousness (Post- Awake and Alert procedure): Post Debridement Measurements of Total Wound Length: (cm) 0.7 Width: (cm) 0.7 Depth: (cm) 0.1 Volume: (cm) 0.038 Jamie [name] ([id number]) [id number] Page 2 of 12 Character of Wound/Ulcer Post Debridement: Stable Severity of Tissue Post Debridement: Fat layer exposed Post Procedure Diagnosis Same as Pre-procedure Electronic Signature(s) Signed: 04/23/2023 4:27:59 PM By: Jamie Corwin DO Signed: 04/23/2023 4:44:23 PM By: Jamie Aver MSN RN CNS WTA Entered By: Jamie [name] on 04/23/2023 12:17:16 -------------------------------------------------------------------------------- HPI Details Patient Name: Date of Service: Jamie Brink, [place] Jamie W. 04/23/2023 11:15 A M Medical Record Number: [id number] Patient Account Number: [id number] Date of Birth/Sex: Treating RN: Sep 02, 1940 (83 y.o. F) Primary Care Provider: Belva [name] Other Clinician: Referring Provider: Treating Provider/Extender: Jamie [name] in Treatment: 48 History of Present Illness HPI Description: ADMISSION 06/30/2020 Jamie [name] is an 83 year old woman who lives in [place]. She is here with her niece for review of wounds on the left medial lower leg and ankle. These have apparently been present for over a year and she followed with Dr. Olegario [name] at the [facility] in [place] for quite a period of time although it looks as though there was a initial consult wound from Dr. Marcha [name] on February 18 presumably there was therefore hiatus. At that point the wounds were described as being there for 3 months. She also tells me she was at the wound care center in [place] for a period of time with this. 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 She had venous  studies that were negative for DVT ABIs on the right were 1.01 on the left 1.06. She has . had previous applications of puraply, compression which she does not tolerate very well. She has had several rounds of oral antibiotic therapy. She complains of unrelenting pain and she is seeing Dr. Reece Agar [name] of pain management apparently was on oxycodone but that did not help.  She also had a skin biopsy done by Dr. Olegario [name] although we do not have that result. She does not appear to have an arterial issue. I am not completely clear what she has been putting on the wounds lately. 3/31; this patient has a particularly nasty set of wounds on the left medial ankle in the middle of what looks to be hemosiderin deposition secondary to chronic venous insufficiency. She has a lot of pain followed by pain management. Dr. Marcha [name] apparently did a biopsy of something on the left leg last fall what I would like to get this result. She has a history of MRSA treatment. I do not believe she had reflux studies but she did have DVT rule out studies. Previous ABIs have not suggested arterial insufficiency 4/7; difficult wounds on the left medial ankle probably chronic venous insufficiency. With considerable effort on behalf of our case manager we were able to finally to speak to somebody at the hospital in [place] who indicated that no biopsy of this area have been done even though the patient describes this in some detail and is even able to point out where she thinks the biopsy was done. We have been using Sorbact. The PCR culture I did showed polymicrobial identification with Pseudomonas, staph aureus, Peptostreptococcus. All of this and low titers. Resistance genes identified were MRSA, staph virulence gene and tetracycline. We are going to send this to Jamie [name] for a topical antibiotic which is something that we have had good success with recently in large venous ulcers with a lot of purulent drainage. There would not be an easy  oral alternative here possibly line escalated and ciprofloxacin if we need to use systemic antibiotic 4/15; difficult area on the left medial ankle. Most likely chronic venous insufficiency. I think she will probably need venous reflux study I think she had DVT rule outs but not venous reflux studies. We have not yet obtained the topical antibiotics. She has home health changing the dressing we have been using Sorbact for adherent fibrinous debris on the surface. Very difficult to remove 4/22; patient presents for 1 week follow-up. She has been using sore back under compression wraps and these are changed 3 times a week with home health. She also had Keystone antibiotics sent to her house and brought them in today. She has no complaints or issues today. 5/2; patient is here for follow-up. She has been using Sorbact under compression. Very painful wound. She has been using Keystone antibiotics. Not much improvement although the more medial part of the wound has cleaned up nicely and the larger part of the wound about 50% slough covered. Part of the issue here is that she had stays at both [facility] wound care center, [name] wound care center and now [place]. Not sure if she has had venous reflux studies. As far as we are able to tell she did not have a biopsy. My notes state that she did not have venous reflux studies just DVT rule outs. 5/16; patient goes for venous reflux studies this afternoon. She says that wound was biopsied which sounds like punch biopsies by Dr. Lynden Ang we do not have these results. We are using Sorbact. Very difficult wound to debride 5/23; patient presents for 1 week follow-up. She reports tolerating the wraps well with sorbact underneath. She had ABIs and venous reflux studies done. She has no issues or complaints today. She denies acute signs of infection. 6/6; I have reviewed the patient's vascular studies. Wounds are on the left  medial and posterior calf. She had significant  reflux in the greater saphenous vein in the in the mid thigh, distal thigh knee and the small saphenous vein in the popliteal fossa. The vein diameters do not look too impressive though. She is going to see the vascular surgeon on Wednesday. She also had venous reflux in the right common femoral vein. She did not have any evidence of a DVT or SVT I am wondering whether there is an ablation procedure that would benefit her in the greater saphenous vein on the left. . She tells [place] that home health put the dressing on too tight and she took off 1 layer. The swelling in her left leg is a little worse as a result of this. Not much change in the wound measurements so the surface of the wound looks better Her arterial studies showed an ABI on the right of 1.14 with a triphasic waveform and a great toe pressure of 0.89. On the left her ABIs were noncompressible at 1.34 but with triphasic waveforms and a TBI of 0.97. Her greater toe pressure was 122. There was no evidence of significant bilateral arterial disease Jamie [name], Jamie [name] ([id number]) [id number] Page 3 of 12 6/20 patient went to see Dr. Durwin Nora. He did not think she had significant arterial disease. In terms of her venous duplex on the right side there was no evidence of a DVT or SVT there was deep venous reflux involving the common femoral vein no superficial vein reflux on the left side there was no DVT or SVT there was no deep vein graft reflux there was some reflux in the greater saphenous vein from the mid thigh to the knee but the vein here was not dilated. He thought these were venous wounds he prescribed a compression pump but I am not sure who we ordered it from The patient has been approved for Apligraf. Still using silver collagen this week 7/5; Apligraf #1 7/19 Apligraf #2. Decent improvement in the condition of the wound bed. Epithelialization distal 8/2 Apligraf #3. No issues or complaints. Denies signs of  infection. 8/17 Apligraf #4. No issues or concerns. Some complaints of pruritus and the rash. Our intake nurse brought up the fact that she had previously indicated possible cotton layer sensitivity we will therefore use kerlix in the bottom layer of the compression 8/30; the patient comes in with the area on her left medial leg just about healed. There is a superficial area more towards the tibia and a smaller open area distally everything else is epithelialized. I do not think she requires another Apligraf 9/13; left medial leg is healed. 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 Her edema control is good Readmisstion: 05-22-2022 upon evaluation today patient presents for initial inspection here in our clinic concerning issues that she has been having with a wound of the right lateral lower extremity. This is an area of a previous skin graft she tells me. With that being said she unfortunately had a scrape on this that occurred around 10 January. Since that time she has noted that this has just continued to get bigger and bigger in her niece who is present with her today actually states that 2 weeks ago when she saw that it was significantly smaller than what she sees currently. Obviously this is of utmost concern as we do not want this to continue to get larger when arrested and get moving in the right direction. Fortunately  there does not appear to be any signs of systemic infection though locally I think we probably do have some infection present. I would obtain a culture to see what we have going on here and then we will subsequently see where things go going forward. Fortunately I think that she is in the right place at this point being at the wound center we can definitely do something to try to get this moving in the right direction. Patient does have a history of chronic venous insufficiency as well as coronary artery disease. In  the past she has done well with compression wraps on the start with a 3 layer wrap I think she is probably can end up going to a 4-layer at some point but we will see how things do over the next week. She has been using wound cleanser along with she tells me a zinc and topical but again I am not sure exactly what that was. 05-29-2022 upon evaluation today patient appears to be doing well currently in regard to her wound. This actually is showing signs of improvement I am happy in that regard unfortunately her left leg has an area on the shin that has opened. This is the region where one of the areas at least we have previously taken care of. Nonetheless this is small hoping we get it under control before things worsen significantly. 06-05-2022 upon evaluation today patient appears to be doing well currently in regard to her wounds. The actually seem to be fairly clean she is having still quite a bit of problem with pain at this point she would like to not have debridement today for all possible. With that being said I do believe that we are making some good progress I think the [facility] is doing a decent job here. 3/6; patient has had 3/6; patient with known severe chronic venous insufficiency. She apparently had a traumatic wound at home and has a reasonably substantial wound on the right lateral lower leg and a smaller one on the left anterior lower leg as well. We have been using Hydrofera Blue and Unna boots 3/13; patient presents for follow-up. We have been using Hydrofera Blue under Coflex to the legs bilaterally. She has no issues or complaints today. 06-26-2022 upon evaluation today patient appears to be doing well currently in regard to her wound. This is measuring a little bit larger however. Fortunately I do not see any signs of infection this is on the right side. Fortunately the left side is actually completely healed which is great news. 07-03-2022 patient's wound unfortunately is  continuing to show signs of being worse. Her compression which was the Tubigrip that I thought should be able to pull up actually slipped down and she was not able to pull that back up. With that being said that means that unfortunately her wound has continued to deteriorate since I last saw her. This is definitely not the direction that we are looking for. I discussed with her that I do believe we need to go ahead and see about getting her started on antibiotics and I subsequently would also like to go ahead and see about getting things moving forward with regard to a wound culture and making a change up her dressings as well but this can be changed more frequently than just once a week. 07-10-2022 upon evaluation today patient actually appears to be doing much better. I do believe that the antibiotics have been beneficial for her. Fortunately I do not  see any signs of active infection locally nor systemically which is great news. No fevers, chills, nausea, vomiting, or diarrhea. 07-17-2022 upon evaluation today patient appears to be doing well currently in regard to her wound which is actually showing signs of improvement this is slow but nonetheless prevalent that we are seeing good improvement here. Fortunately I do not see any signs of active infection locally nor systemically which is great news. 07-24-2022 upon evaluation today patient appears to be doing well currently in regard to her wound although the wrap that we had on has been slipping down and she really does not have anybody to help her with this at home health is not going to be coming out we could not get anybody that would actually be able to do the dressing changes. Fortunately I do not see any signs of active infection locally nor systemically which is great news. 08-07-22 poorly in regard to her leg compared to what it was previous. Fortunately there does not appear to be any signs of active infection locally nor systemically which is  great news. No fevers, chills, nausea, vomiting, or diarrhea. With that being said it does appear that the patient may have some cellulitis in the leg which is not good. 08-14-2022 upon evaluation today patient appears to be doing somewhat better in regard to her wounds she still is having quite a bit of pain we are still not where we want to be as far as healing is concerned completely. Fortunately I do not see any evidence of active infection locally nor systemically which is great news and I am very pleased in that regard. 08-21-23 upon evaluation today patient appears to be doing poorly currently in regard to her wound. She has been tolerating the dressing changes without complication. Fortunately there does not appear to be any signs of active infection locally nor systemically at this time. 08-28-2022 upon evaluation today patient appears to be doing poorly in regard to her wound this was not wrapped appropriately home health did not go up high enough on the wrap. This has caused some issues and I discussed that with the patient today. I do believe that she is going to require aggressive wrapping and treatment which she is getting the [name] topical antibiotics today they can start using that at the next wrap on Friday. In the meantime they need to make sure that the rapid and appropriately we wrote very specific orders today. 09-04-2022 upon evaluation today patient appears to be doing well currently in regard to her wound which I think is making progress is still hurting her quite significantly. Fortunately I do not see any signs of active infection locally or systemically which is great news. No fevers, chills, nausea, vomiting, or diarrhea. 09-11-2022 upon evaluation today patient's wound actually is showing signs of being a little bit smaller and looking a little bit better she still has a lot going on here however. Fortunately I do not see any evidence of active infection Worsening locally nor  systemically which is great news. With that being said worsening still continue to use the topical Keystone antibiotics. 09-18-2022 upon evaluation today patient actually showing some signs of improvement. I am actually very pleased with where we stand compared to where we have been. I think that she is making good headway here. She is still having quite a bit of pain but it seems to be lessening compared to previous. 09-25-2022 upon evaluation patient's wound actually showing signs slowly but surely of  improvement. Fortunately I do not see any signs of active infection at this time systemically and locally I feel like this is dramatically improved. She is doing well with the [facility] topical antibiotics. Jamie [name], Jamie [name] ([id number]) [id number] Page 4 of 12 10-02-2022 upon evaluation today patient appears to be doing better in regard to her wound overall. I am very pleased with where things stand from a visual standpoint I do not see any signs of active infection and in general I think that we are moving in the right direction here. 10-09-2022 upon evaluation today patient appears to be doing well currently in regard to her wound. She has been tolerating the dressing changes without complication. Fortunately I do not see any signs of active infection at this time which is great news. 10-16-2022 upon evaluation today patient continues to have quite a bit of pain in regard to her right leg. We have been attempting to debride little by little as we could but again was pretty limited by the fact that she is having significant discomfort. We do not actually have any signs of active infection going on at this point that the [facility] topical antibiotics have been helping quite readily. I been attempting to do as much debridement as I can but if she were to have pain medication this would actually help and in the past when she did it was much more tolerable for her as far as taking  the edge off. Right now she has been without as she is in transition from her previous pain management physician to someone new to manage this. 10-23-2022 upon evaluation patient appears to be doing excellent in regard to her wound. She has been tolerating the dressing changes without complication. Fortunately I do not see any evidence of active infection locally nor systemically which is great news. No fevers, chills, nausea, vomiting, or diarrhea. 7/24; patient with a wound secondary to chronic venous insufficiency on the right lateral lower leg. We have been using Keystone antibiotic Hydrofera Blue under kerlix Coban. She complains of a lot of pain after last week's debridement otherwise things appear to be improved per discussion with our intake nurse. 11-06-2022 upon evaluation today patient appears to be doing excellent at this point in regard to her wound. She has been tolerating the dressing changes without complication and to be honest she is making excellent progress towards complete closure. I am actually very pleased with where we stand I think that she is doing excellent as far as the overall appearance of the wound is concerned as well. She is going require some debridement however. 11-20-2022 upon evaluation today patient appears to be doing well currently in regard to her wound. She has been tolerating the dressing changes without complication. Fortunately there does not appear to be any signs of active infection locally or systemically which is great news and in general I do believe that we are moving in the right direction here. No fevers, chills, nausea, vomiting, or diarrhea. 11-27-2022 upon evaluation today patient appears to be doing a little better in regards to the overall appearance of her wound but unfortunately she is having increased pain. The collagen seems to be getting very dry and stuck to the wound bed which is causing her some issues here. Fortunately I do not see  any signs of active infection locally nor systemically at this time. Fortunately I think that her wound is better but unfortunately her pain has not. For that reason I am going to avoid any  debridement today since the patient is very upset about the pain and how bad this is hurting at this point. I think we can have to try something a little bit different I am thinking PolyMem may be a good option. 8/28; this patient has difficult venous wounds on the right lateral lower leg and ankle in the setting of chronic venous insufficiency. We have been using polymen under compression with kerlix Coban. 12-11-2022 upon evaluation today patient appears to be doing well currently in regard to her wound. She has been tolerating the dressing changes without complication and actually appears to be doing much better this week compared to when I saw her 2 weeks ago. The wound is measuring significantly smaller. We are using PolyMem along with Kerlix and Coban. 12-18-2022 upon evaluation today patient appears to be doing well currently in regard to her wound. This is actually showing signs of improvement is measuring a little bit smaller and looking better. Fortunately I do not see any evidence of worsening overall and I believe that we will making good headway with the PolyMem and the [facility] topical antibiotics. 12-25-2022 upon evaluation today patient appears to be doing well currently in regard to her wound. She has been tolerating the dressing changes without complication. Fortunately I do not see any evidence of infection locally or systemically which is great news and in general I do believe that we will make an excellent headway towards complete closure also excellent news. 01-01-2023 upon evaluation patient's wound actually showing signs of improvement the wrap was actually put on properly this week and this is good news. Overall I am extremely happy with where things stand and how this appears today I do not  see any signs of worsening overall and I believe that the patient is making excellent headway towards complete closure which is great news. 01/08/2023 upon evaluation today patient appears to be doing well currently in regard to her leg. She is actually draining much less unfortunately home health is just not doing very well getting the supplies necessary in order to continue to take care of the patient. They are now telling me they cannot get the PolyMem which is something that has been doing really well for her. That really does not make sense to me you came to get PolyMem for a hospice patient. Nonetheless either way I think we may need to just discontinue treatment with home health and just manage the patient here at the clinic. 01-15-2023 upon evaluation today patient appears to be doing well currently in regard to her wound. She has been tolerating the dressing changes without complication. Fortunately I do not see any signs of infection I think she is doing quite well and very pleased with where we stand today. No fevers, chills, nausea, vomiting, or diarrhea. 01-22-2023 upon evaluation today patient appears to be doing well currently in regard to her wound. She has been tolerating the dressing changes without complication and both wounds are measuring smaller today and look to be doing excellent. I am actually very pleased with what ever standing here and I think that she is making really good headway towards closure which is great news. 01-29-2023 upon evaluation today patient actually tells me she has been having some increased pain. Subsequently it took a little bit of drilling down but in the end I realized that we actually ended up putting a full Unna boot on her last week as opposed to just using a good anchor at the top and bottom.  I think this is what wound up with her having increased discomfort because she tells me as she was doing well when she came in last time and by the time she  left she tells me this was already getting significantly worse. And she hurt most of the week. With that being said I do believe that the patient is doing quite well at this point with regard to her wounds and the wrap did okay it was not a problem other than the increased pain I think we may benefit from a Urgo K2 light compression wrap as this will be much more comfortable I think compared to what we have been doing with just the Kerlix Coban anyway. 02-05-2023 upon evaluation today patient appears to be doing well currently in regard to her wounds. She has been tolerating the dressing changes without complication. Fortunately I do not see any signs of active infection looking systemically which is great news. No fevers, chills, nausea, vomiting, or diarrhea. 11/6; patient I remember from a previous stay in this clinic With a left lower extremity wound.. She has a right lower extremity venous wound. She has been using Keystone, polymen under [employer]. Her wound now is to remaining open areas. There is no evidence of infection although she is tender in the area. I wonder whether there is stasis dermatitis here. She reminds me that she had some form of skin graft on this area during his stay at the [facility] wound care center presumably in around 2022. 11/13; difficult wound on the right lateral lower extremity. She has a history of venous wounds in this area and a history of a skin graft in this area. She can planes about a lot of pain but she has not been systemically unwell. No fever chills etc. 11/20; continue difficult wounds on the right lateral lower extremity. She has a history of venous wounds in this area and a history of skin graft in this area. She continues to complain of pain. She comes in today with completely nonviable surfaces. She has been using [employer] under and Urgo K2 light 12/3; significant improvement in wound dimensions still 2 open wounds which were initially part of  the larger single wound. We have been using Keystone polymen under [employer]. The wounds are smaller today. The surface does not look as viable as I might like although she does not tolerate mechanical debridement at all 12/11; 2 open wounds 1 on the right anterior and 1 on the lateral lower leg. We have been using Keystone polymen under Urgo K2 lite's. The wounds are measuring smaller. Jamie [name], Jamie [name] ([id number]) [id number] Page 5 of 12 12/18; right anterior and right lateral lower leg.. 2 open areas. Somewhat smaller we have been using Keystone polymen under Urgo K2 lite's. The surrounding area is mostly scar tissue from previous skin grafts in this area. 1. We are going to continue with the polymen Ag ABDs and Urgo K2's. He is doing nicely on both sides. 2. Again we talked about compression stockings when these have healed. I think he is going to require 30/40 mm of equivalent compression. Juxta lite stockings would be a good alternative but he seemed more interested in standard over the toe stockings 04/10/2023. The more anterior wound has completely closed and this posterior wound is down half the size. We have been using polymen Ag under compression.Marland Kitchen She will need stockings I am not sure which she has when this closes which may be as  early as the next week or 2 04/23/2023; patient missed her last clinic appointment due to having COVID. Her daughter attempted to change the dressing. We have been using PolyMem silver under Kerlix/Coban. Wound is smaller today. She denies signs of infection. Electronic Signature(s) Signed: 04/23/2023 4:27:59 PM By: Jamie Corwin DO Entered By: Jamie [name] on 04/23/2023 12:58:27 -------------------------------------------------------------------------------- Physical Exam Details Patient Name: Date of Service: Jamie Mart W. 04/23/2023 11:15 A M Medical Record Number: [id number] Patient Account Number:  [id number] Date of Birth/Sex: Treating RN: [birth year] (83 y.o. F) Primary Care Provider: Belva [name] Other Clinician: Referring Provider: Treating Provider/Extender: Jamie [name] in Treatment: 48 Constitutional respirations regular, non-labored and within target range for patient.. Cardiovascular 2+ dorsalis pedis/posterior tibialis pulses. Psychiatric pleasant and cooperative. Notes Open wound to the right lateral/posterior aspect of the lower extremity with granulation tissue and nonviable tissue. No surrounding signs of infection including increased warmth, erythema or purulent drainage. Electronic Signature(s) Signed: 04/23/2023 4:27:59 PM By: Jamie Corwin DO Entered By: Jamie [name] on 04/23/2023 12:59:17 -------------------------------------------------------------------------------- Physician Orders Details Patient Name: Date of Service: Jamie [name], Jamie [name] 04/23/2023 11:15 A M Medical Record Number: [id number] Patient Account Number: [id number] Date of Birth/Sex: Treating RN: [birth year] (83 y.o. Jamie [name] Primary Care Provider: Belva [name] Other Clinician: Referring Provider: Treating Provider/Extender: Jamie [name] in Treatment: 48 The following information was scribed by: Jamie [name] The information was scribed for: Jamie [name] Verbal / Phone Orders: No Diagnosis Coding Follow-up Appointments ppointment in 1 week. - 04/16/2023 1115 AM (already scheduled) Return A Jamie [name], Jamie [name] ([id number]) [id number] Page 6 of 12 ppointment in 2 weeks. - 04/23/2023 111 (already scheduled) Return A Other: - you will need to wear compression stockings for right leg once it heals. Bathing/ Shower/ Hygiene May shower and wash wound with soap and water. - with dressing changes. Edema Control - Orders / Instructions Elevate legs to the level of the heart or above for 30 minutes daily  and/or when sitting for 3-4 times a day throughout the day. Avoid standing for long periods of time. Exercise regularly - As tolerated Wound Treatment Wound #7 - Lower Leg Wound Laterality: Right, Posterior Cleanser: Soap and Water 1 x Per Week/30 Days Discharge Instructions: May shower and wash wound with dial antibacterial soap and water prior to dressing change. Peri-Wound Care: Sween Lotion (Moisturizing lotion) 1 x Per Week/30 Days Discharge Instructions: Apply moisturizing lotion as directed Topical: Gentamicin 1 x Per Week/30 Days Discharge Instructions: As directed by physician Prim Dressing: PolyMem Silver Non-Adhesive Dressing, 4.25x4.25 in 1 x Per Week/30 Days ary Discharge Instructions: Apply to wound bed as instructed Secondary Dressing: ABD Pad, 8x10 1 x Per Week/30 Days Discharge Instructions: Apply over primary dressing as directed. Secondary Dressing: Woven Gauze Sponge, Non-Sterile 4x4 in 1 x Per Week/30 Days Discharge Instructions: Apply over primary dressing as directed. Compression Wrap: Kerlix Roll 4.5x3.1 (in/yd) 1 x Per Week/30 Days Discharge Instructions: Apply Kerlix and Coban compression as directed. Compression Wrap: Coban Self-Adherent Wrap 4x5 (in/yd) 1 x Per Week/30 Days Discharge Instructions: Apply over Kerlix as directed. Compression Wrap: unna boot FIRST LAYER **** SEE INSTRUCTIONS 1 x Per Week/30 Days Discharge Instructions: APPLY FIRST LAYER UNNA BOOT AT BASES OF TOES AND JUST BELOW THE KNEE TO HOLD COMPRESSION WRAP IN PLACE. Electronic Signature(s) Signed: 04/23/2023 4:27:59 PM By: Jamie Corwin DO Entered By: Jamie [name] on 04/23/2023 12:59:29 -------------------------------------------------------------------------------- Problem List Details Patient Name: Date of Service: Jamie Brink,  MA Jamie W. 04/23/2023 11:15 A M Medical Record Number: [id number] Patient Account Number: [id number] Date of Birth/Sex: Treating RN: March 13, 1941 (83 y.o.  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 05/22/2022 No Yes lower extremity L97.812 Non-pressure chronic ulcer of other part of right lower leg with fat layer 05/22/2022 No Yes exposed AHONESTY, VIELE ([id number]) [id number] Page 7 of 12 I25.10 Atherosclerotic heart disease of native coronary artery without angina pectoris 05/22/2022 No Yes Inactive Problems Resolved Problems Electronic Signature(s) Signed: 04/23/2023 4:27:59 PM By: Jamie Corwin DO Entered By: Jamie [name] on 04/23/2023 12:56:53 -------------------------------------------------------------------------------- Progress Note Details Patient Name: Date of Service: Jamie [name], Jamie [name] 04/23/2023 11:15 A M Medical Record Number: [id number] Patient Account Number: [id number] Date of Birth/Sex: Treating RN: 20-Oct-1940 (83 y.o. F) Primary Care Provider: Belva [name] Other Clinician: Referring Provider: Treating Provider/Extender: Jamie [name] in Treatment: 48 Subjective Chief Complaint Information obtained from Patient Right LE Ulcer History of Present Illness (HPI) ADMISSION 06/30/2020 Jamie [name] is an 83 year old woman who lives in [place]. She is here with her niece for review of wounds on the left medial lower leg and ankle. These have apparently been present for over a year and she followed with Dr. Olegario [name] at the [facility] in [place] for quite a period of time although it looks as though there was a initial consult wound from Dr. Marcha [name] on February 18 presumably there was therefore hiatus. At that point the wounds were described as being there for 3 months. She also  tells me she was at the wound care center in [place] for a period of time with this. 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 She had venous studies that were negative for DVT ABIs on the right were 1.01 on the left 1.06. She has . had previous applications of puraply, compression which she does not tolerate very well. She has had several rounds of oral antibiotic therapy. She complains of unrelenting pain and she is seeing Dr. Reece Agar [name] of pain management apparently was on oxycodone but that did not help. She also had a skin biopsy done by Dr. Olegario [name] although we do not have that result. She does not appear to have an arterial issue. I am not completely clear what she has been putting on the wounds lately. 3/31; this patient has a particularly nasty set of wounds on the left medial ankle in the middle of what looks to be hemosiderin deposition secondary to chronic venous insufficiency. She has a lot of pain followed by pain management. Dr. Marcha [name] apparently did a biopsy of something on the left leg last fall what I would like to get this result. She has a history of MRSA treatment. I do not believe she had reflux studies but she did have DVT rule out studies. Previous ABIs have not suggested arterial insufficiency 4/7; difficult wounds on the left medial ankle probably chronic venous insufficiency. With considerable effort on behalf of our case manager we were able to finally to speak to somebody at the hospital in [place] who indicated that no biopsy of this area have been done even though the patient describes this in some detail and is even able to point out where she thinks the biopsy was done. We have  been using Sorbact. The PCR culture I did showed polymicrobial identification with Pseudomonas, staph aureus, Peptostreptococcus. All of this and low titers. Resistance genes identified were MRSA, staph virulence gene and tetracycline. We are going to  send this to [facility] for a topical antibiotic which is something that we have had good success with recently in large venous ulcers with a lot of purulent drainage. There would not be an easy oral alternative here possibly line escalated and ciprofloxacin if we need to use systemic antibiotic 4/15; difficult area on the left medial ankle. Most likely chronic venous insufficiency. I think she will probably need venous reflux study I think she had DVT rule outs but not venous reflux studies. We have not yet obtained the topical antibiotics. She has home health changing the dressing we have been using Sorbact for adherent fibrinous debris on the surface. Very difficult to remove 4/22; patient presents for 1 week follow-up. She has been using sore back under compression wraps and these are changed 3 times a week with home health. She also had Keystone antibiotics sent to her house and brought them in today. She has no complaints or issues today. 5/2; patient is here for follow-up. She has been using Sorbact under compression. Very painful wound. She has been using Keystone antibiotics. Not much improvement although the more medial part of the wound has cleaned up nicely and the larger part of the wound about 50% slough covered. Part of the issue here is that she had stays at both [facility] wound care center, [name] wound care center and now [place]. Not sure if she has had venous reflux studies. As far as we are able to tell she did not have a biopsy. My notes state that she did not have venous reflux studies just DVT rule outs. 5/16; patient goes for venous reflux studies this afternoon. She says that wound was biopsied which sounds like punch biopsies by Dr. Lynden Ang we do not have these results. We are using Sorbact. Very difficult wound to debride 5/23; patient presents for 1 week follow-up. She reports tolerating the wraps well with sorbact underneath. She had ABIs and venous reflux studies done. She has no  issues or complaints today. She denies acute signs of infection. Jamie [name], Jamie [name] ([id number]) [id number] Page 8 of 12 6/6; I have reviewed the patient's vascular studies. Wounds are on the left medial and posterior calf. She had significant reflux in the greater saphenous vein in the in the mid thigh, distal thigh knee and the small saphenous vein in the popliteal fossa. The vein diameters do not look too impressive though. She is going to see the vascular surgeon on Wednesday. She also had venous reflux in the right common femoral vein. She did not have any evidence of a DVT or SVT I am wondering whether there is an ablation procedure that would benefit her in the greater saphenous vein on the left. . She tells [place] that home health put the dressing on too tight and she took off 1 layer. The swelling in her left leg is a little worse as a result of this. Not much change in the wound measurements so the surface of the wound looks better Her arterial studies showed an ABI on the right of 1.14 with a triphasic waveform and a great toe pressure of 0.89. On the left her ABIs were noncompressible at 1.34 but with triphasic waveforms and a TBI of 0.97. Her greater toe pressure was 122. There was no evidence of  significant bilateral arterial disease 6/20 patient went to see Dr. Durwin Nora. He did not think she had significant arterial disease. In terms of her venous duplex on the right side there was no evidence of a DVT or SVT there was deep venous reflux involving the common femoral vein no superficial vein reflux on the left side there was no DVT or SVT there was no deep vein graft reflux there was some reflux in the greater saphenous vein from the mid thigh to the knee but the vein here was not dilated. He thought these were venous wounds he prescribed a compression pump but I am not sure who we ordered it from The patient has been approved for Apligraf. Still using silver  collagen this week 7/5; Apligraf #1 7/19 Apligraf #2. Decent improvement in the condition of the wound bed. Epithelialization distal 8/2 Apligraf #3. No issues or complaints. Denies signs of infection. 8/17 Apligraf #4. No issues or concerns. Some complaints of pruritus and the rash. Our intake nurse brought up the fact that she had previously indicated possible cotton layer sensitivity we will therefore use kerlix in the bottom layer of the compression 8/30; the patient comes in with the area on her left medial leg just about healed. There is a superficial area more towards the tibia and a smaller open area distally everything else is epithelialized. I do not think she requires another Apligraf 9/13; left medial leg is healed. 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 Her edema control is good Readmisstion: 05-22-2022 upon evaluation today patient presents for initial inspection here in our clinic concerning issues that she has been having with a wound of the right lateral lower extremity. This is an area of a previous skin graft she tells me. With that being said she unfortunately had a scrape on this that occurred around 10 January. Since that time she has noted that this has just continued to get bigger and bigger in her niece who is present with her today actually states that 2 weeks ago when she saw that it was significantly smaller than what she sees currently. Obviously this is of utmost concern as we do not want this to continue to get larger when arrested and get moving in the right direction. Fortunately there does not appear to be any signs of systemic infection though locally I think we probably do have some infection present. I would obtain a culture to see what we have going on here and then we will subsequently see where things go going forward. Fortunately I think that she is in the right place at this point being at the  wound center we can definitely do something to try to get this moving in the right direction. Patient does have a history of chronic venous insufficiency as well as coronary artery disease. In the past she has done well with compression wraps on the start with a 3 layer wrap I think she is probably can end up going to a 4-layer at some point but we will see how things do over the next week. She has been using wound cleanser along with she tells me a zinc and topical but again I am not sure exactly what that was. 05-29-2022 upon evaluation today patient appears to be doing well currently in regard to her wound. This actually is showing signs of improvement I am happy in that regard unfortunately her left leg has an area on the shin  that has opened. This is the region where one of the areas at least we have previously taken care of. Nonetheless this is small hoping we get it under control before things worsen significantly. 06-05-2022 upon evaluation today patient appears to be doing well currently in regard to her wounds. The actually seem to be fairly clean she is having still quite a bit of problem with pain at this point she would like to not have debridement today for all possible. With that being said I do believe that we are making some good progress I think the [name] is doing a decent job here. 3/6; patient has had 3/6; patient with known severe chronic venous insufficiency. She apparently had a traumatic wound at home and has a reasonably substantial wound on the right lateral lower leg and a smaller one on the left anterior lower leg as well. We have been using Hydrofera Blue and Unna boots 3/13; patient presents for follow-up. We have been using Hydrofera Blue under [employer] to the legs bilaterally. She has no issues or complaints today. 06-26-2022 upon evaluation today patient appears to be doing well currently in regard to her wound. This is measuring a little bit larger however.  Fortunately I do not see any signs of infection this is on the right side. Fortunately the left side is actually completely healed which is great news. 07-03-2022 patient's wound unfortunately is continuing to show signs of being worse. Her compression which was the Tubigrip that I thought should be able to pull up actually slipped down and she was not able to pull that back up. With that being said that means that unfortunately her wound has continued to deteriorate since I last saw her. This is definitely not the direction that we are looking for. I discussed with her that I do believe we need to go ahead and see about getting her started on antibiotics and I subsequently would also like to go ahead and see about getting things moving forward with regard to a wound culture and making a change up her dressings as well but this can be changed more frequently than just once a week. 07-10-2022 upon evaluation today patient actually appears to be doing much better. I do believe that the antibiotics have been beneficial for her. Fortunately I do not see any signs of active infection locally nor systemically which is great news. No fevers, chills, nausea, vomiting, or diarrhea. 07-17-2022 upon evaluation today patient appears to be doing well currently in regard to her wound which is actually showing signs of improvement this is slow but nonetheless prevalent that we are seeing good improvement here. Fortunately I do not see any signs of active infection locally nor systemically which is great news. 07-24-2022 upon evaluation today patient appears to be doing well currently in regard to her wound although the wrap that we had on has been slipping down and she really does not have anybody to help her with this at home health is not going to be coming out we could not get anybody that would actually be able to do the dressing changes. Fortunately I do not see any signs of active infection locally nor systemically  which is great news. 08-07-22 poorly in regard to her leg compared to what it was previous. Fortunately there does not appear to be any signs of active infection locally nor systemically which is great news. No fevers, chills, nausea, vomiting, or diarrhea. With that being said it does appear that the  patient may have some cellulitis in the leg which is not good. 08-14-2022 upon evaluation today patient appears to be doing somewhat better in regard to her wounds she still is having quite a bit of pain we are still not where we want to be as far as healing is concerned completely. Fortunately I do not see any evidence of active infection locally nor systemically which is great news and I am very pleased in that regard. 08-21-23 upon evaluation today patient appears to be doing poorly currently in regard to her wound. She has been tolerating the dressing changes without complication. Fortunately there does not appear to be any signs of active infection locally nor systemically at this time. 08-28-2022 upon evaluation today patient appears to be doing poorly in regard to her wound this was not wrapped appropriately home health did not go up high enough on the wrap. This has caused some issues and I discussed that with the patient today. I do believe that she is going to require aggressive wrapping and treatment which she is getting the [facility] topical antibiotics today they can start using that at the next wrap on Friday. In the meantime they need to make sure that the rapid and appropriately we wrote very specific orders today. 09-04-2022 upon evaluation today patient appears to be doing well currently in regard to her wound which I think is making progress is still hurting her quite significantly. Fortunately I do not see any signs of active infection locally or systemically which is great news. No fevers, chills, nausea, vomiting, or diarrhea. Jamie [name], Jamie [name] ([id number])  [id number] Page 9 of 12 09-11-2022 upon evaluation today patient's wound actually is showing signs of being a little bit smaller and looking a little bit better she still has a lot going on here however. Fortunately I do not see any evidence of active infection Worsening locally nor systemically which is great news. With that being said worsening still continue to use the topical Keystone antibiotics. 09-18-2022 upon evaluation today patient actually showing some signs of improvement. I am actually very pleased with where we stand compared to where we have been. I think that she is making good headway here. She is still having quite a bit of pain but it seems to be lessening compared to previous. 09-25-2022 upon evaluation patient's wound actually showing signs slowly but surely of improvement. Fortunately I do not see any signs of active infection at this time systemically and locally I feel like this is dramatically improved. She is doing well with the [name] topical antibiotics. 10-02-2022 upon evaluation today patient appears to be doing better in regard to her wound overall. I am very pleased with where things stand from a visual standpoint I do not see any signs of active infection and in general I think that we are moving in the right direction here. 10-09-2022 upon evaluation today patient appears to be doing well currently in regard to her wound. She has been tolerating the dressing changes without complication. Fortunately I do not see any signs of active infection at this time which is great news. 10-16-2022 upon evaluation today patient continues to have quite a bit of pain in regard to her right leg. We have been attempting to debride little by little as we could but again was pretty limited by the fact that she is having significant discomfort. We do not actually have any signs of active infection going on at this point that the [name] topical antibiotics have  been  helping quite readily. I been attempting to do as much debridement as I can but if she were to have pain medication this would actually help and in the past when she did it was much more tolerable for her as far as taking the edge off. Right now she has been without as she is in transition from her previous pain management physician to someone new to manage this. 10-23-2022 upon evaluation patient appears to be doing excellent in regard to her wound. She has been tolerating the dressing changes without complication. Fortunately I do not see any evidence of active infection locally nor systemically which is great news. No fevers, chills, nausea, vomiting, or diarrhea. 7/24; patient with a wound secondary to chronic venous insufficiency on the right lateral lower leg. We have been using Keystone antibiotic Hydrofera Blue under kerlix Coban. She complains of a lot of pain after last week's debridement otherwise things appear to be improved per discussion with our intake nurse. 11-06-2022 upon evaluation today patient appears to be doing excellent at this point in regard to her wound. She has been tolerating the dressing changes without complication and to be honest she is making excellent progress towards complete closure. I am actually very pleased with where we stand I think that she is doing excellent as far as the overall appearance of the wound is concerned as well. She is going require some debridement however. 11-20-2022 upon evaluation today patient appears to be doing well currently in regard to her wound. She has been tolerating the dressing changes without complication. Fortunately there does not appear to be any signs of active infection locally or systemically which is great news and in general I do believe that we are moving in the right direction here. No fevers, chills, nausea, vomiting, or diarrhea. 11-27-2022 upon evaluation today patient appears to be doing a little better in regards to  the overall appearance of her wound but unfortunately she is having increased pain. The collagen seems to be getting very dry and stuck to the wound bed which is causing her some issues here. Fortunately I do not see any signs of active infection locally nor systemically at this time. Fortunately I think that her wound is better but unfortunately her pain has not. For that reason I am going to avoid any debridement today since the patient is very upset about the pain and how bad this is hurting at this point. I think we can have to try something a little bit different I am thinking PolyMem may be a good option. 8/28; this patient has difficult venous wounds on the right lateral lower leg and ankle in the setting of chronic venous insufficiency. We have been using polymen under compression with kerlix Coban. 12-11-2022 upon evaluation today patient appears to be doing well currently in regard to her wound. She has been tolerating the dressing changes without complication and actually appears to be doing much better this week compared to when I saw her 2 weeks ago. The wound is measuring significantly smaller. We are using PolyMem along with Kerlix and Coban. 12-18-2022 upon evaluation today patient appears to be doing well currently in regard to her wound. This is actually showing signs of improvement is measuring a little bit smaller and looking better. Fortunately I do not see any evidence of worsening overall and I believe that we will making good headway with the PolyMem and the [facility] topical antibiotics. 12-25-2022 upon evaluation today patient appears to be doing well currently in  regard to her wound. She has been tolerating the dressing changes without complication. Fortunately I do not see any evidence of infection locally or systemically which is great news and in general I do believe that we will make an excellent headway towards complete closure also excellent news. 01-01-2023 upon evaluation  patient's wound actually showing signs of improvement the wrap was actually put on properly this week and this is good news. Overall I am extremely happy with where things stand and how this appears today I do not see any signs of worsening overall and I believe that the patient is making excellent headway towards complete closure which is great news. 01/08/2023 upon evaluation today patient appears to be doing well currently in regard to her leg. She is actually draining much less unfortunately home health is just not doing very well getting the supplies necessary in order to continue to take care of the patient. They are now telling me they cannot get the PolyMem which is something that has been doing really well for her. That really does not make sense to me you came to get PolyMem for a hospice patient. Nonetheless either way I think we may need to just discontinue treatment with home health and just manage the patient here at the clinic. 01-15-2023 upon evaluation today patient appears to be doing well currently in regard to her wound. She has been tolerating the dressing changes without complication. Fortunately I do not see any signs of infection I think she is doing quite well and very pleased with where we stand today. No fevers, chills, nausea, vomiting, or diarrhea. 01-22-2023 upon evaluation today patient appears to be doing well currently in regard to her wound. She has been tolerating the dressing changes without complication and both wounds are measuring smaller today and look to be doing excellent. I am actually very pleased with what ever standing here and I think that she is making really good headway towards closure which is great news. 01-29-2023 upon evaluation today patient actually tells me she has been having some increased pain. Subsequently it took a little bit of drilling down but in the end I realized that we actually ended up putting a full Unna boot on her last week as  opposed to just using a good anchor at the top and bottom. I think this is what wound up with her having increased discomfort because she tells me as she was doing well when she came in last time and by the time she left she tells me this was already getting significantly worse. And she hurt most of the week. With that being said I do believe that the patient is doing quite well at this point with regard to her wounds and the wrap did okay it was not a problem other than the increased pain I think we may benefit from a Urgo K2 light compression wrap as this will be much more comfortable I think compared to what we have been doing with just the Kerlix Coban anyway. 02-05-2023 upon evaluation today patient appears to be doing well currently in regard to her wounds. She has been tolerating the dressing changes without complication. Fortunately I do not see any signs of active infection looking systemically which is great news. No fevers, chills, nausea, vomiting, or diarrhea. 11/6; patient I remember from a previous stay in this clinic With a left lower extremity wound.. She has a right lower extremity venous wound. She has been using Keystone, polymen under [employer]  lite. Her wound now is to remaining open areas. There is no evidence of infection although she is tender in the area. I wonder whether there is stasis dermatitis here. She reminds me that she had some form of skin graft on this area during his stay at the [facility] wound care center presumably in around 2022. 11/13; difficult wound on the right lateral lower extremity. She has a history of venous wounds in this area and a history of a skin graft in this area. She can planes about a lot of pain but she has not been systemically unwell. No fever chills etc. Jamie [name], Jamie [name] ([id number]) [id number] Page 10 of 12 11/20; continue difficult wounds on the right lateral lower extremity. She has a history of venous wounds in  this area and a history of skin graft in this area. She continues to complain of pain. She comes in today with completely nonviable surfaces. She has been using [employer] under and Urgo K2 light 12/3; significant improvement in wound dimensions still 2 open wounds which were initially part of the larger single wound. We have been using Keystone polymen under [employer]. The wounds are smaller today. The surface does not look as viable as I might like although she does not tolerate mechanical debridement at all 12/11; 2 open wounds 1 on the right anterior and 1 on the lateral lower leg. We have been using Keystone polymen under Urgo K2 lite's. The wounds are measuring smaller. 12/18; right anterior and right lateral lower leg.. 2 open areas. Somewhat smaller we have been using Keystone polymen under Urgo K2 lite's. The surrounding area is mostly scar tissue from previous skin grafts in this area. 1. We are going to continue with the polymen Ag ABDs and Urgo K2's. He is doing nicely on both sides. 2. Again we talked about compression stockings when these have healed. I think he is going to require 30/40 mm of equivalent compression. Juxta lite stockings would be a good alternative but he seemed more interested in standard over the toe stockings 04/10/2023. The more anterior wound has completely closed and this posterior wound is down half the size. We have been using polymen Ag under compression.Marland Kitchen She will need stockings I am not sure which she has when this closes which may be as early as the next week or 2 04/23/2023; patient missed her last clinic appointment due to having COVID. Her daughter attempted to change the dressing. We have been using PolyMem silver under Kerlix/Coban. Wound is smaller today. She denies signs of infection. Objective Constitutional respirations regular, non-labored and within target range for patient.. Vitals Time Taken: 11:39 AM, Height: 62 in, Weight: 171 lbs,  BMI: 31.3, Temperature: 98.3 F, Pulse: 66 bpm, Respiratory Rate: 18 breaths/min, Blood Pressure: 135/88 mmHg. Cardiovascular 2+ dorsalis pedis/posterior tibialis pulses. Psychiatric pleasant and cooperative. General Notes: Open wound to the right lateral/posterior aspect of the lower extremity with granulation tissue and nonviable tissue. No surrounding signs of infection including increased warmth, erythema or purulent drainage. Integumentary (Hair, Skin) Wound #7 status is Open. Original cause of wound was Gradually Appeared. The date acquired was: 01/01/2023. The wound has been in treatment 16 weeks. The wound is located on the Right,Posterior Lower Leg. The wound measures 0.7cm length x 0.7cm width x 0.1cm depth; 0.385cm^2 area and 0.038cm^3 volume. There is Fat Layer (Subcutaneous Tissue) exposed. There is a medium amount of serosanguineous drainage noted. The wound margin is thickened. There is large (67-100%) red, pink granulation  within the wound bed. There is a small (1-33%) amount of necrotic tissue within the wound bed including Adherent Slough. The periwound skin appearance exhibited: Dry/Scaly. The periwound skin appearance did not exhibit: Callus, Crepitus, Excoriation, Induration, Rash, Scarring, Maceration, Atrophie Blanche, Cyanosis, Ecchymosis, Hemosiderin Staining, Mottled, Pallor, Rubor, Erythema. Periwound temperature was noted as No Abnormality. The periwound has tenderness on palpation. 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 There was slough accumulation at the surface and I debrided this. Healthy granulation tissue postdebridement. No signs of surrounding infection. I did recommend  continuing PolyMem silver but adding antibiotic ointment to address any bioburden. Continue compression wrap. Follow-up in 1 week. Procedures Wound #7 Pre-procedure diagnosis of Wound #7 is a Venous Leg Ulcer located on the Right,Posterior Lower Leg .Severity of Tissue Pre Debridement is: Fat layer exposed. There was a Selective/Open Wound Non-Viable Tissue Debridement with a total area of 0.38 sq cm performed by Jamie Corwin, DO. With the following instrument(s): Curette to remove Non-Viable tissue/material. Material removed includes Slough after achieving pain control using Lidocaine 4% Topical Jamie [name], Jamie [name] ([id number]) [phone].pdf Page 11 of 12 Solution. No specimens were taken. A time out was conducted at 12:16, prior to the start of the procedure. A Minimum amount of bleeding was controlled with Pressure. The procedure was tolerated well with a pain level of 0 throughout and a pain level of 0 following the procedure. Post Debridement Measurements: 0.7cm length x 0.7cm width x 0.1cm depth; 0.038cm^3 volume. Character of Wound/Ulcer Post Debridement is stable. Severity of Tissue Post Debridement is: Fat layer exposed. Post procedure Diagnosis Wound #7: Same as Pre-Procedure Pre-procedure diagnosis of Wound #7 is a Venous Leg Ulcer located on the Right,Posterior Lower Leg . There was a Double Layer Compression Therapy Procedure by Jamie Aver, RN. Post procedure Diagnosis Wound #7: Same as Pre-Procedure Plan Follow-up Appointments: Return Appointment in 1 week. - 04/16/2023 1115 AM (already scheduled) Return Appointment in 2 weeks. - 04/23/2023 111 (already scheduled) Other: - you will need to wear compression stockings for right leg once it heals. Bathing/ Shower/ Hygiene: May shower and wash wound with soap and water. - with dressing changes. Edema Control - Orders / Instructions: Elevate legs to the level of the heart or above for 30 minutes daily  and/or when sitting for 3-4 times a day throughout the day. Avoid standing for long periods of time. Exercise regularly - As tolerated WOUND #7: - Lower Leg Wound Laterality: Right, Posterior Cleanser: Soap and Water 1 x Per Week/30 Days Discharge Instructions: May shower and wash wound with dial antibacterial soap and water prior to dressing change. Peri-Wound Care: Sween Lotion (Moisturizing lotion) 1 x Per Week/30 Days Discharge Instructions: Apply moisturizing lotion as directed Topical: Gentamicin 1 x Per Week/30 Days Discharge Instructions: As directed by physician Prim Dressing: PolyMem Silver Non-Adhesive Dressing, 4.25x4.25 in 1 x Per Week/30 Days ary Discharge Instructions: Apply to wound bed as instructed Secondary Dressing: ABD Pad, 8x10 1 x Per Week/30 Days Discharge Instructions: Apply over primary dressing as directed. Secondary Dressing: Woven Gauze Sponge, Non-Sterile 4x4 in 1 x Per Week/30 Days Discharge Instructions: Apply over primary dressing as directed. Com pression Wrap: Kerlix Roll 4.5x3.1 (in/yd) 1 x Per Week/30 Days Discharge Instructions: Apply Kerlix and Coban compression as directed.  Com pression Wrap: Coban Self-Adherent Wrap 4x5 (in/yd) 1 x Per Week/30 Days Discharge Instructions: Apply over Kerlix as directed. Com pression Wrap: unna boot FIRST LAYER **** SEE INSTRUCTIONS 1 x Per Week/30 Days Discharge Instructions: APPLY FIRST LAYER UNNA BOOT AT BASES OF TOES AND JUST BELOW THE KNEE TO HOLD COMPRESSION WRAP IN PLACE. 1. In office sharp debridement 2. PolyMem silver with antibiotic ointment under compression wrap to the right lower extremity 3. Follow-up in 1 week Electronic Signature(s) Signed: 04/23/2023 4:27:59 PM By: Jamie Corwin DO Entered By: Jamie [name] on 04/23/2023 13:00:52 -------------------------------------------------------------------------------- SuperBill Details Patient Name: Date of Service: Jamie [name], Christean Grief.  04/23/2023 Medical Record Number: [id number] Patient Account Number: [id number] Date of Birth/Sex: Treating RN: December 29, 1940 (83 y.o. F) Primary Care Provider: Belva [name] Other Clinician: Referring Provider: Treating Provider/Extender: Jamie [name] in Treatment: 48 Diagnosis Coding ICD-10 Codes Code Description [phone] Chronic venous hypertension (idiopathic) with ulcer and inflammation of right lower extremity LLESENIA, TROTH ([id number]) [id number] Page 12 of 12 [phone] Non-pressure chronic ulcer of other part of right lower leg with fat layer exposed I25.10 Atherosclerotic heart disease of native coronary artery without angina pectoris Facility Procedures : CPT4 Code: [id number] Description: [phone] - DEBRIDE WOUND 1ST 20 SQ CM OR < ICD-10 Diagnosis Description I87.331 Chronic venous hypertension (idiopathic) with ulcer and inflammation of right lo L97.812 Non-pressure chronic ulcer of other part of right lower leg with fat layer  expos Modifier: wer extremity ed Quantity: 1 Physician Procedures : CPT4 Code Description Modifier [id number] - WC PHYS DEBR WO ANESTH 20 SQ CM ICD-10 Diagnosis Description I87.331 Chronic venous hypertension (idiopathic) with ulcer and inflammation of right lower extremity L97.812 Non-pressure chronic ulcer of  other part of right lower leg with fat layer exposed Quantity: 1 Electronic Signature(s) Signed: 04/23/2023 4:27:59 PM By: Jamie Corwin DO Entered By: Jamie [name] on 04/23/2023 13:01:02

## 2023-04-30 ENCOUNTER — Encounter (HOSPITAL_BASED_OUTPATIENT_CLINIC_OR_DEPARTMENT_OTHER): Payer: Medicare HMO | Admitting: Internal Medicine

## 2023-04-30 DIAGNOSIS — I87331 Chronic venous hypertension (idiopathic) with ulcer and inflammation of right lower extremity: Secondary | ICD-10-CM

## 2023-04-30 DIAGNOSIS — L97812 Non-pressure chronic ulcer of other part of right lower leg with fat layer exposed: Secondary | ICD-10-CM | POA: Diagnosis not present

## 2023-04-30 DIAGNOSIS — I2510 Atherosclerotic heart disease of native coronary artery without angina pectoris: Secondary | ICD-10-CM

## 2023-05-07 ENCOUNTER — Encounter (HOSPITAL_BASED_OUTPATIENT_CLINIC_OR_DEPARTMENT_OTHER): Payer: Medicare HMO | Admitting: Internal Medicine

## 2023-05-07 DIAGNOSIS — I87331 Chronic venous hypertension (idiopathic) with ulcer and inflammation of right lower extremity: Secondary | ICD-10-CM | POA: Diagnosis not present

## 2023-05-07 DIAGNOSIS — I2510 Atherosclerotic heart disease of native coronary artery without angina pectoris: Secondary | ICD-10-CM

## 2023-05-07 DIAGNOSIS — L97812 Non-pressure chronic ulcer of other part of right lower leg with fat layer exposed: Secondary | ICD-10-CM

## 2023-05-14 ENCOUNTER — Encounter (HOSPITAL_BASED_OUTPATIENT_CLINIC_OR_DEPARTMENT_OTHER): Payer: Medicare HMO | Attending: Internal Medicine | Admitting: Internal Medicine

## 2023-05-14 DIAGNOSIS — I87331 Chronic venous hypertension (idiopathic) with ulcer and inflammation of right lower extremity: Secondary | ICD-10-CM

## 2023-05-14 DIAGNOSIS — L97812 Non-pressure chronic ulcer of other part of right lower leg with fat layer exposed: Secondary | ICD-10-CM | POA: Diagnosis not present

## 2023-05-14 DIAGNOSIS — I2510 Atherosclerotic heart disease of native coronary artery without angina pectoris: Secondary | ICD-10-CM | POA: Diagnosis not present

## 2023-05-21 ENCOUNTER — Ambulatory Visit (HOSPITAL_BASED_OUTPATIENT_CLINIC_OR_DEPARTMENT_OTHER): Payer: Medicare HMO | Admitting: Internal Medicine

## 2023-05-28 ENCOUNTER — Encounter (HOSPITAL_BASED_OUTPATIENT_CLINIC_OR_DEPARTMENT_OTHER): Payer: Medicare HMO | Admitting: Internal Medicine

## 2023-06-04 ENCOUNTER — Encounter (HOSPITAL_BASED_OUTPATIENT_CLINIC_OR_DEPARTMENT_OTHER): Payer: Medicare HMO | Admitting: Internal Medicine

## 2023-06-04 DIAGNOSIS — I87331 Chronic venous hypertension (idiopathic) with ulcer and inflammation of right lower extremity: Secondary | ICD-10-CM | POA: Diagnosis not present

## 2023-06-04 DIAGNOSIS — L97812 Non-pressure chronic ulcer of other part of right lower leg with fat layer exposed: Secondary | ICD-10-CM | POA: Diagnosis not present

## 2023-06-12 ENCOUNTER — Encounter (HOSPITAL_BASED_OUTPATIENT_CLINIC_OR_DEPARTMENT_OTHER): Payer: Medicare HMO | Attending: Internal Medicine | Admitting: Internal Medicine

## 2023-06-12 DIAGNOSIS — I87331 Chronic venous hypertension (idiopathic) with ulcer and inflammation of right lower extremity: Secondary | ICD-10-CM

## 2023-06-12 DIAGNOSIS — I2510 Atherosclerotic heart disease of native coronary artery without angina pectoris: Secondary | ICD-10-CM

## 2023-06-12 DIAGNOSIS — L97812 Non-pressure chronic ulcer of other part of right lower leg with fat layer exposed: Secondary | ICD-10-CM | POA: Diagnosis not present

## 2023-06-18 ENCOUNTER — Encounter (HOSPITAL_BASED_OUTPATIENT_CLINIC_OR_DEPARTMENT_OTHER): Payer: Medicare HMO | Admitting: Internal Medicine

## 2023-06-18 DIAGNOSIS — L97812 Non-pressure chronic ulcer of other part of right lower leg with fat layer exposed: Secondary | ICD-10-CM

## 2023-06-18 DIAGNOSIS — I87331 Chronic venous hypertension (idiopathic) with ulcer and inflammation of right lower extremity: Secondary | ICD-10-CM

## 2023-06-25 ENCOUNTER — Encounter (HOSPITAL_BASED_OUTPATIENT_CLINIC_OR_DEPARTMENT_OTHER): Admitting: Internal Medicine

## 2023-06-26 ENCOUNTER — Encounter (HOSPITAL_BASED_OUTPATIENT_CLINIC_OR_DEPARTMENT_OTHER): Admitting: Internal Medicine

## 2023-06-26 DIAGNOSIS — I2510 Atherosclerotic heart disease of native coronary artery without angina pectoris: Secondary | ICD-10-CM

## 2023-06-26 DIAGNOSIS — L97812 Non-pressure chronic ulcer of other part of right lower leg with fat layer exposed: Secondary | ICD-10-CM | POA: Diagnosis not present

## 2023-06-26 DIAGNOSIS — I87331 Chronic venous hypertension (idiopathic) with ulcer and inflammation of right lower extremity: Secondary | ICD-10-CM | POA: Diagnosis not present
